# Patient Record
Sex: Female | Born: 1950 | Race: White | Hispanic: No | Marital: Married | Smoking: Never smoker
Health system: Southern US, Community
[De-identification: ages and names within clinical notes are randomized; demographics above are authoritative.]

## PROBLEM LIST (undated history)

## (undated) DIAGNOSIS — K219 Gastro-esophageal reflux disease without esophagitis: Secondary | ICD-10-CM

## (undated) DIAGNOSIS — Z9889 Other specified postprocedural states: Secondary | ICD-10-CM

## (undated) DIAGNOSIS — T7840XA Allergy, unspecified, initial encounter: Secondary | ICD-10-CM

## (undated) DIAGNOSIS — D219 Benign neoplasm of connective and other soft tissue, unspecified: Secondary | ICD-10-CM

## (undated) DIAGNOSIS — M1990 Unspecified osteoarthritis, unspecified site: Secondary | ICD-10-CM

## (undated) DIAGNOSIS — G500 Trigeminal neuralgia: Secondary | ICD-10-CM

## (undated) DIAGNOSIS — R112 Nausea with vomiting, unspecified: Secondary | ICD-10-CM

## (undated) DIAGNOSIS — N3010 Interstitial cystitis (chronic) without hematuria: Secondary | ICD-10-CM

## (undated) DIAGNOSIS — H9319 Tinnitus, unspecified ear: Secondary | ICD-10-CM

## (undated) HISTORY — DX: Interstitial cystitis (chronic) without hematuria: N30.10

## (undated) HISTORY — DX: Trigeminal neuralgia: G50.0

## (undated) HISTORY — DX: Allergy, unspecified, initial encounter: T78.40XA

## (undated) HISTORY — DX: Unspecified osteoarthritis, unspecified site: M19.90

## (undated) HISTORY — DX: Benign neoplasm of connective and other soft tissue, unspecified: D21.9

---

## 1978-11-27 HISTORY — PX: SEPTOPLASTY: SUR1290

## 1988-11-26 HISTORY — PX: OTHER SURGICAL HISTORY: SHX169

## 1997-08-27 ENCOUNTER — Other Ambulatory Visit: Admission: RE | Admit: 1997-08-27 | Discharge: 1997-08-27 | Payer: Self-pay | Admitting: Obstetrics and Gynecology

## 1997-09-19 ENCOUNTER — Ambulatory Visit (HOSPITAL_COMMUNITY): Admission: RE | Admit: 1997-09-19 | Discharge: 1997-09-19 | Payer: Self-pay | Admitting: Obstetrics and Gynecology

## 1998-03-28 HISTORY — PX: ABDOMINAL HYSTERECTOMY: SHX81

## 1998-09-18 ENCOUNTER — Other Ambulatory Visit: Admission: RE | Admit: 1998-09-18 | Discharge: 1998-09-18 | Payer: Self-pay | Admitting: Obstetrics and Gynecology

## 1998-11-03 ENCOUNTER — Encounter (INDEPENDENT_AMBULATORY_CARE_PROVIDER_SITE_OTHER): Payer: Self-pay | Admitting: Specialist

## 1998-11-03 ENCOUNTER — Inpatient Hospital Stay (HOSPITAL_COMMUNITY): Admission: RE | Admit: 1998-11-03 | Discharge: 1998-11-06 | Payer: Self-pay | Admitting: Obstetrics and Gynecology

## 2000-05-01 ENCOUNTER — Ambulatory Visit (HOSPITAL_COMMUNITY): Admission: RE | Admit: 2000-05-01 | Discharge: 2000-05-01 | Payer: Self-pay | Admitting: Gastroenterology

## 2000-12-08 ENCOUNTER — Ambulatory Visit (HOSPITAL_COMMUNITY): Admission: RE | Admit: 2000-12-08 | Discharge: 2000-12-08 | Payer: Self-pay | Admitting: Internal Medicine

## 2000-12-11 ENCOUNTER — Ambulatory Visit (HOSPITAL_COMMUNITY): Admission: RE | Admit: 2000-12-11 | Discharge: 2000-12-11 | Payer: Self-pay | Admitting: Internal Medicine

## 2003-09-05 ENCOUNTER — Emergency Department (HOSPITAL_COMMUNITY): Admission: EM | Admit: 2003-09-05 | Discharge: 2003-09-06 | Payer: Self-pay | Admitting: Emergency Medicine

## 2007-08-15 DIAGNOSIS — Z87898 Personal history of other specified conditions: Secondary | ICD-10-CM | POA: Insufficient documentation

## 2007-08-15 DIAGNOSIS — R05 Cough: Secondary | ICD-10-CM

## 2007-08-15 DIAGNOSIS — K219 Gastro-esophageal reflux disease without esophagitis: Secondary | ICD-10-CM | POA: Insufficient documentation

## 2007-08-15 DIAGNOSIS — R059 Cough, unspecified: Secondary | ICD-10-CM | POA: Insufficient documentation

## 2007-08-16 ENCOUNTER — Ambulatory Visit: Payer: Self-pay | Admitting: Emergency Medicine

## 2007-08-16 DIAGNOSIS — J309 Allergic rhinitis, unspecified: Secondary | ICD-10-CM | POA: Insufficient documentation

## 2007-11-28 ENCOUNTER — Ambulatory Visit: Payer: Self-pay | Admitting: Emergency Medicine

## 2007-12-04 ENCOUNTER — Ambulatory Visit: Payer: Self-pay | Admitting: Emergency Medicine

## 2007-12-14 ENCOUNTER — Encounter: Admission: RE | Admit: 2007-12-14 | Discharge: 2007-12-14 | Payer: Self-pay | Admitting: Unknown Physician Specialty

## 2010-02-22 ENCOUNTER — Encounter: Admission: RE | Admit: 2010-02-22 | Discharge: 2010-02-22 | Payer: Self-pay | Admitting: Allergy

## 2010-03-08 ENCOUNTER — Emergency Department (HOSPITAL_COMMUNITY)
Admission: EM | Admit: 2010-03-08 | Discharge: 2010-03-08 | Payer: Self-pay | Source: Home / Self Care | Admitting: Emergency Medicine

## 2010-03-28 HISTORY — PX: ETHMOIDECTOMY: SHX5197

## 2011-02-28 ENCOUNTER — Other Ambulatory Visit (HOSPITAL_COMMUNITY): Payer: Self-pay | Admitting: *Deleted

## 2011-03-03 ENCOUNTER — Encounter (HOSPITAL_COMMUNITY): Payer: Self-pay

## 2011-03-09 ENCOUNTER — Encounter (HOSPITAL_COMMUNITY): Payer: Self-pay

## 2011-03-11 ENCOUNTER — Ambulatory Visit (HOSPITAL_COMMUNITY)
Admission: RE | Admit: 2011-03-11 | Discharge: 2011-03-11 | Disposition: A | Discharge status: Home / Self Care | Payer: PRIVATE HEALTH INSURANCE | Source: Ambulatory Visit | Attending: Family Medicine | Admitting: Family Medicine

## 2011-03-11 ENCOUNTER — Encounter (HOSPITAL_COMMUNITY): Payer: Self-pay

## 2011-03-11 DIAGNOSIS — M810 Age-related osteoporosis without current pathological fracture: Secondary | ICD-10-CM | POA: Insufficient documentation

## 2011-03-11 MED ORDER — ZOLEDRONIC ACID 5 MG/100ML IV SOLN: 5.0000 mg | Freq: Once | INTRAVENOUS | Status: DC

## 2011-03-11 NOTE — Progress Notes (Signed)
PT decided that after hearing reclast can cause body aches that she does not want to take the medicine.  Called Dr. Kathi Der office and spoke with Dierdre Highman who then spoke with Dr. Modesto Charon who stated to let the pt know to call Tammy in the office and they will see what else they can do for her.  I updated the pt and she said she would call the office when she left.

## 2011-03-25 ENCOUNTER — Other Ambulatory Visit: Payer: Self-pay | Admitting: Allergy and Immunology

## 2011-03-25 ENCOUNTER — Ambulatory Visit
Admission: RE | Admit: 2011-03-25 | Discharge: 2011-03-25 | Disposition: A | Discharge status: Home / Self Care | Payer: PRIVATE HEALTH INSURANCE | Source: Ambulatory Visit | Attending: Allergy and Immunology | Admitting: Allergy and Immunology

## 2011-03-25 DIAGNOSIS — R05 Cough: Secondary | ICD-10-CM

## 2011-03-25 DIAGNOSIS — R059 Cough, unspecified: Secondary | ICD-10-CM

## 2011-05-13 ENCOUNTER — Other Ambulatory Visit: Payer: Self-pay | Admitting: Allergy and Immunology

## 2011-05-13 ENCOUNTER — Ambulatory Visit
Admission: RE | Admit: 2011-05-13 | Discharge: 2011-05-13 | Disposition: A | Discharge status: Home / Self Care | Payer: PRIVATE HEALTH INSURANCE | Source: Ambulatory Visit | Attending: Allergy and Immunology | Admitting: Allergy and Immunology

## 2011-05-13 DIAGNOSIS — J329 Chronic sinusitis, unspecified: Secondary | ICD-10-CM

## 2011-06-06 ENCOUNTER — Encounter: Payer: Self-pay | Admitting: Neurology

## 2011-06-23 ENCOUNTER — Encounter (HOSPITAL_COMMUNITY): Payer: Self-pay | Admitting: Emergency Medicine

## 2011-06-23 ENCOUNTER — Emergency Department (HOSPITAL_COMMUNITY)
Admission: EM | Admit: 2011-06-23 | Discharge: 2011-06-23 | Disposition: A | Discharge status: Home / Self Care | Payer: Medicare Other | Attending: Emergency Medicine | Admitting: Emergency Medicine

## 2011-06-23 DIAGNOSIS — K219 Gastro-esophageal reflux disease without esophagitis: Secondary | ICD-10-CM | POA: Insufficient documentation

## 2011-06-23 DIAGNOSIS — K5289 Other specified noninfective gastroenteritis and colitis: Secondary | ICD-10-CM | POA: Insufficient documentation

## 2011-06-23 DIAGNOSIS — J45909 Unspecified asthma, uncomplicated: Secondary | ICD-10-CM | POA: Insufficient documentation

## 2011-06-23 DIAGNOSIS — M810 Age-related osteoporosis without current pathological fracture: Secondary | ICD-10-CM | POA: Insufficient documentation

## 2011-06-23 DIAGNOSIS — E876 Hypokalemia: Secondary | ICD-10-CM | POA: Insufficient documentation

## 2011-06-23 DIAGNOSIS — K529 Noninfective gastroenteritis and colitis, unspecified: Secondary | ICD-10-CM

## 2011-06-23 DIAGNOSIS — D72829 Elevated white blood cell count, unspecified: Secondary | ICD-10-CM | POA: Insufficient documentation

## 2011-06-23 HISTORY — DX: Gastro-esophageal reflux disease without esophagitis: K21.9

## 2011-06-23 LAB — COMPREHENSIVE METABOLIC PANEL
ALT: 14 U/L (ref 0–35)
Albumin: 4.5 g/dL (ref 3.5–5.2)
Alkaline Phosphatase: 80 U/L (ref 39–117)
BUN: 15 mg/dL (ref 6–23)
Calcium: 10.1 mg/dL (ref 8.4–10.5)
GFR calc Af Amer: >90 mL/min (ref >90)
Potassium: 3.2 mEq/L — ABNORMAL LOW (ref 3.5–5.1)
Sodium: 140 mEq/L (ref 135–145)
Total Protein: 7.6 g/dL (ref 6.0–8.3)

## 2011-06-23 LAB — CBC
MCH: 29.5 pg (ref 26.0–34.0)
MCHC: 32.2 g/dL (ref 30.0–36.0)
MCV: 91.6 fL (ref 78.0–100.0)
Platelets: 195 10*3/uL (ref 150–400)

## 2011-06-23 LAB — DIFFERENTIAL
Basophils Relative: 0 % (ref 0–1)
Eosinophils Absolute: 0.3 10*3/uL (ref 0.0–0.7)
Eosinophils Relative: 2 % (ref 0–5)
Neutrophils Relative %: 83 % — ABNORMAL HIGH (ref 43–77)

## 2011-06-23 LAB — LIPASE, BLOOD: Lipase: 49 U/L (ref 11–59)

## 2011-06-23 MED ORDER — LOPERAMIDE HCL 2 MG PO CAPS: 2.0000 mg | ORAL_CAPSULE | Freq: Four times a day (QID) | ORAL | Status: AC | PRN

## 2011-06-23 MED ORDER — ONDANSETRON HCL 4 MG/2ML IJ SOLN
4.0000 mg | Freq: Once | INTRAMUSCULAR | Status: AC
  Administered 2011-06-23: 4 mg via INTRAVENOUS
  Filled 2011-06-23: qty 2

## 2011-06-23 MED ORDER — SODIUM CHLORIDE 0.9 % IV BOLUS (SEPSIS)
1000.0000 mL | Freq: Once | INTRAVENOUS | Status: AC
  Administered 2011-06-23: 1000 mL via INTRAVENOUS

## 2011-06-23 MED ORDER — ONDANSETRON HCL 4 MG PO TABS: 4.0000 mg | ORAL_TABLET | Freq: Four times a day (QID) | ORAL | Status: AC

## 2011-06-23 MED ORDER — LOPERAMIDE HCL 2 MG PO CAPS
2.0000 mg | ORAL_CAPSULE | Freq: Once | ORAL | Status: AC
  Administered 2011-06-23: 2 mg via ORAL
  Filled 2011-06-23: qty 1

## 2011-06-23 NOTE — ED Provider Notes (Signed)
History     CSN: [id number]  Arrival date & time 06/23/11  0009   First MD Initiated Contact with Patient 06/23/11 0118      Chief Complaint  Patient presents with  . Emesis  . Diarrhea    (Consider location/radiation/quality/duration/timing/severity/associated sxs/prior treatment) HPI Comments: Patient here after developing acute onset of nausea, vomiting, diarrhea and crampy abdominal pain - states she is staying upstairs with her husband who is undergoing chemotherapy - states that she felt well today, ate well.  States this evening she was in the sleeping chair when she felt a wave of nausea and went to the bathroom and vomited NBNB vomitus - she reports felt a little better and went back to the chair when another wave hit her and she vomited again.  She states that she then left the room, talked to the nurse upstairs and went to a different bathroom, states had one normal bowel movement, then proceeded to have diarrhea - she denies fever but reports feeling chilled.  Reports diffuse crampy abdominal pain just before episodes of vomiting or diarrhea - she denies any real sick contacts and no one else in the family is ill.  Patient is a 61 y.o. female presenting with vomiting and diarrhea. The history is provided by the patient. No language interpreter was used.  Emesis  This is a new problem. The current episode started 1 to 2 hours ago. The problem occurs 2 to 4 times per day. The problem has not changed since onset.The emesis has an appearance of stomach contents. There has been no fever. Associated symptoms include abdominal pain, chills and diarrhea. Pertinent negatives include no arthralgias, no cough, no fever, no headaches, no myalgias, no sweats and no URI.  Diarrhea The primary symptoms include abdominal pain, nausea, vomiting and diarrhea. Primary symptoms do not include fever, weight loss, fatigue, melena, hematemesis, jaundice, hematochezia, dysuria, myalgias, arthralgias or  rash. The illness began today. The onset was sudden. The problem has not changed since onset. The illness is also significant for chills.    Past Medical History  Diagnosis Date  . Asthma   . Osteoporosis   . Acid reflux     Past Surgical History  Procedure Date  . Abdominal hysterectomy   . Vocal cord surgery    . Septoidectomy    . Ethmoidectomy   . Septoplasty     Family History  Problem Relation Age of Onset  . Hyperlipidemia Mother   . Cancer Father     History  Substance Use Topics  . Smoking status: Never Smoker   . Smokeless tobacco: Not on file  . Alcohol Use: No    OB History    Grav Para Term Preterm Abortions TAB SAB Ect Mult Living                  Review of Systems  Constitutional: Positive for chills. Negative for fever, weight loss and fatigue.  Respiratory: Negative for cough.   Gastrointestinal: Positive for nausea, vomiting, abdominal pain and diarrhea. Negative for melena, hematochezia, hematemesis and jaundice.  Genitourinary: Negative for dysuria.  Musculoskeletal: Negative for myalgias and arthralgias.  Skin: Negative for rash.  Neurological: Negative for headaches.  All other systems reviewed and are negative.    Allergies  Avelox; Clindamycin/lincomycin; Levaquin; and Sulfonamide derivatives  Home Medications   Current Outpatient Rx  Name Route Sig Dispense Refill  . CITRACAL CALCIUM+D PO Oral Take by mouth.    [name] FLUTICASONE PROPIONATE 50 MCG/ACT  NA SUSP Nasal Place 2 sprays into the nose daily.    . IBUPROFEN 200 MG PO TABS Oral Take 400 mg by mouth every 6 (six) hours as needed.    [name] PANTOPRAZOLE SODIUM 40 MG PO TBEC Oral Take 40 mg by mouth daily.      BP 106/77  Pulse 82  Temp(Src) 97.8 F (36.6 C) (Oral)  Resp 20  SpO2 100%  Physical Exam  Nursing note and vitals reviewed. Constitutional: She is oriented to person, place, and time. She appears well-developed and well-nourished. No distress.  HENT:  Head:  Normocephalic and atraumatic.  Right Ear: External ear normal.  Left Ear: External ear normal.  Nose: Nose normal.  Mouth/Throat: Oropharynx is clear and moist. No oropharyngeal exudate.  Eyes: Conjunctivae are normal. Pupils are equal, round, and reactive to light. No scleral icterus.  Neck: Normal range of motion. Neck supple.  Cardiovascular: Normal rate, regular rhythm and normal heart sounds.  Exam reveals no gallop and no friction rub.   No murmur heard. Pulmonary/Chest: Effort normal and breath sounds normal. No respiratory distress. She exhibits no tenderness.  Abdominal: Soft. Bowel sounds are normal. She exhibits no distension. There is no tenderness.  Musculoskeletal: Normal range of motion. She exhibits no edema and no tenderness.  Lymphadenopathy:    She has no cervical adenopathy.  Neurological: She is alert and oriented to person, place, and time. No cranial nerve deficit.  Skin: Skin is warm and dry. No rash noted. No erythema. No pallor.  Psychiatric: She has a normal mood and affect. Her behavior is normal. Judgment and thought content normal.    ED Course  Procedures (including critical care time)   Labs Reviewed  CBC  DIFFERENTIAL  COMPREHENSIVE METABOLIC PANEL  LIPASE, BLOOD   No results found. Results for orders placed during the hospital encounter of 06/23/11  CBC      Component Value Range   WBC 12.2 (*) 4.0 - 10.5 (K/uL)   RBC 4.04  3.87 - 5.11 (MIL/uL)   Hemoglobin 11.9 (*) 12.0 - 15.0 (g/dL)   HCT 62.9  52.8 - 41.3 (%)   MCV 91.6  78.0 - 100.0 (fL)   MCH 29.5  26.0 - 34.0 (pg)   MCHC 32.2  30.0 - 36.0 (g/dL)   RDW 24.4  01.0 - 27.2 (%)   Platelets 195  150 - 400 (K/uL)  DIFFERENTIAL      Component Value Range   Neutrophils Relative 83 (*) 43 - 77 (%)   Neutro Abs 10.1 (*) 1.7 - 7.7 (K/uL)   Lymphocytes Relative 6 (*) 12 - 46 (%)   Lymphs Abs 0.8  0.7 - 4.0 (K/uL)   Monocytes Relative 8  3 - 12 (%)   Monocytes Absolute 1.0  0.1 - 1.0 (K/uL)     Eosinophils Relative 2  0 - 5 (%)   Eosinophils Absolute 0.3  0.0 - 0.7 (K/uL)   Basophils Relative 0  0 - 1 (%)   Basophils Absolute 0.0  0.0 - 0.1 (K/uL)  COMPREHENSIVE METABOLIC PANEL      Component Value Range   Sodium 140  135 - 145 (mEq/L)   Potassium 3.2 (*) 3.5 - 5.1 (mEq/L)   Chloride 102  96 - 112 (mEq/L)   CO2 25  19 - 32 (mEq/L)   Glucose, Bld 123 (*) 70 - 99 (mg/dL)   BUN 15  6 - 23 (mg/dL)   Creatinine, Ser 5.36  0.50 - 1.10 (mg/dL)  Calcium 10.1  8.4 - 10.5 (mg/dL)   Total Protein 7.6  6.0 - 8.3 (g/dL)   Albumin 4.5  3.5 - 5.2 (g/dL)   AST 28  0 - 37 (U/L)   ALT 14  0 - 35 (U/L)   Alkaline Phosphatase 80  39 - 117 (U/L)   Total Bilirubin 0.3  0.3 - 1.2 (mg/dL)   GFR calc non Af Amer >90  >90 (mL/min)   GFR calc Af Amer >90  >90 (mL/min)  LIPASE, BLOOD      Component Value Range   Lipase 49  11 - 59 (U/L)   No results found.   Gastroenteritis    MDM  Patient with basically normal labs with the exception of mild leukocytosis and hypokalemia.  Patient with only crampy abdominal pain with a soft and benign abdominal examination so I do not feel that imaging is warranted.  Patient reports feeling better after IV fluids and zofran for nausea - repeat abdominal exam without tenderness.        Izola Price [place], [place] 06/23/11 [phone]

## 2011-06-23 NOTE — Discharge Instructions (Signed)
Diet for Diarrhea, Adult Having frequent, runny stools (diarrhea) has many causes. Diarrhea may be caused or worsened by food or drink. Diarrhea may be relieved by changing your diet. IF YOU ARE NOT TOLERATING SOLID FOODS:  Drink enough water and fluids to keep your urine clear or pale yellow.   Avoid sugary drinks and sodas as well as milk-based beverages.   Avoid beverages containing caffeine and alcohol.   You may try rehydrating beverages. You can make your own by following this recipe:    tsp table salt.    tsp baking soda.   ? tsp salt substitute (potassium chloride).   1 tbs + 1 tsp sugar.   1 qt water.  As your stools become more solid, you can start eating solid foods. Add foods one at a time. If a certain food causes your diarrhea to get worse, avoid that food and try other foods. A low fiber, low-fat, and lactose-free diet is recommended. Small, frequent meals may be better tolerated.  Starches  Allowed:  White, French, and pita breads, plain rolls, buns, bagels. Plain muffins, matzo. Soda, saltine, or graham crackers. Pretzels, melba toast, zwieback. Cooked cereals made with water: cornmeal, farina, cream cereals. Dry cereals: refined corn, wheat, rice. Potatoes prepared any way without skins, refined macaroni, spaghetti, noodles, refined rice.   Avoid:  Bread, rolls, or crackers made with whole wheat, multi-grains, rye, bran seeds, nuts, or coconut. Corn tortillas or taco shells. Cereals containing whole grains, multi-grains, bran, coconut, nuts, or raisins. Cooked or dry oatmeal. Coarse wheat cereals, granola. Cereals advertised as "high-fiber." Potato skins. Whole grain pasta, wild or brown rice. Popcorn. Sweet potatoes/yams. Sweet rolls, doughnuts, waffles, pancakes, sweet breads.  Vegetables  Allowed: Strained tomato and vegetable juices. Most well-cooked and canned vegetables without seeds. Fresh: Tender lettuce, cucumber without the skin, cabbage, spinach, bean  sprouts.   Avoid: Fresh, cooked, or canned: Artichokes, baked beans, beet greens, broccoli, Brussels sprouts, corn, kale, legumes, peas, sweet potatoes. Cooked: Green or red cabbage, spinach. Avoid large servings of any vegetables, because vegetables shrink when cooked, and they contain more fiber per serving than fresh vegetables.  Fruit  Allowed: All fruit juices except prune juice. Cooked or canned: Apricots, applesauce, cantaloupe, cherries, fruit cocktail, grapefruit, grapes, kiwi, mandarin oranges, peaches, pears, plums, watermelon. Fresh: Apples without skin, ripe banana, grapes, cantaloupe, cherries, grapefruit, peaches, oranges, plums. Keep servings limited to  cup or 1 piece.   Avoid: Fresh: Apple with skin, apricots, mango, pears, raspberries, strawberries. Prune juice, stewed or dried prunes. Dried fruits, raisins, dates. Large servings of all fresh fruits.  Meat and Meat Substitutes  Allowed: Ground or well-cooked tender beef, ham, veal, lamb, pork, or poultry. Eggs, plain cheese. Fish, oysters, shrimp, lobster, other seafoods. Liver, organ meats.   Avoid: Tough, fibrous meats with gristle. Peanut butter, smooth or chunky. Cheese, nuts, seeds, legumes, dried peas, beans, lentils.  Milk  Allowed: Yogurt, lactose-free milk, kefir, drinkable yogurt, buttermilk, soy milk.   Avoid: Milk, chocolate milk, beverages made with milk, such as milk shakes.  Soups  Allowed: Bouillon, broth, or soups made from allowed foods. Any strained soup.   Avoid: Soups made from vegetables that are not allowed, cream or milk-based soups.  Desserts and Sweets  Allowed: Sugar-free gelatin, sugar-free frozen ice pops made without sugar alcohol.   Avoid: Plain cakes and cookies, pie made with allowed fruit, pudding, custard, cream pie. Gelatin, fruit, ice, sherbet, frozen ice pops. Ice cream, ice milk without nuts. Plain hard candy,   honey, jelly, molasses, syrup, sugar, chocolate syrup, gumdrops,  marshmallows.  Fats and Oils  Allowed: Avoid any fats and oils.   Avoid: Seeds, nuts, olives, avocados. Margarine, butter, cream, mayonnaise, salad oils, plain salad dressings made from allowed foods. Plain gravy, crisp bacon without rind.  Beverages  Allowed: Water, decaffeinated teas, oral rehydration solutions, sugar-free beverages.   Avoid: Fruit juices, caffeinated beverages (coffee, tea, soda or pop), alcohol, sports drinks, or lemon-lime soda or pop.  Condiments  Allowed: Ketchup, mustard, horseradish, vinegar, cream sauce, cheese sauce, cocoa powder. Spices in moderation: allspice, basil, bay leaves, celery powder or leaves, cinnamon, cumin powder, curry powder, ginger, mace, marjoram, onion or garlic powder, oregano, paprika, parsley flakes, ground pepper, rosemary, sage, savory, tarragon, thyme, turmeric.   Avoid: Coconut, honey.  Weight Monitoring: Weigh yourself every day. You should weigh yourself in the morning after you urinate and before you eat breakfast. Wear the same amount of clothing when you weigh yourself. Record your weight daily. Bring your recorded weights to your clinic visits. Tell your caregiver right away if you have gained 3 lb/1.4 kg or more in 1 day, 5 lb/2.3 kg in a week, or whatever amount you were told to report. SEEK IMMEDIATE MEDICAL CARE IF:   You are unable to keep fluids down.   You start to throw up (vomit) or diarrhea keeps coming back (persistent).   Abdominal pain develops, increases, or can be felt in one place (localizes).   You have an oral temperature above 102 F (38.9 C), not controlled by medicine.   Diarrhea contains blood or mucus.   You develop excessive weakness, dizziness, fainting, or extreme thirst.  MAKE SURE YOU:   Understand these instructions.   Will watch your condition.   Will get help right away if you are not doing well or get worse.  Document Released: 06/04/2003 Document Revised: 03/03/2011 Document Reviewed:  09/25/2008 [facility] Patient Information 2012 [place], [place].Clear Liquid Diet The clear liquid dietconsists of foods that are liquid or will become liquid at room temperature.You should be able to see through the liquid and beverages. Examples of foods allowed on a clear liquid diet include fruit juice, broth or bouillon, gelatin, or frozen ice pops. The purpose of this diet is to provide necessary fluid, electrolytes such as sodium and potassium, and energy to keep the body functioning during times when you are not able to consume a regular diet.A clear liquid diet should not be continued for long periods of time as it is not nutritionally adequate.  REASONS FOR USING A CLEAR LIQUID DIET  In sudden onset (acute) conditions for a patient before or after surgery.   As the first step in oral feeding.   For fluid and electrolyte replacement in diarrheal diseases.   As a diet before certain medical tests are performed.  ADEQUACY The clear liquid diet is adequate only in ascorbic acid, according to the Recommended Dietary Allowances of the [employer]. CHOOSING FOODS Breads and Starches  Allowed:  None are allowed.   Avoid: All are avoided.  Vegetables  Allowed:  Strained tomato or vegetable juice.   Avoid: Any others.  Fruit  Allowed:  Strained fruit juices and fruit drinks. Include 1 serving of citrus or vitamin C-enriched fruit juice daily.   Avoid: Any others.  Meat and Meat Substitutes  Allowed:  None are allowed.   Avoid: All are avoided.  Milk  Allowed:  None are allowed.   Avoid: All are avoided.  Soups and  Combination Foods  Allowed:  Clear bouillon, broth, or strained broth-based soups.   Avoid: Any others.  Desserts and Sweets  Allowed:  Sugar, honey. High protein gelatin. Flavored gelatin, ices, or frozen ice pops that do not contain milk.   Avoid: Any others.  Fats and Oils  Allowed:  None are allowed.   Avoid: All are avoided.    Beverages  Allowed: Cereal beverages, coffee (regular or decaffeinated), tea, or soda at the discretion of your caregiver.   Avoid: Any others.  Condiments  Allowed:  Iodized salt.   Avoid: Any others, including pepper.  Supplements  Allowed:  Liquid nutrition beverages.   Avoid: Any others that contain lactose or fiber.  SAMPLE MEAL PLAN Breakfast  4 oz (120 mL) strained orange juice.    to 1 cup (125 to 250 mL) gelatin (plain or fortified).   1 cup (250 mL) beverage (coffee or tea).   Sugar, if desired.  Midmorning Snack   cup (125 mL) gelatin (plain or fortified).  Lunch  1 cup (250 mL) broth or consomm.   4 oz (120 mL) strained grapefruit juice.    cup (125 mL) gelatin (plain or fortified).   1 cup (250 mL) beverage (coffee or tea).   Sugar, if desired.  Midafternoon Snack   cup (125 mL) fruit ice.    cup (125 mL) strained fruit juice.  Dinner  1 cup (250 mL) broth or consomm.    cup (125 mL) cranberry juice.    cup (125 mL) flavored gelatin (plain or fortified).   1 cup (250 mL) beverage (coffee or tea).   Sugar, if desired.  Evening Snack  4 oz (120 mL) strained apple juice (vitamin C-fortified).    cup (125 mL) flavored gelatin (plain or fortified).  Document Released: 03/14/2005 Document Revised: 03/03/2011 Document Reviewed: 06/11/2010 [facility] Patient Information 2012 [place], [place].Viral Gastroenteritis Viral gastroenteritis is also called stomach flu. This illness is caused by a certain type of germ (virus). It can cause sudden watery poop (diarrhea) and throwing up (vomiting). This can cause you to lose body fluids (dehydration). This illness usually lasts for 3 to 8 days. It usually goes away on its own. HOME CARE   Drink enough fluids to keep your pee (urine) clear or pale yellow. Drink small amounts of fluids often.   Ask your doctor how to replace body fluid losses (rehydration).   Avoid:   Foods high in sugar.    Alcohol.   Bubbly (carbonated) drinks.   Tobacco.   Juice.   Caffeine drinks.   Very hot or cold fluids.   Fatty, greasy foods.   Eating too much at one time.   Dairy products until 24 to 48 hours after your watery poop stops.   You may eat foods with active cultures (probiotics). They can be found in some yogurts and supplements.   Wash your hands well to avoid spreading the illness.   Only take medicines as told by your doctor. Do not give aspirin to children. Do not take medicines for watery poop (antidiarrheals).   Ask your doctor if you should keep taking your regular medicines.   Keep all doctor visits as told.  GET HELP RIGHT AWAY IF:   You cannot keep fluids down.   You do not pee at least once every 6 to 8 hours.   You are short of breath.   You see blood in your poop or throw up. This may look like coffee grounds.  You have belly (abdominal) pain that gets worse or is just in one small spot (localized).   You keep throwing up or having watery poop.   You have a fever.   The patient is a child younger than 3 months, and he or she has a fever.   The patient is a child older than 3 months, and he or she has a fever and problems that do not go away.   The patient is a child older than 3 months, and he or she has a fever and problems that suddenly get worse.   The patient is a baby, and he or she has no tears when crying.  MAKE SURE YOU:   Understand these instructions.   Will watch your condition.   Will get help right away if you are not doing well or get worse.  Document Released: 08/31/2007 Document Revised: 03/03/2011 Document Reviewed: 12/29/2010 [facility] Patient Information 2012 [place], [place].

## 2011-06-23 NOTE — ED Notes (Signed)
Pt states tonight around 9pm she started having nausea, vomiting, and diarrhea

## 2011-06-23 NOTE — ED Provider Notes (Signed)
Medical screening examination/treatment/procedure(s) were performed by non-physician practitioner and as supervising physician I was immediately available for consultation/collaboration.  Jasmine Awe, MD 06/23/11 [phone]

## 2011-07-18 ENCOUNTER — Ambulatory Visit: Payer: PRIVATE HEALTH INSURANCE | Admitting: Neurology

## 2012-05-16 ENCOUNTER — Other Ambulatory Visit: Payer: Self-pay | Admitting: Urology

## 2012-05-17 MED ORDER — TRIAMCINOLONE ACETONIDE 40 MG/ML IJ SUSP: Freq: Once | INTRAMUSCULAR | Status: DC

## 2012-05-17 MED ORDER — PHENAZOPYRIDINE HCL 200 MG PO TABS: Freq: Once | ORAL | Status: DC

## 2012-06-05 ENCOUNTER — Encounter (HOSPITAL_BASED_OUTPATIENT_CLINIC_OR_DEPARTMENT_OTHER): Payer: Self-pay | Admitting: *Deleted

## 2012-06-05 NOTE — Progress Notes (Addendum)
To [facility] at 1000-Hg,Ekg(none on file) on arrival.Npo after Mn-instructed to take protonix with small amt water only that am.

## 2012-06-11 ENCOUNTER — Ambulatory Visit (HOSPITAL_BASED_OUTPATIENT_CLINIC_OR_DEPARTMENT_OTHER): Payer: PRIVATE HEALTH INSURANCE | Admitting: Anesthesiology

## 2012-06-11 ENCOUNTER — Encounter (HOSPITAL_BASED_OUTPATIENT_CLINIC_OR_DEPARTMENT_OTHER): Payer: Self-pay

## 2012-06-11 ENCOUNTER — Encounter (HOSPITAL_BASED_OUTPATIENT_CLINIC_OR_DEPARTMENT_OTHER): Payer: Self-pay | Admitting: Anesthesiology

## 2012-06-11 ENCOUNTER — Ambulatory Visit (HOSPITAL_BASED_OUTPATIENT_CLINIC_OR_DEPARTMENT_OTHER)
Admission: RE | Admit: 2012-06-11 | Discharge: 2012-06-11 | Disposition: A | Discharge status: Home / Self Care | Payer: PRIVATE HEALTH INSURANCE | Source: Ambulatory Visit | Attending: Urology | Admitting: Urology

## 2012-06-11 ENCOUNTER — Encounter (HOSPITAL_BASED_OUTPATIENT_CLINIC_OR_DEPARTMENT_OTHER)
Admission: RE | Disposition: A | Discharge status: Home / Self Care | Payer: Self-pay | Source: Ambulatory Visit | Attending: Urology

## 2012-06-11 DIAGNOSIS — J45909 Unspecified asthma, uncomplicated: Secondary | ICD-10-CM | POA: Insufficient documentation

## 2012-06-11 DIAGNOSIS — Z79899 Other long term (current) drug therapy: Secondary | ICD-10-CM | POA: Insufficient documentation

## 2012-06-11 DIAGNOSIS — K219 Gastro-esophageal reflux disease without esophagitis: Secondary | ICD-10-CM | POA: Insufficient documentation

## 2012-06-11 DIAGNOSIS — N3010 Interstitial cystitis (chronic) without hematuria: Secondary | ICD-10-CM | POA: Insufficient documentation

## 2012-06-11 DIAGNOSIS — Z01810 Encounter for preprocedural cardiovascular examination: Secondary | ICD-10-CM | POA: Insufficient documentation

## 2012-06-11 HISTORY — DX: Tinnitus, unspecified ear: H93.19

## 2012-06-11 HISTORY — DX: Other specified postprocedural states: Z98.890

## 2012-06-11 HISTORY — DX: Nausea with vomiting, unspecified: R11.2

## 2012-06-11 HISTORY — PX: CYSTOSCOPY WITH HYDRODISTENSION AND BIOPSY: SHX5127

## 2012-06-11 LAB — POCT HEMOGLOBIN-HEMACUE: Hemoglobin: 12 g/dL (ref 12.0–15.0)

## 2012-06-11 SURGERY — CYSTOSCOPY, WITH BLADDER HYDRODISTENSION AND BIOPSY
Anesthesia: General | Site: Bladder | Wound class: Clean Contaminated

## 2012-06-11 MED ORDER — CEFAZOLIN SODIUM 1-5 GM-% IV SOLN
1.0000 g | INTRAVENOUS | Status: DC
  Filled 2012-06-11: qty 50

## 2012-06-11 MED ORDER — TRIAMCINOLONE ACETONIDE 40 MG/ML IJ SUSP
INTRAMUSCULAR | Status: DC | PRN
  Administered 2012-06-11: 40 mg via INTRAMUSCULAR

## 2012-06-11 MED ORDER — KETOROLAC TROMETHAMINE 30 MG/ML IJ SOLN
INTRAMUSCULAR | Status: DC | PRN
  Administered 2012-06-11: 30 mg via INTRAVENOUS

## 2012-06-11 MED ORDER — STERILE WATER FOR IRRIGATION IR SOLN
Status: DC | PRN
  Administered 2012-06-11: 1000 mL

## 2012-06-11 MED ORDER — POLYETHYLENE GLYCOL 3350 17 G PO PACK: PACK | ORAL | Status: DC

## 2012-06-11 MED ORDER — MIDAZOLAM HCL 5 MG/5ML IJ SOLN
INTRAMUSCULAR | Status: DC | PRN
  Administered 2012-06-11: 2 mg via INTRAVENOUS

## 2012-06-11 MED ORDER — PENTOSAN POLYSULFATE SODIUM 100 MG PO CAPS: 100.0000 mg | ORAL_CAPSULE | Freq: Three times a day (TID) | ORAL | Status: DC

## 2012-06-11 MED ORDER — HYDROXYZINE HCL 10 MG PO TABS: 10.0000 mg | ORAL_TABLET | Freq: Three times a day (TID) | ORAL | Status: DC | PRN

## 2012-06-11 MED ORDER — DEXAMETHASONE SODIUM PHOSPHATE 10 MG/ML IJ SOLN
INTRAMUSCULAR | Status: DC | PRN
  Administered 2012-06-11: 8 mg via INTRAVENOUS

## 2012-06-11 MED ORDER — ACETAMINOPHEN 10 MG/ML IV SOLN
INTRAVENOUS | Status: DC | PRN
  Administered 2012-06-11: 1000 mg via INTRAVENOUS

## 2012-06-11 MED ORDER — TRIMETHOPRIM 100 MG PO TABS: 100.0000 mg | ORAL_TABLET | ORAL | Status: DC

## 2012-06-11 MED ORDER — URELLE 81 MG PO TABS: 1.0000 | ORAL_TABLET | Freq: Three times a day (TID) | ORAL | Status: DC

## 2012-06-11 MED ORDER — CIPROFLOXACIN IN D5W 400 MG/200ML IV SOLN
400.0000 mg | Freq: Two times a day (BID) | INTRAVENOUS | Status: DC
  Administered 2012-06-11: 400 mg via INTRAVENOUS
  Filled 2012-06-11: qty 200

## 2012-06-11 MED ORDER — CALCIUM GLYCEROPHOSPHATE 340 (65-50) MG (CA-P) PO TABS: 1.0000 | ORAL_TABLET | Freq: Three times a day (TID) | ORAL | Status: DC

## 2012-06-11 MED ORDER — LACTATED RINGERS IV SOLN
INTRAVENOUS | Status: DC
  Administered 2012-06-11 (×2): via INTRAVENOUS
  Filled 2012-06-11: qty 1000

## 2012-06-11 MED ORDER — CEFAZOLIN SODIUM-DEXTROSE 2-3 GM-% IV SOLR
2.0000 g | INTRAVENOUS | Status: DC
  Filled 2012-06-11: qty 50

## 2012-06-11 MED ORDER — LACTATED RINGERS IV SOLN
INTRAVENOUS | Status: DC
  Filled 2012-06-11: qty 1000

## 2012-06-11 MED ORDER — HYDROCODONE-IBUPROFEN 5-200 MG PO TABS: 1.0000 | ORAL_TABLET | Freq: Four times a day (QID) | ORAL | Status: DC | PRN

## 2012-06-11 MED ORDER — FENTANYL CITRATE 0.05 MG/ML IJ SOLN
25.0000 ug | INTRAMUSCULAR | Status: DC | PRN
  Filled 2012-06-11: qty 1

## 2012-06-11 MED ORDER — ONDANSETRON HCL 4 MG/2ML IJ SOLN
INTRAMUSCULAR | Status: DC | PRN
  Administered 2012-06-11: 4 mg via INTRAVENOUS

## 2012-06-11 MED ORDER — PROPOFOL 10 MG/ML IV BOLUS
INTRAVENOUS | Status: DC | PRN
  Administered 2012-06-11: 160 mg via INTRAVENOUS

## 2012-06-11 MED ORDER — LIDOCAINE HCL (CARDIAC) 20 MG/ML IV SOLN
INTRAVENOUS | Status: DC | PRN
  Administered 2012-06-11: 60 mg via INTRAVENOUS

## 2012-06-11 MED ORDER — FENTANYL CITRATE 0.05 MG/ML IJ SOLN
INTRAMUSCULAR | Status: DC | PRN
  Administered 2012-06-11: 50 ug via INTRAVENOUS

## 2012-06-11 MED ORDER — PROMETHAZINE HCL 25 MG/ML IJ SOLN
6.2500 mg | INTRAMUSCULAR | Status: DC | PRN
  Filled 2012-06-11: qty 1

## 2012-06-11 MED ORDER — PHENAZOPYRIDINE HCL 200 MG PO TABS
ORAL | Status: DC | PRN
  Administered 2012-06-11: 11:00:00 via INTRAVESICAL

## 2012-06-11 SURGICAL SUPPLY — 28 items
ADAPTER CATH WHT DISP STRL (CATHETERS) IMPLANT
ADPR CATH MP STRL LF DISP BD (CATHETERS)
BAG DRAIN URO-CYSTO SKYTR STRL (DRAIN) ×2 IMPLANT
BAG DRN UROCATH (DRAIN) ×1
BOOTIES KNEE HIGH SLOAN (MISCELLANEOUS) ×2 IMPLANT
CANISTER SUCT LVC 12 LTR MEDI- (MISCELLANEOUS) IMPLANT
CATH ROBINSON RED A/P 16FR (CATHETERS) IMPLANT
CLOTH BEACON ORANGE TIMEOUT ST (SAFETY) ×2 IMPLANT
DRAPE CAMERA CLOSED 9X96 (DRAPES) ×2 IMPLANT
ELECT REM PT RETURN 9FT ADLT (ELECTROSURGICAL) ×2
ELECTRODE REM PT RTRN 9FT ADLT (ELECTROSURGICAL) ×1 IMPLANT
GLOVE BIO SURGEON STRL SZ 6 (GLOVE) ×1 IMPLANT
GLOVE BIO SURGEON STRL SZ 6.5 (GLOVE) ×1 IMPLANT
GLOVE BIO SURGEON STRL SZ7.5 (GLOVE) ×2 IMPLANT
GLOVE ECLIPSE 8.0 STRL XLNG CF (GLOVE) ×1 IMPLANT
GOWN PREVENTION PLUS LG XLONG (DISPOSABLE) ×2 IMPLANT
GOWN STRL REIN XL XLG (GOWN DISPOSABLE) ×2 IMPLANT
NDL SAFETY ECLIPSE 18X1.5 (NEEDLE) ×1 IMPLANT
NDL SPNL 22GX7 QUINCKE BK (NEEDLE) IMPLANT
NEEDLE HYPO 18GX1.5 SHARP (NEEDLE) ×2
NEEDLE HYPO 22GX1.5 SAFETY (NEEDLE) ×1 IMPLANT
NEEDLE SPNL 22GX7 QUINCKE BK (NEEDLE) ×2 IMPLANT
NS IRRIG 500ML POUR BTL (IV SOLUTION) ×1 IMPLANT
PACK CYSTOSCOPY (CUSTOM PROCEDURE TRAY) ×2 IMPLANT
SYR 20CC LL (SYRINGE) ×4 IMPLANT
SYR BULB IRRIGATION 50ML (SYRINGE) IMPLANT
SYRINGE 10CC LL (SYRINGE) ×1 IMPLANT
WATER STERILE IRR 3000ML UROMA (IV SOLUTION) ×2 IMPLANT

## 2012-06-11 NOTE — Anesthesia Postprocedure Evaluation (Signed)
Anesthesia Post Note  Patient: Kristy Garza  Procedure(s) Performed: Procedure(s) (LRB): CYSTOSCOPY/BIOPSY/HYDRODISTENSION with Instillation of Pyridium and Marcaine and Kenalog (N/A)  Anesthesia type: General  Patient location: PACU  Post pain: Pain level controlled  Post assessment: Post-op Vital signs reviewed  Last Vitals:  Filed Vitals:   06/11/12 1100  BP: 109/73  Pulse: 60  Temp:   Resp: 12    Post vital signs: Reviewed  Level of consciousness: sedated  Complications: No apparent anesthesia complications

## 2012-06-11 NOTE — Transfer of Care (Signed)
Immediate Anesthesia Transfer of Care Note  Patient: Kristy Garza  Procedure(s) Performed: Procedure(s) (LRB): CYSTOSCOPY/BIOPSY/HYDRODISTENSION with Instillation of Pyridium and Marcaine and Kenalog (N/A)  Patient Location: PACU  Anesthesia Type: General  Level of Consciousness: awake, oriented, sedated and patient cooperative  Airway & Oxygen Therapy: Patient Spontanous Breathing and Patient connected to face mask oxygen  Post-op Assessment: Report given to PACU RN and Post -op Vital signs reviewed and stable  Post vital signs: Reviewed and stable  Complications: No apparent anesthesia complications

## 2012-06-11 NOTE — H&P (Signed)
hief Complaint     cc:  Dr. Orvan July [facility]   Active Problems Problems  1. Acute Cystitis 595.0 2. Dysuria 788.1 3. Gross Hematuria 599.71  History of Present Illness        62 yo female pt of Dr. Isabel Caprice, with hx of UTI, began to have burning in December, and has been treated with multiple antibiotics. she has seen Dr. Isabel Caprice in the past with dysuria, and poor flow, and poor emptying. She has been on a short course of prednisone this week. No dyspaurnea.    She has asthma, and allergies, GERD, and tinnitis, osteoporosis, and tingling in her feet. Poor sleep  at night, thought 2ndary to cough. Miralax for chronic constipation.   Past Medical History Problems  1. History of  Allergies V15.09 2. History of  Anxiety (Symptom) 300.00 3. History of  Arthritis V13.4 4. History of  Asthma 493.90 5. History of  Heartburn 787.1  Surgical History Problems  1. History of  ENT Surgical Result 2. History of  Hysterectomy V45.77 3. History of  Septoplasty  Current Meds 1. Advil TABS; Therapy: (Recorded:12Feb2014) to 2. Align CAPS; Therapy: (Recorded:14Jan2014) to 3. Amoxicillin 875 MG Oral Tablet; Therapy: 06Feb2014 to 4. Astelin 137 MCG/SPRAY Nasal Solution; Therapy: (Recorded:14Jan2014) to 5. AZO Standard Maximum Strength TABS; Therapy: (Recorded:28Jan2014) to 6. Citracal + D TABS; Therapy: (Recorded:14Jan2014) to 7. Delsym LQCR; Therapy: (Recorded:12Feb2014) to 8. Doxycycline Hyclate TABS; Therapy: (Recorded:14Jan2014) to 9. Flonase SUSP; Therapy: (Recorded:14Jan2014) to 10. Mucinex TBCR; Therapy: (Recorded:12Feb2014) to 11. Pantoprazole Sodium TBEC; Therapy: (Recorded:14Jan2014) to 12. ProAir HFA AERS; Therapy: (Recorded:14Jan2014) to 13. Qvar 40 MCG/ACT Inhalation Aerosol Solution; Therapy: 27Jan2014 to 14. Xanax 0.5 MG Oral Tablet; Therapy: (Recorded:14Jan2014) to  Allergies Medication  1. Avelox TABS 2. Cedax CAPS 3. Levaquin TABS 4. Sulfa  Drugs 5. Uribel CAPS  Family History Problems  1. Paternal history of  Cancer 2. Paternal history of  Death In The Family Father 63yrs 3. Maternal history of  Family Health Status - Mother's Age 60yrs 4. Family history of  Family Health Status Number Of Children 1 son and 2 daughters  Social History Problems  1. Marital History - Currently Married 2. Never A Smoker 3. Occupation: unemployed Denied  4. History of  Caffeine Use  Review of Systems Constitutional, skin, eye and otolaryngeal system(s) were reviewed and pertinent findings if present are noted.  Genitourinary: dysuria, nocturia, incontinence and pelvic pain, but no urinary frequency, no urinary urgency, no urinary hesitancy, urinary stream does not start and stop, no incomplete emptying of bladder, no hematuria and initiating urination does not require straining.  Gastrointestinal: heartburn, but no nausea, no vomiting, no flank pain, no abdominal pain, no diarrhea, no constipation and no melena.    Vitals Vital Signs [Data Includes: Last 1 Day]  12Feb2014 02:51PM  Blood Pressure: 150 / 87 Temperature: 97.6 F Heart Rate: 105  Physical Exam Constitutional: Well nourished and well developed . No acute distress. The patient appears well hydrated. Thin anxious female in NAD.  ENT:. The ears and nose are normal in appearance.  Neck: The appearance of the neck is normal.  Pulmonary: No respiratory distress.  Cardiovascular:. No peripheral edema.  Abdomen: The abdomen is soft and nontender. No CVA tenderness.  Lymphatics: The femoral and inguinal nodes are not enlarged or tender.  Skin: Normal skin turgor, no visible rash and no visible skin lesions.  Neuro/Psych:. Mood and affect are appropriate.    Results/Data Urine [Data Includes: Last  1 Day]   12Feb2014  COLOR YELLOW   APPEARANCE CLEAR   SPECIFIC GRAVITY <1.005   pH 6.0   GLUCOSE NEG mg/dL  BILIRUBIN NEG   KETONE NEG mg/dL  BLOOD NEG   PROTEIN NEG mg/dL   UROBILINOGEN 0.2 mg/dL  NITRITE NEG   LEUKOCYTE ESTERASE NEG    Assessment Assessed  1. Acute Cystitis 595.0 2. 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 Dysuria 788.1            40 minute consultation with this 62 yo female with chronic pelvic pain, presenting with history or recurrent UTI. She is cultured today, and covered with Rocephin, because of her current prednisone use, and the current  severe weather ( causing closure of the office),  and necessity to culture her urine to see if she is is actually infected. I suspect, however,  that she  has IC, because of her chronic pelvic and suprapubic pain, her negative urines in the past, and her multiple symptoms which are associated with IC ( asthma, allergies, chronic constipation). She has a significant PUFF score this AM also.  We have discussed combination pelvic exam and cysto/HOD, and she is in agreement with her evaluation . Pt has had Rocephin to cover her, and will finish her prednisone. She will begin amitryptylene 25mg  hs and schedule her cysto, HOD.   Plan Acute Cystitis (595.0)  1. URINE CULTURE  Done: 12Feb2014 Chronic Interstitial Cystitis (595.1)  2. Amitriptyline HCl 25 MG Oral Tablet; TAKE 1 TABLET BY MOUTH EVERY NIGHT AT BEDTIME;  Therapy: 12Feb2014 to (Evaluate:13Apr2014)  Requested for: 12Feb2014; Last Rx:12Feb2014;  Edited Health Maintenance (V70.0)  3. UA With REFLEX  Done: 12Feb2014 02:44PM   Amitriptylene 25 mg hs schedule cysto, HOD   Signatures Electronically signed by : Jethro Bolus, M.D.; May 10 2012  9:34AM

## 2012-06-11 NOTE — Anesthesia Preprocedure Evaluation (Addendum)
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; hoarsness breath sounds clear to auscultation  Pulmonary exam normal       Cardiovascular negative cardio ROS  Rate:Normal     Neuro/Psych negative neurological ROS  negative psych ROS   GI/Hepatic Neg liver ROS, GERD-  Medicated,  Endo/Other  negative endocrine ROS  Renal/GU negative Renal ROS  negative genitourinary   Musculoskeletal negative musculoskeletal ROS (+)   Abdominal   Peds  Hematology negative hematology ROS (+)   Anesthesia Other Findings   Reproductive/Obstetrics negative OB ROS                          Anesthesia Physical Anesthesia Plan  ASA: II  Anesthesia Plan: General   Post-op Pain Management:    Induction: Intravenous  Airway Management Planned: LMA  Additional Equipment:   Intra-op Plan:   Post-operative Plan: Extubation in OR  Informed Consent: I have reviewed the patients History and Physical, chart, labs and discussed the procedure including the risks, benefits and alternatives for the proposed anesthesia with the patient or authorized representative who has indicated his/her understanding and acceptance.   Dental advisory given  Plan Discussed with: CRNA  Anesthesia Plan Comments:         Anesthesia Quick Evaluation

## 2012-06-11 NOTE — Anesthesia Procedure Notes (Signed)
Procedure Name: LMA Insertion Date/Time: 06/11/2012 10:31 AM Performed by: Renella Cunas D Pre-anesthesia Checklist: Patient identified, Emergency Drugs available, Suction available and Patient being monitored Patient Re-evaluated:Patient Re-evaluated prior to inductionOxygen Delivery Method: Circle System Utilized Preoxygenation: Pre-oxygenation with 100% oxygen Intubation Type: IV induction Ventilation: Mask ventilation without difficulty LMA: LMA inserted LMA Size: 4.0 Number of attempts: 1 Airway Equipment and Method: bite block Placement Confirmation: positive ETCO2 Tube secured with: Tape Dental Injury: Teeth and Oropharynx as per pre-operative assessment

## 2012-06-11 NOTE — Op Note (Signed)
Pre-operative diagnosis :   Interstitial cystitis  Postoperative diagnosis:   Same  Operation:   Cystourethroscopy, hydrodistention the bladder (800 cc), cold cup bladder biopsy, cauterization of biopsy sites, instillation of Pyridium Marcaine solution, injection of Marcaine Kenalog solution in the subtrigonal space.  Surgeon:  Kathie Rhodes. Patsi Sears, MD  First assistant:   None  Anesthesia:  general  Preparation: After appropriate preanesthesia, the patient was brought to the operating room, placed on the operating table in the dorsal supine position where general LMA anesthesia was introduced. She was then replaced in the dorsal lithotomy position where the pubis was prepped with Betadine solution, and draped in usual fashion. Armband was double checked.    Review history:   Acute Cystitis 595.0  2. Dysuria 788.1  3. Gross Hematuria 599.71  History of Present Illness  62 yo female pt of Dr. Isabel Caprice, with hx of UTI, began to have burning in December, and has been treated with multiple antibiotics. She has seen Dr. Isabel Caprice in the past with dysuria, and poor flow, and poor emptying. She has been on a short course of prednisone this week. No dyspaurnea.  She has asthma, and allergies, GERD, and tinnitis, osteoporosis, and tingling in her feet. Poor sleep at night, thought 2ndary to cough. Miralax for chronic constipation.      Statement of  Likelihood of Success: Excellent. TIME-OUT observed.:  Procedure:   Cystourethroscopy was accomplished. The urethra appeared normal, without stenosis. There was no evidence of urethral mucosal abnormality, such as diverticular formation. In addition, the bladder base was normal. The trigone was normal. Clear reflux was seen from both cortices. The bladder held 800 cc. There were no ulcerations identified. However, after hydrodistention, multiple areas of microhematuria identified. Biopsies were obtained, and cauterization of biopsy sites was accomplished using the  Bugbee electrode.  Following this, Marcaine pretty of solution was inserted in the bladder, and Marcaine Kenalog solution was injected in the subtrigonal space.  The patient received 2 g of IV Ancef at the beginning of the procedure, as well as 1 g of IV Tylenol. In addition, at the end of procedure, she received IV Toradol. She was awakened, and taken to recovery room in good condition.

## 2012-06-11 NOTE — Interval H&P Note (Signed)
History and Physical Interval Note:  06/11/2012 10:27 AM  Kristy Garza  has presented today for surgery, with the diagnosis of Interstitial Cystitis  The various methods of treatment have been discussed with the patient and family. After consideration of risks, benefits and other options for treatment, the patient has consented to  Procedure(s) with comments: CYSTOSCOPY/BIOPSY/HYDRODISTENSION (N/A) - 1 hour requested for this case  BLADDER BIOPSY as a surgical intervention .  The patient's history has been reviewed, patient examined, no change in status, stable for surgery.  I have reviewed the patient's chart and labs.  Questions were answered to the patient's satisfaction.     [name]

## 2012-06-12 ENCOUNTER — Encounter (HOSPITAL_BASED_OUTPATIENT_CLINIC_OR_DEPARTMENT_OTHER): Payer: Self-pay | Admitting: Urology

## 2012-06-20 ENCOUNTER — Institutional Professional Consult (permissible substitution): Payer: Medicare Other | Admitting: Pulmonary Disease

## 2012-07-07 ENCOUNTER — Encounter: Payer: Self-pay | Admitting: Family Medicine

## 2012-07-07 ENCOUNTER — Ambulatory Visit (INDEPENDENT_AMBULATORY_CARE_PROVIDER_SITE_OTHER): Payer: No Typology Code available for payment source | Admitting: Family Medicine

## 2012-07-07 VITALS — BP 108/68 | HR 92 | Temp 97.1°F | Ht 69.0 in | Wt 116.8 lb

## 2012-07-07 DIAGNOSIS — J329 Chronic sinusitis, unspecified: Secondary | ICD-10-CM | POA: Insufficient documentation

## 2012-07-07 DIAGNOSIS — J309 Allergic rhinitis, unspecified: Secondary | ICD-10-CM

## 2012-07-07 DIAGNOSIS — J302 Other seasonal allergic rhinitis: Secondary | ICD-10-CM | POA: Insufficient documentation

## 2012-07-07 MED ORDER — AZITHROMYCIN 250 MG PO TABS: ORAL_TABLET | ORAL | Status: DC

## 2012-07-07 MED ORDER — MONTELUKAST SODIUM 10 MG PO TABS: 10.0000 mg | ORAL_TABLET | Freq: Every day | ORAL | Status: DC

## 2012-07-07 NOTE — Progress Notes (Signed)
Patient ID: Kristy Garza, female   DOB: Jul 31, 1950, 62 y.o.   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 Last week had a sore throat. Now has pressure in cheeks and gums, and sinus pressure. Blowing green out of the nose.Considering acupuncture.Wonders if she shouldn't be on Singulair for allergies, and cough. Her allergies are severe today.   PMH/PSH: reviewed/updated in Epic  SH/FH: reviewed/updated in Epic  Allergies: reviewed/updated in Epic  Medications: reviewed/updated in Epic  Immunizations: reviewed/updated in Epic    ROS: As above in the HPI. All other systems are stable or negative.  OBJECTIVE:    Congested On examination she appeared in good health and spirits. Vital signs as documented. BP 108/68  Pulse 92  Temp(Src) 97.1 F (36.2 C) (Oral)  Ht 5\' 9"  (1.753 m)  Wt 116 lb 12.8 oz (52.98 kg)  BMI 17.24 kg/m2  Skin warm and dry and without overt rashes.  Head, Eyes, Ears, throat: Black, remain thin rhinorrhea, postnasal drip. Maxillary sinuses tender, coarse voice Neck without JVD.  Lungs clear.  Heart exam notable for regular rhythm, normal sounds and absence of murmurs, rubs or gallops. Abdomen unremarkable and without evidence of organomegaly, masses, or abdominal aortic enlargement.  GYN Exam: External Genitalia: Vagina: Cervix: Uterus: Adnexa: R/V: Extremities nonedematous. Neurologic: nonfocal  ASSESSMENT:    ICD-9-CM   1. Seasonal allergic rhinitis 477.9 montelukast (SINGULAIR) 10 MG tablet  2. 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  PLAN: No orders of the defined types were placed in this encounter.   No results found for this or any previous visit (from the past 24 hour(s)). Meds ordered this encounter  Medications  . IRON, FERROUS GLUCONATE, PO    Sig: Take 1 tablet by mouth daily.  [name]  azithromycin (ZITHROMAX Z-PAK) 250 MG tablet    Sig: 6days dose pack. 2 tabs day1, and 1 tab day 2 to 5.    Dispense:  6 each    Refill:  0  . montelukast (SINGULAIR) 10 MG tablet    Sig: Take 1 tablet (10 mg total) by mouth at bedtime.    Dispense:  30 tablet    Refill:  3   Darsh Vandevoort P. [place] Charon, M.D.

## 2012-07-09 ENCOUNTER — Telehealth: Payer: Self-pay | Admitting: Family Medicine

## 2012-07-09 NOTE — Telephone Encounter (Signed)
Spoke with pt using the netti pot and has 2 days of zpack . Wants to know if should take  mucinex or something like that also wants to know if you know of an accupunctunist.

## 2012-07-10 NOTE — Telephone Encounter (Signed)
Pt notifed accupuntrist name provided Buck Mam [phone]

## 2012-08-08 ENCOUNTER — Ambulatory Visit (INDEPENDENT_AMBULATORY_CARE_PROVIDER_SITE_OTHER): Payer: No Typology Code available for payment source | Admitting: General Practice

## 2012-08-08 ENCOUNTER — Telehealth: Payer: Self-pay | Admitting: Pharmacist

## 2012-08-08 ENCOUNTER — Encounter: Payer: Self-pay | Admitting: General Practice

## 2012-08-08 ENCOUNTER — Telehealth: Payer: Self-pay | Admitting: Family Medicine

## 2012-08-08 VITALS — BP 101/71 | HR 83 | Temp 96.6°F | Ht 69.0 in | Wt 118.0 lb

## 2012-08-08 DIAGNOSIS — J309 Allergic rhinitis, unspecified: Secondary | ICD-10-CM

## 2012-08-08 DIAGNOSIS — J322 Chronic ethmoidal sinusitis: Secondary | ICD-10-CM

## 2012-08-08 MED ORDER — AZELASTINE HCL 0.1 % NA SOLN: 1.0000 | Freq: Two times a day (BID) | NASAL | Status: DC

## 2012-08-08 MED ORDER — AZITHROMYCIN 250 MG PO TABS: ORAL_TABLET | ORAL | Status: DC

## 2012-08-08 MED ORDER — FLUTICASONE PROPIONATE 50 MCG/ACT NA SUSP: 2.0000 | Freq: Every day | NASAL | Status: DC

## 2012-08-08 NOTE — Patient Instructions (Signed)

## 2012-08-08 NOTE — Telephone Encounter (Signed)
appt given for today with mae

## 2012-08-08 NOTE — Progress Notes (Signed)
  Subjective:    Patient ID: Kristy Garza, female    DOB: 06/23/1950, 62 y.o.   MRN: [id number]  HPI Presents today with nasal congestion, sinus pressure, cough. Onset 1 week and gradually worsened. Report seeing an acupuncturist. Reports taking herbs for allergies and lungs. Reports taking medications as directed, but forgot to take singular last night. Reports being here in march and received depomedrol and prednisone dose pack. Reports zyrtec makes her too drowsy. Denies colored drainage.    Review of Systems  Constitutional: Negative for chills and unexpected weight change.  HENT: Positive for congestion, rhinorrhea and sinus pressure. Negative for neck pain.   Respiratory: Positive for cough. Negative for chest tightness and shortness of breath.        Post nasal drip  Cardiovascular: Negative for chest pain.  Skin: Negative.   Neurological: Negative for dizziness.  Psychiatric/Behavioral: Negative.        Objective:   Physical Exam  Constitutional: She is oriented to person, place, and time. She appears well-developed and well-nourished.  HENT:  Nose: Rhinorrhea present. Right sinus exhibits maxillary sinus tenderness. Left sinus exhibits maxillary sinus tenderness.  Cardiovascular: Normal rate, regular rhythm and normal heart sounds.   Pulmonary/Chest: Effort normal and breath sounds normal.  Neurological: She is alert and oriented to person, place, and time.  Skin: Skin is warm and dry.          Assessment & Plan:  Sinusitis  Continue antibiotics even if feeling better Increase fluid intake Motrin or tylenol OTC OTC decongestant Throat lozenges if help Proper handwashing RTO if symptoms worsen Patient verbalized understanding Coralie Keens, FNP-C

## 2012-08-13 NOTE — Telephone Encounter (Signed)
Patient would like to get Prolia injection - would like to get before the end of May. Will double check with patient's insurance about coverage and then order medication.

## 2012-08-14 ENCOUNTER — Ambulatory Visit (INDEPENDENT_AMBULATORY_CARE_PROVIDER_SITE_OTHER): Payer: No Typology Code available for payment source | Admitting: Family Medicine

## 2012-08-14 ENCOUNTER — Encounter: Payer: Self-pay | Admitting: Family Medicine

## 2012-08-14 ENCOUNTER — Ambulatory Visit (INDEPENDENT_AMBULATORY_CARE_PROVIDER_SITE_OTHER): Payer: No Typology Code available for payment source

## 2012-08-14 ENCOUNTER — Telehealth: Payer: Self-pay | Admitting: Family Medicine

## 2012-08-14 VITALS — BP 115/72 | HR 114 | Temp 96.6°F | Ht 69.0 in | Wt 117.0 lb

## 2012-08-14 DIAGNOSIS — K219 Gastro-esophageal reflux disease without esophagitis: Secondary | ICD-10-CM

## 2012-08-14 DIAGNOSIS — M25579 Pain in unspecified ankle and joints of unspecified foot: Secondary | ICD-10-CM | POA: Insufficient documentation

## 2012-08-14 DIAGNOSIS — M25572 Pain in left ankle and joints of left foot: Secondary | ICD-10-CM

## 2012-08-14 DIAGNOSIS — J309 Allergic rhinitis, unspecified: Secondary | ICD-10-CM

## 2012-08-14 DIAGNOSIS — R059 Cough, unspecified: Secondary | ICD-10-CM

## 2012-08-14 DIAGNOSIS — R05 Cough: Secondary | ICD-10-CM

## 2012-08-14 DIAGNOSIS — S90122A Contusion of left lesser toe(s) without damage to nail, initial encounter: Secondary | ICD-10-CM

## 2012-08-14 DIAGNOSIS — S90129A Contusion of unspecified lesser toe(s) without damage to nail, initial encounter: Secondary | ICD-10-CM | POA: Insufficient documentation

## 2012-08-14 NOTE — Telephone Encounter (Signed)
Patient given appt for today.

## 2012-08-14 NOTE — Progress Notes (Signed)
Patient ID: Kristy Garza, female   DOB: 02-Feb-1951, 62 y.o.   MRN: [id number] SUBJECTIVE: HPI:   PMH/PSH: reviewed/updated in Epic  SH/FH: reviewed/updated in Epic  Allergies: reviewed/updated in Epic  Medications: reviewed/updated in Epic  Immunizations: reviewed/updated in Epic  ROS: As above in the HPI. All other systems are stable or negative.  OBJECTIVE: APPEARANCE:  Patient in no acute distress.The patient appeared well nourished and normally developed. Acyanotic. Waist: VITAL SIGNS:BP 115/72  Pulse 114  Temp(Src) 96.6 F (35.9 C) (Oral)  Ht 5\' 9"  (1.753 m)  Wt 117 lb (53.071 kg)  BMI 17.27 kg/m2 Slim W F   SKIN: warm and  Dry without overt rashes, tattoos and scars  HEAD and Neck: without JVD, Head and scalp: normal Eyes:No scleral icterus. Fundi normal, eye movements normal. Ears: Auricle normal, canal normal, Tympanic membranes normal, insufflation normal. Nose: nasal cogestionThroat: normal Neck & thyroid: normal  CHEST & LUNGS: Chest wall: normal Lungs: Clear  CVS: Reveals the PMI to be normally located. Regular rhythm, First and Second Heart sounds are normal,  absence of murmurs, rubs or gallops. Peripheral vasculature: Radial pulses: normal Dorsal pedis pulses: normal Posterior pulses: normal  ABDOMEN:  Appearance: normal Benign,, no organomegaly, no masses, no Abdominal Aortic enlargement. No Guarding , no rebound. No Bruits. Bowel sounds: normal  RECTAL: N/A GU: N/A  EXTREMETIES: nonedematous. Both Femoral and Pedal pulses are normal.  MUSCULOSKELETAL:  Spine: normal Joints: intact 2nd and 3 rd toes were tender swollen and ecchymotic.   NEUROLOGIC: oriented to time,place and person; nonfocal. Strength is normal Sensory is normal Reflexes are normal Cranial Nerves are normal.  ASSESSMENT: Pain in joint, ankle and foot, left - Plan: DG Foot Complete Left  G E R D  Cough  ALLERGIC RHINITIS  Toe contusion, left,  initial encounter  Allergic rhinitis   PLAN:  [facility] reading (PRIMARY) by  Dr.Cayla Wiegand: Fracture of 2 nd and 3 rd toes.  distal middle phalanx                 Orders Placed This Encounter  Procedures  . DG Foot Complete Left    Standing Status: Future     Number of Occurrences: 1     Standing Expiration Date: 10/14/2013    Order Specific Question:  Reason for Exam (SYMPTOM  OR DIAGNOSIS REQUIRED)    Answer:  toe pain and swelling    Order Specific Question:  Preferred imaging location?    Answer:  Internal   Results for orders placed during the hospital encounter of 06/11/12  POCT HEMOGLOBIN-HEMACUE      Result Value Range   Hemoglobin 12.0  12.0 - 15.0 g/dL   No orders of the defined types were placed in this encounter.    Buddy tape the fractured toes.  Recommend alternative ENT to evaluate the recurrent nature of her allergies and "sinusitis" Ice  Elevate the foot when sitting RTc in 3 months.  Jaysten Essner P. [place] Charon, M.D.

## 2012-08-14 NOTE — Telephone Encounter (Signed)
Called patient's insurance - Prolia needs prior authorization. Paperwork completed for prior auth and faxed to [employer] at [phone]. Should have decision in 72 hours.

## 2012-08-21 NOTE — Telephone Encounter (Signed)
Prior authorization for Prolia obtained.   Patient has met her ins deductible and copay should be zero. I will have Ardine Eng, RN order medication and call patient to set up appt for administration once medication available.

## 2012-08-22 NOTE — Telephone Encounter (Signed)
Prolia ordered. Will let Tammy know when it arrives

## 2012-08-27 NOTE — Telephone Encounter (Signed)
Prolia received and in the lab refrigerator.

## 2012-09-05 ENCOUNTER — Ambulatory Visit (INDEPENDENT_AMBULATORY_CARE_PROVIDER_SITE_OTHER): Payer: No Typology Code available for payment source | Admitting: Pharmacist

## 2012-09-05 ENCOUNTER — Encounter: Payer: Self-pay | Admitting: Pharmacist

## 2012-09-05 DIAGNOSIS — M810 Age-related osteoporosis without current pathological fracture: Secondary | ICD-10-CM

## 2012-09-05 MED ORDER — DENOSUMAB 60 MG/ML ~~LOC~~ SOLN
60.0000 mg | Freq: Once | SUBCUTANEOUS | Status: AC
  Administered 2012-09-05: 60 mg via SUBCUTANEOUS

## 2012-09-05 NOTE — Progress Notes (Signed)
Dx: Ostorosis  Patient here today for first Prolia injection  Last DEXA 06/02/2010 t-score for L spine was -3.7  She is currently supplementing with calcium with calcium citrate 1 tablet daily and also using Boost and calcium rich food.   Last vitamin d was 49 (2012).  A:  Osteoporosis  P: 1.  Prolia 60mg /mL 1 mL SQ given today - next December 2014. 2.  Continue calcium supplementaion 3.  Weight exercise/ walking daily  4. Recheck Dexa in November 2014.   Henrene Pastor, PharmD

## 2012-09-06 ENCOUNTER — Other Ambulatory Visit: Payer: Self-pay | Admitting: Family Medicine

## 2012-09-06 DIAGNOSIS — Z780 Asymptomatic menopausal state: Secondary | ICD-10-CM

## 2012-10-05 ENCOUNTER — Ambulatory Visit (INDEPENDENT_AMBULATORY_CARE_PROVIDER_SITE_OTHER): Payer: Medicare Other | Admitting: Family Medicine

## 2012-10-05 ENCOUNTER — Telehealth: Payer: Self-pay | Admitting: Family Medicine

## 2012-10-05 ENCOUNTER — Encounter: Payer: Self-pay | Admitting: Family Medicine

## 2012-10-05 VITALS — BP 109/65 | HR 64 | Temp 97.6°F | Wt 114.4 lb

## 2012-10-05 DIAGNOSIS — J309 Allergic rhinitis, unspecified: Secondary | ICD-10-CM

## 2012-10-05 DIAGNOSIS — D649 Anemia, unspecified: Secondary | ICD-10-CM

## 2012-10-05 DIAGNOSIS — J302 Other seasonal allergic rhinitis: Secondary | ICD-10-CM

## 2012-10-05 DIAGNOSIS — K219 Gastro-esophageal reflux disease without esophagitis: Secondary | ICD-10-CM

## 2012-10-05 DIAGNOSIS — R059 Cough, unspecified: Secondary | ICD-10-CM

## 2012-10-05 DIAGNOSIS — R05 Cough: Secondary | ICD-10-CM

## 2012-10-05 LAB — POCT CBC
Granulocyte percent: 75.4 %G (ref 37–80)
HCT, POC: 36.6 % — AB (ref 37.7–47.9)
Hemoglobin: 12.6 g/dL (ref 12.2–16.2)
Lymph, poc: 1.1 (ref 0.6–3.4)
MCH, POC: 31.1 pg (ref 27–31.2)
MCHC: 34.5 g/dL (ref 31.8–35.4)
MCV: 89.9 fL (ref 80–97)
MPV: 8.2 fL (ref 0–99.8)
POC Granulocyte: 4.7 (ref 2–6.9)
POC LYMPH PERCENT: 17.3 %L (ref 10–50)
Platelet Count, POC: 241 10*3/uL (ref 142–424)
RBC: 4.1 M/uL (ref 4.04–5.48)
RDW, POC: 13.4 %
WBC: 6.2 10*3/uL (ref 4.6–10.2)

## 2012-10-05 LAB — ANEMIA PANEL 7
%SAT: 28 % (ref 20–55)
ABS Retic: 40.7 10*3/uL (ref 19.0–186.0)
Ferritin: 44 ng/mL (ref 10–291)
Folate: >20 ng/mL
HCT: 35.8 % — ABNORMAL LOW (ref 36.0–46.0)
Hemoglobin: 12.1 g/dL (ref 12.0–15.0)
Iron: 92 ug/dL (ref 42–145)
MCH: 29.7 pg (ref 26.0–34.0)
MCHC: 33.8 g/dL (ref 30.0–36.0)
MCV: 88 fL (ref 78.0–100.0)
Platelets: 279 10*3/uL (ref 150–400)
RBC.: 4.07 MIL/uL (ref 3.87–5.11)
RBC: 4.07 MIL/uL (ref 3.87–5.11)
RDW: 13.6 % (ref 11.5–15.5)
Retic Ct Pct: 1 % (ref 0.4–2.3)
TIBC: 327 ug/dL (ref 250–470)
UIBC: 235 ug/dL (ref 125–400)
Vitamin B-12: 481 pg/mL (ref 211–911)
WBC: 5.9 10*3/uL (ref 4.0–10.5)

## 2012-10-05 MED ORDER — DEXLANSOPRAZOLE 30 MG PO CPDR: 30.0000 mg | DELAYED_RELEASE_CAPSULE | Freq: Every day | ORAL | Status: DC

## 2012-10-05 NOTE — Progress Notes (Signed)
Patient ID: EDIE VALLANDINGHAM, female   DOB: Sep 19, 1950, 62 y.o.   MRN: [id number] SUBJECTIVE: CC: Chief Complaint  Patient presents with  . Cough    multiple complaints dry cough headache fatigue  wants rx for prendnisone ,dexilant,tussionex.     HPI:  Cough flare. Using goody powders, tylenol doesn't help.Called Dr Lucie Leather today but he was not in. ENT: has not been there recently.  Has  Stopped the singulair. Has hycodan at home but wants tussionex. Nasal congestion and coughing bouts.post nasal drip. Coughing bouts.    Past Medical History  Diagnosis Date  . Asthma   . Osteoporosis   . Acid reflux   . PONV (postoperative nausea and vomiting)   . Tinnitus    Past Surgical History  Procedure Laterality Date  . Abdominal hysterectomy  2000  . 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 Ethmoidectomy  2012  . Septoplasty  1980's  . Cystoscopy with hydrodistension and biopsy N/A 06/11/2012    Procedure: CYSTOSCOPY/BIOPSY/HYDRODISTENSION with Instillation of Pyridium and Marcaine and Kenalog;  Surgeon: Kathi Ludwig, MD;  Location: [facility];  Service: Urology;  Laterality: N/A;  1 hour requested for this case  BLADDER BIOPSY   History   Social History  . Marital Status: Married    Spouse Name: N/A    Number of Children: N/A  . Years of Education: N/A   Occupational History  . Not on file.   Social History Main Topics  . Smoking status: Never Smoker   . Smokeless tobacco: Not on file  . Alcohol Use: No  . Drug Use: No  . Sexually Active: Not on file   Other Topics Concern  . Not on file   Social History Narrative  . No narrative on file   Family History  Problem Relation Age of Onset  . Hyperlipidemia Mother   . Cancer Father    Current Outpatient Prescriptions on File Prior to Visit  Medication Sig Dispense Refill  . acetaminophen (TYLENOL) 500 MG tablet Take 500 mg by mouth every 6 (six) hours as needed for pain.      [name]  azelastine (ASTELIN) 137 MCG/SPRAY nasal spray Place 1 spray into the nose 2 (two) times daily. Use in each nostril as directed  30 mL  6  . Calcium Glycerophosphate (PRELIEF) 340 (65-50) MG (CA-P) TABS Take 1 capsule by mouth 3 (three) times daily before meals.  90 each  11  . Calcium-Magnesium-Vitamin D (CITRACAL CALCIUM+D PO) Take by mouth.      . dextromethorphan-guaiFENesin (MUCINEX DM) 30-600 MG per 12 hr tablet Take 1 tablet by mouth every 12 (twelve) hours.      . Dextromethorphan-Guaifenesin (TUSSIN DM PO) Take by mouth as needed.      . fluticasone (FLONASE) 50 MCG/ACT nasal spray Place 2 sprays into the nose daily.  16 g  6  . pantoprazole (PROTONIX) 40 MG tablet Take 40 mg by mouth daily.      . pentosan polysulfate (ELMIRON) 100 MG capsule Take 1 capsule (100 mg total) by mouth 3 (three) times daily before meals.  90 capsule  6  . polyethylene glycol (MIRALAX / GLYCOLAX) packet May use 2 tablespoons in juice or water daily; or 1 packet.  14 each  0  . azithromycin (ZITHROMAX) 250 MG tablet Take as directed  6 tablet  0  . hydrocodone-ibuprofen (VICOPROFEN) 5-200 MG per tablet Take 1 tablet by mouth every 6 (six)  hours as needed for pain. May increase to 2 tabs q 6 h.  30 tablet  0  . hydrOXYzine (ATARAX/VISTARIL) 10 MG tablet Take 1 tablet (10 mg total) by mouth 3 (three) times daily as needed for itching or anxiety (if medication causes sleepiness, may take 30mg  hs.).  90 tablet  6  . ibuprofen (ADVIL,MOTRIN) 200 MG tablet Take 400 mg by mouth every 6 (six) hours as needed.      . IRON, FERROUS GLUCONATE, PO Take 1 tablet by mouth daily.      . montelukast (SINGULAIR) 10 MG tablet Take 1 tablet (10 mg total) by mouth at bedtime.  30 tablet  3  . phenazopyridine (PYRIDIUM) 95 MG tablet Take 95 mg by mouth 3 (three) times daily as needed for pain.      . predniSONE (DELTASONE) 10 MG tablet Take 10 mg by mouth taper from 4 doses each day to 1 dose and stop.      [name] trimethoprim (TRIMPEX)  100 MG tablet Take 1 tablet (100 mg total) by mouth 1 day or 1 dose.  30 tablet  1  . URELLE (URELLE/URISED) 81 MG TABS Take 1 tablet (81 mg total) by mouth 3 (three) times daily.  30 each  2  . Vitamins-Lipotropics (LIPOFLAVONOID PO) Take by mouth.       Current Facility-Administered Medications on File Prior to Visit  Medication Dose Route Frequency Provider Last Rate Last Dose  . bupivacaine (MARCAINE) 0.5 % 10 mL, triamcinolone acetonide (KENALOG-40) 40 mg injection   Subcutaneous Once Kathi Ludwig, MD      . bupivacaine (MARCAINE) 0.5 % 15 mL, phenazopyridine (PYRIDIUM) 400 mg bladder mixture   Bladder Instillation Once Kathi Ludwig, MD       Allergies  Allergen Reactions  . Avelox (Moxifloxacin Hcl In Nacl) Other (See Comments)    Tremors   . Cedax (Ceftibuten) Other (See Comments)    tremors  . Clindamycin/Lincomycin     tachycardia  . Levofloxacin Other (See Comments)    Feels dehydrated  . Sulfonamide Derivatives Other (See Comments)    Gi upset    There is no immunization history on file for this patient. Prior to Admission medications   Medication Sig Start Date End Date Taking? Authorizing Provider  acetaminophen (TYLENOL) 500 MG tablet Take 500 mg by mouth every 6 (six) hours as needed for pain.   Yes Historical Provider, MD  azelastine (ASTELIN) 137 MCG/SPRAY nasal spray Place 1 spray into the nose 2 (two) times daily. Use in each nostril as directed 08/08/12  Yes Mae Shelda Altes, FNP  Calcium Glycerophosphate (PRELIEF) 340 (65-50) MG (CA-P) TABS Take 1 capsule by mouth 3 (three) times daily before meals. 06/11/12  Yes Kathi Ludwig, MD  Calcium-Magnesium-Vitamin D (CITRACAL CALCIUM+D PO) Take by mouth.   Yes Historical Provider, MD  dextromethorphan-guaiFENesin (MUCINEX DM) 30-600 MG per 12 hr tablet Take 1 tablet by mouth every 12 (twelve) hours.   Yes Historical Provider, MD  Dextromethorphan-Guaifenesin (TUSSIN DM PO) Take by mouth as needed.    Yes Historical Provider, MD  fluticasone (FLONASE) 50 MCG/ACT nasal spray Place 2 sprays into the nose daily. 08/08/12  Yes Mae Shelda Altes, FNP  pantoprazole (PROTONIX) 40 MG tablet Take 40 mg by mouth daily.   Yes Historical Provider, MD  pentosan polysulfate (ELMIRON) 100 MG capsule Take 1 capsule (100 mg total) by mouth 3 (three) times daily before meals. 06/11/12  Yes Kathi Ludwig, MD  polyethylene glycol (MIRALAX / GLYCOLAX) packet May use 2 tablespoons in juice or water daily; or 1 packet. 06/11/12  Yes Kathi Ludwig, MD  azithromycin [facility]) 250 MG tablet Take as directed 08/08/12   Coralie Keens, FNP  hydrocodone-ibuprofen (VICOPROFEN) 5-200 MG per tablet Take 1 tablet by mouth every 6 (six) hours as needed for pain. May increase to 2 tabs q 6 h. 06/11/12   Kathi Ludwig, MD  hydrOXYzine (ATARAX/VISTARIL) 10 MG tablet Take 1 tablet (10 mg total) by mouth 3 (three) times daily as needed for itching or anxiety (if medication causes sleepiness, may take 30mg  hs.). 06/11/12   Kathi Ludwig, MD  ibuprofen (ADVIL,MOTRIN) 200 MG tablet Take 400 mg by mouth every 6 (six) hours as needed.    Historical Provider, MD  IRON, FERROUS GLUCONATE, PO Take 1 tablet by mouth daily.    Historical Provider, MD  montelukast (SINGULAIR) 10 MG tablet Take 1 tablet (10 mg total) by mouth at bedtime. 07/07/12   Ileana Ladd, MD  phenazopyridine (PYRIDIUM) 95 MG tablet Take 95 mg by mouth 3 (three) times daily as needed for pain.    Historical Provider, MD  predniSONE (DELTASONE) 10 MG tablet Take 10 mg by mouth taper from 4 doses each day to 1 dose and stop.    Historical Provider, MD  trimethoprim (TRIMPEX) 100 MG tablet Take 1 tablet (100 mg total) by mouth 1 day or 1 dose. 06/11/12   Kathi Ludwig, MD  URELLE (URELLE/URISED) 81 MG TABS Take 1 tablet (81 mg total) by mouth 3 (three) times daily. 06/11/12   Kathi Ludwig, MD  Vitamins-Lipotropics (LIPOFLAVONOID PO) Take by  mouth.    Historical Provider, MD    ROS: As above in the HPI. All other systems are stable or negative.  OBJECTIVE: APPEARANCE:  Patient in no acute distress.The patient appeared well nourished and normally developed. Acyanotic. Waist: VITAL SIGNS:BP 109/65  Pulse 64  Temp(Src) 97.6 F (36.4 C) (Oral)  Wt 114 lb 6.4 oz (51.891 kg)  BMI 16.89 kg/m2 WF slim built  Dysphonia.hacky bouts of cough.  SKIN: warm and  Dry without overt rashes, tattoos and scars  HEAD and Neck: without JVD, Head and scalp: normal Eyes:No scleral icterus. Fundi normal, eye movements normal. Ears: Auricle normal, canal normal, Tympanic membranes normal, insufflation normal. Nose: congestion Throat: postnasal drip Neck & thyroid: normal  CHEST & LUNGS: Chest wall: normal Lungs: Clear  CVS: Reveals the PMI to be normally located. Regular rhythm, First and Second Heart sounds are normal,  absence of murmurs, rubs or gallops. Peripheral vasculature: Radial pulses: normal Dorsal pedis pulses: normal Posterior pulses: normal  ABDOMEN:  Appearance: normal Benign, no organomegaly, no masses, no Abdominal Aortic enlargement. No Guarding , no rebound. No Bruits. Bowel sounds: normal  RECTAL: N/A GU: N/A  EXTREMETIES: nonedematous. Both Femoral and Pedal pulses are normal.  MUSCULOSKELETAL:  Spine: normal Joints: intact  NEUROLOGIC: oriented to time,place and person; nonfocal. Strength is normal Sensory is normal Reflexes are normal Cranial Nerves are normal.  ASSESSMENT: Seasonal allergic rhinitis  Cough  G E R D - Plan: Dexlansoprazole 30 MG capsule  Anemia - Plan: POCT CBC, Anemia panel 7   PLAN: Use hycodan at home Hold the mucinex Continue with the astelin Nasal spray Continue with the mometasone  Nasal washes. Restart the the singulair See her ENT. See her allergist Orders Placed This Encounter  Procedures  . Anemia panel 7  . POCT CBC  Meds ordered this  encounter  Medications  . Dexlansoprazole 30 MG capsule    Sig: Take 1 capsule (30 mg total) by mouth daily.    Dispense:  30 capsule    Refill:  1   Suspect GERD aggravates her cough.  GERD precautions. Discussed risks of ongoing requests for  Steroids when it may not be appropriate.  Follow up with the speciallists.  Return if symptoms worsen or fail to improve.  Lanyia Jewel P. [place]esto Charon, M.D.

## 2012-10-05 NOTE — Telephone Encounter (Signed)
appt given for today 

## 2012-10-05 NOTE — Patient Instructions (Signed)
Diet for Gastroesophageal Reflux Disease, Adult Reflux (acid reflux) is when acid from your stomach flows up into the esophagus. When acid comes in contact with the esophagus, the acid causes irritation and soreness (inflammation) in the esophagus. When reflux happens often or so severely that it causes damage to the esophagus, it is called gastroesophageal reflux disease (GERD). Nutrition therapy can help ease the discomfort of GERD. FOODS OR DRINKS TO AVOID OR LIMIT  Smoking or chewing tobacco. Nicotine is one of the most potent stimulants to acid production in the gastrointestinal tract.  Caffeinated and decaffeinated coffee and black tea.  Regular or low-calorie carbonated beverages or energy drinks (caffeine-free carbonated beverages are allowed).   Strong spices, such as black pepper, white pepper, red pepper, cayenne, curry powder, and chili powder.  Peppermint or spearmint.  Chocolate.  High-fat foods, including meats and fried foods. Extra added fats including oils, butter, salad dressings, and nuts. Limit these to less than 8 tsp per day.  Fruits and vegetables if they are not tolerated, such as citrus fruits or tomatoes.  Alcohol.  Any food that seems to aggravate your condition. If you have questions regarding your diet, call your caregiver or a registered dietitian. OTHER THINGS THAT MAY HELP GERD INCLUDE:   Eating your meals slowly, in a relaxed setting.  Eating 5 to 6 small meals per day instead of 3 large meals.  Eliminating food for a period of time if it causes distress.  Not lying down until 3 hours after eating a meal.  Keeping the head of your bed raised 6 to 9 inches (15 to 23 cm) by using a foam wedge or blocks under the legs of the bed. Lying flat may make symptoms worse.  Being physically active. Weight loss may be helpful in reducing reflux in overweight or obese adults.  Wear loose fitting clothing EXAMPLE MEAL PLAN This meal plan is approximately  2,000 calories based on ChooseMy[employer].gov meal planning guidelines. Breakfast   cup cooked oatmeal.  1 cup strawberries.  1 cup low-fat milk.  1 oz almonds. Snack  1 cup cucumber slices.  6 oz yogurt (made from low-fat or fat-free milk). Lunch  2 slice whole-wheat bread.  2 oz sliced turkey.  2 tsp mayonnaise.  1 cup blueberries.  1 cup snap peas. Snack  6 whole-wheat crackers.  1 oz string cheese. Dinner   cup brown rice.  1 cup mixed veggies.  1 tsp olive oil.  3 oz grilled fish. Document Released: 03/14/2005 Document Revised: 06/06/2011 Document Reviewed: 01/28/2011 ExitCare Patient Information 2014 [employer]. Gastroesophageal Reflux Disease, Adult Gastroesophageal reflux disease (GERD) happens when acid from your stomach flows up into the esophagus. When acid comes in contact with the esophagus, the acid causes soreness (inflammation) in the esophagus. Over time, GERD may create small holes (ulcers) in the lining of the esophagus. CAUSES   Increased body weight. This puts pressure on the stomach, making acid rise from the stomach into the esophagus.  Smoking. This increases acid production in the stomach.  Drinking alcohol. This causes decreased pressure in the lower esophageal sphincter (valve or ring of muscle between the esophagus and stomach), allowing acid from the stomach into the esophagus.  Late evening meals and a full stomach. This increases pressure and acid production in the stomach.  A malformed lower esophageal sphincter. Sometimes, no cause is found. SYMPTOMS   Burning pain in the lower part of the mid-chest behind the breastbone and in the mid-stomach area. This may   occur twice a week or more often.  Trouble swallowing.  Sore throat.  Dry cough.  Asthma-like symptoms including chest tightness, shortness of breath, or wheezing. DIAGNOSIS  Your caregiver may be able to diagnose GERD based on your symptoms. In some cases,  X-rays and other tests may be done to check for complications or to check the condition of your stomach and esophagus. TREATMENT  Your caregiver may recommend over-the-counter or prescription medicines to help decrease acid production. Ask your caregiver before starting or adding any new medicines.  HOME CARE INSTRUCTIONS   Change the factors that you can control. Ask your caregiver for guidance concerning weight loss, quitting smoking, and alcohol consumption.  Avoid foods and drinks that make your symptoms worse, such as:  Caffeine or alcoholic drinks.  Chocolate.  Peppermint or mint flavorings.  Garlic and onions.  Spicy foods.  Citrus fruits, such as oranges, lemons, or limes.  Tomato-based foods such as sauce, chili, salsa, and pizza.  Fried and fatty foods.  Avoid lying down for the 3 hours prior to your bedtime or prior to taking a nap.  Eat small, frequent meals instead of large meals.  Wear loose-fitting clothing. Do not wear anything tight around your waist that causes pressure on your stomach.  Raise the head of your bed 6 to 8 inches with wood blocks to help you sleep. Extra pillows will not help.  Only take over-the-counter or prescription medicines for pain, discomfort, or fever as directed by your caregiver.  Do not take aspirin, ibuprofen, or other nonsteroidal anti-inflammatory drugs (NSAIDs). SEEK IMMEDIATE MEDICAL CARE IF:   You have pain in your arms, neck, jaw, teeth, or back.  Your pain increases or changes in intensity or duration.  You develop nausea, vomiting, or sweating (diaphoresis).  You develop shortness of breath, or you faint.  Your vomit is green, yellow, black, or looks like coffee grounds or blood.  Your stool is red, bloody, or black. These symptoms could be signs of other problems, such as heart disease, gastric bleeding, or esophageal bleeding. MAKE SURE YOU:   Understand these instructions.  Will watch your  condition.  Will get help right away if you are not doing well or get worse. Document Released: 12/22/2004 Document Revised: 06/06/2011 Document Reviewed: 10/01/2010 ExitCare Patient Information 2014 [employer].  

## 2012-10-07 NOTE — Progress Notes (Signed)
Quick Note:  Call patient. Labs normal. No change in plan. ______ 

## 2012-10-31 ENCOUNTER — Telehealth: Payer: Self-pay | Admitting: Family Medicine

## 2012-11-02 ENCOUNTER — Other Ambulatory Visit: Payer: Self-pay | Admitting: Family Medicine

## 2012-11-02 DIAGNOSIS — R059 Cough, unspecified: Secondary | ICD-10-CM

## 2012-11-02 DIAGNOSIS — R05 Cough: Secondary | ICD-10-CM

## 2012-11-02 NOTE — Telephone Encounter (Signed)
done

## 2012-11-02 NOTE — Telephone Encounter (Signed)
Spoke with pt and she stated she saw Dr Sharyn Lull PA and they put her on prednisone in which she completed on Sunday. Concerned about chronic cough and has seen Dr Maple Hudson and when she called their offfice she was told been 5 yrs since seen and would be new pt.  Would llike referral to Dr Melba Coon

## 2012-11-14 ENCOUNTER — Telehealth: Payer: Self-pay | Admitting: Family Medicine

## 2012-11-14 NOTE — Telephone Encounter (Signed)
PT CALLED 8/20 AND SAID SHE SPOKE TO HER ALLERGIST AND WILL BE SEEN AT THEIR OFFICE.  RS

## 2012-11-17 ENCOUNTER — Ambulatory Visit (INDEPENDENT_AMBULATORY_CARE_PROVIDER_SITE_OTHER): Payer: Medicare Other | Admitting: Family Medicine

## 2012-11-17 ENCOUNTER — Encounter: Payer: Self-pay | Admitting: Family Medicine

## 2012-11-17 VITALS — BP 110/74 | HR 89 | Temp 97.5°F | Ht 69.0 in | Wt 113.0 lb

## 2012-11-17 DIAGNOSIS — J329 Chronic sinusitis, unspecified: Secondary | ICD-10-CM

## 2012-11-17 MED ORDER — TRIAMCINOLONE ACETONIDE 40 MG/ML IJ SUSP
40.0000 mg | Freq: Once | INTRAMUSCULAR | Status: AC
  Administered 2012-11-17: 40 mg via INTRAMUSCULAR

## 2012-11-17 NOTE — Patient Instructions (Addendum)

## 2012-11-17 NOTE — Progress Notes (Signed)
  Subjective:    Patient ID: Kristy Garza, female    DOB: Jun 13, 1950, 62 y.o.   MRN: [id number]  HPI This 62 y.o. female presents for evaluation of URI sxs for over a week. She states she has hx of asthma.  She denies wheezing or SOB. She states she was rx'd a Zpak and she finished it.  She has another Zpak From home.   Review of Systems C/o URI sx's No chest pain, SOB, HA, dizziness, vision change, N/V, diarrhea, constipation, dysuria, urinary urgency or frequency, myalgias, arthralgias or rash.      Objective:   Physical Exam  Vital signs noted  Well developed well nourished female.  HEENT - Head atraumatic Normocephalic                Eyes - PERRLA, Conjuctiva - clear Sclera- Clear EOMI                Ears - EAC's Wnl TM's Wnl Gross Hearing WNL                Nose - Nares patent                 Throat - oropharanx wnl Respiratory - Lungs CTA bilateral Cardiac - RRR S1 and S2 without murmur GI - Abdomen soft Nontender and bowel sounds active x 4.      Assessment & Plan:  Sinusitis - Plan: triamcinolone acetonide (KENALOG-40) injection 40 mg Kenalog 60mg  IM

## 2012-11-20 ENCOUNTER — Telehealth: Payer: Self-pay | Admitting: Family Medicine

## 2012-11-20 NOTE — Telephone Encounter (Signed)
Could be and probably needs BMP and have potassium checked.

## 2012-11-24 ENCOUNTER — Other Ambulatory Visit: Payer: Self-pay | Admitting: Family Medicine

## 2012-11-24 DIAGNOSIS — R05 Cough: Secondary | ICD-10-CM

## 2012-11-24 DIAGNOSIS — R059 Cough, unspecified: Secondary | ICD-10-CM

## 2012-11-24 MED ORDER — BENZONATATE 200 MG PO CAPS: 200.0000 mg | ORAL_CAPSULE | Freq: Three times a day (TID) | ORAL | Status: DC | PRN

## 2012-11-24 NOTE — Progress Notes (Signed)
Patient called coighing and the hycodan is too strong. Patient is well know with allergies and GERd and a chronic cough. She has been referred to Pulmonary but the appointment is in October. Assess: chronic cough  Plan Tessalon 200 mg TID for 10 days ordered in EPIC.  Kristy Garza, M.D.

## 2012-11-27 NOTE — Telephone Encounter (Signed)
Patient aware wants something called in for yeast in mouth clotrimazole 10mg  tab that's what she has taken for it before

## 2012-11-28 ENCOUNTER — Encounter: Payer: Self-pay | Admitting: General Practice

## 2012-11-28 ENCOUNTER — Ambulatory Visit (INDEPENDENT_AMBULATORY_CARE_PROVIDER_SITE_OTHER): Payer: Medicare Other | Admitting: Family Medicine

## 2012-11-28 VITALS — BP 104/73 | HR 83 | Temp 97.0°F | Ht 69.0 in | Wt 109.0 lb

## 2012-11-28 DIAGNOSIS — R5381 Other malaise: Secondary | ICD-10-CM

## 2012-11-28 DIAGNOSIS — R634 Abnormal weight loss: Secondary | ICD-10-CM

## 2012-11-28 DIAGNOSIS — R5383 Other fatigue: Secondary | ICD-10-CM

## 2012-11-28 DIAGNOSIS — R531 Weakness: Secondary | ICD-10-CM

## 2012-11-28 DIAGNOSIS — J209 Acute bronchitis, unspecified: Secondary | ICD-10-CM

## 2012-11-28 LAB — POCT CBC
Granulocyte percent: 66.9 %G (ref 37–80)
HCT, POC: 39.1 % (ref 37.7–47.9)
Hemoglobin: 13.2 g/dL (ref 12.2–16.2)
Lymph, poc: 1.4 (ref 0.6–3.4)
MCH, POC: 30.6 pg (ref 27–31.2)
MCHC: 33.8 g/dL (ref 31.8–35.4)
MCV: 90.5 fL (ref 80–97)
MPV: 7.8 fL (ref 0–99.8)
POC Granulocyte: 3.9 (ref 2–6.9)
POC LYMPH PERCENT: 24.3 %L (ref 10–50)
Platelet Count, POC: 262 10*3/uL (ref 142–424)
RBC: 4.3 M/uL (ref 4.04–5.48)
RDW, POC: 12.8 %
WBC: 5.9 10*3/uL (ref 4.6–10.2)

## 2012-11-28 MED ORDER — AMOXICILLIN-POT CLAVULANATE 875-125 MG PO TABS: 1.0000 | ORAL_TABLET | Freq: Two times a day (BID) | ORAL | Status: DC

## 2012-11-28 MED ORDER — METHYLPREDNISOLONE (PAK) 4 MG PO TABS: ORAL_TABLET | ORAL | Status: DC

## 2012-11-28 MED ORDER — CLOTRIMAZOLE 1 % VA CREA: 1.0000 | TOPICAL_CREAM | Freq: Two times a day (BID) | VAGINAL | Status: DC

## 2012-11-28 NOTE — Progress Notes (Signed)
  Subjective:    Patient ID: Kristy Garza, female    DOB: 1950-06-26, 62 y.o.   MRN: [id number]  HPI This 62 y.o. female presents for evaluation of cough, congestion, URI sx's and feeling bad. She was seen a week ago and tx with zpak and tessalon perles and her cough is not any better. She is using a short acting bronchodilator and Qvar.  She has hx of asthma.  She .   Review of Systems No chest pain, SOB, HA, dizziness, vision change, N/V, diarrhea, constipation, dysuria, urinary urgency or frequency, myalgias, arthralgias or rash.     Objective:   Physical Exam Vital signs noted  Well developed well nourished female.  HEENT - Head atraumatic Normocephalic                Eyes - PERRLA, Conjuctiva - clear Sclera- Clear EOMI                Ears - EAC's Wnl TM's Wnl Gross Hearing WNL                Nose - Nares patent                 Throat - oropharanx wnl Respiratory - Lungs with scattered wheezes bilateral. Cardiac - RRR S1 and S2 without murmur GI - Abdomen soft Nontender and bowel sounds active x 4 Extremities - No edema. Neuro - Grossly intact.       Assessment & Plan:  Acute bronchitis - Plan: amoxicillin-clavulanate (AUGMENTIN) 875-125 MG per tablet, clotrimazole (GYNE-LOTRIMIN) 1 % vaginal cream, methylPREDNIsolone (MEDROL DOSPACK) 4 MG tablet She has 10 augmentin 875mg  and she is advised to take augmentin bid and then start on new rx and  Use the vaginal cream prn vaginal yeast infection.  Continue QVAR and short acting bronchodilator. She is advised to follow up with pulmonology in October.  She is advised to follow up prn.  Weakness - Plan: POCT CBC, CMP14+EGFR, TSH  Loss of weight - Plan: POCT CBC, CMP14+EGFR,   Follow up if not better.

## 2012-11-28 NOTE — Patient Instructions (Signed)

## 2012-11-29 LAB — CMP14+EGFR
ALT: 20 IU/L (ref 0–32)
AST: 21 IU/L (ref 0–40)
Albumin/Globulin Ratio: 2.4 (ref 1.1–2.5)
Albumin: 4.8 g/dL (ref 3.6–4.8)
Alkaline Phosphatase: 49 IU/L (ref 39–117)
BUN/Creatinine Ratio: 14 (ref 11–26)
BUN: 10 mg/dL (ref 8–27)
CO2: 25 mmol/L (ref 18–29)
Calcium: 9.9 mg/dL (ref 8.6–10.2)
Chloride: 100 mmol/L (ref 97–108)
Creatinine, Ser: 0.72 mg/dL (ref 0.57–1.00)
GFR calc Af Amer: 104 mL/min/{1.73_m2} (ref >59)
GFR calc non Af Amer: 90 mL/min/{1.73_m2} (ref >59)
Globulin, Total: 2 g/dL (ref 1.5–4.5)
Glucose: 89 mg/dL (ref 65–99)
Potassium: 4.9 mmol/L (ref 3.5–5.2)
Sodium: 139 mmol/L (ref 134–144)
Total Bilirubin: 0.4 mg/dL (ref 0.0–1.2)
Total Protein: 6.8 g/dL (ref 6.0–8.5)

## 2012-11-29 LAB — TSH: TSH: 0.419 u[IU]/mL — ABNORMAL LOW (ref 0.450–4.500)

## 2012-12-04 ENCOUNTER — Telehealth: Payer: Self-pay | Admitting: Family Medicine

## 2012-12-06 ENCOUNTER — Other Ambulatory Visit: Payer: Self-pay | Admitting: Family Medicine

## 2012-12-06 NOTE — Telephone Encounter (Signed)
She can take mucinex otc and follow up if she wants another abx.

## 2012-12-06 NOTE — Telephone Encounter (Signed)
Please advise 

## 2012-12-10 NOTE — Telephone Encounter (Signed)
Taking mucinex and has f/u with ENT tomorrow.

## 2012-12-13 ENCOUNTER — Other Ambulatory Visit: Payer: Self-pay | Admitting: Family Medicine

## 2012-12-17 ENCOUNTER — Other Ambulatory Visit: Payer: Self-pay | Admitting: Family Medicine

## 2012-12-19 ENCOUNTER — Telehealth: Payer: Self-pay | Admitting: Family Medicine

## 2012-12-19 ENCOUNTER — Other Ambulatory Visit (INDEPENDENT_AMBULATORY_CARE_PROVIDER_SITE_OTHER): Payer: Medicare Other

## 2012-12-19 DIAGNOSIS — R7989 Other specified abnormal findings of blood chemistry: Secondary | ICD-10-CM

## 2012-12-19 NOTE — Progress Notes (Signed)
Pt is here for labs only.

## 2012-12-20 LAB — THYROID PANEL WITH TSH
Free Thyroxine Index: 1.7 (ref 1.2–4.9)
T3 Uptake Ratio: 32 % (ref 24–39)
T4, Total: 5.4 ug/dL (ref 4.5–12.0)
TSH: 0.719 u[IU]/mL (ref 0.450–4.500)

## 2012-12-21 NOTE — Telephone Encounter (Signed)
Yes, I think it would be a good idea to be seen

## 2012-12-27 NOTE — Telephone Encounter (Signed)
Pt some better Has  appt with pulmonary tomorrow Will call back if needs to be seen

## 2012-12-28 ENCOUNTER — Other Ambulatory Visit: Payer: Medicare Other

## 2012-12-28 ENCOUNTER — Ambulatory Visit (INDEPENDENT_AMBULATORY_CARE_PROVIDER_SITE_OTHER): Payer: Medicare Other | Admitting: Internal Medicine

## 2012-12-28 ENCOUNTER — Encounter: Payer: Self-pay | Admitting: Internal Medicine

## 2012-12-28 VITALS — BP 110/72 | HR 80 | Ht 69.0 in | Wt 111.8 lb

## 2012-12-28 DIAGNOSIS — Z23 Encounter for immunization: Secondary | ICD-10-CM

## 2012-12-28 DIAGNOSIS — R059 Cough, unspecified: Secondary | ICD-10-CM

## 2012-12-28 DIAGNOSIS — J309 Allergic rhinitis, unspecified: Secondary | ICD-10-CM

## 2012-12-28 DIAGNOSIS — R05 Cough: Secondary | ICD-10-CM

## 2012-12-28 NOTE — Patient Instructions (Addendum)
Order- lab- Allergy profile   Dx allergic rhinitis, cough  Flu vax  Sample Anoro    1 puff, one time daily      You can still use your rescue inhaler if needed. Ok to stay off the Qvar for now, but you could start that back also if needed.

## 2012-12-28 NOTE — Progress Notes (Signed)
12/28/12- 62 yoF never smoker-Dr Wong/ [facility]-last seen by RB 2009; seen at [facility] chest 2002; chronic cough  Has seen Dr. Delton Coombes here and Dr Lucie Leather in the past. Complains of allergies, asthma, chronic cough for over 20 years. Help with inhalers and cough medicines. Using rescue inhaler once or twice a day. Not using Qvar now. Particularly bad in spring and fall. [place] smells musty when it rains. She thinks in retrospect that allergy shots in the past with more than she realized. She had one episode while on allergy shots, when she had throat tightness which scared her so she quit.. Occasional cough at night. Previous PFTs are filed, but as recently as 2009 showed reversible obstructive airways disease and small airways. Singulair blamed for paresthesias. Son smokes outside the home. History of sinus surgery, vocal cord polyp. Father had allergies and asthma  Prior to Admission medications   Medication Sig Start Date End Date Taking? Authorizing Provider  albuterol (PROVENTIL HFA;VENTOLIN HFA) 108 (90 BASE) MCG/ACT inhaler Inhale 2 puffs into the lungs every 6 (six) hours as needed for wheezing.   Yes Historical Provider, MD  Aspirin-Acetaminophen-Caffeine (EXCEDRIN PO) Take by mouth. 2 tablets as needed every 6 hours   Yes Historical Provider, MD  Azelastine-Fluticasone (DYMISTA) 137-50 MCG/ACT SUSP Place 1 spray into the nose 2 (two) times daily.   Yes Historical Provider, MD  beclomethasone (QVAR) 40 MCG/ACT inhaler Inhale 2 puffs into the lungs 2 (two) times daily.   Yes Historical Provider, MD  benzonatate (TESSALON) 200 MG capsule TAKE 1 CAPSULE (200 MG TOTAL) BY MOUTH 3 (THREE) TIMES DAILY AS NEEDED FOR COUGH. 12/13/12  Yes Deatra Canter, FNP  Calcium Glycerophosphate (PRELIEF) 340 (65-50) MG (CA-P) TABS Take 1 capsule by mouth 3 (three) times daily before meals. 06/11/12  Yes Kathi Ludwig, MD  Calcium-Magnesium-Vitamin D (CITRACAL CALCIUM+D PO) Take by mouth.   Yes  Historical Provider, MD  chlorpheniramine (CHLOR-TRIMETON) 2 MG/5ML syrup Take 2 mg by mouth every 4 (four) hours as needed for allergies.   Yes Historical Provider, MD  famotidine (PEPCID) 20 MG tablet Take 1 tablet by mouth at bedtime. 11/22/12  Yes Historical Provider, MD  Hydrocodone-Chlorpheniramine 5-4 MG/5ML SOLN Take 5 mLs by mouth every 12 (twelve) hours as needed.   Yes Historical Provider, MD  pantoprazole (PROTONIX) 40 MG tablet Take 40 mg by mouth daily.   Yes Historical Provider, MD  pentosan polysulfate (ELMIRON) 100 MG capsule Take 1 capsule (100 mg total) by mouth 3 (three) times daily before meals. 06/11/12  Yes Kathi Ludwig, MD  polyethylene glycol [facility] / Ethelene Hal) packet May use 2 tablespoons in juice or water daily; or 1 packet. 06/11/12  Yes Kathi Ludwig, MD  Probiotic Product (ALIGN) 4 MG CAPS Take 1 capsule by mouth daily.   Yes Historical Provider, MD  montelukast (SINGULAIR) 10 MG tablet Take 1 tablet (10 mg total) by mouth at bedtime. 07/07/12   Ileana Ladd, MD   Past Medical History  Diagnosis Date  . Asthma   . Osteoporosis   . Acid reflux   . PONV (postoperative nausea and vomiting)   . Tinnitus   . Allergy    Past Surgical History  Procedure Laterality Date  . Abdominal hysterectomy  2000  . 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 Ethmoidectomy  2012  . Septoplasty  1980's  . Cystoscopy with hydrodistension and biopsy N/A 06/11/2012    Procedure:  CYSTOSCOPY/BIOPSY/HYDRODISTENSION with Instillation of Pyridium and Marcaine and Kenalog;  Surgeon: Kathi Ludwig, MD;  Location: [facility];  Service: Urology;  Laterality: N/A;  1 hour requested for this case  BLADDER BIOPSY   Family History  Problem Relation Age of Onset  . Hyperlipidemia Mother   . Cancer Father   . Allergies Father    History   Social History  . Marital Status: Married    Spouse Name: N/A    Number of Children: N/A  . Years of  Education: N/A   Occupational History  . unemployeed    Social History Main Topics  . Smoking status: Never Smoker   . Smokeless tobacco: Not on file  . Alcohol Use: No  . Drug Use: No  . Sexual Activity: Not on file   Other Topics Concern  . Not on file   Social History Narrative  . No narrative on file   ROS-see HPI Constitutional:   No-   weight loss, night sweats, fevers, chills, fatigue, lassitude. HEENT:   + headaches, no-difficulty swallowing, tooth/dental problems, sore throat,       No-  sneezing, itching, ear ache, +nasal congestion, post nasal drip,  CV:  No-   chest pain, orthopnea, PND, swelling in lower extremities, anasarca, dizziness, palpitations Resp: No-   shortness of breath with exertion or at rest.              No-   productive cough,  + non-productive cough,  No- coughing up of blood.              No-   change in color of mucus.  No- wheezing.   Skin: No-   rash or lesions. GI:  +   heartburn, indigestion, abdominal pain, nausea, vomiting, diarrhea,                 change in bowel habits, loss of appetite GU: No-   dysuria, change in color of urine, no urgency or frequency.  No- flank pain. MS:  No-   joint pain or swelling.  No- decreased range of motion.  No- back pain. Neuro-     nothing unusual Psych:  No- change in mood or affect. No depression or anxiety.  No memory loss.  OBJ- Physical Exam General- Alert, Oriented, Affect-appropriate, Distress- none acute, Trim Skin- rash-none, lesions- none, excoriation- none Lymphadenopathy- none Head- atraumatic            Eyes- Gross vision intact, PERRLA, conjunctivae and secretions clear            Ears- Hearing, canals-normal            Nose- Clear, no-Septal dev, mucus, polyps, erosion, perforation             Throat- Mallampati II , mucosa clear- not red , drainage- none, tonsils- atrophic Neck- flexible , trachea midline, no stridor , thyroid nl, carotid no bruit Chest - symmetrical excursion ,  unlabored           Heart/CV- RRR , no murmur , no gallop  , no rub, nl s1 s2                           - JVD- none , edema- none, stasis changes- none, varices- none           Lung- clear to P&A, wheeze- none, cough- none , dullness-none, rub- none  Chest wall-  Abd- tender-no, distended-no, bowel sounds-present, HSM- no Br/ Gen/ Rectal- Not done, not indicated Extrem- cyanosis- none, clubbing, none, atrophy- none, strength- nl Neuro- grossly intact to observation

## 2012-12-31 ENCOUNTER — Telehealth: Payer: Self-pay | Admitting: Internal Medicine

## 2012-12-31 LAB — ALLERGY FULL PROFILE
Alternaria Alternata: <0.1 kU/L
Aspergillus fumigatus, m3: <0.1 kU/L
Bermuda Grass: <0.1 kU/L
Candida Albicans: <0.1 kU/L
Cat Dander: <0.1 kU/L
Curvularia lunata: <0.1 kU/L
D. farinae: <0.1 kU/L
Dog Dander: <0.1 kU/L
Elm IgE: <0.1 kU/L
Fescue: <0.1 kU/L
Goldenrod: <0.1 kU/L
Helminthosporium halodes: <0.1 kU/L
Lamb's Quarters: <0.1 kU/L
Plantain: <0.1 kU/L
Sycamore Tree: <0.1 kU/L
Timothy Grass: <0.1 kU/L

## 2012-12-31 NOTE — Telephone Encounter (Signed)
Notes Recorded by Hermelinda Medicus on 12/31/2012 at 2:36 PM Attempted to call x1 LMTCB ------  Notes Recorded by Waymon Budge, MD on 12/31/2012 at 1:27 PM The allergy antibody levels measeured by this panel were not increased. Will discuss at next ov. Pt advised of results. Carron Curie, CMA

## 2013-01-01 ENCOUNTER — Ambulatory Visit: Payer: Medicare Other

## 2013-01-07 NOTE — Assessment & Plan Note (Signed)
Plan-allergy profile 

## 2013-01-07 NOTE — Assessment & Plan Note (Signed)
Nonspecific cough but I favor cyclical cough with a reflux component or upper airway cough syndrome from nerve irritability Plan- sample Anoro  Flu vax

## 2013-01-16 DIAGNOSIS — N3010 Interstitial cystitis (chronic) without hematuria: Secondary | ICD-10-CM | POA: Insufficient documentation

## 2013-01-16 DIAGNOSIS — R350 Frequency of micturition: Secondary | ICD-10-CM | POA: Insufficient documentation

## 2013-01-16 DIAGNOSIS — R109 Unspecified abdominal pain: Secondary | ICD-10-CM | POA: Insufficient documentation

## 2013-01-16 DIAGNOSIS — M549 Dorsalgia, unspecified: Secondary | ICD-10-CM | POA: Insufficient documentation

## 2013-01-16 HISTORY — DX: Unspecified abdominal pain: R10.9

## 2013-01-30 ENCOUNTER — Ambulatory Visit: Payer: Medicare Other | Admitting: Pharmacist

## 2013-01-30 ENCOUNTER — Ambulatory Visit (INDEPENDENT_AMBULATORY_CARE_PROVIDER_SITE_OTHER): Payer: Medicare Other

## 2013-01-30 ENCOUNTER — Telehealth: Payer: Self-pay | Admitting: Internal Medicine

## 2013-01-30 ENCOUNTER — Ambulatory Visit (INDEPENDENT_AMBULATORY_CARE_PROVIDER_SITE_OTHER): Payer: Medicare Other | Admitting: General Practice

## 2013-01-30 ENCOUNTER — Encounter: Payer: Self-pay | Admitting: Pharmacist

## 2013-01-30 ENCOUNTER — Encounter: Payer: Self-pay | Admitting: General Practice

## 2013-01-30 VITALS — BP 105/67 | HR 71 | Temp 97.0°F | Ht 69.0 in | Wt 110.0 lb

## 2013-01-30 VITALS — BP 110/70 | Temp 98.3°F | Ht 69.0 in | Wt 110.0 lb

## 2013-01-30 DIAGNOSIS — M810 Age-related osteoporosis without current pathological fracture: Secondary | ICD-10-CM | POA: Insufficient documentation

## 2013-01-30 DIAGNOSIS — R05 Cough: Secondary | ICD-10-CM

## 2013-01-30 DIAGNOSIS — J45909 Unspecified asthma, uncomplicated: Secondary | ICD-10-CM

## 2013-01-30 DIAGNOSIS — Z780 Asymptomatic menopausal state: Secondary | ICD-10-CM

## 2013-01-30 DIAGNOSIS — N951 Menopausal and female climacteric states: Secondary | ICD-10-CM

## 2013-01-30 DIAGNOSIS — R059 Cough, unspecified: Secondary | ICD-10-CM

## 2013-01-30 DIAGNOSIS — J45901 Unspecified asthma with (acute) exacerbation: Secondary | ICD-10-CM

## 2013-01-30 MED ORDER — IPRATROPIUM BROMIDE 0.02 % IN SOLN
0.5000 mg | Freq: Once | RESPIRATORY_TRACT | Status: AC
  Administered 2013-01-30: 0.5 mg via RESPIRATORY_TRACT

## 2013-01-30 MED ORDER — ALBUTEROL SULFATE (2.5 MG/3ML) 0.083% IN NEBU
2.5000 mg | INHALATION_SOLUTION | Freq: Once | RESPIRATORY_TRACT | Status: AC
  Administered 2013-01-30: 2.5 mg via RESPIRATORY_TRACT

## 2013-01-30 MED ORDER — IPRATROPIUM BROMIDE 0.02 % IN SOLN: 0.5000 mg | RESPIRATORY_TRACT | Status: DC

## 2013-01-30 NOTE — Patient Instructions (Signed)
Asthma, Adult Asthma is a recurring condition in which the airways tighten and narrow. Asthma can make it difficult to breathe. It can cause coughing, wheezing, and shortness of breath. Asthma episodes (also called asthma attacks) range from minor to life-threatening. Asthma cannot be cured, but medicines and lifestyle changes can help control it. CAUSES Asthma is believed to be caused by inherited (genetic) and environmental factors, but its exact cause is unknown. Asthma may be triggered by allergens, lung infections, or irritants in the air. Asthma triggers are different for each person. Common triggers include:   Animal dander.  Dust mites.  Cockroaches.  Pollen from trees or grass.  Mold.  Smoke.  Air pollutants such as dust, household cleaners, hair sprays, aerosol sprays, paint fumes, strong chemicals, or strong odors.  Cold air, weather changes, and winds (which increase molds and pollens in the air).  Strong emotional expressions such as crying or laughing hard.  Stress.  Certain medicines (such as aspirin) or types of drugs (such as beta-blockers).  Sulfites in foods and drinks. Foods and drinks that may contain sulfites include dried fruit, potato chips, and sparkling grape juice.  Infections or inflammatory conditions such as the flu, a cold, or an inflammation of the nasal membranes (rhinitis).  Gastroesophageal reflux disease (GERD).  Exercise or strenuous activity. SYMPTOMS Symptoms may occur immediately after asthma is triggered or many hours later. Symptoms include:  Wheezing.  Excessive nighttime or early morning coughing.  Frequent or severe coughing with a common cold.  Chest tightness.  Shortness of breath. DIAGNOSIS  The diagnosis of asthma is made by a review of your medical history and a physical exam. Tests may also be performed. These may include:  Lung function studies. These tests show how much air you breath in and out.  Allergy  tests.  Imaging tests such as X-rays. TREATMENT  Asthma cannot be cured, but it can usually be controlled. Treatment involves identifying and avoiding your asthma triggers. It also involves medicines. There are 2 classes of medicine used for asthma treatment:   Controller medicines. These prevent asthma symptoms from occurring. They are usually taken every day.  Reliever or rescue medicines. These quickly relieve asthma symptoms. They are used as needed and provide short-term relief. Your health care provider will help you create an asthma action plan. An asthma action plan is a written plan for managing and treating your asthma attacks. It includes a list of your asthma triggers and how they may be avoided. It also includes information on when medicines should be taken and when their dosage should be changed. An action plan may also involve the use of a device called a peak flow meter. A peak flow meter measures how well the lungs are working. It helps you monitor your condition. HOME CARE INSTRUCTIONS   Take medicine as directed by your health care provider. Speak with your health care provider if you have questions about how or when to take the medicines.  Use a peak flow meter as directed by your health care provider. Record and keep track of readings.  Understand and use the action plan to help minimize or stop an asthma attack without needing to seek medical care.  Control your home environment in the following ways to help prevent asthma attacks:  Do not smoke. Avoid being exposed to secondhand smoke.  Change your heating and air conditioning filter regularly.  Limit your use of fireplaces and wood stoves.  Get rid of pests (such as roaches and   mice) and their droppings.  Throw away plants if you see mold on them.  Clean your floors and dust regularly. Use unscented cleaning products.  Try to have someone else vacuum for you regularly. Stay out of rooms while they are being  vacuumed and for a short while afterward. If you vacuum, use a dust mask from a hardware store, a double-layered or microfilter vacuum cleaner bag, or a vacuum cleaner with a HEPA filter.  Replace carpet with wood, tile, or vinyl flooring. Carpet can trap dander and dust.  Use allergy-proof pillows, mattress covers, and box spring covers.  Wash bed sheets and blankets every week in hot water and dry them in a dryer.  Use blankets that are made of polyester or cotton.  Clean bathrooms and kitchens with bleach. If possible, have someone repaint the walls in these rooms with mold-resistant paint. Keep out of the rooms that are being cleaned and painted.  Wash hands frequently. SEEK MEDICAL CARE IF:   You have wheezing, shortness of breath, or a cough even if taking medicine to prevent attacks.  The colored mucus you cough up (sputum) is thicker than usual.  Your sputum changes from clear or white to yellow, green, gray, or bloody.  You have any problems that may be related to the medicines you are taking (such as a rash, itching, swelling, or trouble breathing).  You are using a reliever medicine more than 2 3 times per week.  Your peak flow is still at 50 79% of you personal best after following your action plan for 1 hour. SEEK IMMEDIATE MEDICAL CARE IF:   You seem to be getting worse and are unresponsive to treatment during an asthma attack.  You are short of breath even at rest.  You get short of breath when doing very little physical activity.  You have difficulty eating, drinking, or talking due to asthma symptoms.  You develop chest pain.  You develop a fast heartbeat.  You have a bluish color to your lips or fingernails.  You are lightheaded, dizzy, or faint.  Your peak flow is less than 50% of your personal best.  You have a fever or persistent symptoms for more than 2 3 days.  You have a fever and symptoms suddenly get worse. MAKE SURE YOU:   Understand these  instructions.  Will watch your condition.  Will get help right away if you are not doing well or get worse. Document Released: 03/14/2005 Document Revised: 11/14/2012 Document Reviewed: 10/11/2012 Exit[employer] Patient Information 2014 [employer].  

## 2013-01-30 NOTE — Telephone Encounter (Signed)
Per CY-try using Anoro a few more days.

## 2013-01-30 NOTE — Telephone Encounter (Signed)
Pt advised. Apolonio Cutting, CMA  

## 2013-01-30 NOTE — Telephone Encounter (Signed)
Called spoke with patient who c/o mostly dry cough, that occasionally produces white mucus, eyes are itching and red, some increased SOB, chest tightness, fatigue, bitter taste in mouth that pt reports is worse since last ov onset earlier this week.  Pt denies any f/c/s, hemoptysis, nausea, vomiting, diarrhea, head congestion, PND, wheezing.  Pt did see her PCP today w/ a cxr that was clear (ov note and cxr are in epic) and albuterol/ipratropium neb in office.  Pt is requesting an ov w/ CDY if possible -- no openings.  Per the last ov note /w CDY on 10.3.14, pt was to begin Anoro 1 puff once daily.  Per pt, she did not begin this medication until yesterday and reports that her cough is a little worse today and she noticed a powdery residue in her mouth after using (verified that she rinsed well after use).   Dr Maple Hudson please advise, thank you. [facility] Allergies  Allergen Reactions  . Avelox [Moxifloxacin Hcl In Nacl] Other (See Comments)    Tremors   . Cedax [Ceftibuten] Other (See Comments)    tremors  . Clindamycin/Lincomycin     tachycardia  . Levofloxacin Other (See Comments)    Feels dehydrated  . Singulair [Montelukast Sodium] Other (See Comments)    Numbness and tingling in hand.  . Sulfonamide Derivatives Other (See Comments)    Gi upset

## 2013-01-30 NOTE — Patient Instructions (Signed)

## 2013-01-30 NOTE — Progress Notes (Signed)
Patient ID: Kristy Garza, female   DOB: 05-09-1950, 62 y.o.   MRN: [id number]   [facility] Current Height: Height: 5\' 9"  (175.3 cm)      Max Lifetime Height:  5 9.5" Current Weight: Weight: 110 lb (49.896 kg)       Ethnicity:Caucasian  BP: BP: 110/70 mmHg      HPI: Does pt already have a diagnosis of:   Osteoporosis?  Yes  Back Pain?  Yes       Kyphosis?  No Prior fracture?  Yes - pelvis and collar bone/ calvicle Med(s) for Osteoporosis/Osteopenia:  prolia 60mg  SQ q 6 months (has had 1 dose in June 2014) Med(s) previously tried for Osteoporosis/Osteopenia:  evisita , Forteo-felt like she had flu, Fosamax - increased GI problems                                                              PMH: Age at menopause:  Surgical in 2000 Hysterectomy?  Yes Oophorectomy?  Yes HRT? Took premarin for a short period  Steroid Use?  Yes - Current.  Type/duration: inhaled corticosteriods Thyroid med?  No History of cancer?  No History of digestive disorders (ie Crohn's)?  Yes Current or previous eating disorders?  No Last Vitamin D Result:  49 (2012) Last GFR Result:  90 (11/2012)   FH/SH: Family history of osteoporosis?  Yes - mother Parent with history of hip fracture?  No Family history of breast cancer?  No Exercise?  Yes - daily walking Smoking?  No Alcohol?  No    Calcium Assessment Calcium Intake  # of servings/day  Calcium mg  Milk (8 oz) 0  x  300  = 0  Yogurt (4 oz) 0 x  200 = 0  Cheese (1 oz) 0 x  200 = 0  Other Calcium sources   250mg   Ca supplement 600mg  bid = 1200mg    Estimated calcium intake per day 1450mg     DEXA Results Date of Test T-Score for AP Spine L1-L4 T-Score for Total Left Hip T-Score for Total Right Hip  01/30/2013 -3.0 -3.4 -4.4  06/02/2010 -3.5 -3.0 -3.6  03/06/2006 -3.1 -3.1 -3.5          Assessment: osteoporosis  Recommendations: 1. Continue Prolia 60mg  SQ q 6 months 2.  continue calcium 1200mg  daily through supplementation or  diet.  3.  recommend weight bearing exercise - 30 minutes at least 4 days  per week.   4.  Counseled and educated about fall risk and prevention.   ** patient c/o cough.  Triage to Philomena Doheny, NP Recheck DEXA:  1 year (after has had at least 1 year of Prolia treatment)  Time spent counseling patient:  20 minutes   Henrene Pastor, PharmD, CPP

## 2013-01-30 NOTE — Progress Notes (Signed)
  Subjective:    Patient ID: Kristy Garza, female    DOB: Nov 02, 1950, 62 y.o.   MRN: [id number]  Cough This is a chronic problem. The current episode started more than 1 year ago. The problem has been gradually worsening. The cough is non-productive. Associated symptoms include nasal congestion. Pertinent negatives include no chest pain, chills, fever, headaches, postnasal drip, sore throat, shortness of breath or wheezing. The symptoms are aggravated by lying down and cold air. She has tried ipratropium inhaler for the symptoms. Her past medical history is significant for asthma. There is no history of pneumonia.  Reports being seen by both an allergist and pulmonologist. She reports last visiting the pulmonologist in June, but didn't start using the inhaler prescribed until last night, due to dry mouth.     Review of Systems  Constitutional: Negative for fever and chills.  HENT: Negative for postnasal drip and sore throat.   Respiratory: Positive for cough. Negative for chest tightness, shortness of breath and wheezing.   Cardiovascular: Negative for chest pain and palpitations.  Genitourinary: Negative for difficulty urinating.  Neurological: Negative for dizziness, weakness and headaches.       Objective:   Physical Exam  Constitutional: She is oriented to person, place, and time. She appears well-developed and well-nourished.  Cardiovascular: Normal rate, regular rhythm and normal heart sounds.   Pulmonary/Chest: Effort normal. She has decreased breath sounds in the right upper field and the left upper field.  Neurological: She is alert and oriented to person, place, and time.  Skin: Skin is warm and dry.  Psychiatric: She has a normal mood and affect.   [facility] reading (PRIMARY) by Coralie Keens, FNP-C, hyperinflation noted.                                         Assessment & Plan:  1. Cough  - DG Chest 2 View; Future - ipratropium (ATROVENT) nebulizer solution 0.5  mg; Take 2.5 mLs (0.5 mg total) by nebulization every 4 (four) hours. - albuterol (PROVENTIL) (2.5 MG/3ML) 0.083% nebulizer solution 2.5 mg; Take 3 mLs (2.5 mg total) by nebulization once.  2. Asthma, chronic  - ipratropium (ATROVENT) nebulizer solution 0.5 mg; Take 2.5 mLs (0.5 mg total) by nebulization every 4 (four) hours. - albuterol (PROVENTIL) (2.5 MG/3ML) 0.083% nebulizer solution 2.5 mg; Take 3 mLs (2.5 mg total) by nebulization once. -discussed importance of using inhalers as prescribed -maintain follow up appointments with allergist and pulmonologist -RTO if symptoms worsen or seek emergency medical treatment -Patient verbalized understanding -Coralie Keens, FNP-C

## 2013-01-31 ENCOUNTER — Telehealth: Payer: Self-pay | Admitting: Internal Medicine

## 2013-01-31 NOTE — Telephone Encounter (Signed)
I spoke with pt. She wanted to know if she could take tussionex since she had some left. I advised her if it helps her cough then that's fine. She wanted to make sure since she was on anoro. Nothing further needed

## 2013-02-01 ENCOUNTER — Telehealth: Payer: Self-pay | Admitting: Internal Medicine

## 2013-02-01 NOTE — Telephone Encounter (Signed)
I spoke with pt. She is wanting to know if she could take mucinex for congestion. I advised her since she does not have an allergy to this then yes. Nothing further needed

## 2013-02-04 ENCOUNTER — Telehealth: Payer: Self-pay | Admitting: General Practice

## 2013-02-04 NOTE — Telephone Encounter (Signed)
Mae can you review results

## 2013-02-04 NOTE — Telephone Encounter (Signed)
Please inform patient that chest xray results were: Emphysema without acute cardiopulmonary disease. As discussed, she should follow up with pulmonologist as scheduled.

## 2013-02-05 NOTE — Telephone Encounter (Signed)
Patient aware.

## 2013-02-06 ENCOUNTER — Telehealth: Payer: Self-pay | Admitting: Internal Medicine

## 2013-02-06 NOTE — Telephone Encounter (Signed)
Called, spoke with pt.  We have scheduled her to see CDY on tomorrow, Nov 13 at 10 am.  Pt aware and voiced no further questions or concerns at this time.

## 2013-02-06 NOTE — Telephone Encounter (Signed)
I spoke with pt. She reports she has a non productive cough. Very rare she gets phlem up and is clear. She was clearing throat very often on the phone. She has been taking mucinex and tussin. She is requesting further recs. Please advise Dr. Maple Hudson thanks Last OV 12/28/12  Allergies  Allergen Reactions  . Avelox [Moxifloxacin Hcl In Nacl] Other (See Comments)    Tremors   . Cedax [Ceftibuten] Other (See Comments)    tremors  . Clindamycin/Lincomycin     tachycardia  . Levofloxacin Other (See Comments)    Feels dehydrated  . Singulair [Montelukast Sodium] Other (See Comments)    Numbness and tingling in hand.  . Sulfonamide Derivatives Other (See Comments)    Gi upset

## 2013-02-06 NOTE — Telephone Encounter (Signed)
Per Cy-see if patient can come in to be seen tomorrow by him or TP. Thanks.

## 2013-02-07 ENCOUNTER — Other Ambulatory Visit: Payer: Medicare Other

## 2013-02-07 ENCOUNTER — Ambulatory Visit (INDEPENDENT_AMBULATORY_CARE_PROVIDER_SITE_OTHER): Payer: Medicare Other | Admitting: Internal Medicine

## 2013-02-07 ENCOUNTER — Encounter: Payer: Self-pay | Admitting: Internal Medicine

## 2013-02-07 VITALS — BP 112/68 | HR 89 | Ht 69.0 in | Wt 108.4 lb

## 2013-02-07 DIAGNOSIS — J441 Chronic obstructive pulmonary disease with (acute) exacerbation: Secondary | ICD-10-CM

## 2013-02-07 DIAGNOSIS — R059 Cough, unspecified: Secondary | ICD-10-CM

## 2013-02-07 DIAGNOSIS — K219 Gastro-esophageal reflux disease without esophagitis: Secondary | ICD-10-CM

## 2013-02-07 DIAGNOSIS — J309 Allergic rhinitis, unspecified: Secondary | ICD-10-CM

## 2013-02-07 DIAGNOSIS — Z23 Encounter for immunization: Secondary | ICD-10-CM

## 2013-02-07 DIAGNOSIS — R05 Cough: Secondary | ICD-10-CM

## 2013-02-07 DIAGNOSIS — J302 Other seasonal allergic rhinitis: Secondary | ICD-10-CM

## 2013-02-07 NOTE — Progress Notes (Signed)
12/28/12- 62 yoF never smoker-Dr Wong/ [facility]-last seen by RB 2009; seen at [facility] chest 2002; chronic cough  Has seen Dr. Delton Coombes here and Dr Lucie Leather in the past. Complains of allergies, asthma, chronic cough for over 20 years. Help with inhalers and cough medicines. Using rescue inhaler once or twice a day. Not using Qvar now. Particularly bad in spring and fall. [place] smells musty when it rains. She thinks in retrospect that allergy shots in the past with more than she realized. She had one episode while on allergy shots, when she had throat tightness which scared her so she quit.. Occasional cough at night. Previous PFTs are filed, but as recently as 2009 showed reversible obstructive airways disease and small airways. Singulair blamed for paresthesias. Son smokes outside the home. History of sinus surgery, vocal cord polyp. Father had allergies and asthma  02/07/13- 62 yoF never smoker-Dr Wong/ [facility]-last seen by RB 2009; seen at [facility] chest 2002; followed for chronic cough, allergic rhinitis  ACUTE VISIT:  Cough worsening over the past month.  Cough dry-persistant  cough worse in the past week. Losing weight attributed to poor appetite which she blames on acid indigestion despite pantoprazole/ elevated HOB. She's worried the chest x-ray suggests emphysema. Anoro blamed for chest pain. CXR 01/30/13 CLINICAL DATA: Cough.  EXAM:  CHEST 2 VIEW  COMPARISON: Chest radiograph 05/15/2012.  FINDINGS:  The cardiopericardial silhouette and mediastinal contours are within  normal limits. Hyperinflation and severe emphysema is present. No  airspace disease or pleural effusion is identified.  IMPRESSION:  Emphysema without acute cardiopulmonary disease.  Electronically Signed  By: Andreas Newport M.D.  On: 01/30/2013 13:00   ROS-see HPI Constitutional:   No-   weight loss, night sweats, fevers, chills, fatigue, lassitude. HEENT:   + headaches, no-difficulty swallowing, tooth/dental  problems, sore throat,       No-  sneezing, itching, ear ache, +nasal congestion, post nasal drip,  CV:  No-   chest pain, orthopnea, PND, swelling in lower extremities, anasarca, dizziness, palpitations Resp: No-   shortness of breath with exertion or at rest.              No-   productive cough,  + non-productive cough,  No- coughing up of blood.              No-   change in color of mucus.  No- wheezing.   Skin: No-   rash or lesions. GI:  +   heartburn, indigestion, + loss of appetite GU: . MS:  No-   joint pain or swelling.   Neuro-     nothing unusual Psych:  No- change in mood or affect. No depression or anxiety.  No memory loss.  OBJ- Physical Exam General- Alert, Oriented, Affect-appropriate, Distress- none acute, Trim Skin- rash-none, lesions- none, excoriation- none Lymphadenopathy- none Head- atraumatic            Eyes- Gross vision intact, PERRLA, conjunctivae and secretions clear            Ears- Hearing, canals-normal            Nose- Clear, no-Septal dev, mucus, polyps, erosion, perforation             Throat- Mallampati II , mucosa clear- not red , drainage- none, tonsils- atrophic Neck- flexible , trachea midline, no stridor , thyroid nl, carotid no bruit Chest - symmetrical excursion , unlabored           Heart/CV-  RRR , no murmur , no gallop  , no rub, nl s1 s2                           - JVD- none , edema- none, stasis changes- none, varices- none           Lung- clear to P&A, wheeze- none, cough+ with deep breath , dullness-none, rub- none. Long/thin thorax.           Chest wall-  Abd-  Br/ Gen/ Rectal- Not done, not indicated Extrem- cyanosis- none, clubbing, none, atrophy- none, strength- nl Neuro- grossly intact to observation

## 2013-02-07 NOTE — Patient Instructions (Addendum)
Order- schedule PFT   Dx COPD  Pneumonia conjugate vaccine Prevnar 13  Schedule allergy skin testing- you will need to be off all antihistamines for 3 days before testing  Sample Tudorza inhaler    1 puff, twice daily. Add this to your current meds, use it up, and see what you think about your breathing   Order- lab- a1 AT    Dx COPD

## 2013-02-11 NOTE — Progress Notes (Signed)
Quick Note:  Pt aware of results. ______ 

## 2013-02-12 ENCOUNTER — Telehealth: Payer: Self-pay | Admitting: Internal Medicine

## 2013-02-12 NOTE — Telephone Encounter (Signed)
Pt had called back to the office from a message, unsure of who called the pt or the nature of the call.  The pt wanted verification of how to use her inhalers properly, and I went over that with her.  She also had questions about her lab results re: the Alpha 1 testing.  There was a note from 11/13 that Katie had called this pt and went over her results, but I went over these results with her again.  The pt had no further questions, and I told her that we would call her if anything else comes up, and for her to call us if she has any questions or issues.  No further action needed at this time. Meryem Haertel L

## 2013-02-24 NOTE — Assessment & Plan Note (Addendum)
Not bad this fall, but she asks reassessment , wanting allergy skin tests to compare with past results. Plan-schedule allergy skin testing

## 2013-02-24 NOTE — Assessment & Plan Note (Signed)
Inadequate control. Plan-add omeprazole before supper for the next month and followup with previous care manager for this problem

## 2013-02-24 NOTE — Assessment & Plan Note (Addendum)
CXR questions emphysema in this nonsmoker. Plan-schedule PFT, sample Tudorza, a1AT,pneumonia vaccine conjugate 13

## 2013-02-27 ENCOUNTER — Telehealth: Payer: Self-pay | Admitting: Internal Medicine

## 2013-02-27 ENCOUNTER — Encounter: Payer: Self-pay | Admitting: General Practice

## 2013-02-27 ENCOUNTER — Ambulatory Visit (INDEPENDENT_AMBULATORY_CARE_PROVIDER_SITE_OTHER): Payer: Medicare Other | Admitting: General Practice

## 2013-02-27 VITALS — BP 107/66 | HR 81 | Temp 98.1°F | Ht 69.0 in | Wt 110.5 lb

## 2013-02-27 DIAGNOSIS — J329 Chronic sinusitis, unspecified: Secondary | ICD-10-CM

## 2013-02-27 MED ORDER — PREDNISONE (PAK) 10 MG PO TABS: ORAL_TABLET | ORAL | Status: DC

## 2013-02-27 MED ORDER — DOXYCYCLINE HYCLATE 100 MG PO TABS: 100.0000 mg | ORAL_TABLET | Freq: Two times a day (BID) | ORAL | Status: DC

## 2013-02-27 NOTE — Telephone Encounter (Signed)
I called and spoke with pt. She reports she saw PCP today and was dx with sinusitis. Was giving doxy and prednisone. She is scheduled for PFT 03/05/13 and wants to know if she still needs to have this done. Please advise Dr. Maple Hudson thanks

## 2013-02-27 NOTE — Telephone Encounter (Signed)
I called and made pt aware. Nothing further needed 

## 2013-02-27 NOTE — Telephone Encounter (Signed)
If she is feeling tight in chest with wheeze or cough around Dec 6-7 then suggest she reschedule. She can wait that long to see how she does.

## 2013-02-27 NOTE — Progress Notes (Signed)
   Subjective:    Patient ID: Kristy Garza, female    DOB: [birth year], 62 y.o.   MRN: [id number]  Sinusitis This is a recurrent problem. The current episode started yesterday. The problem has been gradually worsening since onset. There has been no fever. Associated symptoms include congestion, coughing and sinus pressure. Pertinent negatives include no chills, headaches or sore throat. Past treatments include nothing.      Review of Systems  Constitutional: Negative for fever and chills.  HENT: Positive for congestion, postnasal drip and sinus pressure. Negative for sore throat.   Respiratory: Positive for cough. Negative for chest tightness and wheezing.   Cardiovascular: Negative for chest pain and palpitations.  Neurological: Negative for dizziness, weakness and headaches.       Objective:   Physical Exam  Constitutional: She appears well-developed and well-nourished.  HENT:  Head: Normocephalic and atraumatic.  Nose: Right sinus exhibits maxillary sinus tenderness and frontal sinus tenderness. Left sinus exhibits maxillary sinus tenderness and frontal sinus tenderness.  Mouth/Throat: Posterior oropharyngeal erythema present.  Cardiovascular: Normal rate, regular rhythm and normal heart sounds.   No murmur heard. Pulmonary/Chest: Effort normal and breath sounds normal. No respiratory distress. She exhibits no tenderness.  Neurological: She is alert.  Skin: Skin is warm and dry. No rash noted.  Psychiatric: She has a normal mood and affect.          Assessment & Plan:  1. Recurrent sinusitis - predniSONE (STERAPRED UNI-PAK) 10 MG tablet; Take as directed  Dispense: 21 tablet; Refill: 0 - doxycycline (VIBRA-TABS) 100 MG tablet; Take 1 tablet (100 mg total) by mouth 2 (two) times daily.  Dispense: 14 tablet; Refill: 0 -increase fluids -new toothbrush 3 days after starting antibiotics -maintain scheduled appointments with allergist and pulmonologist -RTO if symptoms  worsen or unresolved -Patient verbalized understanding Coralie Keens, FNP-C

## 2013-02-27 NOTE — Patient Instructions (Signed)

## 2013-03-01 ENCOUNTER — Ambulatory Visit: Payer: Medicare Other | Admitting: Internal Medicine

## 2013-03-04 ENCOUNTER — Ambulatory Visit (INDEPENDENT_AMBULATORY_CARE_PROVIDER_SITE_OTHER): Payer: Medicare Other | Admitting: Family Medicine

## 2013-03-04 ENCOUNTER — Telehealth: Payer: Self-pay | Admitting: Internal Medicine

## 2013-03-04 ENCOUNTER — Encounter: Payer: Self-pay | Admitting: Family Medicine

## 2013-03-04 VITALS — BP 116/68 | HR 92 | Temp 98.6°F | Ht 69.0 in | Wt 112.0 lb

## 2013-03-04 DIAGNOSIS — R059 Cough, unspecified: Secondary | ICD-10-CM

## 2013-03-04 DIAGNOSIS — R05 Cough: Secondary | ICD-10-CM

## 2013-03-04 DIAGNOSIS — J069 Acute upper respiratory infection, unspecified: Secondary | ICD-10-CM

## 2013-03-04 MED ORDER — BENZONATATE 100 MG PO CAPS: 100.0000 mg | ORAL_CAPSULE | Freq: Three times a day (TID) | ORAL | Status: DC | PRN

## 2013-03-04 MED ORDER — CLARITHROMYCIN 500 MG PO TABS: 500.0000 mg | ORAL_TABLET | Freq: Two times a day (BID) | ORAL | Status: DC

## 2013-03-04 NOTE — Telephone Encounter (Signed)
Pt aware of recs and rx sent. Nothing further needed 

## 2013-03-04 NOTE — Telephone Encounter (Signed)
Offer biaxin 500 mg, # 20, 1 twice daily after meals    Also suggest Mucinex DM

## 2013-03-04 NOTE — Telephone Encounter (Signed)
I called and spoke with pt. She c/o dry cough, chest and nasal congestion, slight chest tx, bliateral ear pain, pressure in cheeks. She saw PCP 02/27/13 and was giving pred taper and doxy. Does not feel like this has helped. Please advise Dr. Maple Hudson thanks  Allergies  Allergen Reactions  . Avelox [Moxifloxacin Hcl In Nacl] Other (See Comments)    Tremors   . Cedax [Ceftibuten] Other (See Comments)    tremors  . Clindamycin/Lincomycin     tachycardia  . Levofloxacin Other (See Comments)    Feels dehydrated  . Singulair [Montelukast Sodium] Other (See Comments)    Numbness and tingling in hand.  . Sulfonamide Derivatives Other (See Comments)    Gi upset

## 2013-03-04 NOTE — Progress Notes (Signed)
   Subjective:    Patient ID: Kristy Garza, female    DOB: Jun 08, 1950, 62 y.o.   MRN: [id number]  HPI URI Symptoms Onset: 1 week  Description: rhinorrhea, nasal congestion, cough  Modifying factors:  Was seen last week at beginning of sxs. Placed on doxy. This has been ineffective in helping sxs. Baseline hx/o COPD and multiple medical problems. Just completed course of prednisone. Also multiple medication allergies.   Symptoms Nasal discharge: yes Fever: no Sore throat: no Cough: yes Wheezing: no Ear pain: no GI symptoms: no Sick contacts: no  Red Flags  Stiff neck: no Dyspnea: no Rash: no Swallowing difficulty: no  Sinusitis Risk Factors Headache/face pain: no Double sickening: no tooth pain: no  Allergy Risk Factors Sneezing: no Itchy scratchy throat: no Seasonal symptoms: no  Flu Risk Factors Headache: no muscle aches: n severe fatigue: no     Review of Systems  All other systems reviewed and are negative.       Objective:   Physical Exam  Constitutional:  Underweight   HENT:  Head: Normocephalic and atraumatic.  Right Ear: External ear normal.  Left Ear: External ear normal.  +nasal erythema, rhinorrhea bilaterally, + post oropharyngeal erythema    Eyes: Conjunctivae are normal. Pupils are equal, round, and reactive to light.  Neck: Normal range of motion. Neck supple.  Cardiovascular: Normal rate, regular rhythm and normal heart sounds.   Pulmonary/Chest: Effort normal and breath sounds normal. She has no wheezes.  Abdominal: Soft.  Musculoskeletal: Normal range of motion.  Neurological: She is alert.  Skin: Skin is warm.          Assessment & Plan:  URI (upper respiratory infection)  Cough - Plan: benzonatate (TESSALON) 100 MG capsule  Suspect likely viral process Tessalon perles for cough  Discussed why doxycycline is ineffective in viral infections at length with pt. May discontinue now.  Baseline COPD stable s/p recent  glucocorticoid course.  Discussed infectious and resp red flags at length.  Follow up as needed.

## 2013-03-08 ENCOUNTER — Telehealth: Payer: Self-pay | Admitting: Family Medicine

## 2013-03-08 NOTE — Telephone Encounter (Signed)
Pt called her ent and no longer needs for [name] to call her back.

## 2013-03-11 DIAGNOSIS — K219 Gastro-esophageal reflux disease without esophagitis: Secondary | ICD-10-CM | POA: Insufficient documentation

## 2013-03-13 ENCOUNTER — Telehealth: Payer: Self-pay | Admitting: Family Medicine

## 2013-03-15 ENCOUNTER — Encounter: Payer: Self-pay | Admitting: Internal Medicine

## 2013-03-15 ENCOUNTER — Telehealth: Payer: Self-pay | Admitting: Internal Medicine

## 2013-03-15 ENCOUNTER — Ambulatory Visit: Payer: Medicare Other | Admitting: Physician Assistant

## 2013-03-15 ENCOUNTER — Ambulatory Visit (INDEPENDENT_AMBULATORY_CARE_PROVIDER_SITE_OTHER): Payer: Medicare Other | Admitting: Internal Medicine

## 2013-03-15 VITALS — BP 92/62 | HR 94 | Ht 69.0 in | Wt 114.0 lb

## 2013-03-15 DIAGNOSIS — R252 Cramp and spasm: Secondary | ICD-10-CM

## 2013-03-15 DIAGNOSIS — R05 Cough: Secondary | ICD-10-CM

## 2013-03-15 DIAGNOSIS — J45909 Unspecified asthma, uncomplicated: Secondary | ICD-10-CM

## 2013-03-15 DIAGNOSIS — J329 Chronic sinusitis, unspecified: Secondary | ICD-10-CM

## 2013-03-15 DIAGNOSIS — R059 Cough, unspecified: Secondary | ICD-10-CM

## 2013-03-15 DIAGNOSIS — R042 Hemoptysis: Secondary | ICD-10-CM

## 2013-03-15 DIAGNOSIS — J440 Chronic obstructive pulmonary disease with acute lower respiratory infection: Secondary | ICD-10-CM

## 2013-03-15 MED ORDER — ALPRAZOLAM 0.5 MG PO TABS: 0.5000 mg | ORAL_TABLET | Freq: Three times a day (TID) | ORAL | Status: DC | PRN

## 2013-03-15 MED ORDER — LEVALBUTEROL HCL 0.63 MG/3ML IN NEBU
0.6300 mg | INHALATION_SOLUTION | Freq: Once | RESPIRATORY_TRACT | Status: AC
  Administered 2013-03-15: 0.63 mg via RESPIRATORY_TRACT

## 2013-03-15 MED ORDER — METHYLPREDNISOLONE ACETATE 80 MG/ML IJ SUSP
80.0000 mg | Freq: Once | INTRAMUSCULAR | Status: AC
  Administered 2013-03-15: 80 mg via INTRAMUSCULAR

## 2013-03-15 NOTE — Patient Instructions (Signed)
Script for xanax    Dr Modesto Charon can be in charge of this one long term.  Finish the keflex antibiotic  Neb nasal neo  Depo 80  Try otc Slow Mag for potassium and magnesium related leg cramps

## 2013-03-15 NOTE — Telephone Encounter (Signed)
Spoke with patient-states she had bladder surgery on Monday and was given abx Cephalexin 500 mg TID (only taking BID). Pt noted that she has been coughing a lot and has noticed about 3 times blood tinged sputum(amouth of the size of thumb tip). Pt states she is having sinus/facial pressure as well. Pt not having any trouble breathing. After converstation with patient and offering an appt with CY at 4 pm today;pt told me she could not get to [place] as her husband has the car. Pt then stated she will call her PCP now and try to get an appt with them. If unable to get appt then patient is to call me back asap to get recs from CY prior to the weekend.   Will sign off on message and forward to CY as FYI.

## 2013-03-15 NOTE — Progress Notes (Signed)
12/28/12- 62 yoF never smoker-Dr Wong/ [facility]-last seen by RB 2009; seen at [facility] chest 2002; chronic cough  Has seen Dr. Delton Coombes here and Dr Lucie Leather in the past. Complains of allergies, asthma, chronic cough for over 20 years. Help with inhalers and cough medicines. Using rescue inhaler once or twice a day. Not using Qvar now. Particularly bad in spring and fall. Lives [place] old farmhouse smells musty when it rains. She thinks in retrospect that allergy shots in the past with more than she realized. She had one episode while on allergy shots, when she had throat tightness which scared her so she quit.. Occasional cough at night. Previous PFTs are filed, but as recently as 2009 showed reversible obstructive airways disease and small airways. Singulair blamed for paresthesias. Son smokes outside the home. History of sinus surgery, vocal cord polyp. Father had allergies and asthma  02/07/13- 62 yoF never smoker-Dr Wong/ [facility]-last seen by RB 2009; seen at [facility] chest 2002; followed for chronic cough, allergic rhinitis  ACUTE VISIT:  Cough worsening over the past month.  Cough dry-persistant  cough worse in the past week. Losing weight attributed to poor appetite which she blames on acid indigestion despite pantoprazole/ elevated HOB. She's worried the chest x-ray suggests emphysema. Anoro blamed for chest pain. CXR 01/30/13 CLINICAL DATA: Cough.  EXAM:  CHEST 2 VIEW  COMPARISON: Chest radiograph 05/15/2012.  FINDINGS:  The cardiopericardial silhouette and mediastinal contours are within  normal limits. Hyperinflation and severe emphysema is present. No  airspace disease or pleural effusion is identified.  IMPRESSION:  Emphysema without acute cardiopulmonary disease.  Electronically Signed  By: Andreas Newport M.D.  On: 01/30/2013 13:00  03/15/13- 62 yoF never smoker-Dr Wong/ [facility]-last seen by RB 2009; seen at [facility] chest 2002; followed for chronic cough, allergic rhinitis  ACUTE VISIT: Cough since 01/2013.  Saw her PCP and was given prednisone with no relief. Reports cough, ear pain, chest tightness and congestion. Had outpatient bladder surgery and has been coughing up blood since then. Sinus surgery summer 2014. Now 5 days of increased head congestion. New hemoptysis, daily, since bladder surgery 5 days ago during which she was intubated. Now on cephalexin for UTI. Notices frontal headache, ears full. Treating leg cramps with bananas and mustard a1AT level 02/07/13- WNL  MM 167. CXR 01/30/13 IMPRESSION:  Emphysema without acute cardiopulmonary disease.  Electronically Signed  By: Andreas Newport M.D.  On: 01/30/2013 13:00  ROS-see HPI Constitutional:   No-   weight loss, night sweats, fevers, chills, fatigue, lassitude. HEENT:   + headaches, no-difficulty swallowing, tooth/dental problems, sore throat,       No-  sneezing, itching, ear ache, +nasal congestion, post nasal drip,  CV:  No-   chest pain, orthopnea, PND, swelling in lower extremities, anasarca, dizziness, palpitations Resp: No-   shortness of breath with exertion or at rest.              No-   productive cough,  + non-productive cough,  + coughing up of blood.              No-   change in color of mucus.  No- wheezing.   Skin: No-   rash or lesions. GI:  +   heartburn, indigestion, + loss of appetite GU: . MS:  No-   joint pain or swelling.   Neuro-     nothing unusual Psych:  No- change in mood or affect. No depression or anxiety.  No memory loss.  OBJ-  Physical Exam General- Alert, Oriented, Affect-appropriate, Distress- none acute, Trim Skin- rash-none, lesions- none, excoriation- none Lymphadenopathy- none Head- atraumatic            Eyes- Gross vision intact, PERRLA, conjunctivae and secretions clear            Ears- Hearing, canals-normal            Nose- Clear, no-Septal dev, mucus, polyps, erosion, perforation             Throat- Mallampati II , mucosa clear- not red , drainage- none, tonsils- atrophic Neck-  flexible , trachea midline, no stridor , thyroid nl, carotid no bruit Chest - symmetrical excursion , unlabored           Heart/CV- RRR , no murmur , no gallop  , no rub, nl s1 s2                           - JVD- none , edema- none, stasis changes- none, varices- none           Lung- clear to P&A, wheeze- none, cough+ with deep breath , dullness-none, rub- none. Long/thin thorax.           Chest wall-  Abd-  Br/ Gen/ Rectal- Not done, not indicated Extrem- cyanosis- none, clubbing, none, atrophy- none, strength- nl Neuro- grossly intact to observation

## 2013-03-15 NOTE — Telephone Encounter (Signed)
noted 

## 2013-03-19 NOTE — Telephone Encounter (Signed)
Saw pulmonologist 03/15/13 and was given a depomedrol injection and tessalon peerles. She is taking phenylephrine and had acupuncture today.  She continues to have chest congestion, cough, and facial pain/pressure. She was diagnosed with a viral infection. Suggested she switch to pseudoephedrine and check with the pharmacist about drug interactions.  She will follow-up with [name] tomorrow morning if symptoms worsen.

## 2013-03-22 ENCOUNTER — Telehealth: Payer: Self-pay | Admitting: Internal Medicine

## 2013-03-22 DIAGNOSIS — R0600 Dyspnea, unspecified: Secondary | ICD-10-CM

## 2013-03-22 DIAGNOSIS — R042 Hemoptysis: Secondary | ICD-10-CM

## 2013-03-22 NOTE — Telephone Encounter (Signed)
With persistent cough and coughing up blood, no obvious infection, no acute change in last couple of days- want to look for different problems like blood clots. Plan- order- BMET for lab,                      CT chest with contrast dx hemoptysis, dyspnea, PE protocol

## 2013-03-22 NOTE — Telephone Encounter (Signed)
Called and spoke with pt and she stated that she was seen at urgent care in [facility] on 12/3 and given pred taper x 6 days.   She was seen by CY on 12/19 and was given depo 80 mg at this office visit.  Pt is still c/o  Dry cough Sinus pressure  Headache Ear pressure  She stated that she has lots of allergies to abx and she is not sure if she can take any more prednisone.  CY please advise. Thanks  Last ov 12/19 Next ov--04/03/2013   Allergies  Allergen Reactions  . Avelox [Moxifloxacin Hcl In Nacl] Other (See Comments)    Tremors   . Biaxin [Clarithromycin]     Unsure of reaction  . Cedax [Ceftibuten] Other (See Comments)    tremors  . Clindamycin/Lincomycin     tachycardia  . Levofloxacin Other (See Comments)    Feels dehydrated  . Singulair [Montelukast Sodium] Other (See Comments)    Numbness and tingling in hand.  . Sulfonamide Derivatives Other (See Comments)    Gi upset     Current Outpatient Prescriptions on File Prior to Visit  Medication Sig Dispense Refill  . Aclidinium Bromide (TUDORZA PRESSAIR IN) Inhale into the lungs.      [name] albuterol (PROVENTIL HFA;VENTOLIN HFA) 108 (90 BASE) MCG/ACT inhaler Inhale 2 puffs into the lungs every 6 (six) hours as needed for wheezing.      [name] ALPRAZolam (XANAX) 0.5 MG tablet Take 1 tablet (0.5 mg total) by mouth 3 (three) times daily as needed for anxiety.  50 tablet  0  . beclomethasone (QVAR) 40 MCG/ACT inhaler Inhale 2 puffs into the lungs 2 (two) times daily.      . benzonatate (TESSALON) 100 MG capsule Take 1-2 capsules (100-200 mg total) by mouth 3 (three) times daily as needed for cough.  40 capsule  0  . Calcium Glycerophosphate (PRELIEF) 340 (65-50) MG (CA-P) TABS Take 1 capsule by mouth 3 (three) times daily before meals.  90 each  11  . Calcium-Magnesium-Vitamin D (CITRACAL CALCIUM+D PO) Take 1 tablet by mouth 2 (two) times daily.       . cephALEXin (KEFLEX) 500 MG capsule Take 500 mg by mouth 3 (three) times daily.      [name]  denosumab (PROLIA) 60 MG/ML SOLN injection Inject 60 mg into the skin every 6 (six) months. Administer in upper arm, thigh, or abdomen      . lidocaine (XYLOCAINE) 2 % jelly Apply topically as needed.      . pantoprazole (PROTONIX) 40 MG tablet Take 40 mg by mouth daily.      . pentosan polysulfate (ELMIRON) 100 MG capsule Take 1 capsule (100 mg total) by mouth 3 (three) times daily before meals.  90 capsule  6  . phenazopyridine (PYRIDIUM) 95 MG tablet Take 95 mg by mouth 3 (three) times daily as needed for pain.      . polyethylene glycol (MIRALAX / GLYCOLAX) packet May use 2 tablespoons in juice or water daily; or 1 packet.  14 each  0  . Probiotic Product (ALIGN) 4 MG CAPS Take 1 capsule by mouth daily.      . Quercetin (QUERCITIN) POWD by Does not apply route.       Current Facility-Administered Medications on File Prior to Visit  Medication Dose Route Frequency Provider Last Rate Last Dose  . bupivacaine (MARCAINE) 0.5 % 10 mL, triamcinolone acetonide (KENALOG-40) 40 mg injection   Subcutaneous Once Kathi Ludwig, MD      .  bupivacaine (MARCAINE) 0.5 % 15 mL, phenazopyridine (PYRIDIUM) 400 mg bladder mixture   Bladder Instillation Once Kathi Ludwig, MD

## 2013-03-23 DIAGNOSIS — R252 Cramp and spasm: Secondary | ICD-10-CM | POA: Insufficient documentation

## 2013-03-23 DIAGNOSIS — J440 Chronic obstructive pulmonary disease with acute lower respiratory infection: Secondary | ICD-10-CM | POA: Insufficient documentation

## 2013-03-23 DIAGNOSIS — R042 Hemoptysis: Secondary | ICD-10-CM | POA: Insufficient documentation

## 2013-03-23 NOTE — Assessment & Plan Note (Signed)
Nonspecific cramps recently. Plan- Slow Mag for K and Mg, refill Xanax

## 2013-03-23 NOTE — Assessment & Plan Note (Signed)
Plan-finished Keflex

## 2013-03-23 NOTE — Assessment & Plan Note (Addendum)
Recent streak hemoptysis associated with sinusitis/bronchitis in a never smoker.Suspect airway trauma when she was intubated for bladder surgery Plan-continue Keflex prescribed for UTI but probably also helpful for respiratory infection. Watch bleeding

## 2013-03-25 ENCOUNTER — Ambulatory Visit (INDEPENDENT_AMBULATORY_CARE_PROVIDER_SITE_OTHER)
Admission: RE | Admit: 2013-03-25 | Discharge: 2013-03-25 | Disposition: A | Discharge status: Home / Self Care | Payer: Medicare Other | Source: Ambulatory Visit | Attending: Internal Medicine | Admitting: Internal Medicine

## 2013-03-25 ENCOUNTER — Telehealth: Payer: Self-pay | Admitting: Internal Medicine

## 2013-03-25 ENCOUNTER — Other Ambulatory Visit (INDEPENDENT_AMBULATORY_CARE_PROVIDER_SITE_OTHER): Payer: Medicare Other

## 2013-03-25 DIAGNOSIS — R0600 Dyspnea, unspecified: Secondary | ICD-10-CM

## 2013-03-25 DIAGNOSIS — R0609 Other forms of dyspnea: Secondary | ICD-10-CM

## 2013-03-25 DIAGNOSIS — R042 Hemoptysis: Secondary | ICD-10-CM

## 2013-03-25 LAB — BASIC METABOLIC PANEL
BUN: 11 mg/dL (ref 6–23)
Calcium: 9.3 mg/dL (ref 8.4–10.5)
Creatinine, Ser: 0.7 mg/dL (ref 0.4–1.2)
GFR: 93.08 mL/min (ref >60.00)
Glucose, Bld: 95 mg/dL (ref 70–99)

## 2013-03-25 MED ORDER — IOHEXOL 350 MG/ML SOLN
100.0000 mL | Freq: Once | INTRAVENOUS | Status: AC | PRN
  Administered 2013-03-25: 80 mL via INTRAVENOUS

## 2013-03-25 NOTE — Telephone Encounter (Signed)
I spoke with patient on 03-22-13 about this matter and she is aware to come by the office to have labs drawn prior to the CT chest with contrast and to await a call from our PCC's to get date and time of CT Chest.   Orders placed and nothing more needed at this time.

## 2013-03-25 NOTE — Progress Notes (Signed)
Quick Note:  LMTCB ______ 

## 2013-03-25 NOTE — Addendum Note (Signed)
Addended by: Ronny Bacon on: 03/25/2013 08:43 AM   Modules accepted: Orders

## 2013-03-25 NOTE — Progress Notes (Signed)
Quick Note:  Results noted for CT scan today. ______

## 2013-03-25 NOTE — Telephone Encounter (Signed)
Notes Recorded by Waymon Budge, MD on 03/25/2013 at 2:10 PM CT shows the know emphysema, but no blood clot or other active concern  I spoke with patient about results and she verbalized understanding and had no questions

## 2013-03-29 ENCOUNTER — Telehealth: Payer: Self-pay | Admitting: Internal Medicine

## 2013-03-29 ENCOUNTER — Ambulatory Visit (INDEPENDENT_AMBULATORY_CARE_PROVIDER_SITE_OTHER): Payer: Medicare Other | Admitting: Internal Medicine

## 2013-03-29 ENCOUNTER — Encounter: Payer: Self-pay | Admitting: Internal Medicine

## 2013-03-29 VITALS — BP 96/68 | HR 81 | Ht 69.0 in | Wt 112.0 lb

## 2013-03-29 DIAGNOSIS — J45909 Unspecified asthma, uncomplicated: Secondary | ICD-10-CM

## 2013-03-29 DIAGNOSIS — R042 Hemoptysis: Secondary | ICD-10-CM

## 2013-03-29 DIAGNOSIS — R059 Cough, unspecified: Secondary | ICD-10-CM

## 2013-03-29 DIAGNOSIS — J329 Chronic sinusitis, unspecified: Secondary | ICD-10-CM

## 2013-03-29 DIAGNOSIS — R05 Cough: Secondary | ICD-10-CM

## 2013-03-29 DIAGNOSIS — J440 Chronic obstructive pulmonary disease with acute lower respiratory infection: Secondary | ICD-10-CM

## 2013-03-29 DIAGNOSIS — B370 Candidal stomatitis: Secondary | ICD-10-CM

## 2013-03-29 MED ORDER — FLUCONAZOLE 150 MG PO TABS: ORAL_TABLET | ORAL | Status: DC

## 2013-03-29 NOTE — Telephone Encounter (Signed)
Pt is going to keep her appt today with CY at 2:30.

## 2013-03-29 NOTE — Patient Instructions (Signed)
Script sent for diflucan to clear yeast (thrush) from your throat  Try to minimize throat clearing and rasping which tend to irritate the lining of your throat. Sips of liquids, throat lozenges, otc coating cough syrups like Delsym may help  Neb neo nasal Ok to use saline nasal rinse as you find helpful  Keep the appointment with your ENT doctor

## 2013-03-29 NOTE — Telephone Encounter (Signed)
Pt called back, says she's going to keep her appt today nothing further needed.Kristy Garza

## 2013-03-29 NOTE — Progress Notes (Signed)
12/28/12- 8 yoF never smoker-Dr Wong/ [facility]-last seen by [place] 2009; seen at [facility] chest 2002; chronic cough  Has seen Dr. Lamonte Sakai here and Dr Neldon Mc in the past. Complains of allergies, asthma, chronic cough for over 20 years. Help with inhalers and cough medicines. Using rescue inhaler once or twice a day. Not using Qvar now. Particularly bad in spring and fall. [place] smells musty when it rains. She thinks in retrospect that allergy shots in the past with more than she realized. She had one episode while on allergy shots, when she had throat tightness which scared her so she quit.. Occasional cough at night. Previous PFTs are filed, but as recently as 2009 showed reversible obstructive airways disease and small airways. Singulair blamed for paresthesias. Son smokes outside the home. History of sinus surgery, vocal cord polyp. Father had allergies and asthma  02/07/13- 62 yoF never smoker-Dr Wong/ [facility]-last seen by [place] 2009; seen at [facility] chest 2002; followed for chronic cough, allergic rhinitis  ACUTE VISIT:  Cough worsening over the past month.  Cough dry-persistant  cough worse in the past week. Losing weight attributed to poor appetite which she blames on acid indigestion despite pantoprazole/ elevated HOB. She's worried the chest x-ray suggests emphysema. Anoro blamed for chest pain. CXR 01/30/13 CLINICAL DATA: Cough.  EXAM:  CHEST 2 VIEW  COMPARISON: Chest radiograph 05/15/2012.  FINDINGS:  The cardiopericardial silhouette and mediastinal contours are within  normal limits. Hyperinflation and severe emphysema is present. No  airspace disease or pleural effusion is identified.  IMPRESSION:  Emphysema without acute cardiopulmonary disease.  Electronically Signed  By: Dereck Ligas M.D.  On: 01/30/2013 13:00  03/15/13- 62 yoF never smoker-Dr Wong/ [facility]-last seen by [place] 2009; seen at [facility] chest 2002; followed for chronic cough, allergic rhinitis  ACUTE VISIT: Cough since 01/2013.  Saw her PCP and was given prednisone with no relief. Reports cough, ear pain, chest tightness and congestion. Had outpatient bladder surgery and has been coughing up blood since then. Sinus surgery summer 2014. Now 5 days of increased head congestion. New hemoptysis, daily, since bladder surgery 5 days ago during which she was intubated. Now on cephalexin for UTI. Notices frontal headache, ears full. Treating leg cramps with bananas and mustard a1AT level 02/07/13- WNL  MM 167. CXR 01/30/13 IMPRESSION:  Emphysema without acute cardiopulmonary disease.  Electronically Signed  By: Dereck Ligas M.D.  On: 01/30/2013 13:00  03/29/13- 62 yoF never smoker (father smoked)- followed for chronic cough, allergic rhinitis , COPD by CT Pt c/o sinus pressure, headaches, ear pain, prod cough with sometimes clear mucous  She had prednisone in early Dec for cough, then reported streak hemoptysis and had CT. Has finished Keflex and prednisone with no more hemoptysis. Admits head and sinus pressure. Taking Sudafed. Has followup appointment with ENT in [place] profile 12/28/2012 negative with total IgE 4.5. a1AT 02/07/13- WNL MM, 167 CT 03/25/13 IMPRESSION:  1. No CT evidence of acute pulmonary embolism.  2. Emphysema. No acute cardiopulmonary process identified.  Electronically Signed  By: Jeannine Boga M.D.  On: 03/25/2013 13:31  ROS-see HPI Constitutional:   No-   weight loss, night sweats, fevers, chills, fatigue, lassitude. HEENT:   + headaches, no-difficulty swallowing, tooth/dental problems, sore throat,       No-  sneezing, itching, ear ache, +nasal congestion, post nasal drip,  CV:  No-   chest pain, orthopnea, PND, swelling in lower extremities, anasarca, dizziness, palpitations Resp: No-   shortness of breath  with exertion or at rest.              No-   productive cough,  + non-productive cough,  + coughing up of blood.              No-   change in color of mucus.  No-  wheezing.   Skin: No-   rash or lesions. GI:  +   heartburn, indigestion, + loss of appetite GU: . MS:  No-   joint pain or swelling.   Neuro-     nothing unusual Psych:  No- change in mood or affect. No depression or anxiety.  No memory loss.  OBJ- Physical Exam General- Alert, Oriented, Affect-appropriate, Distress- none acute, Trim Skin- rash-none, lesions- none, excoriation- none Lymphadenopathy- none Head- atraumatic            Eyes- Gross vision intact, PERRLA, conjunctivae and secretions clear            Ears- Hearing, canals-normal            Nose- Clear, no-Septal dev, mucus, polyps, erosion, perforation             Throat- Mallampati II , mucosa+thrush , drainage- none, tonsils- atrophic.  Throat                     clearing Neck- flexible , trachea midline, no stridor , thyroid nl, carotid no bruit Chest - symmetrical excursion , unlabored           Heart/CV- RRR , no murmur , no gallop  , no rub, nl s1 s2                           - JVD- none , edema- none, stasis changes- none, varices- none           Lung- clear to P&A, wheeze- none, cough+ with deep breath , dullness-none, rub- none. Long/thin thorax.           Chest wall-  Abd-  Br/ Gen/ Rectal- Not done, not indicated Extrem- cyanosis- none, clubbing, none, atrophy- none, strength- nl Neuro- grossly intact to observation

## 2013-04-01 ENCOUNTER — Telehealth: Payer: Self-pay | Admitting: Internal Medicine

## 2013-04-01 NOTE — Telephone Encounter (Signed)
Spoke with pt. FYI for Dr. Annamaria Boots. She cancelled her allergy testing for 04/03/13 and PFT on 04/02/13. 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 Also pt reports she does not even feel up to do any type of test right now and will call back when ready. Will forward to Dr. Annamaria Boots as Juluis Rainier

## 2013-04-01 NOTE — Telephone Encounter (Signed)
Noted  

## 2013-04-02 ENCOUNTER — Telehealth: Payer: Self-pay | Admitting: Internal Medicine

## 2013-04-02 NOTE — Telephone Encounter (Signed)
Spoke with patient- states that she was given Diflucan for 5 days on 03-29-13 and has 1 tablet left-she is not better from yeast; would like to have something else for her thrush. CY Please advise. Thanks.

## 2013-04-03 ENCOUNTER — Encounter: Payer: Self-pay | Admitting: General Practice

## 2013-04-03 ENCOUNTER — Ambulatory Visit (INDEPENDENT_AMBULATORY_CARE_PROVIDER_SITE_OTHER): Payer: Medicare Other | Admitting: General Practice

## 2013-04-03 ENCOUNTER — Ambulatory Visit: Payer: Medicare Other | Admitting: Internal Medicine

## 2013-04-03 VITALS — BP 121/75 | HR 93 | Temp 96.9°F | Ht 69.0 in | Wt 109.0 lb

## 2013-04-03 DIAGNOSIS — R059 Cough, unspecified: Secondary | ICD-10-CM

## 2013-04-03 DIAGNOSIS — J329 Chronic sinusitis, unspecified: Secondary | ICD-10-CM

## 2013-04-03 DIAGNOSIS — R05 Cough: Secondary | ICD-10-CM

## 2013-04-03 MED ORDER — ALBUTEROL SULFATE (2.5 MG/3ML) 0.083% IN NEBU
2.5000 mg | INHALATION_SOLUTION | Freq: Once | RESPIRATORY_TRACT | Status: AC
Start: 2013-04-03 — End: 2013-04-03
  Administered 2013-04-03: 2.5 mg via RESPIRATORY_TRACT

## 2013-04-03 MED ORDER — NYSTATIN 100000 UNIT/ML MT SUSP: 5.0000 mL | Freq: Four times a day (QID) | OROMUCOSAL | Status: DC

## 2013-04-03 NOTE — Progress Notes (Signed)
   Subjective:    Patient ID: Kristy Garza, female    DOB: 1950-07-18, 63 y.o.   MRN: [id number]  Sinusitis This is a chronic problem. The current episode started more than 1 year ago. The problem is unchanged. Associated symptoms include coughing and sinus pressure. Pertinent negatives include no chills or sore throat. Past treatments include oral decongestants and spray decongestants. The treatment provided mild relief.  Reports being by pulmonologist on 03/15/13 and received pred pack/deop medrol/CT scan. She reports having upcoming appointment with ENT on 04/10/13. Reports having 2 sinus surgeries in the past.     Review of Systems  Constitutional: Negative for fever and chills.  HENT: Positive for sinus pressure. Negative for sore throat.   Respiratory: Positive for cough. Negative for chest tightness and wheezing.   Cardiovascular: Negative for chest pain and palpitations.  All other systems reviewed and are negative.       Objective:   Physical Exam  Constitutional: She is oriented to person, place, and time. She appears well-developed and well-nourished.  HENT:  Head: Normocephalic and atraumatic.  Right Ear: External ear normal.  Left Ear: External ear normal.  Nose: Right sinus exhibits maxillary sinus tenderness and frontal sinus tenderness. Left sinus exhibits maxillary sinus tenderness and frontal sinus tenderness.  Mouth/Throat: No posterior oropharyngeal erythema.  Cardiovascular: Normal rate, regular rhythm and normal heart sounds.   Pulmonary/Chest: Effort normal. No respiratory distress. She exhibits no tenderness.  Decreased breath sounds throughout  Neurological: She is alert and oriented to person, place, and time.  Skin: Skin is warm and dry.  Psychiatric: She has a normal mood and affect.          Assessment & Plan:  1. Cough  - albuterol (PROVENTIL) (2.5 MG/3ML) 0.083% nebulizer solution 2.5 mg; Take 3 mLs (2.5 mg total) by nebulization  once.  2. Sinusitis, chronic -Continue medications as prescribed by ENT and pulmonologist -maintain scheduled appointments with specialist -Patient verbalized understanding Erby Pian, FNP-C

## 2013-04-03 NOTE — Patient Instructions (Signed)

## 2013-04-03 NOTE — Telephone Encounter (Signed)
Spoke with pt. She saw PCP this AM and was giving breathing TX. She is wanting to know if she can take OTC Dezentrex. It's an inhaler per pt and can use every 2 hrs and can not use it more than 3 days. Per pt this is suppose to help with her breathing. Please advise Dr. Annamaria Boots thanks  Allergies  Allergen Reactions  . Avelox [Moxifloxacin Hcl In Nacl] Other (See Comments)    Tremors   . Biaxin [Clarithromycin]     Unsure of reaction  . Cedax [Ceftibuten] Other (See Comments)    tremors  . Clindamycin/Lincomycin     tachycardia  . Levofloxacin Other (See Comments)    Feels dehydrated  . Singulair [Montelukast Sodium] Other (See Comments)    Numbness and tingling in hand.  . Sulfonamide Derivatives Other (See Comments)    Gi upset

## 2013-04-03 NOTE — Telephone Encounter (Signed)
This is a nasal decongestant, not through the mouth. Like Afrin and the other inhaled nasal decongestants, it can cause rebound and dependency if used more than a few days at a time. We discussed that w/ the pt.

## 2013-04-03 NOTE — Telephone Encounter (Signed)
Returning call.

## 2013-04-03 NOTE — Telephone Encounter (Signed)
Per pt this is an inhaler that you inhale through the mouth. Per pt this is suppose to help with her breathing. thanks

## 2013-04-03 NOTE — Telephone Encounter (Signed)
Offer nystatin 100,000 units, disp 150 ml,      Swish and swallow 5 ml, 4 times daily

## 2013-04-03 NOTE — Telephone Encounter (Signed)
Per CY - we need to contact the pt and find out where she got this inhaler, he's not familiar with it.  Per the pt it is not an inhaler you inhale through your mouth, it's inhaled through your nose. Advised the pt that she can use this if it's helpful but not to use it more than recommended due to dependence.

## 2013-04-03 NOTE — Telephone Encounter (Signed)
lmomtcb x1 for pt 

## 2013-04-03 NOTE — Telephone Encounter (Signed)
Not familiar with this. Is it a nasal decongestant?

## 2013-04-06 ENCOUNTER — Other Ambulatory Visit: Payer: Self-pay | Admitting: *Deleted

## 2013-04-06 DIAGNOSIS — R062 Wheezing: Secondary | ICD-10-CM

## 2013-04-06 MED ORDER — ALBUTEROL SULFATE (2.5 MG/3ML) 0.083% IN NEBU: 2.5000 mg | INHALATION_SOLUTION | Freq: Four times a day (QID) | RESPIRATORY_TRACT | Status: DC | PRN

## 2013-04-08 ENCOUNTER — Telehealth: Payer: Self-pay | Admitting: General Practice

## 2013-04-08 NOTE — Telephone Encounter (Signed)
Patient AWARE

## 2013-04-08 NOTE — Telephone Encounter (Signed)
Please inform patient to come by office to pick up starter tubing. Will need to get additional tubing from medical supply.

## 2013-04-11 DIAGNOSIS — R479 Unspecified speech disturbances: Secondary | ICD-10-CM | POA: Insufficient documentation

## 2013-04-11 DIAGNOSIS — Q310 Web of larynx: Secondary | ICD-10-CM | POA: Insufficient documentation

## 2013-04-12 ENCOUNTER — Encounter: Payer: Self-pay | Admitting: Family Medicine

## 2013-04-12 ENCOUNTER — Ambulatory Visit (INDEPENDENT_AMBULATORY_CARE_PROVIDER_SITE_OTHER): Payer: Medicare Other | Admitting: Family Medicine

## 2013-04-12 VITALS — BP 111/70 | HR 81 | Temp 97.9°F | Ht 69.0 in | Wt 110.6 lb

## 2013-04-12 DIAGNOSIS — J069 Acute upper respiratory infection, unspecified: Secondary | ICD-10-CM

## 2013-04-12 NOTE — Patient Instructions (Signed)

## 2013-04-12 NOTE — Progress Notes (Signed)
   Subjective:    Patient ID: Kristy Garza, female    DOB: [birth year], 63 y.o.   MRN: [id number]  HPI This 63 y.o. female presents for evaluation of URI sx's and fatigue.  She is on doxycycline 100mg  po bid. She is still having some cough and congestion.   Review of Systems C/o congestion and uri sx's. No chest pain, SOB, HA, dizziness, vision change, N/V, diarrhea, constipation, dysuria, urinary urgency or frequency, myalgias, arthralgias or rash.      Objective:   Physical Exam  Vital signs noted  Well developed well nourished female.  HEENT - Head atraumatic Normocephalic                Eyes - PERRLA, Conjuctiva - clear Sclera- Clear EOMI                Ears - EAC's Wnl TM's Wnl Gross Hearing WNL                Nose - Nares patent                 Throat - oropharanx wnl Respiratory - Lungs CTA bilateral Cardiac - RRR S1 and S2 without murmur.     Assessment & Plan:  URI (upper respiratory infection) Continue doxycycline and advised to push po fluids, rest, and to use mucinex otc. Recommend she use flonase NS  Lysbeth Penner FNP

## 2013-04-15 ENCOUNTER — Telehealth: Payer: Self-pay | Admitting: Internal Medicine

## 2013-04-15 ENCOUNTER — Telehealth: Payer: Self-pay | Admitting: General Practice

## 2013-04-15 NOTE — Telephone Encounter (Signed)
Suggest wither Mucinex- D at pharmacy counter, or Sudafed-PE off the shelf

## 2013-04-15 NOTE — Telephone Encounter (Signed)
Wants to know if she can stop the antibiotic and the prednisone ? Wants to know what can she do to relive the infection? Nothing is helping her she has had this problem since 02/27/13

## 2013-04-15 NOTE — Telephone Encounter (Signed)
She had visit with ENT last week who referred her to another specialist.  They think it may be scar tissue left in her throat and this could cause the cough.  She will continue with the prednisone and antibiotic, although she has been told it could be viral and not help.

## 2013-04-15 NOTE — Telephone Encounter (Signed)
Pt is aware of CY recs. Nothing further was needed. 

## 2013-04-15 NOTE — Telephone Encounter (Signed)
Spoke with pt. Reports severe sinus congestion and coughing with production of thick, clear mucus. Saw ENT about her sinuses and was told that her sinuses look health. ENT did prescribe her an antibiotic and this was finished last week with no relief. PCP advised that she could take prednisone that she already had at home 20mg  x3 days. Wants to know how CY would like her to proceed.  Allergies  Allergen Reactions  . Avelox [Moxifloxacin Hcl In Nacl] Other (See Comments)    Tremors   . Biaxin [Clarithromycin]     Unsure of reaction  . Cedax [Ceftibuten] Other (See Comments)    tremors  . Clindamycin/Lincomycin     tachycardia  . Levofloxacin Other (See Comments)    Feels dehydrated  . Singulair [Montelukast Sodium] Other (See Comments)    Numbness and tingling in hand.  . Sulfonamide Derivatives Other (See Comments)    Gi upset    Current Outpatient Prescriptions on File Prior to Visit  Medication Sig Dispense Refill  . albuterol (PROVENTIL) (2.5 MG/3ML) 0.083% nebulizer solution Take 3 mLs (2.5 mg total) by nebulization every 6 (six) hours as needed for wheezing or shortness of breath.  150 mL  1  . ALPRAZolam (XANAX) 0.5 MG tablet Take 1 tablet (0.5 mg total) by mouth 3 (three) times daily as needed for anxiety.  50 tablet  0  . beclomethasone (QVAR) 40 MCG/ACT inhaler Inhale 2 puffs into the lungs 2 (two) times daily.      . benzonatate (TESSALON) 100 MG capsule Take 1-2 capsules (100-200 mg total) by mouth 3 (three) times daily as needed for cough.  40 capsule  0  . Calcium Glycerophosphate (PRELIEF) 340 (65-50) MG (CA-P) TABS Take 1 capsule by mouth 3 (three) times daily before meals.  90 each  11  . Calcium-Magnesium-Vitamin D (CITRACAL CALCIUM+D PO) Take 1 tablet by mouth 2 (two) times daily.       [name] denosumab (PROLIA) 60 MG/ML SOLN injection Inject 60 mg into the skin every 6 (six) months. Administer in upper arm, thigh, or abdomen      . lidocaine (XYLOCAINE) 2 % jelly  Apply topically as needed.      . nystatin (MYCOSTATIN) 100000 UNIT/ML suspension Take 5 mLs (500,000 Units total) by mouth 4 (four) times daily. Swish and swallow  150 mL  0  . pantoprazole (PROTONIX) 40 MG tablet Take 40 mg by mouth daily.      . pentosan polysulfate (ELMIRON) 100 MG capsule Take 1 capsule (100 mg total) by mouth 3 (three) times daily before meals.  90 capsule  6  . Probiotic Product (ALIGN) 4 MG CAPS Take 1 capsule by mouth daily.      . Quercetin (QUERCITIN) POWD by Does not apply route.       Current Facility-Administered Medications on File Prior to Visit  Medication Dose Route Frequency Provider Last Rate Last Dose  . bupivacaine (MARCAINE) 0.5 % 10 mL, triamcinolone acetonide (KENALOG-40) 40 mg injection   Subcutaneous Once Ailene Rud, MD      . bupivacaine (MARCAINE) 0.5 % 15 mL, phenazopyridine (PYRIDIUM) 400 mg bladder mixture   Bladder Instillation Once Ailene Rud, MD        CY - please advise. Thanks.

## 2013-04-21 DIAGNOSIS — B370 Candidal stomatitis: Secondary | ICD-10-CM | POA: Insufficient documentation

## 2013-04-21 NOTE — Assessment & Plan Note (Signed)
Benign hemorrhagic bronchitis clinically. She is to report any recurrence.

## 2013-04-21 NOTE — Assessment & Plan Note (Signed)
Plan-nasal nebulizer decongestant, saline rinse, sample Dymista nasal spray if available. Keep followup appointment with ENT as planned

## 2013-04-21 NOTE — Assessment & Plan Note (Signed)
Plan-Diflucan

## 2013-04-21 NOTE — Assessment & Plan Note (Signed)
Recent transient bronchitis with benign hemorrhagic bronchitis. Discussed followup imaging.

## 2013-04-23 ENCOUNTER — Telehealth: Payer: Self-pay | Admitting: Pharmacist

## 2013-04-23 NOTE — Telephone Encounter (Signed)
Left message for patient regarding coverage of Prolia by her insurance.  Cost to patient would be 20% or about $170. Instructed patient to let me know if she want to get Prolia and will set up appt.

## 2013-04-24 ENCOUNTER — Telehealth: Payer: Self-pay | Admitting: Pharmacist

## 2013-04-24 NOTE — Telephone Encounter (Signed)
Patient to come in 05/02/13 for prolia injection 20% cost to patient

## 2013-04-25 ENCOUNTER — Telehealth: Payer: Self-pay | Admitting: Internal Medicine

## 2013-04-25 MED ORDER — DOXYCYCLINE HYCLATE 100 MG PO TABS: ORAL_TABLET | ORAL | Status: DC

## 2013-04-25 NOTE — Telephone Encounter (Signed)
I called and spoke with pt made aware of recs. Nothing further needed

## 2013-04-25 NOTE — Telephone Encounter (Signed)
Offer doxycycline 100 mg, # 8, 2 today then one daily 

## 2013-04-25 NOTE — Telephone Encounter (Signed)
Called and spoke with pt. She c/o prod cough w/ thick clear phlem, sinus pressure, HA, teeth/gum pain, clearing throat frequently. She has been taking tessalon pearls, tylenol, flonase, proair, using nystatin for yeast. Pt thinks she needs an abx. Please advise Dr. Annamaria Boots thanks Last OV 03/29/13  Allergies  Allergen Reactions  . Avelox [Moxifloxacin Hcl In Nacl] Other (See Comments)    Tremors   . Biaxin [Clarithromycin]     Unsure of reaction  . Cedax [Ceftibuten] Other (See Comments)    tremors  . Clindamycin/Lincomycin     tachycardia  . Levofloxacin Other (See Comments)    Feels dehydrated  . Singulair [Montelukast Sodium] Other (See Comments)    Numbness and tingling in hand.  . Sulfonamide Derivatives Other (See Comments)    Gi upset    Current Outpatient Prescriptions on File Prior to Visit  Medication Sig Dispense Refill  . albuterol (PROVENTIL) (2.5 MG/3ML) 0.083% nebulizer solution Take 3 mLs (2.5 mg total) by nebulization every 6 (six) hours as needed for wheezing or shortness of breath.  150 mL  1  . ALPRAZolam (XANAX) 0.5 MG tablet Take 1 tablet (0.5 mg total) by mouth 3 (three) times daily as needed for anxiety.  50 tablet  0  . beclomethasone (QVAR) 40 MCG/ACT inhaler Inhale 2 puffs into the lungs 2 (two) times daily.      . benzonatate (TESSALON) 100 MG capsule Take 1-2 capsules (100-200 mg total) by mouth 3 (three) times daily as needed for cough.  40 capsule  0  . Calcium Glycerophosphate (PRELIEF) 340 (65-50) MG (CA-P) TABS Take 1 capsule by mouth 3 (three) times daily before meals.  90 each  11  . Calcium-Magnesium-Vitamin D (CITRACAL CALCIUM+D PO) Take 1 tablet by mouth 2 (two) times daily.       [name] denosumab (PROLIA) 60 MG/ML SOLN injection Inject 60 mg into the skin every 6 (six) months. Administer in upper arm, thigh, or abdomen      . lidocaine (XYLOCAINE) 2 % jelly Apply topically as needed.      . nystatin (MYCOSTATIN) 100000 UNIT/ML suspension Take 5 mLs  (500,000 Units total) by mouth 4 (four) times daily. Swish and swallow  150 mL  0  . pantoprazole (PROTONIX) 40 MG tablet Take 40 mg by mouth daily.      . pentosan polysulfate (ELMIRON) 100 MG capsule Take 1 capsule (100 mg total) by mouth 3 (three) times daily before meals.  90 capsule  6  . Probiotic Product (ALIGN) 4 MG CAPS Take 1 capsule by mouth daily.      . Quercetin (QUERCITIN) POWD by Does not apply route.       Current Facility-Administered Medications on File Prior to Visit  Medication Dose Route Frequency Provider Last Rate Last Dose  . bupivacaine (MARCAINE) 0.5 % 10 mL, triamcinolone acetonide (KENALOG-40) 40 mg injection   Subcutaneous Once Ailene Rud, MD      . bupivacaine (MARCAINE) 0.5 % 15 mL, phenazopyridine (PYRIDIUM) 400 mg bladder mixture   Bladder Instillation Once Ailene Rud, MD

## 2013-04-26 ENCOUNTER — Telehealth: Payer: Self-pay | Admitting: Internal Medicine

## 2013-04-26 NOTE — Telephone Encounter (Signed)
Kristy Garza- please contact her about being scheduled for allergy skin tests.

## 2013-04-26 NOTE — Telephone Encounter (Signed)
Called and spoke with pt. appt scheduled. Aware of recs. Nothing further needed

## 2013-04-26 NOTE — Telephone Encounter (Signed)
Per CY-okay to schedule patient for skin testing; we have openings on Wednesday 05-01-13-if unable to do that afternoon will need to schedule in March. Pt will need to be off antihistamines, OTC cough syrups, OTC sleep aids 3 days prior to testing date. Thanks.

## 2013-04-26 NOTE — Telephone Encounter (Signed)
Last OV 03/29/13 I spoke with the pt and she states she is still having a strong cough and states she has some hycodan cough syrup left from previous rx and wanted to know if this was ok to take. I advsied that this is fine to take. I advised that it can cause drowsiness so to be aware when she takes it, and to not drive. Pt states understanding. Pt also states that CY mentioned her being evaluated for allergies and she wants to schedule this. Dr. Annamaria Boots would this need to be a 30 minute allergy consult slot? Please advise and I will schedule the pt. Thanks. [place], CMA

## 2013-04-29 ENCOUNTER — Telehealth: Payer: Self-pay | Admitting: Internal Medicine

## 2013-04-29 NOTE — Telephone Encounter (Signed)
Per CY-okay to take Sudafed but skip the day of the skin test. Thanks.

## 2013-04-29 NOTE — Telephone Encounter (Signed)
Pt aware.

## 2013-04-29 NOTE — Telephone Encounter (Signed)
Please advise Dr. Young thanks 

## 2013-05-01 ENCOUNTER — Telehealth: Payer: Self-pay | Admitting: Internal Medicine

## 2013-05-01 ENCOUNTER — Encounter: Payer: Self-pay | Admitting: Internal Medicine

## 2013-05-01 ENCOUNTER — Ambulatory Visit (INDEPENDENT_AMBULATORY_CARE_PROVIDER_SITE_OTHER): Payer: Medicare Other | Admitting: Internal Medicine

## 2013-05-01 VITALS — BP 98/70 | HR 83 | Ht 69.0 in | Wt 109.8 lb

## 2013-05-01 DIAGNOSIS — J329 Chronic sinusitis, unspecified: Secondary | ICD-10-CM

## 2013-05-01 DIAGNOSIS — J302 Other seasonal allergic rhinitis: Secondary | ICD-10-CM

## 2013-05-01 DIAGNOSIS — R05 Cough: Secondary | ICD-10-CM

## 2013-05-01 DIAGNOSIS — R059 Cough, unspecified: Secondary | ICD-10-CM

## 2013-05-01 DIAGNOSIS — J309 Allergic rhinitis, unspecified: Secondary | ICD-10-CM

## 2013-05-01 NOTE — Progress Notes (Signed)
12/28/12- 41 yoF never smoker-Dr Wong/ [facility]-last seen by [facility] 2009; seen at [facility] chest 2002; chronic cough  Has seen Dr. Lamonte Sakai here and Dr Neldon Mc in the past. Complains of allergies, asthma, chronic cough for over 20 years. Help with inhalers and cough medicines. Using rescue inhaler once or twice a day. Not using Qvar now. Particularly bad in spring and fall. [place] smells musty when it rains. She thinks in retrospect that allergy shots in the past with more than she realized. She had one episode while on allergy shots, when she had throat tightness which scared her so she quit.. Occasional cough at night. Previous PFTs are filed, but as recently as 2009 showed reversible obstructive airways disease and small airways. Singulair blamed for paresthesias. Son smokes outside the home. History of sinus surgery, vocal cord polyp. Father had allergies and asthma  02/07/13- 62 yoF never smoker-Dr Wong/ [facility]-last seen by [place] 2009; seen at [facility] chest 2002; followed for chronic cough, allergic rhinitis  ACUTE VISIT:  Cough worsening over the past month.  Cough dry-persistant  cough worse in the past week. Losing weight attributed to poor appetite which she blames on acid indigestion despite pantoprazole/ elevated HOB. She's worried the chest x-ray suggests emphysema. Anoro blamed for chest pain. CXR 01/30/13 CLINICAL DATA: Cough.  EXAM:  CHEST 2 VIEW  COMPARISON: Chest radiograph 05/15/2012.  FINDINGS:  The cardiopericardial silhouette and mediastinal contours are within  normal limits. Hyperinflation and severe emphysema is present. No  airspace disease or pleural effusion is identified.  IMPRESSION:  Emphysema without acute cardiopulmonary disease.  Electronically Signed  By: Dereck Ligas M.D.  On: 01/30/2013 13:00  03/15/13- 62 yoF never smoker-Dr Wong/ [facility]-last seen by [place] 2009; seen at [facility] chest 2002; followed for chronic cough, allergic rhinitis  ACUTE VISIT: Cough since 01/2013.  Saw her PCP and was given prednisone with no relief. Reports cough, ear pain, chest tightness and congestion. Had outpatient bladder surgery and has been coughing up blood since then. Sinus surgery summer 2014. Now 5 days of increased head congestion. New hemoptysis, daily, since bladder surgery 5 days ago during which she was intubated. Now on cephalexin for UTI. Notices frontal headache, ears full. Treating leg cramps with bananas and mustard a1AT level 02/07/13- WNL  MM 167. CXR 01/30/13 IMPRESSION:  Emphysema without acute cardiopulmonary disease.  Electronically Signed  By: Dereck Ligas M.D.  On: 01/30/2013 13:00  03/29/13- 62 yoF never smoker (father smoked)- followed for chronic cough, allergic rhinitis , COPD by CT Pt c/o sinus pressure, headaches, ear pain, prod cough with sometimes clear mucous  She had prednisone in early Dec for cough, then reported streak hemoptysis and had CT. Has finished Keflex and prednisone with no more hemoptysis Admits head and sinus pressure. Taking Sudafed.  Has followup appointment with ENT in [place]. Allergy profile 12/28/2012 negative with total IgE 4.5. a1AT 02/07/13- WNL MM, 167 CT chest 03/25/13 IMPRESSION:  1. No CT evidence of acute pulmonary embolism.  2. Emphysema. No acute cardiopulmonary process identified.  Electronically Signed  By: Jeannine Boga M.D.  On: 03/25/2013 13:31  05/02/13- 85 yoF never smoker (father smoked)- followed for chronic cough, allergic rhinitis , COPD by CT No antihistmines, OTC cough syrups, or sleep aids in past 3 days; Pt having continues to have chest congestion, cough,"lump in throat feeling", sore throat as well. Husband is currently sick as well; wonders if she should have test today. ENT in [facility] gave augmentin 875, 3x daily> diarrhea  after 4 doses, but she thought it had started to help. He is scheduling CT sinus.  She bought colloidal silver preparation at [facility], but her  accupuncturist told her not to take it. Friend recommended alternative practitioner who gives Vit C infusions.  Main c/o is globus sensation with cough, maxillary facial pressure. Husband is sick. We are deferring allergy testing.  CT maxfac 05/13/2011 IMPRESSION:  No paranasal sinuses mucosal thickening or air-fluid levels.  Midline nasal septum. Question prior surgery bilateral medial wall  maxillary sinus and right ethmoid air cells.  Original Report Authenticated By: Lahoma Crocker, M.D. QuickVue Flu swab 25/15- NEG  ROS-see HPI Constitutional:   No-   weight loss, night sweats, fevers, chills, fatigue, lassitude. HEENT:   + headaches, no-difficulty swallowing, tooth/dental problems, +sore throat,       No-  sneezing, itching, ear ache, +nasal congestion, post nasal drip,  CV:  No-   chest pain, orthopnea, PND, swelling in lower extremities, anasarca, dizziness, palpitations Resp: No-   shortness of breath with exertion or at rest.              No-   productive cough,  + non-productive cough,  No- coughing up of blood.              No-   change in color of mucus.  No- wheezing.   Skin: No-   rash or lesions. GI:   No-heartburn, indigestion,  loss of appetite GU: . MS:  No-   joint pain or swelling.   Neuro-     nothing unusual Psych:  No- change in mood or affect. No depression or anxiety.  No memory loss.  OBJ- Physical Exam General- Alert, Oriented, Affect-appropriate, Distress- none acute, Trim. Sits hunched but can straighten. Skin- rash-none, lesions- none, excoriation- none Lymphadenopathy- none Head- atraumatic            Eyes- Gross vision intact, PERRLA, conjunctivae and secretions clear            Ears- Hearing, canals-normal            Nose- Clear, no-Septal dev, mucus, polyps, erosion, perforation             Throat- Mallampati II , mucosa clear , drainage- none, tonsils- atrophic.  +Incessant Throat                     clearing despite coaching to sip water.  Neck-  flexible , trachea midline, no stridor , thyroid nl, carotid no bruit Chest - symmetrical excursion , unlabored           Heart/CV- RRR , no murmur , no gallop  , no rub, nl s1 s2                           - JVD- none , edema- none, stasis changes- none, varices- none           Lung- clear to P&A, wheeze- none, cough+ with deep breath , dullness-none, rub- none. Long/thin thorax.           Chest wall-  Abd-  Br/ Gen/ Rectal- Not done, not indicated Extrem- cyanosis- none, clubbing, none, atrophy- none, strength- nl Neuro- grossly intact to observation

## 2013-05-01 NOTE — Telephone Encounter (Signed)
[email or web address].Gustavo Meditz, CMA  

## 2013-05-01 NOTE — Assessment & Plan Note (Signed)
Plan- reschedule skin testing

## 2013-05-01 NOTE — Patient Instructions (Signed)
Try taking your augmentin 1/2 tab, twice daily after meals, x 10 days.  You can also take a probiotic once daily  Sample Dymista nasal spray    1 puff in each nostril once daily at bedtime  Ok to try Allegra-D 12 hour once daily if needed  Order- lab flu assay  We can reschedule your allergy skin testing for a couple of months from now, when you are over this winter time problem.

## 2013-05-01 NOTE — Assessment & Plan Note (Signed)
Educated to soothe throat and avoid throat clearing which sustains irritability.

## 2013-05-01 NOTE — Assessment & Plan Note (Addendum)
Possible. Try using augmentin she has already paid for, at much lower dose. Flu swab(neative today). Ok Allegra-D, sample [facility]

## 2013-05-02 ENCOUNTER — Ambulatory Visit: Payer: Self-pay

## 2013-05-02 NOTE — Telephone Encounter (Signed)
Nothing further needed, pt had visit with Dr. Annamaria Boots.[place] Bing, CMA

## 2013-05-04 ENCOUNTER — Telehealth: Payer: Self-pay | Admitting: *Deleted

## 2013-05-04 NOTE — Telephone Encounter (Signed)
Wanted to know which mucinex was best to use otc? Told plain mucinex max was safest, pt wants to try the "D"- i explained if she did not have HTN- this would probably be ok

## 2013-05-06 ENCOUNTER — Other Ambulatory Visit: Payer: Self-pay | Admitting: Family Medicine

## 2013-05-07 NOTE — Telephone Encounter (Signed)
04/12/13 B [place]  If approved route to nurse to [facility]

## 2013-05-09 ENCOUNTER — Ambulatory Visit: Payer: Self-pay

## 2013-05-10 ENCOUNTER — Telehealth: Payer: Self-pay | Admitting: Family Medicine

## 2013-05-10 NOTE — Telephone Encounter (Signed)
error 

## 2013-05-13 ENCOUNTER — Ambulatory Visit (INDEPENDENT_AMBULATORY_CARE_PROVIDER_SITE_OTHER): Payer: Medicare Other | Admitting: Family Medicine

## 2013-05-13 ENCOUNTER — Other Ambulatory Visit: Payer: Self-pay

## 2013-05-13 ENCOUNTER — Encounter: Payer: Self-pay | Admitting: Family Medicine

## 2013-05-13 ENCOUNTER — Telehealth: Payer: Self-pay | Admitting: Family Medicine

## 2013-05-13 VITALS — BP 106/71 | HR 86 | Temp 97.9°F | Ht 69.0 in | Wt 112.2 lb

## 2013-05-13 DIAGNOSIS — F411 Generalized anxiety disorder: Secondary | ICD-10-CM

## 2013-05-13 DIAGNOSIS — R197 Diarrhea, unspecified: Secondary | ICD-10-CM | POA: Insufficient documentation

## 2013-05-13 DIAGNOSIS — J45909 Unspecified asthma, uncomplicated: Secondary | ICD-10-CM | POA: Insufficient documentation

## 2013-05-13 DIAGNOSIS — J310 Chronic rhinitis: Secondary | ICD-10-CM

## 2013-05-13 MED ORDER — BECLOMETHASONE DIPROPIONATE 40 MCG/ACT IN AERS
2.0000 | INHALATION_SPRAY | Freq: Two times a day (BID) | RESPIRATORY_TRACT | Status: DC
Start: 2013-05-13 — End: 2013-12-25

## 2013-05-13 MED ORDER — ALPRAZOLAM 0.5 MG PO TABS: 0.5000 mg | ORAL_TABLET | Freq: Three times a day (TID) | ORAL | Status: DC | PRN

## 2013-05-13 NOTE — Telephone Encounter (Signed)
appt today at 2 with wong

## 2013-05-13 NOTE — Telephone Encounter (Signed)
Last seen 04/12/13  B [place]  If approved route to nurse to call [facility]

## 2013-05-13 NOTE — Progress Notes (Signed)
Patient ID: Kristy Garza, female   DOB: 1951-02-10, 63 y.o.   MRN: [id number] SUBJECTIVE: CC: Chief Complaint  Patient presents with  . Acute Visit    sinus dairrhea states sinus been going on for 11 weeks and has seen [facility],Dr Sallye Lat, Dr C. young and her ENT Dr Wendie Chess     HPI: As above , ongoing nasal congestion. Has loose stools today. This is a long standing problem with her sinuses. She has an appointment in 3 days with ENT. Last antibiotic is 8 days ago.she has had many visits with Primary care and specialists and her symptoms persists.  She also needs a refill of her xanax for her anxiety.  Past Medical History  Diagnosis Date  . Asthma   . Osteoporosis   . Acid reflux   . PONV (postoperative nausea and vomiting)   . Tinnitus   . Allergy    Past Surgical History  Procedure Laterality Date  . Abdominal hysterectomy  2000  . 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 Ethmoidectomy  2012  . Septoplasty  1980's  . Cystoscopy with hydrodistension and biopsy N/A 06/11/2012    Procedure: CYSTOSCOPY/BIOPSY/HYDRODISTENSION with Instillation of Pyridium and Marcaine and Kenalog;  Surgeon: Ailene Rud, MD;  Location: [facility];  Service: Urology;  Laterality: N/A;  1 hour requested for this case  BLADDER BIOPSY   History   Social History  . Marital Status: Married    Spouse Name: N/A    Number of Children: N/A  . Years of Education: N/A   Occupational History  . unemployeed    Social History Main Topics  . Smoking status: Never Smoker   . Smokeless tobacco: Not on file  . Alcohol Use: No  . Drug Use: No  . Sexual Activity: Not on file   Other Topics Concern  . Not on file   Social History Narrative  . No narrative on file   Family History  Problem Relation Age of Onset  . Hyperlipidemia Mother   . Cancer Father   . Allergies Father    Current Outpatient Prescriptions on File Prior to Visit  Medication Sig  Dispense Refill  . albuterol (PROVENTIL) (2.5 MG/3ML) 0.083% nebulizer solution Take 3 mLs (2.5 mg total) by nebulization every 6 (six) hours as needed for wheezing or shortness of breath.  150 mL  1  . benzonatate (TESSALON) 100 MG capsule TAKE 1-2 CAPSULES (100-200 MG TOTAL) BY MOUTH 3 (THREE) TIMES DAILY AS NEEDED FOR COUGH.  40 capsule  0  . Calcium Glycerophosphate (PRELIEF) 340 (65-50) MG (CA-P) TABS Take 1 capsule by mouth 3 (three) times daily before meals.  90 each  11  . Calcium-Magnesium-Vitamin D (CITRACAL CALCIUM+D PO) Take 1 tablet by mouth 2 (two) times daily.       [name] denosumab (PROLIA) 60 MG/ML SOLN injection Inject 60 mg into the skin every 6 (six) months. Administer in upper arm, thigh, or abdomen      . pantoprazole (PROTONIX) 40 MG tablet Take 40 mg by mouth daily.      . pentosan polysulfate (ELMIRON) 100 MG capsule Take 1 capsule (100 mg total) by mouth 3 (three) times daily before meals.  90 capsule  6  . Probiotic Product (ALIGN) 4 MG CAPS Take 1 capsule by mouth daily.      . Quercetin (QUERCITIN) POWD by Does not apply route.      . lidocaine (XYLOCAINE)  2 % jelly Apply topically as needed.      . nystatin (MYCOSTATIN) 100000 UNIT/ML suspension Take 5 mLs (500,000 Units total) by mouth 4 (four) times daily. Swish and swallow  150 mL  0   Current Facility-Administered Medications on File Prior to Visit  Medication Dose Route Frequency Provider Last Rate Last Dose  . bupivacaine (MARCAINE) 0.5 % 10 mL, triamcinolone acetonide (KENALOG-40) 40 mg injection   Subcutaneous Once Ailene Rud, MD      . bupivacaine (MARCAINE) 0.5 % 15 mL, phenazopyridine (PYRIDIUM) 400 mg bladder mixture   Bladder Instillation Once Ailene Rud, MD       Allergies  Allergen Reactions  . Augmentin [Amoxicillin-Pot Clavulanate] Diarrhea  . Avelox [Moxifloxacin Hcl In Nacl] Other (See Comments)    Tremors   . Biaxin [Clarithromycin]     Unsure of reaction  . Cedax [Ceftibuten]  Other (See Comments)    tremors  . Clindamycin/Lincomycin     tachycardia  . Doxycycline Diarrhea  . Levofloxacin Other (See Comments)    Feels dehydrated  . Singulair [Montelukast Sodium] Other (See Comments)    Numbness and tingling in hand.  . Sulfonamide Derivatives Other (See Comments)    Gi upset   Immunization History  Administered Date(s) Administered  . Influenza,inj,Quad PF,36+ Mos 12/28/2012  . Pneumococcal Conjugate-13 02/07/2013   Prior to Admission medications   Medication Sig Start Date End Date Taking? Authorizing Provider  albuterol (PROVENTIL) (2.5 MG/3ML) 0.083% nebulizer solution Take 3 mLs (2.5 mg total) by nebulization every 6 (six) hours as needed for wheezing or shortness of breath. 04/06/13  Yes Lysbeth Penner, FNP  ALPRAZolam Duanne Moron) 0.5 MG tablet Take 1 tablet (0.5 mg total) by mouth 3 (three) times daily as needed for anxiety. 03/15/13  Yes Deneise Lever, MD  benzonatate (TESSALON) 100 MG capsule TAKE 1-2 CAPSULES (100-200 MG TOTAL) BY MOUTH 3 (THREE) TIMES DAILY AS NEEDED FOR COUGH. 05/06/13  Yes Lysbeth Penner, FNP  Calcium Glycerophosphate (PRELIEF) 340 (65-50) MG (CA-P) TABS Take 1 capsule by mouth 3 (three) times daily before meals. 06/11/12  Yes Sigmund Joya San, MD  Calcium-Magnesium-Vitamin D (CITRACAL CALCIUM+D PO) Take 1 tablet by mouth 2 (two) times daily.    Yes Historical Provider, MD  denosumab (PROLIA) 60 MG/ML SOLN injection Inject 60 mg into the skin every 6 (six) months. Administer in upper arm, thigh, or abdomen   Yes Historical Provider, MD  fluticasone (FLONASE) 50 MCG/ACT nasal spray Place 1 spray into both nostrils daily.   Yes Historical Provider, MD  guaiFENesin (MUCINEX) 600 MG 12 hr tablet Take 600 mg by mouth 2 (two) times daily.   Yes Historical Provider, MD  mometasone (NASONEX) 50 MCG/ACT nasal spray Place 2 sprays into the nose daily.   Yes Historical Provider, MD  pantoprazole (PROTONIX) 40 MG tablet Take 40 mg by mouth  daily.   Yes Historical Provider, MD  pentosan polysulfate (ELMIRON) 100 MG capsule Take 1 capsule (100 mg total) by mouth 3 (three) times daily before meals. 06/11/12  Yes Ailene Rud, MD  Probiotic Product (ALIGN) 4 MG CAPS Take 1 capsule by mouth daily.   Yes Historical Provider, MD  Quercetin (QUERCITIN) POWD by Does not apply route.   Yes Historical Provider, MD  beclomethasone (QVAR) 40 MCG/ACT inhaler Inhale 2 puffs into the lungs 2 (two) times daily.    Historical Provider, MD  lidocaine (XYLOCAINE) 2 % jelly Apply topically as needed. 01/25/13   Historical  Provider, MD  nystatin (MYCOSTATIN) 100000 UNIT/ML suspension Take 5 mLs (500,000 Units total) by mouth 4 (four) times daily. Swish and swallow 04/03/13   Deneise Lever, MD     ROS: As above in the HPI. All other systems are stable or negative.  OBJECTIVE: APPEARANCE:  Patient in no acute distress.The patient appeared well nourished and normally developed. Acyanotic. Waist: VITAL SIGNS:BP 106/71  Pulse 86  Temp(Src) 97.9 F (36.6 C) (Oral)  Ht 5\' 9"  (1.753 m)  Wt 112 lb 3.2 oz (50.894 kg)  BMI 16.56 kg/m2 Thin WF  SKIN: warm and  Dry without overt rashes, tattoos and scars  HEAD and Neck: without JVD, Head and scalp: normal Eyes:No scleral icterus. Fundi normal, eye movements normal. Ears: Auricle normal, canal normal, Tympanic membranes normal, insufflation normal. Nose: mild rhinorrhea. Mild nasal congestion. Chronic and persistent throat clearing by patient. Throat: normal Neck & thyroid: normal  CHEST & LUNGS: Chest wall: normal Lungs: Clear  CVS: Reveals the PMI to be normally located. Regular rhythm, First and Second Heart sounds are normal,  absence of murmurs, rubs or gallops. Peripheral vasculature: Radial pulses: normal Dorsal pedis pulses: normal Posterior pulses: normal  ABDOMEN:  Appearance: normal Benign, no organomegaly, no masses, no Abdominal Aortic enlargement. No Guarding , no  rebound. No Bruits. Bowel sounds: normal  RECTAL: N/A GU: N/A  EXTREMETIES: nonedematous.  NEUROLOGIC: oriented to time,place and person; nonfocal.  ASSESSMENT: Anxiety state, unspecified - Plan: ALPRAZolam (XANAX) 0.5 MG tablet  Diarrhea - Plan: Stool culture, Clostridium difficile EIA, Ova and parasite examination  Asthma, chronic  Chronic rhinitis  PLAN: Discussed with patient her chronic  Disorder. She has been under the care of ENT specialists without resolution of her symptoms. She has had this for years without complications. Will check for C diff colitis. avoid antibiotics.  patient to follow up with her ENT as planned.  Not much else to offer.  Orders Placed This Encounter  Procedures  . Stool culture  . Clostridium difficile EIA  . Ova and parasite examination   Meds ordered this encounter  Medications  . mometasone (NASONEX) 50 MCG/ACT nasal spray    Sig: Place 2 sprays into the nose daily.  . fluticasone (FLONASE) 50 MCG/ACT nasal spray    Sig: Place 1 spray into both nostrils daily.  [name] guaiFENesin (MUCINEX) 600 MG 12 hr tablet    Sig: Take 600 mg by mouth 2 (two) times daily.  [name] ALPRAZolam (XANAX) 0.5 MG tablet    Sig: Take 1 tablet (0.5 mg total) by mouth 3 (three) times daily as needed for anxiety.    Dispense:  50 tablet    Refill:  0  . beclomethasone (QVAR) 40 MCG/ACT inhaler    Sig: Inhale 2 puffs into the lungs 2 (two) times daily.    Dispense:  1 Inhaler    Refill:  1   Medications Discontinued During This Encounter  Medication Reason  . ALPRAZolam (XANAX) 0.5 MG tablet Reorder  . beclomethasone (QVAR) 40 MCG/ACT inhaler Reorder   Return if symptoms worsen or fail to improve.  TRUE Garciamartinez P. Jacelyn Grip, M.D.

## 2013-05-15 ENCOUNTER — Other Ambulatory Visit: Payer: Medicare Other

## 2013-05-15 DIAGNOSIS — R197 Diarrhea, unspecified: Secondary | ICD-10-CM

## 2013-05-15 MED ORDER — ALPRAZOLAM 0.5 MG PO TABS: 0.5000 mg | ORAL_TABLET | Freq: Three times a day (TID) | ORAL | Status: DC | PRN

## 2013-05-17 LAB — OVA AND PARASITE EXAMINATION

## 2013-05-17 LAB — SPECIMEN STATUS REPORT

## 2013-05-17 LAB — CLOSTRIDIUM DIFFICILE BY PCR: CDIFFPCR: NEGATIVE

## 2013-05-20 LAB — STOOL CULTURE: E COLI SHIGA TOXIN ASSAY: NEGATIVE

## 2013-05-20 LAB — SPECIMEN STATUS REPORT

## 2013-05-21 ENCOUNTER — Telehealth: Payer: Self-pay | Admitting: Internal Medicine

## 2013-05-21 DIAGNOSIS — R0981 Nasal congestion: Secondary | ICD-10-CM

## 2013-05-21 NOTE — Telephone Encounter (Signed)
Ok to take Sudafed or Sudafed-PE as needed.

## 2013-05-21 NOTE — Telephone Encounter (Signed)
Ok to cancel our order for CT sinus

## 2013-05-21 NOTE — Telephone Encounter (Signed)
Pt called back in stating does not need CT sinus done had CT done with ENT a few weeks ago and was normal Will just continue sudafed and come have CBC w/diff Just FYI CY, thanks

## 2013-05-21 NOTE — Telephone Encounter (Signed)
Pt is already taking Sudafed.  Per CY - CT Sinus needs to be completed as well as CBC with diff.  Orders will be placed. Pt is aware.

## 2013-05-21 NOTE — Telephone Encounter (Signed)
Spoke with pt. Still having lots of sinus congestion. She saw ENT and they think she has GERD. Has been using Afrin with relief and Sudafed. This has been going on for 12 weeks. Wants to know what to do now, needs relief!  Allergies  Allergen Reactions  . Augmentin [Amoxicillin-Pot Clavulanate] Diarrhea  . Avelox [Moxifloxacin Hcl In Nacl] Other (See Comments)    Tremors   . Biaxin [Clarithromycin]     Unsure of reaction  . Cedax [Ceftibuten] Other (See Comments)    tremors  . Clindamycin/Lincomycin     tachycardia  . Doxycycline Diarrhea  . Levofloxacin Other (See Comments)    Feels dehydrated  . Singulair [Montelukast Sodium] Other (See Comments)    Numbness and tingling in hand.  . Sulfonamide Derivatives Other (See Comments)    Gi upset    Current Outpatient Prescriptions on File Prior to Visit  Medication Sig Dispense Refill  . albuterol (PROVENTIL) (2.5 MG/3ML) 0.083% nebulizer solution Take 3 mLs (2.5 mg total) by nebulization every 6 (six) hours as needed for wheezing or shortness of breath.  150 mL  1  . ALPRAZolam (XANAX) 0.5 MG tablet Take 1 tablet (0.5 mg total) by mouth 3 (three) times daily as needed for anxiety.  50 tablet  0  . ALPRAZolam (XANAX) 0.5 MG tablet Take 1 tablet (0.5 mg total) by mouth 3 (three) times daily as needed for anxiety.  50 tablet  0  . beclomethasone (QVAR) 40 MCG/ACT inhaler Inhale 2 puffs into the lungs 2 (two) times daily.  1 Inhaler  1  . benzonatate (TESSALON) 100 MG capsule TAKE 1-2 CAPSULES (100-200 MG TOTAL) BY MOUTH 3 (THREE) TIMES DAILY AS NEEDED FOR COUGH.  40 capsule  0  . Calcium Glycerophosphate (PRELIEF) 340 (65-50) MG (CA-P) TABS Take 1 capsule by mouth 3 (three) times daily before meals.  90 each  11  . Calcium-Magnesium-Vitamin D (CITRACAL CALCIUM+D PO) Take 1 tablet by mouth 2 (two) times daily.       [name] denosumab (PROLIA) 60 MG/ML SOLN injection Inject 60 mg into the skin every 6 (six) months. Administer in upper arm,  thigh, or abdomen      . fluticasone (FLONASE) 50 MCG/ACT nasal spray Place 1 spray into both nostrils daily.      [name] guaiFENesin (MUCINEX) 600 MG 12 hr tablet Take 600 mg by mouth 2 (two) times daily.      [name] lidocaine (XYLOCAINE) 2 % jelly Apply topically as needed.      . mometasone (NASONEX) 50 MCG/ACT nasal spray Place 2 sprays into the nose daily.      [name] nystatin (MYCOSTATIN) 100000 UNIT/ML suspension Take 5 mLs (500,000 Units total) by mouth 4 (four) times daily. Swish and swallow  150 mL  0  . pantoprazole (PROTONIX) 40 MG tablet Take 40 mg by mouth daily.      . pentosan polysulfate (ELMIRON) 100 MG capsule Take 1 capsule (100 mg total) by mouth 3 (three) times daily before meals.  90 capsule  6  . Probiotic Product (ALIGN) 4 MG CAPS Take 1 capsule by mouth daily.      . Quercetin (QUERCITIN) POWD by Does not apply route.       Current Facility-Administered Medications on File Prior to Visit  Medication Dose Route Frequency Provider Last Rate Last Dose  . bupivacaine (MARCAINE) 0.5 % 10 mL, triamcinolone acetonide (KENALOG-40) 40 mg injection   Subcutaneous Once Ailene Rud, MD      .  bupivacaine (MARCAINE) 0.5 % 15 mL, phenazopyridine (PYRIDIUM) 400 mg bladder mixture   Bladder Instillation Once Ailene Rud, MD        CY - please advise. Thanks.

## 2013-05-22 MED ORDER — CEFDINIR 300 MG PO CAPS: 300.0000 mg | ORAL_CAPSULE | Freq: Two times a day (BID) | ORAL | Status: DC

## 2013-05-22 NOTE — Telephone Encounter (Signed)
Offer cefdinir 300 mg, # 14, 1 twice daily 

## 2013-05-22 NOTE — Telephone Encounter (Signed)
Last OV 05-01-13. I spoke with the pt and advised CT cancelled. Pt states this morning she did her nasal rinses and noticed her mucus was a brown/green color. She states she has also been having ear pressure, facial pain and pressure in her cheeks. Pt feels like she has an infections and needs an antibiotic. Please advise. [place] Bing, CMA Allergies  Allergen Reactions  . Augmentin [Amoxicillin-Pot Clavulanate] Diarrhea  . Avelox [Moxifloxacin Hcl In Nacl] Other (See Comments)    Tremors   . Biaxin [Clarithromycin]     Unsure of reaction  . Cedax [Ceftibuten] Other (See Comments)    tremors  . Clindamycin/Lincomycin     tachycardia  . Doxycycline Diarrhea  . Levofloxacin Other (See Comments)    Feels dehydrated  . Singulair [Montelukast Sodium] Other (See Comments)    Numbness and tingling in hand.  . Sulfonamide Derivatives Other (See Comments)    Gi upset

## 2013-05-22 NOTE — Telephone Encounter (Signed)
Patient states she has a sinus infection and having problems with her ears.  Asking for rx.  [facility] [place] or [phone]

## 2013-05-22 NOTE — Telephone Encounter (Signed)
Spoke with pt and advised of Dr Young's recommendations.  Rx sent to pharmacy. 

## 2013-05-24 ENCOUNTER — Telehealth: Payer: Self-pay | Admitting: Internal Medicine

## 2013-05-24 NOTE — Telephone Encounter (Signed)
Called and spoke with pt and she stated that she wanted to see if CY thinks that the nasocort would work better than the flonase.   She stated that she is going to hold the sudafed since she is feeling to dry.      Spoke with CY and he stated that the pt is ok to try the nasocort to see which nasal spray works better for her.  Both of these are otc now.  Pt  Voiced her understanding and nothing further is needed.

## 2013-05-27 ENCOUNTER — Other Ambulatory Visit: Payer: Medicare Other

## 2013-05-27 ENCOUNTER — Telehealth: Payer: Self-pay | Admitting: *Deleted

## 2013-05-27 NOTE — Telephone Encounter (Signed)
Last OV 03-29-13. Spoke with the pt and she states she is taking cefdinir twice a day as prescribed but feels like this is just to strong for her. She states it does not make her feel good when she takes it. She wants to know can she either take one tablet a day instead of 2 a day, or can she just stop it all together? She is still having sinus congestion, but feel this is better. Please advise. [place] Bing, CMA Allergies  Allergen Reactions  . Augmentin [Amoxicillin-Pot Clavulanate] Diarrhea  . Avelox [Moxifloxacin Hcl In Nacl] Other (See Comments)    Tremors   . Biaxin [Clarithromycin]     Unsure of reaction  . Cedax [Ceftibuten] Other (See Comments)    tremors  . Clindamycin/Lincomycin     tachycardia  . Doxycycline Diarrhea  . Levofloxacin Other (See Comments)    Feels dehydrated  . Singulair [Montelukast Sodium] Other (See Comments)    Numbness and tingling in hand.  . Sulfonamide Derivatives Other (See Comments)    Gi upset

## 2013-05-27 NOTE — Telephone Encounter (Signed)
Pt advised. Supriya Beaston, CMA  

## 2013-05-27 NOTE — Telephone Encounter (Signed)
Suggest she take 1/2 tab cefdinir twice daily after meals for a total of 7 days of treatment.

## 2013-05-28 ENCOUNTER — Telehealth: Payer: Self-pay | Admitting: Internal Medicine

## 2013-05-28 NOTE — Telephone Encounter (Signed)
Spoke with patient-looks as though someone already scheduled her an OV with CY on Thursday at 9:45am.

## 2013-05-28 NOTE — Telephone Encounter (Signed)
Ok to schedule routine ov when I can, or see TP or her ENT

## 2013-05-28 NOTE — Telephone Encounter (Signed)
I spoke with the pt and she states she is not feeling any better. She is still having a lot of sinus congestion and sinus pressure. Pt states when she does her sinus rinses she gets a lot of mucus out but it is clear. Pt states she is taking phenlephrine, mucinex, flonase without relief. Pt is requesting an appt. Please advise. [place] Bing, CMA Allergies  Allergen Reactions  . Augmentin [Amoxicillin-Pot Clavulanate] Diarrhea  . Avelox [Moxifloxacin Hcl In Nacl] Other (See Comments)    Tremors   . Biaxin [Clarithromycin]     Unsure of reaction  . Cedax [Ceftibuten] Other (See Comments)    tremors  . Clindamycin/Lincomycin     tachycardia  . Doxycycline Diarrhea  . Levofloxacin Other (See Comments)    Feels dehydrated  . Singulair [Montelukast Sodium] Other (See Comments)    Numbness and tingling in hand.  . Sulfonamide Derivatives Other (See Comments)    Gi upset

## 2013-05-29 ENCOUNTER — Other Ambulatory Visit: Payer: Self-pay | Admitting: Family Medicine

## 2013-05-29 DIAGNOSIS — Z1231 Encounter for screening mammogram for malignant neoplasm of breast: Secondary | ICD-10-CM

## 2013-05-30 ENCOUNTER — Ambulatory Visit (HOSPITAL_COMMUNITY): Payer: Medicare Other

## 2013-05-30 ENCOUNTER — Ambulatory Visit (INDEPENDENT_AMBULATORY_CARE_PROVIDER_SITE_OTHER): Payer: Medicare Other | Admitting: Internal Medicine

## 2013-05-30 ENCOUNTER — Other Ambulatory Visit: Payer: Medicare Other

## 2013-05-30 ENCOUNTER — Encounter: Payer: Self-pay | Admitting: Internal Medicine

## 2013-05-30 VITALS — BP 100/58 | HR 80 | Ht 69.0 in | Wt 111.6 lb

## 2013-05-30 DIAGNOSIS — R059 Cough, unspecified: Secondary | ICD-10-CM

## 2013-05-30 DIAGNOSIS — J310 Chronic rhinitis: Secondary | ICD-10-CM

## 2013-05-30 DIAGNOSIS — D849 Immunodeficiency, unspecified: Secondary | ICD-10-CM

## 2013-05-30 DIAGNOSIS — R05 Cough: Secondary | ICD-10-CM

## 2013-05-30 DIAGNOSIS — K219 Gastro-esophageal reflux disease without esophagitis: Secondary | ICD-10-CM

## 2013-05-30 DIAGNOSIS — J440 Chronic obstructive pulmonary disease with acute lower respiratory infection: Secondary | ICD-10-CM

## 2013-05-30 DIAGNOSIS — J45909 Unspecified asthma, uncomplicated: Secondary | ICD-10-CM

## 2013-05-30 MED ORDER — BENZONATATE 200 MG PO CAPS: ORAL_CAPSULE | ORAL | Status: DC

## 2013-05-30 MED ORDER — METHYLPREDNISOLONE ACETATE 80 MG/ML IJ SUSP
80.0000 mg | Freq: Once | INTRAMUSCULAR | Status: AC
  Administered 2013-05-30: 80 mg via INTRAMUSCULAR

## 2013-05-30 NOTE — Progress Notes (Signed)
12/28/12- 41 yoF never smoker-Dr Wong/ [facility]-last seen by [facility] 2009; seen at [facility] chest 2002; chronic cough  Has seen Dr. Lamonte Sakai here and Dr Neldon Mc in the past. Complains of allergies, asthma, chronic cough for over 20 years. Help with inhalers and cough medicines. Using rescue inhaler once or twice a day. Not using Qvar now. Particularly bad in spring and fall. [place] smells musty when it rains. She thinks in retrospect that allergy shots in the past with more than she realized. She had one episode while on allergy shots, when she had throat tightness which scared her so she quit.. Occasional cough at night. Previous PFTs are filed, but as recently as 2009 showed reversible obstructive airways disease and small airways. Singulair blamed for paresthesias. Son smokes outside the home. History of sinus surgery, vocal cord polyp. Father had allergies and asthma  02/07/13- 62 yoF never smoker-Dr Wong/ [facility]-last seen by [facility] 2009; seen at [facility] chest 2002; followed for chronic cough, allergic rhinitis  ACUTE VISIT:  Cough worsening over the past month.  Cough dry-persistant  cough worse in the past week. Losing weight attributed to poor appetite which she blames on acid indigestion despite pantoprazole/ elevated HOB. She's worried the chest x-ray suggests emphysema. Anoro blamed for chest pain. CXR 01/30/13 CLINICAL DATA: Cough.  EXAM:  CHEST 2 VIEW  COMPARISON: Chest radiograph 05/15/2012.  FINDINGS:  The cardiopericardial silhouette and mediastinal contours are within  normal limits. Hyperinflation and severe emphysema is present. No  airspace disease or pleural effusion is identified.  IMPRESSION:  Emphysema without acute cardiopulmonary disease.  Electronically Signed  By: Dereck Ligas M.D.  On: 01/30/2013 13:00  03/15/13- 62 yoF never smoker-Dr Wong/ [facility]-last seen by [place] 2009; seen at [facility] chest 2002; followed for chronic cough, allergic rhinitis  ACUTE VISIT: Cough since 01/2013.  Saw her PCP and was given prednisone with no relief. Reports cough, ear pain, chest tightness and congestion. Had outpatient bladder surgery and has been coughing up blood since then. Sinus surgery summer 2014. Now 5 days of increased head congestion. New hemoptysis, daily, since bladder surgery 5 days ago during which she was intubated. Now on cephalexin for UTI. Notices frontal headache, ears full. Treating leg cramps with bananas and mustard a1AT level 02/07/13- WNL  MM 167. CXR 01/30/13 IMPRESSION:  Emphysema without acute cardiopulmonary disease.  Electronically Signed  By: Dereck Ligas M.D.  On: 01/30/2013 13:00  03/29/13- 62 yoF never smoker (father smoked)- followed for chronic cough, allergic rhinitis , COPD by CT Pt c/o sinus pressure, headaches, ear pain, prod cough with sometimes clear mucous  She had prednisone in early Dec for cough, then reported streak hemoptysis and had CT. Has finished Keflex and prednisone with no more hemoptysis Admits head and sinus pressure. Taking Sudafed.  Has followup appointment with ENT in [place]. Allergy profile 12/28/2012 negative with total IgE 4.5. a1AT 02/07/13- WNL MM, 167 CT chest 03/25/13 IMPRESSION:  1. No CT evidence of acute pulmonary embolism.  2. Emphysema. No acute cardiopulmonary process identified.  Electronically Signed  By: Jeannine Boga M.D.  On: 03/25/2013 13:31  05/02/13- 85 yoF never smoker (father smoked)- followed for chronic cough, allergic rhinitis , COPD by CT No antihistmines, OTC cough syrups, or sleep aids in past 3 days; Pt having continues to have chest congestion, cough,"lump in throat feeling", sore throat as well. Husband is currently sick as well; wonders if she should have test today. ENT in [facility] gave augmentin 875, 3x daily> diarrhea  after 4 doses, but she thought it had started to help. He is scheduling CT sinus.  She bought colloidal silver preparation at [facility], but her  accupuncturist told her not to take it. Friend recommended alternative practitioner who gives Vit C infusions.  Main c/o is globus sensation with cough, maxillary facial pressure. Husband is sick. We are deferring allergy testing.  CT maxfac 05/13/2011 IMPRESSION:  No paranasal sinuses mucosal thickening or air-fluid levels.  Midline nasal septum. Question prior surgery bilateral medial wall  maxillary sinus and right ethmoid air cells.  Original Report Authenticated By: Lahoma Crocker, M.D. QuickVue Flu swab 25/15- NEG  05/30/13- 69 yoF never smoker (father smoked)- followed for chronic cough, allergic rhinitis , COPD by CT FOLLOWS FOR:  c/o: sinus pressure and cough with clear thick mucus and ringing in ears still. Raspy throat, but much better than before. Taking cefdinir 300 mg once daily for tolerance- 3 more days. ENT said sinuses ok at [facility]. Asks depo- prefers over prednisone.  ROS-see HPI Constitutional:   No-   weight loss, night sweats, fevers, chills, fatigue, lassitude. HEENT:   + headaches, no-difficulty swallowing, tooth/dental problems, +sore throat,       No-  sneezing, itching, ear ache, +nasal congestion, post nasal drip,  CV:  No-   chest pain, orthopnea, PND, swelling in lower extremities, anasarca, dizziness, palpitations Resp: No-   shortness of breath with exertion or at rest.              No-   productive cough,  + non-productive cough,  No- coughing up of blood.              No-   change in color of mucus.  No- wheezing.   Skin: No-   rash or lesions. GI:   No-heartburn, indigestion,  loss of appetite GU: . MS:  No-   joint pain or swelling.   Neuro-     nothing unusual Psych:  No- change in mood or affect. No depression or anxiety.  No memory loss.  OBJ- Physical Exam General- Alert, Oriented, Affect-appropriate, Distress- none acute, Trim. Sits hunched but can straighten. Skin- rash-none, lesions- none, excoriation- none Lymphadenopathy- none Head- atraumatic             Eyes- Gross vision intact, PERRLA, conjunctivae and secretions clear            Ears- Hearing, canals-normal            Nose- Clear, no-Septal dev, mucus, polyps, erosion, perforation             Throat- Mallampati II , mucosa clear , drainage- none, tonsils- atrophic.  +Incessant Throat clearing again noted.  Neck- flexible , trachea midline, no stridor , thyroid nl, carotid no bruit Chest - symmetrical excursion , unlabored           Heart/CV- RRR , no murmur , no gallop  , no rub, nl s1 s2                           - JVD- none , edema- none, stasis changes- none, varices- none           Lung- clear to P&A, wheeze- none, cough+ with deep breath , dullness-none, rub- none. Long/thin thorax.           Chest wall-  Abd-  Br/ Gen/ Rectal- Not done, not indicated Extrem- cyanosis- none, clubbing, none, atrophy- none, strength-  nl Neuro- grossly intact to observation

## 2013-05-30 NOTE — Patient Instructions (Addendum)
Order- lab- immunoglobulins  IgE, IgG, IgA, IgM   Dx immunodeficiency  Depo 80  Finish the cefdinir  Refill benzonate sent

## 2013-05-31 ENCOUNTER — Telehealth: Payer: Self-pay | Admitting: Internal Medicine

## 2013-05-31 LAB — IGG, IGA, IGM
IGG (IMMUNOGLOBIN G), SERUM: 864 mg/dL (ref 690–1700)
IgA: 121 mg/dL (ref 69–380)
IgM, Serum: 102 mg/dL (ref 52–322)

## 2013-05-31 LAB — IGE: IgE (Immunoglobulin E), Serum: 3.6 IU/mL (ref 0.0–180.0)

## 2013-05-31 NOTE — Telephone Encounter (Signed)
Pt aware of results. Pt asked if CY could call something in for her facial pain-CY declined to call in anything as its not something we are treating her for and she should contact her PCP. Pt is aware.

## 2013-06-05 ENCOUNTER — Telehealth: Payer: Self-pay | Admitting: Internal Medicine

## 2013-06-05 MED ORDER — ACRIVASTINE-PSEUDOEPHEDRINE 8-60 MG PO CAPS: 1.0000 | ORAL_CAPSULE | Freq: Every day | ORAL | Status: DC

## 2013-06-05 NOTE — Telephone Encounter (Signed)
Offer Semprex-D, # 10, 1 daily as decongestant

## 2013-06-05 NOTE — Telephone Encounter (Signed)
Pt is aware. She is wanting [name] to send the prescription to a different pharmacy. This has been sent to [facility] in [place]. Nothing further is needed.

## 2013-06-05 NOTE — Telephone Encounter (Signed)
Spoke with pt - She c/o popping in right ear with some pressure - Some sinuus congestion - Dry cough - Finished Cefdinir 2 days ago - Still taking Tessalon and Hydrocodone cough syrup prn - Please advise  Allergies  Allergen Reactions  . Augmentin [Amoxicillin-Pot Clavulanate] Diarrhea  . Avelox [Moxifloxacin Hcl In Nacl] Other (See Comments)    Tremors   . Biaxin [Clarithromycin]     Unsure of reaction  . Cedax [Ceftibuten] Other (See Comments)    tremors  . Clindamycin/Lincomycin     tachycardia  . Doxycycline Diarrhea  . Levofloxacin Other (See Comments)    Feels dehydrated  . Singulair [Montelukast Sodium] Other (See Comments)    Numbness and tingling in hand.  . Sulfonamide Derivatives Other (See Comments)    Gi upset

## 2013-06-05 NOTE — Telephone Encounter (Signed)
If she isn't comfortable waiting to try the Semprex-D (which might or might not work when she gets it), then I suggest she go ahead and see her ENT again.

## 2013-06-05 NOTE — Telephone Encounter (Signed)
Pt aware of recs. rx sent. Nothing further needed

## 2013-06-05 NOTE — Telephone Encounter (Signed)
[facility] with [facility] in [place] and they do not carry Semprex D and cannot get it until 06/10/13.  Also [facility] and they do not carry this either.Please advise if you recommend something different and also pt questioned whether she should see her ENT or not?

## 2013-06-07 ENCOUNTER — Telehealth: Payer: Self-pay | Admitting: Internal Medicine

## 2013-06-07 NOTE — Telephone Encounter (Signed)
Suggest Kristy Garza ask her primary doctor for referral to someone for evaluation of jaw and TM joint pain- probably an orthodontist. She could also ask her dentist who to see.

## 2013-06-07 NOTE — Telephone Encounter (Signed)
I called and spoke with pt. She reports she saw ENT. Was advised they did not think it was viral. They advised her it was from when she clinches her teeth causing her teeth, gum, ear pain. She wants to know if Dr. Annamaria Boots thinks this is so. If not then she wants [name] to order the simprex d from the pharmacy. Please advise Dr. Annamaria Boots thanks

## 2013-06-07 NOTE — Telephone Encounter (Signed)
Called spoke with pt. Made her aware.  [facility] was advised they can't order semprex-d. They don;t supply this medication.  Pt aware and nothing further needed

## 2013-06-12 ENCOUNTER — Telehealth: Payer: Self-pay | Admitting: Internal Medicine

## 2013-06-12 NOTE — Telephone Encounter (Signed)
ATC - No voice mail service - Will try back

## 2013-06-13 NOTE — Telephone Encounter (Signed)
LMOMTCB x 1 

## 2013-06-14 ENCOUNTER — Telehealth: Payer: Self-pay | Admitting: Internal Medicine

## 2013-06-14 NOTE — Telephone Encounter (Signed)
Called spoke with pt. She wants labs results mailed to her. Confirmed mailing address. Labs palced in mail. Nothing further needed

## 2013-06-14 NOTE — Telephone Encounter (Signed)
Duplicate message. 

## 2013-06-14 NOTE — Telephone Encounter (Signed)
Pt is returning call.  She also wants a copy of her lab week from 2 weeks ago to take with her to doctor's appt she has on Tuesday.

## 2013-06-18 ENCOUNTER — Telehealth: Payer: Self-pay | Admitting: Internal Medicine

## 2013-06-18 NOTE — Telephone Encounter (Signed)
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  Pt was given Augmentin 875 mg and  Tobrami 25 mg nasal rinses to be used bid.  Pt still having sinus and teeth pressure.  Pt wants to know if Dr Annamaria Boots agrees with this treatment and if this if the right abx that she needs since she is still not better. Allergies  Allergen Reactions  . Augmentin [Amoxicillin-Pot Clavulanate] Diarrhea  . Avelox [Moxifloxacin Hcl In Nacl] Other (See Comments)    Tremors   . Biaxin [Clarithromycin]     Unsure of reaction  . Cedax [Ceftibuten] Other (See Comments)    tremors  . Clindamycin/Lincomycin     tachycardia  . Doxycycline Diarrhea  . Levofloxacin Other (See Comments)    Feels dehydrated  . Singulair [Montelukast Sodium] Other (See Comments)    Numbness and tingling in hand.  . Sulfonamide Derivatives Other (See Comments)    Gi upset

## 2013-06-18 NOTE — Telephone Encounter (Signed)
Called, spoke with pt.  Informed her of below per CDY.  She verbalized understanding and voiced no further questions or concerns at this time.  She is to call back if symptoms do not improve or worsen or is she has any further questions or concerns.

## 2013-06-18 NOTE — Assessment & Plan Note (Signed)
Reviewed anti reflux measures

## 2013-06-18 NOTE — Telephone Encounter (Signed)
Tobramycin in particular is usually a good antibiotic for pseudomonas infection. It is hard to get antibiotics into the sinuses, so it takes time. If she gets better on the antibiotics, that is the best indication we have that the pseudomonas was important and causing the face pain.

## 2013-06-18 NOTE — Assessment & Plan Note (Signed)
Doubt infection. This head pressure may be muscle tension, not related to sinues.

## 2013-06-18 NOTE — Assessment & Plan Note (Signed)
Plan- immunoglobulin levels because of her concern she may have weak immune system leading to frequent infections.

## 2013-06-18 NOTE — Assessment & Plan Note (Signed)
Plan- teach to sip, throat lozenges, avoid throat clearing. Depomedrol, refill perles.

## 2013-06-25 ENCOUNTER — Telehealth: Payer: Self-pay | Admitting: Internal Medicine

## 2013-06-25 NOTE — Telephone Encounter (Signed)
ATC, NA and VM not est yet

## 2013-06-26 ENCOUNTER — Ambulatory Visit: Payer: Medicare Other | Admitting: Internal Medicine

## 2013-06-26 NOTE — Telephone Encounter (Signed)
Advised pt that Cy rec's to r/s appointment for skin testing due to her being sick. Left message on Katie's desk to r/s appointment

## 2013-06-26 NOTE — Telephone Encounter (Signed)
Called and spoke with pt. She went to ENT and placed on nasal rinse called tobramine 25 mg BID. She has been getting up thick mucus. She is also taking amox 500 mg QD. Pt is due to have allergy testing today and wants to know if she needs to R/S please advise CDY thanks

## 2013-07-15 ENCOUNTER — Other Ambulatory Visit: Payer: Self-pay | Admitting: Family Medicine

## 2013-07-16 NOTE — Telephone Encounter (Signed)
Last seen 05/13/13  FPW  ICVSf approved route to nurse to call into

## 2013-07-19 NOTE — Telephone Encounter (Signed)
[facility]

## 2013-07-19 NOTE — Telephone Encounter (Signed)
Rx ready for nurse to Phone in. 

## 2013-07-25 ENCOUNTER — Telehealth: Payer: Self-pay | Admitting: Internal Medicine

## 2013-07-25 NOTE — Telephone Encounter (Signed)
The appt she has scheduled is for allergy testing  LMTCB

## 2013-07-25 NOTE — Telephone Encounter (Signed)
Pt returning call.Kristy Garza ° °

## 2013-07-25 NOTE — Telephone Encounter (Signed)
Called spoke w/ pt. Made her aware she will have skin testing done at next appt. Nothing further needed

## 2013-07-30 ENCOUNTER — Other Ambulatory Visit (INDEPENDENT_AMBULATORY_CARE_PROVIDER_SITE_OTHER): Payer: Medicare Other

## 2013-07-30 DIAGNOSIS — D649 Anemia, unspecified: Secondary | ICD-10-CM

## 2013-07-30 DIAGNOSIS — E039 Hypothyroidism, unspecified: Secondary | ICD-10-CM

## 2013-07-30 DIAGNOSIS — N951 Menopausal and female climacteric states: Secondary | ICD-10-CM

## 2013-07-30 DIAGNOSIS — R5381 Other malaise: Secondary | ICD-10-CM

## 2013-07-30 DIAGNOSIS — R5383 Other fatigue: Secondary | ICD-10-CM

## 2013-07-30 NOTE — Progress Notes (Signed)
Labs ordered by Dr Sheppard Coil T Augoustides

## 2013-07-31 LAB — CBC WITH DIFFERENTIAL
BASOS: 0 %
Basophils Absolute: 0 10*3/uL (ref 0.0–0.2)
Eos: 2 %
Eosinophils Absolute: 0.1 10*3/uL (ref 0.0–0.4)
HCT: 35 % (ref 34.0–46.6)
HEMOGLOBIN: 11.5 g/dL (ref 11.1–15.9)
Immature Grans (Abs): 0 10*3/uL (ref 0.0–0.1)
Immature Granulocytes: 0 %
LYMPHS ABS: 0.8 10*3/uL (ref 0.7–3.1)
LYMPHS: 19 %
MCH: 31.1 pg (ref 26.6–33.0)
MCHC: 32.9 g/dL (ref 31.5–35.7)
MCV: 95 fL (ref 79–97)
MONOCYTES: 8 %
Monocytes Absolute: 0.3 10*3/uL (ref 0.1–0.9)
NEUTROS ABS: 2.9 10*3/uL (ref 1.4–7.0)
Neutrophils Relative %: 71 %
Platelets: 231 10*3/uL (ref 150–379)
RBC: 3.7 x10E6/uL — ABNORMAL LOW (ref 3.77–5.28)
RDW: 12.7 % (ref 12.3–15.4)
WBC: 4.1 10*3/uL (ref 3.4–10.8)

## 2013-07-31 LAB — CMP14+EGFR
A/G RATIO: 2.2 (ref 1.1–2.5)
ALBUMIN: 4.6 g/dL (ref 3.6–4.8)
ALT: 26 IU/L (ref 0–32)
AST: 26 IU/L (ref 0–40)
Alkaline Phosphatase: 54 IU/L (ref 39–117)
BUN/Creatinine Ratio: 11 (ref 11–26)
BUN: 9 mg/dL (ref 8–27)
CO2: 25 mmol/L (ref 18–29)
Calcium: 9.9 mg/dL (ref 8.7–10.3)
Chloride: 99 mmol/L (ref 97–108)
Creatinine, Ser: 0.82 mg/dL (ref 0.57–1.00)
GFR calc Af Amer: 89 mL/min/{1.73_m2} (ref >59)
GFR, EST NON AFRICAN AMERICAN: 77 mL/min/{1.73_m2} (ref >59)
GLOBULIN, TOTAL: 2.1 g/dL (ref 1.5–4.5)
Glucose: 85 mg/dL (ref 65–99)
POTASSIUM: 4.2 mmol/L (ref 3.5–5.2)
Sodium: 140 mmol/L (ref 134–144)
TOTAL PROTEIN: 6.7 g/dL (ref 6.0–8.5)
Total Bilirubin: 0.4 mg/dL (ref 0.0–1.2)

## 2013-07-31 LAB — T4, FREE: Free T4: 1.42 ng/dL (ref 0.82–1.77)

## 2013-07-31 LAB — DHEA-SULFATE: DHEA-SO4: 16.6 ug/dL — ABNORMAL LOW (ref 29.4–220.5)

## 2013-07-31 LAB — VITAMIN D 25 HYDROXY (VIT D DEFICIENCY, FRACTURES): VIT D 25 HYDROXY: 36.2 ng/mL (ref 30.0–100.0)

## 2013-07-31 LAB — TSH: TSH: 0.666 u[IU]/mL (ref 0.450–4.500)

## 2013-07-31 LAB — THYROID PEROXIDASE ANTIBODY

## 2013-07-31 LAB — MAGNESIUM, RBC: Magnesium RBC: 4.7 mg/dL (ref 4.2–6.8)

## 2013-07-31 LAB — VITAMIN B12: Vitamin B-12: 760 pg/mL (ref 211–946)

## 2013-07-31 LAB — T3, FREE: T3, Free: 3.5 pg/mL (ref 2.0–4.4)

## 2013-07-31 LAB — FERRITIN: FERRITIN: 47 ng/mL (ref 15–150)

## 2013-07-31 LAB — CORTISOL-AM, BLOOD: CORTISOL - AM: 14.5 ug/dL (ref 6.2–19.4)

## 2013-08-07 ENCOUNTER — Ambulatory Visit: Payer: Medicare Other | Admitting: Internal Medicine

## 2013-08-07 ENCOUNTER — Telehealth: Payer: Self-pay | Admitting: Family Medicine

## 2013-08-07 DIAGNOSIS — M810 Age-related osteoporosis without current pathological fracture: Secondary | ICD-10-CM

## 2013-08-07 NOTE — Telephone Encounter (Signed)
Take glycolax or miralax otc and it not working follow up

## 2013-08-08 ENCOUNTER — Ambulatory Visit (INDEPENDENT_AMBULATORY_CARE_PROVIDER_SITE_OTHER): Payer: Medicare Other | Admitting: Family

## 2013-08-08 ENCOUNTER — Encounter: Payer: Self-pay | Admitting: Family

## 2013-08-08 VITALS — BP 110/78 | HR 70 | Temp 97.5°F | Ht 69.0 in | Wt 107.4 lb

## 2013-08-08 DIAGNOSIS — K5900 Constipation, unspecified: Secondary | ICD-10-CM

## 2013-08-08 DIAGNOSIS — B351 Tinea unguium: Secondary | ICD-10-CM

## 2013-08-08 DIAGNOSIS — B370 Candidal stomatitis: Secondary | ICD-10-CM

## 2013-08-08 MED ORDER — TERBINAFINE HCL 250 MG PO TABS: 250.0000 mg | ORAL_TABLET | Freq: Every day | ORAL | Status: DC

## 2013-08-08 MED ORDER — LINACLOTIDE 290 MCG PO CAPS: 290.0000 ug | ORAL_CAPSULE | Freq: Every day | ORAL | Status: DC

## 2013-08-08 MED ORDER — CLOTRIMAZOLE 10 MG MT TROC: 10.0000 mg | Freq: Every day | OROMUCOSAL | Status: DC

## 2013-08-08 NOTE — Progress Notes (Signed)
   Subjective:    Patient ID: Kristy Garza, female    DOB: May 25, 1950, 63 y.o.   MRN: [id number]  Constipation This is a recurrent problem. The current episode started in the past 7 days. The problem has been resolved since onset. Her stool frequency is 1 time per day. The stool is described as firm and formed. The patient is not on a high fiber diet. She does not exercise regularly. There has been adequate water intake. Associated symptoms include bloating. Pertinent negatives include no flatus, nausea or vomiting. She has tried laxatives and enemas for the symptoms. The treatment provided moderate relief.   *Pt also has thrush in mouth and a toenail fungus she would like a rx for.   Review of Systems  HENT: Positive for sinus pressure.   Respiratory: Positive for cough.   Cardiovascular: Negative.   Gastrointestinal: Positive for constipation and bloating. Negative for nausea, vomiting and flatus.  Genitourinary: Negative.   Hematological: Negative.   All other systems reviewed and are negative.      Objective:   Physical Exam  Vitals reviewed. Constitutional: She is oriented to person, place, and time. She appears well-developed and well-nourished. No distress.  HENT:  Head: Normocephalic.  Right Ear: External ear normal.  Left Ear: External ear normal.  Oropharynx erythemas, tongue has a white coating on top   Neck: Normal range of motion. Neck supple. No thyromegaly present.  Cardiovascular: Normal rate, regular rhythm, normal heart sounds and intact distal pulses.   No murmur heard. Pulmonary/Chest: Effort normal and breath sounds normal. No respiratory distress. She has no wheezes.  Constant dry cough   Abdominal: Soft. Bowel sounds are normal. She exhibits no distension. There is no tenderness.  Musculoskeletal: Normal range of motion. She exhibits no edema and no tenderness.  Neurological: She is alert and oriented to person, place, and time.  Skin: Skin is warm and  dry.  Right great toenail has a fungal infection   Psychiatric: She has a normal mood and affect. Her behavior is normal. Judgment and thought content normal.    BP 110/78  Pulse 70  Temp(Src) 97.5 F (36.4 C) (Oral)  Ht 5\' 9"  (1.753 m)  Wt 107 lb 6.4 oz (48.716 kg)  BMI 15.85 kg/m2       Assessment & Plan:  1. Constipation -High fiber diet - Linaclotide (LINZESS) 290 MCG CAPS capsule; Take 1 capsule (290 mcg total) by mouth daily.  Dispense: 30 capsule; Refill: 3  2. Oral thrush -Rinse mouth in between meals - clotrimazole (MYCELEX) 10 MG troche; Take 1 tablet (10 mg total) by mouth 5 (five) times daily.  Dispense: 70 tablet; Refill: 1  3. Fungal infection of toenail -Keep toenails clean and dry - terbinafine (LAMISIL) 250 MG tablet; Take 1 tablet (250 mg total) by mouth daily.  Dispense: 42 tablet; Refill: 1  RTO prn Evelina Dun, FNP

## 2013-08-08 NOTE — Telephone Encounter (Signed)
Patient said she cant take stuff otc it dosent work for her gave her an appointment with [facility] [employer] [facility]

## 2013-08-08 NOTE — Patient Instructions (Signed)

## 2013-08-13 ENCOUNTER — Telehealth: Payer: Self-pay | Admitting: Internal Medicine

## 2013-08-13 MED ORDER — CEFDINIR 300 MG PO CAPS: 300.0000 mg | ORAL_CAPSULE | Freq: Two times a day (BID) | ORAL | Status: DC

## 2013-08-13 NOTE — Telephone Encounter (Signed)
Called spoke with pt. Aware of recs. Nothing further needed. RX sent in.

## 2013-08-13 NOTE — Telephone Encounter (Signed)
Spoke with the pt  She is c/o sinus pressure, teeth pain, increased cough-non prod, and nasal congestion x 3 days  She denies a fever or other co's She states that she is using astelin and her netti pot, and does not think these are working well  Wants additional recs  Please advise thanks! Last ov 05/30/13  Next ov 08/28/13 for allergy skin test  Allergies  Allergen Reactions  . Augmentin [Amoxicillin-Pot Clavulanate] Diarrhea  . Avelox [Moxifloxacin Hcl In Nacl] Other (See Comments)    Tremors   . Biaxin [Clarithromycin]     Unsure of reaction  . Cedax [Ceftibuten] Other (See Comments)    tremors  . Clindamycin/Lincomycin     tachycardia  . Doxycycline Diarrhea  . Levofloxacin Other (See Comments)    Feels dehydrated  . Singulair [Montelukast Sodium] Other (See Comments)    Numbness and tingling in hand.  . Sulfonamide Derivatives Other (See Comments)    Gi upset   Current Outpatient Prescriptions on File Prior to Visit  Medication Sig Dispense Refill  . Acrivastine-Pseudoephedrine (SEMPREX-D) 8-60 MG CAPS Take 1 capsule by mouth daily.  10 each  0  . albuterol (PROVENTIL HFA;VENTOLIN HFA) 108 (90 BASE) MCG/ACT inhaler Inhale 2 puffs into the lungs every 6 (six) hours as needed for wheezing or shortness of breath.      [name] albuterol (PROVENTIL) (2.5 MG/3ML) 0.083% nebulizer solution Take 3 mLs (2.5 mg total) by nebulization every 6 (six) hours as needed for wheezing or shortness of breath.  150 mL  1  . ALPRAZolam (XANAX) 0.5 MG tablet TAKE 1 TABLET BY MOUTH THREE TIMES DAILY AS NEEDED FOR ANXIETY  50 tablet  0  . azelastine (ASTELIN) 0.1 % nasal spray Place 1 spray into both nostrils 2 (two) times daily. Use in each nostril as directed      . beclomethasone (QVAR) 40 MCG/ACT inhaler Inhale 2 puffs into the lungs 2 (two) times daily.  1 Inhaler  1  . benzonatate (TESSALON) 200 MG capsule TAKE 1-2 CAPSULES (100-200 MG TOTAL) BY MOUTH 3 (THREE) TIMES DAILY AS NEEDED FOR COUGH.  40  capsule  2  . Calcium Glycerophosphate (PRELIEF) 340 (65-50) MG (CA-P) TABS Take 1 capsule by mouth 3 (three) times daily before meals.  90 each  11  . Calcium-Magnesium-Vitamin D (CITRACAL CALCIUM+D PO) Take 1 tablet by mouth 2 (two) times daily.       . clotrimazole (MYCELEX) 10 MG troche Take 1 tablet (10 mg total) by mouth 5 (five) times daily.  70 tablet  1  . denosumab (PROLIA) 60 MG/ML SOLN injection Inject 60 mg into the skin every 6 (six) months. Administer in upper arm, thigh, or abdomen      . famotidine (PEPCID) 20 MG tablet       . fluticasone (FLONASE) 50 MCG/ACT nasal spray Place 1 spray into both nostrils daily.      [name] gabapentin (NEURONTIN) 100 MG capsule Take 100 mg by mouth at bedtime.      [name] guaiFENesin (MUCINEX) 600 MG 12 hr tablet Take 600 mg by mouth 2 (two) times daily.      [name] lidocaine (XYLOCAINE) 2 % jelly Apply topically as needed.      . Linaclotide (LINZESS) 290 MCG CAPS capsule Take 1 capsule (290 mcg total) by mouth daily.  30 capsule  3  . mometasone (NASONEX) 50 MCG/ACT nasal spray Place 2 sprays into the nose daily.      [name] nystatin (MYCOSTATIN)  100000 UNIT/ML suspension Take 5 mLs (500,000 Units total) by mouth 4 (four) times daily. Swish and swallow  150 mL  0  . pantoprazole (PROTONIX) 40 MG tablet Take 40 mg by mouth daily.      . pentosan polysulfate (ELMIRON) 100 MG capsule Take 1 capsule (100 mg total) by mouth 3 (three) times daily before meals.  90 capsule  6  . Probiotic Product (ALIGN) 4 MG CAPS Take 1 capsule by mouth daily.      . Quercetin (QUERCITIN) POWD by Does not apply route.      . terbinafine (LAMISIL) 250 MG tablet Take 1 tablet (250 mg total) by mouth daily.  42 tablet  1  . tobramycin (NEBCIN) 1.2 G injection        Current Facility-Administered Medications on File Prior to Visit  Medication Dose Route Frequency Provider Last Rate Last Dose  . bupivacaine (MARCAINE) 0.5 % 10 mL, triamcinolone acetonide (KENALOG-40) 40 mg injection    Subcutaneous Once Ailene Rud, MD      . bupivacaine (MARCAINE) 0.5 % 15 mL, phenazopyridine (PYRIDIUM) 400 mg bladder mixture   Bladder Instillation Once Ailene Rud, MD

## 2013-08-13 NOTE — Telephone Encounter (Signed)
Offer cefdinir 300 mg, # 14, 1 twice daily 

## 2013-08-14 ENCOUNTER — Ambulatory Visit: Payer: Medicare Other | Admitting: Internal Medicine

## 2013-08-15 ENCOUNTER — Telehealth: Payer: Self-pay | Admitting: Family Medicine

## 2013-08-15 NOTE — Telephone Encounter (Signed)
NTBS foir evaluation and blood work first

## 2013-08-15 NOTE — Telephone Encounter (Signed)
B-12 lab results were in normal range. You can purchase pills OTC if you would like to increase your normal range.  Purchase Boost or other weight gain products to supplement your diet. Eating more and drinking boost should help with your weakness.

## 2013-08-28 ENCOUNTER — Ambulatory Visit (INDEPENDENT_AMBULATORY_CARE_PROVIDER_SITE_OTHER): Payer: Medicare Other | Admitting: Internal Medicine

## 2013-08-28 ENCOUNTER — Encounter: Payer: Self-pay | Admitting: Internal Medicine

## 2013-08-28 VITALS — BP 106/54 | HR 74 | Ht 69.0 in | Wt 109.6 lb

## 2013-08-28 DIAGNOSIS — J45909 Unspecified asthma, uncomplicated: Secondary | ICD-10-CM

## 2013-08-28 DIAGNOSIS — J309 Allergic rhinitis, unspecified: Secondary | ICD-10-CM

## 2013-08-28 DIAGNOSIS — J302 Other seasonal allergic rhinitis: Secondary | ICD-10-CM

## 2013-08-28 DIAGNOSIS — G4763 Sleep related bruxism: Secondary | ICD-10-CM

## 2013-08-28 DIAGNOSIS — F411 Generalized anxiety disorder: Secondary | ICD-10-CM

## 2013-08-28 DIAGNOSIS — J310 Chronic rhinitis: Secondary | ICD-10-CM

## 2013-08-28 DIAGNOSIS — J440 Chronic obstructive pulmonary disease with acute lower respiratory infection: Secondary | ICD-10-CM

## 2013-08-28 NOTE — Patient Instructions (Signed)
Call us if you decide that you do want to start allergy shots. The allergy lab will then call you about getting started.

## 2013-08-28 NOTE — Progress Notes (Signed)
12/28/12- 41 yoF never smoker-Dr Wong/ [facility]-last seen by [facility] 2009; seen at [facility] chest 2002; chronic cough  Has seen Dr. Lamonte Sakai here and Dr Neldon Mc in the past. Complains of allergies, asthma, chronic cough for over 20 years. Help with inhalers and cough medicines. Using rescue inhaler once or twice a day. Not using Qvar now. Particularly bad in spring and fall. [place] smells musty when it rains. She thinks in retrospect that allergy shots in the past with more than she realized. She had one episode while on allergy shots, when she had throat tightness which scared her so she quit.. Occasional cough at night. Previous PFTs are filed, but as recently as 2009 showed reversible obstructive airways disease and small airways. Singulair blamed for paresthesias. Son smokes outside the home. History of sinus surgery, vocal cord polyp. Father had allergies and asthma  02/07/13- 62 yoF never smoker-Dr Wong/ [facility]-last seen by [facility] 2009; seen at [facility] chest 2002; followed for chronic cough, allergic rhinitis  ACUTE VISIT:  Cough worsening over the past month.  Cough dry-persistant  cough worse in the past week. Losing weight attributed to poor appetite which she blames on acid indigestion despite pantoprazole/ elevated HOB. She's worried the chest x-ray suggests emphysema. Anoro blamed for chest pain. CXR 01/30/13 CLINICAL DATA: Cough.  EXAM:  CHEST 2 VIEW  COMPARISON: Chest radiograph 05/15/2012.  FINDINGS:  The cardiopericardial silhouette and mediastinal contours are within  normal limits. Hyperinflation and severe emphysema is present. No  airspace disease or pleural effusion is identified.  IMPRESSION:  Emphysema without acute cardiopulmonary disease.  Electronically Signed  By: Dereck Ligas M.D.  On: 01/30/2013 13:00  03/15/13- 62 yoF never smoker-Dr Wong/ [facility]-last seen by [facility] 2009; seen at [facility] chest 2002; followed for chronic cough, allergic rhinitis  ACUTE VISIT: Cough since 01/2013.  Saw her PCP and was given prednisone with no relief. Reports cough, ear pain, chest tightness and congestion. Had outpatient bladder surgery and has been coughing up blood since then. Sinus surgery summer 2014. Now 5 days of increased head congestion. New hemoptysis, daily, since bladder surgery 5 days ago during which she was intubated. Now on cephalexin for UTI. Notices frontal headache, ears full. Treating leg cramps with bananas and mustard a1AT level 02/07/13- WNL  MM 167. CXR 01/30/13 IMPRESSION:  Emphysema without acute cardiopulmonary disease.  Electronically Signed  By: Dereck Ligas M.D.  On: 01/30/2013 13:00  03/29/13- 62 yoF never smoker (father smoked)- followed for chronic cough, allergic rhinitis , COPD by CT Pt c/o sinus pressure, headaches, ear pain, prod cough with sometimes clear mucous  She had prednisone in early Dec for cough, then reported streak hemoptysis and had CT. Has finished Keflex and prednisone with no more hemoptysis Admits head and sinus pressure. Taking Sudafed.  Has followup appointment with ENT in [place]. Allergy profile 12/28/2012 negative with total IgE 4.5. a1AT 02/07/13- WNL MM, 167 CT chest 03/25/13 IMPRESSION:  1. No CT evidence of acute pulmonary embolism.  2. Emphysema. No acute cardiopulmonary process identified.  Electronically Signed  By: Jeannine Boga M.D.  On: 03/25/2013 13:31  05/02/13- 85 yoF never smoker (father smoked)- followed for chronic cough, allergic rhinitis , COPD by CT No antihistmines, OTC cough syrups, or sleep aids in past 3 days; Pt having continues to have chest congestion, cough,"lump in throat feeling", sore throat as well. Husband is currently sick as well; wonders if she should have test today. ENT in [facility] gave augmentin 875, 3x daily> diarrhea  after 4 doses, but she thought it had started to help. He is scheduling CT sinus.  She bought colloidal silver preparation at [employer] [facility], but her  accupuncturist told her not to take it. Friend recommended alternative practitioner who gives Vit C infusions.  Main c/o is globus sensation with cough, maxillary facial pressure. Husband is sick. We are deferring allergy testing.  CT maxfac 05/13/2011 IMPRESSION:  No paranasal sinuses mucosal thickening or air-fluid levels.  Midline nasal septum. Question prior surgery bilateral medial wall  maxillary sinus and right ethmoid air cells.  Original Report Authenticated By: Lahoma Crocker, M.D. QuickVue Flu swab 25/15- NEG  05/30/13- 86 yoF never smoker (father smoked)- followed for chronic cough, allergic rhinitis , COPD by CT FOLLOWS FOR:  c/o: sinus pressure and cough with clear thick mucus and ringing in ears still. Raspy throat, but much better than before. Taking cefdinir 300 mg once daily for tolerance- 3 more days. ENT said sinuses ok at [facility]. Asks depo- prefers over prednisone.  08/28/13- 77 yoF never smoker (father smoked)- followed for chronic cough, allergic rhinitis , COPD by CT(Nl a1AT) No antihistamines, OTC sleep aids in past 3 days. Pt had 2 teaspoons of Tussin DM at 11:00am this morning. Complains of facial pain, sensitive upper teeth. Told by ENT sinuses good, but Rx'd nasal mometasone and tobra. Dentist says tooth pain not from sinuses. Integrative medicine doctor in [place] did some Immun[facility] tests but she isn't going to follow up there.Continues Qvar. 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 For allergy skin testing 08/28/13-Positive- grass, tree, weed, dust. She will decide about allergy shots. Dr Laurance Flatten could give once she is at maintenance.  ROS-see HPI Constitutional:   No-   weight loss, night sweats, fevers, chills, fatigue, lassitude. HEENT:   + headaches, no-difficulty swallowing, +tooth/dental problems, +sore throat,       No-  sneezing, itching, ear ache, +nasal congestion, post nasal drip,  CV:  No-   chest pain, orthopnea, PND, swelling in lower extremities, anasarca, dizziness,  palpitations Resp: No-   shortness of breath with exertion or at rest.              No-   productive cough,  + non-productive cough,  No- coughing up of blood.              No-   change in color of mucus.  No- wheezing.   Skin: No-   rash or lesions. GI:   No-heartburn, indigestion,  loss of appetite GU: . MS:  No-   joint pain or swelling.   Neuro-     nothing unusual Psych:  No- change in mood or affect. No depression or anxiety.  No memory loss.  OBJ- Physical Exam General- Alert, Oriented, Affect-appropriate, Distress- none acute, Trim. Sits hunched but can straighten. Skin- rash-none, lesions- none, excoriation- none Lymphadenopathy- none Head- atraumatic            Eyes- Gross vision intact, PERRLA, conjunctivae and secretions clear            Ears- Hearing, canals-normal            Nose- Clear, no-Septal dev, mucus, polyps, erosion, perforation             Throat- Mallampati II , mucosa clear , drainage- none, tonsils- atrophic.  +Much dental                          repair Neck- flexible , trachea midline, no  stridor , thyroid nl, carotid no bruit Chest - symmetrical excursion , unlabored           Heart/CV- RRR , no murmur , no gallop  , no rub, nl s1 s2                           - JVD- none , edema- none, stasis changes- none, varices- none           Lung- clear to P&A, wheeze- none, cough-none , dullness-none, rub- none. Long/thin                           thorax.           Chest wall-  Abd-  Br/ Gen/ Rectal- Not done, not indicated Extrem- cyanosis- none, clubbing, none, atrophy- none, strength- nl Neuro- grossly intact to observation

## 2013-08-28 NOTE — Assessment & Plan Note (Signed)
Badly worn teeth, much dental repair, chronic facial pain. 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 Plan- otc bruxism guard and talk to dentist.

## 2013-08-29 ENCOUNTER — Other Ambulatory Visit: Payer: Self-pay

## 2013-08-29 NOTE — Telephone Encounter (Signed)
Patient has decided to get prolia - will get labs and then refer to [facility].

## 2013-08-29 NOTE — Assessment & Plan Note (Signed)
Possibly a connection to her bruxism

## 2013-08-29 NOTE — Assessment & Plan Note (Signed)
She would be a reasonable candidate for allergy vaccine if she is willing to drive here for her shots. Emphasis on environmental precautions, symptomatic therapy. Also explained potential for facial symptoms from her bruxism. Plan- She will call us if she decides she wants to start.

## 2013-08-30 ENCOUNTER — Other Ambulatory Visit: Payer: Medicare Other

## 2013-08-30 DIAGNOSIS — M810 Age-related osteoporosis without current pathological fracture: Secondary | ICD-10-CM

## 2013-08-31 ENCOUNTER — Telehealth: Payer: Self-pay | Admitting: Family Medicine

## 2013-08-31 LAB — BMP8+EGFR
BUN/Creatinine Ratio: 17 (ref 11–26)
BUN: 12 mg/dL (ref 8–27)
CO2: 25 mmol/L (ref 18–29)
Calcium: 10.4 mg/dL — ABNORMAL HIGH (ref 8.7–10.3)
Chloride: 99 mmol/L (ref 97–108)
Creatinine, Ser: 0.7 mg/dL (ref 0.57–1.00)
GFR, EST AFRICAN AMERICAN: 107 mL/min/{1.73_m2} (ref >59)
GFR, EST NON AFRICAN AMERICAN: 93 mL/min/{1.73_m2} (ref >59)
Glucose: 68 mg/dL (ref 65–99)
POTASSIUM: 4 mmol/L (ref 3.5–5.2)
SODIUM: 142 mmol/L (ref 134–144)

## 2013-08-31 LAB — PHOSPHORUS: Phosphorus: 3.4 mg/dL (ref 2.5–4.5)

## 2013-08-31 LAB — MAGNESIUM: Magnesium: 1.9 mg/dL (ref 1.6–2.6)

## 2013-08-31 NOTE — Telephone Encounter (Signed)
Pt called to inform she is having sinus pressure and headache Informed pt that she can try OTC decongestant, Motrin or Tylenol Verbalizes understanding

## 2013-09-02 ENCOUNTER — Telehealth: Payer: Self-pay | Admitting: Pharmacist

## 2013-09-02 NOTE — Telephone Encounter (Signed)
Results from lab 08/30/13 were all normal except slightly elevated calcium - OK to restart Prolia.  Will sent paperwork / referral to [place] Patient called - left message.

## 2013-09-03 ENCOUNTER — Ambulatory Visit: Payer: Medicare Other | Admitting: Internal Medicine

## 2013-09-09 ENCOUNTER — Telehealth: Payer: Self-pay | Admitting: Family Medicine

## 2013-09-10 NOTE — Telephone Encounter (Signed)
Tammy, you respond to this patient for me?

## 2013-09-11 NOTE — Telephone Encounter (Signed)
I would need to see the lab results she is referring to - DHEA or DHEAS? Where did she get this lab done and was it serum or urine?   Called patient to see if she could bring a copy of labs into office.  No answer - left message.

## 2013-09-12 ENCOUNTER — Telehealth: Payer: Self-pay | Admitting: Internal Medicine

## 2013-09-12 ENCOUNTER — Ambulatory Visit (INDEPENDENT_AMBULATORY_CARE_PROVIDER_SITE_OTHER): Payer: Medicare Other | Admitting: Physician Assistant

## 2013-09-12 ENCOUNTER — Encounter: Payer: Self-pay | Admitting: Physician Assistant

## 2013-09-12 VITALS — BP 98/64 | HR 90 | Temp 98.7°F | Ht 69.0 in | Wt 108.2 lb

## 2013-09-12 DIAGNOSIS — J0100 Acute maxillary sinusitis, unspecified: Secondary | ICD-10-CM

## 2013-09-12 MED ORDER — DOXYCYCLINE HYCLATE 100 MG PO TABS: 100.0000 mg | ORAL_TABLET | Freq: Two times a day (BID) | ORAL | Status: DC

## 2013-09-12 NOTE — Progress Notes (Signed)
Subjective:     Patient ID: Kristy Garza, female   DOB: Aug 05, 1950, 63 y.o.   MRN: [id number]  HPI Pt here with sinus pain and pressure for several weeks She has taken her regular allergy meds Pt also has appt with her pulm   Review of Systems  Constitutional: Positive for activity change and fatigue.  HENT: Positive for congestion, ear pain, postnasal drip and sinus pressure. Negative for sneezing and sore throat.   Respiratory: Positive for cough.        Objective:   Physical Exam  Constitutional: She appears well-developed and well-nourished.  HENT:  Right Ear: External ear normal.  Left Ear: External ear normal.  Mouth/Throat: Oropharynx is clear and moist.  + TTP maxillary and frontal sinus  Neck: Neck supple. No thyromegaly present.  Cardiovascular: Normal rate, regular rhythm and normal heart sounds.   Pulmonary/Chest: Effort normal and breath sounds normal.  Lymphadenopathy:    She has no cervical adenopathy.       Assessment:     Sinusitis    Plan:     Fluids Cont other meds Keep f/u appt with allergist State she is able to tolerate Doxycyline so rx done today Sun precaut given

## 2013-09-12 NOTE — Telephone Encounter (Signed)
Starting allergy vaccine here. After she completes build-up here, we may be able to have Dr [facility] office give her shots in [place]. She will need to talk with Vallarie Mare and her insurance about the cost to her. Don't make vaccine until patient has checked on cost and confirms she wants to go ahead.

## 2013-09-12 NOTE — Telephone Encounter (Signed)
I called spoke with pt. C/o ear pressure (at times feel hot and painful), upper teeth pain, HA, dry cough (at times brings clear phlem). She does sinus rinse and mucus is clear. She was clearing her throat frequently on the phone. Her face feels very tight. She is taking mucinex, astelin, sinus rinse. Pt wants an appt with Dr. Annamaria Boots today. Thanks  Allergies  Allergen Reactions  . Augmentin [Amoxicillin-Pot Clavulanate] Diarrhea  . Avelox [Moxifloxacin Hcl In Nacl] Other (See Comments)    Tremors   . Biaxin [Clarithromycin]     Unsure of reaction  . Cedax [Ceftibuten] Other (See Comments)    tremors  . Clindamycin/Lincomycin     tachycardia  . Doxycycline Diarrhea  . Gabapentin     Constipation  . Levofloxacin Other (See Comments)    Feels dehydrated  . Singulair [Montelukast Sodium] Other (See Comments)    Numbness and tingling in hand.  . Sulfonamide Derivatives Other (See Comments)    Gi upset     Current Outpatient Prescriptions on File Prior to Visit  Medication Sig Dispense Refill  . albuterol (PROVENTIL HFA;VENTOLIN HFA) 108 (90 BASE) MCG/ACT inhaler Inhale 2 puffs into the lungs every 6 (six) hours as needed for wheezing or shortness of breath.      [name] albuterol (PROVENTIL) (2.5 MG/3ML) 0.083% nebulizer solution Take 3 mLs (2.5 mg total) by nebulization every 6 (six) hours as needed for wheezing or shortness of breath.  150 mL  1  . ALPRAZolam (XANAX) 0.5 MG tablet TAKE 1 TABLET BY MOUTH THREE TIMES DAILY AS NEEDED FOR ANXIETY  50 tablet  0  . azelastine (ASTELIN) 0.1 % nasal spray Place 1 spray into both nostrils 2 (two) times daily. Use in each nostril as directed      . beclomethasone (QVAR) 40 MCG/ACT inhaler Inhale 2 puffs into the lungs 2 (two) times daily.  1 Inhaler  1  . benzonatate (TESSALON) 200 MG capsule TAKE 1-2 CAPSULES (100-200 MG TOTAL) BY MOUTH 3 (THREE) TIMES DAILY AS NEEDED FOR COUGH.  40 capsule  2  . Calcium Glycerophosphate (PRELIEF) 340 (65-50) MG (CA-P)  TABS Take 1 capsule by mouth 3 (three) times daily before meals.  90 each  11  . Calcium-Magnesium-Vitamin D (CITRACAL CALCIUM+D PO) Take 1 tablet by mouth 2 (two) times daily.       [name] denosumab (PROLIA) 60 MG/ML SOLN injection Inject 60 mg into the skin every 6 (six) months. Administer in upper arm, thigh, or abdomen      . famotidine (PEPCID) 20 MG tablet       . fluticasone (FLONASE) 50 MCG/ACT nasal spray Place 1 spray into both nostrils daily.      [name] guaiFENesin (MUCINEX) 600 MG 12 hr tablet Take 600 mg by mouth 2 (two) times daily.      [name] lidocaine (XYLOCAINE) 2 % jelly Apply topically as needed.      . Linaclotide (LINZESS) 290 MCG CAPS capsule Take 1 capsule (290 mcg total) by mouth daily.  30 capsule  3  . mometasone (ELOCON) 0.1 % lotion       . Omega-3 Fatty Acids (FISH OIL) 1000 MG CAPS Take 1 capsule by mouth daily.      . pantoprazole (PROTONIX) 40 MG tablet Take 40 mg by mouth daily.      . pentosan polysulfate (ELMIRON) 100 MG capsule Take 1 capsule (100 mg total) by mouth 3 (three) times daily before meals.  90 capsule  6  .  Probiotic Product (ALIGN) 4 MG CAPS Take 1 capsule by mouth daily.      . Quercetin (QUERCITIN) POWD by Does not apply route.       Current Facility-Administered Medications on File Prior to Visit  Medication Dose Route Frequency Provider Last Rate Last Dose  . bupivacaine (MARCAINE) 0.5 % 10 mL, triamcinolone acetonide (KENALOG-40) 40 mg injection   Subcutaneous Once Ailene Rud, MD      . bupivacaine (MARCAINE) 0.5 % 15 mL, phenazopyridine (PYRIDIUM) 400 mg bladder mixture   Bladder Instillation Once Ailene Rud, MD

## 2013-09-12 NOTE — Telephone Encounter (Signed)
I called Mrs.Bumgarner lmom giving her Dr.Young's advise and asked her to call me back once she finds out the cost. So I'll know wether or not to make up her vac.[name]

## 2013-09-12 NOTE — Telephone Encounter (Signed)
Spoke with Kristy Garza-she states she is being seen at her PCP right now and no longer needs appt with [name]. However patient would like to start on allergy vaccine and questions the cost. I explained to her that I would let CY know that she wants to start vaccine and go from there.

## 2013-09-12 NOTE — Patient Instructions (Signed)
Sinusitis Sinusitis is redness, soreness, and swelling (inflammation) of the paranasal sinuses. Paranasal sinuses are air pockets within the bones of your face (beneath the eyes, the middle of the forehead, or above the eyes). In healthy paranasal sinuses, mucus is able to drain out, and air is able to circulate through them by way of your nose. However, when your paranasal sinuses are inflamed, mucus and air can become trapped. This can allow bacteria and other germs to grow and cause infection. Sinusitis can develop quickly and last only a short time (acute) or continue over a long period (chronic). Sinusitis that lasts for more than 12 weeks is considered chronic.  CAUSES  Causes of sinusitis include:  Allergies.  Structural abnormalities, such as displacement of the cartilage that separates your nostrils (deviated septum), which can decrease the air flow through your nose and sinuses and affect sinus drainage.  Functional abnormalities, such as when the small hairs (cilia) that line your sinuses and help remove mucus do not work properly or are not present. SYMPTOMS  Symptoms of acute and chronic sinusitis are the same. The primary symptoms are pain and pressure around the affected sinuses. Other symptoms include:  Upper toothache.  Earache.  Headache.  Bad breath.  Decreased sense of smell and taste.  A cough, which worsens when you are lying flat.  Fatigue.  Fever.  Thick drainage from your nose, which often is green and may contain pus (purulent).  Swelling and warmth over the affected sinuses. DIAGNOSIS  Your caregiver will perform a physical exam. During the exam, your caregiver may:  Look in your nose for signs of abnormal growths in your nostrils (nasal polyps).  Tap over the affected sinus to check for signs of infection.  View the inside of your sinuses (endoscopy) with a special imaging device with a light attached (endoscope), which is inserted into your  sinuses. If your caregiver suspects that you have chronic sinusitis, one or more of the following tests may be recommended:  Allergy tests.  Nasal culture--A sample of mucus is taken from your nose and sent to a lab and screened for bacteria.  Nasal cytology--A sample of mucus is taken from your nose and examined by your caregiver to determine if your sinusitis is related to an allergy. TREATMENT  Most cases of acute sinusitis are related to a viral infection and will resolve on their own within 10 days. Sometimes medicines are prescribed to help relieve symptoms (pain medicine, decongestants, nasal steroid sprays, or saline sprays).  However, for sinusitis related to a bacterial infection, your caregiver will prescribe antibiotic medicines. These are medicines that will help kill the bacteria causing the infection.  Rarely, sinusitis is caused by a fungal infection. In theses cases, your caregiver will prescribe antifungal medicine. For some cases of chronic sinusitis, surgery is needed. Generally, these are cases in which sinusitis recurs more than 3 times per year, despite other treatments. HOME CARE INSTRUCTIONS   Drink plenty of water. Water helps thin the mucus so your sinuses can drain more easily.  Use a humidifier.  Inhale steam 3 to 4 times a day (for example, sit in the bathroom with the shower running).  Apply a warm, moist washcloth to your face 3 to 4 times a day, or as directed by your caregiver.  Use saline nasal sprays to help moisten and clean your sinuses.  Take over-the-counter or prescription medicines for pain, discomfort, or fever only as directed by your caregiver. SEEK IMMEDIATE MEDICAL CARE IF:    You have increasing pain or severe headaches.  You have nausea, vomiting, or drowsiness.  You have swelling around your face.  You have vision problems.  You have a stiff neck.  You have difficulty breathing. MAKE SURE YOU:   Understand these  instructions.  Will watch your condition.  Will get help right away if you are not doing well or get worse. Document Released: 03/14/2005 Document Revised: 06/06/2011 Document Reviewed: 03/29/2011 ExitCare Patient Information 2015 [employer]. This information is not intended to replace advice given to you by your health care provider. Make sure you discuss any questions you have with your health care provider.  

## 2013-09-12 NOTE — Telephone Encounter (Signed)
I have looked at labs but I am not sure what instigated testing DHEA.  These labs were ordered by Dr Sheppard Coil T Augrustides.  I called Dr> Augrustides office and spoke with Colletta [place].  She states that Dr Marjory Sneddon has addressed lab results.  He ordered DHEA 1mg  daily - rx sent to [facility] (probably because this is a compound) and he also advised her to take vitamin D3 5000iu mondays, wednesdays and fridays.  Left message on patients VM to follow Dr Marjory Sneddon' instructions.

## 2013-09-12 NOTE — Telephone Encounter (Signed)
Attempted to x1 LMTCB

## 2013-09-14 ENCOUNTER — Telehealth: Payer: Self-pay | Admitting: Physician Assistant

## 2013-09-16 NOTE — Telephone Encounter (Signed)
Spoke to provider she said to take any decongestant OTC patient aware

## 2013-09-17 ENCOUNTER — Telehealth: Payer: Self-pay | Admitting: Internal Medicine

## 2013-09-17 NOTE — Telephone Encounter (Signed)
LMTCB

## 2013-09-17 NOTE — Telephone Encounter (Signed)
Kristy Garza will be responsible for 20% of the cost of the shots and her vac. She is ok with this. Now I am waiting to hear from her to see if a Dr. in [place] which is closer to her is willing to to administer the build up vials. Since Dr.Moore wasn't. It's so far to come here twice a for 3 and 1/2 wks.. I did tell her you have authorized pts. To come in once a wk in the past.

## 2013-09-17 NOTE — Telephone Encounter (Signed)
Per CY-patient can try Sudafed PE or Mucinex D. Thanks.

## 2013-09-17 NOTE — Telephone Encounter (Signed)
Spoke with the pt  She is c/o HA, sinus pressure, ear pressure, and non prod cough (minimal) x 3 wks  She states that she was seen by her PCP on 09/12/13 and was given doxy which she is still taking She states that she is not improving, and is going to see her ENT this Thurs She is asking for advise from [place] in the meantime on what she should take to relieve some pressure  Please advise, thanks! Allergies  Allergen Reactions  . Augmentin [Amoxicillin-Pot Clavulanate] Diarrhea  . Avelox [Moxifloxacin Hcl In Nacl] Other (See Comments)    Tremors   . Biaxin [Clarithromycin]     Unsure of reaction  . Cedax [Ceftibuten] Other (See Comments)    tremors  . Clindamycin/Lincomycin     tachycardia  . Doxycycline Diarrhea  . Gabapentin     Constipation  . Levofloxacin Other (See Comments)    Feels dehydrated  . Singulair [Montelukast Sodium] Other (See Comments)    Numbness and tingling in hand.  . Sulfonamide Derivatives Other (See Comments)    Gi upset

## 2013-09-18 NOTE — Telephone Encounter (Signed)
Pt aware. Jennifer Castillo, CMA  

## 2013-09-30 ENCOUNTER — Encounter: Payer: Self-pay | Admitting: Internal Medicine

## 2013-10-04 ENCOUNTER — Ambulatory Visit (INDEPENDENT_AMBULATORY_CARE_PROVIDER_SITE_OTHER): Payer: Medicare Other | Admitting: Family Medicine

## 2013-10-04 ENCOUNTER — Encounter: Payer: Self-pay | Admitting: Family Medicine

## 2013-10-04 VITALS — BP 110/65 | HR 89 | Temp 97.8°F | Ht 69.0 in | Wt 109.0 lb

## 2013-10-04 DIAGNOSIS — R51 Headache: Secondary | ICD-10-CM

## 2013-10-04 MED ORDER — KETOROLAC TROMETHAMINE 30 MG/ML IJ SOLN: 30.0000 mg | Freq: Once | INTRAMUSCULAR | Status: AC

## 2013-10-04 MED ORDER — KETOROLAC TROMETHAMINE 30 MG/ML IJ SOLN
30.0000 mg | Freq: Once | INTRAMUSCULAR | Status: AC
  Administered 2013-10-04: 30 mg via INTRAMUSCULAR

## 2013-10-04 NOTE — Progress Notes (Signed)
   Subjective:    Patient ID: Kristy Garza, female    DOB: 1950/04/18, 63 y.o.   MRN: [id number]  HPI C/o frontal headache for a day and wants a shot or something to get rid of it.  She states she saw Her ENT and she was told she may have an ethmoid blockage and she was offered abx's but did not want to take them.  She also has seen her neurologist who placed her on trileptal and she cannot tolerate it.  She has problems with her teeth hurting.  She has seen a chiropractor who has told her she has a pinched nerve.   Review of Systems C/o headache No chest pain, SOB, dizziness, vision change, N/V, diarrhea, constipation, dysuria, urinary urgency or frequency, myalgias, arthralgias or rash.     Objective:   Physical Exam Vital signs noted  Well developed well nourished female.  HEENT - Head atraumatic Normocephalic                Eyes - PERRLA, Conjuctiva - clear Sclera- Clear EOMI                Ears - EAC's Wnl TM's Wnl Gross Hearing WNL                 Throat - oropharanx wnl Respiratory - Lungs CTA bilateral Cardiac - RRR S1 and S2 without murmur GI - Abdomen soft Nontender and bowel sounds active x 4 Extremities - No edema. Neuro - Grossly intact.       Assessment & Plan:  Headache(784.0) - Plan: ketorolac (TORADOL) 30 MG/ML injection 30 mg, ketorolac (TORADOL) 30 MG/ML injection 30 mg Follow up with ENT and neurology  Lysbeth Penner FNP

## 2013-10-09 ENCOUNTER — Telehealth: Payer: Self-pay | Admitting: Family Medicine

## 2013-10-09 MED ORDER — RALOXIFENE HCL 60 MG PO TABS: 60.0000 mg | ORAL_TABLET | Freq: Every day | ORAL | Status: DC

## 2013-10-09 NOTE — Telephone Encounter (Signed)
Discussed treatment options.  Patient did not want any bisphosphonates due to possible side effects.  She also did not want to try Forteo.  She agreed to try Evista 60mg  1 table tdaily - rx ssent to pharmacy

## 2013-10-10 ENCOUNTER — Ambulatory Visit: Payer: Medicare Other | Admitting: Family Medicine

## 2013-10-10 NOTE — Telephone Encounter (Signed)
Spoke with patient and advised she needed to be seen

## 2013-10-11 ENCOUNTER — Encounter: Payer: Self-pay | Admitting: Family

## 2013-10-11 ENCOUNTER — Ambulatory Visit (INDEPENDENT_AMBULATORY_CARE_PROVIDER_SITE_OTHER): Payer: Medicare Other | Admitting: Family

## 2013-10-11 VITALS — BP 103/69 | HR 73 | Temp 98.0°F | Ht 69.0 in | Wt 111.4 lb

## 2013-10-11 DIAGNOSIS — M542 Cervicalgia: Secondary | ICD-10-CM

## 2013-10-11 DIAGNOSIS — R51 Headache: Secondary | ICD-10-CM

## 2013-10-11 DIAGNOSIS — R519 Headache, unspecified: Secondary | ICD-10-CM

## 2013-10-11 DIAGNOSIS — R6884 Jaw pain: Secondary | ICD-10-CM

## 2013-10-11 MED ORDER — CYCLOBENZAPRINE HCL 5 MG PO TABS: 5.0000 mg | ORAL_TABLET | Freq: Three times a day (TID) | ORAL | Status: DC | PRN

## 2013-10-11 NOTE — Patient Instructions (Signed)

## 2013-10-11 NOTE — Progress Notes (Signed)
   Subjective:    Patient ID: Kristy Garza, female    DOB: 1950/07/08, 63 y.o.   MRN: [id number]  Dental Pain  This is a new problem. The current episode started more than 1 month ago. The problem occurs constantly. The problem has been unchanged. The pain is at a severity of 7/10. The pain is moderate. Associated symptoms include facial pain. Pertinent negatives include no fever. She has tried acetaminophen and NSAIDs for the symptoms. The treatment provided moderate relief.   *Pt states she went to the densits and was told it was not "teeth related" or "TMJ". Pt states this pain in her jaw, upper teeth, face, and neck has been going on for 3 months. PT's ENT ruled out sinus infection.     Review of Systems  Constitutional: Negative.  Negative for fever.  HENT: Negative.   Eyes: Negative.   Respiratory: Negative.  Negative for shortness of breath.   Cardiovascular: Negative.  Negative for palpitations.  Gastrointestinal: Negative.   Endocrine: Negative.   Genitourinary: Negative.   Musculoskeletal: Negative.   Neurological: Negative.  Negative for headaches.  Hematological: Negative.   Psychiatric/Behavioral: Negative.   All other systems reviewed and are negative.      Objective:   Physical Exam  Vitals reviewed. Constitutional: She is oriented to person, place, and time. She appears well-developed and well-nourished. No distress.  HENT:  Head: Normocephalic and atraumatic.  Right Ear: External ear normal.  Mouth/Throat: Oropharynx is clear and moist.  Eyes: Pupils are equal, round, and reactive to light.  Neck: Normal range of motion. Neck supple. No thyromegaly present.  Cardiovascular: Normal rate, regular rhythm, normal heart sounds and intact distal pulses.   No murmur heard. Pulmonary/Chest: Effort normal and breath sounds normal. No respiratory distress. She has no wheezes.  Abdominal: Soft. Bowel sounds are normal. She exhibits no distension. There is no  tenderness.  Musculoskeletal: Normal range of motion. She exhibits no edema and no tenderness.  Neurological: She is alert and oriented to person, place, and time. She has normal reflexes. No cranial nerve deficit.  Skin: Skin is warm and dry.  Psychiatric: She has a normal mood and affect. Her behavior is normal. Judgment and thought content normal.    BP 103/69  Pulse 73  Temp(Src) 98 F (36.7 C)  Ht 5\' 9"  (1.753 m)  Wt 111 lb 6.4 oz (50.531 kg)  BMI 16.44 kg/m2       Assessment & Plan:  1. Jaw pain - cyclobenzaprine (FLEXERIL) 5 MG tablet; Take 1 tablet (5 mg total) by mouth 3 (three) times daily as needed for muscle spasms.  Dispense: 60 tablet; Refill: 1  2. Face pain - cyclobenzaprine (FLEXERIL) 5 MG tablet; Take 1 tablet (5 mg total) by mouth 3 (three) times daily as needed for muscle spasms.  Dispense: 60 tablet; Refill: 1  3. Neck pain - cyclobenzaprine (FLEXERIL) 5 MG tablet; Take 1 tablet (5 mg total) by mouth 3 (three) times daily as needed for muscle spasms.  Dispense: 60 tablet; Refill: 1  RTO prn If muscle relaxant does not work, may need pain clinic referral Discussed flexeril may cause drowsiness- Pt to take at night to see how she reacts  Evelina Dun, FNP

## 2013-10-16 ENCOUNTER — Telehealth: Payer: Self-pay | Admitting: Family

## 2013-10-16 DIAGNOSIS — M542 Cervicalgia: Secondary | ICD-10-CM

## 2013-10-16 DIAGNOSIS — R6884 Jaw pain: Secondary | ICD-10-CM

## 2013-10-25 ENCOUNTER — Encounter: Payer: Self-pay | Admitting: Internal Medicine

## 2013-10-25 ENCOUNTER — Ambulatory Visit (INDEPENDENT_AMBULATORY_CARE_PROVIDER_SITE_OTHER): Payer: Medicare Other | Admitting: Internal Medicine

## 2013-10-25 VITALS — BP 104/58 | HR 71 | Ht 69.0 in | Wt 108.0 lb

## 2013-10-25 DIAGNOSIS — R059 Cough, unspecified: Secondary | ICD-10-CM

## 2013-10-25 DIAGNOSIS — K219 Gastro-esophageal reflux disease without esophagitis: Secondary | ICD-10-CM

## 2013-10-25 DIAGNOSIS — J302 Other seasonal allergic rhinitis: Secondary | ICD-10-CM

## 2013-10-25 DIAGNOSIS — J309 Allergic rhinitis, unspecified: Secondary | ICD-10-CM

## 2013-10-25 DIAGNOSIS — J440 Chronic obstructive pulmonary disease with acute lower respiratory infection: Secondary | ICD-10-CM

## 2013-10-25 DIAGNOSIS — R05 Cough: Secondary | ICD-10-CM

## 2013-10-25 DIAGNOSIS — G4763 Sleep related bruxism: Secondary | ICD-10-CM

## 2013-10-25 NOTE — Patient Instructions (Signed)
We will start allergy vaccine. I will send a prescription to the allergy lab, based on your tests. The allergy lab will call you about getting started.

## 2013-10-25 NOTE — Progress Notes (Signed)
12/28/12- 41 yoF never smoker-Dr Wong/ [facility]-last seen by [facility] 2009; seen at [facility] chest 2002; chronic cough  Has seen Dr. Lamonte Sakai here and Dr Neldon Mc in the past. Complains of allergies, asthma, chronic cough for over 20 years. Help with inhalers and cough medicines. Using rescue inhaler once or twice a day. Not using Qvar now. Particularly bad in spring and fall. [place] smells musty when it rains. She thinks in retrospect that allergy shots in the past with more than she realized. She had one episode while on allergy shots, when she had throat tightness which scared her so she quit.. Occasional cough at night. Previous PFTs are filed, but as recently as 2009 showed reversible obstructive airways disease and small airways. Singulair blamed for paresthesias. Son smokes outside the home. History of sinus surgery, vocal cord polyp. Father had allergies and asthma  02/07/13- 62 yoF never smoker-Dr Wong/ [facility]-last seen by [facility] 2009; seen at [facility] chest 2002; followed for chronic cough, allergic rhinitis  ACUTE VISIT:  Cough worsening over the past month.  Cough dry-persistant  cough worse in the past week. Losing weight attributed to poor appetite which she blames on acid indigestion despite pantoprazole/ elevated HOB. She's worried the chest x-ray suggests emphysema. Anoro blamed for chest pain. CXR 01/30/13 CLINICAL DATA: Cough.  EXAM:  CHEST 2 VIEW  COMPARISON: Chest radiograph 05/15/2012.  FINDINGS:  The cardiopericardial silhouette and mediastinal contours are within  normal limits. Hyperinflation and severe emphysema is present. No  airspace disease or pleural effusion is identified.  IMPRESSION:  Emphysema without acute cardiopulmonary disease.  Electronically Signed  By: Dereck Ligas M.D.  On: 01/30/2013 13:00  03/15/13- 62 yoF never smoker-Dr Wong/ [facility]-last seen by [facility] 2009; seen at [facility] chest 2002; followed for chronic cough, allergic rhinitis  ACUTE VISIT: Cough since 01/2013.  Saw her PCP and was given prednisone with no relief. Reports cough, ear pain, chest tightness and congestion. Had outpatient bladder surgery and has been coughing up blood since then. Sinus surgery summer 2014. Now 5 days of increased head congestion. New hemoptysis, daily, since bladder surgery 5 days ago during which she was intubated. Now on cephalexin for UTI. Notices frontal headache, ears full. Treating leg cramps with bananas and mustard a1AT level 02/07/13- WNL  MM 167. CXR 01/30/13 IMPRESSION:  Emphysema without acute cardiopulmonary disease.  Electronically Signed  By: Dereck Ligas M.D.  On: 01/30/2013 13:00  03/29/13- 62 yoF never smoker (father smoked)- followed for chronic cough, allergic rhinitis , COPD by CT Pt c/o sinus pressure, headaches, ear pain, prod cough with sometimes clear mucous  She had prednisone in early Dec for cough, then reported streak hemoptysis and had CT. Has finished Keflex and prednisone with no more hemoptysis Admits head and sinus pressure. Taking Sudafed.  Has followup appointment with ENT in [place]. Allergy profile 12/28/2012 negative with total IgE 4.5. a1AT 02/07/13- WNL MM, 167 CT chest 03/25/13 IMPRESSION:  1. No CT evidence of acute pulmonary embolism.  2. Emphysema. No acute cardiopulmonary process identified.  Electronically Signed  By: Jeannine Boga M.D.  On: 03/25/2013 13:31  05/02/13- 85 yoF never smoker (father smoked)- followed for chronic cough, allergic rhinitis , COPD by CT No antihistmines, OTC cough syrups, or sleep aids in past 3 days; Pt having continues to have chest congestion, cough,"lump in throat feeling", sore throat as well. Husband is currently sick as well; wonders if she should have test today. ENT in [facility] gave augmentin 875, 3x daily> diarrhea  after 4 doses, but she thought it had started to help. He is scheduling CT sinus.  She bought colloidal silver preparation at [facility] store, but her  accupuncturist told her not to take it. Friend recommended alternative practitioner who gives Vit C infusions.  Main c/o is globus sensation with cough, maxillary facial pressure. Husband is sick. We are deferring allergy testing.  CT maxfac 05/13/2011 IMPRESSION:  No paranasal sinuses mucosal thickening or air-fluid levels.  Midline nasal septum. Question prior surgery bilateral medial wall  maxillary sinus and right ethmoid air cells.  Original Report Authenticated By: Lahoma Crocker, M.D. QuickVue Flu swab 25/15- NEG  05/30/13- 39 yoF never smoker (father smoked)- followed for chronic cough, allergic rhinitis , COPD by CT FOLLOWS FOR:  c/o: sinus pressure and cough with clear thick mucus and ringing in ears still. Raspy throat, but much better than before. Taking cefdinir 300 mg once daily for tolerance- 3 more days. ENT said sinuses ok at [facility] Asks depo- prefers over prednisone.  08/28/13- 40 yoF never smoker (father smoked)- followed for chronic cough, allergic rhinitis , COPD by CT(Nl a1AT) No antihistamines, OTC sleep aids in past 3 days. Pt had 2 teaspoons of Tussin DM at 11:00am this morning. Complains of facial pain, sensitive upper teeth. Told by ENT sinuses good, but Rx'd nasal mometasone and tobra. Dentist says tooth pain not from sinuses. Integrative medicine doctor in [place] did some Immun[facility] tests but she isn't going to follow up there.Continues Qvar. 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 For allergy skin testing 08/28/13-Positive- grass, tree, weed, dust. She will decide about allergy shots. Dr Laurance Flatten could give once she is at maintenance.  10/25/13-  10 yoF never smoker (father smoked)- followed for chronic cough, allergic rhinitis , COPD by CT(Nl a1AT) FOLLOWS FOR: pt c/o teeth pain, headaches.  Pt is interested in starting allergy injections.   Went to a ENT in [place]-CT sinus showed questionable polyp in right ethmoid sinus versus mucus. Sinus culture "scant bacteria". She is using a  tobramycin nasal rinse and being treated for thrush. Chiropractor treated pinched nerve in neck Neurologist tried gabapentin and one other medication Dentist told her "teeth are not causing it"-feelings of pressure in her nose and sinuses. She wears a mouth guard at night. We had commented on obvious aware of her teeth before, and potential significance of bruxism. She is also seeing an herbalist who was treated her by putting seeds in her ear. She was interested in allergy vaccine before but did not want to have to drive here twice a week during the buildup. We discussed realistic expectations and we discussed availability of SLIT therapy for grass and ragweed.  ROS-see HPI Constitutional:   No-   weight loss, night sweats, fevers, chills, fatigue, lassitude. HEENT:   + headaches, no-difficulty swallowing, +tooth/dental problems, +sore throat,       No-  sneezing, itching, ear ache, +nasal congestion, post nasal drip,  CV:  No-   chest pain, orthopnea, PND, swelling in lower extremities, anasarca, dizziness, palpitations Resp: No-   shortness of breath with exertion or at rest.              No-   productive cough,  + non-productive cough,  No- coughing up of blood.              No-   change in color of mucus.  No- wheezing.   Skin: No-   rash or lesions. GI:   No-heartburn, indigestion,  loss of appetite GU: .  MS:  No-   joint pain or swelling.   Neuro-     nothing unusual Psych:  No- change in mood or affect. No depression or anxiety.  No memory loss.  OBJ- Physical Exam General- Alert, Oriented, Affect-appropriate, Distress- none acute, Trim. Sits hunched but can straighten. Skin- rash-none, lesions- none, excoriation- none Lymphadenopathy- none Head- atraumatic            Eyes- Gross vision intact, PERRLA, conjunctivae and secretions clear            Ears- Hearing, canals-normal            Nose- Clear, no-Septal dev, mucus, polyps, erosion, perforation             Throat- Mallampati  II , mucosa+thrush , drainage- none, tonsils- atrophic.  +Much dental                          Repair, +throat clearing Neck- flexible , trachea midline, no stridor , thyroid nl, carotid no bruit Chest - symmetrical excursion , unlabored           Heart/CV- RRR , no murmur , no gallop  , no rub, nl s1 s2                           - JVD- none , edema- none, stasis changes- none, varices- none           Lung- clear to P&A, wheeze- none, cough-none , dullness-none, rub- none. Long/thin                           thorax.           Chest wall-  Abd-  Br/ Gen/ Rectal- Not done, not indicated Extrem- cyanosis- none, clubbing, none, atrophy- none, strength- nl Neuro- grossly intact to observation

## 2013-10-26 ENCOUNTER — Telehealth: Payer: Self-pay | Admitting: Family Medicine

## 2013-10-26 NOTE — Telephone Encounter (Signed)
Explained that flexeril will cause drowsiness. Suggested she take 1/2 tablet. Cautioned that this may still cause drowsiness and she should avoid driving while she's taking it.

## 2013-10-27 NOTE — Assessment & Plan Note (Signed)
Wearing a bite block at night

## 2013-10-27 NOTE — Assessment & Plan Note (Signed)
We compared allergy vaccine,, SLIT therapy and current measures. She has decided that she does want to start allergy vaccine. She will get her buildup injections here and then continue maintenance in [place]. Plan- start allergy vaccine. The allergy lab will contact her about the logistics.

## 2013-10-27 NOTE — Assessment & Plan Note (Signed)
Fair control.

## 2013-10-27 NOTE — Assessment & Plan Note (Signed)
Reminded her of reflux precautions

## 2013-10-27 NOTE — Assessment & Plan Note (Signed)
Thrush, post nasal drip and reflux may contribute. Cannot exclude a cough equivalent asthma

## 2013-11-01 ENCOUNTER — Ambulatory Visit (INDEPENDENT_AMBULATORY_CARE_PROVIDER_SITE_OTHER): Payer: Medicare Other | Admitting: Family Medicine

## 2013-11-01 ENCOUNTER — Encounter: Payer: Self-pay | Admitting: Family Medicine

## 2013-11-01 VITALS — BP 99/67 | HR 69 | Temp 98.2°F | Ht 69.0 in | Wt 107.4 lb

## 2013-11-01 DIAGNOSIS — R5381 Other malaise: Secondary | ICD-10-CM

## 2013-11-01 DIAGNOSIS — K1370 Unspecified lesions of oral mucosa: Secondary | ICD-10-CM

## 2013-11-01 DIAGNOSIS — B370 Candidal stomatitis: Secondary | ICD-10-CM

## 2013-11-01 DIAGNOSIS — R5383 Other fatigue: Secondary | ICD-10-CM

## 2013-11-01 DIAGNOSIS — K1379 Other lesions of oral mucosa: Secondary | ICD-10-CM

## 2013-11-01 LAB — POCT CBC
Granulocyte percent: 74.1 %G (ref 37–80)
HCT, POC: 34.7 % — AB (ref 37.7–47.9)
Hemoglobin: 11.1 g/dL — AB (ref 12.2–16.2)
Lymph, poc: 1 (ref 0.6–3.4)
MCH, POC: 30 pg (ref 27–31.2)
MCHC: 31.9 g/dL (ref 31.8–35.4)
MCV: 94 fL (ref 80–97)
MPV: 8.4 fL (ref 0–99.8)
POC Granulocyte: 3.7 (ref 2–6.9)
POC LYMPH PERCENT: 20.3 %L (ref 10–50)
Platelet Count, POC: 222 10*3/uL (ref 142–424)
RBC: 3.7 M/uL — AB (ref 4.04–5.48)
RDW, POC: 13 %
WBC: 5 10*3/uL (ref 4.6–10.2)

## 2013-11-01 LAB — POCT UA - MICROSCOPIC ONLY
Bacteria, U Microscopic: NEGATIVE
Casts, Ur, LPF, POC: NEGATIVE
Crystals, Ur, HPF, POC: NEGATIVE
Mucus, UA: NEGATIVE
Yeast, UA: NEGATIVE

## 2013-11-01 LAB — POCT URINALYSIS DIPSTICK

## 2013-11-01 MED ORDER — FLUCONAZOLE 150 MG PO TABS: 150.0000 mg | ORAL_TABLET | Freq: Once | ORAL | Status: DC

## 2013-11-01 MED ORDER — KETOROLAC TROMETHAMINE 30 MG/ML IM SOLN: 30.0000 mg | Freq: Once | INTRAMUSCULAR | Status: DC

## 2013-11-01 NOTE — Addendum Note (Signed)
Addended by: Waverly Ferrari on: 11/01/2013 06:29 PM   Modules accepted: Orders

## 2013-11-01 NOTE — Progress Notes (Signed)
   Subjective:    Patient ID: Kristy Garza, female    DOB: [birth year], 63 y.o.   MRN: [id number]  HPI  C/o thrush and bad taste in her mouth.  She states she is having yeast in her mouth and her Throat and she is sure she has it in her intestines.  She states she may have it in her skin as well. She is wanting blood work.  She wants a cbc to make sure her immune system is intact.  Review of Systems No chest pain, SOB, HA, dizziness, vision change, N/V, diarrhea, constipation, dysuria, urinary urgency or frequency, myalgias, arthralgias or rash.     Objective:   Physical Exam  Vital signs noted  Well developed well nourished female.  HEENT - Head atraumatic Normocephalic                Eyes - PERRLA, Conjuctiva - clear Sclera- Clear EOMI                Ears - EAC's Wnl TM's Wnl Gross Hearing WNL                Nose - Nares patent                 Throat - oropharanx wnl Respiratory - Lungs CTA bilateral Cardiac - RRR S1 and S2 without murmur GI - Abdomen soft Nontender and bowel sounds active x 4 Extremities - No edema. Neuro - Grossly intact.      Assessment & Plan:  Oral candidiasis - Plan: fluconazole (DIFLUCAN) 150 MG tablet, Fungus Culture W/Rfx Rapid ID  Other malaise and fatigue - Plan: POCT CBC, CMP14+EGFR, TSH, fluconazole (DIFLUCAN) 150 MG tablet, Vitamin B12, POCT urinalysis dipstick, POCT UA - Microscopic Only, Vit D  25 hydroxy (rtn osteoporosis monitoring)  Mouth pain - Plan: Upper Respiratory Culture, Routine, Fungus Culture W/Rfx Rapid ID  Lysbeth Penner FNP

## 2013-11-02 LAB — VITAMIN B12: Vitamin B-12: >1999 pg/mL — ABNORMAL HIGH (ref 211–946)

## 2013-11-02 LAB — CMP14+EGFR
ALT: 22 IU/L (ref 0–32)
AST: 30 IU/L (ref 0–40)
Albumin/Globulin Ratio: 2.4 (ref 1.1–2.5)
Albumin: 4.8 g/dL (ref 3.6–4.8)
Alkaline Phosphatase: 61 IU/L (ref 39–117)
BUN/Creatinine Ratio: 24 (ref 11–26)
BUN: 19 mg/dL (ref 8–27)
CO2: 25 mmol/L (ref 18–29)
Calcium: 10 mg/dL (ref 8.7–10.3)
Chloride: 103 mmol/L (ref 97–108)
Creatinine, Ser: 0.79 mg/dL (ref 0.57–1.00)
GFR calc Af Amer: 93 mL/min/{1.73_m2} (ref >59)
GFR calc non Af Amer: 80 mL/min/{1.73_m2} (ref >59)
Globulin, Total: 2 g/dL (ref 1.5–4.5)
Glucose: 83 mg/dL (ref 65–99)
Potassium: 4.3 mmol/L (ref 3.5–5.2)
Sodium: 141 mmol/L (ref 134–144)
Total Bilirubin: 0.3 mg/dL (ref 0.0–1.2)
Total Protein: 6.8 g/dL (ref 6.0–8.5)

## 2013-11-02 LAB — TSH: TSH: 0.548 u[IU]/mL (ref 0.450–4.500)

## 2013-11-02 LAB — VITAMIN D 25 HYDROXY (VIT D DEFICIENCY, FRACTURES): Vit D, 25-Hydroxy: 66.3 ng/mL (ref 30.0–100.0)

## 2013-11-04 LAB — UPPER RESPIRATORY CULTURE, ROUTINE

## 2013-11-05 ENCOUNTER — Telehealth: Payer: Self-pay | Admitting: Internal Medicine

## 2013-11-05 ENCOUNTER — Telehealth: Payer: Self-pay | Admitting: Family Medicine

## 2013-11-05 NOTE — Telephone Encounter (Signed)
Noted  

## 2013-11-05 NOTE — Telephone Encounter (Signed)
I called Kristy Garza about starting her vac. Since I never heard from her about starting in the middle June/2015. She's been sick with an infection for 21 days and can't rid of it. She has taken so many medications she's not able to drive to [place] from [place]. I ask her to call me when she was able to come in and I would make up her vac.. Thanks, Alroy Bailiff

## 2013-11-05 NOTE — Telephone Encounter (Signed)
vm not set up

## 2013-11-05 NOTE — Telephone Encounter (Signed)
Labs were normal.  Do not need to see her.  She needs to give the nystatin time to work.

## 2013-11-06 ENCOUNTER — Ambulatory Visit (INDEPENDENT_AMBULATORY_CARE_PROVIDER_SITE_OTHER): Payer: Medicare Other | Admitting: Family Medicine

## 2013-11-06 ENCOUNTER — Encounter: Payer: Self-pay | Admitting: Family Medicine

## 2013-11-06 VITALS — BP 102/70 | HR 67 | Temp 97.2°F | Ht 69.0 in | Wt 106.4 lb

## 2013-11-06 DIAGNOSIS — R64 Cachexia: Secondary | ICD-10-CM

## 2013-11-06 MED ORDER — MEGESTROL ACETATE 400 MG/10ML PO SUSP: 800.0000 mg | Freq: Every day | ORAL | Status: DC

## 2013-11-06 NOTE — Progress Notes (Signed)
   Subjective:    Patient ID: Kristy Garza, female    DOB: 03-Oct-1950, 63 y.o.   MRN: [id number]  HPI  This 63 y.o. female presents for evaluation of worries of thrush and congestion.  She has been having poor appetitie.  Review of Systems C/o poor appetite and congestion. No chest pain, SOB, HA, dizziness, vision change, N/V, diarrhea, constipation, dysuria, urinary urgency or frequency, myalgias, arthralgias or rash.     Objective:   Physical Exam  Vital signs noted  Cachetic appearing female in NAD.  HEENT - Head atraumatic Normocephalic                Eyes - PERRLA, Conjuctiva - clear Sclera- Clear EOMI                Ears - EAC's Wnl TM's Wnl Gross Hearing WNL                Throat - oropharanx wnl Respiratory - Lungs CTA bilateral Cardiac - RRR S1 and S2 without murmur GI - Abdomen soft Nontender and bowel sounds active x 4 Extremities - No edema. Neuro - Grossly intact.      Assessment & Plan:  Cachexia - Plan: megestrol (MEGACE) 400 MG/10ML suspension  Discussed with patient that her cx did show normal flora and no yeast so she does not have thrush.  Follow up with ENT for congestion.  Lysbeth Penner FNP

## 2013-11-06 NOTE — Telephone Encounter (Signed)
Patient seen by provider on 11-06-2013.

## 2013-11-07 ENCOUNTER — Ambulatory Visit (INDEPENDENT_AMBULATORY_CARE_PROVIDER_SITE_OTHER): Payer: Medicare Other | Admitting: Pharmacist

## 2013-11-07 ENCOUNTER — Encounter: Payer: Self-pay | Admitting: Pharmacist

## 2013-11-07 VITALS — BP 101/68 | HR 80 | Ht 69.0 in | Wt 107.0 lb

## 2013-11-07 DIAGNOSIS — Z79899 Other long term (current) drug therapy: Secondary | ICD-10-CM

## 2013-11-07 DIAGNOSIS — R634 Abnormal weight loss: Secondary | ICD-10-CM

## 2013-11-07 NOTE — Patient Instructions (Signed)
Discontinue simlase Discontinue Quercetin Discontinue B-Complex and B 12 supplementation.

## 2013-11-07 NOTE — Progress Notes (Signed)
CC:  Weight loss and medication management  HPI:  Patient comes today concerned about weight loss and wanting a review of current medications.  She reports about a 10# weight loss over the last year but according to the a review of office visit from 2014 compared to today's weight only about 2-3#.  Patient is under weight.   She has recently seen pulmonologist, allergist, neurologist and integrative physician regarding chronic conditions such as persistent cough, possible GERD, chronic fatigue and pain.  She has many test performed and alternative medications started by Dr Albertina Parr the integrative physician but the cost of continuing to see him and of the medications is too much.   Current medications include: Outpatient Encounter Prescriptions as of 11/07/2013  Medication Sig Note  . albuterol (PROVENTIL) (2.5 MG/3ML) 0.083% nebulizer solution Take 3 mLs (2.5 mg total) by nebulization every 6 (six) hours as needed for wheezing or shortness of breath.   . ALPRAZolam (XANAX) 0.5 MG tablet TAKE 1 TABLET BY MOUTH THREE TIMES DAILY AS NEEDED FOR ANXIETY related to coughing   . azelastine (ASTELIN) 0.1 % nasal spray Place 1 spray into both nostrils 2 (two) times daily. Use in each nostril as directed   . benzonatate (TESSALON) 200 MG capsule TAKE 1-2 CAPSULES (100-200 MG TOTAL) BY MOUTH 3 (THREE) TIMES DAILY AS NEEDED FOR COUGH.   . Calcium-Magnesium-Vitamin D (CITRACAL CALCIUM+D PO) Take 1 tablet by mouth daily.    . Cholecalciferol (VITAMIN D PO) Take 5,000 Units by mouth daily.    . Cranberry-Vitamin C-Probiotic (AZO CRANBERRY) 250-30 MG TABS Take 2 tablets by mouth 2 (two) times daily.   . cyanocobalamin 100 MCG tablet Take 1,000 mcg by mouth daily.    . cyclobenzaprine (FLEXERIL) 5 MG tablet Take 5 mg by mouth 3 (three) times daily as needed for muscle spasms. Take 1/2 to 1 tablet as needed up to 3 times a day as needed   . ELMIRON 100 MG capsule Take 200 mg by mouth 2 (two) times daily.  11/06/2013:  Received from: External Pharmacy  . Ferrous Sulfate Dried 140 (45 FE) MG TBCR Take 1 tablet by mouth every evening.   . fluticasone (FLONASE) 50 MCG/ACT nasal spray  11/06/2013: Received from: External Pharmacy  . guaiFENesin (MUCINEX) 600 MG 12 hr tablet Take 600 mg by mouth 2 (two) times daily.   [name] lidocaine (XYLOCAINE) 2 % jelly Apply topically as needed. 01/30/2013: Received from: External Pharmacy Received Sig:   . Linaclotide (LINZESS) 290 MCG CAPS capsule Take 290 mcg by mouth every other day.   . mometasone (ELOCON) 0.1 % lotion  08/28/2013: Received from: External Pharmacy  . Nutritional Supplements (DHEA PO) Take 5 mg by mouth daily.    . Omega-3 Fatty Acids (FISH OIL) 1000 MG CAPS Take 1 capsule by mouth 2 (two) times daily.    [name] OVER THE COUNTER MEDICATION Cysta-Q 1 tablet bid   . OVER THE COUNTER MEDICATION Iodine complex 12.5mg  capsules - 1 capsule daily   . pantoprazole (PROTONIX) 40 MG tablet Take 40 mg by mouth daily.   . polyethylene glycol (MIRALAX / GLYCOLAX) packet Take 17 g by mouth daily as needed.   . Probiotic Product (ALIGN) 4 MG CAPS Take 1 capsule by mouth daily.   . Quercetin (QUERCITIN) POWD 1 tablet by Does not apply route 2 (two) times daily.    . raloxifene (EVISTA) 60 MG tablet Take 1 tablet (60 mg total) by mouth daily.   . [DISCONTINUED] ALPRAZolam Duanne Moron)  0.5 MG tablet TAKE 1 TABLET BY MOUTH THREE TIMES DAILY AS NEEDED FOR ANXIETY   . beclomethasone (QVAR) 40 MCG/ACT inhaler Inhale 2 puffs into the lungs 2 (two) times daily.   . famotidine (PEPCID) 20 MG tablet Take 20 mg by mouth every evening.  11/06/2013: Received from: External Pharmacy  . megestrol (MEGACE) 400 MG/10ML suspension Take 20 mLs (800 mg total) by mouth daily.   [name] OVER THE COUNTER MEDICATION Similase 1 tablet before each meal   . [DISCONTINUED] fluconazole (DIFLUCAN) 150 MG tablet Take 1 tablet (150 mg total) by mouth once.   . [DISCONTINUED] ketorolac (TORADOL) 30 MG/ML injection Inject 1 mL (30  mg total) into the muscle once.   . [DISCONTINUED] Multiple Vitamin (MULTIVITAMIN) LIQD Take 5 mLs by mouth daily.    I have reviewed all the lab results. There are some abnormalities such as elevated B12 level - which was normal about 6 months ago.  History of low DHEA per labs ordered by Dr Albertina Parr.   Assessment: Weight loss / cachexia Medication management - patient is on lots of alternative medications that I don't think are benefiting her.  Elevated B12 - patient is taking 3 supplements containing B12  Plan: 1.  Discussed ways in increase weight - increase lean proteins - nuts, eggs, peanut butter and fish, vegetable and fruits, whole grains.  Gave recipe for strawberry, banana peanut butter smoothie.  Eat smaller, more frequent meals.   2.  Recommended exercise - silver sneakers once weekly to start to help increase lean body mass and build strength 3.  Discontinue B-Complex, vitamin B12, Quercetin and Similase (digestive enzyme) 4.  Follow up with specialist as recommended - Neurologist and Pulmonologist 5.  RTC in 1 month for AWV and to check labs - DHEA and iodine and selenium is able

## 2013-11-08 ENCOUNTER — Telehealth: Payer: Self-pay | Admitting: Family Medicine

## 2013-11-08 DIAGNOSIS — B370 Candidal stomatitis: Secondary | ICD-10-CM

## 2013-11-08 NOTE — Telephone Encounter (Signed)
Will have to wait and see ENT

## 2013-11-11 ENCOUNTER — Telehealth: Payer: Self-pay | Admitting: Family

## 2013-11-11 NOTE — Telephone Encounter (Signed)
Patient needs a copy of her last labs to take to another doctor.  We need to give her the results of her labs before we actually run her a copy. Can you please look and sign off on them.

## 2013-11-12 ENCOUNTER — Other Ambulatory Visit: Payer: Self-pay | Admitting: Family

## 2013-11-12 ENCOUNTER — Telehealth: Payer: Self-pay | Admitting: Family Medicine

## 2013-11-12 NOTE — Telephone Encounter (Signed)
Pt notified to take Diflucan as prescribed by Dietrich Pates Pt has follow up appt with ENT tomorrow

## 2013-11-12 NOTE — Telephone Encounter (Signed)
Pt got neurologist to do referral

## 2013-11-14 ENCOUNTER — Ambulatory Visit (INDEPENDENT_AMBULATORY_CARE_PROVIDER_SITE_OTHER): Payer: Medicare Other | Admitting: Family Medicine

## 2013-11-14 ENCOUNTER — Encounter: Payer: Self-pay | Admitting: Family Medicine

## 2013-11-14 VITALS — BP 108/69 | HR 65 | Temp 97.7°F | Ht 69.0 in | Wt 109.0 lb

## 2013-11-14 DIAGNOSIS — F411 Generalized anxiety disorder: Secondary | ICD-10-CM

## 2013-11-14 MED ORDER — ALPRAZOLAM 0.5 MG PO TABS: 0.5000 mg | ORAL_TABLET | Freq: Two times a day (BID) | ORAL | Status: DC

## 2013-11-14 NOTE — Patient Instructions (Signed)
24-Hour Urine Collection HOME CARE  When you get up in the morning on the day you do this test, pee (urinate) in the toilet and flush. Make a note of the time. This will be your start time on the day of collection and the end time on the next morning.  From then on, save all your pee (urine) in the plastic jug that was given to you.  You should stop collecting your pee 24 hours after you started. Place the first urine of the next morning in the container.  If the plastic jug that is given to you already has liquid in it, that is okay. Do not throw out the liquid or rinse out the jug. Some tests need the liquid to be added to your pee.  Keep the jug cool Keep your plastic jug cool Document Released: 06/10/2008 Document Revised: 06/06/2011 Document Reviewed: 06/10/2008 [facility] Patient Information 2015 [place], [place]. This information is not intended to replace advice given to you by your health care provider. Make sure you discuss any questions you have with your health care provider.

## 2013-11-14 NOTE — Progress Notes (Signed)
Patient ID: Kristy Garza, female   DOB: 1950/05/30, 63 y.o.   MRN: [id number] BP 108/69  Pulse 65  Temp(Src) 97.7 F (36.5 C) (Oral)  Ht 5\' 9"  (1.753 m)  Wt 109 lb (49.442 kg)  BMI 16.09 kg/m70  S: 63 year old white female, a frequent visitor to this office and many other offices who presents today thinking she needs to be screen for toxins or possibly heavy metals. Paranoia night for has some jean whereby she cannot excrete heavy metals from her liver and so she thinks that might be a possibility. She does complain of some odor to her body as well as a bad taste in her mouth. She is on many many supplements and herbal medicines. She was seen by our pharmacologist last week who suggested that she stop many of them but she has not. We went over her medicines one by one. Most are unknown to me. I got her to agree holding off on all of these Chinese herbal supplements etc. etc. for at least one week to give her body a chance to life without them. I did agree to screen her for heavy metals and will do that with the 24 hour urine collection and rule out accumulations of arsenic lead copper etc. I did not want her to stop any of her prescription medicines which include medicines for IBS, interstitial cystitis, atypical trigeminal neuralgia, among others. I did agree to refill her Xanax 0.5 twice a day. I'm in hopes that this will help relieve some of her symptoms. She does have an appointment with the infectious disease and no blunt next week and I encouraged her to keep this appointment.  O: Exam today was limited to just talking to her trying to get a sense of who she is and where she's coming from. There is a suggestion because she writes down all her symptoms and medicines in a notebook of what the [place] call malady of a little piece of paper a sign of possible neuroses. I think if she is given a regularly scheduled appointments and some time is taken with her listening to her complaints of stories in  treating what is plausible that there is a possibility of helping her  P; see note above recheck in 1-2 weeks  Wardell Honour MD

## 2013-11-20 ENCOUNTER — Ambulatory Visit (INDEPENDENT_AMBULATORY_CARE_PROVIDER_SITE_OTHER): Payer: Medicare Other | Admitting: Family Medicine

## 2013-11-20 ENCOUNTER — Encounter: Payer: Self-pay | Admitting: Family Medicine

## 2013-11-20 VITALS — BP 98/64 | HR 70 | Temp 97.0°F | Ht 69.0 in | Wt 107.0 lb

## 2013-11-20 DIAGNOSIS — G500 Trigeminal neuralgia: Secondary | ICD-10-CM

## 2013-11-20 MED ORDER — KETOROLAC TROMETHAMINE 30 MG/ML IJ SOLN
30.0000 mg | Freq: Once | INTRAMUSCULAR | Status: AC
  Administered 2013-11-20: 30 mg via INTRAMUSCULAR

## 2013-11-20 NOTE — Progress Notes (Signed)
Patient ID: Kristy Garza, female   DOB: [birth year], 63 y.o.   MRN: [id number] S: Patient is seen today after just being seen less than one week ago. She brings back a 24 urine collection for heavy metal screening. It seems that she voided significantly more than the container withhold and brings specimens and other containers which we discarded but were run tests on the container provided. Since stopping most of her supplements and herbs she is noted no decline or deterioration in her symptoms her condition. Today she's complaining of pain in the facial area that sounds like sinus but she also has a history of atypical trigeminal neuralgia. She has not yet started the Tegretol at the neurologist gave her I encouraged her to try that at low dose and build up. Apparently she had a friend who had some untoward side effects and she is reluctant to start it. She is asking for pain relief today he would like a shot of Toradol. I am in agreement with that but stressed the importance of her not relying on the shots for symptoms. She also complains of chronic cough and postnasal drainage. She had been taking antihistamine which I think tends to thicken the drainage. I encouraged her to stop antihistamine just take Mucinex use hot showers and urination for this drainage.  O: Exam today confined to sinuses and face: There is no tenderness to percussion there is no purulent drainage and by history drainage is not dark. Throat is clear. There is no significant adenopathy. Lungs are clear to auscultation so any cough is related to postnasal drainage and upper airway issues.  A: No significant change will run urine for heavy metal screening would expect it to be normal but perhaps will find something we can treat Encouraged her to keep appointments for upcoming infectious disease consultation and MRI. She has been back to the [facility] food store and is followed mother unknown medicine for pain. I encouraged her to return  at. She grasps at sprawls frequently and we talked about that behavior  Wardell Honour MD

## 2013-11-20 NOTE — Addendum Note (Signed)
Addended by: Wyline Mood on: 11/20/2013 09:55 AM   Modules accepted: Orders

## 2013-11-22 LAB — HEAVY METALS SCREEN, URINE
ARSENIC(INORGANIC), U: NOT DETECTED ug/L (ref 0–19)
Arsenic Ur: NOT DETECTED ug/L (ref 0–50)
Creatinine(Crt),U: 0.32 g/L (ref 0.30–3.00)
LEAD RANDOM URINE: NOT DETECTED ug/L (ref 0–49)
Mercury, Ur: NOT DETECTED ug/L (ref 0–19)

## 2013-11-27 ENCOUNTER — Telehealth: Payer: Self-pay | Admitting: Family Medicine

## 2013-11-27 NOTE — Telephone Encounter (Signed)
Message copied by Waverly Ferrari on Wed Nov 27, 2013  9:24 AM ------      Message from: Wardell Honour      Created: Tue Nov 26, 2013 10:03 PM       24 hr urine negative for heavy metals ------

## 2013-11-28 ENCOUNTER — Telehealth: Payer: Self-pay | Admitting: Family Medicine

## 2013-11-28 NOTE — Telephone Encounter (Signed)
Just returning call.  Please use her cell number when calling her [phone]

## 2013-11-28 NOTE — Telephone Encounter (Signed)
Left message that heavy metal screen was negative.

## 2013-11-29 ENCOUNTER — Ambulatory Visit: Payer: Self-pay

## 2013-11-30 LAB — FUNGUS CULTURE W/RFX RAPID ID: Fungal Culture W/Rfx: POSITIVE

## 2013-11-30 LAB — FUNGAL ID BY MOLECULAR METHODS

## 2013-12-03 ENCOUNTER — Other Ambulatory Visit: Payer: Self-pay | Admitting: Family Medicine

## 2013-12-03 MED ORDER — NYSTATIN 100000 UNIT/ML MT SUSP: 5.0000 mL | Freq: Four times a day (QID) | OROMUCOSAL | Status: DC

## 2013-12-04 ENCOUNTER — Telehealth: Payer: Self-pay | Admitting: Internal Medicine

## 2013-12-04 NOTE — Telephone Encounter (Signed)
Message copied by Cline Crock on Wed Dec 04, 2013 11:18 AM ------      Message from: Lysbeth Penner      Created: Tue Dec 03, 2013  1:51 PM       Her funcal cx with reflex was positive so I sent in rx for nystatin swish and swallow and she can use this for a week ------

## 2013-12-04 NOTE — Telephone Encounter (Signed)
Message copied by Waverly Ferrari on Wed Dec 04, 2013 11:01 AM ------      Message from: Lysbeth Penner      Created: Tue Dec 03, 2013  1:51 PM       Her funcal cx with reflex was positive so I sent in rx for nystatin swish and swallow and she can use this for a week ------

## 2013-12-04 NOTE — Telephone Encounter (Signed)
Spoke with Alroy Bailiff in allergy lab-no vaccine has been mixed for patient as she continued to put off starting her vaccine. I will send message to CY so he is aware patient does not want to start vaccine at this time.   Pt is aware of the above and this is just an FYI for CY. Also, patient will keep upcoming OV with CY and discuss other options for allergy treatment.

## 2013-12-04 NOTE — Telephone Encounter (Signed)
noted 

## 2013-12-06 ENCOUNTER — Telehealth: Payer: Self-pay | Admitting: Family Medicine

## 2013-12-06 NOTE — Telephone Encounter (Signed)
appt given for tomorrow

## 2013-12-07 ENCOUNTER — Ambulatory Visit (INDEPENDENT_AMBULATORY_CARE_PROVIDER_SITE_OTHER): Payer: Medicare Other | Admitting: Nurse Practitioner

## 2013-12-07 VITALS — BP 105/66 | HR 71 | Temp 97.4°F | Ht 69.0 in | Wt 109.0 lb

## 2013-12-07 DIAGNOSIS — R05 Cough: Secondary | ICD-10-CM

## 2013-12-07 DIAGNOSIS — R059 Cough, unspecified: Secondary | ICD-10-CM

## 2013-12-07 DIAGNOSIS — N3289 Other specified disorders of bladder: Secondary | ICD-10-CM

## 2013-12-07 MED ORDER — KETOROLAC TROMETHAMINE 60 MG/2ML IM SOLN
60.0000 mg | Freq: Once | INTRAMUSCULAR | Status: AC
Start: 2013-12-07 — End: 2013-12-07
  Administered 2013-12-07: 60 mg via INTRAMUSCULAR

## 2013-12-07 NOTE — Patient Instructions (Signed)

## 2013-12-07 NOTE — Progress Notes (Signed)
   Subjective:    Patient ID: Kristy Garza, female    DOB: 04-16-1950, 63 y.o.   MRN: [id number]  HPI Patient in c/o cough that started several days ago and has gotten worse. She says that her teeth hurt and she has bladder pain from cystitis.    Review of Systems  Constitutional: Negative.  Negative for fever, chills, appetite change and unexpected weight change.  HENT: Positive for congestion. Negative for sinus pressure and sore throat.   Respiratory: Positive for cough (dry nonproductive).   Cardiovascular: Negative.   Genitourinary: Negative.   Musculoskeletal: Negative.   Neurological: Negative.   Psychiatric/Behavioral: Negative.        Objective:   Physical Exam  Constitutional: She is oriented to person, place, and time. She appears well-developed and well-nourished.  HENT:  Right Ear: Hearing, tympanic membrane, external ear and ear canal normal.  Left Ear: Hearing, tympanic membrane, external ear and ear canal normal.  Nose: Mucosal edema and rhinorrhea present. Right sinus exhibits no maxillary sinus tenderness and no frontal sinus tenderness. Left sinus exhibits no maxillary sinus tenderness and no frontal sinus tenderness.  Mouth/Throat: Uvula is midline, oropharynx is clear and moist and mucous membranes are normal.  Eyes: Pupils are equal, round, and reactive to light.  Neck: Normal range of motion. Neck supple.  Cardiovascular: Normal rate and normal heart sounds.   Pulmonary/Chest: Effort normal and breath sounds normal.  Dry cough  Neurological: She is alert and oriented to person, place, and time.  Skin: Skin is warm.  Psychiatric: She has a normal mood and affect. Her behavior is normal. Judgment and thought content normal.    BP 105/66  Pulse 71  Temp(Src) 97.4 F (36.3 C) (Oral)  Ht 5\' 9"  (1.753 m)  Wt 109 lb (49.442 kg)  BMI 16.09 kg/m2       Assessment & Plan:  1. Bladder spasms Force fluids- cranberry juice - ketorolac (TORADOL)  injection 60 mg; Inject 2 mLs (60 mg total) into the muscle once.  2. Cough 1. Take meds as prescribed 2. Use a cool mist humidifier especially during the winter months and when heat has been humid. 3. Use saline nose sprays frequently 4. Saline irrigations of the nose can be very helpful if done frequently.  * 4X daily for 1 week*  * Use of a nettie pot can be helpful with this. Follow directions with this* 5. Drink plenty of fluids 6. Keep thermostat turn down low 7.For any cough or congestion  Use plain Mucinex- regular strength or max strength is fine   * Children- consult with Pharmacist for dosing 8. For fever or aces or pains- take tylenol or ibuprofen appropriate for age and weight.  * for fevers greater than 101 orally you may alternate ibuprofen and tylenol every  3 hours.   Mary-Margaret Hassell Done, FNP

## 2013-12-09 ENCOUNTER — Telehealth: Payer: Self-pay | Admitting: Family Medicine

## 2013-12-11 NOTE — Telephone Encounter (Signed)
The pain management referral was her second opinion.

## 2013-12-11 NOTE — Telephone Encounter (Signed)
lmtcb

## 2013-12-13 ENCOUNTER — Ambulatory Visit: Payer: Self-pay

## 2013-12-16 ENCOUNTER — Other Ambulatory Visit: Payer: Self-pay | Admitting: *Deleted

## 2013-12-16 ENCOUNTER — Other Ambulatory Visit: Payer: Self-pay | Admitting: Nurse Practitioner

## 2013-12-16 MED ORDER — RALOXIFENE HCL 60 MG PO TABS: 60.0000 mg | ORAL_TABLET | Freq: Every day | ORAL | Status: DC

## 2013-12-25 ENCOUNTER — Encounter: Payer: Self-pay | Admitting: Internal Medicine

## 2013-12-25 ENCOUNTER — Ambulatory Visit (INDEPENDENT_AMBULATORY_CARE_PROVIDER_SITE_OTHER): Payer: Medicare Other | Admitting: Internal Medicine

## 2013-12-25 VITALS — BP 108/74 | HR 75 | Ht 69.0 in | Wt 109.4 lb

## 2013-12-25 DIAGNOSIS — Z23 Encounter for immunization: Secondary | ICD-10-CM

## 2013-12-25 DIAGNOSIS — J339 Nasal polyp, unspecified: Secondary | ICD-10-CM

## 2013-12-25 DIAGNOSIS — J310 Chronic rhinitis: Secondary | ICD-10-CM

## 2013-12-25 DIAGNOSIS — G500 Trigeminal neuralgia: Secondary | ICD-10-CM

## 2013-12-25 NOTE — Patient Instructions (Signed)
Order- refer to [facility] ENT  Dx chronic rhinitis, trigeminal neuralgia, hx of nasal polyps  Flu vax

## 2013-12-25 NOTE — Progress Notes (Signed)
12/28/12- 41 yoF never smoker-Dr Wong/ [facility]-last seen by [facility] 2009; seen at [facility] chest 2002; chronic cough  Has seen Dr. Lamonte Sakai here and Dr Neldon Mc in the past. Complains of allergies, asthma, chronic cough for over 20 years. Help with inhalers and cough medicines. Using rescue inhaler once or twice a day. Not using Qvar now. Particularly bad in spring and fall. [place] smells musty when it rains. She thinks in retrospect that allergy shots in the past with more than she realized. She had one episode while on allergy shots, when she had throat tightness which scared her so she quit.. Occasional cough at night. Previous PFTs are filed, but as recently as 2009 showed reversible obstructive airways disease and small airways. Singulair blamed for paresthesias. Son smokes outside the home. History of sinus surgery, vocal cord polyp. Father had allergies and asthma  02/07/13- 62 yoF never smoker-Dr Wong/ [facility]-last seen by [facility] 2009; seen at [facility] chest 2002; followed for chronic cough, allergic rhinitis  ACUTE VISIT:  Cough worsening over the past month.  Cough dry-persistant  cough worse in the past week. Losing weight attributed to poor appetite which she blames on acid indigestion despite pantoprazole/ elevated HOB. She's worried the chest x-ray suggests emphysema. Anoro blamed for chest pain. CXR 01/30/13 CLINICAL DATA: Cough.  EXAM:  CHEST 2 VIEW  COMPARISON: Chest radiograph 05/15/2012.  FINDINGS:  The cardiopericardial silhouette and mediastinal contours are within  normal limits. Hyperinflation and severe emphysema is present. No  airspace disease or pleural effusion is identified.  IMPRESSION:  Emphysema without acute cardiopulmonary disease.  Electronically Signed  By: Dereck Ligas M.D.  On: 01/30/2013 13:00  03/15/13- 62 yoF never smoker-Dr Wong/ [facility]-last seen by [facility] 2009; seen at [facility] chest 2002; followed for chronic cough, allergic rhinitis  ACUTE VISIT: Cough since 01/2013.  Saw her PCP and was given prednisone with no relief. Reports cough, ear pain, chest tightness and congestion. Had outpatient bladder surgery and has been coughing up blood since then. Sinus surgery summer 2014. Now 5 days of increased head congestion. New hemoptysis, daily, since bladder surgery 5 days ago during which she was intubated. Now on cephalexin for UTI. Notices frontal headache, ears full. Treating leg cramps with bananas and mustard a1AT level 02/07/13- WNL  MM 167. CXR 01/30/13 IMPRESSION:  Emphysema without acute cardiopulmonary disease.  Electronically Signed  By: Dereck Ligas M.D.  On: 01/30/2013 13:00  03/29/13- 62 yoF never smoker (father smoked)- followed for chronic cough, allergic rhinitis , COPD by CT Pt c/o sinus pressure, headaches, ear pain, prod cough with sometimes clear mucous  She had prednisone in early Dec for cough, then reported streak hemoptysis and had CT. Has finished Keflex and prednisone with no more hemoptysis Admits head and sinus pressure. Taking Sudafed.  Has followup appointment with ENT in [place]. Allergy profile 12/28/2012 negative with total IgE 4.5. a1AT 02/07/13- WNL MM, 167 CT chest 03/25/13 IMPRESSION:  1. No CT evidence of acute pulmonary embolism.  2. Emphysema. No acute cardiopulmonary process identified.  Electronically Signed  By: Jeannine Boga M.D.  On: 03/25/2013 13:31  05/02/13- 85 yoF never smoker (father smoked)- followed for chronic cough, allergic rhinitis , COPD by CT No antihistmines, OTC cough syrups, or sleep aids in past 3 days; Pt having continues to have chest congestion, cough,"lump in throat feeling", sore throat as well. Husband is currently sick as well; wonders if she should have test today. ENT in [facility] gave augmentin 875, 3x daily> diarrhea  after 4 doses, but she thought it had started to help. He is scheduling CT sinus.  She bought colloidal silver preparation at [facility], but her  accupuncturist told her not to take it. Friend recommended alternative practitioner who gives Vit C infusions.  Main c/o is globus sensation with cough, maxillary facial pressure. Husband is sick. We are deferring allergy testing.  CT maxfac 05/13/2011 IMPRESSION:  No paranasal sinuses mucosal thickening or air-fluid levels.  Midline nasal septum. Question prior surgery bilateral medial wall  maxillary sinus and right ethmoid air cells.  Original Report Authenticated By: Lahoma Crocker, M.D. QuickVue Flu swab 25/15- NEG  05/30/13- 27 yoF never smoker (father smoked)- followed for chronic cough, allergic rhinitis , COPD by CT FOLLOWS FOR:  c/o: sinus pressure and cough with clear thick mucus and ringing in ears still. Raspy throat, but much better than before. Taking cefdinir 300 mg once daily for tolerance- 3 more days. ENT said sinuses ok at [facility]. Asks depo- prefers over prednisone.  08/28/13- 29 yoF never smoker (father smoked)- followed for chronic cough, allergic rhinitis , COPD by CT(Nl a1AT) No antihistamines, OTC sleep aids in past 3 days. Pt had 2 teaspoons of Tussin DM at 11:00am this morning. Complains of facial pain, sensitive upper teeth. Told by ENT sinuses good, but Rx'd nasal mometasone and tobra. Dentist says tooth pain not from sinuses. Integrative medicine doctor in [place] did some Immuno[facility] tests but she isn't going to follow up there.Continues Qvar. 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 For allergy skin testing 08/28/13-Positive- grass, tree, weed, dust. She will decide about allergy shots. Dr Laurance Flatten could give once she is at maintenance.  10/25/13-  70 yoF never smoker (father smoked)- followed for chronic cough, allergic rhinitis , COPD by CT(Nl a1AT) FOLLOWS FOR: pt c/o teeth pain, headaches.  Pt is interested in starting allergy injections.   Went to a ENT in [place]-CT sinus showed questionable polyp in right ethmoid sinus versus mucus. Sinus culture "scant bacteria". She is using a  tobramycin nasal rinse and being treated for thrush. Chiropractor treated pinched nerve in neck Neurologist tried gabapentin and one other medication Dentist told her "teeth are not causing it"-feelings of pressure in her nose and sinuses. She wears a mouth guard at night. We had commented on obvious aware of her teeth before, and potential significance of bruxism. She is also seeing an herbalist who was treated her by putting seeds in her ear. She was interested in allergy vaccine before but did not want to have to drive here twice a week during the buildup. We discussed realistic expectations and we discussed availability of SLIT therapy for grass and ragweed.  12/25/13-  63 yoF never smoker (father smoked)- followed for chronic cough, allergic rhinitis , COPD by CT(Nl a1AT) FOLLOWS FOR: red eyes, slight nasal stuffiness in Right side. Did not start Allergy vaccine.    ROS-see HPI Constitutional:   No-   weight loss, night sweats, fevers, chills, fatigue, lassitude. HEENT:   + headaches, no-difficulty swallowing, +tooth/dental problems, +sore throat,       No-  sneezing, itching, ear ache, +nasal congestion, post nasal drip,  CV:  No-   chest pain, orthopnea, PND, swelling in lower extremities, anasarca, dizziness, palpitations Resp: No-   shortness of breath with exertion or at rest.              No-   productive cough,  + non-productive cough,  No- coughing up of blood.  No-   change in color of mucus.  No- wheezing.   Skin: No-   rash or lesions. GI:   No-heartburn, indigestion,  loss of appetite GU: . MS:  No-   joint pain or swelling.   Neuro-     nothing unusual Psych:  No- change in mood or affect. No depression or anxiety.  No memory loss.  OBJ- Physical Exam General- Alert, Oriented, Affect-appropriate, Distress- none acute, Trim. Sits hunched but can straighten. Skin- rash-none, lesions- none, excoriation- none Lymphadenopathy- none Head- atraumatic             Eyes- Gross vision intact, PERRLA, conjunctivae and secretions clear            Ears- Hearing, canals-normal            Nose- Clear, no-Septal dev, mucus, polyps, erosion, perforation             Throat- Mallampati II , mucosa+thrush , drainage- none, tonsils- atrophic.  +Much dental                          Repair, +throat clearing Neck- flexible , trachea midline, no stridor , thyroid nl, carotid no bruit Chest - symmetrical excursion , unlabored           Heart/CV- RRR , no murmur , no gallop  , no rub, nl s1 s2                           - JVD- none , edema- none, stasis changes- none, varices- none           Lung- clear to P&A, wheeze- none, cough-none , dullness-none, rub- none. Long/thin                           thorax.           Chest wall-  Abd-  Br/ Gen/ Rectal- Not done, not indicated Extrem- cyanosis- none, clubbing, none, atrophy- none, strength- nl Neuro- grossly intact to observation

## 2013-12-27 ENCOUNTER — Ambulatory Visit: Payer: Self-pay

## 2014-01-09 ENCOUNTER — Telehealth: Payer: Self-pay | Admitting: Family Medicine

## 2014-01-10 NOTE — Telephone Encounter (Signed)
Why does she need Toradol injection?

## 2014-01-11 ENCOUNTER — Encounter: Payer: Self-pay | Admitting: Family Medicine

## 2014-01-11 ENCOUNTER — Ambulatory Visit (INDEPENDENT_AMBULATORY_CARE_PROVIDER_SITE_OTHER): Payer: Medicare Other | Admitting: Family Medicine

## 2014-01-11 VITALS — BP 103/65 | HR 78 | Temp 96.8°F | Ht 69.0 in | Wt 109.0 lb

## 2014-01-11 DIAGNOSIS — K0889 Other specified disorders of teeth and supporting structures: Secondary | ICD-10-CM

## 2014-01-11 DIAGNOSIS — K088 Other specified disorders of teeth and supporting structures: Secondary | ICD-10-CM

## 2014-01-11 DIAGNOSIS — R519 Headache, unspecified: Secondary | ICD-10-CM

## 2014-01-11 DIAGNOSIS — R51 Headache: Secondary | ICD-10-CM

## 2014-01-11 MED ORDER — TRAMADOL HCL 50 MG PO TABS: ORAL_TABLET | ORAL | Status: DC

## 2014-01-11 NOTE — Progress Notes (Signed)
Subjective:    Patient ID: Kristy Garza, female    DOB: December 29, 1950, 63 y.o.   MRN: [id number]  HPI Patient here for jaw and upper tooth pain. 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  The patient has an appointment at the end of this month to see a neurologist in [place] whom she says does the gamma knife. She was referred there by another neurologist and has had this might help the constipation that she is having, trigeminal neuralgia. She also has a cough. She also has chronic constipation. She needs a refill on her medication for constipation.         Patient Active Problem List   Diagnosis Date Noted  . 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 Asthma, chronic 05/13/2013  . Diarrhea 05/13/2013  . Anxiety state, unspecified 05/13/2013  . Chronic rhinitis 05/13/2013  . Thrush 04/21/2013  . Hemoptysis, unspecified 03/23/2013  . Bronchitis, chronic obstructive w acute bronchitis 03/23/2013  . Leg cramps 03/23/2013  . Osteoporosis with pathological fracture 01/30/2013  . Pain in joint, ankle and foot 08/14/2012  . Toe contusion 08/14/2012  . Seasonal allergic rhinitis 07/07/2012  . Sinusitis nasal 07/07/2012  . G E R D 08/15/2007  . Cough 08/15/2007  . VOCAL CORD POLYP, HX OF 08/15/2007   Outpatient Encounter Prescriptions as of 01/11/2014  Medication Sig  . albuterol (PROVENTIL) (2.5 MG/3ML) 0.083% nebulizer solution Take 3 mLs (2.5 mg total) by nebulization every 6 (six) hours as needed for wheezing or shortness of breath.  . ALPRAZolam (XANAX) 0.5 MG tablet Take 1 tablet (0.5 mg total) by mouth 2 (two) times daily.  [name] azelastine (ASTELIN) 0.1 % nasal spray Place 1 spray into both nostrils 2 (two) times daily. Use in each nostril as directed  . benzonatate (TESSALON) 200 MG capsule TAKE 1-2 CAPSULES (100-200 MG TOTAL) BY MOUTH 3 (THREE) TIMES DAILY AS NEEDED FOR COUGH.  . calcium citrate-vitamin D (CITRACAL+D) 315-200 MG-UNIT per tablet Take 1  tablet by mouth.  . Cholecalciferol (VITAMIN D PO) Take 5,000 Units by mouth daily.   [name] ELMIRON 100 MG capsule Take 200 mg by mouth 2 (two) times daily.   . fluticasone (FLONASE) 50 MCG/ACT nasal spray   . guaiFENesin (MUCINEX) 600 MG 12 hr tablet Take 600 mg by mouth 2 (two) times daily.  [name] levETIRAcetam (KEPPRA) 500 MG tablet Take 500 mg by mouth.  . lidocaine (XYLOCAINE) 2 % jelly Apply topically as needed.  . Linaclotide (LINZESS) 290 MCG CAPS capsule Take 290 mcg by mouth every other day.  . Nutritional Supplements (DHEA PO) Take 5 mg by mouth daily.   . Omega-3 Fatty Acids (FISH OIL) 1000 MG CAPS Take 1 capsule by mouth 2 (two) times daily.   [name] OVER THE COUNTER MEDICATION Cysta-Q 1 tablet bid  . pantoprazole (PROTONIX) 40 MG tablet Take 40 mg by mouth daily.  . polyethylene glycol (MIRALAX / GLYCOLAX) packet Take 17 g by mouth daily as needed.  . raloxifene (EVISTA) 60 MG tablet Take 1 tablet (60 mg total) by mouth daily.  . [DISCONTINUED] Cranberry-Vitamin C-Probiotic (AZO CRANBERRY) 250-30 MG TABS Take 2 tablets by mouth 2 (two) times daily.  . [DISCONTINUED] OVER THE COUNTER MEDICATION Similase 1 tablet before each meal  . [DISCONTINUED] Probiotic Product (ALIGN) 4 MG CAPS Take 1 capsule by mouth daily.  . [DISCONTINUED] cyclobenzaprine (FLEXERIL) 5 MG tablet Take 5 mg by mouth 3 (three) times daily as needed for muscle spasms. Take 1/2 to  1 tablet as needed up to 3 times a day as needed  . [DISCONTINUED] Ferrous Sulfate (IRON) 142 (45 FE) MG TBCR Take by mouth.  . [DISCONTINUED] OVER THE COUNTER MEDICATION Iodine complex 12.5mg  capsules - 1 capsule daily    Review of Systems  Constitutional: Negative.   HENT: Negative.        Upper teeth pain and jaw pain  Eyes: Negative.   Respiratory: Negative.   Cardiovascular: Negative.   Gastrointestinal: Negative.   Endocrine: Negative.   Genitourinary: Negative.   Musculoskeletal: Negative.   Skin: Negative.   Allergic/Immunologic:  Negative.   Neurological: Negative.   Hematological: Negative.   Psychiatric/Behavioral: Negative.        Objective:   Physical Exam  Nursing note and vitals reviewed. Constitutional: She is oriented to person, place, and time. No distress.  CN and somewhat frail female who looks older than her stated age.  HENT:  Head: Normocephalic and atraumatic.  Right Ear: External ear normal.  Left Ear: External ear normal.  Nose: Nose normal.  Mouth/Throat: Oropharynx is clear and moist. No oropharyngeal exudate.  Oral cavity and the incision appeared to be normal.  Eyes: Conjunctivae and EOM are normal. Pupils are equal, round, and reactive to light. Right eye exhibits no discharge. Left eye exhibits no discharge. No scleral icterus.  Neck: Normal range of motion. Neck supple. No JVD present. No thyromegaly present.   No anterior cervical adenopathy.  Cardiovascular: Normal rate, regular rhythm and normal heart sounds.   No murmur heard. Pulmonary/Chest: Effort normal and breath sounds normal. No respiratory distress. She has no wheezes. She has no rales. She exhibits no tenderness.  No wheezes minimal congestion with coughing  Abdominal: Soft. She exhibits no mass. There is no tenderness. There is no rebound and no guarding.  Musculoskeletal: Normal range of motion. She exhibits no edema.  Lymphadenopathy:    She has no cervical adenopathy.  Neurological: She is alert and oriented to person, place, and time. She has normal reflexes.  Skin: Skin is warm and dry. No rash noted.  Psychiatric: Her behavior is normal. Judgment and thought content normal.  Mood and affect are somewhat flat   BP 103/65  Pulse 78  Temp(Src) 96.8 F (36 C) (Oral)  Ht 5\' 9"  (1.753 m)  Wt 109 lb (49.442 kg)  BMI 16.09 kg/m2        Assessment & Plan:  1. Facial pain - traMADol (ULTRAM) 50 MG tablet; One half to one by mouth every 8-12 hours as needed for severe pain  Dispense: 30 tablet; Refill: 0  2.  Pain, dental - traMADol (ULTRAM) 50 MG tablet; One half to one by mouth every 8-12 hours as needed for severe pain  Dispense: 30 tablet; Refill: 0  Patient Instructions  Drink plenty of fluids Take Mucinex maximum strength, blue and white in color, over-the-counter, 1 twice daily for cough and congestion with a large glass of water Follow through with the appointment with the neurologist the end of the month Take pain medication only as needed for severe pain one half to one by mouth and no more than 2 or 3 times daily.   Arrie Senate MD

## 2014-01-11 NOTE — Patient Instructions (Signed)
Drink plenty of fluids Take Mucinex maximum strength, blue and white in color, over-the-counter, 1 twice daily for cough and congestion with a large glass of water Follow through with the appointment with the neurologist the end of the month Take pain medication only as needed for severe pain one half to one by mouth and no more than 2 or 3 times daily.

## 2014-01-13 NOTE — Telephone Encounter (Signed)
Patient was given a prescription on 01/11/14 for oral pain medication.  She will follow up if needed.

## 2014-01-22 DIAGNOSIS — R519 Headache, unspecified: Secondary | ICD-10-CM | POA: Insufficient documentation

## 2014-01-22 DIAGNOSIS — R51 Headache: Secondary | ICD-10-CM

## 2014-02-07 ENCOUNTER — Ambulatory Visit: Payer: Medicare Other

## 2014-02-08 ENCOUNTER — Telehealth: Payer: Self-pay | Admitting: Family Medicine

## 2014-02-08 NOTE — Telephone Encounter (Signed)
Pt notified to keep seeing specialist Verbalizes understanding

## 2014-02-10 ENCOUNTER — Other Ambulatory Visit: Payer: Self-pay | Admitting: Nurse Practitioner

## 2014-02-14 ENCOUNTER — Ambulatory Visit (INDEPENDENT_AMBULATORY_CARE_PROVIDER_SITE_OTHER): Payer: Medicare Other | Admitting: Pharmacist

## 2014-02-14 ENCOUNTER — Encounter: Payer: Self-pay | Admitting: Pharmacist

## 2014-02-14 VITALS — BP 110/70 | HR 84 | Ht 69.0 in | Wt 109.5 lb

## 2014-02-14 DIAGNOSIS — Z13220 Encounter for screening for lipoid disorders: Secondary | ICD-10-CM

## 2014-02-14 DIAGNOSIS — Z79899 Other long term (current) drug therapy: Secondary | ICD-10-CM

## 2014-02-14 DIAGNOSIS — R5382 Chronic fatigue, unspecified: Secondary | ICD-10-CM

## 2014-02-14 DIAGNOSIS — M810 Age-related osteoporosis without current pathological fracture: Secondary | ICD-10-CM

## 2014-02-14 DIAGNOSIS — D649 Anemia, unspecified: Secondary | ICD-10-CM

## 2014-02-14 NOTE — Progress Notes (Signed)
CC:  Weight loss and medication management  HPI:  Patient comes today to recheck weight loss and wanting a review of current medications.  Patient is under weight but has gained about 0.5# since our last visit in August 2015.  She states that she in enrolled in a study currently and is receiving Ensure BID.  Patient also has concern about evista / raloxifene and if this is the best medication to treat osteoporosis.  Her niece who is a nurse stated that it could cause bone cancer.  In past patient has not been able to tolerate oral bisphosphonates.  She has declined both prolia and reclast due to concerns with side effects. She tried Forteo but has bone pain.  She has a sister on Forteo.  She has recently seen pulmonologist, allergist, neurologist and integrative physician regarding chronic conditions such as persistent cough, possible GERD, chronic fatigue and pain.   Current medications include:  Outpatient Encounter Prescriptions as of 02/14/2014  Medication Sig Note  . albuterol (PROVENTIL) (2.5 MG/3ML) 0.083% nebulizer solution Take 3 mLs (2.5 mg total) by nebulization every 6 (six) hours as needed for wheezing or shortness of breath.   [name] azelastine (ASTELIN) 0.1 % nasal spray Place 1 spray into both nostrils 2 (two) times daily. Use in each nostril as directed   . beclomethasone (QVAR) 40 MCG/ACT inhaler Inhale 1 puff into the lungs 2 (two) times daily.   . benzonatate (TESSALON) 200 MG capsule TAKE 1-2 CAPSULES (100-200 MG TOTAL) BY MOUTH 3 (THREE) TIMES DAILY AS NEEDED FOR COUGH.   . calcium citrate-vitamin D (CITRACAL+D) 315-200 MG-UNIT per tablet Take 1 tablet by mouth. 12/07/2013: Received from: [facility]  . Calcium Glycerophosphate (PRELIEF PO) Take 1 tablet by mouth as needed (prior to acid containing foods).   . Cholecalciferol (VITAMIN D PO) Take 4,000 Units by mouth daily.    [name] DEXILANT 60 MG capsule Take 1 capsule by mouth every morning. 02/14/2014: Received from: External  Pharmacy Received Sig:   . ELMIRON 100 MG capsule Take 200 mg by mouth 2 (two) times daily.  11/06/2013: Received from: External Pharmacy  . fluticasone (FLONASE) 50 MCG/ACT nasal spray Place 2 sprays into both nostrils daily.  11/06/2013: Received from: External Pharmacy  . guaiFENesin (MUCINEX) 600 MG 12 hr tablet Take 600 mg by mouth 2 (two) times daily.   [name] lidocaine (XYLOCAINE) 2 % jelly Apply topically as needed. 01/30/2013: Received from: External Pharmacy Received Sig:   . Linaclotide (LINZESS) 290 MCG CAPS capsule Take 290 mcg by mouth every other day.   . Nutritional Supplements (DHEA PO) Take 5 mg by mouth daily.    . Omega-3 Fatty Acids (FISH OIL) 1000 MG CAPS Take 1 capsule by mouth 2 (two) times daily.    . pantoprazole (PROTONIX) 40 MG tablet Take 40 mg by mouth daily.   . Pediatric Multiple Vitamins (THERA MULTI-VITAMIN PO) Take 1 tablet by mouth daily.   . polyethylene glycol (MIRALAX / GLYCOLAX) packet Take 17 g by mouth daily as needed.   . raloxifene (EVISTA) 60 MG tablet Take one po qd   . traMADol (ULTRAM) 50 MG tablet One half to one by mouth every 8-12 hours as needed for severe pain   . [DISCONTINUED] ALPRAZolam (XANAX) 0.5 MG tablet Take 1 tablet (0.5 mg total) by mouth 2 (two) times daily.   . [DISCONTINUED] levETIRAcetam (KEPPRA) 500 MG tablet Take 500 mg by mouth. 01/11/2014: Received from: [place]  . [DISCONTINUED] OVER THE COUNTER MEDICATION  Cysta-Q 1 tablet bid      Assessment: Weight loss / cachexia Medication management -  H/o elevated B12 - need recheck since she has stopped multiple b12 supplements from last visit Osteoporosis - severe - history of intolerance to oral bisphos. Elevated calcium - on last BMP   Plan: 1.  Reviewed ways in increase weight - increase lean proteins - nuts, eggs, peanut butter and fish, vegetable and fruits, whole grains.  Gave recipe for strawberry, banana peanut butter smoothie.  Eat smaller, more frequent meals.   2.   Recommended exercise - silver sneakers once weekly to start to help increase lean body mass and build strength 3.   Orders Placed This Encounter  Procedures  . Anemia Profile B  . CMP14+EGFR  . Lipid panel  . LDL Cholesterol, Direct  . DHEA-sulfate  . Iodine, Serum/Plasma    4.  Reviewed treatment options for osteoporosis - will wait until calcium level returns.  Consider - Reclast or Forteo.  Cherre Robins, PharmD, CPP   Had planned to do Medicare Wellness Visit but determined that nurse has come to patient's home in Summer of 2015 to do Wellness Visit.

## 2014-02-15 ENCOUNTER — Other Ambulatory Visit: Payer: Self-pay | Admitting: Family

## 2014-02-17 ENCOUNTER — Telehealth: Payer: Self-pay | Admitting: Nurse Practitioner

## 2014-02-17 ENCOUNTER — Encounter: Payer: Self-pay | Admitting: Nurse Practitioner

## 2014-02-17 ENCOUNTER — Ambulatory Visit (INDEPENDENT_AMBULATORY_CARE_PROVIDER_SITE_OTHER): Payer: Medicare Other | Admitting: Nurse Practitioner

## 2014-02-17 VITALS — BP 112/75 | HR 78 | Temp 97.1°F | Ht 69.0 in | Wt 113.0 lb

## 2014-02-17 DIAGNOSIS — G500 Trigeminal neuralgia: Secondary | ICD-10-CM

## 2014-02-17 DIAGNOSIS — R059 Cough, unspecified: Secondary | ICD-10-CM

## 2014-02-17 DIAGNOSIS — R05 Cough: Secondary | ICD-10-CM

## 2014-02-17 MED ORDER — KETOROLAC TROMETHAMINE 60 MG/2ML IM SOLN
60.0000 mg | Freq: Once | INTRAMUSCULAR | Status: AC
  Administered 2014-02-17: 60 mg via INTRAMUSCULAR

## 2014-02-17 NOTE — Progress Notes (Signed)
   Subjective:    Patient ID: Kristy Garza, female    DOB: Dec 23, 1950, 63 y.o.   MRN: [id number]  HPI Kristy Garza in today c/o cough- said that she coughed up a greenish mucus plug yesterday- denies fever- She also called on call person yesterday c/o pain from her trigeminal neuralgia and wants a tordaol injection which usually helps with pain. But when she came in she did not mention it to the nurse.    Review of Systems  Constitutional: Negative.   HENT: Negative.   Respiratory: Positive for cough.   Cardiovascular: Negative.   Gastrointestinal: Negative.   Neurological: Negative.   Psychiatric/Behavioral: Negative.   All other systems reviewed and are negative.      Objective:   Physical Exam  Constitutional: She is oriented to person, place, and time. She appears well-developed and well-nourished.  HENT:  Pain bil jaw area from her trigeminal neuralgia  Cardiovascular: Normal rate, regular rhythm and normal heart sounds.   Pulmonary/Chest: Effort normal and breath sounds normal.  Neurological: She is alert and oriented to person, place, and time.  Skin: Skin is warm and dry.  Psychiatric: She has a normal mood and affect. Her behavior is normal. Judgment and thought content normal.   BP 112/75 mmHg  Pulse 78  Temp(Src) 97.1 F (36.2 C) (Oral)  Ht 5\' 9"  (1.753 m)  Wt 113 lb (51.256 kg)  BMI 16.68 kg/m2        Assessment & Plan:  1. Trigeminal neuralgia Keep appointment with neurosurgeon - ketorolac (TORADOL) injection 60 mg; Inject 2 mLs (60 mg total) into the muscle once.  2. Cough Force fluids Rest Tessalon perles as needed RTO prn  Mary-Margaret Hassell Done, FNP

## 2014-02-17 NOTE — Patient Instructions (Signed)
Cough, Adult  A cough is a reflex that helps clear your throat and airways. It can help heal the body or may be a reaction to an irritated airway. A cough may only last 2 or 3 weeks (acute) or may last more than 8 weeks (chronic).  CAUSES Acute cough:  Viral or bacterial infections. Chronic cough:  Infections.  Allergies.  Asthma.  Post-nasal drip.  Smoking.  Heartburn or acid reflux.  Some medicines.  Chronic lung problems (COPD).  Cancer. SYMPTOMS   Cough.  Fever.  Chest pain.  Increased breathing rate.  High-pitched whistling sound when breathing (wheezing).  Colored mucus that you cough up (sputum). TREATMENT   A bacterial cough may be treated with antibiotic medicine.  A viral cough must run its course and will not respond to antibiotics.  Your caregiver may recommend other treatments if you have a chronic cough. HOME CARE INSTRUCTIONS   Only take over-the-counter or prescription medicines for pain, discomfort, or fever as directed by your caregiver. Use cough suppressants only as directed by your caregiver.  Use a cold steam vaporizer or humidifier in your bedroom or home to help loosen secretions.  Sleep in a semi-upright position if your cough is worse at night.  Rest as needed.  Stop smoking if you smoke. SEEK IMMEDIATE MEDICAL CARE IF:   You have pus in your sputum.  Your cough starts to worsen.  You cannot control your cough with suppressants and are losing sleep.  You begin coughing up blood.  You have difficulty breathing.  You develop pain which is getting worse or is uncontrolled with medicine.  You have a fever. MAKE SURE YOU:   Understand these instructions.  Will watch your condition.  Will get help right away if you are not doing well or get worse. Document Released: 09/10/2010 Document Revised: 06/06/2011 Document Reviewed: 09/10/2010 Exit[employer] Patient Information 2015 [employer]. This information is not intended  to replace advice given to you by your health care provider. Make sure you discuss any questions you have with your health care provider.  

## 2014-02-17 NOTE — Telephone Encounter (Signed)
Approved per MMM.

## 2014-02-17 NOTE — Telephone Encounter (Signed)
Aware, no lab results yet.

## 2014-02-18 LAB — CMP14+EGFR
ALT: 14 IU/L (ref 0–32)
AST: 19 IU/L (ref 0–40)
Albumin/Globulin Ratio: 1.8 (ref 1.1–2.5)
Albumin: 4.2 g/dL (ref 3.6–4.8)
Alkaline Phosphatase: 77 IU/L (ref 39–117)
BUN/Creatinine Ratio: 17 (ref 11–26)
BUN: 13 mg/dL (ref 8–27)
CALCIUM: 9.6 mg/dL (ref 8.7–10.3)
CHLORIDE: 102 mmol/L (ref 97–108)
CO2: 22 mmol/L (ref 18–29)
Creatinine, Ser: 0.75 mg/dL (ref 0.57–1.00)
GFR calc Af Amer: 98 mL/min/{1.73_m2} (ref >59)
GFR calc non Af Amer: 85 mL/min/{1.73_m2} (ref >59)
GLUCOSE: 89 mg/dL (ref 65–99)
Globulin, Total: 2.3 g/dL (ref 1.5–4.5)
POTASSIUM: 4.4 mmol/L (ref 3.5–5.2)
Sodium: 140 mmol/L (ref 134–144)
Total Bilirubin: 0.3 mg/dL (ref 0.0–1.2)
Total Protein: 6.5 g/dL (ref 6.0–8.5)

## 2014-02-18 LAB — ANEMIA PROFILE B
BASOS ABS: 0 10*3/uL (ref 0.0–0.2)
Basos: 0 %
Eos: 2 %
Eosinophils Absolute: 0.1 10*3/uL (ref 0.0–0.4)
FERRITIN: 45 ng/mL (ref 15–150)
HCT: 34.7 % (ref 34.0–46.6)
Hemoglobin: 11.4 g/dL (ref 11.1–15.9)
IMMATURE GRANULOCYTES: 0 %
IRON SATURATION: 10 % — AB (ref 15–55)
IRON: 31 ug/dL — AB (ref 35–155)
Immature Grans (Abs): 0 10*3/uL (ref 0.0–0.1)
LYMPHS: 21 %
Lymphocytes Absolute: 0.8 10*3/uL (ref 0.7–3.1)
MCH: 30.5 pg (ref 26.6–33.0)
MCHC: 32.9 g/dL (ref 31.5–35.7)
MCV: 93 fL (ref 79–97)
MONOCYTES: 9 %
Monocytes Absolute: 0.3 10*3/uL (ref 0.1–0.9)
NEUTROS PCT: 68 %
Neutrophils Absolute: 2.5 10*3/uL (ref 1.4–7.0)
PLATELETS: 195 10*3/uL (ref 150–379)
RBC: 3.74 x10E6/uL — ABNORMAL LOW (ref 3.77–5.28)
RDW: 12.2 % — AB (ref 12.3–15.4)
RETIC CT PCT: 0.7 % (ref 0.6–2.6)
TIBC: 300 ug/dL (ref 250–450)
UIBC: 269 ug/dL (ref 150–375)
VITAMIN B 12: 555 pg/mL (ref 211–946)
WBC: 3.7 10*3/uL (ref 3.4–10.8)

## 2014-02-18 LAB — IODINE, SERUM/PLASMA: Iodine: 59.1 ug/L (ref 40.0–92.0)

## 2014-02-18 LAB — DHEA-SULFATE: DHEA-SO4: 48.5 ug/dL (ref 29.4–220.5)

## 2014-02-18 LAB — LIPID PANEL
Chol/HDL Ratio: 2.9 ratio units (ref 0.0–4.4)
Cholesterol, Total: 149 mg/dL (ref 100–199)
HDL: 51 mg/dL (ref >39)
LDL Calculated: 88 mg/dL (ref 0–99)
Triglycerides: 51 mg/dL (ref 0–149)
VLDL Cholesterol Cal: 10 mg/dL (ref 5–40)

## 2014-02-18 LAB — LDL CHOLESTEROL, DIRECT: LDL Direct: 99 mg/dL (ref 0–99)

## 2014-02-19 ENCOUNTER — Telehealth: Payer: Self-pay | Admitting: Pharmacist

## 2014-02-19 NOTE — Telephone Encounter (Signed)
Stp advised provider hasn't reviewed labs yet, but at a quick glance all results look stable but we will call her back to review results in further detail once reviewed, pt voiced understanding, will close encounter.

## 2014-02-21 ENCOUNTER — Telehealth: Payer: Self-pay | Admitting: Nurse Practitioner

## 2014-02-21 NOTE — Telephone Encounter (Signed)
Patient was told does to need to wait to take topamax- can start back now.

## 2014-02-24 NOTE — Telephone Encounter (Signed)
Patient requesting results of lab work

## 2014-02-26 NOTE — Telephone Encounter (Signed)
Patient's labs were WNL except iron, iron saturation and RDW were low - Hgb WNL and HCT WNL.  Patient is taking MVI but no iron supplements.  Last colonoscopy 2009.  Looks like no Hemocult performed within last year.  Discussed with Dietrich Pates who has seen her previously. He suggested referral to Hematologist.  Will leave Hemocult cards - if positive then refer to GI, if negative refer to Hematologist. Patient notified and Hemocult left at front desk.

## 2014-03-06 ENCOUNTER — Telehealth: Payer: Self-pay | Admitting: Internal Medicine

## 2014-03-06 ENCOUNTER — Telehealth: Payer: Self-pay | Admitting: Family Medicine

## 2014-03-06 MED ORDER — PROMETHAZINE-CODEINE 6.25-10 MG/5ML PO SYRP: 5.0000 mL | ORAL_SOLUTION | Freq: Four times a day (QID) | ORAL | Status: DC | PRN

## 2014-03-06 MED ORDER — ALBUTEROL SULFATE HFA 108 (90 BASE) MCG/ACT IN AERS: 2.0000 | INHALATION_SPRAY | RESPIRATORY_TRACT | Status: DC | PRN

## 2014-03-06 NOTE — Telephone Encounter (Signed)
Ok to send prometh codeine 200 ml, 5 ml every 6 hours if needed for cough Ok to [employer] # 1, 2 puffs every 4 hours as needed- rescue   Ref prn

## 2014-03-06 NOTE — Telephone Encounter (Signed)
Pt informed of Dr Janee Morn recommendations.  Rx sent.

## 2014-03-06 NOTE — Telephone Encounter (Signed)
Spoke with pt. States that [employer] aren't helping her cough. Cough has been present for more than a week. Reports headache. Denies fever, SOB or chest tightness. Would like something else sent in. Also wants refill on ProAir but I don't see that on her medication list.  Allergies  Allergen Reactions  . Augmentin [Amoxicillin-Pot Clavulanate] Diarrhea  . Avelox [Moxifloxacin Hcl In Nacl] Other (See Comments)    Tremors   . Biaxin [Clarithromycin]     Unsure of reaction  . Cedax [Ceftibuten] Other (See Comments)    tremors  . Ciprofloxacin Other (See Comments)    Blurred vision  . Clindamycin/Lincomycin     tachycardia  . Doxycycline Diarrhea  . Gabapentin     Constipation  . Levofloxacin Other (See Comments)    Feels dehydrated  . Lyrica [Pregabalin]     Drowsiness, dry mouth  . Singulair [Montelukast Sodium] Other (See Comments)    Numbness and tingling in hand.  . Sulfonamide Derivatives Other (See Comments)    Gi upset   CY - please advise. Thanks.

## 2014-03-06 NOTE — Telephone Encounter (Signed)
Patient wanted to know how we test for yeast. Patient advised we do wet preps which is a vaginal swab. Patient was also told that she could try monistat otc to see if that will help. She states if symptoms do not improve she will call in for an appointment.

## 2014-03-08 ENCOUNTER — Ambulatory Visit (INDEPENDENT_AMBULATORY_CARE_PROVIDER_SITE_OTHER): Payer: Medicare Other | Admitting: Nurse Practitioner

## 2014-03-08 ENCOUNTER — Encounter: Payer: Self-pay | Admitting: Nurse Practitioner

## 2014-03-08 VITALS — BP 104/62 | HR 86 | Temp 97.7°F | Ht 69.0 in | Wt 110.6 lb

## 2014-03-08 DIAGNOSIS — R3989 Other symptoms and signs involving the genitourinary system: Secondary | ICD-10-CM

## 2014-03-08 DIAGNOSIS — R39198 Other difficulties with micturition: Secondary | ICD-10-CM

## 2014-03-08 DIAGNOSIS — B3749 Other urogenital candidiasis: Secondary | ICD-10-CM

## 2014-03-08 DIAGNOSIS — J440 Chronic obstructive pulmonary disease with acute lower respiratory infection: Secondary | ICD-10-CM

## 2014-03-08 LAB — POCT URINALYSIS DIPSTICK
Bilirubin, UA: NEGATIVE
Blood, UA: NEGATIVE
GLUCOSE UA: NEGATIVE
Ketones, UA: NEGATIVE
Leukocytes, UA: NEGATIVE
Nitrite, UA: NEGATIVE
Protein, UA: NEGATIVE
Urobilinogen, UA: NEGATIVE
pH, UA: 5

## 2014-03-08 LAB — POCT UA - MICROSCOPIC ONLY
BACTERIA, U MICROSCOPIC: NEGATIVE
Casts, Ur, LPF, POC: NEGATIVE
Crystals, Ur, HPF, POC: NEGATIVE
MUCUS UA: NEGATIVE
RBC, URINE, MICROSCOPIC: NEGATIVE
WBC, UR, HPF, POC: NEGATIVE

## 2014-03-08 MED ORDER — BUDESONIDE-FORMOTEROL FUMARATE 80-4.5 MCG/ACT IN AERO: 2.0000 | INHALATION_SPRAY | Freq: Two times a day (BID) | RESPIRATORY_TRACT | Status: DC

## 2014-03-08 MED ORDER — FLUCONAZOLE 150 MG PO TABS: 150.0000 mg | ORAL_TABLET | Freq: Once | ORAL | Status: DC

## 2014-03-08 NOTE — Progress Notes (Signed)
   Subjective:    Patient ID: Kristy Garza, female    DOB: 1950-12-12, 63 y.o.   MRN: [id number]  HPI Patient in today with 2 complaints: - cough no better- has COPD and is on inhalers- currently on qvar which is not helping- uses albuterol inhaler 2x a day. - patient says she has perineal itching and burning- has interstitual cystitis.    Review of Systems  Constitutional: Negative for fever, chills and appetite change.  HENT: Negative for congestion.   Respiratory: Positive for cough.   Genitourinary: Positive for dysuria and urgency.  Neurological: Negative.   Psychiatric/Behavioral: Negative.   All other systems reviewed and are negative.      Objective:   Physical Exam  Constitutional: She is oriented to person, place, and time. She appears well-developed and well-nourished.  HENT:  Right Ear: External ear normal.  Left Ear: External ear normal.  Nose: Nose normal.  Mouth/Throat: Oropharynx is clear and moist.  Eyes: Pupils are equal, round, and reactive to light.  Neck: Normal range of motion.  Cardiovascular: Normal rate, regular rhythm and normal heart sounds.   Pulmonary/Chest: Breath sounds normal. No respiratory distress. She has no wheezes. She has no rales. She exhibits no tenderness.  Dry hacky cough  Lymphadenopathy:    She has no cervical adenopathy.  Neurological: She is alert and oriented to person, place, and time.  Skin: Skin is warm.  Psychiatric: She has a normal mood and affect. Her behavior is normal. Judgment and thought content normal.   BP 104/62 mmHg  Pulse 86  Temp(Src) 97.7 F (36.5 C) (Oral)  Ht 5\' 9"  (1.753 m)  Wt 110 lb 9.6 oz (50.168 kg)  BMI 16.33 kg/m2  Results for orders placed or performed in visit on 03/08/14  POCT urinalysis dipstick  Result Value Ref Range   Color, UA yellow    Clarity, UA clear    Glucose, UA neg    Bilirubin, UA neg    Ketones, UA neg    Spec Grav, UA <=1.005    Blood, UA neg    pH, UA 5.0    Protein, UA neg    Urobilinogen, UA negative    Nitrite, UA neg    Leukocytes, UA Negative   POCT UA - Microscopic Only  Result Value Ref Range   WBC, Ur, HPF, POC neg    RBC, urine, microscopic neg    Bacteria, U Microscopic neg    Mucus, UA neg    Epithelial cells, urine per micros occ    Crystals, Ur, HPF, POC neg    Casts, Ur, LPF, POC neg           Assessment & Plan:  1. Difficulty in voiding - POCT urinalysis dipstick - POCT UA - Microscopic Only  2. Candidiasis of urogenital site - fluconazole (DIFLUCAN) 150 MG tablet; Take 1 tablet (150 mg total) by mouth once.  Dispense: 1 tablet; Refill: 0  3. Bronchitis, chronic obstructive w acute bronchitis Only use albuterol inhaler as needed - budesonide-formoterol (SYMBICORT) 80-4.5 MCG/ACT inhaler; Inhale 2 puffs into the lungs 2 (two) times daily.  Dispense: 1 Inhaler; Refill: [place], FNP

## 2014-03-11 ENCOUNTER — Other Ambulatory Visit: Payer: Medicare Other

## 2014-03-11 DIAGNOSIS — Z1212 Encounter for screening for malignant neoplasm of rectum: Secondary | ICD-10-CM

## 2014-03-11 NOTE — Progress Notes (Signed)
Lab only 

## 2014-03-13 LAB — FECAL OCCULT BLOOD, IMMUNOCHEMICAL: Fecal Occult Bld: NEGATIVE

## 2014-03-14 ENCOUNTER — Telehealth: Payer: Self-pay

## 2014-03-14 NOTE — Telephone Encounter (Signed)
-----   Message from Chevis Pretty, [place] sent at 03/13/2014  5:09 PM EST ----- hemocult negative

## 2014-03-14 NOTE — Telephone Encounter (Signed)
Left results on am as per DPR of 10/2013

## 2014-03-22 ENCOUNTER — Other Ambulatory Visit: Payer: Self-pay | Admitting: Nurse Practitioner

## 2014-03-22 MED ORDER — AMOXICILLIN 875 MG PO TABS: 875.0000 mg | ORAL_TABLET | Freq: Two times a day (BID) | ORAL | Status: DC

## 2014-03-31 DIAGNOSIS — R350 Frequency of micturition: Secondary | ICD-10-CM | POA: Diagnosis not present

## 2014-03-31 DIAGNOSIS — N3010 Interstitial cystitis (chronic) without hematuria: Secondary | ICD-10-CM | POA: Diagnosis not present

## 2014-04-01 ENCOUNTER — Other Ambulatory Visit: Payer: Self-pay

## 2014-04-01 MED ORDER — BECLOMETHASONE DIPROPIONATE 40 MCG/ACT IN AERS: 2.0000 | INHALATION_SPRAY | Freq: Two times a day (BID) | RESPIRATORY_TRACT | Status: DC

## 2014-04-01 NOTE — Telephone Encounter (Signed)
Last seen 03/08/14 MMM  Qvar is not on patients med list

## 2014-04-02 DIAGNOSIS — M5030 Other cervical disc degeneration, unspecified cervical region: Secondary | ICD-10-CM | POA: Diagnosis not present

## 2014-04-02 DIAGNOSIS — M5136 Other intervertebral disc degeneration, lumbar region: Secondary | ICD-10-CM | POA: Diagnosis not present

## 2014-04-02 DIAGNOSIS — M9901 Segmental and somatic dysfunction of cervical region: Secondary | ICD-10-CM | POA: Diagnosis not present

## 2014-04-02 DIAGNOSIS — M5408 Panniculitis affecting regions of neck and back, sacral and sacrococcygeal region: Secondary | ICD-10-CM | POA: Diagnosis not present

## 2014-04-02 DIAGNOSIS — M5416 Radiculopathy, lumbar region: Secondary | ICD-10-CM | POA: Diagnosis not present

## 2014-04-02 DIAGNOSIS — M5382 Other specified dorsopathies, cervical region: Secondary | ICD-10-CM | POA: Diagnosis not present

## 2014-04-02 DIAGNOSIS — M9903 Segmental and somatic dysfunction of lumbar region: Secondary | ICD-10-CM | POA: Diagnosis not present

## 2014-04-02 DIAGNOSIS — M9902 Segmental and somatic dysfunction of thoracic region: Secondary | ICD-10-CM | POA: Diagnosis not present

## 2014-04-12 ENCOUNTER — Telehealth: Payer: Self-pay | Admitting: Nurse Practitioner

## 2014-04-12 NOTE — Telephone Encounter (Signed)
Patients last toradol shot was 11/23.

## 2014-04-14 ENCOUNTER — Telehealth: Payer: Self-pay | Admitting: Nurse Practitioner

## 2014-04-14 ENCOUNTER — Telehealth: Payer: Self-pay | Admitting: Family Medicine

## 2014-04-14 NOTE — Telephone Encounter (Signed)
Patient aware we do not have any available appointments left for today and she said she will try back in the am.

## 2014-04-14 NOTE — Telephone Encounter (Signed)
Appointment scheduled.

## 2014-04-15 ENCOUNTER — Encounter: Payer: Self-pay | Admitting: Family Medicine

## 2014-04-15 ENCOUNTER — Ambulatory Visit (INDEPENDENT_AMBULATORY_CARE_PROVIDER_SITE_OTHER): Payer: Medicare Other | Admitting: Family Medicine

## 2014-04-15 VITALS — BP 104/65 | HR 79 | Temp 96.7°F | Ht 69.0 in | Wt 111.0 lb

## 2014-04-15 DIAGNOSIS — G500 Trigeminal neuralgia: Secondary | ICD-10-CM

## 2014-04-15 MED ORDER — KETOROLAC TROMETHAMINE 30 MG/ML IJ SOLN
30.0000 mg | Freq: Once | INTRAMUSCULAR | Status: AC
  Administered 2014-04-15: 30 mg via INTRAMUSCULAR

## 2014-04-15 NOTE — Progress Notes (Signed)
   Subjective:    Patient ID: Kristy Garza, female    DOB: 01/12/1951, 64 y.o.   MRN: [id number]  HPI C/o gum and teeth pain.  She states this is from her trigeminal neuralgia.  She states she has seen the neurologist and they do not help.  She sees pain management and she states this does not help.  She states nothing is helping with her gum and teeth pain due to trigeminal neuralgia.  Review of Systems  Constitutional: Negative for fever.  HENT: Negative for ear pain.   Eyes: Negative for discharge.  Respiratory: Negative for cough.   Cardiovascular: Negative for chest pain.  Gastrointestinal: Negative for abdominal distention.  Endocrine: Negative for polyuria.  Genitourinary: Negative for difficulty urinating.  Musculoskeletal: Negative for gait problem and neck pain.  Skin: Negative for color change and rash.  Neurological: Negative for speech difficulty and headaches.  Psychiatric/Behavioral: Negative for agitation.       Objective:    BP 104/65 mmHg  Pulse 79  Temp(Src) 96.7 F (35.9 C) (Oral)  Ht 5\' 9"  (1.753 m)  Wt 111 lb (50.349 kg)  BMI 16.38 kg/m2 Physical Exam  Constitutional: She is oriented to person, place, and time. She appears well-developed and well-nourished.  HENT:  Head: Normocephalic and atraumatic.  Mouth/Throat: Oropharynx is clear and moist.  Eyes: Pupils are equal, round, and reactive to light.  Neck: Normal range of motion. Neck supple.  Cardiovascular: Normal rate and regular rhythm.   No murmur heard. Pulmonary/Chest: Effort normal and breath sounds normal.  Abdominal: Soft. Bowel sounds are normal. There is no tenderness.  Neurological: She is alert and oriented to person, place, and time.  Skin: Skin is warm and dry.  Psychiatric: She has a normal mood and affect.          Assessment & Plan:     ICD-9-CM ICD-10-CM   1. Trigeminal neuralgia 350.1 G50.0 Ambulatory referral to Neurology     ketorolac (TORADOL) 30 MG/ML injection  30 mg     Return if symptoms worsen or fail to improve.  Lysbeth Penner FNP

## 2014-04-17 ENCOUNTER — Ambulatory Visit (INDEPENDENT_AMBULATORY_CARE_PROVIDER_SITE_OTHER): Payer: Medicare Other | Admitting: Family

## 2014-04-17 ENCOUNTER — Encounter: Payer: Self-pay | Admitting: Family

## 2014-04-17 ENCOUNTER — Telehealth: Payer: Self-pay | Admitting: Family

## 2014-04-17 VITALS — BP 102/70 | HR 103 | Temp 97.0°F | Ht 69.0 in | Wt 112.0 lb

## 2014-04-17 DIAGNOSIS — G500 Trigeminal neuralgia: Secondary | ICD-10-CM | POA: Diagnosis not present

## 2014-04-17 DIAGNOSIS — R059 Cough, unspecified: Secondary | ICD-10-CM

## 2014-04-17 DIAGNOSIS — J329 Chronic sinusitis, unspecified: Secondary | ICD-10-CM

## 2014-04-17 DIAGNOSIS — R05 Cough: Secondary | ICD-10-CM

## 2014-04-17 MED ORDER — METHYLPREDNISOLONE ACETATE 40 MG/ML IJ SUSP
40.0000 mg | Freq: Once | INTRAMUSCULAR | Status: AC
  Administered 2014-04-17: 40 mg via INTRAMUSCULAR

## 2014-04-17 NOTE — Progress Notes (Signed)
   Subjective:    Patient ID: Kristy Garza, female    DOB: [birth year], 64 y.o.   MRN: [id number]  HPI Pt presents to the office for chronic gum and teeth pain. Pt has been diagnosed with neuralgia in the past. Pt states she had a gammaknife procedure in December, which helped her lower jaw pain. Pt states she is still having the upper jaw pain. Pt states she will have another procedure in 4 months. Pt states she needs something to help her with her pain. Pt states she has a urologist appointment on Monday for a procedure for cystitis. Pt wants to discuss getting a steroid shot today.     Review of Systems  Constitutional: Negative.   HENT: Negative.   Eyes: Negative.   Respiratory: Positive for cough. Negative for shortness of breath.   Cardiovascular: Negative.  Negative for palpitations.  Gastrointestinal: Negative.   Endocrine: Negative.   Genitourinary: Negative.   Musculoskeletal: Negative.   Neurological: Negative.  Negative for headaches.  Hematological: Negative.   Psychiatric/Behavioral: Negative.   All other systems reviewed and are negative.      Objective:   Physical Exam  Constitutional: She is oriented to person, place, and time. She appears well-developed and well-nourished. No distress.  HENT:  Head: Normocephalic and atraumatic.  Right Ear: External ear normal.  Left Ear: External ear normal.  Mouth/Throat: Oropharynx is clear and moist.  Eyes: Pupils are equal, round, and reactive to light.  Neck: Normal range of motion. Neck supple. No thyromegaly present.  Cardiovascular: Normal rate, regular rhythm, normal heart sounds and intact distal pulses.   No murmur heard. Pulmonary/Chest: Effort normal and breath sounds normal. No respiratory distress. She has no wheezes.  Constant Dry nonproductive cough   Abdominal: Soft. Bowel sounds are normal. She exhibits no distension. There is no tenderness.  Musculoskeletal: Normal range of motion. She exhibits no edema  or tenderness.  Neurological: She is alert and oriented to person, place, and time.  Skin: Skin is warm and dry.  Psychiatric: She has a normal mood and affect. Her behavior is normal. Judgment and thought content normal.  Vitals reviewed.   BP 102/70 mmHg  Pulse 103  Temp(Src) 97 F (36.1 C) (Oral)  Ht 5\' 9"  (1.753 m)  Wt 112 lb (50.803 kg)  BMI 16.53 kg/m2       Assessment & Plan:  1. Chronic sinusitis, unspecified location - methylPREDNISolone acetate (DEPO-MEDROL) injection 40 mg; Inject 1 mL (40 mg total) into the muscle once.  2. Cough  3. Trigeminal neuralgia -Discussed risks of steroids and bone loss- PT states she wants just a low dose -Pain management discussed -Pt needs to keep ALL appointment wit specialists - methylPREDNISolone acetate (DEPO-MEDROL) injection 40 mg; Inject 1 mL (40 mg total) into the muscle once.  Evelina Dun, FNP

## 2014-04-17 NOTE — Patient Instructions (Signed)
Trigeminal Neuralgia  Trigeminal neuralgia is a nerve disorder that causes sudden attacks of severe facial pain. It is caused by damage to the trigeminal nerve, a major nerve in the face. It is more common in women and in the elderly, although it can also happen in younger patients. Attacks last from a few seconds to several minutes and can occur from a couple of times per year to several times per day. Trigeminal neuralgia can be a very distressing and disabling condition. Surgery may be needed in very severe cases if medical treatment does not give relief.  HOME CARE INSTRUCTIONS    If your caregiver prescribed medication to help prevent attacks, take as directed.   To help prevent attacks:   Chew on the unaffected side of the mouth.   Avoid touching your face.   Avoid blasts of hot or cold air.   Men may wish to grow a beard to avoid having to shave.  SEEK IMMEDIATE MEDICAL CARE IF:   Pain is unbearable and your medicine does not help.   You develop new, unexplained symptoms (problems).   You have problems that may be related to a medication you are taking.  Document Released: 03/11/2000 Document Revised: 06/06/2011 Document Reviewed: 01/09/2009  ExitCare Patient Information 2015 [employer]. This information is not intended to replace advice given to you by your health care provider. Make sure you discuss any questions you have with your health care provider.

## 2014-04-21 DIAGNOSIS — N3010 Interstitial cystitis (chronic) without hematuria: Secondary | ICD-10-CM | POA: Diagnosis not present

## 2014-04-21 DIAGNOSIS — M1990 Unspecified osteoarthritis, unspecified site: Secondary | ICD-10-CM | POA: Diagnosis not present

## 2014-04-21 DIAGNOSIS — R350 Frequency of micturition: Secondary | ICD-10-CM | POA: Diagnosis not present

## 2014-04-21 DIAGNOSIS — R3915 Urgency of urination: Secondary | ICD-10-CM | POA: Diagnosis not present

## 2014-04-21 DIAGNOSIS — M791 Myalgia: Secondary | ICD-10-CM | POA: Diagnosis not present

## 2014-04-21 DIAGNOSIS — I7300 Raynaud's syndrome without gangrene: Secondary | ICD-10-CM | POA: Diagnosis not present

## 2014-04-21 DIAGNOSIS — J45909 Unspecified asthma, uncomplicated: Secondary | ICD-10-CM | POA: Diagnosis not present

## 2014-04-21 DIAGNOSIS — G500 Trigeminal neuralgia: Secondary | ICD-10-CM | POA: Diagnosis not present

## 2014-04-21 DIAGNOSIS — G629 Polyneuropathy, unspecified: Secondary | ICD-10-CM | POA: Diagnosis not present

## 2014-04-21 DIAGNOSIS — K219 Gastro-esophageal reflux disease without esophagitis: Secondary | ICD-10-CM | POA: Diagnosis not present

## 2014-04-21 DIAGNOSIS — M810 Age-related osteoporosis without current pathological fracture: Secondary | ICD-10-CM | POA: Diagnosis not present

## 2014-04-21 NOTE — Telephone Encounter (Signed)
Patient aware.

## 2014-04-21 NOTE — Telephone Encounter (Signed)
Pt needs to follow up with pain clinic or other specialist. Nothing else can be ordered at this time.

## 2014-04-29 ENCOUNTER — Encounter: Payer: Self-pay | Admitting: Internal Medicine

## 2014-04-29 ENCOUNTER — Ambulatory Visit (INDEPENDENT_AMBULATORY_CARE_PROVIDER_SITE_OTHER): Payer: Medicare Other | Admitting: Internal Medicine

## 2014-04-29 VITALS — BP 100/72 | HR 87 | Ht 69.0 in | Wt 110.0 lb

## 2014-04-29 DIAGNOSIS — G4763 Sleep related bruxism: Secondary | ICD-10-CM | POA: Diagnosis not present

## 2014-04-29 DIAGNOSIS — J45909 Unspecified asthma, uncomplicated: Secondary | ICD-10-CM | POA: Diagnosis not present

## 2014-04-29 DIAGNOSIS — J440 Chronic obstructive pulmonary disease with acute lower respiratory infection: Secondary | ICD-10-CM

## 2014-04-29 DIAGNOSIS — K219 Gastro-esophageal reflux disease without esophagitis: Secondary | ICD-10-CM | POA: Diagnosis not present

## 2014-04-29 DIAGNOSIS — J302 Other seasonal allergic rhinitis: Secondary | ICD-10-CM | POA: Diagnosis not present

## 2014-04-29 MED ORDER — METHYLPREDNISOLONE ACETATE 80 MG/ML IJ SUSP
40.0000 mg | Freq: Once | INTRAMUSCULAR | Status: AC
Start: 2014-04-29 — End: 2014-04-29
  Administered 2014-04-29: 40 mg via INTRAMUSCULAR

## 2014-04-29 NOTE — Progress Notes (Signed)
12/28/12- 70 yoF never smoker-Dr Wong/ [facility]-last seen by [place] 2009; seen at [facility] chest 2002; chronic cough  Has seen Dr. Lamonte Sakai here and Dr Neldon Mc in the past. Complains of allergies, asthma, chronic cough for over 20 years. Help with inhalers and cough medicines. Using rescue inhaler once or twice a day. Not using Qvar now. Particularly bad in spring and fall. [place] smells musty when it rains. She thinks in retrospect that allergy shots in the past with more than she realized. She had one episode while on allergy shots, when she had throat tightness which scared her so she quit.. Occasional cough at night. Previous PFTs are filed, but as recently as 2009 showed reversible obstructive airways disease and small airways. Singulair blamed for paresthesias. Son smokes outside the home. History of sinus surgery, vocal cord polyp. Father had allergies and asthma  02/07/13- 62 yoF never smoker-Dr Wong/ [facility]-last seen by [place] 2009; seen at [facility] chest 2002; followed for chronic cough, allergic rhinitis  ACUTE VISIT:  Cough worsening over the past month.  Cough dry-persistant  cough worse in the past week. Losing weight attributed to poor appetite which she blames on acid indigestion despite pantoprazole/ elevated HOB. She's worried the chest x-ray suggests emphysema. Anoro blamed for chest pain. CXR 01/30/13 CLINICAL DATA: Cough.  EXAM:  CHEST 2 VIEW  COMPARISON: Chest radiograph 05/15/2012.  FINDINGS:  The cardiopericardial silhouette and mediastinal contours are within  normal limits. Hyperinflation and severe emphysema is present. No  airspace disease or pleural effusion is identified.  IMPRESSION:  Emphysema without acute cardiopulmonary disease.  Electronically Signed  By: Dereck Ligas M.D.  On: 01/30/2013 13:00  03/15/13- 62 yoF never smoker-Dr Wong/ [facility]-last seen by [place] 2009; seen at [facility] chest 2002; followed for chronic cough, allergic rhinitis  ACUTE VISIT: Cough since 01/2013.  Saw her PCP and was given prednisone with no relief. Reports cough, ear pain, chest tightness and congestion. Had outpatient bladder surgery and has been coughing up blood since then. Sinus surgery summer 2014. Now 5 days of increased head congestion. New hemoptysis, daily, since bladder surgery 5 days ago during which she was intubated. Now on cephalexin for UTI. Notices frontal headache, ears full. Treating leg cramps with bananas and mustard a1AT level 02/07/13- WNL  MM 167. CXR 01/30/13 IMPRESSION:  Emphysema without acute cardiopulmonary disease.  Electronically Signed  By: Dereck Ligas M.D.  On: 01/30/2013 13:00  03/29/13- 62 yoF never smoker (father smoked)- followed for chronic cough, allergic rhinitis , COPD by CT Pt c/o sinus pressure, headaches, ear pain, prod cough with sometimes clear mucous  She had prednisone in early Dec for cough, then reported streak hemoptysis and had CT. Has finished Keflex and prednisone with no more hemoptysis Admits head and sinus pressure. Taking Sudafed.  Has followup appointment with ENT in [place]. Allergy profile 12/28/2012 negative with total IgE 4.5. a1AT 02/07/13- WNL MM, 167 CT chest 03/25/13 IMPRESSION:  1. No CT evidence of acute pulmonary embolism.  2. Emphysema. No acute cardiopulmonary process identified.  Electronically Signed  By: Jeannine Boga M.D.  On: 03/25/2013 13:31  05/02/13- 30 yoF never smoker (father smoked)- followed for chronic cough, allergic rhinitis , COPD by CT No antihistmines, OTC cough syrups, or sleep aids in past 3 days; Pt having continues to have chest congestion, cough,"lump in throat feeling", sore throat as well. Husband is currently sick as well; wonders if she should have test today. ENT in [facility] gave augmentin 875, 3x daily> diarrhea  after 4 doses, but she thought it had started to help. He is scheduling CT sinus.  She bought colloidal silver preparation at [employer] store, but her  accupuncturist told her not to take it. Friend recommended alternative practitioner who gives Vit C infusions.  Main c/o is globus sensation with cough, maxillary facial pressure. Husband is sick. We are deferring allergy testing.  CT maxfac 05/13/2011 IMPRESSION:  No paranasal sinuses mucosal thickening or air-fluid levels.  Midline nasal septum. Question prior surgery bilateral medial wall  maxillary sinus and right ethmoid air cells.  Original Report Authenticated By: Lahoma Crocker, M.D. QuickVue Flu swab 25/15- NEG  05/30/13- 27 yoF never smoker (father smoked)- followed for chronic cough, allergic rhinitis , COPD by CT FOLLOWS FOR:  c/o: sinus pressure and cough with clear thick mucus and ringing in ears still. Raspy throat, but much better than before. Taking cefdinir 300 mg once daily for tolerance- 3 more days. ENT said sinuses ok at [facility]. Asks depo- prefers over prednisone.  08/28/13- 59 yoF never smoker (father smoked)- followed for chronic cough, allergic rhinitis , COPD by CT(Nl a1AT) No antihistamines, OTC sleep aids in past 3 days. Pt had 2 teaspoons of Tussin DM at 11:00am this morning. Complains of facial pain, sensitive upper teeth. Told by ENT sinuses good, but Rx'd nasal mometasone and tobra. Dentist says tooth pain not from sinuses. Integrative medicine doctor in [place] did some [facility] tests but she isn't going to follow up there.Continues Qvar. 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 For allergy skin testing 08/28/13-Positive- grass, tree, weed, dust. She will decide about allergy shots. Dr Laurance Flatten could give once she is at maintenance.  10/25/13-  21 yoF never smoker (father smoked)- followed for chronic cough, allergic rhinitis , COPD by CT(Nl a1AT) FOLLOWS FOR: pt c/o teeth pain, headaches.  Pt is interested in starting allergy injections.   Went to a ENT in [place]-CT sinus showed questionable polyp in right ethmoid sinus versus mucus. Sinus culture "scant bacteria". She is using a  tobramycin nasal rinse and being treated for thrush. Chiropractor treated pinched nerve in neck Neurologist tried gabapentin and one other medication Dentist told her "teeth are not causing it"-feelings of pressure in her nose and sinuses. She wears a mouth guard at night. We had commented on obvious aware of her teeth before, and potential significance of bruxism. She is also seeing an herbalist who was treated her by putting seeds in her ear. She was interested in allergy vaccine before but did not want to have to drive here twice a week during the buildup. We discussed realistic expectations and we discussed availability of SLIT therapy for grass and ragweed.  12/25/13-  63 yoF never smoker (father smoked)- followed for chronic cough, allergic rhinitis , COPD by CT(Nl a1AT) FOLLOWS FOR: red eyes, slight nasal stuffiness in Right side. Did not start Allergy vaccine.   04/29/14- 51 yoF never smoker (father smoked)- followed for chronic cough, allergic rhinitis , COPD by CT(Nl a1AT) FOLLOWS FOR: since last visit got sick and was put on Amoxicillin-cleared up. Has had several other treatments and meds as well due to Trigeminal neuralgia. Continues to have facial pain. Has trouble with her cough as well-has noticed Tessalon pearls made her drowsy-so she changed to Delsym cough syrup. H on the right and is pending repeat on left side of face. 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 Still some cough in the mornings. PCP started Symbicort 80. Taking ensure for weight loss.  ROS-see HPI Constitutional:   No-   weight loss, night sweats, fevers, chills, fatigue, lassitude. HEENT:   + headaches, no-difficulty swallowing, +tooth/dental problems, +sore throat,       No-  sneezing, itching, ear ache, +nasal congestion, post nasal drip,  CV:   No-   chest pain, orthopnea, PND, swelling in lower extremities, anasarca, dizziness, palpitations Resp: No-   shortness of breath with exertion or at rest.              No-   productive cough,  + non-productive cough,  No- coughing up of blood.              No-   change in color of mucus.  No- wheezing.   Skin: No-   rash or lesions. GI:   No-heartburn, indigestion,  loss of appetite GU: . MS:  No-   joint pain or swelling.   Neuro-     nothing unusual Psych:  No- change in mood or affect. No depression or anxiety.  No memory loss.  OBJ- Physical Exam General- Alert, Oriented, Affect-appropriate, Distress- none acute, +thin. Sits hunched but can straighten. Skin- rash-none, lesions- none, excoriation- none Lymphadenopathy- none Head- atraumatic            Eyes- Gross vision intact, PERRLA, conjunctivae and secretions clear            Ears- Hearing, canals-normal            Nose- Clear, no-Septal dev, mucus, polyps, erosion, perforation             Throat- Mallampati II , mucosa+thrush , drainage- none, tonsils- atrophic.  +Much dental Repair/ teeth are worn, +throat clearing Neck- flexible , trachea midline, no stridor , thyroid nl, carotid no bruit Chest - symmetrical excursion , unlabored           Heart/CV- RRR , no murmur , no gallop  , no rub, nl s1 s2                           - JVD- none , edema- none, stasis changes- none, varices- none           Lung- clear to P&A, wheeze- none, cough-none , dullness-none, rub- none. Long/thin  thorax.           Chest wall-  Abd-  Br/ Gen/ Rectal- Not done, not indicated Extrem- cyanosis- none, clubbing, none, atrophy- none, strength- nl Neuro- grossly intact to observation

## 2014-04-29 NOTE — Patient Instructions (Signed)
Ok to continue Symbicort 80  2 puffs then rinse mouth, twice daily  We want to minimize throat clearing to let yur throat calm down.  Try otc throat lozenges like Ricola and Hall's  Try cranberry juice to cut phlegm in throat  Suggest an otc mouth piece to prevent bruxism/ tooth grinding in your sleep

## 2014-04-30 ENCOUNTER — Telehealth: Payer: Self-pay | Admitting: Family

## 2014-05-06 NOTE — Telephone Encounter (Signed)
Spoke with patient and advised that she had 40mg  both times and that a lot of patients get 80mg  at a time.

## 2014-05-07 ENCOUNTER — Ambulatory Visit: Payer: Self-pay | Admitting: Neurology

## 2014-05-07 ENCOUNTER — Encounter: Payer: Self-pay | Admitting: Neurology

## 2014-05-07 ENCOUNTER — Other Ambulatory Visit: Payer: Self-pay | Admitting: Family Medicine

## 2014-05-07 ENCOUNTER — Ambulatory Visit (INDEPENDENT_AMBULATORY_CARE_PROVIDER_SITE_OTHER): Payer: Medicare Other | Admitting: Neurology

## 2014-05-07 VITALS — BP 118/66 | HR 80 | Temp 97.7°F | Resp 18 | Ht 69.0 in | Wt 109.3 lb

## 2014-05-07 DIAGNOSIS — G501 Atypical facial pain: Secondary | ICD-10-CM | POA: Diagnosis not present

## 2014-05-07 NOTE — Progress Notes (Signed)
NEUROLOGY CONSULTATION NOTE  Kristy Garza MRN: [id number] DOB: 1950/09/03  Referring provider: Stevan Born Primary care provider: Evelina Dun  Reason for consult:  Trigeminal neuralgia  HISTORY OF PRESENT ILLNESS: Kristy Garza is a 64 year old right-handed woman with asthma, osteoporosis and tinnitus who presents for trigeminal neuralgia.  Records from prior neurologist, MRI reports and EMG report reviewed.  For at least a year, she has had feeling of pressure in her head and teeth.  It is a deep pressure like pain that involves both sides of her face.  There is no paroxysmal electric quality to the pain.  There is no associated allodynia or trigger related to eating or brushing her teeth.  There is no associated numbness or tingling.  It was originally thought to be due to frequent sinus infections and she has undergone several rounds of steroids and antibiotics.  However, she continued to have symptoms.  She has been managed by both neurology and pain specialists.  She had an MRI of the brain performed on 06/25/13 which reportedly showed "few small scattered T2 hyperintensities, which are likely within normal limits for age."    She has tried several medications which were discontinued due to side effects, including carbamazepine, gabapentin, topiramate, Cymbalta, Keppra, hydrocodone and ibuprofen 800mg .  The hydrocodone was beneficial and topiramate was briefly beneficial until she had to stop it due to palpitations.  She underwent ganglion blocks and acupuncture.  In December, she had left sided gamma knife.  It provided some mild relief but nothing significant.  She notes numbness on the left side of her head since the procedure.  She was told that if she had any significant improvement, that she can have the right side done too.  She currently takes tramadol and ibuprofen.  She did see a dentist, who found nothing wrong.  She does report that she grinds her teeth.  PAST MEDICAL  HISTORY: Past Medical History  Diagnosis Date  . Asthma   . Osteoporosis   . Acid reflux   . PONV (postoperative nausea and vomiting)   . Tinnitus   . Allergy   . Interstitial cystitis   . Trigeminal neuralgia     Atypical trigeminal neuralgia    PAST SURGICAL HISTORY: Past Surgical History  Procedure Laterality Date  . Abdominal hysterectomy  2000  . 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 Ethmoidectomy  2012  . Septoplasty  1980's  . Cystoscopy with hydrodistension and biopsy N/A 06/11/2012    Procedure: CYSTOSCOPY/BIOPSY/HYDRODISTENSION with Instillation of Pyridium and Marcaine and Kenalog;  Surgeon: Ailene Rud, MD;  Location: [facility];  Service: Urology;  Laterality: N/A;  1 hour requested for this case  BLADDER BIOPSY    MEDICATIONS: Current Outpatient Prescriptions on File Prior to Visit  Medication Sig Dispense Refill  . albuterol (PROVENTIL HFA;VENTOLIN HFA) 108 (90 BASE) MCG/ACT inhaler Inhale 2 puffs into the lungs every 4 (four) hours as needed for wheezing or shortness of breath. 1 Inhaler prn  . albuterol (PROVENTIL) (2.5 MG/3ML) 0.083% nebulizer solution Take 3 mLs (2.5 mg total) by nebulization every 6 (six) hours as needed for wheezing or shortness of breath. 150 mL 1  . azelastine (ASTELIN) 0.1 % nasal spray Place 1 spray into both nostrils 2 (two) times daily. Use in each nostril as directed    . budesonide-formoterol (SYMBICORT) 80-4.5 MCG/ACT inhaler Inhale 2 puffs into the lungs 2 (two) times daily. 1 Inhaler 3  .  calcium citrate-vitamin D (CITRACAL+D) 315-200 MG-UNIT per tablet Take 1 tablet by mouth.    . Calcium Glycerophosphate (PRELIEF PO) Take 1 tablet by mouth as needed (prior to acid containing foods).    . Cholecalciferol (VITAMIN D PO) Take 4,000 Units by mouth daily.     [name] ELMIRON 100 MG capsule Take 200 mg by mouth 2 (two) times daily.     . fluticasone (FLONASE) 50 MCG/ACT nasal spray Place 2 sprays into both  nostrils daily.     [name] guaiFENesin (MUCINEX) 600 MG 12 hr tablet Take 600 mg by mouth 2 (two) times daily.    [name] lidocaine (XYLOCAINE) 2 % jelly Apply topically as needed.    [name] LINZESS 290 MCG CAPS capsule TAKE 1 CAPSULE (290 MCG TOTAL) BY MOUTH DAILY. 30 capsule 2  . Multiple Vitamin (MULTIVITAMIN) tablet Take 1 tablet by mouth daily.    . Nutritional Supplements (DHEA PO) Take 5 mg by mouth daily.     . Omega-3 Fatty Acids (FISH OIL) 1000 MG CAPS Take 1 capsule by mouth 2 (two) times daily.     . pantoprazole (PROTONIX) 40 MG tablet Take 40 mg by mouth daily.    . polyethylene glycol (MIRALAX / GLYCOLAX) packet Take 17 g by mouth daily as needed.    . raloxifene (EVISTA) 60 MG tablet Take one po qd 30 tablet 2  . traMADol (ULTRAM) 50 MG tablet One half to one by mouth every 8-12 hours as needed for severe pain 30 tablet 0   Current Facility-Administered Medications on File Prior to Visit  Medication Dose Route Frequency Provider Last Rate Last Dose  . bupivacaine (MARCAINE) 0.5 % 10 mL, triamcinolone acetonide (KENALOG-40) 40 mg injection   Subcutaneous Once Ailene Rud, MD      . bupivacaine (MARCAINE) 0.5 % 15 mL, phenazopyridine (PYRIDIUM) 400 mg bladder mixture   Bladder Instillation Once Ailene Rud, MD        ALLERGIES: Allergies  Allergen Reactions  . Augmentin [Amoxicillin-Pot Clavulanate] Diarrhea  . Avelox [Moxifloxacin Hcl In Nacl] Other (See Comments)    Tremors   . Biaxin [Clarithromycin]     Unsure of reaction  . Cedax [Ceftibuten] Other (See Comments)    tremors  . Ciprofloxacin Other (See Comments)    Blurred vision  . Clindamycin/Lincomycin     tachycardia  . Doxycycline Diarrhea  . Gabapentin     Constipation  . Levofloxacin Other (See Comments)    Feels dehydrated  . Lyrica [Pregabalin]     Drowsiness, dry mouth  . Singulair [Montelukast Sodium] Other (See Comments)    Numbness and tingling in hand.  . Sulfonamide Derivatives Other (See  Comments)    Gi upset    FAMILY HISTORY: Family History  Problem Relation Age of Onset  . Hyperlipidemia Mother   . Cancer Father   . Allergies Father     SOCIAL HISTORY: History   Social History  . Marital Status: Married    Spouse Name: N/A  . Number of Children: N/A  . Years of Education: N/A   Occupational History  . unemployeed    Social History Main Topics  . Smoking status: Never Smoker   . Smokeless tobacco: Never Used  . Alcohol Use: No  . Drug Use: No  . Sexual Activity: No   Other Topics Concern  . Not on file   Social History Narrative    REVIEW OF SYSTEMS: Constitutional: No fevers, chills, or sweats, no generalized fatigue,  change in appetite Eyes: No visual changes, double vision, eye pain Ear, nose and throat: No hearing loss, ear pain, nasal congestion, sore throat Cardiovascular: No chest pain, palpitations Respiratory:  No shortness of breath at rest or with exertion, wheezes GastrointestinaI: No nausea, vomiting, diarrhea, abdominal pain, fecal incontinence Genitourinary:  No dysuria, urinary retention or frequency Musculoskeletal:  Neck pain Integumentary: No rash, pruritus, skin lesions Neurological: as above Psychiatric: No depression, insomnia, anxiety Endocrine: No palpitations, fatigue, diaphoresis, mood swings, change in appetite, change in weight, increased thirst Hematologic/Lymphatic:  No anemia, purpura, petechiae. Allergic/Immunologic: no itchy/runny eyes, nasal congestion, recent allergic reactions, rashes  PHYSICAL EXAM: Filed Vitals:   05/07/14 1408  BP: 118/66  Pulse: 80  Temp: 97.7 F (36.5 C)  Resp: 18   General: No acute distress Head:  Normocephalic/atraumatic.  Tenderness to palpation and crepitus palpated on the TMJs bilaterally. Eyes:  fundi unremarkable, without vessel changes, exudates, hemorrhages or papilledema. Neck: supple, bilateral paraspinal tenderness, full range of motion Back: No paraspinal  tenderness Heart: regular rate and rhythm Lungs: Clear to auscultation bilaterally. Vascular: No carotid bruits. Neurological Exam: Mental status: alert and oriented to person, place, and time, recent and remote memory intact, fund of knowledge intact, attention and concentration intact, speech fluent and not dysarthric, language intact. Cranial nerves: CN I: not tested CN II: pupils equal, round and reactive to light, visual fields intact, fundi unremarkable, without vessel changes, exudates, hemorrhages or papilledema. CN III, IV, VI:  full range of motion, no nystagmus, no ptosis CN V: facial sensation intact CN VII: upper and lower face symmetric CN VIII: hearing intact CN IX, X: gag intact, uvula midline CN XI: sternocleidomastoid and trapezius muscles intact CN XII: tongue midline Bulk & Tone: normal, no fasciculations. Motor:  5/5 throughout Sensation:  Reduced vibration in the toes.  Pinprick sensation intact Deep Tendon Reflexes:  2+ throughout, toes downgoing Finger to nose testing:  No dysmetria Heel to shin:  No dysmetria Gait:  Normal station and stride.  Able to turn and walk in tandem. Romberg negative.  IMPRESSION: Bilateral atypical facial pain.  It really doesn't sound like trigeminal neuralgia to me.  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  PLAN: 1.  I recommend evaluation with a periodontist. 2.  I think she may benefit from baclofen, as it is an antispasmodic agent and also used to treat trigeminal neuralgia.  She said she has some but it caused drowsiness with 10mg .  I recommend starting with 5mg  daily and titrating up every week by 5mg  to initial goal of 5mg  three times daily.  Ultimately, other options include Trileptal, Lamictal or nortriptyline. 3.  Follow up in 3 months.  Thank you for allowing me to take part in the care of this patient.  Metta Clines, DO  CC: Stevan Born, Burt, FNP

## 2014-05-07 NOTE — Patient Instructions (Signed)
I'm not really convinced you have trigeminal neuralgia.  It may be related to the temporomandibular joint on both sides of your jaw.  I would like you to be evaluated by the periodontist. In the meantime, I would like you to still try the baclofen, because it is helpful for both trigeminal neuralgia and muscle spasms.  Take 1/2 tablet daily for 7 days, then 1/2 tablet twice daily for 7 days, then 1/2 tablet three times daily.  Call with questions or concerns.  Follow up in 3 months.

## 2014-05-13 NOTE — Assessment & Plan Note (Addendum)
She has chosen not to start allergy vaccine because of cost and logistics. We again discussed antihistamines and nasal sprays. Plan Depo-Medrol at her request

## 2014-05-13 NOTE — Assessment & Plan Note (Signed)
We have discussed potential for postnasal drainage and reflux to cause her persistent throat clearing. I passed on the suggestion from another patient that she try sipping cranberry juice in the morning to see if that helps.

## 2014-05-13 NOTE — Assessment & Plan Note (Signed)
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 I suggested OTC bruxism guard, which shouldn't interfere with what her other caregivers are doing.

## 2014-05-13 NOTE — Assessment & Plan Note (Signed)
Plan-Depo-Medrol, continue Symbicort

## 2014-05-16 ENCOUNTER — Telehealth: Payer: Self-pay | Admitting: Internal Medicine

## 2014-05-16 MED ORDER — AMOXICILLIN 500 MG PO CAPS: 500.0000 mg | ORAL_CAPSULE | Freq: Three times a day (TID) | ORAL | Status: DC

## 2014-05-16 NOTE — Telephone Encounter (Signed)
Called and spoke with pt and she is aware of rx that has been sent to her pharmacy for the amox 500.  Pt is aware and nothing further is needed.

## 2014-05-16 NOTE — Telephone Encounter (Signed)
Ok to Rx amox 500 mg, # 21,  One 3 times daily x 7 days

## 2014-05-16 NOTE — Telephone Encounter (Signed)
lmtcb for pt.  

## 2014-05-16 NOTE — Telephone Encounter (Signed)
Called and spoke to pt. Pt c/o prod cough with green mucus, chest congestion, sinus pressure and congestion and is blowing out yellow nasal mucus. Pt denies SOB, CP/tightness or f/c/s. Pt stated if CY recommends an abx she prefers amoxicillin. Pt is taking flonase, mucinex DM and tylenol prn.   Dr. Annamaria Boots please advise.

## 2014-05-16 NOTE — Telephone Encounter (Signed)
Pt returned call [phone]

## 2014-05-23 ENCOUNTER — Telehealth: Payer: Self-pay | Admitting: Family Medicine

## 2014-05-23 NOTE — Telephone Encounter (Signed)
Pt has cough, congestion x 1 wk appt scheduled

## 2014-05-24 ENCOUNTER — Ambulatory Visit: Payer: Medicare Other

## 2014-05-25 ENCOUNTER — Telehealth: Payer: Self-pay | Admitting: Pulmonary Disease

## 2014-05-25 NOTE — Telephone Encounter (Signed)
Pt called and still is not getting well.  Still has chest congestion, head congestion, cough,  But is the mucus is CLEAR.  Just finished course of amoxacillin.  No fever. Does not feel more sob.  She has a h/o asthma, and I offered to send in a short prednisone taper for treatment of airway inflammation.  Do not think she needs another abx since mucus is now clear and just finished course.  She did not want to do the prednisone, and therefore I thought best she call office in am to get worked in for acute sick visit.  She agreed.  She knows to go to ER if she gets worse overnight.

## 2014-05-26 ENCOUNTER — Encounter: Payer: Self-pay | Admitting: Internal Medicine

## 2014-05-26 ENCOUNTER — Ambulatory Visit (INDEPENDENT_AMBULATORY_CARE_PROVIDER_SITE_OTHER): Payer: Medicare Other | Admitting: Internal Medicine

## 2014-05-26 VITALS — BP 100/70 | HR 74 | Ht 69.0 in | Wt 113.6 lb

## 2014-05-26 DIAGNOSIS — G4763 Sleep related bruxism: Secondary | ICD-10-CM

## 2014-05-26 DIAGNOSIS — J440 Chronic obstructive pulmonary disease with acute lower respiratory infection: Secondary | ICD-10-CM

## 2014-05-26 MED ORDER — TRAMADOL HCL 50 MG PO TABS: 50.0000 mg | ORAL_TABLET | Freq: Four times a day (QID) | ORAL | Status: DC | PRN

## 2014-05-26 MED ORDER — HYDROCODONE-HOMATROPINE 5-1.5 MG/5ML PO SYRP: 5.0000 mL | ORAL_SOLUTION | Freq: Four times a day (QID) | ORAL | Status: DC | PRN

## 2014-05-26 NOTE — Progress Notes (Signed)
12/28/12- 41 yoF never smoker-Dr Wong/ [facility]-last seen by [facility] 2009; seen at [facility] chest 2002; chronic cough  Has seen Dr. Lamonte Sakai here and Dr Neldon Mc in the past. Complains of allergies, asthma, chronic cough for over 20 years. Help with inhalers and cough medicines. Using rescue inhaler once or twice a day. Not using Qvar now. Particularly bad in spring and fall. [place]house smells musty when it rains. She thinks in retrospect that allergy shots in the past with more than she realized. She had one episode while on allergy shots, when she had throat tightness which scared her so she quit.. Occasional cough at night. Previous PFTs are filed, but as recently as 2009 showed reversible obstructive airways disease and small airways. Singulair blamed for paresthesias. Son smokes outside the home. History of sinus surgery, vocal cord polyp. Father had allergies and asthma  02/07/13- 62 yoF never smoker-Dr Wong/ [facility]-last seen by [facility] 2009; seen at [facility] chest 2002; followed for chronic cough, allergic rhinitis  ACUTE VISIT:  Cough worsening over the past month.  Cough dry-persistant  cough worse in the past week. Losing weight attributed to poor appetite which she blames on acid indigestion despite pantoprazole/ elevated HOB. She's worried the chest x-ray suggests emphysema. Anoro blamed for chest pain. CXR 01/30/13 CLINICAL DATA: Cough.  EXAM:  CHEST 2 VIEW  COMPARISON: Chest radiograph 05/15/2012.  FINDINGS:  The cardiopericardial silhouette and mediastinal contours are within  normal limits. Hyperinflation and severe emphysema is present. No  airspace disease or pleural effusion is identified.  IMPRESSION:  Emphysema without acute cardiopulmonary disease.  Electronically Signed  By: Dereck Ligas M.D.  On: 01/30/2013 13:00  03/15/13- 62 yoF never smoker-Dr Wong/ [facility]-last seen by [facility] 2009; seen at [facility] chest 2002; followed for chronic cough, allergic rhinitis  ACUTE VISIT: Cough since 01/2013.  Saw her PCP and was given prednisone with no relief. Reports cough, ear pain, chest tightness and congestion. Had outpatient bladder surgery and has been coughing up blood since then. Sinus surgery summer 2014. Now 5 days of increased head congestion. New hemoptysis, daily, since bladder surgery 5 days ago during which she was intubated. Now on cephalexin for UTI. Notices frontal headache, ears full. Treating leg cramps with bananas and mustard a1AT level 02/07/13- WNL  MM 167. CXR 01/30/13 IMPRESSION:  Emphysema without acute cardiopulmonary disease.  Electronically Signed  By: Dereck Ligas M.D.  On: 01/30/2013 13:00  03/29/13- 62 yoF never smoker (father smoked)- followed for chronic cough, allergic rhinitis , COPD by CT Pt c/o sinus pressure, headaches, ear pain, prod cough with sometimes clear mucous  She had prednisone in early Dec for cough, then reported streak hemoptysis and had CT. Has finished Keflex and prednisone with no more hemoptysis Admits head and sinus pressure. Taking Sudafed.  Has followup appointment with ENT in [place]. Allergy profile 12/28/2012 negative with total IgE 4.5. a1AT 02/07/13- WNL MM, 167 CT chest 03/25/13 IMPRESSION:  1. No CT evidence of acute pulmonary embolism.  2. Emphysema. No acute cardiopulmonary process identified.  Electronically Signed  By: Jeannine Boga M.D.  On: 03/25/2013 13:31  05/02/13- 85 yoF never smoker (father smoked)- followed for chronic cough, allergic rhinitis , COPD by CT No antihistmines, OTC cough syrups, or sleep aids in past 3 days; Pt having continues to have chest congestion, cough,"lump in throat feeling", sore throat as well. Husband is currently sick as well; wonders if she should have test today. ENT in [facility] gave augmentin 875, 3x daily> diarrhea  after 4 doses, but she thought it had started to help. He is scheduling CT sinus.  She bought colloidal silver preparation at [employer] store, but her  accupuncturist told her not to take it. Friend recommended alternative practitioner who gives Vit C infusions.  Main c/o is globus sensation with cough, maxillary facial pressure. Husband is sick. We are deferring allergy testing.  CT maxfac 05/13/2011 IMPRESSION:  No paranasal sinuses mucosal thickening or air-fluid levels.  Midline nasal septum. Question prior surgery bilateral medial wall  maxillary sinus and right ethmoid air cells.  Original Report Authenticated By: Lahoma Crocker, M.D. QuickVue Flu swab 25/15- NEG  05/30/13- 60 yoF never smoker (father smoked)- followed for chronic cough, allergic rhinitis , COPD by CT FOLLOWS FOR:  c/o: sinus pressure and cough with clear thick mucus and ringing in ears still. Raspy throat, but much better than before. Taking cefdinir 300 mg once daily for tolerance- 3 more days. ENT said sinuses ok at [facility]. Asks depo- prefers over prednisone.  08/28/13- 89 yoF never smoker (father smoked)- followed for chronic cough, allergic rhinitis , COPD by CT(Nl a1AT) No antihistamines, OTC sleep aids in past 3 days. Pt had 2 teaspoons of Tussin DM at 11:00am this morning. Complains of facial pain, sensitive upper teeth. Told by ENT sinuses good, but Rx'd nasal mometasone and tobra. Dentist says tooth pain not from sinuses. Integrative medicine doctor in [place] did some [facility] tests but she isn't going to follow up there.Continues Qvar. 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 For allergy skin testing 08/28/13-Positive- grass, tree, weed, dust. She will decide about allergy shots. Dr Laurance Flatten could give once she is at maintenance.  10/25/13-  23 yoF never smoker (father smoked)- followed for chronic cough, allergic rhinitis , COPD by CT(Nl a1AT) FOLLOWS FOR: pt c/o teeth pain, headaches.  Pt is interested in starting allergy injections.   Went to a ENT in [place]-CT sinus showed questionable polyp in right ethmoid sinus versus mucus. Sinus culture "scant bacteria". She is using a  tobramycin nasal rinse and being treated for thrush. Chiropractor treated pinched nerve in neck Neurologist tried gabapentin and one other medication Dentist told her "teeth are not causing it"-feelings of pressure in her nose and sinuses. She wears a mouth guard at night. We had commented on obvious aware of her teeth before, and potential significance of bruxism. She is also seeing an herbalist who was treated her by putting seeds in her ear. She was interested in allergy vaccine before but did not want to have to drive here twice a week during the buildup. We discussed realistic expectations and we discussed availability of SLIT therapy for grass and ragweed.  12/25/13-  63 yoF never smoker (father smoked)- followed for chronic cough, allergic rhinitis , COPD by CT(Nl a1AT) FOLLOWS FOR: red eyes, slight nasal stuffiness in Right side. Did not start Allergy vaccine.   04/29/14- 18 yoF never smoker (father smoked)- followed for chronic cough, allergic rhinitis , COPD by CT(Nl a1AT) FOLLOWS FOR: since last visit got sick and was put on Amoxicillin-cleared up. Has had several other treatments and meds as well due to Trigeminal neuralgia. Continues to have facial pain. Has trouble with her cough as well-has noticed Tessalon pearls made her drowsy-so she changed to Delsym cough syrup. H on the right and is pending repeat on left side of face. 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 Still some cough in the mornings. PCP started Symbicort 80. Taking ensure for weight loss.  05/26/14- 63 yoF never smoker (father smoked)- followed for chronic cough, allergic rhinitis , COPD by CT(Nl a1AT) ACUTE VISIT: cough started yesterday-called MD on call([facility]) last night-suggested OV today. Pt states the mucus is clear when coughing up. Pt took plain  Mucinex yesterday and 1 Mucinex DM today. Feels congested in chest and head-having headaches as well. Drowsy/sleepy during the day. Yesterday she took to Mucinex DM tablets and one half of the Xanax for cough and anxiety than called Dr.Clance. Because of nonproductive cough and persistent chest tightness. Tessalon makes her drowsy.  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 Dentist gave her a mouth guard to help with bruxism. This all relates to her feeling of facial pain and discomfort.  ROS-see HPI Constitutional:   No-   weight loss, night sweats, fevers, chills, fatigue, lassitude. HEENT:   + headaches, no-difficulty swallowing, +tooth/dental problems, +sore throat,       No-  sneezing, itching, ear ache, +nasal congestion, post nasal drip,  CV:  No-   chest pain, orthopnea, PND, swelling in lower extremities, anasarca, dizziness, palpitations Resp: No-   shortness of breath with exertion or at rest.              No-   productive cough,  + non-productive cough,  No- coughing up of blood.              No-   change in color of mucus.  No- wheezing.   Skin: No-   rash or lesions. GI:   No-heartburn, indigestion,  loss of appetite GU: . MS:  No-   joint pain or swelling.   Neuro-     nothing unusual Psych:  No- change in mood or affect. + depression or anxiety.  No memory loss.  OBJ- Physical Exam General- Alert, Oriented, Affect-appropriate, Distress- none acute, +thin. Sits hunched but can straighten. Skin- rash-none, lesions- none, excoriation- none Lymphadenopathy- none Head- atraumatic            Eyes- Gross vision intact, PERRLA, conjunctivae and secretions clear            Ears- Hearing, canals-normal            Nose- Clear, no-Septal dev, mucus, polyps, erosion, perforation             Throat- Mallampati II , mucosa+thrush , drainage- none, tonsils- atrophic.                  +Much dental Repair/ teeth are worn, +throat clearing Neck- flexible ,  trachea midline, no stridor , thyroid nl, carotid no bruit Chest - symmetrical excursion , unlabored           Heart/CV- RRR , no murmur , no gallop  , no rub, nl s1 s2                           - JVD- none , edema- none, stasis changes- none, varices- none           Lung- clear to P&A, wheeze- none, cough-none , dullness-none, rub- none. Long/thin  thorax.           Chest wall-  Abd-  Br/ Gen/ Rectal- Not done, not indicated Extrem- cyanosis- none, clubbing, none, atrophy- none, strength- nl Neuro- grossly intact to observation

## 2014-05-26 NOTE — Assessment & Plan Note (Signed)
Fitted by her dentist with a protective mouthpiece. We will watch to see impact on facial discomfort complaints.

## 2014-05-26 NOTE — Assessment & Plan Note (Signed)
We have managed as a mild persistent asthma/bronchitis think there is a significant anxiety component. Plan-try tramadol as discussed. Compare with hydrocodone cough syrup at her request for occasional use when she can tolerate drowsiness. Call for help if needed including antibiotics or bronchodilators. Consider methacholine challenge.

## 2014-05-26 NOTE — Patient Instructions (Signed)
Script for tramadol and for hydrocodone cough syrup. Use these as needed  I hope that mouth guard helps.  Ok to use a half-xanax for sleep if needed

## 2014-05-30 DIAGNOSIS — N3010 Interstitial cystitis (chronic) without hematuria: Secondary | ICD-10-CM | POA: Diagnosis not present

## 2014-06-04 DIAGNOSIS — M5136 Other intervertebral disc degeneration, lumbar region: Secondary | ICD-10-CM | POA: Diagnosis not present

## 2014-06-04 DIAGNOSIS — M9902 Segmental and somatic dysfunction of thoracic region: Secondary | ICD-10-CM | POA: Diagnosis not present

## 2014-06-04 DIAGNOSIS — M5416 Radiculopathy, lumbar region: Secondary | ICD-10-CM | POA: Diagnosis not present

## 2014-06-04 DIAGNOSIS — M5408 Panniculitis affecting regions of neck and back, sacral and sacrococcygeal region: Secondary | ICD-10-CM | POA: Diagnosis not present

## 2014-06-04 DIAGNOSIS — M9903 Segmental and somatic dysfunction of lumbar region: Secondary | ICD-10-CM | POA: Diagnosis not present

## 2014-06-04 DIAGNOSIS — M5030 Other cervical disc degeneration, unspecified cervical region: Secondary | ICD-10-CM | POA: Diagnosis not present

## 2014-06-04 DIAGNOSIS — M9901 Segmental and somatic dysfunction of cervical region: Secondary | ICD-10-CM | POA: Diagnosis not present

## 2014-06-11 ENCOUNTER — Telehealth: Payer: Self-pay | Admitting: Family

## 2014-06-11 NOTE — Telephone Encounter (Signed)
Pt called to inform that her vagina is discolored due to taking Azo for extended period of time She states that she has taken approximately 18 tablets in 2 weeks Offered pt appt, she declined stating she has appt with urologist on 06/12/2014

## 2014-06-12 DIAGNOSIS — N3010 Interstitial cystitis (chronic) without hematuria: Secondary | ICD-10-CM | POA: Diagnosis not present

## 2014-06-16 ENCOUNTER — Encounter: Payer: Self-pay | Admitting: Family Medicine

## 2014-06-16 ENCOUNTER — Ambulatory Visit (INDEPENDENT_AMBULATORY_CARE_PROVIDER_SITE_OTHER): Payer: Medicare Other | Admitting: Family Medicine

## 2014-06-16 VITALS — BP 98/65 | HR 74 | Temp 97.8°F | Ht 69.0 in | Wt 111.6 lb

## 2014-06-16 DIAGNOSIS — E538 Deficiency of other specified B group vitamins: Secondary | ICD-10-CM | POA: Diagnosis not present

## 2014-06-16 DIAGNOSIS — M79603 Pain in arm, unspecified: Secondary | ICD-10-CM | POA: Diagnosis not present

## 2014-06-16 DIAGNOSIS — J309 Allergic rhinitis, unspecified: Secondary | ICD-10-CM | POA: Diagnosis not present

## 2014-06-16 DIAGNOSIS — M545 Low back pain, unspecified: Secondary | ICD-10-CM

## 2014-06-16 DIAGNOSIS — M79601 Pain in right arm: Secondary | ICD-10-CM

## 2014-06-16 DIAGNOSIS — R209 Unspecified disturbances of skin sensation: Secondary | ICD-10-CM | POA: Diagnosis not present

## 2014-06-16 DIAGNOSIS — M79609 Pain in unspecified limb: Secondary | ICD-10-CM | POA: Diagnosis not present

## 2014-06-16 DIAGNOSIS — M79602 Pain in left arm: Secondary | ICD-10-CM

## 2014-06-16 DIAGNOSIS — R202 Paresthesia of skin: Secondary | ICD-10-CM | POA: Diagnosis not present

## 2014-06-16 LAB — POCT URINALYSIS DIPSTICK
BILIRUBIN UA: NEGATIVE
Glucose, UA: NEGATIVE
Ketones, UA: NEGATIVE
LEUKOCYTES UA: NEGATIVE
NITRITE UA: NEGATIVE
PH UA: 8.5
PROTEIN UA: NEGATIVE
RBC UA: NEGATIVE
Spec Grav, UA: <=1.005
UROBILINOGEN UA: NEGATIVE

## 2014-06-16 LAB — POCT UA - MICROSCOPIC ONLY
Bacteria, U Microscopic: NEGATIVE
CRYSTALS, UR, HPF, POC: NEGATIVE
Casts, Ur, LPF, POC: NEGATIVE
Mucus, UA: NEGATIVE
RBC, URINE, MICROSCOPIC: NEGATIVE
Yeast, UA: NEGATIVE

## 2014-06-16 LAB — POCT CBC
Granulocyte percent: 67.5 %G (ref 37–80)
HCT, POC: 35.7 % — AB (ref 37.7–47.9)
Hemoglobin: 11.6 g/dL — AB (ref 12.2–16.2)
LYMPH, POC: 1.2 (ref 0.6–3.4)
MCH: 30.3 pg (ref 27–31.2)
MCHC: 32.4 g/dL (ref 31.8–35.4)
MCV: 93.6 fL (ref 80–97)
MPV: 8.5 fL (ref 0–99.8)
PLATELET COUNT, POC: 180 10*3/uL (ref 142–424)
POC Granulocyte: 3.3 (ref 2–6.9)
POC LYMPH PERCENT: 23.7 %L (ref 10–50)
RBC: 3.82 M/uL — AB (ref 4.04–5.48)
RDW, POC: 12.8 %
WBC: 4.9 10*3/uL (ref 4.6–10.2)

## 2014-06-16 MED ORDER — CYANOCOBALAMIN 1000 MCG/ML IJ SOLN
1000.0000 ug | INTRAMUSCULAR | Status: DC
  Administered 2014-06-16: 1000 ug via INTRAMUSCULAR

## 2014-06-16 NOTE — Patient Instructions (Signed)
To help with your bones be sure and take calcium 600 mg 3 times a day. Also vitamin D 2000 mg/units daily.  Take vitamin B12 1000 units daily   postnasal drainage try Flonase over-the-counter if that's inadequate you can add Allegra 60 mg one to 3 times daily as needed.  Zantac can be taken 150 twice a day for reflux and increased to 300 twice a day if needed to control the reflux. Monitor for eradication of the tingling. If the tingling goes away then the Protonix may be the cause but not necessarily.  Remember lack of vitamin B12 can cause the mouth sores. It can also cause the tingling you been experiencing.

## 2014-06-16 NOTE — Progress Notes (Signed)
Subjective:  Patient ID: [name], female    DOB: 11-01-1950  Age: 64 y.o. MRN: [id number]  CC: Back Pain and Dysuria   HPI Kristy Garza presents for numbness tingling and burning sensation of all of the extremities in a stocking glove distribution. She is concerned that pantoprazole for her reflux may be causing her to have osteoporosis.  Patient in for follow-up of GERD. Currently asymptomatic taking  PPI daily. There is no chest pain or heartburn. No hematemesis and no melena. No dysphagia or choking. Onset is remote. Progression is stable. Concern for possible vitamin B12 deficiency..  Patient having sniffling sneezing itchy watery eyes and nose. Onset about a week ago. She gets this usually every year. She denies asthma exacerbation at this point. She does use her inhaler frequently.  Her back pain is constant and moderate in severity. It is affected by bending and lifting and twisting. It does not radiate. It does respond to pain relief treatment with opiates History Waleska has a past medical history of Asthma; Osteoporosis; Acid reflux; PONV (postoperative nausea and vomiting); Tinnitus; Allergy; Interstitial cystitis; and Trigeminal neuralgia.   She has past surgical history that includes Abdominal hysterectomy (2000); vocal cord surgery  (9741'U); Ethmoidectomy (2012); Septoplasty (1980's); and Cystoscopy with hydrodistension and biopsy (N/A, 06/11/2012).   Her family history includes Allergies in her father; Cancer in her father; Hyperlipidemia in her mother.She reports that she has never smoked. She has never used smokeless tobacco. She reports that she does not drink alcohol or use illicit drugs.  Current Outpatient Prescriptions on File Prior to Visit  Medication Sig Dispense Refill  . albuterol (PROVENTIL HFA;VENTOLIN HFA) 108 (90 BASE) MCG/ACT inhaler Inhale 2 puffs into the lungs every 4 (four) hours as needed for wheezing or shortness of breath. 1 Inhaler prn  .  albuterol (PROVENTIL) (2.5 MG/3ML) 0.083% nebulizer solution Take 3 mLs (2.5 mg total) by nebulization every 6 (six) hours as needed for wheezing or shortness of breath. 150 mL 1  . budesonide-formoterol (SYMBICORT) 80-4.5 MCG/ACT inhaler Inhale 2 puffs into the lungs 2 (two) times daily. 1 Inhaler 3  . calcium citrate-vitamin D (CITRACAL+D) 315-200 MG-UNIT per tablet Take 1 tablet by mouth.    . Calcium Glycerophosphate (PRELIEF PO) Take 1 tablet by mouth as needed (prior to acid containing foods).    . Cholecalciferol (VITAMIN D PO) Take 4,000 Units by mouth daily.     [name] ELMIRON 100 MG capsule Take 100 mg by mouth 2 (two) times daily.     . fluticasone (FLONASE) 50 MCG/ACT nasal spray Place 2 sprays into both nostrils daily.     [name] guaiFENesin (MUCINEX) 600 MG 12 hr tablet Take 600 mg by mouth 2 (two) times daily.    [name] HYDROcodone-homatropine (HYCODAN) 5-1.5 MG/5ML syrup Take 5 mLs by mouth every 6 (six) hours as needed for cough. 140 mL 0  . lidocaine (XYLOCAINE) 2 % jelly Apply topically as needed.    [name] LINZESS 290 MCG CAPS capsule TAKE 1 CAPSULE (290 MCG TOTAL) BY MOUTH DAILY. 30 capsule 2  . Multiple Vitamin (MULTIVITAMIN) tablet Take 1 tablet by mouth daily.    . Nutritional Supplements (DHEA PO) Take 5 mg by mouth daily.     . Omega-3 Fatty Acids (FISH OIL) 1000 MG CAPS Take 1 capsule by mouth 2 (two) times daily.     . pantoprazole (PROTONIX) 40 MG tablet Take 40 mg by mouth daily.    . polyethylene glycol (MIRALAX /  GLYCOLAX) packet Take 17 g by mouth daily as needed.    . raloxifene (EVISTA) 60 MG tablet Take one po qd 30 tablet 2  . traMADol (ULTRAM) 50 MG tablet Take 1 tablet (50 mg total) by mouth every 6 (six) hours as needed. 15 tablet 1   Current Facility-Administered Medications on File Prior to Visit  Medication Dose Route Frequency Provider Last Rate Last Dose  . bupivacaine (MARCAINE) 0.5 % 10 mL, triamcinolone acetonide (KENALOG-40) 40 mg injection   Subcutaneous Once  Carolan Clines, MD      . bupivacaine (MARCAINE) 0.5 % 15 mL, phenazopyridine (PYRIDIUM) 400 mg bladder mixture   Bladder Instillation Once Carolan Clines, MD        ROS Review of Systems  Constitutional: Negative for fever, chills, diaphoresis, appetite change, fatigue and unexpected weight change.  HENT: Negative for congestion, ear pain, hearing loss, postnasal drip, rhinorrhea, sneezing, sore throat and trouble swallowing.   Eyes: Negative for pain.  Respiratory: Negative for cough, chest tightness and shortness of breath.   Cardiovascular: Negative for chest pain and palpitations.  Gastrointestinal: Negative for nausea, vomiting, abdominal pain, diarrhea and constipation.  Genitourinary: Negative for dysuria, frequency and menstrual problem.  Musculoskeletal: Negative for joint swelling and arthralgias.  Skin: Negative for rash.  Neurological: Negative for dizziness, weakness, numbness and headaches.  Psychiatric/Behavioral: Negative for dysphoric mood and agitation.    Objective:  BP 98/65 mmHg  Pulse 74  Temp(Src) 97.8 F (36.6 C) (Oral)  Ht $R'5\' 9"'QS$  (1.753 m)  Wt 111 lb 9.6 oz (50.621 kg)  BMI 16.47 kg/m2  BP Readings from Last 3 Encounters:  06/16/14 98/65  05/26/14 100/70  05/07/14 118/66    Wt Readings from Last 3 Encounters:  06/16/14 111 lb 9.6 oz (50.621 kg)  05/26/14 113 lb 9.6 oz (51.529 kg)  05/07/14 109 lb 4.8 oz (49.578 kg)     Physical Exam  Constitutional: She is oriented to person, place, and time. She appears well-developed and well-nourished. No distress.  HENT:  Head: Normocephalic and atraumatic.  Right Ear: External ear normal.  Left Ear: External ear normal.  Nose: Nose normal.  Mouth/Throat: Oropharynx is clear and moist.  Eyes: Conjunctivae and EOM are normal. Pupils are equal, round, and reactive to light.  Neck: Normal range of motion. Neck supple. No thyromegaly present.  Cardiovascular: Normal rate, regular rhythm and normal  heart sounds.   No murmur heard. Pulmonary/Chest: Effort normal and breath sounds normal. No respiratory distress. She has no wheezes. She has no rales.  Abdominal: Soft. Bowel sounds are normal. She exhibits no distension. There is no tenderness.  Lymphadenopathy:    She has no cervical adenopathy.  Neurological: She is alert and oriented to person, place, and time. She has normal reflexes.  Skin: Skin is warm and dry.  Psychiatric: She has a normal mood and affect. Her behavior is normal. Judgment and thought content normal.    No results found for: HGBA1C  Lab Results  Component Value Date   WBC 4.9 06/16/2014   HGB 11.6* 06/16/2014   HCT 35.7* 06/16/2014   PLT 195 02/14/2014   GLUCOSE 89 02/14/2014   CHOL 149 02/14/2014   TRIG 51 02/14/2014   HDL 51 02/14/2014   LDLDIRECT 99 02/14/2014   LDLCALC 88 02/14/2014   ALT 14 02/14/2014   AST 19 02/14/2014   NA 140 02/14/2014   K 4.4 02/14/2014   CL 102 02/14/2014   CREATININE 0.75 02/14/2014   BUN 13  02/14/2014   CO2 22 02/14/2014   TSH 0.548 11/01/2013    Ct Angio Chest W/cm &/or Wo Cm  03/25/2013   CLINICAL DATA:  Persistent cough and shortness of breath status post sinus surgery 2 months ago. Evaluate for pulmonary embolism.  EXAM: CT ANGIOGRAPHY CHEST WITH CONTRAST  TECHNIQUE: Multidetector CT imaging of the chest was performed using the standard protocol during bolus administration of intravenous contrast. Multiplanar CT image reconstructions including MIPs were obtained to evaluate the vascular anatomy.  CONTRAST:  66mL OMNIPAQUE IOHEXOL 350 MG/ML SOLN  COMPARISON:  Prior radiograph from 01/30/2013  FINDINGS: Subcentimeter calcific density noted within the left lobe of thyroid. The thyroid gland is otherwise unremarkable.  No pathologically enlarged mediastinal, hilar, or axillary lymph nodes are identified.  The aorta and great vessels demonstrate a normal contrast enhanced appearance. No evidence of aortic aneurysm,  dissection, or other acute abnormality.  Heart size is within normal limits.  No pericardial effusion.  Pulmonary arterial tree is well opacified. No filling defects are seen to suggest acute pulmonary embolism. Re-formatted imaging confirms these findings.  The lungs are hyperinflated with attenuation of the pulmonary markings, compatible with emphysema. Oblique linear opacity within the left lung base is most compatible with atelectasis and/or scarring. No focal infiltrate identified. No pulmonary edema or pleural effusion.  The visualized portions of the upper abdomen are grossly unremarkable.  No acute osseous abnormality identified. Mild degenerative changes noted within the visualized spine. No focal lytic or blastic osseous lesions.  IMPRESSION: 1. No CT evidence of acute pulmonary embolism. 2. Emphysema.  No acute cardiopulmonary process identified.   Electronically Signed   By: Jeannine Boga M.D.   On: 03/25/2013 13:31    Assessment & Plan:   Chaia was seen today for back pain and dysuria.  Diagnoses and all orders for this visit:  Low back pain, non-specific Orders: -     POCT urinalysis dipstick -     POCT UA - Microscopic Only  Allergic rhinitis, unspecified allergic rhinitis type  Paresthesia and pain of both upper extremities Orders: -     CMP14+EGFR -     POCT CBC -     Thyroid Panel With TSH -     cyanocobalamin ((VITAMIN B-12)) injection 1,000 mcg; Inject 1 mL (1,000 mcg total) into the muscle every 30 (thirty) days. -     Vitamin B12 -     Vit D  25 hydroxy (rtn osteoporosis monitoring)   I am having Ms. Capece maintain her pantoprazole, lidocaine, albuterol, guaiFENesin, Fish Oil, Cholecalciferol (VITAMIN D PO), Nutritional Supplements (DHEA PO), polyethylene glycol, ELMIRON, fluticasone, calcium citrate-vitamin D, raloxifene, Calcium Glycerophosphate (PRELIEF PO), LINZESS, multivitamin, albuterol, budesonide-formoterol, traMADol, and HYDROcodone-homatropine. We  administered cyanocobalamin. We will continue to administer cyanocobalamin.  Meds ordered this encounter  Medications  . cyanocobalamin ((VITAMIN B-12)) injection 1,000 mcg    Sig:      Follow-up: No Follow-up on file.  Kristy Garza, M.D.

## 2014-06-17 ENCOUNTER — Telehealth: Payer: Self-pay | Admitting: Family Medicine

## 2014-06-17 DIAGNOSIS — N3010 Interstitial cystitis (chronic) without hematuria: Secondary | ICD-10-CM | POA: Diagnosis not present

## 2014-06-17 LAB — CMP14+EGFR
ALK PHOS: 75 IU/L (ref 39–117)
ALT: 18 IU/L (ref 0–32)
AST: 25 IU/L (ref 0–40)
Albumin/Globulin Ratio: 2.2 (ref 1.1–2.5)
Albumin: 4.6 g/dL (ref 3.6–4.8)
BILIRUBIN TOTAL: 0.3 mg/dL (ref 0.0–1.2)
BUN/Creatinine Ratio: 20 (ref 11–26)
BUN: 13 mg/dL (ref 8–27)
CO2: 27 mmol/L (ref 18–29)
Calcium: 9.8 mg/dL (ref 8.7–10.3)
Chloride: 98 mmol/L (ref 97–108)
Creatinine, Ser: 0.64 mg/dL (ref 0.57–1.00)
GFR, EST AFRICAN AMERICAN: 110 mL/min/{1.73_m2} (ref >59)
GFR, EST NON AFRICAN AMERICAN: 95 mL/min/{1.73_m2} (ref >59)
Globulin, Total: 2.1 g/dL (ref 1.5–4.5)
Glucose: 91 mg/dL (ref 65–99)
POTASSIUM: 4.5 mmol/L (ref 3.5–5.2)
Sodium: 140 mmol/L (ref 134–144)
Total Protein: 6.7 g/dL (ref 6.0–8.5)

## 2014-06-17 LAB — THYROID PANEL WITH TSH
Free Thyroxine Index: 2.4 (ref 1.2–4.9)
T3 Uptake Ratio: 31 % (ref 24–39)
T4, Total: 7.6 ug/dL (ref 4.5–12.0)
TSH: 0.355 u[IU]/mL — ABNORMAL LOW (ref 0.450–4.500)

## 2014-06-17 LAB — VITAMIN D 25 HYDROXY (VIT D DEFICIENCY, FRACTURES): VIT D 25 HYDROXY: 39 ng/mL (ref 30.0–100.0)

## 2014-06-17 LAB — VITAMIN B12

## 2014-06-17 NOTE — Telephone Encounter (Signed)
Please review and advise.

## 2014-06-17 NOTE — Telephone Encounter (Signed)
I believe she's already taking it 600 mg twice a day. For the tingling she can increase it to 600 mg 3 times a day if desired

## 2014-06-18 ENCOUNTER — Encounter: Payer: Self-pay | Admitting: Family Medicine

## 2014-06-18 ENCOUNTER — Telehealth: Payer: Self-pay | Admitting: Family Medicine

## 2014-06-19 NOTE — Telephone Encounter (Signed)
Spoke with patient.

## 2014-06-19 NOTE — Telephone Encounter (Signed)
Patient aware of labs.  

## 2014-06-24 ENCOUNTER — Ambulatory Visit: Payer: Medicare Other | Admitting: Family

## 2014-07-01 ENCOUNTER — Ambulatory Visit: Payer: Medicare Other | Admitting: Family Medicine

## 2014-07-02 DIAGNOSIS — M5408 Panniculitis affecting regions of neck and back, sacral and sacrococcygeal region: Secondary | ICD-10-CM | POA: Diagnosis not present

## 2014-07-02 DIAGNOSIS — M5136 Other intervertebral disc degeneration, lumbar region: Secondary | ICD-10-CM | POA: Diagnosis not present

## 2014-07-02 DIAGNOSIS — M5416 Radiculopathy, lumbar region: Secondary | ICD-10-CM | POA: Diagnosis not present

## 2014-07-02 DIAGNOSIS — M5030 Other cervical disc degeneration, unspecified cervical region: Secondary | ICD-10-CM | POA: Diagnosis not present

## 2014-07-02 DIAGNOSIS — M9902 Segmental and somatic dysfunction of thoracic region: Secondary | ICD-10-CM | POA: Diagnosis not present

## 2014-07-02 DIAGNOSIS — M9901 Segmental and somatic dysfunction of cervical region: Secondary | ICD-10-CM | POA: Diagnosis not present

## 2014-07-02 DIAGNOSIS — M9903 Segmental and somatic dysfunction of lumbar region: Secondary | ICD-10-CM | POA: Diagnosis not present

## 2014-07-11 ENCOUNTER — Ambulatory Visit (INDEPENDENT_AMBULATORY_CARE_PROVIDER_SITE_OTHER): Payer: Medicare Other | Admitting: Pharmacist

## 2014-07-11 VITALS — BP 96/65 | HR 80 | Ht 67.5 in | Wt 115.0 lb

## 2014-07-11 DIAGNOSIS — M810 Age-related osteoporosis without current pathological fracture: Secondary | ICD-10-CM

## 2014-07-11 DIAGNOSIS — M9903 Segmental and somatic dysfunction of lumbar region: Secondary | ICD-10-CM | POA: Diagnosis not present

## 2014-07-11 DIAGNOSIS — M5416 Radiculopathy, lumbar region: Secondary | ICD-10-CM | POA: Diagnosis not present

## 2014-07-11 DIAGNOSIS — M5408 Panniculitis affecting regions of neck and back, sacral and sacrococcygeal region: Secondary | ICD-10-CM | POA: Diagnosis not present

## 2014-07-11 DIAGNOSIS — Z0000 Encounter for general adult medical examination without abnormal findings: Secondary | ICD-10-CM | POA: Diagnosis not present

## 2014-07-11 DIAGNOSIS — M5136 Other intervertebral disc degeneration, lumbar region: Secondary | ICD-10-CM | POA: Diagnosis not present

## 2014-07-11 DIAGNOSIS — Z79899 Other long term (current) drug therapy: Secondary | ICD-10-CM | POA: Diagnosis not present

## 2014-07-11 DIAGNOSIS — M9902 Segmental and somatic dysfunction of thoracic region: Secondary | ICD-10-CM | POA: Diagnosis not present

## 2014-07-11 DIAGNOSIS — M9901 Segmental and somatic dysfunction of cervical region: Secondary | ICD-10-CM | POA: Diagnosis not present

## 2014-07-11 DIAGNOSIS — M5030 Other cervical disc degeneration, unspecified cervical region: Secondary | ICD-10-CM | POA: Diagnosis not present

## 2014-07-11 NOTE — Addendum Note (Signed)
Addended by: [name] on: 07/11/2014 07:37 PM   Modules accepted: Miquel Dunn

## 2014-07-11 NOTE — Progress Notes (Addendum)
Patient ID: ANIAYAH ALANIZ, female   DOB: Feb 06, 1951, 64 y.o.   MRN: [id number]    Subjective:   Kristy Garza is a 64 y.o. female who presents for an Initial Medicare Annual Wellness Visit.  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  She has started Celebrex and cylobenzaprine and also has mouth guard.   She has several questions and concerns about medications including side effects of pantoprazole, Kava Kava use and concerns about taking Excedrin daily along with Celebrex.  Current Medications (verified) Outpatient Encounter Prescriptions as of 07/11/2014  Medication Sig  . albuterol (PROVENTIL HFA;VENTOLIN HFA) 108 (90 BASE) MCG/ACT inhaler Inhale 2 puffs into the lungs every 4 (four) hours as needed for wheezing or shortness of breath.  [name] albuterol (PROVENTIL) (2.5 MG/3ML) 0.083% nebulizer solution Take 3 mLs (2.5 mg total) by nebulization every 6 (six) hours as needed for wheezing or shortness of breath. (Patient not taking: Reported on 07/11/2014)  . aspirin-acetaminophen-caffeine (EXCEDRIN MIGRAINE) 250-250-65 MG per tablet Take 1 tablet by mouth every 6 (six) hours as needed for headache.  . benzonatate (TESSALON) 200 MG capsule Take 200 mg by mouth 3 (three) times daily.  . budesonide-formoterol (SYMBICORT) 80-4.5 MCG/ACT inhaler Inhale 2 puffs into the lungs 2 (two) times daily.  . calcium citrate-vitamin D (CITRACAL+D) 315-200 MG-UNIT per tablet Take 1 tablet by mouth.  . Calcium Glycerophosphate (PRELIEF PO) Take 1 tablet by mouth as needed (prior to acid containing foods).  . celecoxib (CELEBREX) 100 MG capsule Take 100 mg by mouth 2 (two) times daily.  . Cholecalciferol (VITAMIN D PO) Take 4,000 Units by mouth daily.   . cyclobenzaprine (FLEXERIL) 5 MG tablet Take 5 mg by mouth 3 (three) times daily as needed for muscle spasms.  [name] ELMIRON 100 MG capsule Take 100 mg by mouth 2 (two) times daily.   . fexofenadine (ALLEGRA) 60 MG tablet Take 60  mg by mouth 2 (two) times daily.  . fluticasone (FLONASE) 50 MCG/ACT nasal spray Place 2 sprays into both nostrils daily.   [name] guaiFENesin (MUCINEX) 600 MG 12 hr tablet Take 600 mg by mouth 2 (two) times daily.  [name] HYDROcodone-homatropine (HYCODAN) 5-1.5 MG/5ML syrup Take 5 mLs by mouth every 6 (six) hours as needed for cough. (Patient not taking: Reported on 07/11/2014)  . lidocaine (XYLOCAINE) 2 % jelly Apply topically as needed.  [name] LINZESS 290 MCG CAPS capsule TAKE 1 CAPSULE (290 MCG TOTAL) BY MOUTH DAILY.  . Multiple Vitamin (MULTIVITAMIN) tablet Take 1 tablet by mouth daily.  . Nutritional Supplements (DHEA PO) Take 5 mg by mouth daily.   . Omega-3 Fatty Acids (FISH OIL) 1000 MG CAPS Take 1 capsule by mouth 2 (two) times daily.   . pantoprazole (PROTONIX) 40 MG tablet Take 40 mg by mouth daily.  . polyethylene glycol (MIRALAX / GLYCOLAX) packet Take 17 g by mouth daily as needed.  . raloxifene (EVISTA) 60 MG tablet Take one po qd  . traMADol (ULTRAM) 50 MG tablet Take 1 tablet (50 mg total) by mouth every 6 (six) hours as needed.    Allergies (verified) Augmentin; Avelox; Biaxin; Cedax; Ciprofloxacin; Clindamycin/lincomycin; Gabapentin; Levofloxacin; Lyrica; Singulair; and Sulfonamide derivatives   History: Past Medical History  Diagnosis Date  . Asthma   . Osteoporosis   . Acid reflux   . PONV (postoperative nausea and vomiting)   . Tinnitus   . Allergy   . Interstitial cystitis   . Trigeminal neuralgia  Atypical trigeminal neuralgia  . Arthritis     ARTHRITIS IN NECK BY DR. Michelle Nasuti   Past Surgical History  Procedure Laterality Date  . Abdominal hysterectomy  2000  . 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 Ethmoidectomy  2012  . Septoplasty  1980's  . Cystoscopy with hydrodistension and biopsy N/A 06/11/2012    Procedure: CYSTOSCOPY/BIOPSY/HYDRODISTENSION with Instillation of Pyridium and Marcaine and Kenalog;  Surgeon: Ailene Rud, MD;  Location:  [facility];  Service: Urology;  Laterality: N/A;  1 hour requested for this case  BLADDER BIOPSY   Family History  Problem Relation Age of Onset  . Hyperlipidemia Mother   . Arthritis Mother   . Cancer Father   . Allergies Father   . Allergies Sister   . Allergies Sister    Social History   Occupational History  . unemployeed    Social History Main Topics  . Smoking status: Never Smoker   . Smokeless tobacco: Never Used  . Alcohol Use: No  . Drug Use: No  . Sexual Activity: No    Dietary issues and exercise activities: Current Exercise Habits:: Home exercise routine, Type of exercise: walking, Time (Minutes): > 60, Frequency (Times/Week): 2, Weekly Exercise (Minutes/Week): 0, Intensity: Moderate  Current Dietary habits:  Patient has bought Plexus nutrition supplement and started. She has gained 3# recently.  Objective:    Today's Vitals   07/11/14 1115  BP: 96/65  Pulse: 80  Height: 5' 7.5" (1.715 m)  Weight: 115 lb (52.164 kg)   Body mass index is 17.74 kg/(m^2).  Activities of Daily Living In your present state of health, do you have any difficulty performing the following activities: 07/11/2014  Hearing? N  Vision? N  Difficulty concentrating or making decisions? N  Walking or climbing stairs? N  Dressing or bathing? N  Doing errands, shopping? N  Preparing Food and eating ? N  Using the Toilet? N  In the past six months, have you accidently leaked urine? N  Do you have problems with loss of bowel control? N  Managing your Medications? N  Managing your Finances? N  Housekeeping or managing your Housekeeping? N      Cardiac Risk Factors include: none  Depression Screen PHQ 2/9 Scores 07/11/2014 04/15/2014 02/17/2014 04/12/2013  PHQ - 2 Score 0 4 1 0  PHQ- 9 Score - 13 - -    Fall Risk Fall Risk  07/11/2014 04/15/2014 02/17/2014 04/12/2013  Falls in the past year? No No No No    Cognitive Function: MMSE - Mini Mental State Exam  07/11/2014  Orientation to time 5  Orientation to Place 5  Registration 3  Attention/ Calculation 5  Recall 2  Language- name 2 objects 2  Language- repeat 1  Language- follow 3 step command 3  Language- read & follow direction 1  Write a sentence 1  Copy design 1  Total score 29    Immunizations and Health Maintenance Immunization History  Administered Date(s) Administered  . Influenza,inj,Quad PF,36+ Mos 12/28/2012, 12/25/2013  . Pneumococcal Conjugate-13 02/07/2013  . Tdap 05/21/2010  . Zoster 03/27/2012   There are no preventive care reminders to display for this patient.  Patient Care Team: [name], MD as PCP - General (Family Medicine)  Indicate any recent Medical Services you may have received from other than [facility] providers in the past year (date may be approximate).    Assessment:    Annual Wellness  Visit  Medication Management   Screening Tests Health Maintenance  Topic Date Due  . PAP SMEAR  08/16/2014 (Originally 06/06/2013)  . HIV Screening  09/16/2014 (Originally 11/18/1965)  . INFLUENZA VACCINE  10/27/2014  . MAMMOGRAM  08/31/2015  . COLONOSCOPY  03/28/2017  . TETANUS/TDAP  05/21/2020  . ZOSTAVAX  Completed        Plan:   During the course of the visit Deondra was educated and counseled about the following appropriate screening and preventive services:   Vaccines to include Pneumoccal, Influenza, Hepatitis B, Td, Zostavax - currently UTD on all required vaccines  Colorectal cancer screening - colonoscopy and FOBT UTD  Cardiovascular disease screening - lipids UTD, no history of CV disease  Diabetes screening - UTD  Bone Denisty / Osteoporosis Screening - due 01/2015  Mammogram - appt made for 09/2014  Glaucoma screening / Diabetic Eye Exam - has been several years since last checked - patient encouraged to make appt for recheck.  Nutrition counseling  -continue with supplement.  Patient is advised to stop if she notices any weight  loss as the Plexxus product is for weight loss but can also help with building muscle.   Advanced Directives - information given in office today  Advised try to decrease use of Excedrin (try cutting to 1/2 tablet daily to start and then D/C) as the caffeine in Excedrin might be causing rebound HAs.  Try to decrease pantoprazaole to every other day.  Can use Zantac or pepcid acid reducer on opposite day if needed.    Look into Silver Sneaker program at [facility]. - they have a great balance class to help decrease falls and strengthen muscles  Patient Instructions (the written plan) were given to the patient.   Cherre Robins, [facility]   07/11/2014         I have reviewed and agree with the above AWV documentation.  [name], M.D.

## 2014-07-11 NOTE — Patient Instructions (Signed)
Sent referral to recheck DEXA in 01/2015. Mammogram is scheduled for Wednesday July 27th, 2016 at 2:00 PM at [facility] at [place].  Try to decrease pantoprazole 40mg  to every other day.  Can use zantac or pepcid acid controlled on opposite day.  If no increase in reflux or heartburn after 1 month then try to discontinue.  Also try to decrease Excedrin Extra Strength use - caffeine might be causing rebound headaches.  Try to take 1/2 tablet daily as needed.   Look into [facility] Sne[employer] program at [facility]. - they have a great balance class to help decrease falls and strengthen muscles.   Fall Prevention and Home Safety Falls cause injuries and can affect all age groups. It is possible to use preventive measures to significantly decrease the likelihood of falls. There are many simple measures which can make your home safer and prevent falls. OUTDOORS  Repair cracks and edges of walkways and driveways.  Remove high doorway thresholds.  Trim shrubbery on the main path into your home.  Have good outside lighting.  Clear walkways of tools, rocks, debris, and clutter.  Check that handrails are not broken and are securely fastened. Both sides of steps should have handrails.  Have leaves, snow, and ice cleared regularly.  Use sand or salt on walkways during winter months.  In the garage, clean up grease or oil spills. BATHROOM  Install night lights.  Install grab bars by the toilet and in the tub and shower.  Use non-skid mats or decals in the tub or shower.  Place a plastic non-slip stool in the shower to sit on, if needed.  Keep floors dry and clean up all water on the floor immediately.  Remove soap buildup in the tub or shower on a regular basis.  Secure bath mats with non-slip, double-sided rug tape.  Remove throw rugs and tripping hazards from the floors. BEDROOMS  Install night lights.  Make sure a bedside light is easy to reach.  Do not use oversized  bedding.  Keep a telephone by your bedside.  Have a firm chair with side arms to use for getting dressed.  Remove throw rugs and tripping hazards from the floor. KITCHEN  Keep handles on pots and pans turned toward the center of the stove. Use back burners when possible.  Clean up spills quickly and allow time for drying.  Avoid walking on wet floors.  Avoid hot utensils and knives.  Position shelves so they are not too high or low.  Place commonly used objects within easy reach.  If necessary, use a sturdy step stool with a grab bar when reaching.  Keep electrical cables out of the way.  Do not use floor polish or wax that makes floors slippery. If you must use wax, use non-skid floor wax.  Remove throw rugs and tripping hazards from the floor. STAIRWAYS  Never leave objects on stairs.  Place handrails on both sides of stairways and use them. Fix any loose handrails. Make sure handrails on both sides of the stairways are as long as the stairs.  Check carpeting to make sure it is firmly attached along stairs. Make repairs to worn or loose carpet promptly.  Avoid placing throw rugs at the top or bottom of stairways, or properly secure the rug with carpet tape to prevent slippage. Get rid of throw rugs, if possible.  Have an electrician put in a light switch at the top and bottom of the stairs. OTHER FALL PREVENTION TIPS  Wear low-heel or rubber-soled shoes that are supportive and fit well. Wear closed toe shoes.  When using a stepladder, make sure it is fully opened and both spreaders are firmly locked. Do not climb a closed stepladder.  Add color or contrast paint or tape to grab bars and handrails in your home. Place contrasting color strips on first and last steps.  Learn and use mobility aids as needed. Install an electrical emergency response system.  Turn on lights to avoid dark areas. Replace light bulbs that burn out immediately. Get light switches that  glow.  Arrange furniture to create clear pathways. Keep furniture in the same place.  Firmly attach carpet with non-skid or double-sided tape.  Eliminate uneven floor surfaces.  Select a carpet pattern that does not visually hide the edge of steps.  Be aware of all pets. OTHER HOME SAFETY TIPS  Set the water temperature for 120 F (48.8 C).  Keep emergency numbers on or near the telephone.  Keep smoke detectors on every level of the home and near sleeping areas. Document Released: 03/04/2002 Document Revised: 09/13/2011 Document Reviewed: 06/03/2011 [facility] Patient Information 2015 [place], [place]. This information is not intended to replace advice given to you by your health care provider. Make sure you discuss any questions you have with your health care provider.

## 2014-07-24 DIAGNOSIS — M5030 Other cervical disc degeneration, unspecified cervical region: Secondary | ICD-10-CM | POA: Diagnosis not present

## 2014-07-24 DIAGNOSIS — M9902 Segmental and somatic dysfunction of thoracic region: Secondary | ICD-10-CM | POA: Diagnosis not present

## 2014-07-24 DIAGNOSIS — M9903 Segmental and somatic dysfunction of lumbar region: Secondary | ICD-10-CM | POA: Diagnosis not present

## 2014-07-24 DIAGNOSIS — M9901 Segmental and somatic dysfunction of cervical region: Secondary | ICD-10-CM | POA: Diagnosis not present

## 2014-07-24 DIAGNOSIS — M5416 Radiculopathy, lumbar region: Secondary | ICD-10-CM | POA: Diagnosis not present

## 2014-07-24 DIAGNOSIS — M5408 Panniculitis affecting regions of neck and back, sacral and sacrococcygeal region: Secondary | ICD-10-CM | POA: Diagnosis not present

## 2014-07-24 DIAGNOSIS — M5136 Other intervertebral disc degeneration, lumbar region: Secondary | ICD-10-CM | POA: Diagnosis not present

## 2014-07-30 DIAGNOSIS — M5408 Panniculitis affecting regions of neck and back, sacral and sacrococcygeal region: Secondary | ICD-10-CM | POA: Diagnosis not present

## 2014-07-30 DIAGNOSIS — M5030 Other cervical disc degeneration, unspecified cervical region: Secondary | ICD-10-CM | POA: Diagnosis not present

## 2014-07-30 DIAGNOSIS — M5136 Other intervertebral disc degeneration, lumbar region: Secondary | ICD-10-CM | POA: Diagnosis not present

## 2014-07-30 DIAGNOSIS — M9901 Segmental and somatic dysfunction of cervical region: Secondary | ICD-10-CM | POA: Diagnosis not present

## 2014-07-30 DIAGNOSIS — M9903 Segmental and somatic dysfunction of lumbar region: Secondary | ICD-10-CM | POA: Diagnosis not present

## 2014-07-30 DIAGNOSIS — M5416 Radiculopathy, lumbar region: Secondary | ICD-10-CM | POA: Diagnosis not present

## 2014-07-30 DIAGNOSIS — M9902 Segmental and somatic dysfunction of thoracic region: Secondary | ICD-10-CM | POA: Diagnosis not present

## 2014-08-07 DIAGNOSIS — N3010 Interstitial cystitis (chronic) without hematuria: Secondary | ICD-10-CM | POA: Diagnosis not present

## 2014-08-08 ENCOUNTER — Ambulatory Visit: Payer: Medicare Other | Admitting: Neurology

## 2014-08-08 DIAGNOSIS — Z029 Encounter for administrative examinations, unspecified: Secondary | ICD-10-CM

## 2014-08-13 ENCOUNTER — Other Ambulatory Visit (INDEPENDENT_AMBULATORY_CARE_PROVIDER_SITE_OTHER): Payer: Medicare Other

## 2014-08-13 DIAGNOSIS — R7989 Other specified abnormal findings of blood chemistry: Secondary | ICD-10-CM | POA: Diagnosis not present

## 2014-08-13 DIAGNOSIS — R748 Abnormal levels of other serum enzymes: Secondary | ICD-10-CM

## 2014-08-13 NOTE — Progress Notes (Signed)
Lab only 

## 2014-08-14 ENCOUNTER — Other Ambulatory Visit: Payer: Self-pay

## 2014-08-14 LAB — THYROID PANEL WITH TSH
Free Thyroxine Index: 2.3 (ref 1.2–4.9)
T3 Uptake Ratio: 33 % (ref 24–39)
T4 TOTAL: 7 ug/dL (ref 4.5–12.0)
TSH: 0.402 u[IU]/mL — ABNORMAL LOW (ref 0.450–4.500)

## 2014-08-14 LAB — VITAMIN B12: Vitamin B-12: 558 pg/mL (ref 211–946)

## 2014-08-15 ENCOUNTER — Other Ambulatory Visit: Payer: Self-pay

## 2014-08-15 DIAGNOSIS — M9903 Segmental and somatic dysfunction of lumbar region: Secondary | ICD-10-CM | POA: Diagnosis not present

## 2014-08-15 DIAGNOSIS — M9902 Segmental and somatic dysfunction of thoracic region: Secondary | ICD-10-CM | POA: Diagnosis not present

## 2014-08-15 DIAGNOSIS — M5136 Other intervertebral disc degeneration, lumbar region: Secondary | ICD-10-CM | POA: Diagnosis not present

## 2014-08-15 DIAGNOSIS — M9901 Segmental and somatic dysfunction of cervical region: Secondary | ICD-10-CM | POA: Diagnosis not present

## 2014-08-15 DIAGNOSIS — M5030 Other cervical disc degeneration, unspecified cervical region: Secondary | ICD-10-CM | POA: Diagnosis not present

## 2014-08-15 DIAGNOSIS — M5416 Radiculopathy, lumbar region: Secondary | ICD-10-CM | POA: Diagnosis not present

## 2014-08-15 DIAGNOSIS — M5408 Panniculitis affecting regions of neck and back, sacral and sacrococcygeal region: Secondary | ICD-10-CM | POA: Diagnosis not present

## 2014-08-18 ENCOUNTER — Ambulatory Visit (INDEPENDENT_AMBULATORY_CARE_PROVIDER_SITE_OTHER): Payer: Medicare Other | Admitting: Family Medicine

## 2014-08-18 ENCOUNTER — Encounter: Payer: Self-pay | Admitting: Family Medicine

## 2014-08-18 VITALS — BP 106/77 | HR 84 | Temp 97.2°F | Ht 67.5 in | Wt 115.4 lb

## 2014-08-18 DIAGNOSIS — M2669 Other specified disorders of temporomandibular joint: Secondary | ICD-10-CM

## 2014-08-18 DIAGNOSIS — R946 Abnormal results of thyroid function studies: Secondary | ICD-10-CM

## 2014-08-18 DIAGNOSIS — J3489 Other specified disorders of nose and nasal sinuses: Secondary | ICD-10-CM | POA: Diagnosis not present

## 2014-08-18 DIAGNOSIS — N3010 Interstitial cystitis (chronic) without hematuria: Secondary | ICD-10-CM | POA: Diagnosis not present

## 2014-08-18 DIAGNOSIS — M26629 Arthralgia of temporomandibular joint, unspecified side: Secondary | ICD-10-CM

## 2014-08-18 DIAGNOSIS — J3089 Other allergic rhinitis: Secondary | ICD-10-CM

## 2014-08-18 DIAGNOSIS — R05 Cough: Secondary | ICD-10-CM

## 2014-08-18 DIAGNOSIS — K5901 Slow transit constipation: Secondary | ICD-10-CM | POA: Diagnosis not present

## 2014-08-18 DIAGNOSIS — K219 Gastro-esophageal reflux disease without esophagitis: Secondary | ICD-10-CM

## 2014-08-18 DIAGNOSIS — R053 Chronic cough: Secondary | ICD-10-CM

## 2014-08-18 MED ORDER — LINACLOTIDE 290 MCG PO CAPS: ORAL_CAPSULE | ORAL | Status: DC

## 2014-08-18 NOTE — Progress Notes (Signed)
Subjective:  Patient ID: [name], female    DOB: [birth year]  Age: 64 y.o. MRN: [id number]  CC: GAD; Cough; Temporomandibular Joint Pain; and Hyperthyroidism   HPI CLAUDIA GREENLEY presents for TMJ painful still. Going to integrated treatment facility.Tingling in fingers. Using celebrex and flexeril irregularly.  Constipated. Using Linzess qod.  She denies abdominal pain but she does have intermittent constipation for which she is using the [place] when necessary. She also notices that she's had a cough ongoing for several years. She relates this to sinus drainage and or reflux. However her reflux medicine and her allergy medicine have not abated the cough. It is a dry cough. It is tachycardia it occurs through the day and through the night. It does not keep her awake excessively.  She had a borderline elevation of her TSH 3 months ago and had it redrawn a few days ago. Patient presents for follow-up on  thyroid.  Pt. denies any  loss of hair, heat or cold intolerance. Energy level has been adequate to good. No myxedema History Cadee has a past medical history of Asthma; Osteoporosis; Acid reflux; PONV (postoperative nausea and vomiting); Tinnitus; Allergy; Interstitial cystitis; Trigeminal neuralgia; and Arthritis.   She has past surgical history that includes Abdominal hysterectomy (2000); vocal cord surgery  (6295'M); Ethmoidectomy (2012); Septoplasty (1980's); and Cystoscopy with hydrodistension and biopsy (N/A, 06/11/2012).   Her family history includes Allergies in her father, sister, and sister; Arthritis in her mother; Cancer in her father; Hyperlipidemia in her mother.She reports that she has never smoked. She has never used smokeless tobacco. She reports that she does not drink alcohol or use illicit drugs.  Outpatient Prescriptions Prior to Visit  Medication Sig Dispense Refill  . albuterol (PROVENTIL HFA;VENTOLIN HFA) 108 (90 BASE) MCG/ACT inhaler Inhale 2 puffs into the  lungs every 4 (four) hours as needed for wheezing or shortness of breath. 1 Inhaler prn  . albuterol (PROVENTIL) (2.5 MG/3ML) 0.083% nebulizer solution Take 3 mLs (2.5 mg total) by nebulization every 6 (six) hours as needed for wheezing or shortness of breath. 150 mL 1  . aspirin-acetaminophen-caffeine (EXCEDRIN MIGRAINE) 841-324-40 MG per tablet Take 1 tablet by mouth every 6 (six) hours as needed for headache.    . benzonatate (TESSALON) 200 MG capsule Take 200 mg by mouth 3 (three) times daily.    . budesonide-formoterol (SYMBICORT) 80-4.5 MCG/ACT inhaler Inhale 2 puffs into the lungs 2 (two) times daily. 1 Inhaler 3  . calcium citrate-vitamin D (CITRACAL+D) 315-200 MG-UNIT per tablet Take 1 tablet by mouth.    . Calcium Glycerophosphate (PRELIEF PO) Take 1 tablet by mouth as needed (prior to acid containing foods).    . celecoxib (CELEBREX) 100 MG capsule Take 100 mg by mouth 2 (two) times daily.    . Cholecalciferol (VITAMIN D PO) Take 4,000 Units by mouth daily.     . cyclobenzaprine (FLEXERIL) 5 MG tablet Take 5 mg by mouth 3 (three) times daily as needed for muscle spasms.    [name] ELMIRON 100 MG capsule Take 100 mg by mouth 2 (two) times daily.     . fexofenadine (ALLEGRA) 60 MG tablet Take 60 mg by mouth 2 (two) times daily.    . fluticasone (FLONASE) 50 MCG/ACT nasal spray Place 2 sprays into both nostrils daily.     [name] guaiFENesin (MUCINEX) 600 MG 12 hr tablet Take 600 mg by mouth 2 (two) times daily.    [name] lidocaine (XYLOCAINE) 2 % jelly Apply topically  as needed.    . Multiple Vitamin (MULTIVITAMIN) tablet Take 1 tablet by mouth daily.    . Nutritional Supplements (DHEA PO) Take 5 mg by mouth daily.     . Omega-3 Fatty Acids (FISH OIL) 1000 MG CAPS Take 1 capsule by mouth 2 (two) times daily.     . pantoprazole (PROTONIX) 40 MG tablet Take 40 mg by mouth daily.    . polyethylene glycol (MIRALAX / GLYCOLAX) packet Take 17 g by mouth daily as needed.    . raloxifene (EVISTA) 60 MG tablet  Take one po qd 30 tablet 2  . traMADol (ULTRAM) 50 MG tablet Take 1 tablet (50 mg total) by mouth every 6 (six) hours as needed. 15 tablet 1  . LINZESS 290 MCG CAPS capsule TAKE 1 CAPSULE (290 MCG TOTAL) BY MOUTH DAILY. 30 capsule 2  . HYDROcodone-homatropine (HYCODAN) 5-1.5 MG/5ML syrup Take 5 mLs by mouth every 6 (six) hours as needed for cough. (Patient not taking: Reported on 08/18/2014) 140 mL 0   Facility-Administered Medications Prior to Visit  Medication Dose Route Frequency Provider Last Rate Last Dose  . bupivacaine (MARCAINE) 0.5 % 10 mL, triamcinolone acetonide (KENALOG-40) 40 mg injection   Subcutaneous Once Carolan Clines, MD      . bupivacaine (MARCAINE) 0.5 % 15 mL, phenazopyridine (PYRIDIUM) 400 mg bladder mixture   Bladder Instillation Once Carolan Clines, MD      . cyanocobalamin ((VITAMIN B-12)) injection 1,000 mcg  1,000 mcg Intramuscular Q30 days [name], MD   1,000 mcg at 06/16/14 1324    ROS Review of Systems  Constitutional: Negative for fever, chills, diaphoresis, appetite change, fatigue and unexpected weight change.  HENT: Negative for congestion, ear pain, hearing loss, postnasal drip, rhinorrhea, sneezing, sore throat and trouble swallowing.   Eyes: Negative for pain.  Respiratory: Negative for cough, chest tightness and shortness of breath.   Cardiovascular: Negative for chest pain and palpitations.  Gastrointestinal: Negative for nausea, vomiting, abdominal pain, diarrhea and constipation.  Genitourinary: Negative for dysuria, frequency and menstrual problem.  Musculoskeletal: Negative for joint swelling and arthralgias.  Skin: Negative for rash.  Neurological: Negative for dizziness, weakness, numbness and headaches.  Psychiatric/Behavioral: Negative for dysphoric mood and agitation.    Objective:  BP 106/77 mmHg  Pulse 84  Temp(Src) 97.2 F (36.2 C) (Oral)  Ht 5' 7.5" (1.715 m)  Wt 115 lb 6.4 oz (52.345 kg)  BMI 17.80 kg/m2  BP  Readings from Last 3 Encounters:  08/18/14 106/77  07/11/14 96/65  06/16/14 98/65    Wt Readings from Last 3 Encounters:  08/18/14 115 lb 6.4 oz (52.345 kg)  07/11/14 115 lb (52.164 kg)  06/16/14 111 lb 9.6 oz (50.621 kg)     Physical Exam  Constitutional: She is oriented to person, place, and time. She appears well-developed and well-nourished. No distress.  HENT:  Head: Normocephalic and atraumatic.  Right Ear: External ear normal.  Left Ear: External ear normal.  Nose: Nose normal.  Mouth/Throat: Oropharynx is clear and moist.  Eyes: Conjunctivae and EOM are normal. Pupils are equal, round, and reactive to light.  Neck: Normal range of motion. Neck supple. No thyromegaly present.  Cardiovascular: Normal rate, regular rhythm and normal heart sounds.   No murmur heard. Pulmonary/Chest: Effort normal and breath sounds normal. No respiratory distress. She has no wheezes. She has no rales.  Abdominal: Soft. Bowel sounds are normal. She exhibits no distension. There is no tenderness.  Lymphadenopathy:    She has  no cervical adenopathy.  Neurological: She is alert and oriented to person, place, and time. She has normal reflexes.  Skin: Skin is warm and dry.  Psychiatric: She has a normal mood and affect. Her behavior is normal. Judgment and thought content normal.    No results found for: HGBA1C  Lab Results  Component Value Date   WBC 4.9 06/16/2014   HGB 11.6* 06/16/2014   HCT 35.7* 06/16/2014   PLT 195 02/14/2014   GLUCOSE 91 06/16/2014   CHOL 149 02/14/2014   TRIG 51 02/14/2014   HDL 51 02/14/2014   LDLDIRECT 99 02/14/2014   LDLCALC 88 02/14/2014   ALT 18 06/16/2014   AST 25 06/16/2014   NA 140 06/16/2014   K 4.5 06/16/2014   CL 98 06/16/2014   CREATININE 0.64 06/16/2014   BUN 13 06/16/2014   CO2 27 06/16/2014   TSH 0.402* 08/13/2014    Ct Angio Chest W/cm &/or Wo Cm  03/25/2013   CLINICAL DATA:  Persistent cough and shortness of breath status post sinus  surgery 2 months ago. Evaluate for pulmonary embolism.  EXAM: CT ANGIOGRAPHY CHEST WITH CONTRAST  TECHNIQUE: Multidetector CT imaging of the chest was performed using the standard protocol during bolus administration of intravenous contrast. Multiplanar CT image reconstructions including MIPs were obtained to evaluate the vascular anatomy.  CONTRAST:  45mL OMNIPAQUE IOHEXOL 350 MG/ML SOLN  COMPARISON:  Prior radiograph from 01/30/2013  FINDINGS: Subcentimeter calcific density noted within the left lobe of thyroid. The thyroid gland is otherwise unremarkable.  No pathologically enlarged mediastinal, hilar, or axillary lymph nodes are identified.  The aorta and great vessels demonstrate a normal contrast enhanced appearance. No evidence of aortic aneurysm, dissection, or other acute abnormality.  Heart size is within normal limits.  No pericardial effusion.  Pulmonary arterial tree is well opacified. No filling defects are seen to suggest acute pulmonary embolism. Re-formatted imaging confirms these findings.  The lungs are hyperinflated with attenuation of the pulmonary markings, compatible with emphysema. Oblique linear opacity within the left lung base is most compatible with atelectasis and/or scarring. No focal infiltrate identified. No pulmonary edema or pleural effusion.  The visualized portions of the upper abdomen are grossly unremarkable.  No acute osseous abnormality identified. Mild degenerative changes noted within the visualized spine. No focal lytic or blastic osseous lesions.  IMPRESSION: 1. No CT evidence of acute pulmonary embolism. 2. Emphysema.  No acute cardiopulmonary process identified.   Electronically Signed   By: Jeannine Boga M.D.   On: 03/25/2013 13:31    Assessment & Plan:   Reganne was seen today for gad, cough, temporomandibular joint pain and hyperthyroidism.  Diagnoses and all orders for this visit:  Abnormal thyroid function test  Slow transit constipation  TMJ  syndrome  Chronic cough  Sinus drainage  Other allergic rhinitis  Gastroesophageal reflux disease, esophagitis presence not specified  Other orders -     Linaclotide (LINZESS) 290 MCG CAPS capsule; TAKE 1 CAPSULE (290 MCG TOTAL) BY MOUTH DAILY.  I have changed Ms. Harbeck's LINZESS to Linaclotide. I am also having her maintain her pantoprazole, lidocaine, albuterol, guaiFENesin, Fish Oil, Cholecalciferol (VITAMIN D PO), Nutritional Supplements (DHEA PO), polyethylene glycol, [place]ON, fluticasone, calcium citrate-vitamin D, raloxifene, Calcium Glycerophosphate (PRELIEF PO), multivitamin, albuterol, budesonide-formoterol, traMADol, HYDROcodone-homatropine, cyclobenzaprine, celecoxib, benzonatate, aspirin-acetaminophen-caffeine, and fexofenadine. We will continue to administer cyanocobalamin.  Meds ordered this encounter  Medications  . Linaclotide (LINZESS) 290 MCG CAPS capsule    Sig: TAKE 1 CAPSULE (290 MCG  TOTAL) BY MOUTH DAILY.    Dispense:  30 capsule    Refill:  5     Follow-up: Return in about 3 months (around 11/18/2014).  [name], M.D.

## 2014-08-19 ENCOUNTER — Encounter: Payer: Self-pay | Admitting: Neurology

## 2014-08-28 ENCOUNTER — Ambulatory Visit: Payer: Medicare Other | Admitting: Internal Medicine

## 2014-08-29 DIAGNOSIS — M9903 Segmental and somatic dysfunction of lumbar region: Secondary | ICD-10-CM | POA: Diagnosis not present

## 2014-08-29 DIAGNOSIS — M9901 Segmental and somatic dysfunction of cervical region: Secondary | ICD-10-CM | POA: Diagnosis not present

## 2014-08-29 DIAGNOSIS — M5030 Other cervical disc degeneration, unspecified cervical region: Secondary | ICD-10-CM | POA: Diagnosis not present

## 2014-08-29 DIAGNOSIS — M5408 Panniculitis affecting regions of neck and back, sacral and sacrococcygeal region: Secondary | ICD-10-CM | POA: Diagnosis not present

## 2014-08-29 DIAGNOSIS — M5416 Radiculopathy, lumbar region: Secondary | ICD-10-CM | POA: Diagnosis not present

## 2014-08-29 DIAGNOSIS — M9902 Segmental and somatic dysfunction of thoracic region: Secondary | ICD-10-CM | POA: Diagnosis not present

## 2014-08-29 DIAGNOSIS — M5136 Other intervertebral disc degeneration, lumbar region: Secondary | ICD-10-CM | POA: Diagnosis not present

## 2014-09-05 DIAGNOSIS — N3010 Interstitial cystitis (chronic) without hematuria: Secondary | ICD-10-CM | POA: Diagnosis not present

## 2014-09-08 ENCOUNTER — Ambulatory Visit (INDEPENDENT_AMBULATORY_CARE_PROVIDER_SITE_OTHER): Payer: Medicare Other | Admitting: Family Medicine

## 2014-09-08 ENCOUNTER — Encounter: Payer: Self-pay | Admitting: Family Medicine

## 2014-09-08 VITALS — BP 103/65 | HR 84 | Temp 98.0°F | Ht 67.5 in | Wt 114.8 lb

## 2014-09-08 DIAGNOSIS — M2669 Other specified disorders of temporomandibular joint: Secondary | ICD-10-CM | POA: Diagnosis not present

## 2014-09-08 DIAGNOSIS — N3010 Interstitial cystitis (chronic) without hematuria: Secondary | ICD-10-CM | POA: Diagnosis not present

## 2014-09-08 DIAGNOSIS — M26629 Arthralgia of temporomandibular joint, unspecified side: Secondary | ICD-10-CM

## 2014-09-08 DIAGNOSIS — M1990 Unspecified osteoarthritis, unspecified site: Secondary | ICD-10-CM | POA: Diagnosis not present

## 2014-09-08 DIAGNOSIS — M25579 Pain in unspecified ankle and joints of unspecified foot: Secondary | ICD-10-CM | POA: Diagnosis not present

## 2014-09-08 DIAGNOSIS — R252 Cramp and spasm: Secondary | ICD-10-CM | POA: Diagnosis not present

## 2014-09-08 MED ORDER — CELECOXIB 200 MG PO CAPS: 200.0000 mg | ORAL_CAPSULE | Freq: Two times a day (BID) | ORAL | Status: DC

## 2014-09-08 NOTE — Progress Notes (Signed)
Subjective:  Patient ID: [name], female    DOB: 04/03/1950  Age: 64 y.o. MRN: [id number]  CC: Foot Pain   HPI Kristy Garza presents for onset this morning of bilateral foot pain. It was there when she woke up. Activities had been pretty much normal yesterday. Patient use some Aspercreme on him and they got better. Concerned about ongoing joint pains including neck, TMJ and feet. She has been diagnosed with arthritis in the past. Has questions about the usefulness of acupuncture. She was told she was dry and side by the [place] acupuncturist and should drink warm water. Her period on test is treating her TMJ. He had given her Celebrex to take 100 mg twice daily. She is out of that and wonders if it would be useful at this time. She is currently taking magnesium 250 mg daily for leg cramps. She is not sure if it's helping. Additionally she takes zinc but her mouth remains dry in spite of 50 mg daily.  History Kristy Garza has a past medical history of Asthma; Osteoporosis; Acid reflux; PONV (postoperative nausea and vomiting); Tinnitus; Allergy; Interstitial cystitis; Trigeminal neuralgia; and Arthritis.   She has past surgical history that includes Abdominal hysterectomy (2000); vocal cord surgery  (3295'J); Ethmoidectomy (2012); Septoplasty (1980's); and Cystoscopy with hydrodistension and biopsy (N/A, 06/11/2012).   Her family history includes Allergies in her father, sister, and sister; Arthritis in her mother; Cancer in her father; Hyperlipidemia in her mother.She reports that she has never smoked. She has never used smokeless tobacco. She reports that she does not drink alcohol or use illicit drugs.  Outpatient Prescriptions Prior to Visit  Medication Sig Dispense Refill  . albuterol (PROVENTIL HFA;VENTOLIN HFA) 108 (90 BASE) MCG/ACT inhaler Inhale 2 puffs into the lungs every 4 (four) hours as needed for wheezing or shortness of breath. 1 Inhaler prn  . albuterol (PROVENTIL) (2.5  MG/3ML) 0.083% nebulizer solution Take 3 mLs (2.5 mg total) by nebulization every 6 (six) hours as needed for wheezing or shortness of breath. 150 mL 1  . aspirin-acetaminophen-caffeine (EXCEDRIN MIGRAINE) 884-166-06 MG per tablet Take 1 tablet by mouth every 6 (six) hours as needed for headache.    . benzonatate (TESSALON) 200 MG capsule Take 200 mg by mouth 3 (three) times daily.    . budesonide-formoterol (SYMBICORT) 80-4.5 MCG/ACT inhaler Inhale 2 puffs into the lungs 2 (two) times daily. 1 Inhaler 3  . calcium citrate-vitamin D (CITRACAL+D) 315-200 MG-UNIT per tablet Take 1 tablet by mouth.    . Calcium Glycerophosphate (PRELIEF PO) Take 1 tablet by mouth as needed (prior to acid containing foods).    . Cholecalciferol (VITAMIN D PO) Take 4,000 Units by mouth daily.     . cyclobenzaprine (FLEXERIL) 5 MG tablet Take 5 mg by mouth 3 (three) times daily as needed for muscle spasms.    [name] ELMIRON 100 MG capsule Take 100 mg by mouth 2 (two) times daily.     . fexofenadine (ALLEGRA) 60 MG tablet Take 60 mg by mouth 2 (two) times daily.    . fluticasone (FLONASE) 50 MCG/ACT nasal spray Place 2 sprays into both nostrils daily.     [name] guaiFENesin (MUCINEX) 600 MG 12 hr tablet Take 600 mg by mouth 2 (two) times daily.    [name] HYDROcodone-homatropine (HYCODAN) 5-1.5 MG/5ML syrup Take 5 mLs by mouth every 6 (six) hours as needed for cough. 140 mL 0  . lidocaine (XYLOCAINE) 2 % jelly Apply topically as needed.    [name]  Linaclotide (LINZESS) 290 MCG CAPS capsule TAKE 1 CAPSULE (290 MCG TOTAL) BY MOUTH DAILY. 30 capsule 5  . Multiple Vitamin (MULTIVITAMIN) tablet Take 1 tablet by mouth daily.    . Nutritional Supplements (DHEA PO) Take 5 mg by mouth daily.     . Omega-3 Fatty Acids (FISH OIL) 1000 MG CAPS Take 1 capsule by mouth 2 (two) times daily.     . pantoprazole (PROTONIX) 40 MG tablet Take 40 mg by mouth daily.    . polyethylene glycol (MIRALAX / GLYCOLAX) packet Take 17 g by mouth daily as needed.    .  raloxifene (EVISTA) 60 MG tablet Take one po qd 30 tablet 2  . traMADol (ULTRAM) 50 MG tablet Take 1 tablet (50 mg total) by mouth every 6 (six) hours as needed. 15 tablet 1  . celecoxib (CELEBREX) 100 MG capsule Take 100 mg by mouth 2 (two) times daily.     Facility-Administered Medications Prior to Visit  Medication Dose Route Frequency Provider Last Rate Last Dose  . bupivacaine (MARCAINE) 0.5 % 10 mL, triamcinolone acetonide (KENALOG-40) 40 mg injection   Subcutaneous Once Carolan Clines, MD      . bupivacaine (MARCAINE) 0.5 % 15 mL, phenazopyridine (PYRIDIUM) 400 mg bladder mixture   Bladder Instillation Once Carolan Clines, MD      . cyanocobalamin ((VITAMIN B-12)) injection 1,000 mcg  1,000 mcg Intramuscular Q30 days [name], MD   1,000 mcg at 06/16/14 1324    ROS Review of Systems  Constitutional: Negative for fever, chills, diaphoresis, appetite change and unexpected weight change.  HENT: Negative for congestion, sore throat and trouble swallowing.   Eyes: Negative for pain.  Respiratory: Negative for cough, chest tightness and shortness of breath.   Cardiovascular: Negative for chest pain and palpitations.  Gastrointestinal: Negative for nausea, vomiting, diarrhea and constipation.  Genitourinary: Negative for dysuria, frequency and menstrual problem.  Musculoskeletal: Positive for myalgias, joint swelling and arthralgias.  Skin: Negative for rash.  Neurological: Negative for dizziness, weakness, numbness and headaches.  Psychiatric/Behavioral: Negative for dysphoric mood and agitation.    Objective:  BP 103/65 mmHg  Pulse 84  Temp(Src) 98 F (36.7 C) (Oral)  Ht 5' 7.5" (1.715 m)  Wt 114 lb 12.8 oz (52.073 kg)  BMI 17.70 kg/m2  BP Readings from Last 3 Encounters:  09/08/14 103/65  08/18/14 106/77  07/11/14 96/65    Wt Readings from Last 3 Encounters:  09/08/14 114 lb 12.8 oz (52.073 kg)  08/18/14 115 lb 6.4 oz (52.345 kg)  07/11/14 115 lb (52.164  kg)     Physical Exam  Constitutional: She is oriented to person, place, and time. She appears well-developed and well-nourished. No distress.  HENT:  Head: Normocephalic and atraumatic.  Right Ear: External ear normal.  Left Ear: External ear normal.  Nose: Nose normal.  Mouth/Throat: Oropharynx is clear and moist.  Eyes: Conjunctivae and EOM are normal. Pupils are equal, round, and reactive to light.  Neck: Normal range of motion. Neck supple. No thyromegaly present.  Cardiovascular: Normal rate, regular rhythm and normal heart sounds.   No murmur heard. Pulmonary/Chest: Effort normal and breath sounds normal. No respiratory distress. She has no wheezes. She has no rales.  Abdominal: Soft. Bowel sounds are normal. She exhibits no distension. There is no tenderness.  Musculoskeletal: She exhibits no edema or tenderness (feet free of lesion nontender with full range of motion at the MCP joints at the balls of the feet bilaterally).  Lymphadenopathy:  She has no cervical adenopathy.  Neurological: She is alert and oriented to person, place, and time. She has normal reflexes.  Skin: Skin is warm and dry.  Psychiatric: She has a normal mood and affect. Her behavior is normal. Judgment and thought content normal.    No results found for: HGBA1C  Lab Results  Component Value Date   WBC 4.9 06/16/2014   HGB 11.6* 06/16/2014   HCT 35.7* 06/16/2014   PLT 195 02/14/2014   GLUCOSE 91 06/16/2014   CHOL 149 02/14/2014   TRIG 51 02/14/2014   HDL 51 02/14/2014   LDLDIRECT 99 02/14/2014   LDLCALC 88 02/14/2014   ALT 18 06/16/2014   AST 25 06/16/2014   NA 140 06/16/2014   K 4.5 06/16/2014   CL 98 06/16/2014   CREATININE 0.64 06/16/2014   BUN 13 06/16/2014   CO2 27 06/16/2014   TSH 0.402* 08/13/2014    Ct Angio Chest W/cm &/or Wo Cm  03/25/2013   CLINICAL DATA:  Persistent cough and shortness of breath status post sinus surgery 2 months ago. Evaluate for pulmonary embolism.   EXAM: CT ANGIOGRAPHY CHEST WITH CONTRAST  TECHNIQUE: Multidetector CT imaging of the chest was performed using the standard protocol during bolus administration of intravenous contrast. Multiplanar CT image reconstructions including MIPs were obtained to evaluate the vascular anatomy.  CONTRAST:  58mL OMNIPAQUE IOHEXOL 350 MG/ML SOLN  COMPARISON:  Prior radiograph from 01/30/2013  FINDINGS: Subcentimeter calcific density noted within the left lobe of thyroid. The thyroid gland is otherwise unremarkable.  No pathologically enlarged mediastinal, hilar, or axillary lymph nodes are identified.  The aorta and great vessels demonstrate a normal contrast enhanced appearance. No evidence of aortic aneurysm, dissection, or other acute abnormality.  Heart size is within normal limits.  No pericardial effusion.  Pulmonary arterial tree is well opacified. No filling defects are seen to suggest acute pulmonary embolism. Re-formatted imaging confirms these findings.  The lungs are hyperinflated with attenuation of the pulmonary markings, compatible with emphysema. Oblique linear opacity within the left lung base is most compatible with atelectasis and/or scarring. No focal infiltrate identified. No pulmonary edema or pleural effusion.  The visualized portions of the upper abdomen are grossly unremarkable.  No acute osseous abnormality identified. Mild degenerative changes noted within the visualized spine. No focal lytic or blastic osseous lesions.  IMPRESSION: 1. No CT evidence of acute pulmonary embolism. 2. Emphysema.  No acute cardiopulmonary process identified.   Electronically Signed   By: Jeannine Boga M.D.   On: 03/25/2013 13:31    Assessment & Plan:   Gauri was seen today for foot pain.  Diagnoses and all orders for this visit:  Pain in joint, ankle and foot, unspecified laterality  Cramp of both lower extremities  TMJ syndrome  Arthritis  Other orders -     celecoxib (CELEBREX) 200 MG capsule;  Take 1 capsule (200 mg total) by mouth 2 (two) times daily.  I have changed Kristy Garza's celecoxib. I am also having her maintain her pantoprazole, lidocaine, albuterol, guaiFENesin, Fish Oil, Cholecalciferol (VITAMIN D PO), Nutritional Supplements (DHEA PO), polyethylene glycol, ELMIRON, fluticasone, calcium citrate-vitamin D, raloxifene, Calcium Glycerophosphate (PRELIEF PO), multivitamin, albuterol, budesonide-formoterol, traMADol, HYDROcodone-homatropine, cyclobenzaprine, benzonatate, aspirin-acetaminophen-caffeine, fexofenadine, and Linaclotide. We will continue to administer cyanocobalamin.  Meds ordered this encounter  Medications  . celecoxib (CELEBREX) 200 MG capsule    Sig: Take 1 capsule (200 mg total) by mouth 2 (two) times daily.    Dispense:  30 capsule  Refill:  5     Follow-up: Return in about 3 months (around 12/09/2014), or if symptoms worsen or fail to improve.  [name], M.D.

## 2014-09-10 DIAGNOSIS — M9903 Segmental and somatic dysfunction of lumbar region: Secondary | ICD-10-CM | POA: Diagnosis not present

## 2014-09-10 DIAGNOSIS — M9901 Segmental and somatic dysfunction of cervical region: Secondary | ICD-10-CM | POA: Diagnosis not present

## 2014-09-10 DIAGNOSIS — M9902 Segmental and somatic dysfunction of thoracic region: Secondary | ICD-10-CM | POA: Diagnosis not present

## 2014-09-10 DIAGNOSIS — M5408 Panniculitis affecting regions of neck and back, sacral and sacrococcygeal region: Secondary | ICD-10-CM | POA: Diagnosis not present

## 2014-09-10 DIAGNOSIS — M5136 Other intervertebral disc degeneration, lumbar region: Secondary | ICD-10-CM | POA: Diagnosis not present

## 2014-09-10 DIAGNOSIS — M5416 Radiculopathy, lumbar region: Secondary | ICD-10-CM | POA: Diagnosis not present

## 2014-09-10 DIAGNOSIS — M5030 Other cervical disc degeneration, unspecified cervical region: Secondary | ICD-10-CM | POA: Diagnosis not present

## 2014-09-11 ENCOUNTER — Other Ambulatory Visit: Payer: Self-pay

## 2014-09-11 ENCOUNTER — Telehealth: Payer: Self-pay

## 2014-09-11 ENCOUNTER — Telehealth: Payer: Self-pay | Admitting: Family Medicine

## 2014-09-11 DIAGNOSIS — M1990 Unspecified osteoarthritis, unspecified site: Secondary | ICD-10-CM

## 2014-09-11 MED ORDER — CELECOXIB 100 MG PO CAPS: 100.0000 mg | ORAL_CAPSULE | Freq: Every day | ORAL | Status: DC

## 2014-09-11 NOTE — Telephone Encounter (Signed)
This would be a decision for Dr Livia Snellen  Also, i think these are capsules

## 2014-09-11 NOTE — Telephone Encounter (Signed)
Pt aware by detailed VM - and med sent to [facility].

## 2014-09-11 NOTE — Telephone Encounter (Signed)
Patient would like you to set her up with a Rheumatologist

## 2014-09-11 NOTE — Telephone Encounter (Signed)
If she prefers, please call in the 100 mg capsule. Sig would be one daily with food #30, 2 refills

## 2014-09-11 NOTE — Telephone Encounter (Signed)
Please arrange referral. Thanks, WS.

## 2014-09-12 ENCOUNTER — Ambulatory Visit: Payer: Medicare Other | Admitting: Family

## 2014-09-12 ENCOUNTER — Telehealth: Payer: Self-pay | Admitting: Family Medicine

## 2014-09-12 NOTE — Telephone Encounter (Signed)
Appt given per patient request 

## 2014-09-13 ENCOUNTER — Ambulatory Visit (INDEPENDENT_AMBULATORY_CARE_PROVIDER_SITE_OTHER): Payer: Medicare Other | Admitting: Family

## 2014-09-13 VITALS — BP 106/69 | HR 67 | Temp 97.3°F | Ht 67.5 in | Wt 113.0 lb

## 2014-09-13 DIAGNOSIS — M1990 Unspecified osteoarthritis, unspecified site: Secondary | ICD-10-CM

## 2014-09-13 MED ORDER — TRAMADOL HCL 50 MG PO TABS: 50.0000 mg | ORAL_TABLET | Freq: Four times a day (QID) | ORAL | Status: DC | PRN

## 2014-09-13 MED ORDER — CELECOXIB 200 MG PO CAPS: 200.0000 mg | ORAL_CAPSULE | Freq: Two times a day (BID) | ORAL | Status: DC

## 2014-09-13 MED ORDER — METHYLPREDNISOLONE ACETATE 40 MG/ML IJ SUSP: 40.0000 mg | Freq: Once | INTRAMUSCULAR | Status: DC

## 2014-09-13 MED ORDER — METHYLPREDNISOLONE ACETATE 80 MG/ML IJ SUSP
40.0000 mg | Freq: Once | INTRAMUSCULAR | Status: AC
  Administered 2014-09-13: 40 mg via INTRAMUSCULAR

## 2014-09-13 NOTE — Progress Notes (Signed)
   Subjective:    Patient ID: Kristy Garza, female    DOB: 07-Mar-1951, 64 y.o.   MRN: [id number]  Pt presents to the office today with complaints of arthritis. Pt saw Dr. Livia Snellen on 09/08/14 and was given RX of celebrex 200 mg BID. Pt also would like to discuss a steroid injection.  Arthritis Presents for follow-up visit. She complains of pain, stiffness and joint warmth. Affected locations include the neck, left wrist, right foot and left foot. Her pain is at a severity of 7/10. Associated symptoms include dry mouth. Pertinent negatives include no dry eyes or pain at night. Her past medical history is significant for osteoarthritis. Past treatments include rest and NSAIDs. The treatment provided mild relief.      Review of Systems  Constitutional: Negative.   Eyes: Negative.   Respiratory: Negative.  Negative for shortness of breath.   Cardiovascular: Negative.  Negative for palpitations.  Gastrointestinal: Negative.   Endocrine: Negative.   Genitourinary: Negative.   Musculoskeletal: Positive for arthritis and stiffness.  Neurological: Negative.  Negative for headaches.  Hematological: Negative.   Psychiatric/Behavioral: Negative.   All other systems reviewed and are negative.      Objective:   Physical Exam  Constitutional: She is oriented to person, place, and time. She appears well-developed and well-nourished. No distress.  HENT:  Head: Normocephalic and atraumatic.  Right Ear: External ear normal.  Left Ear: External ear normal.  Nose: Nose normal.  Mouth/Throat: Oropharynx is clear and moist.  Eyes: Pupils are equal, round, and reactive to light.  Neck: Normal range of motion. Neck supple. No thyromegaly present.  Cardiovascular: Normal rate, regular rhythm, normal heart sounds and intact distal pulses.   No murmur heard. Pulmonary/Chest: Effort normal and breath sounds normal. No respiratory distress. She has no wheezes.  Abdominal: Soft. Bowel sounds are normal.  She exhibits no distension. There is no tenderness.  Musculoskeletal: Normal range of motion. She exhibits no edema or tenderness.  Neurological: She is alert and oriented to person, place, and time. She has normal reflexes. No cranial nerve deficit.  Skin: Skin is warm and dry.  Psychiatric: She has a normal mood and affect. Her behavior is normal. Judgment and thought content normal.  Vitals reviewed.     BP 106/69 mmHg  Pulse 67  Temp(Src) 97.3 F (36.3 C) (Oral)  Ht 5' 7.5" (1.715 m)  Wt 113 lb (51.256 kg)  BMI 17.43 kg/m2     Assessment & Plan:  1. Arthritis -Celebrex increased to 200 mg from 100mg -Pt to take with food -Encourage low impact excerise -RTO prn - Arthritis Panel - methylPREDNISolone acetate (DEPO-MEDROL) injection 40 mg; Inject 1 mL (40 mg total) into the muscle once. - traMADol (ULTRAM) 50 MG tablet; Take 1 tablet (50 mg total) by mouth every 6 (six) hours as needed.  Dispense: 30 tablet; Refill: 1 - celecoxib (CELEBREX) 200 MG capsule; Take 1 capsule (200 mg total) by mouth 2 (two) times daily.  Dispense: 60 capsule; Refill: Ages, FNP

## 2014-09-13 NOTE — Patient Instructions (Signed)
Arthritis, Nonspecific °Arthritis is inflammation of a joint. This usually means pain, redness, warmth or swelling are present. One or more joints may be involved. There are a number of types of arthritis. Your caregiver may not be able to tell what type of arthritis you have right away. °CAUSES  °The most common cause of arthritis is the wear and tear on the joint (osteoarthritis). This causes damage to the cartilage, which can break down over time. The knees, hips, back and neck are most often affected by this type of arthritis. °Other types of arthritis and common causes of joint pain include: °· Sprains and other injuries near the joint. Sometimes minor sprains and injuries cause pain and swelling that develop hours later. °· Rheumatoid arthritis. This affects hands, feet and knees. It usually affects both sides of your body at the same time. It is often associated with chronic ailments, fever, weight loss and general weakness. °· Crystal arthritis. Gout and pseudo gout can cause occasional acute severe pain, redness and swelling in the foot, ankle, or knee. °· Infectious arthritis. Bacteria can get into a joint through a break in overlying skin. This can cause infection of the joint. Bacteria and viruses can also spread through the blood and affect your joints. °· Drug, infectious and allergy reactions. Sometimes joints can become mildly painful and slightly swollen with these types of illnesses. °SYMPTOMS  °· Pain is the main symptom. °· Your joint or joints can also be red, swollen and warm or hot to the touch. °· You may have a fever with certain types of arthritis, or even feel overall ill. °· The joint with arthritis will hurt with movement. Stiffness is present with some types of arthritis. °DIAGNOSIS  °Your caregiver will suspect arthritis based on your description of your symptoms and on your exam. Testing may be needed to find the type of arthritis: °· Blood and sometimes urine tests. °· X-ray tests  and sometimes CT or MRI scans. °· Removal of fluid from the joint (arthrocentesis) is done to check for bacteria, crystals or other causes. Your caregiver (or a specialist) will numb the area over the joint with a local anesthetic, and use a needle to remove joint fluid for examination. This procedure is only minimally uncomfortable. °· Even with these tests, your caregiver may not be able to tell what kind of arthritis you have. Consultation with a specialist (rheumatologist) may be helpful. °TREATMENT  °Your caregiver will discuss with you treatment specific to your type of arthritis. If the specific type cannot be determined, then the following general recommendations may apply. °Treatment of severe joint pain includes: °· Rest. °· Elevation. °· Anti-inflammatory medication (for example, ibuprofen) may be prescribed. Avoiding activities that cause increased pain. °· Only take over-the-counter or prescription medicines for pain and discomfort as recommended by your caregiver. °· Cold packs over an inflamed joint may be used for 10 to 15 minutes every hour. Hot packs sometimes feel better, but do not use overnight. Do not use hot packs if you are diabetic without your caregiver's permission. °· A cortisone shot into arthritic joints may help reduce pain and swelling. °· Any acute arthritis that gets worse over the next 1 to 2 days needs to be looked at to be sure there is no joint infection. °Long-term arthritis treatment involves modifying activities and lifestyle to reduce joint stress jarring. This can include weight loss. Also, exercise is needed to nourish the joint cartilage and remove waste. This helps keep the muscles   around the joint strong. °HOME CARE INSTRUCTIONS  °· Do not take aspirin to relieve pain if gout is suspected. This elevates uric acid levels. °· Only take over-the-counter or prescription medicines for pain, discomfort or fever as directed by your caregiver. °· Rest the joint as much as  possible. °· If your joint is swollen, keep it elevated. °· Use crutches if the painful joint is in your leg. °· Drinking plenty of fluids may help for certain types of arthritis. °· Follow your caregiver's dietary instructions. °· Try low-impact exercise such as: °¨ Swimming. °¨ Water aerobics. °¨ Biking. °¨ Walking. °· Morning stiffness is often relieved by a warm shower. °· Put your joints through regular range-of-motion. °SEEK MEDICAL CARE IF:  °· You do not feel better in 24 hours or are getting worse. °· You have side effects to medications, or are not getting better with treatment. °SEEK IMMEDIATE MEDICAL CARE IF:  °· You have a fever. °· You develop severe joint pain, swelling or redness. °· Many joints are involved and become painful and swollen. °· There is severe back pain and/or leg weakness. °· You have loss of bowel or bladder control. °Document Released: 04/21/2004 Document Revised: 06/06/2011 Document Reviewed: 05/07/2008 °ExitCare® Patient Information ©2015 [employer]. This information is not intended to replace advice given to you by your health care provider. Make sure you discuss any questions you have with your health care provider. ° °

## 2014-09-13 NOTE — Addendum Note (Signed)
Addended by: Ilean China on: 09/13/2014 10:38 AM   Modules accepted: Orders

## 2014-09-14 LAB — ARTHRITIS PANEL
BASOS: 1 %
Basophils Absolute: 0 10*3/uL (ref 0.0–0.2)
EOS (ABSOLUTE): 0.1 10*3/uL (ref 0.0–0.4)
EOS: 2 %
HEMATOCRIT: 34.8 % (ref 34.0–46.6)
HEMOGLOBIN: 11.3 g/dL (ref 11.1–15.9)
Immature Grans (Abs): 0 10*3/uL (ref 0.0–0.1)
Immature Granulocytes: 0 %
LYMPHS ABS: 0.8 10*3/uL (ref 0.7–3.1)
LYMPHS: 19 %
MCH: 30.1 pg (ref 26.6–33.0)
MCHC: 32.5 g/dL (ref 31.5–35.7)
MCV: 93 fL (ref 79–97)
Monocytes Absolute: 0.4 10*3/uL (ref 0.1–0.9)
Monocytes: 11 %
Neutrophils Absolute: 2.8 10*3/uL (ref 1.4–7.0)
Neutrophils: 67 %
Platelets: 240 10*3/uL (ref 150–379)
RBC: 3.76 x10E6/uL — ABNORMAL LOW (ref 3.77–5.28)
RDW: 12.6 % (ref 12.3–15.4)
Rhuematoid fact SerPl-aCnc: <7 IU/mL (ref 0.0–13.9)
SED RATE: 2 mm/h (ref 0–40)
Uric Acid: 3.7 mg/dL (ref 2.5–7.1)
WBC: 4.1 10*3/uL (ref 3.4–10.8)

## 2014-09-15 ENCOUNTER — Telehealth: Payer: Self-pay | Admitting: Family Medicine

## 2014-09-15 NOTE — Telephone Encounter (Signed)
Spoke with pt- states since getting steroid injection last Saturday she has been feeling nervous and jittery.  Advised patient that this is a common side effect of steroid injections, and may take a few more days to wear off.  She is concerned it could be the increase in the celebrex as well.  She wants to know if she can go back to the 100 mg celebrex.  Advised she can try decreasing back to 100 mg celebrex twice daily to see if this helps, and then increase back to celebrex 200 mg twice daily when jittery feeling has resolved.  Asked patient to call back if the jittery feeling persists.

## 2014-09-17 ENCOUNTER — Telehealth: Payer: Self-pay | Admitting: Family Medicine

## 2014-09-17 MED ORDER — TERIPARATIDE (RECOMBINANT) 600 MCG/2.4ML ~~LOC~~ SOLN: 20.0000 ug | Freq: Every day | SUBCUTANEOUS | Status: DC

## 2014-09-17 NOTE — Telephone Encounter (Signed)
Patient would like to take Forteo in place of raloxifene.  She has tried Comptroller for 1 month in the past but she is willing to retry now.  Patient also had lots of questions about vitamins - B12, D and magnesium.  Questions answered.   Rx for Forteo sent in.

## 2014-09-20 ENCOUNTER — Telehealth: Payer: Self-pay | Admitting: Family Medicine

## 2014-09-20 NOTE — Telephone Encounter (Signed)
Hycodan and Flexeril could possibly play a role with dry mouth

## 2014-09-20 NOTE — Telephone Encounter (Signed)
Pt aware to try biotene. push fluids. She wants to know of any of her meds could be causing dry mouth.  She preferred i ask [facility]  //// stacks pt.

## 2014-09-22 ENCOUNTER — Ambulatory Visit: Payer: Medicare Other | Admitting: Physician Assistant

## 2014-09-25 ENCOUNTER — Telehealth: Payer: Self-pay

## 2014-09-25 DIAGNOSIS — M5136 Other intervertebral disc degeneration, lumbar region: Secondary | ICD-10-CM | POA: Diagnosis not present

## 2014-09-25 DIAGNOSIS — M9902 Segmental and somatic dysfunction of thoracic region: Secondary | ICD-10-CM | POA: Diagnosis not present

## 2014-09-25 DIAGNOSIS — M9901 Segmental and somatic dysfunction of cervical region: Secondary | ICD-10-CM | POA: Diagnosis not present

## 2014-09-25 DIAGNOSIS — M9903 Segmental and somatic dysfunction of lumbar region: Secondary | ICD-10-CM | POA: Diagnosis not present

## 2014-09-25 DIAGNOSIS — M5416 Radiculopathy, lumbar region: Secondary | ICD-10-CM | POA: Diagnosis not present

## 2014-09-25 DIAGNOSIS — M5030 Other cervical disc degeneration, unspecified cervical region: Secondary | ICD-10-CM | POA: Diagnosis not present

## 2014-09-25 DIAGNOSIS — M5408 Panniculitis affecting regions of neck and back, sacral and sacrococcygeal region: Secondary | ICD-10-CM | POA: Diagnosis not present

## 2014-09-25 NOTE — Telephone Encounter (Signed)
Insurance approved Forteo

## 2014-09-26 ENCOUNTER — Encounter: Payer: Self-pay | Admitting: Physician Assistant

## 2014-09-26 ENCOUNTER — Ambulatory Visit (INDEPENDENT_AMBULATORY_CARE_PROVIDER_SITE_OTHER): Payer: Medicare Other | Admitting: Physician Assistant

## 2014-09-26 VITALS — BP 104/71 | HR 81 | Temp 97.7°F | Ht 67.5 in | Wt 109.0 lb

## 2014-09-26 DIAGNOSIS — R5383 Other fatigue: Secondary | ICD-10-CM | POA: Diagnosis not present

## 2014-09-26 DIAGNOSIS — R300 Dysuria: Secondary | ICD-10-CM | POA: Diagnosis not present

## 2014-09-26 LAB — POCT URINALYSIS DIPSTICK
Bilirubin, UA: NEGATIVE
Glucose, UA: NEGATIVE
KETONES UA: NEGATIVE
Leukocytes, UA: NEGATIVE
Nitrite, UA: NEGATIVE
PROTEIN UA: NEGATIVE
RBC UA: NEGATIVE
Spec Grav, UA: 1.01
UROBILINOGEN UA: NEGATIVE
pH, UA: 6.5

## 2014-09-26 LAB — POCT UA - MICROSCOPIC ONLY
Bacteria, U Microscopic: NEGATIVE
Casts, Ur, LPF, POC: NEGATIVE
Crystals, Ur, HPF, POC: NEGATIVE
Mucus, UA: NEGATIVE
RBC, urine, microscopic: NEGATIVE
WBC, UR, HPF, POC: NEGATIVE
Yeast, UA: NEGATIVE

## 2014-09-26 NOTE — Progress Notes (Signed)
Subjective:    Patient ID: Kristy Garza, female    DOB: 1951-01-13, 64 y.o.   MRN: [id number]  HPI 64 y/o female presents with c/o increased fatigue x 3 weeks. She was seen in office and given a steroid injection for her arthritis.   She also has c/o white tongue and dry mouth.   C/o cough x several years.   She also got a tick off of her and would like to be tested for lymes.     Review of Systems  Constitutional: Negative.   HENT: Negative for congestion, postnasal drip, rhinorrhea, sinus pressure, sneezing and sore throat.        Dry mouth   Eyes: Positive for redness. Negative for itching.  Respiratory: Positive for cough (dry, nonproductive ). Negative for shortness of breath and wheezing.   Cardiovascular: Negative.   Gastrointestinal: Positive for constipation. Negative for nausea, vomiting, abdominal pain, diarrhea, blood in stool, abdominal distention, anal bleeding and rectal pain.  Endocrine: Negative for polyuria.  Genitourinary: Positive for dysuria. Negative for urgency, frequency, hematuria, flank pain and difficulty urinating.  Psychiatric/Behavioral: Positive for sleep disturbance.       Objective:   Physical Exam  Constitutional: She is oriented to person, place, and time. She appears well-developed and well-nourished. No distress.  HENT:  Head: Normocephalic.  Moist mucous membranes but overall dry tongue and mouth   Cardiovascular: Normal rate, regular rhythm and normal heart sounds.  Exam reveals no gallop and no friction rub.   No murmur heard. Pulmonary/Chest: Effort normal. No respiratory distress. She has no wheezes. She has no rales. She exhibits no tenderness.  Distant BS at bilateral bases   Musculoskeletal: She exhibits no edema.  Neurological: She is alert and oriented to person, place, and time.  Skin: She is not diaphoretic.  Psychiatric: She has a normal mood and affect. Her behavior is normal. Judgment and thought content normal.    Nursing note and vitals reviewed.  Results for orders placed or performed in visit on 09/26/14  Lyme Ab/Western Blot Reflex  Result Value Ref Range   Lyme IgG/IgM Ab <0.91 0.00 - 0.90 ISR   LYME DISEASE AB, QUANT, IGM <0.80 0.00 - 0.79 index  POCT UA - Microscopic Only  Result Value Ref Range   WBC, Ur, HPF, POC neg    RBC, urine, microscopic neg    Bacteria, U Microscopic neg    Mucus, UA neg    Epithelial cells, urine per micros occ    Crystals, Ur, HPF, POC neg    Casts, Ur, LPF, POC neg    Yeast, UA neg   POCT urinalysis dipstick  Result Value Ref Range   Color, UA yellow    Clarity, UA clear    Glucose, UA neg    Bilirubin, UA neg    Ketones, UA neg    Spec Grav, UA 1.010    Blood, UA neg    pH, UA 6.5    Protein, UA neg    Urobilinogen, UA negative    Nitrite, UA neg    Leukocytes, UA Negative Negative          Assessment & Plan:  1. Dysuria  - Lyme Ab/Western Blot Reflex - POCT UA - Microscopic Only - POCT urinalysis dipstick  2. Other fatigue  - Lyme Ab/Western Blot Reflex - Thyroid levels checked in May 2016 were slightly below normal. If symptoms continue, will repeat  Drink plenty of fluids. Most likely viral etiology  3. Dry Mouth - Biotene rinse. Most likely due to medications. Does not appear to be dehydrated.    Thia Olesen A. Benjamin Stain PA-C

## 2014-09-30 LAB — LYME AB/WESTERN BLOT REFLEX: LYME DISEASE AB, QUANT, IGM: <0.8 index (ref 0.00–0.79)

## 2014-10-02 ENCOUNTER — Telehealth: Payer: Self-pay | Admitting: Family Medicine

## 2014-10-02 NOTE — Telephone Encounter (Signed)
Noted - i have discussed multiple options to treat osteoporosis with patient.  She is considering options and will let me know when she make decision about treatment.

## 2014-10-04 ENCOUNTER — Telehealth: Payer: Self-pay | Admitting: Family Medicine

## 2014-10-04 NOTE — Telephone Encounter (Signed)
Plantars warts? If this is what she is implying, she should go to a podiatrist. Anicia Leuthold A. Benjamin Stain PA-C

## 2014-10-06 DIAGNOSIS — M9902 Segmental and somatic dysfunction of thoracic region: Secondary | ICD-10-CM | POA: Diagnosis not present

## 2014-10-06 DIAGNOSIS — M5136 Other intervertebral disc degeneration, lumbar region: Secondary | ICD-10-CM | POA: Diagnosis not present

## 2014-10-06 DIAGNOSIS — M9903 Segmental and somatic dysfunction of lumbar region: Secondary | ICD-10-CM | POA: Diagnosis not present

## 2014-10-06 DIAGNOSIS — M5408 Panniculitis affecting regions of neck and back, sacral and sacrococcygeal region: Secondary | ICD-10-CM | POA: Diagnosis not present

## 2014-10-06 DIAGNOSIS — M5416 Radiculopathy, lumbar region: Secondary | ICD-10-CM | POA: Diagnosis not present

## 2014-10-06 DIAGNOSIS — M5030 Other cervical disc degeneration, unspecified cervical region: Secondary | ICD-10-CM | POA: Diagnosis not present

## 2014-10-06 DIAGNOSIS — M9901 Segmental and somatic dysfunction of cervical region: Secondary | ICD-10-CM | POA: Diagnosis not present

## 2014-10-06 NOTE — Telephone Encounter (Signed)
TC back to pt she has an appt w/ Dr. Irving Shows but not till 7/21 what else can she do till then? Suggested using an exercise band in the mornings and stretching her feet before getting up and some good shoe inserts.

## 2014-10-09 DIAGNOSIS — N3010 Interstitial cystitis (chronic) without hematuria: Secondary | ICD-10-CM | POA: Diagnosis not present

## 2014-10-13 DIAGNOSIS — N3010 Interstitial cystitis (chronic) without hematuria: Secondary | ICD-10-CM | POA: Diagnosis not present

## 2014-10-16 DIAGNOSIS — M79672 Pain in left foot: Secondary | ICD-10-CM | POA: Diagnosis not present

## 2014-10-16 DIAGNOSIS — M7742 Metatarsalgia, left foot: Secondary | ICD-10-CM | POA: Diagnosis not present

## 2014-11-03 DIAGNOSIS — N3010 Interstitial cystitis (chronic) without hematuria: Secondary | ICD-10-CM | POA: Diagnosis not present

## 2014-11-10 ENCOUNTER — Encounter: Payer: Self-pay | Admitting: Family Medicine

## 2014-11-10 ENCOUNTER — Ambulatory Visit (INDEPENDENT_AMBULATORY_CARE_PROVIDER_SITE_OTHER): Payer: Medicare Other | Admitting: Family Medicine

## 2014-11-10 VITALS — BP 95/68 | HR 85 | Temp 97.9°F | Ht 67.5 in | Wt 116.8 lb

## 2014-11-10 DIAGNOSIS — N3010 Interstitial cystitis (chronic) without hematuria: Secondary | ICD-10-CM | POA: Diagnosis not present

## 2014-11-10 DIAGNOSIS — M1990 Unspecified osteoarthritis, unspecified site: Secondary | ICD-10-CM

## 2014-11-10 DIAGNOSIS — M26629 Arthralgia of temporomandibular joint, unspecified side: Secondary | ICD-10-CM

## 2014-11-10 DIAGNOSIS — M2662 Arthralgia of temporomandibular joint: Secondary | ICD-10-CM | POA: Diagnosis not present

## 2014-11-10 DIAGNOSIS — R8290 Unspecified abnormal findings in urine: Secondary | ICD-10-CM | POA: Diagnosis not present

## 2014-11-10 MED ORDER — TRAMADOL HCL 50 MG PO TABS: 50.0000 mg | ORAL_TABLET | Freq: Four times a day (QID) | ORAL | Status: DC | PRN

## 2014-11-10 MED ORDER — KETOROLAC TROMETHAMINE 60 MG/2ML IM SOLN
30.0000 mg | Freq: Once | INTRAMUSCULAR | Status: AC
  Administered 2014-11-10: 30 mg via INTRAMUSCULAR

## 2014-11-10 MED ORDER — KETOROLAC TROMETHAMINE 60 MG/2ML IM SOLN: 60.0000 mg | Freq: Once | INTRAMUSCULAR | Status: DC

## 2014-11-10 NOTE — Patient Instructions (Addendum)
Great to meet you!  Continue tramadol, be careful with the excedrin to not exceed 3 grams of tylenol daily  I have written your referral, you should hear back in a week or so  You may also Baclofen, 5 mg 3 times daily.   Please come back to see Dr. Livia Snellen or myself as needed.

## 2014-11-10 NOTE — Assessment & Plan Note (Addendum)
Unclear etiology right face/jaw pain. She has seen a periodontist who recommended seeing oromaxillofacial surgery at [facility] Referral was written today Difficulty with treatment given that she has some symptoms of neuropathic pain, muscle spasm, and TMJ concerns. She is allergic or had adverse effects to Lyrica and gabapentin which is limiting treating the neuropathic pain She has too much risk for side effect to give her amitriptyline, I discussed Cymbalta with her that she does not really want to try that. Given tramadol today, continue Tylenol and Excedrin with careful limits on Tylenol dose during the day. Also given Toradol injection today.

## 2014-11-10 NOTE — Progress Notes (Signed)
Patient ID: Kristy Garza, female   DOB: 08/28/1950, 64 y.o.   MRN: [id number]   HPI  Patient presents today for acute visit to address facial pain  Patient explains that she has right facial pain, teeth pain, tongue pain, muscle spasm of the right side of the face, and intermittent right ear pain. She describes her tongue pain as burning type pain. She has intermittent sharp pains in her right ear. She has seen several physicians for this problem and states that it has been going on for about one year. She has been to 2 pain clinics, periodontitis, and an ENT. She was told that it was TMJ, her periodontist recommended that she see oromaxillofacial surgery at [facility].  She states that she had adverse effects to Lyrica and gabapentin. Gabapentin did not seem to help. She's had good benefit from Toradol in the past but is very nervous about taking the medication. She has been taking a 1/4 to 1/57f tramadol as needed Flexeril made her drowsy She has baclofen which she wants to try after discussion that was given for interstitial cystitis  No fever, chills, sweats No dyspnea No chest pain  PMH: Smoking status noted ROS: Per HPI, otherwise negative  Objective: BP 95/68 mmHg  Pulse 85  Temp(Src) 97.9 F (36.6 C) (Oral)  Ht 5' 7.5" (1.715 m)  Wt 116 lb 12.8 oz (52.98 kg)  BMI 18.01 kg/m2 Gen: NAD, alert, cooperative with exam HEENT: NCAT, while tenderness to palpation of the right TMJ, TMs WNL BL CV: RRR, good S1/S2, no murmur Resp: CTABL, no wheezes, non-labored Ext: No edema, warm Neuro: Alert and oriented, No gross deficits  Assessment and plan:  TMJ arthralgia Unclear etiology right face/jaw pain. She has seen a periodontist who recommended seeing oromaxillofacial surgery at [facility] Referral was written today Difficulty with treatment given that she has some symptoms of neuropathic pain, muscle spasm, and TMJ concerns. She is allergic or had adverse effects to  Lyrica and gabapentin which is limiting treating the neuropathic pain She has too much risk for side effect to give her amitriptyline, I discussed Cymbalta with her that she does not really want to try that. Given tramadol today, continue Tylenol and Excedrin with careful limits on Tylenol dose during the day. Also given Toradol injection today.   Orders Placed This Encounter  Procedures  . Ambulatory referral to Oral Maxillofacial Surgery    Referral Priority:  Routine    Referral Type:  Surgical    Referral Reason:  Specialty Services Required    Requested Specialty:  Oral Surgery    Number of Visits Requested:  1    Meds ordered this encounter  Medications  . traMADol (ULTRAM) 50 MG tablet    Sig: Take 1 tablet (50 mg total) by mouth every 6 (six) hours as needed.    Dispense:  30 tablet    Refill:  0  . DISCONTD: ketorolac (TORADOL) injection 60 mg    Sig:   . ketorolac (TORADOL) injection 30 mg    Sig:    Laroy Apple, MD [facility] 11/10/2014, 5:44 PM

## 2014-11-12 ENCOUNTER — Telehealth: Payer: Self-pay | Admitting: Family Medicine

## 2014-11-12 NOTE — Telephone Encounter (Signed)
Patient called and stated that jaw pain is not any better.  Patient states she is taking the tramadol 1/2 tablet in the am and the pm. Informed patient she can take a whole tablet every 6 hours as needed and if not better to give [name] a call back.  Patient verbalized understanding.

## 2014-11-13 DIAGNOSIS — N3010 Interstitial cystitis (chronic) without hematuria: Secondary | ICD-10-CM | POA: Diagnosis not present

## 2014-11-14 ENCOUNTER — Ambulatory Visit: Payer: Medicare Other | Admitting: Family Medicine

## 2014-11-14 ENCOUNTER — Ambulatory Visit: Payer: Medicare Other

## 2014-11-15 ENCOUNTER — Ambulatory Visit (INDEPENDENT_AMBULATORY_CARE_PROVIDER_SITE_OTHER): Payer: Medicare Other | Admitting: Physician Assistant

## 2014-11-15 ENCOUNTER — Ambulatory Visit: Payer: Medicare Other

## 2014-11-15 ENCOUNTER — Encounter: Payer: Self-pay | Admitting: Physician Assistant

## 2014-11-15 VITALS — BP 90/57 | HR 87 | Temp 97.0°F | Wt 111.8 lb

## 2014-11-15 DIAGNOSIS — R6884 Jaw pain: Secondary | ICD-10-CM | POA: Diagnosis not present

## 2014-11-15 DIAGNOSIS — G8929 Other chronic pain: Secondary | ICD-10-CM

## 2014-11-15 MED ORDER — KETOROLAC TROMETHAMINE 30 MG/ML IJ SOLN
30.0000 mg | Freq: Once | INTRAMUSCULAR | Status: AC
  Administered 2014-11-15: 30 mg via INTRAMUSCULAR

## 2014-11-15 MED ORDER — PREDNISONE 10 MG (21) PO TBPK: ORAL_TABLET | ORAL | Status: DC

## 2014-11-15 NOTE — Progress Notes (Signed)
   Subjective:    Patient ID: Kristy Garza, female    DOB: 09-25-1950, 64 y.o.   MRN: [id number]  HPI 64 y/o female presents for c/o pressure in ears, teeth, gums. She was diagnosed with TMJ problems by her peridontist. She saw Dr. Wendi Snipes 5 days ago in office and referral to oral surgeon was ordered. She states that she was given a Toradol injection which relieved the pain. She is requesting one today until she can be seen by her oral surgeon.   She states that she was also having urinary symptoms and was prescribed an antibiotic by her Urologist this morning.     Review of Systems  Constitutional: Negative.   HENT: Positive for dental problem, ear pain (bilateral ) and sinus pressure.   Neurological: Positive for headaches.       Objective:   Physical Exam  Constitutional: She is oriented to person, place, and time. She appears well-developed and well-nourished. No distress.  HENT:  Head: Normocephalic and atraumatic.  Right Ear: External ear normal.  Left Ear: External ear normal.  Nose: Nose normal.  Mouth/Throat: Oropharynx is clear and moist. No oropharyngeal exudate.  TTP over bilateral jawline   Neurological: She is alert and oriented to person, place, and time.  Skin: She is not diaphoretic.  Psychiatric: She has a normal mood and affect. Her behavior is normal. Judgment and thought content normal.  Nursing note and vitals reviewed.         Assessment & Plan:  1. Chronic jaw pain  - predniSONE (STERAPRED UNI-PAK 21 TAB) 10 MG (21) TBPK tablet; 6 pills PO on day 1, 5 on day 2, 4 on day 3, 3 on day 4, 2 on day 5, 1 on day 6  Dispense: 21 tablet; Refill: 0 - ketorolac (TORADOL) 30 MG/ML injection 30 mg; Inject 1 mL (30 mg total) into the muscle once.  - Keep appointment with oral surgeon   Marq Rebello A. Benjamin Stain PA-C

## 2014-11-16 ENCOUNTER — Other Ambulatory Visit: Payer: Self-pay | Admitting: Physician Assistant

## 2014-11-16 MED ORDER — FLUTICASONE PROPIONATE 50 MCG/ACT NA SUSP: 2.0000 | Freq: Every day | NASAL | Status: DC

## 2014-11-16 MED ORDER — AZELASTINE HCL 0.1 % NA SOLN: 2.0000 | Freq: Two times a day (BID) | NASAL | Status: DC

## 2014-11-19 ENCOUNTER — Telehealth: Payer: Self-pay

## 2014-11-19 DIAGNOSIS — J329 Chronic sinusitis, unspecified: Secondary | ICD-10-CM | POA: Diagnosis not present

## 2014-11-19 DIAGNOSIS — R51 Headache: Secondary | ICD-10-CM | POA: Diagnosis not present

## 2014-11-19 NOTE — Telephone Encounter (Signed)
[facility] to check on referral status; Pt waiting to be contacted by [facility] for an appointment

## 2014-11-20 ENCOUNTER — Telehealth: Payer: Self-pay | Admitting: Physician Assistant

## 2014-11-20 NOTE — Telephone Encounter (Signed)
[facility] as per DPR with information

## 2014-11-21 NOTE — Telephone Encounter (Signed)
[facility] to x-ray 

## 2014-11-22 ENCOUNTER — Telehealth: Payer: Self-pay | Admitting: Physician Assistant

## 2014-11-22 NOTE — Telephone Encounter (Signed)
Spoke with patient. She was wanting know if she can take her Neurontin 100mg  for her face pain. Advised that this would be ok. If the pain is from TMJ it may not help but it won't hurt either since Neurontin is for nerve pain. She is going to continue taking 1/2 tablet of Tramadol 50mg  BID and may add ibuprofen 200mg  a few times a day. She has an appointment with a TMJ specialist on Monday 11/24/14 and will follow up here as needed. She is going out of town next week and wanted to know if a Toradol injection prior to going would be helpful. Explained that Toradol isn't a long acting medication and that it would wear off within approx 6-8 hours.  She stated understanding and agreement to plan.

## 2014-11-25 ENCOUNTER — Telehealth: Payer: Self-pay | Admitting: Physician Assistant

## 2014-11-26 ENCOUNTER — Ambulatory Visit (INDEPENDENT_AMBULATORY_CARE_PROVIDER_SITE_OTHER): Payer: Medicare Other | Admitting: Pediatrics

## 2014-11-26 ENCOUNTER — Encounter: Payer: Self-pay | Admitting: Pediatrics

## 2014-11-26 VITALS — BP 119/70 | HR 73 | Temp 97.8°F | Ht 67.5 in | Wt 107.2 lb

## 2014-11-26 DIAGNOSIS — J302 Other seasonal allergic rhinitis: Secondary | ICD-10-CM

## 2014-11-26 DIAGNOSIS — R634 Abnormal weight loss: Secondary | ICD-10-CM

## 2014-11-26 DIAGNOSIS — R399 Unspecified symptoms and signs involving the genitourinary system: Secondary | ICD-10-CM | POA: Diagnosis not present

## 2014-11-26 LAB — POCT URINALYSIS DIPSTICK
BILIRUBIN UA: NEGATIVE
GLUCOSE UA: NEGATIVE
KETONES UA: NEGATIVE
Leukocytes, UA: NEGATIVE
NITRITE UA: NEGATIVE
Protein, UA: NEGATIVE
RBC UA: NEGATIVE
Spec Grav, UA: <=1.005
Urobilinogen, UA: NEGATIVE
pH, UA: 8

## 2014-11-26 LAB — POCT UA - MICROSCOPIC ONLY
BACTERIA, U MICROSCOPIC: NEGATIVE
CASTS, UR, LPF, POC: NEGATIVE
CRYSTALS, UR, HPF, POC: NEGATIVE
EPITHELIAL CELLS, URINE PER MICROSCOPY: NEGATIVE
MUCUS UA: NEGATIVE
RBC, urine, microscopic: NEGATIVE
WBC, Ur, HPF, POC: NEGATIVE
YEAST UA: NEGATIVE

## 2014-11-26 NOTE — Progress Notes (Signed)
Subjective:    Patient ID: Kristy Garza, female    DOB: [birth year], 64 y.o.   MRN: [id number]  HPI: Kristy Garza is a 64 y.o. female presenting on 11/26/2014 for Uti symptoms She was treated for a UTI about a week ago, still has some burning. Recently saw her acupuncturist who said she has an inflamed liver. Several other complaints including facial pain which per chart review has been ongoing and she has an appointment in [facility] with Dr. Ashley [name] Sept 12 for evaluation of possible TMJ. She has seen 3 other providers in our clinic over the past 2 weeks for similar complaints.  She says she has not been eating normally over the last few days because eating causes so much pain in her teeth. She has lost 10 lbs in 2 weeks.   Facial pain and teeth pain for over a year. Had a URI that lingered and has had pain since then. Has been told that she had trigeminal neuralgia, wasn't able to take carbamazepine. Saw a different neurologist, now pain thought to be TMJ.  Has had two sinus surgeries., in 2012 and 2014.   Had a colonoscopy 5 yrs ago that was normal. Mammogram scheduled for November.  Recently finished prednisone for inflammation in her teeth.   Fam hx: dad had melanoma  No smoking history.    Relevant past medical, surgical, family and social history reviewed and updated as indicated. Interim medical history since our last visit reviewed. Allergies and medications reviewed and updated.  Review of Systems  Constitutional: Positive for weight loss and malaise/fatigue. Negative for fever and chills.  HENT: Negative for congestion.   Eyes: Negative for blurred vision and double vision.  Cardiovascular: Negative for chest pain.  Gastrointestinal: Negative for abdominal pain and constipation.  Genitourinary: Positive for dysuria.  Musculoskeletal: Negative for falls.  Skin: Negative for rash.  Neurological: Negative for dizziness, tingling, sensory change, focal weakness  and headaches.  Psychiatric/Behavioral: Negative for depression and substance abuse. The patient is not nervous/anxious.     ROS: Per HPI unless specifically indicated above      Objective:    BP 119/70 mmHg  Pulse 73  Temp(Src) 97.8 F (36.6 C) (Oral)  Ht 5' 7.5" (1.715 m)  Wt 107 lb 3.2 oz (48.626 kg)  BMI 16.53 kg/m2  Wt Readings from Last 3 Encounters:  11/26/14 107 lb 3.2 oz (48.626 kg)  11/15/14 111 lb 12.8 oz (50.712 kg)  11/10/14 116 lb 12.8 oz (52.98 kg)     Gen: thin, frail elderly woman in NAD, alert, cooperative with exam, NCAT EYES: EOMI, no scleral injection or icterus ENT:  TMs pearly gray b/l, OP without erythema LYMPH: no cervical LAD CV: NRRR, normal S1/S2, no murmur, DP pulses 2+ b/l Resp: CTABL, no wheezes, normal WOB Abd: +BS, soft, scaphoid, NTND. no guarding or organomegaly Ext: No edema, warm Neuro: Alert and oriented, strength equal b/l UE and LE, coordination grossly normal MSK: normal muscle bulk     Assessment & Plan:   No problem-specific assessment & plan notes found for this encounter.   Kristy Garza was seen today for uti symptoms, also with 10 lbs of weight loss over the last 2 weeks that she seemed surprised by. Wt loss most likely from pain in teeth decreasing PO intake. Rec starting Boost 2-3 times a day with meals. Will return within next 1-2 weeks for weight recheck. Given sudden severe weight loss also sent labs. Has been evaluated by  neurology, peridontologist, dentist for tooth and face pain. Has an upcoming appt with new referral in 2 weeks.  Diagnoses and all orders for this visit:  UTI symptoms Negative UA. Symptoms likely to resolve over next few days.   Loss of weight See above. -     CBC -     CMP14+EGFR -     Thyroid Panel With TSH  Seasonal allergic rhinitis Continue allegra, flonase. Consistently clearing throat throughout visit. Is followed for chronic bronchitis vs asthma by pulm and also has an allergist.   Follow  up plan: Return in about 1-2 weeks (around 12/10/2014).  Assunta Found, MD [facility] 11/26/2014, 8:16 PM

## 2014-11-26 NOTE — Patient Instructions (Addendum)
Drink two to three boosts a day.  Keep a diet of the food you take in.  Come back in 2 weeks for visit weight recheck.  Stay under 3 grams a day of tylenol/acetaminophen.

## 2014-11-27 LAB — CMP14+EGFR
A/G RATIO: 2 (ref 1.1–2.5)
ALK PHOS: 67 IU/L (ref 39–117)
ALT: 22 IU/L (ref 0–32)
AST: 23 IU/L (ref 0–40)
Albumin: 4.2 g/dL (ref 3.6–4.8)
BILIRUBIN TOTAL: 0.3 mg/dL (ref 0.0–1.2)
BUN / CREAT RATIO: 16 (ref 11–26)
BUN: 10 mg/dL (ref 8–27)
CHLORIDE: 99 mmol/L (ref 97–108)
CO2: 27 mmol/L (ref 18–29)
Calcium: 9.2 mg/dL (ref 8.7–10.3)
Creatinine, Ser: 0.64 mg/dL (ref 0.57–1.00)
GFR calc Af Amer: 109 mL/min/{1.73_m2} (ref >59)
GFR calc non Af Amer: 95 mL/min/{1.73_m2} (ref >59)
GLUCOSE: 91 mg/dL (ref 65–99)
Globulin, Total: 2.1 g/dL (ref 1.5–4.5)
POTASSIUM: 4.2 mmol/L (ref 3.5–5.2)
Sodium: 142 mmol/L (ref 134–144)
Total Protein: 6.3 g/dL (ref 6.0–8.5)

## 2014-11-27 LAB — CBC
Hematocrit: 33.5 % — ABNORMAL LOW (ref 34.0–46.6)
Hemoglobin: 11 g/dL — ABNORMAL LOW (ref 11.1–15.9)
MCH: 30.3 pg (ref 26.6–33.0)
MCHC: 32.8 g/dL (ref 31.5–35.7)
MCV: 92 fL (ref 79–97)
PLATELETS: 346 10*3/uL (ref 150–379)
RBC: 3.63 x10E6/uL — ABNORMAL LOW (ref 3.77–5.28)
RDW: 13.2 % (ref 12.3–15.4)
WBC: 4.7 10*3/uL (ref 3.4–10.8)

## 2014-11-27 LAB — THYROID PANEL WITH TSH
FREE THYROXINE INDEX: 2.6 (ref 1.2–4.9)
T3 UPTAKE RATIO: 38 % (ref 24–39)
T4, Total: 6.9 ug/dL (ref 4.5–12.0)
TSH: 0.329 u[IU]/mL — AB (ref 0.450–4.500)

## 2014-11-27 NOTE — Telephone Encounter (Signed)
Spoke to pt; She will call us after her appointment this week

## 2014-11-28 NOTE — Progress Notes (Signed)
Patient aware.

## 2014-12-08 DIAGNOSIS — M2662 Arthralgia of temporomandibular joint: Secondary | ICD-10-CM | POA: Diagnosis not present

## 2014-12-08 DIAGNOSIS — M791 Myalgia: Secondary | ICD-10-CM | POA: Diagnosis not present

## 2014-12-08 DIAGNOSIS — M26212 Malocclusion, Angle's class II: Secondary | ICD-10-CM | POA: Diagnosis not present

## 2014-12-08 DIAGNOSIS — G4763 Sleep related bruxism: Secondary | ICD-10-CM | POA: Diagnosis not present

## 2014-12-10 ENCOUNTER — Ambulatory Visit: Payer: Medicare Other | Admitting: Pediatrics

## 2014-12-12 ENCOUNTER — Encounter: Payer: Self-pay | Admitting: Physician Assistant

## 2014-12-18 ENCOUNTER — Ambulatory Visit: Payer: Medicare Other | Admitting: Pediatrics

## 2014-12-18 ENCOUNTER — Ambulatory Visit (INDEPENDENT_AMBULATORY_CARE_PROVIDER_SITE_OTHER): Payer: Medicare Other | Admitting: Pediatrics

## 2014-12-18 ENCOUNTER — Encounter: Payer: Self-pay | Admitting: Pediatrics

## 2014-12-18 VITALS — BP 101/66 | HR 74 | Temp 97.3°F | Ht 67.5 in | Wt 110.4 lb

## 2014-12-18 DIAGNOSIS — M2662 Arthralgia of temporomandibular joint: Secondary | ICD-10-CM

## 2014-12-18 DIAGNOSIS — M26629 Arthralgia of temporomandibular joint, unspecified side: Secondary | ICD-10-CM

## 2014-12-18 DIAGNOSIS — R634 Abnormal weight loss: Secondary | ICD-10-CM

## 2014-12-18 NOTE — Assessment & Plan Note (Addendum)
Stable, weight up 4 lbs from low of 107 lbs.

## 2014-12-18 NOTE — Patient Instructions (Addendum)
--   Frozen fruits, yogurt with orange juice or milk for smoothies  -- Keep taking ensure several times a day as needed  -- Avoid bagels, sandwiches, gummy vitamins, chewy gummy candies

## 2014-12-18 NOTE — Progress Notes (Signed)
Subjective:    Patient ID: Kristy Garza, female    DOB: December 27, 1950, 64 y.o.   MRN: [id number]  HPI: Kristy Garza is a 64 y.o. female presenting on 12/18/2014 for 2 week follow up  Has seen an oral surgeon told she didn't have TMJ. Then seen by pain clinic [facility] [facility] recommended trigger point injections, PT.  Physical therapy yesterday helped some. Tramadol is helping some with pain. Also going to chiropractor and accupuncturist.  Relevant past medical, surgical, family and social history reviewed and updated as indicated. Interim medical history since our last visit reviewed. Allergies and medications reviewed and updated.   ROS: Per HPI unless specifically indicated above  Past Medical History Patient Active Problem List   Diagnosis Date Noted  . Weight loss 12/18/2014  . 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 Trigeminal neuralgia 04/17/2014  . 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 Asthma, chronic 05/13/2013  . Diarrhea 05/13/2013  . Anxiety state, unspecified 05/13/2013  . Chronic rhinitis 05/13/2013  . Thrush 04/21/2013  . Hemoptysis, unspecified 03/23/2013  . Bronchitis, chronic obstructive w acute bronchitis 03/23/2013  . Leg cramps 03/23/2013  . Osteoporosis with pathological fracture 01/30/2013  . Pain in joint, ankle and foot 08/14/2012  . Toe contusion 08/14/2012  . Seasonal allergic rhinitis 07/07/2012  . Sinusitis nasal 07/07/2012  . G E R D 08/15/2007  . Cough 08/15/2007  . VOCAL CORD POLYP, HX OF 08/15/2007    Current Outpatient Prescriptions  Medication Sig Dispense Refill  . acetaminophen (TYLENOL) 500 MG tablet Take 500 mg by mouth every 6 (six) hours as needed.    [name] albuterol (PROVENTIL HFA;VENTOLIN HFA) 108 (90 BASE) MCG/ACT inhaler Inhale 2 puffs into the lungs every 4 (four) hours as needed for wheezing or shortness of breath. 1 Inhaler prn  . albuterol (PROVENTIL) (2.5 MG/3ML) 0.083% nebulizer solution Take 3 mLs  (2.5 mg total) by nebulization every 6 (six) hours as needed for wheezing or shortness of breath. 150 mL 1  . aspirin-acetaminophen-caffeine (EXCEDRIN MIGRAINE) 086-578-46 MG per tablet Take 1 tablet by mouth every 6 (six) hours as needed for headache.    [name] azelastine (ASTELIN) 0.1 % nasal spray Place 2 sprays into both nostrils 2 (two) times daily. Use in each nostril as directed 30 mL 12  . baclofen (LIORESAL) 10 MG tablet Take 5 mg by mouth 2 (two) times daily.    . benzonatate (TESSALON) 200 MG capsule Take 200 mg by mouth 3 (three) times daily.    . calcium citrate-vitamin D (CITRACAL+D) 315-200 MG-UNIT per tablet Take 1 tablet by mouth.    . Calcium Glycerophosphate (PRELIEF PO) Take 1 tablet by mouth as needed (prior to acid containing foods).    . Cholecalciferol (VITAMIN D PO) Take 4,000 Units by mouth daily.     . diclofenac sodium (VOLTAREN) 1 % GEL Apply 1 g topically.     [name] ELMIRON 100 MG capsule Take 100 mg by mouth 2 (two) times daily.     . fexofenadine (ALLEGRA) 60 MG tablet Take 60 mg by mouth 2 (two) times daily.    . fluticasone (FLONASE) 50 MCG/ACT nasal spray Place 2 sprays into both nostrils daily. 16 g 11  . guaiFENesin (MUCINEX) 600 MG 12 hr tablet Take 600 mg by mouth 2 (two) times daily.    [name] lidocaine (XYLOCAINE) 2 % jelly Apply topically as needed.    . Linaclotide (LINZESS) 290 MCG CAPS capsule TAKE 1 CAPSULE (  290 MCG TOTAL) BY MOUTH DAILY. 30 capsule 5  . Magnesium Oxide 400 (240 MG) MG TABS Take 1 tablet by mouth daily.    . Multiple Vitamin (MULTIVITAMIN) tablet Take 1 tablet by mouth daily.    . Nutritional Supplements (DHEA PO) Take 5 mg by mouth daily.     . Omega-3 Fatty Acids (FISH OIL) 1000 MG CAPS Take 1 capsule by mouth 2 (two) times daily.     . pantoprazole (PROTONIX) 40 MG tablet Take 40 mg by mouth daily.    . traMADol (ULTRAM) 50 MG tablet Take 1 tablet (50 mg total) by mouth every 6 (six) hours as needed. 30 tablet 0   Current Facility-Administered  Medications  Medication Dose Route Frequency Provider Last Rate Last Dose  . cyanocobalamin ((VITAMIN B-12)) injection 1,000 mcg  1,000 mcg Intramuscular Q30 days Kristy Fraise, MD   1,000 mcg at 06/16/14 1324   Facility-Administered Medications Ordered in Other Visits  Medication Dose Route Frequency Provider Last Rate Last Dose  . bupivacaine (MARCAINE) 0.5 % 10 mL, triamcinolone acetonide (KENALOG-40) 40 mg injection   Subcutaneous Once Kristy Clines, MD      . bupivacaine (MARCAINE) 0.5 % 15 mL, phenazopyridine (PYRIDIUM) 400 mg bladder mixture   Bladder Instillation Once Kristy Clines, MD           Objective:    BP 101/66 mmHg  Pulse 74  Temp(Src) 97.3 F (36.3 C) (Oral)  Ht 5' 7.5" (1.715 m)  Wt 110 lb 6.4 oz (50.077 kg)  BMI 17.03 kg/m2  Wt Readings from Last 3 Encounters:  12/18/14 110 lb 6.4 oz (50.077 kg)  11/26/14 107 lb 3.2 oz (48.626 kg)  11/15/14 111 lb 12.8 oz (50.712 kg)    Gen: thin, NAD, alert, cooperative with exam EYES: EOMI, no scleral injection or icterus ENT:  TMs pearly gray b/l, OP without erythema LYMPH: no cervical LAD CV: NRRR, normal S1/S2, no murmur, DP pulses 2+ b/l Resp: CTABL, no wheezes, normal WOB Ext: No edema, warm Neuro: Alert and oriented     Assessment & Plan:   Kristy Garza was seen today for 2 week follow up of weight loss, trouble eating, facial and jaw pain.  Weight loss Stable, weight up 4 lbs from low of 107 lbs before. Offered referral to nutritionist, pt is going to think about it.  TMJ arthralgia Seeing multiple specialists, she has started physical therapy for her jaw, thinks it might be helping. She was offered pain management through the dental clinic, has also thought about going to facial pain specialist in [place]. She is concerned about the cost of additional therapies if she were to continue at this point as most payments need to be out of pocket. As PT has been helping, she will continue current therapies,  baclofen 1/2 to whole tab 2-3 times a day as needed.   Follow up plan: Return in about 4 weeks (around 01/15/2015).  Kristy Found, MD [facility] 12/18/2014, 10:13 AM

## 2014-12-22 DIAGNOSIS — K5901 Slow transit constipation: Secondary | ICD-10-CM | POA: Diagnosis not present

## 2014-12-22 DIAGNOSIS — K5900 Constipation, unspecified: Secondary | ICD-10-CM | POA: Diagnosis not present

## 2014-12-22 DIAGNOSIS — K219 Gastro-esophageal reflux disease without esophagitis: Secondary | ICD-10-CM | POA: Diagnosis not present

## 2014-12-22 DIAGNOSIS — K589 Irritable bowel syndrome without diarrhea: Secondary | ICD-10-CM | POA: Diagnosis not present

## 2014-12-24 ENCOUNTER — Telehealth: Payer: Self-pay | Admitting: Physician Assistant

## 2014-12-25 ENCOUNTER — Telehealth: Payer: Self-pay | Admitting: Pediatrics

## 2014-12-25 NOTE — Telephone Encounter (Signed)
Appointment scheduled for 12-26-14 with Dr. Wendi Snipes.

## 2014-12-26 ENCOUNTER — Ambulatory Visit (INDEPENDENT_AMBULATORY_CARE_PROVIDER_SITE_OTHER): Payer: Medicare Other | Admitting: Family Medicine

## 2014-12-26 ENCOUNTER — Encounter: Payer: Self-pay | Admitting: Family Medicine

## 2014-12-26 VITALS — BP 101/66 | HR 73 | Temp 97.5°F | Ht 67.5 in | Wt 109.4 lb

## 2014-12-26 DIAGNOSIS — M2662 Arthralgia of temporomandibular joint: Secondary | ICD-10-CM

## 2014-12-26 DIAGNOSIS — G500 Trigeminal neuralgia: Secondary | ICD-10-CM

## 2014-12-26 DIAGNOSIS — M26629 Arthralgia of temporomandibular joint, unspecified side: Secondary | ICD-10-CM

## 2014-12-26 DIAGNOSIS — M542 Cervicalgia: Secondary | ICD-10-CM | POA: Insufficient documentation

## 2014-12-26 DIAGNOSIS — Z23 Encounter for immunization: Secondary | ICD-10-CM | POA: Diagnosis not present

## 2014-12-26 NOTE — Progress Notes (Signed)
   HPI  Patient presents today for follow-up trigeminal neuralgia, TMJ, and back pain  Patient was recently seen at [facility] and told that she did not have TMJ. She was then seen at the pain clinic 3 days later, at [facility] also, and told that she has trigeminal neuralgia as well as TMJ. They offered a CT scan for further evaluation as well as oral injections. However her insurance does not cover the cost of the visit at that clinic as well as their therapies so she cannot afford these.  She comes in today asking for an occupational therapy referral to [facility]. She states that they can treat GERD, neck pain, and TMJ.  She states that she's had chronic back pain which is diffuse posterior lower cervical neck pain, described a dull nonradiating pain. There are no aggravating or alleviating factors.  Shoulder pain She's had 2 days of left-sided sharp shoulder pain and her posterior shoulder after reaching to wash her underarms.She has had continued pain since that time but it is not limiting her activities.  She   PMH: Smoking status noted ROS: Per HPI  Objective: BP 101/66 mmHg  Pulse 73  Temp(Src) 97.5 F (36.4 C) (Oral)  Ht 5' 7.5" (1.715 m)  Wt 109 lb 6.4 oz (49.624 kg)  BMI 16.87 kg/m2 Gen: NAD, alert, cooperative with exam HEENT: NCAT Ext: No edema, warm Neuro: Alert and oriented, Strength 5/5 in BL UE MSK: no cervical bony tenderness or paraspinal tenderness, Full cervical ROM Shoulder- Full ROM including apley's scratch test, negative hawkins and empty can test  Assessment and plan:  # Chronic pain syndrome Has tried and failed gabapentin and ;yrica, would like to pursue OT for now.   # neck pain No red flags, MSK and neuro exam reassuring, no radiculopathy OT per her request Consider TCA- nortriptyline   # Trigeminal neuralgia, TMJ Follow up with [facility] and [facility] pain clinic as possible, however she cannot afford this Avoid narcotics, appears to be  chronic pain.  OT as above, [facility] per her request.   Orders Placed This Encounter  Procedures  . Ambulatory referral to Occupational Therapy    Referral Priority:  Routine    Referral Type:  Occupational Therapy    Referral Reason:  Specialty Services Required    Requested Specialty:  Occupational Therapy    Number of Visits Requested:  [place], MD [facility] 12/26/2014, 3:27 PM

## 2014-12-26 NOTE — Patient Instructions (Signed)
Great to meet you!  We will work on the referral, I would also advise calling them in a week or so to see if everything is on track.   For your shoulder pain, I would recommend ice or heat- whichever feels better. Also be sure to keep it moving.

## 2015-01-13 ENCOUNTER — Ambulatory Visit: Payer: Self-pay | Admitting: Neurology

## 2015-01-15 ENCOUNTER — Ambulatory Visit: Payer: Medicare Other | Admitting: Pediatrics

## 2015-01-16 ENCOUNTER — Telehealth: Payer: Self-pay | Admitting: Family Medicine

## 2015-01-16 DIAGNOSIS — M542 Cervicalgia: Secondary | ICD-10-CM

## 2015-01-19 ENCOUNTER — Telehealth: Payer: Self-pay | Admitting: Physician Assistant

## 2015-01-19 ENCOUNTER — Ambulatory Visit: Payer: Medicare Other | Admitting: Pediatrics

## 2015-01-19 NOTE — Telephone Encounter (Signed)
I believe that is fine, it says in Dr. Alen Bleacher note that he would do it per her request. If Dr. Alen Bleacher in today I would go ahead and send a request under his name because he was the one that evaluated her, if not then I can sign it. Caryl Pina, MD [facility] 01/19/2015, 7:49 AM

## 2015-01-19 NOTE — Telephone Encounter (Signed)
Patient aware that written referral for PT is here at the office for her to pick up

## 2015-02-02 ENCOUNTER — Ambulatory Visit: Payer: Self-pay | Admitting: Neurology

## 2015-02-02 ENCOUNTER — Encounter: Payer: Self-pay | Admitting: Family Medicine

## 2015-02-02 ENCOUNTER — Ambulatory Visit (INDEPENDENT_AMBULATORY_CARE_PROVIDER_SITE_OTHER): Payer: Medicare Other | Admitting: Family Medicine

## 2015-02-02 VITALS — BP 97/69 | HR 72 | Temp 97.1°F | Ht 67.5 in | Wt 109.4 lb

## 2015-02-02 DIAGNOSIS — K589 Irritable bowel syndrome without diarrhea: Secondary | ICD-10-CM

## 2015-02-02 DIAGNOSIS — R300 Dysuria: Secondary | ICD-10-CM

## 2015-02-02 DIAGNOSIS — L209 Atopic dermatitis, unspecified: Secondary | ICD-10-CM

## 2015-02-02 LAB — POCT UA - MICROSCOPIC ONLY
Casts, Ur, LPF, POC: NEGATIVE
Crystals, Ur, HPF, POC: NEGATIVE
MUCUS UA: NEGATIVE
RBC, URINE, MICROSCOPIC: NEGATIVE
WBC, Ur, HPF, POC: NEGATIVE
Yeast, UA: NEGATIVE

## 2015-02-02 LAB — POCT URINALYSIS DIPSTICK
BILIRUBIN UA: NEGATIVE
Blood, UA: NEGATIVE
GLUCOSE UA: NEGATIVE
KETONES UA: NEGATIVE
LEUKOCYTES UA: NEGATIVE
Nitrite, UA: NEGATIVE
Protein, UA: NEGATIVE
Spec Grav, UA: <=1.005
Urobilinogen, UA: NEGATIVE
pH, UA: 5

## 2015-02-02 NOTE — Progress Notes (Signed)
BP 97/69 mmHg  Pulse 72  Temp(Src) 97.1 F (36.2 C) (Oral)  Ht 5' 7.5" (1.715 m)  Wt 109 lb 6.4 oz (49.624 kg)  BMI 16.87 kg/m2   Subjective:    Patient ID: Kristy Garza, female    DOB: Mar 23, 1951, 64 y.o.   MRN: [id number]  HPI: Kristy Garza is a 64 y.o. female presenting on 02/02/2015 for Bloated and Rash   HPI Dysuria  She has been having dysuria without hematuria or increased frequency or abdominal pain. She does describe abdominal bloating. She denies fevers or chills.  Bloated Patient has been describing abdominal distention or bloating. She denies any nausea or vomiting but does complain of intermittent constipation causing discomfort. This is consistent with her recurrent IBS. She tried Gas-X and Mylanta and did not seem to get improvement. She denies any blood in her stool and she denies any diarrhea.  Eczema Patient has a small patch of dry. Neck scaly skin in the gluteal cleft on her buttock. Significant for a couple weeks and she has tried lotions but does not have any steroid creams.  Relevant past medical, surgical, family and social history reviewed and updated as indicated. Interim medical history since our last visit reviewed. Allergies and medications reviewed and updated.  Review of Systems  Constitutional: Negative for fever and chills.  HENT: Negative for congestion, ear discharge and ear pain.   Eyes: Negative for redness and visual disturbance.  Respiratory: Negative for chest tightness and shortness of breath.   Cardiovascular: Negative for chest pain and leg swelling.  Gastrointestinal: Positive for abdominal distention. Negative for nausea, vomiting, abdominal pain, diarrhea, constipation and anal bleeding.  Genitourinary: Positive for dysuria. Negative for urgency, frequency, hematuria and difficulty urinating.  Musculoskeletal: Negative for back pain and gait problem.  Skin: Positive for rash.  Neurological: Negative for light-headedness and  headaches.  Psychiatric/Behavioral: Negative for behavioral problems and agitation.  All other systems reviewed and are negative.   Per HPI unless specifically indicated above     Medication List       This list is accurate as of: 02/02/15  4:14 PM.  Always use your most recent med list.               acetaminophen 500 MG tablet  Commonly known as:  TYLENOL  Take 500 mg by mouth every 6 (six) hours as needed.     albuterol (2.5 MG/3ML) 0.083% nebulizer solution  Commonly known as:  PROVENTIL  Take 3 mLs (2.5 mg total) by nebulization every 6 (six) hours as needed for wheezing or shortness of breath.     albuterol 108 (90 BASE) MCG/ACT inhaler  Commonly known as:  PROVENTIL HFA;VENTOLIN HFA  Inhale 2 puffs into the lungs every 4 (four) hours as needed for wheezing or shortness of breath.     aspirin-acetaminophen-caffeine 250-250-65 MG tablet  Commonly known as:  EXCEDRIN MIGRAINE  Take 1 tablet by mouth every 6 (six) hours as needed for headache.     azelastine 0.1 % nasal spray  Commonly known as:  ASTELIN  Place 2 sprays into both nostrils 2 (two) times daily. Use in each nostril as directed     baclofen 10 MG tablet  Commonly known as:  LIORESAL  Take 5 mg by mouth 2 (two) times daily.     calcium citrate-vitamin D 315-200 MG-UNIT tablet  Commonly known as:  CITRACAL+D  Take 1 tablet by mouth.     DHEA PO  Take  5 mg by mouth daily.     diclofenac sodium 1 % Gel  Commonly known as:  VOLTAREN  Apply 1 g topically.     ELMIRON 100 MG capsule  Generic drug:  pentosan polysulfate  Take 100 mg by mouth 2 (two) times daily.     fexofenadine 60 MG tablet  Commonly known as:  ALLEGRA  Take 60 mg by mouth 2 (two) times daily.     Fish Oil 1000 MG Caps  Take 1 capsule by mouth 2 (two) times daily.     fluticasone 50 MCG/ACT nasal spray  Commonly known as:  FLONASE  Place 2 sprays into both nostrils daily.     guaiFENesin 600 MG 12 hr tablet  Commonly known  as:  MUCINEX  Take 600 mg by mouth 2 (two) times daily.     lidocaine 2 % jelly  Commonly known as:  XYLOCAINE  Apply topically as needed.     Linaclotide 290 MCG Caps capsule  Commonly known as:  LINZESS  TAKE 1 CAPSULE (290 MCG TOTAL) BY MOUTH DAILY.     Magnesium Oxide 400 (240 MG) MG Tabs  Take 1 tablet by mouth daily.     multivitamin tablet  Take 1 tablet by mouth daily.     pantoprazole 40 MG tablet  Commonly known as:  PROTONIX  Take 40 mg by mouth daily.     PRELIEF PO  Take 1 tablet by mouth as needed (prior to acid containing foods).     traMADol 50 MG tablet  Commonly known as:  ULTRAM  Take 1 tablet (50 mg total) by mouth every 6 (six) hours as needed.     VITAMIN D PO  Take 4,000 Units by mouth daily.           Objective:    BP 97/69 mmHg  Pulse 72  Temp(Src) 97.1 F (36.2 C) (Oral)  Ht 5' 7.5" (1.715 m)  Wt 109 lb 6.4 oz (49.624 kg)  BMI 16.87 kg/m2  Wt Readings from Last 3 Encounters:  02/02/15 109 lb 6.4 oz (49.624 kg)  12/26/14 109 lb 6.4 oz (49.624 kg)  12/18/14 110 lb 6.4 oz (50.077 kg)    Physical Exam  Constitutional: She is oriented to person, place, and time. She appears well-developed and well-nourished. No distress.  Eyes: Conjunctivae and EOM are normal. Pupils are equal, round, and reactive to light.  Neck: Neck supple. No thyromegaly present.  Cardiovascular: Normal rate, regular rhythm, normal heart sounds and intact distal pulses.   No murmur heard. Pulmonary/Chest: Effort normal and breath sounds normal. No respiratory distress. She has no wheezes.  Abdominal: Soft. She exhibits no distension and no mass. There is tenderness (diffuse mild tenderness). There is no guarding.  Musculoskeletal: Normal range of motion. She exhibits no edema or tenderness.  Lymphadenopathy:    She has no cervical adenopathy.  Neurological: She is alert and oriented to person, place, and time. Coordination normal.  Skin: Skin is warm and dry. Rash  (small dry scaly patch in gluteal cleft. Consistent with atopic dermatitis.) noted. She is not diaphoretic.  Psychiatric: She has a normal mood and affect. Her behavior is normal.  Nursing note and vitals reviewed.   Results for orders placed or performed in visit on 11/26/14  CBC  Result Value Ref Range   WBC 4.7 3.4 - 10.8 x10E3/uL   RBC 3.63 (L) 3.77 - 5.28 x10E6/uL   Hemoglobin 11.0 (L) 11.1 - 15.9 g/dL   Hematocrit  33.5 (L) 34.0 - 46.6 %   MCV 92 79 - 97 fL   MCH 30.3 26.6 - 33.0 pg   MCHC 32.8 31.5 - 35.7 g/dL   RDW 13.2 12.3 - 15.4 %   Platelets 346 150 - 379 x10E3/uL  CMP14+EGFR  Result Value Ref Range   Glucose 91 65 - 99 mg/dL   BUN 10 8 - 27 mg/dL   Creatinine, Ser 0.64 0.57 - 1.00 mg/dL   GFR calc non Af Amer 95 >59 mL/min/1.73   GFR calc Af Amer 109 >59 mL/min/1.73   BUN/Creatinine Ratio 16 11 - 26   Sodium 142 134 - 144 mmol/L   Potassium 4.2 3.5 - 5.2 mmol/L   Chloride 99 97 - 108 mmol/L   CO2 27 18 - 29 mmol/L   Calcium 9.2 8.7 - 10.3 mg/dL   Total Protein 6.3 6.0 - 8.5 g/dL   Albumin 4.2 3.6 - 4.8 g/dL   Globulin, Total 2.1 1.5 - 4.5 g/dL   Albumin/Globulin Ratio 2.0 1.1 - 2.5   Bilirubin Total 0.3 0.0 - 1.2 mg/dL   Alkaline Phosphatase 67 39 - 117 IU/L   AST 23 0 - 40 IU/L   ALT 22 0 - 32 IU/L  Thyroid Panel With TSH  Result Value Ref Range   TSH 0.329 (L) 0.450 - 4.500 uIU/mL   T4, Total 6.9 4.5 - 12.0 ug/dL   T3 Uptake Ratio 38 24 - 39 %   Free Thyroxine Index 2.6 1.2 - 4.9  POCT UA - Microscopic Only  Result Value Ref Range   WBC, Ur, HPF, POC negative    RBC, urine, microscopic negative    Bacteria, U Microscopic negative    Mucus, UA negative    Epithelial cells, urine per micros negative    Crystals, Ur, HPF, POC negative    Casts, Ur, LPF, POC negative    Yeast, UA negative   POCT urinalysis dipstick  Result Value Ref Range   Color, UA gold    Clarity, UA clear    Glucose, UA negative    Bilirubin, UA negative    Ketones, UA  negative    Spec Grav, UA <=1.005    Blood, UA negative    pH, UA 8.0    Protein, UA negative    Urobilinogen, UA negative    Nitrite, UA negative    Leukocytes, UA Negative Negative      Assessment & Plan:   Problem List Items Addressed This Visit    None    Visit Diagnoses    Dysuria    -  Primary    check urine with culture, UA completely negative    Relevant Orders    POCT UA - Microscopic Only (Completed)    POCT urinalysis dipstick (Completed)    Urine culture (Completed)    Atopic dermatitis        Small spot and gluteal cleft, use hydrocortisone over-the-counter.    IBS (irritable bowel syndrome)        predominantly constipation, on Linzess,recommended to add MiraLAX and enema over-the-counter        Follow up plan: Return if symptoms worsen or fail to improve.  Caryl Pina, MD [facility] 02/02/2015, 4:14 PM

## 2015-02-04 LAB — URINE CULTURE

## 2015-02-05 ENCOUNTER — Ambulatory Visit: Payer: Medicare Other | Admitting: Physical Therapy

## 2015-02-24 ENCOUNTER — Ambulatory Visit: Payer: Medicare Other | Attending: Family Medicine

## 2015-02-24 DIAGNOSIS — G44221 Chronic tension-type headache, intractable: Secondary | ICD-10-CM

## 2015-02-24 DIAGNOSIS — M542 Cervicalgia: Secondary | ICD-10-CM

## 2015-02-24 DIAGNOSIS — M26623 Arthralgia of bilateral temporomandibular joint: Secondary | ICD-10-CM | POA: Diagnosis not present

## 2015-02-24 NOTE — Therapy (Signed)
[facility] [address], [place], [place], [address] Phone: [phone]   Fax:  [phone]  Physical Therapy Evaluation  Patient Details  Name: Kristy Garza MRN: [id number] Date of Birth: Aug 04, 1950 Referring Provider: Kenn File, MD  Encounter Date: 02/24/2015      PT End of Session - 02/24/15 1146    Visit Number 1   Number of Visits 10   Date for PT Re-Evaluation 04/21/15   PT Start Time 1100   PT Stop Time 1143   PT Time Calculation (min) 43 min   Activity Tolerance Patient tolerated treatment well   Behavior During Therapy [facility] for tasks assessed/performed      Past Medical History  Diagnosis Date  . Asthma   . Osteoporosis   . Acid reflux   . PONV (postoperative nausea and vomiting)   . Tinnitus   . Allergy   . Interstitial cystitis   . Trigeminal neuralgia     Atypical trigeminal neuralgia  . Arthritis     ARTHRITIS IN NECK BY DR. Michelle Nasuti    Past Surgical History  Procedure Laterality Date  . Abdominal hysterectomy  2000  . 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 Ethmoidectomy  2012  . Septoplasty  1980's  . Cystoscopy with hydrodistension and biopsy N/A 06/11/2012    Procedure: CYSTOSCOPY/BIOPSY/HYDRODISTENSION with Instillation of Pyridium and Marcaine and Kenalog;  Surgeon: Ailene Rud, MD;  Location[facility];  Service: Urology;  Laterality: N/A;  1 hour requested for this case  BLADDER BIOPSY    There were no vitals filed for this visit.  Visit Diagnosis:  Neck pain - Plan: PT plan of care cert/re-cert  Bilateral temporomandibular joint pain - Plan: PT plan of care cert/re-cert  Chronic tension-type headache, intractable - Plan: PT plan of care cert/re-cert      Subjective Assessment - 02/24/15 1104    Subjective Pt presents to PT with chronic history of neck pain/OA, trigeminal neuralgia on the Rt and TMJ bilaterally.  Pt  reports significant tension in bilateral temporalis and has frequent headaches. Pt is seeing a chiropractor 1x/wk for neck pain and TMJ and has received injections (glucose) by [facility] recently.   Pertinent History trigeminal neuralgia on the Rt-tried Gamma Knife to treat, injections (glucose) in to face at [facility] recently, pt wears a mouth guard at night   Diagnostic tests x-ray 2015: cervical DDD   Patient Stated Goals reduce headaches, reduce neck and jaw pain   Currently in Pain? 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  No   Is the patient reluctant to leave their home because of a fear of falling?  No   Home Ecologist residence   Prior Function   Level of Independence Independent   Vocation On disability  3 years    Cognition   Overall Cognitive Status Within Functional Limits for tasks assessed   Observation/Other Assessments   Focus on Therapeutic Outcomes (FOTO)  33% limitation   Posture/Postural Control   Posture/Postural Control Postural limitations    Postural Limitations Rounded Shoulders;Forward head   Posture Comments scapular elevation   ROM / Strength   AROM / PROM / Strength AROM;Strength   AROM   Overall AROM  Within functional limits for tasks performed   Overall AROM Comments cervical AROM is full with tension noted in bil. neck at end range of each motion.  TMJ: 5 cm opening with pain at end range.  No deviation of teeth with opening.     Strength   Overall Strength Within functional limits for tasks performed   Overall Strength Comments 4+/5 bilateral UE strength throughout   Palpation   Spinal mobility reduced spinal mobility in the cervical spine with pain at C3-4   Palpation comment Pt with clicking at TMJ with opening and closing Lt>Rt.  Pain with opening and closing.  Tension noted in bil. massester, bilateral suboccipitals and bilateral UT tension/pain                           PT Education - 02/24/15 1133    Education provided Yes   Education Details HEP: neck retraction, posture education, cervical AROM   Person(s) Educated Patient   Methods Explanation;Demonstration;Handout   Comprehension Verbalized understanding;Returned demonstration          PT Short Term Goals - 02/24/15 1134    PT SHORT TERM GOAL #1   Title be independent in initial HEP   Time 4   Period Weeks   Status New   PT SHORT TERM GOAL #2   Title report a 30% reduction in frequency and intenisty of headaches   Time 4   Period Weeks   Status New   PT SHORT TERM GOAL #3   Title report 30% less pain in TMJ with chewing   Time 4   Period Weeks   Status New           PT Long Term Goals - 02/24/15 1135    PT LONG TERM GOAL #1   Title be independent in advanced HEP   Time 8   Period Weeks   Status New   PT LONG TERM GOAL #2   Title report a 60% reduction in frequency and intensity of daily headaches   Time 8   Period Weeks   Status New   PT LONG TERM GOAL #3   Title report a 60% reduction in TMJ pain and jaw  tension with chewing and brushing teeth   Time 8   Period Weeks   Status New               Plan - 02/24/15 1147    Clinical Impression Statement Pt presents to PT with chronic history of bilateral jaw pain, tension headaches and neck pain.  Pt demonstrates forward head and scapular elevation in  sitting, significant tension in bilateral masseters, suboccipitals and upper trap.  Pt with clicking in the TMJ with opening and demonstrates normal AROM.  Pt will benefit from skilled PT for manual therapy to bil head and neck, postural education, flexibility and modalities PRN.     Pt will benefit from skilled therapeutic intervention in order to improve on the following deficits Postural dysfunction;Impaired flexibility;Pain;Decreased activity tolerance   Rehab Potential Good   PT Frequency 2x / week   PT Duration 8 weeks   PT Treatment/Interventions ADLs/Self Care Home Management;Cryotherapy;Electrical Stimulation;Dry needling;Moist Heat;Therapeutic exercise;Therapeutic activities;Ultrasound;Neuromuscular re-education;Patient/family education;Manual techniques;Passive range of motion   PT Next Visit Plan soft tissue mobilization to bilateral masseters, temporalis and suboccipitals.  decompression and postural strength.     Consulted and Agree with Plan of Care Patient          G-Codes - 03/15/15 1056    Functional Assessment Tool Used FOTO: 34% limitation   Functional Limitation Other PT primary   Other PT Primary Current Status [id number]) At least 20 percent but less than 40 percent impaired, limited or restricted   Other PT Primary Goal Status [id number]) At least 20 percent but less than 40 percent impaired, limited or restricted       Problem List Patient Active Problem List   Diagnosis Date Noted  . Neck pain 12/26/2014  . Weight loss 12/18/2014  . 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 Trigeminal neuralgia 04/17/2014  . 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 Asthma, chronic 05/13/2013  .  Diarrhea 05/13/2013  . Anxiety state, unspecified 05/13/2013  . Chronic rhinitis 05/13/2013  . Thrush 04/21/2013  . Hemoptysis, unspecified 03/23/2013  . Bronchitis, chronic obstructive w acute bronchitis ([place]) 03/23/2013  . Leg cramps 03/23/2013  . Osteoporosis with pathological fracture 01/30/2013  . Pain in joint, ankle and foot 08/14/2012  . Toe contusion 08/14/2012  . Seasonal allergic rhinitis 07/07/2012  . Sinusitis nasal 07/07/2012  . G E R D 08/15/2007  . Cough 08/15/2007  . VOCAL CORD POLYP, HX OF 08/15/2007    Limmie Schoenberg, PT Mar 15, 2015, 11:56 AM  [facility] [address], [place], [place], [address] Phone: [phone]   Fax:  [phone]  Name: Kristy Garza MRN: [id number] Date of Birth: [birth year]

## 2015-02-24 NOTE — Patient Instructions (Signed)
PERFORM ALL EXERCISES GENTLY AND WITH GOOD POSTURE.    20 SECOND HOLD, 3 REPS TO EACH SIDE. 4-5 TIMES EACH DAY.   AROM: Neck Rotation   Turn head slowly to look over one shoulder, then the other.   AROM: Neck Flexion   Bend head forward.   AROM: Lateral Neck Flexion   Slowly tilt head toward one shoulder, then the other.   Posture - Standing   Good posture is important. Avoid slouching and forward head thrust. Maintain curve in low back and align ears over shoulders, hips over ankles.  Pull your belly button in toward your back bone. Posture Tips DO: - stand tall and erect - keep chin tucked in - keep head and shoulders in alignment - check posture regularly in mirror or large window - pull head back against headrest in car seat;  Change your position often.  Sit with lumbar support. DON'T: - slouch or slump while watching TV or reading - sit, stand or lie in one position  for too long;  Sitting is especially hard on the spine so if you sit at a desk/use the computer, then stand up often! Copyright  VHI. All rights reserved.  Posture - Sitting  Sit upright, head facing forward. Try using a roll to support lower back. Keep shoulders relaxed, and avoid rounded back. Keep hips level with knees. Avoid crossing legs for long periods. Copyright  VHI. All rights reserved.  Chronic neck strain can develop because of poor posture and faulty work habits  Postural strain related to slumped sitting and forward head posture is a leading cause of headaches, neck and upper back pain  General strengthening and flexibility exercises are helpful in the treatment of neck pain.  Most importantly, you should learn to correct the posture that may be contributing to chronic pain.   Change positions frequently  Change your work or home environment to improve posture and mechanics.   Axial Extension (Chin Tuck)    Pull chin in and lengthen back of neck. Hold _5___ seconds while counting out  loud. Repeat _10___ times. Do _3___ sessions per day.  http://gt2.exer.us/450   Copyright  VHI. All rights reserved.  [facility] [address] [place], [place] [address] Phone # [phone] Fax [phone]

## 2015-03-05 ENCOUNTER — Telehealth: Payer: Self-pay | Admitting: Internal Medicine

## 2015-03-05 ENCOUNTER — Encounter: Payer: Self-pay | Admitting: Physical Therapy

## 2015-03-05 MED ORDER — BENZONATATE 200 MG PO CAPS: 200.0000 mg | ORAL_CAPSULE | Freq: Three times a day (TID) | ORAL | Status: DC | PRN

## 2015-03-05 NOTE — Telephone Encounter (Signed)
Ok benzonatate 200 mg, # 15,    1 every 8 hours as needed for cough, refill prn

## 2015-03-05 NOTE — Telephone Encounter (Signed)
Spoke with pt. She is requesting a refill on Tessalon Perles. This was last refilled on 05/30/13. She wants only #15 capsules sent in along with refills since she is in the donut hole.  CY - please advise. Thanks.

## 2015-03-05 NOTE — Telephone Encounter (Signed)
Spoke with the pt and notified [facility] okayed rx for tessalon  Rx was sent to [facility]  Nothing further needed per pt

## 2015-03-11 ENCOUNTER — Ambulatory Visit: Payer: Medicare Other | Attending: Family Medicine

## 2015-03-11 DIAGNOSIS — M542 Cervicalgia: Secondary | ICD-10-CM

## 2015-03-11 DIAGNOSIS — M26623 Arthralgia of bilateral temporomandibular joint: Secondary | ICD-10-CM | POA: Diagnosis not present

## 2015-03-11 DIAGNOSIS — G44221 Chronic tension-type headache, intractable: Secondary | ICD-10-CM | POA: Diagnosis not present

## 2015-03-11 NOTE — Therapy (Signed)
[facility] [address] [place], [place], [address] Phone: [phone]   Fax:  [phone]  Physical Therapy Treatment  Patient Details  Name: Kristy Garza MRN: [id number] Date of Birth: 1951-02-18 Referring Provider: Kenn File, MD  Encounter Date: 03/11/2015      PT End of Session - 03/11/15 1244    Visit Number 2   Number of Visits 10   Date for PT Re-Evaluation 04/21/15   PT Start Time 1229   PT Stop Time 1304   PT Time Calculation (min) 35 min   Activity Tolerance Patient tolerated treatment well  chronic pain and limited ability to exercise   Behavior During Therapy [facility] for tasks assessed/performed      Past Medical History  Diagnosis Date  . Asthma   . Osteoporosis   . Acid reflux   . PONV (postoperative nausea and vomiting)   . Tinnitus   . Allergy   . Interstitial cystitis   . Trigeminal neuralgia     Atypical trigeminal neuralgia  . Arthritis     ARTHRITIS IN NECK BY DR. Michelle Nasuti    Past Surgical History  Procedure Laterality Date  . Abdominal hysterectomy  2000  . 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 Ethmoidectomy  2012  . Septoplasty  1980's  . Cystoscopy with hydrodistension and biopsy N/A 06/11/2012    Procedure: CYSTOSCOPY/BIOPSY/HYDRODISTENSION with Instillation of Pyridium and Marcaine and Kenalog;  Surgeon: Ailene Rud, MD;  Location: [facility];  Service: Urology;  Laterality: N/A;  1 hour requested for this case  BLADDER BIOPSY    There were no vitals filed for this visit.  Visit Diagnosis:  Neck pain  Bilateral temporomandibular joint pain  Chronic tension-type headache, intractable      Subjective Assessment - 03/11/15 1230    Subjective Pt just had injections at [facility] into masseter, teporalis and suboccipitals.  Pt reports that glucose was injected in her face and dextrose into suboccipitals.      Currently in Pain? Yes   Pain Score 4    Pain Location Jaw  neck   Pain Orientation Right;Left   Pain Descriptors / Indicators Dull   Pain Type Chronic pain   Pain Onset More than a month ago   Pain Frequency Constant   Aggravating Factors  chewing, cold weather, brushing teeth   Pain Relieving Factors heating pad, ice packs                         [facility] Adult PT Treatment/Exercise - 03/11/15 0001    Exercises   Exercises Neck;Shoulder   Neck Exercises: Supine   Neck Retraction 20 reps  on foam roll with towel under neck   Other Supine Exercise supine on foam roll for decompression x 5 minutes   Shoulder Exercises: Supine   Horizontal ABduction Both;Strengthening;20 reps;Theraband  on foam roll   Theraband Level (Shoulder Horizontal ABduction) Level 1 (Yellow)   Manual Therapy   Manual Therapy Soft tissue mobilization;Myofascial release   Soft tissue mobilization to bilateral masseter, temporalis and suboccipitals   Myofascial Release to cervical paraspinals and subocciptals.                  PT Short Term Goals - 03/11/15 1239    PT SHORT TERM GOAL #1   Title be independent in initial HEP   Time 4   Period  Weeks   Status On-going   PT SHORT TERM GOAL #2   Title report a 30% reduction in frequency and intenisty of headaches   Time 4   Period Weeks   Status On-going   PT SHORT TERM GOAL #3   Title report 30% less pain in TMJ with chewing   Time 4   Period Weeks   Status On-going           PT Long Term Goals - 02/24/15 1135    PT LONG TERM GOAL #1   Title be independent in advanced HEP   Time 8   Period Weeks   Status New   PT LONG TERM GOAL #2   Title report a 60% reduction in frequency and intensity of daily headaches   Time 8   Period Weeks   Status New   PT LONG TERM GOAL #3   Title report a 60% reduction in TMJ pain and jaw tension with chewing and brushing teeth   Time 8   Period Weeks   Status New                Plan - 03/11/15 1236    Clinical Impression Statement Pt with only 1 session after evaluation.  Pt had injections into face and neck at [facility] today.  Pt without change in symptoms since last session due to limited attendance.  Pt is performing HEP issued at evaluation.  Pt will benefit from skilled PT due to tension in bilateral masseter, suboccipitals and temporalis.  Pt  will consider dry needling.     Pt will benefit from skilled therapeutic intervention in order to improve on the following deficits Postural dysfunction;Impaired flexibility;Pain;Decreased activity tolerance   Rehab Potential Good   PT Frequency 2x / week   PT Duration 8 weeks   PT Treatment/Interventions ADLs/Self Care Home Management;Cryotherapy;Electrical Stimulation;Dry needling;Moist Heat;Therapeutic exercise;Therapeutic activities;Ultrasound;Neuromuscular re-education;Patient/family education;Manual techniques;Passive range of motion   PT Next Visit Plan soft tissue mobilization to bilateral masseters, temporalis and suboccipitals.  decompression and postural strength.  Dry needling at [facility].   Consulted and Agree with Plan of Care Patient        Problem List Patient Active Problem List   Diagnosis Date Noted  . Neck pain 12/26/2014  . Weight loss 12/18/2014  . 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 Trigeminal neuralgia 04/17/2014  . 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 Asthma, chronic 05/13/2013  . Diarrhea 05/13/2013  . Anxiety state, unspecified 05/13/2013  . Chronic rhinitis 05/13/2013  . Thrush 04/21/2013  . Hemoptysis, unspecified 03/23/2013  . Bronchitis, chronic obstructive w acute bronchitis ([place]) 03/23/2013  . Leg cramps 03/23/2013  . Osteoporosis with pathological fracture 01/30/2013  . Pain in joint, ankle and foot 08/14/2012  . Toe contusion 08/14/2012  . Seasonal allergic rhinitis 07/07/2012  . Sinusitis nasal 07/07/2012  . G E R D 08/15/2007  . Cough 08/15/2007  . VOCAL  CORD POLYP, HX OF 08/15/2007    TAKACS,KELLY, PT 03/11/2015, 1:07 PM   [facility] [address], [place], [place], [address] Phone: [phone]   Fax:  [phone]  Name: Kristy Garza MRN: [id number] Date of Birth: 1950-10-04

## 2015-03-12 ENCOUNTER — Other Ambulatory Visit: Payer: Medicare Other

## 2015-03-18 ENCOUNTER — Ambulatory Visit: Payer: Medicare Other

## 2015-03-18 DIAGNOSIS — M542 Cervicalgia: Secondary | ICD-10-CM | POA: Diagnosis not present

## 2015-03-18 DIAGNOSIS — M26623 Arthralgia of bilateral temporomandibular joint: Secondary | ICD-10-CM | POA: Diagnosis not present

## 2015-03-18 DIAGNOSIS — G44221 Chronic tension-type headache, intractable: Secondary | ICD-10-CM | POA: Diagnosis not present

## 2015-03-18 NOTE — Therapy (Addendum)
[facility] [address] [place], [place], [address] Phone: [phone]   Fax:  [phone]  Physical Therapy Treatment  Patient Details  Name: Kristy Garza MRN: [id number] Date of Birth: Aug 16, 1950 Referring Provider: Kenn File, MD  Encounter Date: 03/18/2015      PT End of Session - 03/18/15 1302    Visit Number 3   Number of Visits 10   Date for PT Re-Evaluation 04/21/15   PT Start Time 1228   PT Stop Time 1302   PT Time Calculation (min) 34 min   Activity Tolerance Patient tolerated treatment well  manual therapy and limited exercise today   Behavior During Therapy [facility] for tasks assessed/performed      Past Medical History  Diagnosis Date  . Asthma   . Osteoporosis   . Acid reflux   . PONV (postoperative nausea and vomiting)   . Tinnitus   . Allergy   . Interstitial cystitis   . Trigeminal neuralgia     Atypical trigeminal neuralgia  . Arthritis     ARTHRITIS IN NECK BY DR. Michelle Nasuti    Past Surgical History  Procedure Laterality Date  . Abdominal hysterectomy  2000  . 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 Ethmoidectomy  2012  . Septoplasty  1980's  . Cystoscopy with hydrodistension and biopsy N/A 06/11/2012    Procedure: CYSTOSCOPY/BIOPSY/HYDRODISTENSION with Instillation of Pyridium and Marcaine and Kenalog;  Surgeon: Ailene Rud, MD;  Location: [facility];  Service: Urology;  Laterality: N/A;  1 hour requested for this case  BLADDER BIOPSY    There were no vitals filed for this visit.  Visit Diagnosis:  Neck pain  Bilateral temporomandibular joint pain  Chronic tension-type headache, intractable      Subjective Assessment - 03/18/15 1234    Subjective Had accupuncture and saw chiropractor this week.  Another injection at [facility] scheduled for next week.  Things are feeling a little better.   Pertinent History  trigeminal neuralgia on the Rt-tried Gamma Knife to treat, injections (glucose) in to face at [facility] recently, pt wears a mouth guard at night   Currently in Pain? Yes   Pain Score 5    Pain Location Jaw  neck   Pain Orientation Right;Left   Pain Descriptors / Indicators Dull   Pain Type Chronic pain   Pain Frequency Constant   Aggravating Factors  chewing, cold weather, brushing teeth   Pain Relieving Factors heating pad                         [facility] Adult PT Treatment/Exercise - 03/18/15 0001    Neck Exercises: Supine   Neck Retraction 20 reps  on foam roll with towel under neck   Other Supine Exercise supine on foam roll for decompression x 5 minutes   Shoulder Exercises: Supine   Horizontal ABduction Both;Strengthening;20 reps;Theraband  on foam roll   Theraband Level (Shoulder Horizontal ABduction) Level 1 (Yellow)   Manual Therapy   Manual Therapy Soft tissue mobilization;Myofascial release   Soft tissue mobilization to bilateral masseter, temporalis and suboccipitals   Myofascial Release to cervical paraspinals and subocciptals.                  PT Short Term Goals - 03/18/15 1236    PT SHORT TERM GOAL #1   Title be independent in initial HEP  Status Achieved   PT SHORT TERM GOAL #2   Title report a 30% reduction in frequency and intenisty of headaches   Time 4   Period Weeks   PT SHORT TERM GOAL #3   Title report 30% less pain in TMJ with chewing   Time 4   Period Weeks   Status On-going           PT Long Term Goals - 02/24/15 1135    PT LONG TERM GOAL #1   Title be independent in advanced HEP   Time 8   Period Weeks   Status New   PT LONG TERM GOAL #2   Title report a 60% reduction in frequency and intensity of daily headaches   Time 8   Period Weeks   Status New   PT LONG TERM GOAL #3   Title report a 60% reduction in TMJ pain and jaw tension with chewing and brushing teeth   Time 8   Period Weeks    Status New               Plan - 03/18/15 1236    Clinical Impression Statement Pt has been seeing chirpractor and accupuncturist in addition to PT and injections into face and neck.  Pt is already independent with exercises for TMJ and PT reviewed these today.  Pt reports 5% reduction in symptoms since the start of care.  Pt with chronic jaw and neck pain and demonstrates tension in bilateral masseters, suboccipials and temporalis.  Pt will have dry needling next week.  Pt will benefit from skilled PT to relieve tension and improve postural strength.     Pt will benefit from skilled therapeutic intervention in order to improve on the following deficits Postural dysfunction;Impaired flexibility;Pain;Decreased activity tolerance   Rehab Potential Good   PT Frequency 2x / week   PT Duration 8 weeks   PT Treatment/Interventions ADLs/Self Care Home Management;Cryotherapy;Electrical Stimulation;Dry needling;Moist Heat;Therapeutic exercise;Therapeutic activities;Ultrasound;Neuromuscular re-education;Patient/family education;Manual techniques;Passive range of motion   PT Next Visit Plan soft tissue mobilization to bilateral masseters, temporalis and suboccipitals.  decompression and postural strength.  Dry needling at [facility].   Consulted and Agree with Plan of Care Patient        Problem List Patient Active Problem List   Diagnosis Date Noted  . Neck pain 12/26/2014  . Weight loss 12/18/2014  . 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 Trigeminal neuralgia 04/17/2014  . 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 Asthma, chronic 05/13/2013  . Diarrhea 05/13/2013  . Anxiety state, unspecified 05/13/2013  . Chronic rhinitis 05/13/2013  . Thrush 04/21/2013  . Hemoptysis, unspecified 03/23/2013  . Bronchitis, chronic obstructive w acute bronchitis ([place]) 03/23/2013  . Leg cramps 03/23/2013  . Osteoporosis with pathological fracture 01/30/2013  . Pain in joint, ankle and foot 08/14/2012  . Toe  contusion 08/14/2012  . Seasonal allergic rhinitis 07/07/2012  . Sinusitis nasal 07/07/2012  . G E R D 08/15/2007  . Cough 08/15/2007  . VOCAL CORD POLYP, HX OF 08/15/2007    Kristy Garza, PT 03/18/2015, 1:03 PM PHYSICAL THERAPY DISCHARGE SUMMARY G-codes: Other PT primary Goal status: CJ D/C status: CJ Visits from Start of Care: 3   Current functional level related to goals / functional outcomes: Pt didn't return to PT after session 03/18/15.  See above for most current status.     Remaining deficits: Pt with chronic neck and TMJ pain.  No significant change since the start of care.     Education /  Equipment: Posture Plan: Patient agrees to discharge.  Patient goals were not met. Patient is being discharged due to not returning since the last visit.  ?????    Sigurd Sos, PT 04/22/2015 9:04 AM [facility] [address] [place], [place], [address] Phone: [phone]   Fax:  [phone]  Name: Kristy Garza MRN: [id number] Date of Birth: July 19, 1950

## 2015-03-19 ENCOUNTER — Ambulatory Visit: Payer: Medicare Other | Admitting: Allergy and Immunology

## 2015-03-25 ENCOUNTER — Ambulatory Visit: Payer: Medicare Other | Admitting: Physical Therapy

## 2015-03-31 ENCOUNTER — Telehealth: Payer: Self-pay | Admitting: Internal Medicine

## 2015-03-31 NOTE — Telephone Encounter (Signed)
Patient notified of CY's recommendations.  Patient refused cough syrup.  Said she will just continue taking Delsym and Mucinex DM.   Patient advised to let us know if she starts getting fever or purulent sputum. Nothing further needed.

## 2015-03-31 NOTE — Telephone Encounter (Signed)
These viral infections are all over town. Antibiotics won't help without fever and purulent sputum. Offer prometh codeine 120 nml,   5 ml every 6 hours as needed

## 2015-03-31 NOTE — Telephone Encounter (Signed)
Spoke with pt. Reports cough and chest congestion. Cough is producing clear mucus. Denies chest tightness, SOB, wheezing or fever. Has been using Delsym, Mucniex, Sudafed, Allegra and nasal sprays. Would like CY's recommendations. CY - please advise. Thanks.  Allergies  Allergen Reactions  . Augmentin [Amoxicillin-Pot Clavulanate] Diarrhea  . Avelox [Moxifloxacin Hcl In Nacl] Other (See Comments)    Tremors   . Biaxin [Clarithromycin]     Unsure of reaction  . Cedax [Ceftibuten] Other (See Comments)    tremors  . Ciprofloxacin Other (See Comments)    Blurred vision  . Clindamycin/Lincomycin     tachycardia  . Gabapentin     Constipation  . Levofloxacin Other (See Comments)    Feels dehydrated  . Lyrica [Pregabalin]     Drowsiness, dry mouth  . Singulair [Montelukast Sodium] Other (See Comments)    Numbness and tingling in hand.  . Sulfonamide Derivatives Other (See Comments)    Gi upset   Current Outpatient Prescriptions on File Prior to Visit  Medication Sig Dispense Refill  . acetaminophen (TYLENOL) 500 MG tablet Take 500 mg by mouth every 6 (six) hours as needed.    [name] albuterol (PROVENTIL HFA;VENTOLIN HFA) 108 (90 BASE) MCG/ACT inhaler Inhale 2 puffs into the lungs every 4 (four) hours as needed for wheezing or shortness of breath. 1 Inhaler prn  . albuterol (PROVENTIL) (2.5 MG/3ML) 0.083% nebulizer solution Take 3 mLs (2.5 mg total) by nebulization every 6 (six) hours as needed for wheezing or shortness of breath. (Patient not taking: Reported on 02/02/2015) 150 mL 1  . aspirin-acetaminophen-caffeine (EXCEDRIN MIGRAINE) [id number] MG per tablet Take 1 tablet by mouth every 6 (six) hours as needed for headache.    [name] azelastine (ASTELIN) 0.1 % nasal spray Place 2 sprays into both nostrils 2 (two) times daily. Use in each nostril as directed 30 mL 12  . baclofen (LIORESAL) 10 MG tablet Take 5 mg by mouth 2 (two) times daily.    . benzonatate (TESSALON) 200 MG capsule Take 1  capsule (200 mg total) by mouth every 8 (eight) hours as needed for cough. 15 capsule 5  . calcium citrate-vitamin D (CITRACAL+D) 315-200 MG-UNIT per tablet Take 1 tablet by mouth.    . Calcium Glycerophosphate (PRELIEF PO) Take 1 tablet by mouth as needed (prior to acid containing foods).    . Cholecalciferol (VITAMIN D PO) Take 4,000 Units by mouth daily.     . diclofenac sodium (VOLTAREN) 1 % GEL Apply 1 g topically.     [name] ELMIRON 100 MG capsule Take 100 mg by mouth 2 (two) times daily.     . fexofenadine (ALLEGRA) 60 MG tablet Take 60 mg by mouth 2 (two) times daily.    . fluticasone (FLONASE) 50 MCG/ACT nasal spray Place 2 sprays into both nostrils daily. 16 g 11  . guaiFENesin (MUCINEX) 600 MG 12 hr tablet Take 600 mg by mouth 2 (two) times daily.    [name] lidocaine (XYLOCAINE) 2 % jelly Apply topically as needed.    . Linaclotide (LINZESS) 290 MCG CAPS capsule TAKE 1 CAPSULE (290 MCG TOTAL) BY MOUTH DAILY. (Patient not taking: Reported on 02/02/2015) 30 capsule 5  . Magnesium Oxide 400 (240 MG) MG TABS Take 1 tablet by mouth daily.    . Multiple Vitamin (MULTIVITAMIN) tablet Take 1 tablet by mouth daily.    . Nutritional Supplements (DHEA PO) Take 5 mg by mouth daily.     . Omega-3 Fatty Acids (FISH OIL) 1000 MG  CAPS Take 1 capsule by mouth 2 (two) times daily.     . pantoprazole (PROTONIX) 40 MG tablet Take 40 mg by mouth daily.    . traMADol (ULTRAM) 50 MG tablet Take 1 tablet (50 mg total) by mouth every 6 (six) hours as needed. 30 tablet 0   Current Facility-Administered Medications on File Prior to Visit  Medication Dose Route Frequency Provider Last Rate Last Dose  . bupivacaine (MARCAINE) 0.5 % 10 mL, triamcinolone acetonide (KENALOG-40) 40 mg injection   Subcutaneous Once Carolan Clines, MD      . bupivacaine (MARCAINE) 0.5 % 15 mL, phenazopyridine (PYRIDIUM) 400 mg bladder mixture   Bladder Instillation Once Carolan Clines, MD      . cyanocobalamin ((VITAMIN B-12)) injection  1,000 mcg  1,000 mcg Intramuscular Q30 days [name], MD   1,000 mcg at 06/16/14 1324

## 2015-04-01 ENCOUNTER — Telehealth: Payer: Self-pay | Admitting: Family Medicine

## 2015-04-01 ENCOUNTER — Ambulatory Visit: Payer: Medicare Other

## 2015-04-01 NOTE — Telephone Encounter (Signed)
Patient advised to take OTC vitamins and continue with allegra.  Increase fluids and wait for improvement over another week.

## 2015-04-02 ENCOUNTER — Ambulatory Visit (INDEPENDENT_AMBULATORY_CARE_PROVIDER_SITE_OTHER): Payer: Medicare Other | Admitting: Family Medicine

## 2015-04-02 ENCOUNTER — Encounter: Payer: Self-pay | Admitting: Family Medicine

## 2015-04-02 ENCOUNTER — Ambulatory Visit: Payer: Medicare Other | Admitting: Pediatrics

## 2015-04-02 VITALS — BP 105/74 | HR 74 | Temp 97.1°F | Ht 67.5 in | Wt 111.4 lb

## 2015-04-02 DIAGNOSIS — J0100 Acute maxillary sinusitis, unspecified: Secondary | ICD-10-CM

## 2015-04-02 MED ORDER — AMOXICILLIN 875 MG PO TABS: 875.0000 mg | ORAL_TABLET | Freq: Two times a day (BID) | ORAL | Status: DC

## 2015-04-02 MED ORDER — LINACLOTIDE 290 MCG PO CAPS: ORAL_CAPSULE | ORAL | Status: DC

## 2015-04-02 NOTE — Progress Notes (Signed)
Subjective:    Patient ID: Kristy Garza, female    DOB: 07-Aug-1950, 65 y.o.   MRN: [id number]  HPI  65 year old female with a 10 day history of upper respiratory congestion and  pain in her teeth. She has been taking phenylephrine and Delsym but she also takes Allegra and Benadryl and Astelin nasal spray all of which may be working against her congestion.  Patient Active Problem List   Diagnosis Date Noted  . Neck pain 12/26/2014  . Weight loss 12/18/2014  . 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 Trigeminal neuralgia 04/17/2014  . 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 Asthma, chronic 05/13/2013  . Diarrhea 05/13/2013  . Anxiety state, unspecified 05/13/2013  . Chronic rhinitis 05/13/2013  . Thrush 04/21/2013  . Hemoptysis, unspecified 03/23/2013  . 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 Leg cramps 03/23/2013  . Osteoporosis with pathological fracture 01/30/2013  . Pain in joint, ankle and foot 08/14/2012  . Toe contusion 08/14/2012  . Seasonal allergic rhinitis 07/07/2012  . Sinusitis nasal 07/07/2012  . G E R D 08/15/2007  . Cough 08/15/2007  . VOCAL CORD POLYP, HX OF 08/15/2007   Outpatient Encounter Prescriptions as of 04/02/2015  Medication Sig  . acetaminophen (TYLENOL) 500 MG tablet Take 500 mg by mouth every 6 (six) hours as needed.  [name] albuterol (PROVENTIL HFA;VENTOLIN HFA) 108 (90 BASE) MCG/ACT inhaler Inhale 2 puffs into the lungs every 4 (four) hours as needed for wheezing or shortness of breath.  [name] albuterol (PROVENTIL) (2.5 MG/3ML) 0.083% nebulizer solution Take 3 mLs (2.5 mg total) by nebulization every 6 (six) hours as needed for wheezing or shortness of breath.  [name] aspirin-acetaminophen-caffeine (EXCEDRIN MIGRAINE) 250-250-65 MG per tablet Take 1 tablet by mouth every 6 (six) hours as needed for headache.  [name] azelastine (ASTELIN) 0.1 % nasal spray Place 2 sprays into both nostrils 2 (two) times daily. Use in each nostril as directed  .  baclofen (LIORESAL) 10 MG tablet Take 5 mg by mouth 2 (two) times daily.  . benzonatate (TESSALON) 200 MG capsule Take 1 capsule (200 mg total) by mouth every 8 (eight) hours as needed for cough.  . calcium citrate-vitamin D (CITRACAL+D) 315-200 MG-UNIT per tablet Take 1 tablet by mouth.  . Calcium Glycerophosphate (PRELIEF PO) Take 1 tablet by mouth as needed (prior to acid containing foods).  . Cholecalciferol (VITAMIN D PO) Take 4,000 Units by mouth daily.   . diclofenac sodium (VOLTAREN) 1 % GEL Apply 1 g topically.   [name] ELMIRON 100 MG capsule Take 100 mg by mouth 2 (two) times daily.   . fexofenadine (ALLEGRA) 60 MG tablet Take 60 mg by mouth 2 (two) times daily.  . fluticasone (FLONASE) 50 MCG/ACT nasal spray Place 2 sprays into both nostrils daily.  [name] guaiFENesin (MUCINEX) 600 MG 12 hr tablet Take 600 mg by mouth 2 (two) times daily.  [name] lidocaine (XYLOCAINE) 2 % jelly Apply topically as needed.  . Linaclotide (LINZESS) 290 MCG CAPS capsule TAKE 1 CAPSULE (290 MCG TOTAL) BY MOUTH DAILY.  . Magnesium Oxide 400 (240 MG) MG TABS Take 1 tablet by mouth daily.  . Multiple Vitamin (MULTIVITAMIN) tablet Take 1 tablet by mouth daily.  . Nutritional Supplements (DHEA PO) Take 5 mg by mouth daily.   . Omega-3 Fatty Acids (FISH OIL) 1000 MG CAPS Take 1 capsule by mouth 2 (two) times daily.   . pantoprazole (PROTONIX) 40 MG tablet Take 40 mg by mouth daily.  . traMADol (  ULTRAM) 50 MG tablet Take 1 tablet (50 mg total) by mouth every 6 (six) hours as needed.   Facility-Administered Encounter Medications as of 04/02/2015  Medication  . bupivacaine (MARCAINE) 0.5 % 10 mL, triamcinolone acetonide (KENALOG-40) 40 mg injection  . bupivacaine (MARCAINE) 0.5 % 15 mL, phenazopyridine (PYRIDIUM) 400 mg bladder mixture  . cyanocobalamin ((VITAMIN B-12)) injection 1,000 mcg      Review of Systems  Constitutional: Negative.   HENT: Positive for congestion and dental problem.   Respiratory: Negative.     Cardiovascular: Negative.   Neurological: Negative.   Psychiatric/Behavioral: Negative.        Objective:   Physical Exam  Constitutional: She is oriented to person, place, and time. She appears well-developed and well-nourished.  HENT:  Maxillary sinuses are tender to percussion  Cardiovascular: Normal rate and regular rhythm.   Pulmonary/Chest: Effort normal and breath sounds normal.  Neurological: She is alert and oriented to person, place, and time.          Assessment & Plan:  1. Acute maxillary sinusitis, recurrence not specified Given longevity of symptoms and dental pain, suspect we are dealing with sinus infection. Hold all antihistamines as we discussed but continue with Flonase and Sudafed and I will add amoxicillin 875 twice a day. Should be noted that she says she is allergic to Augmentin but not really an allergy since she gets diarrhea and has side effect for some folks.  Wardell Honour MD

## 2015-04-03 ENCOUNTER — Telehealth: Payer: Self-pay | Admitting: Family Medicine

## 2015-04-03 MED ORDER — AMOXICILLIN 500 MG PO CAPS: 500.0000 mg | ORAL_CAPSULE | Freq: Three times a day (TID) | ORAL | Status: DC

## 2015-04-03 NOTE — Telephone Encounter (Signed)
Patient advised to check with pharmacy.

## 2015-04-03 NOTE — Telephone Encounter (Signed)
Detailed message left for patient.

## 2015-04-03 NOTE — Telephone Encounter (Signed)
Patient has requested lower dose of amoxicillin

## 2015-04-08 ENCOUNTER — Ambulatory Visit (INDEPENDENT_AMBULATORY_CARE_PROVIDER_SITE_OTHER): Payer: Medicare Other | Admitting: Family Medicine

## 2015-04-08 ENCOUNTER — Ambulatory Visit: Payer: Medicare Other | Admitting: Family Medicine

## 2015-04-08 ENCOUNTER — Encounter: Payer: Self-pay | Admitting: Family Medicine

## 2015-04-08 VITALS — BP 117/79 | HR 72 | Temp 97.5°F | Ht 67.5 in | Wt 109.2 lb

## 2015-04-08 DIAGNOSIS — M792 Neuralgia and neuritis, unspecified: Secondary | ICD-10-CM | POA: Diagnosis not present

## 2015-04-08 DIAGNOSIS — G629 Polyneuropathy, unspecified: Secondary | ICD-10-CM | POA: Diagnosis not present

## 2015-04-08 DIAGNOSIS — G479 Sleep disorder, unspecified: Secondary | ICD-10-CM | POA: Diagnosis not present

## 2015-04-08 MED ORDER — GABAPENTIN 100 MG PO CAPS: 100.0000 mg | ORAL_CAPSULE | Freq: Every day | ORAL | Status: DC

## 2015-04-08 NOTE — Progress Notes (Signed)
Subjective:    Patient ID: Kristy Garza, female    DOB: 08-Feb-1951, 65 y.o.   MRN: [id number]  HPI 65 year old female who presents today with numbness in her feet. She feels like this is related to the amoxicillin that was started on 15. I am not aware that neuropathy-like symptoms are related to use of amoxicillin. In questioning her, she there is a history of back problem and there may be an association there is well B12 deficiency would be another consideration.  She also spent lots of time today asking about her constipation. She has been on Linzess with some success but now feels like she needs additional supplement. She asked about Dulcolax, milk of magnesia, senna, and Epsom salts. I checked to see that her dose of lens as is at maximal level which it is. She realizes she needs to eat more fiber and bulk and drink more liquids and get more exercise but finds it easier to take a pill.  She also tells me about her TMJ. She sees a integrative doctor in [place] who injects glucose for that problem.  Patient Active Problem List   Diagnosis Date Noted  . Neck pain 12/26/2014  . Weight loss 12/18/2014  . 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 Trigeminal neuralgia 04/17/2014  . 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 Asthma, chronic 05/13/2013  . Diarrhea 05/13/2013  . Anxiety state, unspecified 05/13/2013  . Chronic rhinitis 05/13/2013  . Thrush 04/21/2013  . Hemoptysis, unspecified 03/23/2013  . 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 Leg cramps 03/23/2013  . Osteoporosis with pathological fracture 01/30/2013  . Pain in joint, ankle and foot 08/14/2012  . Toe contusion 08/14/2012  . Seasonal allergic rhinitis 07/07/2012  . Sinusitis nasal 07/07/2012  . G E R D 08/15/2007  . Cough 08/15/2007  . VOCAL CORD POLYP, HX OF 08/15/2007   Outpatient Encounter Prescriptions as of 04/08/2015  Medication Sig  . acetaminophen (TYLENOL) 500 MG tablet Take 500 mg by  mouth every 6 (six) hours as needed.  [name] albuterol (PROVENTIL HFA;VENTOLIN HFA) 108 (90 BASE) MCG/ACT inhaler Inhale 2 puffs into the lungs every 4 (four) hours as needed for wheezing or shortness of breath.  [name] albuterol (PROVENTIL) (2.5 MG/3ML) 0.083% nebulizer solution Take 3 mLs (2.5 mg total) by nebulization every 6 (six) hours as needed for wheezing or shortness of breath.  [name] aspirin-acetaminophen-caffeine (EXCEDRIN MIGRAINE) 250-250-65 MG per tablet Take 1 tablet by mouth every 6 (six) hours as needed for headache.  [name] azelastine (ASTELIN) 0.1 % nasal spray Place 2 sprays into both nostrils 2 (two) times daily. Use in each nostril as directed  . baclofen (LIORESAL) 10 MG tablet Take 5 mg by mouth 2 (two) times daily.  . calcium citrate-vitamin D (CITRACAL+D) 315-200 MG-UNIT per tablet Take 1 tablet by mouth.  . Calcium Glycerophosphate (PRELIEF PO) Take 1 tablet by mouth as needed (prior to acid containing foods).  . Cholecalciferol (VITAMIN D PO) Take 4,000 Units by mouth daily.   . diclofenac sodium (VOLTAREN) 1 % GEL Apply 1 g topically.   [name] ELMIRON 100 MG capsule Take 100 mg by mouth 2 (two) times daily.   . fexofenadine (ALLEGRA) 60 MG tablet Take 60 mg by mouth 2 (two) times daily.  . fluticasone (FLONASE) 50 MCG/ACT nasal spray Place 2 sprays into both nostrils daily.  [name] guaiFENesin (MUCINEX) 600 MG 12 hr tablet Take 600 mg by mouth 2 (two) times daily.  [name] lidocaine (XYLOCAINE) 2 %  jelly Apply topically as needed.  . Linaclotide (LINZESS) 290 MCG CAPS capsule TAKE 1 CAPSULE (290 MCG TOTAL) BY MOUTH DAILY.  . Magnesium Oxide 400 (240 MG) MG TABS Take 1 tablet by mouth daily.  . Multiple Vitamin (MULTIVITAMIN) tablet Take 1 tablet by mouth daily.  . Nutritional Supplements (DHEA PO) Take 5 mg by mouth daily.   . Omega-3 Fatty Acids (FISH OIL) 1000 MG CAPS Take 1 capsule by mouth 2 (two) times daily.   . pantoprazole (PROTONIX) 40 MG tablet Take 40 mg by mouth daily.  . traMADol (ULTRAM)  50 MG tablet Take 1 tablet (50 mg total) by mouth every 6 (six) hours as needed.  . benzonatate (TESSALON) 200 MG capsule Take 1 capsule (200 mg total) by mouth every 8 (eight) hours as needed for cough. (Patient not taking: Reported on 04/08/2015)  . [DISCONTINUED] amoxicillin (AMOXIL) 500 MG capsule Take 1 capsule (500 mg total) by mouth 3 (three) times daily.   Facility-Administered Encounter Medications as of 04/08/2015  Medication  . bupivacaine (MARCAINE) 0.5 % 10 mL, triamcinolone acetonide (KENALOG-40) 40 mg injection  . bupivacaine (MARCAINE) 0.5 % 15 mL, phenazopyridine (PYRIDIUM) 400 mg bladder mixture  . cyanocobalamin ((VITAMIN B-12)) injection 1,000 mcg      Review of Systems  Constitutional: Negative.   HENT: Positive for congestion.   Respiratory: Negative.   Cardiovascular: Negative.   Gastrointestinal: Positive for constipation.  Neurological: Positive for numbness.  Psychiatric/Behavioral: Negative.        Objective:   Physical Exam  Constitutional: She is oriented to person, place, and time. She appears well-developed.  HENT:  Head: Normocephalic.  Neurological: She is alert and oriented to person, place, and time.  Exam of her feet shows decreased sensation with monofilament testing. Pulses are intact.  Psychiatric: She has a normal mood and affect. Her behavior is normal.  Thought content seems a little bit out of mainstream with some different ideations          Assessment & Plan:  1. Neuralgia I doubt that the amoxicillin is causing the neuropathic symptoms but will switch her off amoxicillin to Ceftin ear for the remainder of her treatment course. 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 Begin Neurontin 100 mg at night. In the past she has felt that this constipates her as well so this may be difficult to titrate upward for maximal effect. - TSH - Vitamin B12  Wardell Honour MD

## 2015-04-09 LAB — VITAMIN B12: VITAMIN B 12: 602 pg/mL (ref 211–946)

## 2015-04-09 LAB — TSH: TSH: 0.377 u[IU]/mL — AB (ref 0.450–4.500)

## 2015-04-10 ENCOUNTER — Telehealth: Payer: Self-pay | Admitting: Family Medicine

## 2015-04-10 NOTE — Telephone Encounter (Signed)
Pt aware MD has not reviewed labs yet

## 2015-04-11 ENCOUNTER — Telehealth: Payer: Self-pay | Admitting: Family Medicine

## 2015-04-11 NOTE — Telephone Encounter (Signed)
Patient informed and voices understanding 

## 2015-04-11 NOTE — Telephone Encounter (Signed)
As long as she is still passing gas then yes she can take 1 every day until she starts having looser bowel movements. If at any point she stopped passing gas or she passes blood then she needs to come in and be evaluated further. Caryl Pina, MD [facility] 04/11/2015, 8:32 AM

## 2015-04-13 ENCOUNTER — Ambulatory Visit (INDEPENDENT_AMBULATORY_CARE_PROVIDER_SITE_OTHER): Payer: Medicare Other

## 2015-04-13 ENCOUNTER — Telehealth: Payer: Self-pay | Admitting: Family Medicine

## 2015-04-13 ENCOUNTER — Other Ambulatory Visit: Payer: Medicare Other

## 2015-04-13 ENCOUNTER — Other Ambulatory Visit: Payer: Self-pay | Admitting: Gastroenterology

## 2015-04-13 DIAGNOSIS — R1084 Generalized abdominal pain: Secondary | ICD-10-CM | POA: Diagnosis not present

## 2015-04-13 NOTE — Telephone Encounter (Signed)
Spoke with pt and will contact Dr. Paulla Fore s office  to verify order

## 2015-04-17 ENCOUNTER — Ambulatory Visit (INDEPENDENT_AMBULATORY_CARE_PROVIDER_SITE_OTHER): Payer: Medicare Other | Admitting: Family Medicine

## 2015-04-17 DIAGNOSIS — J0101 Acute recurrent maxillary sinusitis: Secondary | ICD-10-CM | POA: Diagnosis not present

## 2015-04-17 MED ORDER — TRIAMCINOLONE ACETONIDE 40 MG/ML IJ SUSP
60.0000 mg | Freq: Once | INTRAMUSCULAR | Status: AC
  Administered 2015-04-17: 60 mg via INTRAMUSCULAR

## 2015-04-17 MED ORDER — CEFUROXIME AXETIL 250 MG PO TABS: 250.0000 mg | ORAL_TABLET | Freq: Two times a day (BID) | ORAL | Status: DC

## 2015-04-17 NOTE — Patient Instructions (Signed)
Great to see you!  Take Ceftin twice daily every day until finished Try to take mucinex 12 hour twice daily for 4 days Continue the neti pot  Sinusitis, Adult Sinusitis is redness, soreness, and inflammation of the paranasal sinuses. Paranasal sinuses are air pockets within the bones of your face. They are located beneath your eyes, in the middle of your forehead, and above your eyes. In healthy paranasal sinuses, mucus is able to drain out, and air is able to circulate through them by way of your nose. However, when your paranasal sinuses are inflamed, mucus and air can become trapped. This can allow bacteria and other germs to grow and cause infection. Sinusitis can develop quickly and last only a short time (acute) or continue over a long period (chronic). Sinusitis that lasts for more than 12 weeks is considered chronic. CAUSES Causes of sinusitis include:  Allergies.  Structural abnormalities, such as displacement of the cartilage that separates your nostrils (deviated septum), which can decrease the air flow through your nose and sinuses and affect sinus drainage.  Functional abnormalities, such as when the small hairs (cilia) that line your sinuses and help remove mucus do not work properly or are not present. SIGNS AND SYMPTOMS Symptoms of acute and chronic sinusitis are the same. The primary symptoms are pain and pressure around the affected sinuses. Other symptoms include:  Upper toothache.  Earache.  Headache.  Bad breath.  Decreased sense of smell and taste.  A cough, which worsens when you are lying flat.  Fatigue.  Fever.  Thick drainage from your nose, which often is green and may contain pus (purulent).  Swelling and warmth over the affected sinuses. DIAGNOSIS Your health care provider will perform a physical exam. During your exam, your health care provider may perform any of the following to help determine if you have acute sinusitis or chronic  sinusitis:  Look in your nose for signs of abnormal growths in your nostrils (nasal polyps).  Tap over the affected sinus to check for signs of infection.  View the inside of your sinuses using an imaging device that has a light attached (endoscope). If your health care provider suspects that you have chronic sinusitis, one or more of the following tests may be recommended:  Allergy tests.  Nasal culture. A sample of mucus is taken from your nose, sent to a lab, and screened for bacteria.  Nasal cytology. A sample of mucus is taken from your nose and examined by your health care provider to determine if your sinusitis is related to an allergy. TREATMENT Most cases of acute sinusitis are related to a viral infection and will resolve on their own within 10 days. Sometimes, medicines are prescribed to help relieve symptoms of both acute and chronic sinusitis. These may include pain medicines, decongestants, nasal steroid sprays, or saline sprays. However, for sinusitis related to a bacterial infection, your health care provider will prescribe antibiotic medicines. These are medicines that will help kill the bacteria causing the infection. Rarely, sinusitis is caused by a fungal infection. In these cases, your health care provider will prescribe antifungal medicine. For some cases of chronic sinusitis, surgery is needed. Generally, these are cases in which sinusitis recurs more than 3 times per year, despite other treatments. HOME CARE INSTRUCTIONS  Drink plenty of water. Water helps thin the mucus so your sinuses can drain more easily.  Use a humidifier.  Inhale steam 3-4 times a day (for example, sit in the bathroom with the shower  running).  Apply a warm, moist washcloth to your face 3-4 times a day, or as directed by your health care provider.  Use saline nasal sprays to help moisten and clean your sinuses.  Take medicines only as directed by your health care provider.  If you were  prescribed either an antibiotic or antifungal medicine, finish it all even if you start to feel better. SEEK IMMEDIATE MEDICAL CARE IF:  You have increasing pain or severe headaches.  You have nausea, vomiting, or drowsiness.  You have swelling around your face.  You have vision problems.  You have a stiff neck.  You have difficulty breathing.   This information is not intended to replace advice given to you by your health care provider. Make sure you discuss any questions you have with your health care provider.   Document Released: 03/14/2005 Document Revised: 04/04/2014 Document Reviewed: 03/29/2011 Elsevier Interactive Patient Education [employer].

## 2015-04-17 NOTE — Progress Notes (Signed)
   HPI  Patient presents today here with continued sinus pressure, cough, and congestion.  Patient explains that she's only been taking amoxicillin intermittently. She has continued sinus pressure and cough She's tried Mucinex, Delsym, and Tessalon without improvement in her cough. She states that she has very thick mucus that's difficult to cough up  She does have continued teeth pain   PMH: Smoking status noted ROS: Per HPI  Objective: BP 114/73 mmHg  Pulse 64  Temp(Src) 97.1 F (36.2 C) (Oral)  Ht 5\' 7"  (1.702 m)  Wt 109 lb (49.442 kg)  BMI 17.07 kg/m2 Gen: NAD, alert, cooperative with exam HEENT: NCAT, TMs normal bilaterally, oropharynx clear CV: RRR, good S1/S2, no murmur Resp: CTABL, no wheezes, non-labored Ext: No edema, warm Neuro: Alert and oriented, No gross deficits  Assessment and plan:  # Acute maxillary sinusitis Continued pain, poor compliance with amoxicillin We discussed her options and settled on taking Ceftin twice daily for 7 days IM Kenalog given in the clinic today Scheduled 12 hour mucinex 3-4 days RTC if worsening or does not improve as expected.     Meds ordered this encounter  Medications  . cefUROXime (CEFTIN) 250 MG tablet    Sig: Take 1 tablet (250 mg total) by mouth 2 (two) times daily with a meal.    Dispense:  20 tablet    Refill:  0  . triamcinolone acetonide (KENALOG-40) injection 60 mg    Sig:     Laroy Apple, MD [facility] 04/17/2015, 7:00 PM

## 2015-04-22 ENCOUNTER — Telehealth: Payer: Self-pay | Admitting: Family Medicine

## 2015-04-22 ENCOUNTER — Other Ambulatory Visit: Payer: Self-pay | Admitting: Family Medicine

## 2015-04-22 NOTE — Telephone Encounter (Signed)
Stp and she states since she started the antibiotic she has noticed her mouth is try and she is thirsty. Pt is also taking mucinex, advised that will cause her to have a dry mouth and she is supposed to increase her water intake with mucinex and that could also be why she is having headaches. Pt advised to increase fluid intake and if she continues to have problems to CB. Pt voiced understanding.

## 2015-04-23 ENCOUNTER — Other Ambulatory Visit: Payer: Self-pay | Admitting: Nurse Practitioner

## 2015-04-25 ENCOUNTER — Telehealth: Payer: Self-pay | Admitting: Family Medicine

## 2015-04-29 ENCOUNTER — Ambulatory Visit: Payer: Medicare Other | Admitting: Family Medicine

## 2015-04-29 ENCOUNTER — Ambulatory Visit: Payer: Medicare Other | Admitting: Allergy and Immunology

## 2015-04-29 ENCOUNTER — Telehealth: Payer: Self-pay | Admitting: Family Medicine

## 2015-04-29 NOTE — Telephone Encounter (Signed)
Spoke with patient and advised her to follow the instructions on the mucinex for how many she can take a day.

## 2015-04-30 NOTE — Telephone Encounter (Signed)
Flovent 110 g 2 puffs twice a day

## 2015-05-07 ENCOUNTER — Encounter: Payer: Self-pay | Admitting: Family Medicine

## 2015-05-07 ENCOUNTER — Ambulatory Visit (INDEPENDENT_AMBULATORY_CARE_PROVIDER_SITE_OTHER): Payer: Medicare Other | Admitting: Family Medicine

## 2015-05-07 VITALS — BP 100/70 | HR 90 | Temp 97.2°F | Ht 67.0 in | Wt 104.2 lb

## 2015-05-07 DIAGNOSIS — J45909 Unspecified asthma, uncomplicated: Secondary | ICD-10-CM | POA: Diagnosis not present

## 2015-05-07 DIAGNOSIS — R0789 Other chest pain: Secondary | ICD-10-CM | POA: Diagnosis not present

## 2015-05-07 DIAGNOSIS — J42 Unspecified chronic bronchitis: Secondary | ICD-10-CM

## 2015-05-07 MED ORDER — METHYLPREDNISOLONE ACETATE 80 MG/ML IJ SUSP
80.0000 mg | Freq: Once | INTRAMUSCULAR | Status: AC
  Administered 2015-05-07: 40 mg via INTRAMUSCULAR

## 2015-05-07 NOTE — Patient Instructions (Signed)
Great to see you!  I think what you are experiencing is chronic bronchitis and/or asthma symptoms  Continue symbicort but take it twice daily.   Use albuterol every 4 hours as needed   Call for a follow up appointment with Dr. Annamaria Boots.

## 2015-05-07 NOTE — Progress Notes (Signed)
   HPI  Patient presents today o discuss cough.  Patient signs over the last 6 weeks she's had persistent cough. She's been treated with amoxicillin and cefuroxime during this time. She also had a shot of steroids at our last visit and had good improvement. Tessalon Perles are also helping. Albuterol is helping She has started Symbicort once dailyover the last 2  Weeks, no major improvement yet.  She's been seen by pulmonology previously felt that she had chronic bronchitis, she also has a diagnosis of asthma. Her albuterol does seem to help which does lend itself towards reactive airway disease.  She did have an episode of left-sided achy chest pain last night. It lasted approximately 5 minutes, it was nonradiating, is not associated with increased dyspnea sweating, or faintness. It was not exertional Came and went without any aggravating or alleviating factors. She does Not have a history of heart disease   PMH: Smoking status noted ROS: Per HPI  Objective: BP 100/70 mmHg  Pulse 90  Temp(Src) 97.2 F (36.2 C) (Oral)  Ht 5\' 7"  (1.702 m)  Wt 104 lb 3.2 oz (47.265 kg)  BMI 16.32 kg/m2 Gen: NAD, alert, cooperative with exam HEENT: NCAT CV: RRR, good S1/S2, no murmur, no reproduction of pain with palp of costo-sternal border Resp: CTABL, no wheezes, non-labored Abd: SNTND, BS present, no guarding or organomegaly Ext: No edema, warm Neuro: Alert and oriented, No gross deficits  Assessment and plan:  # Chest pain Atypical, only one epsiode- none now EKG without acute findings Labs ordered Refer to cardiology to consider stress test  # Cough, Chronic bronchitis vs asthma Unclear background diagnosis but she does have a steroid and albuterol responsive cough Given 40 mg IM dep medrol today - improved with 1 3 weeks ago (lower dose as patient is frail with osteoporosis) Encouraged BID symbicort Recommended f/u with pulm for additional reccs If she is still symptomatic in 1  month and copmpliant would add spiriva or similar inhaler vs maximizing LABA/ICS with breo   Laroy Apple, MD [facility] 05/07/2015, 9:34 AM

## 2015-05-13 ENCOUNTER — Ambulatory Visit: Payer: Self-pay | Admitting: Acute Care

## 2015-05-13 ENCOUNTER — Telehealth: Payer: Self-pay | Admitting: Internal Medicine

## 2015-05-13 NOTE — Telephone Encounter (Signed)
Spoke with the pt  She is requesting appt  She believes she has bronchitis She cough cough and congestion for the past several wks and has already had 2 rounds of abx  OV with Sarah at 3:15 today

## 2015-05-18 ENCOUNTER — Ambulatory Visit (INDEPENDENT_AMBULATORY_CARE_PROVIDER_SITE_OTHER): Payer: Medicare Other | Admitting: Internal Medicine

## 2015-05-18 ENCOUNTER — Ambulatory Visit (INDEPENDENT_AMBULATORY_CARE_PROVIDER_SITE_OTHER)
Admission: RE | Admit: 2015-05-18 | Discharge: 2015-05-18 | Disposition: A | Discharge status: Home / Self Care | Payer: Medicare Other | Source: Ambulatory Visit | Attending: Internal Medicine | Admitting: Internal Medicine

## 2015-05-18 ENCOUNTER — Encounter: Payer: Self-pay | Admitting: Internal Medicine

## 2015-05-18 VITALS — BP 110/70 | HR 84 | Ht 69.0 in | Wt 104.4 lb

## 2015-05-18 DIAGNOSIS — J440 Chronic obstructive pulmonary disease with acute lower respiratory infection: Secondary | ICD-10-CM | POA: Diagnosis not present

## 2015-05-18 DIAGNOSIS — J302 Other seasonal allergic rhinitis: Secondary | ICD-10-CM | POA: Diagnosis not present

## 2015-05-18 DIAGNOSIS — J42 Unspecified chronic bronchitis: Secondary | ICD-10-CM

## 2015-05-18 DIAGNOSIS — R05 Cough: Secondary | ICD-10-CM | POA: Diagnosis not present

## 2015-05-18 MED ORDER — BENZONATATE 200 MG PO CAPS: 200.0000 mg | ORAL_CAPSULE | Freq: Three times a day (TID) | ORAL | Status: DC | PRN

## 2015-05-18 NOTE — Progress Notes (Signed)
12/28/12- 65 yoF never smoker-Dr Wong/ [facility]-last seen by [facility] 2009; seen at [facility] chest 2002; chronic cough  Has seen Dr. Lamonte Sakai here and Dr Neldon Mc in the past. Complains of allergies, asthma, chronic cough for over 20 years. Help with inhalers and cough medicines. Using rescue inhaler once or twice a day. Not using Qvar now. Particularly bad in spring and fall. [place] smells musty when it rains. She thinks in retrospect that allergy shots in the past with more than she realized. She had one episode while on allergy shots, when she had throat tightness which scared her so she quit.. Occasional cough at night. Previous PFTs are filed, but as recently as 2009 showed reversible obstructive airways disease and small airways. Singulair blamed for paresthesias. Son smokes outside the home. History of sinus surgery, vocal cord polyp. Father had allergies and asthma  02/07/13- 65 yoF never smoker-Dr Wong/ [facility]-last seen by [facility] 2009; seen at [facility] chest 2002; followed for chronic cough, allergic rhinitis  ACUTE VISIT:  Cough worsening over the past month.  Cough dry-persistant  cough worse in the past week. Losing weight attributed to poor appetite which she blames on acid indigestion despite pantoprazole/ elevated HOB. She's worried the chest x-ray suggests emphysema. Anoro blamed for chest pain. CXR 01/30/13 CLINICAL DATA: Cough.  EXAM:  CHEST 2 VIEW  COMPARISON: Chest radiograph 05/15/2012.  FINDINGS:  The cardiopericardial silhouette and mediastinal contours are within  normal limits. Hyperinflation and severe emphysema is present. No  airspace disease or pleural effusion is identified.  IMPRESSION:  Emphysema without acute cardiopulmonary disease.  Electronically Signed  By: Dereck Ligas M.D.  On: 01/30/2013 13:00  03/15/13- 65 yoF never smoker-Dr Wong/ [facility]-last seen by [facility] 2009; seen at [facility] chest 2002; followed for chronic cough, allergic rhinitis  ACUTE VISIT: Cough since 01/2013.  Saw her PCP and was given prednisone with no relief. Reports cough, ear pain, chest tightness and congestion. Had outpatient bladder surgery and has been coughing up blood since then. Sinus surgery summer 2014. Now 5 days of increased head congestion. New hemoptysis, daily, since bladder surgery 5 days ago during which she was intubated. Now on cephalexin for UTI. Notices frontal headache, ears full. Treating leg cramps with bananas and mustard a1AT level 02/07/13- WNL  MM 167. CXR 01/30/13 IMPRESSION:  Emphysema without acute cardiopulmonary disease.  Electronically Signed  By: Dereck Ligas M.D.  On: 01/30/2013 13:00  03/29/13- 65 yoF never smoker (father smoked)- followed for chronic cough, allergic rhinitis , COPD by CT Pt c/o sinus pressure, headaches, ear pain, prod cough with sometimes clear mucous  She had prednisone in early Dec for cough, then reported streak hemoptysis and had CT. Has finished Keflex and prednisone with no more hemoptysis Admits head and sinus pressure. Taking Sudafed.  Has followup appointment with ENT in [place]. Allergy profile 12/28/2012 negative with total IgE 4.5. a1AT 02/07/13- WNL MM, 167 CT chest 03/25/13 IMPRESSION:  1. No CT evidence of acute pulmonary embolism.  2. Emphysema. No acute cardiopulmonary process identified.  Electronically Signed  By: Jeannine Boga M.D.  On: 03/25/2013 13:31  05/02/13- 65 yoF never smoker (father smoked)- followed for chronic cough, allergic rhinitis , COPD by CT No antihistmines, OTC cough syrups, or sleep aids in past 3 days; Pt having continues to have chest congestion, cough,"lump in throat feeling", sore throat as well. Husband is currently sick as well; wonders if she should have test today. ENT in [facility] gave augmentin 875, 3x daily> diarrhea  after 4 doses, but she thought it had started to help. He is scheduling CT sinus.  She bought colloidal silver preparation at [employer] store, but her  accupuncturist told her not to take it. Friend recommended alternative practitioner who gives Vit C infusions.  Main c/o is globus sensation with cough, maxillary facial pressure. Husband is sick. We are deferring allergy testing.  CT maxfac 05/13/2011 IMPRESSION:  No paranasal sinuses mucosal thickening or air-fluid levels.  Midline nasal septum. Question prior surgery bilateral medial wall  maxillary sinus and right ethmoid air cells.  Original Report Authenticated By: Lahoma Crocker, M.D. QuickVue Flu swab 25/15- NEG  05/30/13- 65 yoF never smoker (father smoked)- followed for chronic cough, allergic rhinitis , COPD by CT FOLLOWS FOR:  c/o: sinus pressure and cough with clear thick mucus and ringing in ears still. Raspy throat, but much better than before. Taking cefdinir 300 mg once daily for tolerance- 3 more days. ENT said sinuses ok at [facility]. Asks depo- prefers over prednisone.  08/28/13- 65 yoF never smoker (father smoked)- followed for chronic cough, allergic rhinitis , COPD by CT(Nl a1AT) No antihistamines, OTC sleep aids in past 3 days. Pt had 2 teaspoons of Tussin DM at 11:00am this morning. Complains of facial pain, sensitive upper teeth. Told by ENT sinuses good, but Rx'd nasal mometasone and tobra. Dentist says tooth pain not from sinuses. Integrative medicine doctor in [place] did some Immuno[facility] tests but she isn't going to follow up there.Continues Qvar. 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 For allergy skin testing 08/28/13-Positive- grass, tree, weed, dust. She will decide about allergy shots. Dr Laurance Flatten could give once she is at maintenance.  10/25/13-  65 yoF never smoker (father smoked)- followed for chronic cough, allergic rhinitis , COPD by CT(Nl a1AT) FOLLOWS FOR: pt c/o teeth pain, headaches.  Pt is interested in starting allergy injections.   Went to a ENT in [place]-CT sinus showed questionable polyp in right ethmoid sinus versus mucus. Sinus culture "scant bacteria". She is using a  tobramycin nasal rinse and being treated for thrush. Chiropractor treated pinched nerve in neck Neurologist tried gabapentin and one other medication Dentist told her "teeth are not causing it"-feelings of pressure in her nose and sinuses. She wears a mouth guard at night. We had commented on obvious aware of her teeth before, and potential significance of bruxism. She is also seeing an herbalist who was treated her by putting seeds in her ear. She was interested in allergy vaccine before but did not want to have to drive here twice a week during the buildup. We discussed realistic expectations and we discussed availability of SLIT therapy for grass and ragweed.  12/25/13-  63 yoF never smoker (father smoked)- followed for chronic cough, allergic rhinitis , COPD by CT(Nl a1AT) FOLLOWS FOR: red eyes, slight nasal stuffiness in Right side. Did not start Allergy vaccine.   04/29/14- 51 yoF never smoker (father smoked)- followed for chronic cough, allergic rhinitis , COPD by CT(Nl a1AT) FOLLOWS FOR: since last visit got sick and was put on Amoxicillin-cleared up. Has had several other treatments and meds as well due to Trigeminal neuralgia. Continues to have facial pain. Has trouble with her cough as well-has noticed Tessalon pearls made her drowsy-so she changed to Delsym cough syrup. H on the right and is pending repeat on left side of face. 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 Still some cough in the mornings. PCP started Symbicort 80. Taking ensure for weight loss.  05/26/14- 63 yoF never smoker (father smoked)- followed for chronic cough, allergic rhinitis , COPD by CT(Nl a1AT) ACUTE VISIT: cough started yesterday-called MD on call([facility]) last night-suggested OV today. Pt states the mucus is clear when coughing up. Pt took plain  Mucinex yesterday and 1 Mucinex DM today. Feels congested in chest and head-having headaches as well. Drowsy/sleepy during the day. Yesterday she took to Mucinex DM tablets and one half of the Xanax for cough and anxiety than called Dr.Clance. Because of nonproductive cough and persistent chest tightness. Tessalon makes her drowsy.  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 Dentist gave her a mouth guard to help with bruxism. This all relates to her feeling of facial pain and discomfort.  05/18/2015-65 year old female never smoker (father smoked) followed for chronic cough, allergic rhinitis, COPD by CT(Nl a1AT) pt. c/o prod. cough clear in color. chest/nasal congestion X20mo. denies SOB, wheezing or chest pain/tightness. Lingering bronchitis since acute viral episode around Christmas. Nagging irritation. Mucinex not helpful. Some frontal headache helped by acupuncture "stress". Previously found that headache and jaw pain was related to dental problems.  ROS-see HPI Constitutional:   No-   weight loss, night sweats, fevers, chills, fatigue, lassitude. HEENT:   + headaches, no-difficulty swallowing, +tooth/dental problems, +sore throat,       No-  sneezing, itching, ear ache, +nasal congestion, post nasal drip,  CV:  No-   chest pain, orthopnea, PND, swelling in lower extremities, anasarca, dizziness, palpitations Resp: No-   shortness of breath with exertion or at rest.              No-   productive cough,  + non-productive cough,  No- coughing up of blood.              No-   change in color of mucus.  No- wheezing.   Skin: No-   rash or lesions. GI:   No-heartburn, indigestion,  loss of appetite GU: . MS:  No-   joint pain or swelling.   Neuro-     nothing unusual Psych:  No- change in mood or affect. + depression or anxiety.  No memory loss.  OBJ- Physical Exam General- Alert, Oriented, Affect-appropriate, Distress- none acute, +thin. Sits hunched but can  straighten. Skin- rash-none, lesions- none, excoriation- none Lymphadenopathy- none Head- atraumatic            Eyes- Gross vision intact, PERRLA, conjunctivae and secretions clear            Ears- Hearing, canals-normal            Nose- Clear, no-Septal dev, mucus, polyps, erosion, perforation             Throat- Mallampati II , mucosa+thrush , drainage- none, tonsils- atrophic.               +Much dental Repair/ teeth are worn,  Neck- flexible , trachea midline, no stridor , thyroid nl, carotid no bruit Chest - symmetrical excursion , unlabored           Heart/CV- RRR , no murmur , no gallop  , no rub, nl s1 s2                           - JVD- none , edema- none, stasis changes- none, varices- none  Lung- clear to P&A, wheeze- none, cough-none , dullness-none, rub- none. Long/thin  thorax.           Chest wall-  Abd-  Br/ Gen/ Rectal- Not done, not indicated Extrem- cyanosis- none, clubbing, none, atrophy- none, strength- nl Neuro- grossly intact to observation

## 2015-05-18 NOTE — Patient Instructions (Signed)
Script printed benzonatate perles for cough  You could try otc Delsym cough syrup  If Mucinex doesn't seem to be helping, then ok to stop.  Try using your Symbicort 2 puffs, twice daily,     Order- CXR      Dx exacerbation chronic  bronchitis

## 2015-05-20 ENCOUNTER — Encounter: Payer: Self-pay | Admitting: *Deleted

## 2015-05-21 ENCOUNTER — Telehealth: Payer: Self-pay | Admitting: Internal Medicine

## 2015-05-21 NOTE — Assessment & Plan Note (Signed)
Not much spring pollen related symptom flare yet. Discussed use of Flonase and antihistamines if needed.

## 2015-05-21 NOTE — Assessment & Plan Note (Signed)
Lingering post viral bronchitis exacerbation Plan-Delsym or benzonatate, sips and lozenges try Symbicort, chest x-ray

## 2015-05-21 NOTE — Telephone Encounter (Signed)
I spoke with patient about results and she verbalized understanding and had no questions 

## 2015-05-21 NOTE — Telephone Encounter (Signed)
Notes Recorded by Deneise Lever, MD on 05/18/2015 at 4:33 PM CXR- lungs are a little over-inflated, like a chronic asthma or bronchitis, and there is a littlebit of old scarring. No pneumnia or active proces seen. ---

## 2015-05-25 ENCOUNTER — Telehealth: Payer: Self-pay | Admitting: Family Medicine

## 2015-05-25 DIAGNOSIS — R140 Abdominal distension (gaseous): Secondary | ICD-10-CM | POA: Diagnosis not present

## 2015-05-25 DIAGNOSIS — K5909 Other constipation: Secondary | ICD-10-CM | POA: Diagnosis not present

## 2015-05-25 NOTE — Telephone Encounter (Signed)
Patient don't need referral. She made her own appt.

## 2015-05-27 ENCOUNTER — Encounter: Payer: Medicare Other | Admitting: Pharmacist

## 2015-05-28 ENCOUNTER — Encounter: Payer: Self-pay | Admitting: Family Medicine

## 2015-06-01 DIAGNOSIS — R109 Unspecified abdominal pain: Secondary | ICD-10-CM | POA: Diagnosis not present

## 2015-06-01 DIAGNOSIS — R140 Abdominal distension (gaseous): Secondary | ICD-10-CM | POA: Diagnosis not present

## 2015-06-01 DIAGNOSIS — K5900 Constipation, unspecified: Secondary | ICD-10-CM | POA: Diagnosis not present

## 2015-06-18 ENCOUNTER — Telehealth: Payer: Self-pay

## 2015-06-18 NOTE — Telephone Encounter (Signed)
-----   Message from Theron Arista, RN sent at 06/09/2015  9:09 AM EDT -----   ----- Message -----    From: Timmothy Euler, MD    Sent: 05/15/2015   4:30 PM      To: [facility] Clinical Pool A  Will you call and ask her to come in for fasting labs as we discussed. I believe she has forgotten.   Thanks!  ----- Message -----    From: SYSTEM    Sent: 05/12/2015  12:06 AM      To: Timmothy Euler, MD

## 2015-06-18 NOTE — Telephone Encounter (Signed)
Pt aware to come in for blood work.

## 2015-06-22 DIAGNOSIS — K5909 Other constipation: Secondary | ICD-10-CM | POA: Insufficient documentation

## 2015-06-22 DIAGNOSIS — K5904 Chronic idiopathic constipation: Secondary | ICD-10-CM | POA: Insufficient documentation

## 2015-06-22 DIAGNOSIS — R140 Abdominal distension (gaseous): Secondary | ICD-10-CM | POA: Diagnosis not present

## 2015-06-24 ENCOUNTER — Other Ambulatory Visit: Payer: Medicare Other

## 2015-06-24 DIAGNOSIS — R0789 Other chest pain: Secondary | ICD-10-CM | POA: Diagnosis not present

## 2015-06-24 DIAGNOSIS — J42 Unspecified chronic bronchitis: Secondary | ICD-10-CM | POA: Diagnosis not present

## 2015-06-24 LAB — CBC WITH DIFFERENTIAL/PLATELET
Basophils Absolute: 0 10*3/uL (ref 0.0–0.2)
Basos: 1 %
EOS (ABSOLUTE): 0.1 10*3/uL (ref 0.0–0.4)
EOS: 3 %
HEMATOCRIT: 37.5 % (ref 34.0–46.6)
HEMOGLOBIN: 12.5 g/dL (ref 11.1–15.9)
Immature Grans (Abs): 0 10*3/uL (ref 0.0–0.1)
Immature Granulocytes: 0 %
LYMPHS ABS: 0.9 10*3/uL (ref 0.7–3.1)
Lymphs: 30 %
MCH: 31.2 pg (ref 26.6–33.0)
MCHC: 33.3 g/dL (ref 31.5–35.7)
MCV: 94 fL (ref 79–97)
MONOCYTES: 10 %
Monocytes Absolute: 0.3 10*3/uL (ref 0.1–0.9)
NEUTROS ABS: 1.8 10*3/uL (ref 1.4–7.0)
Neutrophils: 56 %
Platelets: 231 10*3/uL (ref 150–379)
RBC: 4.01 x10E6/uL (ref 3.77–5.28)
RDW: 12.9 % (ref 12.3–15.4)
WBC: 3.2 10*3/uL — ABNORMAL LOW (ref 3.4–10.8)

## 2015-06-24 LAB — CMP14+EGFR
ALBUMIN: 4.7 g/dL (ref 3.6–4.8)
ALT: 16 IU/L (ref 0–32)
AST: 26 IU/L (ref 0–40)
Albumin/Globulin Ratio: 2.1 (ref 1.2–2.2)
Alkaline Phosphatase: 68 IU/L (ref 39–117)
BUN/Creatinine Ratio: 18 (ref 11–26)
BUN: 14 mg/dL (ref 8–27)
Bilirubin Total: 0.4 mg/dL (ref 0.0–1.2)
CALCIUM: 10.2 mg/dL (ref 8.7–10.3)
CO2: 23 mmol/L (ref 18–29)
CREATININE: 0.8 mg/dL (ref 0.57–1.00)
Chloride: 101 mmol/L (ref 96–106)
GFR, EST AFRICAN AMERICAN: 90 mL/min/{1.73_m2} (ref >59)
GFR, EST NON AFRICAN AMERICAN: 78 mL/min/{1.73_m2} (ref >59)
GLUCOSE: 78 mg/dL (ref 65–99)
Globulin, Total: 2.2 g/dL (ref 1.5–4.5)
Potassium: 4.5 mmol/L (ref 3.5–5.2)
Sodium: 139 mmol/L (ref 134–144)
TOTAL PROTEIN: 6.9 g/dL (ref 6.0–8.5)

## 2015-06-24 LAB — LIPID PANEL
CHOLESTEROL TOTAL: 201 mg/dL — AB (ref 100–199)
Chol/HDL Ratio: 2.6 ratio units (ref 0.0–4.4)
HDL: 77 mg/dL (ref >39)
LDL Calculated: 115 mg/dL — ABNORMAL HIGH (ref 0–99)
TRIGLYCERIDES: 45 mg/dL (ref 0–149)
VLDL Cholesterol Cal: 9 mg/dL (ref 5–40)

## 2015-06-26 ENCOUNTER — Encounter: Payer: Self-pay | Admitting: Cardiology

## 2015-06-26 ENCOUNTER — Ambulatory Visit (INDEPENDENT_AMBULATORY_CARE_PROVIDER_SITE_OTHER): Payer: Medicare Other | Admitting: Cardiology

## 2015-06-26 VITALS — BP 82/66 | HR 80 | Ht 69.0 in | Wt 105.0 lb

## 2015-06-26 DIAGNOSIS — R079 Chest pain, unspecified: Secondary | ICD-10-CM | POA: Diagnosis not present

## 2015-06-26 NOTE — Patient Instructions (Signed)
Your physician recommends that you schedule a follow-up appointment in: As Needed  Your physician has requested that you have an exercise tolerance test. For further information please visit [email or web address]. Please also follow instruction sheet, as given.    

## 2015-06-26 NOTE — Progress Notes (Signed)
Cardiology Office Note   Date:  06/26/2015   ID:  Kristy Garza, DOB 12-Feb-1951, MRN [id number]  PCP:  Kenn File, MD  Cardiologist:   Minus Breeding, MD   Chief Complaint  Patient presents with  . Chest Pain      History of Present Illness: Kristy Garza is a 65 y.o. female who presents for evaluation of chest pain. He has had no past cardiac history. He did have chest discomfort recently and she describes it as probably isolated episode. Mid sternal discomfort. Have any associated symptoms. She did describe any nausea or vomiting.  The patient denies any new symptoms such as chest discomfort, neck or arm discomfort. There has been no new shortness of breath, PND or orthopnea. There have been no reported palpitations, presyncope or syncope.   She has been losing weight.  She does some activities.      Past Medical History  Diagnosis Date  . Asthma   . Osteoporosis   . Acid reflux   . PONV (postoperative nausea and vomiting)   . Tinnitus   . Allergy   . Interstitial cystitis   . Trigeminal neuralgia     Atypical trigeminal neuralgia  . Arthritis     ARTHRITIS IN NECK BY DR. Michelle Nasuti    Past Surgical History  Procedure Laterality Date  . Abdominal hysterectomy  2000  . 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 Ethmoidectomy  2012  . Septoplasty  1980's  . Cystoscopy with hydrodistension and biopsy N/A 06/11/2012    Procedure: CYSTOSCOPY/BIOPSY/HYDRODISTENSION with Instillation of Pyridium and Marcaine and Kenalog;  Surgeon: Ailene Rud, MD;  Location: [facility];  Service: Urology;  Laterality: N/A;  1 hour requested for this case  BLADDER BIOPSY     Current Outpatient Prescriptions  Medication Sig Dispense Refill  . acetaminophen (TYLENOL) 500 MG tablet Take 500 mg by mouth every 6 (six) hours as needed.    [name] albuterol (PROVENTIL HFA;VENTOLIN HFA) 108 (90 BASE) MCG/ACT inhaler Inhale 2 puffs into the lungs every 4  (four) hours as needed for wheezing or shortness of breath. 1 Inhaler prn  . albuterol (PROVENTIL) (2.5 MG/3ML) 0.083% nebulizer solution Take 3 mLs (2.5 mg total) by nebulization every 6 (six) hours as needed for wheezing or shortness of breath. 150 mL 1  . aspirin-acetaminophen-caffeine (EXCEDRIN MIGRAINE) [id number] MG per tablet Take 1 tablet by mouth every 6 (six) hours as needed for headache.    [name] azelastine (ASTELIN) 0.1 % nasal spray Place 2 sprays into both nostrils 2 (two) times daily. Use in each nostril as directed 30 mL 12  . baclofen (LIORESAL) 10 MG tablet Take 5 mg by mouth 2 (two) times daily.    . benzonatate (TESSALON) 200 MG capsule Take 1 capsule (200 mg total) by mouth 3 (three) times daily as needed for cough. 30 capsule 1  . calcium citrate-vitamin D (CITRACAL+D) 315-200 MG-UNIT per tablet Take 1 tablet by mouth.    . Calcium Glycerophosphate (PRELIEF PO) Take 1 tablet by mouth as needed (prior to acid containing foods).    . cefUROXime (CEFTIN) 250 MG tablet TAKE 1 TABLET (250 MG TOTAL) BY MOUTH 2 (TWO) TIMES DAILY WITH A MEAL.  0  . Cholecalciferol (VITAMIN D PO) Take 4,000 Units by mouth daily.     . diclofenac sodium (VOLTAREN) 1 % GEL Apply 1 g topically.     [name] ELMIRON 100 MG capsule  Take 100 mg by mouth 2 (two) times daily.     . fexofenadine (ALLEGRA) 60 MG tablet Take 60 mg by mouth 2 (two) times daily.    . fluticasone (FLONASE) 50 MCG/ACT nasal spray Place 2 sprays into both nostrils daily. 16 g 11  . gabapentin (NEURONTIN) 100 MG capsule Take 1 capsule (100 mg total) by mouth at bedtime. 90 capsule 0  . guaiFENesin (MUCINEX) 600 MG 12 hr tablet Take 600 mg by mouth 2 (two) times daily.    [name] lidocaine (XYLOCAINE) 2 % jelly Apply topically as needed.    . Linaclotide (LINZESS) 290 MCG CAPS capsule TAKE 1 CAPSULE (290 MCG TOTAL) BY MOUTH DAILY. 30 capsule 5  . Magnesium Oxide 400 (240 MG) MG TABS Take 1 tablet by mouth daily.    . Multiple Vitamin (MULTIVITAMIN)  tablet Take 1 tablet by mouth daily.    . Nutritional Supplements (DHEA PO) Take 5 mg by mouth daily.     . Omega-3 Fatty Acids (FISH OIL) 1000 MG CAPS Take 1 capsule by mouth 2 (two) times daily.     . pantoprazole (PROTONIX) 40 MG tablet Take 40 mg by mouth daily.    . SYMBICORT 80-4.5 MCG/ACT inhaler INHALE 2 PUFFS INTO THE LUNGS 2 (TWO) TIMES DAILY. 10.2 Inhaler 2  . traMADol (ULTRAM) 50 MG tablet Take 1 tablet (50 mg total) by mouth every 6 (six) hours as needed. 30 tablet 0   Current Facility-Administered Medications  Medication Dose Route Frequency Provider Last Rate Last Dose  . cyanocobalamin ((VITAMIN B-12)) injection 1,000 mcg  1,000 mcg Intramuscular Q30 days [name], MD   1,000 mcg at 06/16/14 1324   Facility-Administered Medications Ordered in Other Visits  Medication Dose Route Frequency Provider Last Rate Last Dose  . bupivacaine (MARCAINE) 0.5 % 10 mL, triamcinolone acetonide (KENALOG-40) 40 mg injection   Subcutaneous Once Carolan Clines, MD      . bupivacaine (MARCAINE) 0.5 % 15 mL, phenazopyridine (PYRIDIUM) 400 mg bladder mixture   Bladder Instillation Once Carolan Clines, MD        Allergies:   Augmentin; Avelox; Biaxin; Cedax; Ciprofloxacin; Clindamycin/lincomycin; Gabapentin; Levofloxacin; Lyrica; Singulair; and Sulfonamide derivatives    Social History:  The patient  reports that she has never smoked. She has never used smokeless tobacco. She reports that she does not drink alcohol or use illicit drugs.   Family History:  The patient's family history includes Allergies in her father, sister, and sister; Arthritis in her mother; Cancer in her father; Hyperlipidemia in her mother.    ROS:  Please see the history of present illness.   Otherwise, review of systems are positive for none.   All other systems are reviewed and negative.    PHYSICAL EXAM: VS:  BP 82/66 mmHg  Pulse 80  Ht 5\' 9"  (1.753 m)  Wt 105 lb (47.628 kg)  BMI 15.50 kg/m2 , BMI Body  mass index is 15.5 kg/(m^2). GENERAL:  Well appearing but very thin HEENT:  Pupils equal round and reactive, fundi not visualized, oral mucosa unremarkable NECK:  No jugular venous distention, waveform within normal limits, carotid upstroke brisk and symmetric, no bruits, no thyromegaly LYMPHATICS:  No cervical, inguinal adenopathy LUNGS:  Clear to auscultation bilaterally BACK:  No CVA tenderness CHEST:  Unremarkable HEART:  PMI not displaced or sustained,S1 and S2 within normal limits, no S3, no S4, no clicks, no rubs, no murmurs ABD:  Flat, positive bowel sounds normal in frequency in pitch, no bruits,  no rebound, no guarding, no midline pulsatile mass, no hepatomegaly, no splenomegaly EXT:  2 plus pulses throughout, no edema, no cyanosis no clubbing SKIN:  No rashes no nodules NEURO:  Cranial nerves II through XII grossly intact, motor grossly intact throughout PSYCH:  Cognitively intact, oriented to person place and time    EKG:  EKG is not ordered today. The ekg ordered today demonstrates    Recent Labs: 04/08/2015: TSH 0.377* 06/24/2015: ALT 16; BUN 14; Creatinine, Ser 0.80; Platelets 231; Potassium 4.5; Sodium 139    Lipid Panel    Component Value Date/Time   CHOL 201* 06/24/2015 0927   TRIG 45 06/24/2015 0927   HDL 77 06/24/2015 0927   CHOLHDL 2.6 06/24/2015 0927   LDLCALC 115* 06/24/2015 0927   LDLDIRECT 99 02/14/2014 1047      Wt Readings from Last 3 Encounters:  06/26/15 105 lb (47.628 kg)  05/18/15 104 lb 6.4 oz (47.356 kg)  05/07/15 104 lb 3.2 oz (47.265 kg)      Other studies Reviewed: Additional studies/ records that were reviewed today include: Office records. Review of the above records demonstrates:  Please see elsewhere in the note.     ASSESSMENT AND PLAN:  Chest pain:  Her pain is atypical. I think the pretest probability of obstructive coronary disease is somewhat low. I will bring the patient back for a POET (Plain Old Exercise Test). This  will allow me to screen for obstructive coronary disease, risk stratify and very importantly provide a prescription for exercise.  Underweight:  We discussed healthy ways to increase her calories.   Of note the thyroid was mildly reduced but this is followed by her primary provider do not think it is any of hyperthyroidism  Hypotension:  We talked about salt and fluid restriction.     Current medicines are reviewed at length with the patient today.  The patient does not have concerns regarding medicines.  The following changes have been made:  no change  Labs/ tests ordered today include:   Orders Placed This Encounter  Procedures  . Exercise Tolerance Test     Disposition:   FU with me as needed.    Signed, Minus Breeding, MD  06/26/2015 5:28 PM    [facility]

## 2015-07-03 ENCOUNTER — Telehealth: Payer: Self-pay | Admitting: Family Medicine

## 2015-07-03 NOTE — Telephone Encounter (Signed)
Call given to medical records

## 2015-07-06 NOTE — Telephone Encounter (Signed)
Labs and EKG mailed

## 2015-07-11 ENCOUNTER — Encounter: Payer: Self-pay | Admitting: Family Medicine

## 2015-07-11 ENCOUNTER — Ambulatory Visit (INDEPENDENT_AMBULATORY_CARE_PROVIDER_SITE_OTHER): Payer: Medicare Other | Admitting: Family Medicine

## 2015-07-11 VITALS — BP 107/67 | HR 78 | Temp 97.5°F | Ht 69.0 in | Wt 102.6 lb

## 2015-07-11 DIAGNOSIS — K581 Irritable bowel syndrome with constipation: Secondary | ICD-10-CM | POA: Insufficient documentation

## 2015-07-11 DIAGNOSIS — K589 Irritable bowel syndrome without diarrhea: Secondary | ICD-10-CM | POA: Diagnosis not present

## 2015-07-11 DIAGNOSIS — F411 Generalized anxiety disorder: Secondary | ICD-10-CM

## 2015-07-11 MED ORDER — ESCITALOPRAM OXALATE 10 MG PO TABS: 10.0000 mg | ORAL_TABLET | Freq: Every day | ORAL | Status: DC

## 2015-07-11 NOTE — Progress Notes (Signed)
BP 107/67 mmHg  Pulse 78  Temp(Src) 97.5 F (36.4 C) (Oral)  Ht '5\' 9"'$  (1.753 m)  Wt 102 lb 9.6 oz (46.539 kg)  BMI 15.14 kg/m2   Subjective:    Patient ID: Kristy Garza Kitchen, female    DOB: 04-10-1950, 65 y.o.   MRN: [id number]  HPI: Kristy Garza is a 65 y.o. female presenting on 07/11/2015 for Thinks she may have a bacterial infection in her gut   HPI Irritable bowels and abdominal complaints Patient has had chronic irritable bowel issues and GERD and comes in today complaining of persistent abdominal complaints and intermittent constipation and nausea. She is also been losing weight and does not eat as much as she should because of these abdominal complaints. She also does admit that she gets a lot of anxiety and because of her anxiety issues renal book on anxiety and abdominal issues and found something on [place] and now thinks she has candida in her gut and wants to be tested for it. Her anxiety does control a lot of her and her symptoms and her lifestyle. She feels like she is always anxious. She denies any suicidal ideations or thoughts of hurting herself. She denies any major depression. Sometimes her anxiety even keeps her up at night.  Relevant past medical, surgical, family and social history reviewed and updated as indicated. Interim medical history since our last visit reviewed. Allergies and medications reviewed and updated.  Review of Systems  Constitutional: Negative for fever and chills.  HENT: Negative for congestion, ear discharge and ear pain.   Eyes: Negative for redness and visual disturbance.  Respiratory: Negative for chest tightness and shortness of breath.   Cardiovascular: Negative for chest pain and leg swelling.  Gastrointestinal: Positive for nausea, abdominal pain and constipation. Negative for vomiting, diarrhea, blood in stool, abdominal distention, anal bleeding and rectal pain.  Genitourinary: Negative for dysuria and difficulty urinating.    Musculoskeletal: Negative for back pain and gait problem.  Skin: Negative for rash.  Neurological: Negative for light-headedness and headaches.  Psychiatric/Behavioral: Positive for sleep disturbance. Negative for suicidal ideas, behavioral problems, self-injury, dysphoric mood and agitation. The patient is nervous/anxious.   All other systems reviewed and are negative.   Per HPI unless specifically indicated above     Medication List       This list is accurate as of: 07/11/15 11:05 AM.  Always use your most recent med list.               acetaminophen 500 MG tablet  Commonly known as:  TYLENOL  Take 500 mg by mouth every 6 (six) hours as needed.     albuterol (2.5 MG/3ML) 0.083% nebulizer solution  Commonly known as:  PROVENTIL  Take 3 mLs (2.5 mg total) by nebulization every 6 (six) hours as needed for wheezing or shortness of breath.     albuterol 108 (90 Base) MCG/ACT inhaler  Commonly known as:  PROVENTIL HFA;VENTOLIN HFA  Inhale 2 puffs into the lungs every 4 (four) hours as needed for wheezing or shortness of breath.     aspirin-acetaminophen-caffeine 250-250-65 MG tablet  Commonly known as:  EXCEDRIN MIGRAINE  Take 1 tablet by mouth every 6 (six) hours as needed for headache.     azelastine 0.1 % nasal spray  Commonly known as:  ASTELIN  Place 2 sprays into both nostrils 2 (two) times daily. Use in each nostril as directed     benzonatate 200 MG capsule  Commonly  known as:  TESSALON  Take 1 capsule (200 mg total) by mouth 3 (three) times daily as needed for cough.     calcium citrate-vitamin D 315-200 MG-UNIT tablet  Commonly known as:  CITRACAL+D  Take 1 tablet by mouth.     DHEA PO  Take 5 mg by mouth daily.     diclofenac sodium 1 % Gel  Commonly known as:  VOLTAREN  Apply 1 g topically.     ELMIRON 100 MG capsule  Generic drug:  pentosan polysulfate  Take 100 mg by mouth 2 (two) times daily.     escitalopram 10 MG tablet  Commonly known as:   LEXAPRO  Take 1 tablet (10 mg total) by mouth daily.     fexofenadine 60 MG tablet  Commonly known as:  ALLEGRA  Take 60 mg by mouth 2 (two) times daily.     fluticasone 50 MCG/ACT nasal spray  Commonly known as:  FLONASE  Place 2 sprays into both nostrils daily.     guaiFENesin 600 MG 12 hr tablet  Commonly known as:  MUCINEX  Take 600 mg by mouth 2 (two) times daily.     lidocaine 2 % jelly  Commonly known as:  XYLOCAINE  Apply topically as needed.     linaclotide 290 MCG Caps capsule  Commonly known as:  LINZESS  TAKE 1 CAPSULE (290 MCG TOTAL) BY MOUTH DAILY.     Magnesium Oxide 400 (240 Mg) MG Tabs  Take 1 tablet by mouth daily.     multivitamin tablet  Take 1 tablet by mouth daily.     pantoprazole 40 MG tablet  Commonly known as:  PROTONIX  Take 40 mg by mouth daily.     PRELIEF PO  Take 1 tablet by mouth as needed (prior to acid containing foods).     SYMBICORT 80-4.5 MCG/ACT inhaler  Generic drug:  budesonide-formoterol  INHALE 2 PUFFS INTO THE LUNGS 2 (TWO) TIMES DAILY.     traMADol 50 MG tablet  Commonly known as:  ULTRAM  Take 1 tablet (50 mg total) by mouth every 6 (six) hours as needed.     VITAMIN D PO  Take 4,000 Units by mouth daily.           Objective:    BP 107/67 mmHg  Pulse 78  Temp(Src) 97.5 F (36.4 C) (Oral)  Ht 5\' 9"  (1.753 m)  Wt 102 lb 9.6 oz (46.539 kg)  BMI 15.14 kg/m2  Wt Readings from Last 3 Encounters:  07/11/15 102 lb 9.6 oz (46.539 kg)  06/26/15 105 lb (47.628 kg)  05/18/15 104 lb 6.4 oz (47.356 kg)    Physical Exam  Constitutional: She is oriented to person, place, and time. She appears well-developed and well-nourished. No distress.  Eyes: Conjunctivae and EOM are normal. Pupils are equal, round, and reactive to light.  Neck: Neck supple. No thyromegaly present.  Cardiovascular: Normal rate, regular rhythm, normal heart sounds and intact distal pulses.   No murmur heard. Pulmonary/Chest: Effort normal and  breath sounds normal. No respiratory distress. She has no wheezes.  Abdominal: Soft. Bowel sounds are normal. She exhibits no distension and no mass. There is no hepatosplenomegaly. There is tenderness (Lower abdominal bilateral tenderness) in the right lower quadrant and left lower quadrant. There is no rigidity, no rebound, no guarding and no CVA tenderness. No hernia.  Musculoskeletal: Normal range of motion. She exhibits no edema or tenderness.  Lymphadenopathy:    She has no cervical  adenopathy.  Neurological: She is alert and oriented to person, place, and time. Coordination normal.  Skin: Skin is warm and dry. No rash noted. She is not diaphoretic.  Psychiatric: Her behavior is normal. Judgment and thought content normal. Her mood appears anxious. Her affect is labile. She expresses no suicidal ideation. She expresses no suicidal plans.  Nursing note and vitals reviewed.   Results for orders placed or performed in visit on 05/07/15  Lipid panel  Result Value Ref Range   Cholesterol, Total 201 (H) 100 - 199 mg/dL   Triglycerides 45 0 - 149 mg/dL   HDL 77 >39 mg/dL   VLDL Cholesterol Cal 9 5 - 40 mg/dL   LDL Calculated 115 (H) 0 - 99 mg/dL   Chol/HDL Ratio 2.6 0.0 - 4.4 ratio units  CBC with Differential  Result Value Ref Range   WBC 3.2 (L) 3.4 - 10.8 x10E3/uL   RBC 4.01 3.77 - 5.28 x10E6/uL   Hemoglobin 12.5 11.1 - 15.9 g/dL   Hematocrit 37.5 34.0 - 46.6 %   MCV 94 79 - 97 fL   MCH 31.2 26.6 - 33.0 pg   MCHC 33.3 31.5 - 35.7 g/dL   RDW 12.9 12.3 - 15.4 %   Platelets 231 150 - 379 x10E3/uL   Neutrophils 56 %   Lymphs 30 %   Monocytes 10 %   Eos 3 %   Basos 1 %   Neutrophils Absolute 1.8 1.4 - 7.0 x10E3/uL   Lymphocytes Absolute 0.9 0.7 - 3.1 x10E3/uL   Monocytes Absolute 0.3 0.1 - 0.9 x10E3/uL   EOS (ABSOLUTE) 0.1 0.0 - 0.4 x10E3/uL   Basophils Absolute 0.0 0.0 - 0.2 x10E3/uL   Immature Granulocytes 0 %   Immature Grans (Abs) 0.0 0.0 - 0.1 x10E3/uL  CMP14+EGFR    Result Value Ref Range   Glucose 78 65 - 99 mg/dL   BUN 14 8 - 27 mg/dL   Creatinine, Ser 0.80 0.57 - 1.00 mg/dL   GFR calc non Af Amer 78 >59 mL/min/1.73   GFR calc Af Amer 90 >59 mL/min/1.73   BUN/Creatinine Ratio 18 11 - 26   Sodium 139 134 - 144 mmol/L   Potassium 4.5 3.5 - 5.2 mmol/L   Chloride 101 96 - 106 mmol/L   CO2 23 18 - 29 mmol/L   Calcium 10.2 8.7 - 10.3 mg/dL   Total Protein 6.9 6.0 - 8.5 g/dL   Albumin 4.7 3.6 - 4.8 g/dL   Globulin, Total 2.2 1.5 - 4.5 g/dL   Albumin/Globulin Ratio 2.1 1.2 - 2.2   Bilirubin Total 0.4 0.0 - 1.2 mg/dL   Alkaline Phosphatase 68 39 - 117 IU/L   AST 26 0 - 40 IU/L   ALT 16 0 - 32 IU/L      Assessment & Plan:   Problem List Items Addressed This Visit      Digestive   IBS (irritable bowel syndrome)   Relevant Orders   Candida Albicans A(IGG,IGA,IGM)     Other   Generalized anxiety disorder - Primary   Relevant Medications   escitalopram (LEXAPRO) 10 MG tablet       Follow up plan: Return in about 4 weeks (around 08/08/2015), or if symptoms worsen or fail to improve, for Recheck anxiety and Lexapro.  Counseling provided for all of the vaccine components Orders Placed This Encounter  Procedures  . Candida Albicans A(IGG,IGA,IGM)    Caryl Pina, MD [facility] 07/11/2015, 11:05 AM

## 2015-07-14 LAB — CANDIDA ANTIBODIES IGA

## 2015-07-14 LAB — CANDIDA ANTIBODIES IGG: Candida Antibodies IgG: <30 U/mL (ref 0–29)

## 2015-07-14 LAB — CANDIDA ANTIBODIES IGM: Candida Antibodies IgM: 12 U/mL — ABNORMAL HIGH (ref 0–9)

## 2015-07-14 LAB — SPECIMEN STATUS REPORT

## 2015-07-15 ENCOUNTER — Telehealth: Payer: Self-pay | Admitting: Family Medicine

## 2015-07-15 DIAGNOSIS — R1032 Left lower quadrant pain: Secondary | ICD-10-CM | POA: Diagnosis not present

## 2015-07-15 DIAGNOSIS — R140 Abdominal distension (gaseous): Secondary | ICD-10-CM | POA: Diagnosis not present

## 2015-07-15 DIAGNOSIS — K5904 Chronic idiopathic constipation: Secondary | ICD-10-CM | POA: Diagnosis not present

## 2015-07-15 DIAGNOSIS — R1031 Right lower quadrant pain: Secondary | ICD-10-CM | POA: Diagnosis not present

## 2015-07-15 NOTE — Telephone Encounter (Signed)
Labs not back. Patient aware we will call when results are back.

## 2015-07-16 ENCOUNTER — Telehealth (HOSPITAL_COMMUNITY): Payer: Self-pay

## 2015-07-16 NOTE — Telephone Encounter (Signed)
Encounter complete. 

## 2015-07-17 ENCOUNTER — Other Ambulatory Visit: Payer: Self-pay | Admitting: Family Medicine

## 2015-07-21 ENCOUNTER — Ambulatory Visit: Payer: Medicare Other | Admitting: Allergy and Immunology

## 2015-07-21 ENCOUNTER — Ambulatory Visit (HOSPITAL_COMMUNITY)
Admission: RE | Admit: 2015-07-21 | Discharge: 2015-07-21 | Disposition: A | Discharge status: Home / Self Care | Payer: Medicare Other | Source: Ambulatory Visit | Attending: Cardiovascular Disease | Admitting: Cardiovascular Disease

## 2015-07-21 DIAGNOSIS — R079 Chest pain, unspecified: Secondary | ICD-10-CM | POA: Diagnosis not present

## 2015-07-21 DIAGNOSIS — R9439 Abnormal result of other cardiovascular function study: Secondary | ICD-10-CM | POA: Diagnosis not present

## 2015-07-21 LAB — EXERCISE TOLERANCE TEST
CHL CUP MPHR: 156 {beats}/min
CHL CUP RESTING HR STRESS: 69 {beats}/min
CHL RATE OF PERCEIVED EXERTION: 17
CSEPEW: 10.1 METS
CSEPHR: 106 %
Exercise duration (min): 8 min
Exercise duration (sec): 6 s
Peak HR: 166 {beats}/min

## 2015-07-22 ENCOUNTER — Telehealth: Payer: Self-pay | Admitting: *Deleted

## 2015-07-22 DIAGNOSIS — R072 Precordial pain: Secondary | ICD-10-CM

## 2015-07-22 NOTE — Telephone Encounter (Signed)
spoke with pt about her stress test, Lexiscan myoview was order and send to scheduler

## 2015-07-24 ENCOUNTER — Telehealth (HOSPITAL_COMMUNITY): Payer: Self-pay | Admitting: *Deleted

## 2015-07-24 NOTE — Telephone Encounter (Signed)
Left message for patient to call so we can schedule lexiscan myoview orderd by Dr. Percival Spanish

## 2015-07-25 ENCOUNTER — Encounter: Payer: Self-pay | Admitting: Family Medicine

## 2015-07-25 ENCOUNTER — Ambulatory Visit (INDEPENDENT_AMBULATORY_CARE_PROVIDER_SITE_OTHER): Payer: Medicare Other | Admitting: Family Medicine

## 2015-07-25 VITALS — BP 107/74 | HR 76 | Temp 97.1°F | Ht 69.0 in | Wt 105.4 lb

## 2015-07-25 DIAGNOSIS — B377 Candidal sepsis: Secondary | ICD-10-CM

## 2015-07-25 DIAGNOSIS — J302 Other seasonal allergic rhinitis: Secondary | ICD-10-CM

## 2015-07-25 DIAGNOSIS — F411 Generalized anxiety disorder: Secondary | ICD-10-CM | POA: Diagnosis not present

## 2015-07-25 DIAGNOSIS — K589 Irritable bowel syndrome without diarrhea: Secondary | ICD-10-CM | POA: Diagnosis not present

## 2015-07-25 MED ORDER — FLUCONAZOLE 100 MG PO TABS: 100.0000 mg | ORAL_TABLET | Freq: Every day | ORAL | Status: DC

## 2015-07-25 NOTE — Progress Notes (Signed)
Subjective:  Patient ID: Kristy Garza, female    DOB: [birth year]  Age: 65 y.o. MRN: [id number]  CC: Abdominal Pain   HPI CHERESSE SCHNEPP presents for fatigue, depression, abd pain. Craving for sweets, rectal itch. Had steroids in Feb as well as antibiotic. Dampness in bedroom. IBS with constipation. Read up on these sx and feels she has candidiasis.   Didn't try the anxiety med recommended by Dr. Warrick Parisian. Concerned for safety of the med. . Left arm pain overnight last night. Has had heartburn, indigestion. Concerned that these pains although she recognizes them as GI, she is concerned for her heart. No chest pain with the left arm pain. No nausea, diaphoresis. The arm pain is not exertional. It is an ache. It is constantwith waxing and waning intensity over last several hours. Mostly mild.    History Sayra has a past medical history of Asthma; Osteoporosis; Acid reflux; PONV (postoperative nausea and vomiting); Tinnitus; Allergy; Interstitial cystitis; Trigeminal neuralgia; and Arthritis.   She has past surgical history that includes Abdominal hysterectomy (2000); vocal cord surgery  [id number]); Ethmoidectomy (2012); Septoplasty (1980's); and Cystoscopy with hydrodistension and biopsy (N/A, 06/11/2012).   Her family history includes Allergies in her father, sister, and sister; Arthritis in her mother; Cancer in her father; Hyperlipidemia in her mother.She reports that she has never smoked. She has never used smokeless tobacco. She reports that she does not drink alcohol or use illicit drugs.    ROS Review of Systems  Constitutional: Negative for fever, activity change and appetite change.  HENT: Positive for congestion, rhinorrhea and sneezing. Negative for sore throat.   Eyes: Negative for visual disturbance.  Respiratory: Positive for cough. Negative for shortness of breath.   Cardiovascular: Negative for palpitations.  Gastrointestinal: Positive for abdominal pain, constipation,  abdominal distention and rectal pain (actually described as an itch). Negative for nausea and diarrhea.  Genitourinary: Negative for dysuria.  Musculoskeletal: Negative for myalgias and arthralgias.  Psychiatric/Behavioral: Positive for dysphoric mood and agitation. Negative for confusion. The patient is nervous/anxious.     Objective:  BP 107/74 mmHg  Pulse 76  Temp(Src) 97.1 F (36.2 C) (Oral)  Ht 5\' 9"  (1.753 m)  Wt 105 lb 6.4 oz (47.809 kg)  BMI 15.56 kg/m2  SpO2 98%  BP Readings from Last 3 Encounters:  07/25/15 107/74  07/11/15 107/67  06/26/15 82/66    Wt Readings from Last 3 Encounters:  07/25/15 105 lb 6.4 oz (47.809 kg)  07/11/15 102 lb 9.6 oz (46.539 kg)  06/26/15 105 lb (47.628 kg)     Physical Exam  Constitutional: She is oriented to person, place, and time. She appears well-developed and well-nourished.  HENT:  Head: Normocephalic and atraumatic.  Cardiovascular: Normal rate and regular rhythm.   No murmur heard. Pulmonary/Chest: Effort normal and breath sounds normal.  Abdominal: Soft. Bowel sounds are normal. She exhibits no mass. There is tenderness. There is no rebound and no guarding.  Neurological: She is alert and oriented to person, place, and time.  Skin: Skin is warm and dry.  Psychiatric: She has a normal mood and affect. Her behavior is normal.     Lab Results  Component Value Date   WBC 3.2* 06/24/2015   HGB 11.6* 06/16/2014   HCT 37.5 06/24/2015   PLT 231 06/24/2015   GLUCOSE 78 06/24/2015   CHOL 201* 06/24/2015   TRIG 45 06/24/2015   HDL 77 06/24/2015   LDLDIRECT 99 02/14/2014   LDLCALC 115* 06/24/2015  ALT 16 06/24/2015   AST 26 06/24/2015   NA 139 06/24/2015   K 4.5 06/24/2015   CL 101 06/24/2015   CREATININE 0.80 06/24/2015   BUN 14 06/24/2015   CO2 23 06/24/2015   TSH 0.377* 04/08/2015    No results found.  Assessment & Plan:   Mandisa was seen today for abdominal pain.  Diagnoses and all orders for this  visit:  Disseminated candidiasis (HCC)  IBS (irritable bowel syndrome)  Seasonal allergic rhinitis  Generalized anxiety disorder  Other orders -     fluconazole (DIFLUCAN) 100 MG tablet; Take 1 tablet (100 mg total) by mouth daily.    Encouraged pt to go ahead with anxiety med.  Use your tessalon (benzonatate ) supply as needed for cough.  Allegra, zyrtec and xyzal are best for allergy symptoms.  Take all of the fluconazole for candidiasis  Follow through with stress test recommended by yuor heart doctor due to your left arm pain   I have discontinued Ms. Santoro's linaclotide. I am also having her start on fluconazole. Additionally, I am having her maintain her pantoprazole, lidocaine, albuterol, guaiFENesin, Cholecalciferol (VITAMIN D PO), Nutritional Supplements (DHEA PO), ELMIRON, calcium citrate-vitamin D, Calcium Glycerophosphate (PRELIEF PO), multivitamin, albuterol, aspirin-acetaminophen-caffeine, fexofenadine, Magnesium Oxide, traMADol, diclofenac sodium, fluticasone, azelastine, acetaminophen, SYMBICORT, benzonatate, escitalopram, and LINZESS. We will continue to administer cyanocobalamin.  Meds ordered this encounter  Medications  . fluconazole (DIFLUCAN) 100 MG tablet    Sig: Take 1 tablet (100 mg total) by mouth daily.    Dispense:  15 tablet    Refill:  0     Follow-up: Return in about 1 month (around 08/24/2015) for Dr. Warrick Parisian.  [name], M.D.

## 2015-07-25 NOTE — Patient Instructions (Addendum)
Use your tessalon (benzonatate ) supply as needed for cough.  Allegra, zyrtec and xyzal are best for allergy symptoms.  Take all of the fluconazole for candidiasis  Follow through with stress test recommended by yuor heart doctor due to your left arm pain

## 2015-07-27 ENCOUNTER — Encounter: Payer: Self-pay | Admitting: Pharmacist

## 2015-07-27 ENCOUNTER — Ambulatory Visit (INDEPENDENT_AMBULATORY_CARE_PROVIDER_SITE_OTHER): Payer: Medicare Other

## 2015-07-27 ENCOUNTER — Ambulatory Visit (INDEPENDENT_AMBULATORY_CARE_PROVIDER_SITE_OTHER): Payer: Medicare Other | Admitting: Pharmacist

## 2015-07-27 VITALS — BP 110/68 | HR 75 | Ht 69.0 in | Wt 104.0 lb

## 2015-07-27 DIAGNOSIS — Z0000 Encounter for general adult medical examination without abnormal findings: Secondary | ICD-10-CM

## 2015-07-27 DIAGNOSIS — M8000XD Age-related osteoporosis with current pathological fracture, unspecified site, subsequent encounter for fracture with routine healing: Secondary | ICD-10-CM | POA: Diagnosis not present

## 2015-07-27 DIAGNOSIS — M810 Age-related osteoporosis without current pathological fracture: Secondary | ICD-10-CM | POA: Diagnosis not present

## 2015-07-27 NOTE — Patient Instructions (Addendum)
Kristy Garza , Thank you for taking time to come for your Medicare Wellness Visit. I appreciate your ongoing commitment to your health goals. Please review the following plan we discussed and let me know if I can assist you in the future.   These are the goals we discussed: Continue to go to [employer] - great exercise for bones.  Add weights / weight bearing exercise to strengthen bones.  Restart calcium citrate - take 2 tablets daily Keep appointment with allergist for food allergy testing.  Limit dairy.  Try to switch from regular Boost to fruit flavored Boost or even better make smoothies from recipes I gave you substituting with either almond or soy milk.  Try to decrease use of Excedrin and eventually discontinue - I think you might be getting rebound headaches from using Excedrin. Ok to drink Camomile tea for anxiety / stress.  I would recommend not taking Stress Vitamin as it has many duplicates of vitamins you are already taking.   This is a list of the screening recommended for you and due dates:  Health Maintenance  Topic Date Due  .  Hepatitis C: One time screening is recommended by Center for Disease Control  (CDC) for  adults born from [birth year] through 1965.   Checking today  . HIV Screening  postpone  . Pap Smear  02/02/2016 - appointment made today (see above)  . Mammogram  08/31/2015  . Flu Shot  10/27/2015  . Colon Cancer Screening  03/28/2017  . DEXA scan (bone density measurement)  07/26/2017  . Tetanus Vaccine  05/21/2020  . Shingles Vaccine  Completed  *Topic was postponed. The date shown is not the original due date.    Health Maintenance, Female Adopting a healthy lifestyle and getting preventive care can go a long way to promote health and wellness. Talk with your health care provider about what schedule of regular examinations is right for you. This is a good chance for you to check in with your provider about disease prevention and staying healthy. In  between checkups, there are plenty of things you can do on your own. Experts have done a lot of research about which lifestyle changes and preventive measures are most likely to keep you healthy. Ask your health care provider for more information. WEIGHT AND DIET  Eat a healthy diet  Be sure to include plenty of vegetables, fruits, low-fat dairy products, and lean protein.  Do not eat a lot of foods high in solid fats, added sugars, or salt.  Get regular exercise. This is one of the most important things you can do for your health.  Most adults should exercise for at least 150 minutes each week. The exercise should increase your heart rate and make you sweat (moderate-intensity exercise).  Most adults should also do strengthening exercises at least twice a week. This is in addition to the moderate-intensity exercise.  Maintain a healthy weight  Body mass index (BMI) is a measurement that can be used to identify possible weight problems. It estimates body fat based on height and weight. Your health care provider can help determine your BMI and help you achieve or maintain a healthy weight.  For females 47 years of age and older:   A BMI below 18.5 is considered underweight.  A BMI of 18.5 to 24.9 is normal.  A BMI of 25 to 29.9 is considered overweight.  A BMI of 30 and above is considered obese.  Watch levels of cholesterol  and blood lipids  You should start having your blood tested for lipids and cholesterol at 65 years of age, then have this test every 5 years.  You may need to have your cholesterol levels checked more often if:  Your lipid or cholesterol levels are high.  You are older than 65 years of age.  You are at high risk for heart disease.  CANCER SCREENING   Lung Cancer  Lung cancer screening is recommended for adults 14-60 years old who are at high risk for lung cancer because of a history of smoking.  A yearly low-dose CT scan of the lungs is recommended  for people who:  Currently smoke.  Have quit within the past 15 years.  Have at least a 30-pack-year history of smoking. A pack year is smoking an average of one pack of cigarettes a day for 1 year.  Yearly screening should continue until it has been 15 years since you quit.  Yearly screening should stop if you develop a health problem that would prevent you from having lung cancer treatment.  Breast Cancer  Practice breast self-awareness. This means understanding how your breasts normally appear and feel.  It also means doing regular breast self-exams. Let your health care provider know about any changes, no matter how small.  If you are in your 20s or 30s, you should have a clinical breast exam (CBE) by a health care provider every 1-3 years as part of a regular health exam.  If you are 72 or older, have a CBE every year. Also consider having a breast X-ray (mammogram) every year.  If you have a family history of breast cancer, talk to your health care provider about genetic screening.  If you are at high risk for breast cancer, talk to your health care provider about having an MRI and a mammogram every year.  Breast cancer gene (BRCA) assessment is recommended for women who have family members with BRCA-related cancers. BRCA-related cancers include:  Breast.  Ovarian.  Tubal.  Peritoneal cancers.  Results of the assessment will determine the need for genetic counseling and BRCA1 and BRCA2 testing. Cervical Cancer Your health care provider may recommend that you be screened regularly for cancer of the pelvic organs (ovaries, uterus, and vagina). This screening involves a pelvic examination, including checking for microscopic changes to the surface of your cervix (Pap test). You may be encouraged to have this screening done every 3 years, beginning at age 69.  For women ages 55-65, health care providers may recommend pelvic exams and Pap testing every 3 years, or they may  recommend the Pap and pelvic exam, combined with testing for human papilloma virus (HPV), every 5 years. Some types of HPV increase your risk of cervical cancer. Testing for HPV may also be done on women of any age with unclear Pap test results.  Other health care providers may not recommend any screening for nonpregnant women who are considered low risk for pelvic cancer and who do not have symptoms. Ask your health care provider if a screening pelvic exam is right for you.  If you have had past treatment for cervical cancer or a condition that could lead to cancer, you need Pap tests and screening for cancer for at least 20 years after your treatment. If Pap tests have been discontinued, your risk factors (such as having a new sexual partner) need to be reassessed to determine if screening should resume. Some women have medical problems that increase the chance of  getting cervical cancer. In these cases, your health care provider may recommend more frequent screening and Pap tests. Colorectal Cancer  This type of cancer can be detected and often prevented.  Routine colorectal cancer screening usually begins at 65 years of age and continues through 65 years of age.  Your health care provider may recommend screening at an earlier age if you have risk factors for colon cancer.  Your health care provider may also recommend using home test kits to check for hidden blood in the stool.  A small camera at the end of a tube can be used to examine your colon directly (sigmoidoscopy or colonoscopy). This is done to check for the earliest forms of colorectal cancer.  Routine screening usually begins at age 42.  Direct examination of the colon should be repeated every 5-10 years through 65 years of age. However, you may need to be screened more often if early forms of precancerous polyps or small growths are found. Skin Cancer  Check your skin from head to toe regularly.  Tell your health care provider  about any new moles or changes in moles, especially if there is a change in a mole's shape or color.  Also tell your health care provider if you have a mole that is larger than the size of a pencil eraser.  Always use sunscreen. Apply sunscreen liberally and repeatedly throughout the day.  Protect yourself by wearing long sleeves, pants, a wide-brimmed hat, and sunglasses whenever you are outside. HEART DISEASE, DIABETES, AND HIGH BLOOD PRESSURE   High blood pressure causes heart disease and increases the risk of stroke. High blood pressure is more likely to develop in:  People who have blood pressure in the high end of the normal range (130-139/85-89 mm Hg).  People who are overweight or obese.  People who are African American.  If you are 11-44 years of age, have your blood pressure checked every 3-5 years. If you are 73 years of age or older, have your blood pressure checked every year. You should have your blood pressure measured twice--once when you are at a hospital or clinic, and once when you are not at a hospital or clinic. Record the average of the two measurements. To check your blood pressure when you are not at a hospital or clinic, you can use:  An automated blood pressure machine at a pharmacy.  A home blood pressure monitor.  If you are between 88 years and 92 years old, ask your health care provider if you should take aspirin to prevent strokes.  Have regular diabetes screenings. This involves taking a blood sample to check your fasting blood sugar level.  If you are at a normal weight and have a low risk for diabetes, have this test once every three years after 65 years of age.  If you are overweight and have a high risk for diabetes, consider being tested at a younger age or more often. PREVENTING INFECTION  Hepatitis B  If you have a higher risk for hepatitis B, you should be screened for this virus. You are considered at high risk for hepatitis B if:  You were  born in a country where hepatitis B is common. Ask your health care provider which countries are considered high risk.  Your parents were born in a high-risk country, and you have not been immunized against hepatitis B (hepatitis B vaccine).  You have HIV or AIDS.  You use needles to inject street drugs.  You live  with someone who has hepatitis B.  You have had sex with someone who has hepatitis B.  You get hemodialysis treatment.  You take certain medicines for conditions, including cancer, organ transplantation, and autoimmune conditions. Hepatitis C  Blood testing is recommended for:  Everyone born from [birth year] through 1965.  Anyone with known risk factors for hepatitis C. Sexually transmitted infections (STIs)  You should be screened for sexually transmitted infections (STIs) including gonorrhea and chlamydia if:  You are sexually active and are younger than 65 years of age.  You are older than 65 years of age and your health care provider tells you that you are at risk for this type of infection.  Your sexual activity has changed since you were last screened and you are at an increased risk for chlamydia or gonorrhea. Ask your health care provider if you are at risk.  If you do not have HIV, but are at risk, it may be recommended that you take a prescription medicine daily to prevent HIV infection. This is called pre-exposure prophylaxis (PrEP). You are considered at risk if:  You are sexually active and do not regularly use condoms or know the HIV status of your partner(s).  You take drugs by injection.  You are sexually active with a partner who has HIV. Talk with your health care provider about whether you are at high risk of being infected with HIV. If you choose to begin PrEP, you should first be tested for HIV. You should then be tested every 3 months for as long as you are taking PrEP.  PREGNANCY   If you are premenopausal and you may become pregnant, ask your  health care provider about preconception counseling.  If you may become pregnant, take 400 to 800 micrograms (mcg) of folic acid every day.  If you want to prevent pregnancy, talk to your health care provider about birth control (contraception). OSTEOPOROSIS AND MENOPAUSE   Osteoporosis is a disease in which the bones lose minerals and strength with aging. This can result in serious bone fractures. Your risk for osteoporosis can be identified using a bone density scan.  If you are 54 years of age or older, or if you are at risk for osteoporosis and fractures, ask your health care provider if you should be screened.  Ask your health care provider whether you should take a calcium or vitamin D supplement to lower your risk for osteoporosis.  Menopause may have certain physical symptoms and risks.  Hormone replacement therapy may reduce some of these symptoms and risks. Talk to your health care provider about whether hormone replacement therapy is right for you.  HOME CARE INSTRUCTIONS   Schedule regular health, dental, and eye exams.  Stay current with your immunizations.   Do not use any tobacco products including cigarettes, chewing tobacco, or electronic cigarettes.  If you are pregnant, do not drink alcohol.  If you are breastfeeding, limit how much and how often you drink alcohol.  Limit alcohol intake to no more than 1 drink per day for nonpregnant women. One drink equals 12 ounces of beer, 5 ounces of wine, or 1 ounces of hard liquor.  Do not use street drugs.  Do not share needles.  Ask your health care provider for help if you need support or information about quitting drugs.  Tell your health care provider if you often feel depressed.  Tell your health care provider if you have ever been abused or do not feel safe at home.  This information is not intended to replace advice given to you by your health care provider. Make sure you discuss any questions you have with  your health care provider.   Document Released: 09/27/2010 Document Revised: 04/04/2014 Document Reviewed: 02/13/2013 Elsevier Interactive Patient Education 2016 [place] Prevention in the Home  Falls can cause injuries and can affect people from all age groups. There are many simple things that you can do to make your home safe and to help prevent falls. WHAT CAN I DO ON THE OUTSIDE OF MY HOME?  Regularly repair the edges of walkways and driveways and fix any cracks.  Remove high doorway thresholds.  Trim any shrubbery on the main path into your home.  Use bright outdoor lighting.  Clear walkways of debris and clutter, including tools and rocks.  Regularly check that handrails are securely fastened and in good repair. Both sides of any steps should have handrails.  Install guardrails along the edges of any raised decks or porches.  Have leaves, snow, and ice cleared regularly.  Use sand or salt on walkways during winter months.  In the garage, clean up any spills right away, including grease or oil spills. WHAT CAN I DO IN THE BATHROOM?  Use night lights.  Install grab bars by the toilet and in the tub and shower. Do not use towel bars as grab bars.  Use non-skid mats or decals on the floor of the tub or shower.  If you need to sit down while you are in the shower, use a plastic, non-slip stool.[name]  Keep the floor dry. Immediately clean up any water that spills on the floor.  Remove soap buildup in the tub or shower on a regular basis.  Attach bath mats securely with double-sided non-slip rug tape.  Remove throw rugs and other tripping hazards from the floor. WHAT CAN I DO IN THE BEDROOM?  Use night lights.  Make sure that a bedside light is easy to reach.  Do not use oversized bedding that drapes onto the floor.  Have a firm chair that has side arms to use for getting dressed.  Remove throw rugs and other tripping hazards from the floor. WHAT CAN I DO  IN THE KITCHEN?   Clean up any spills right away.  Avoid walking on wet floors.  Place frequently used items in easy-to-reach places.  If you need to reach for something above you, use a sturdy step stool that has a grab bar.  Keep electrical cables out of the way.  Do not use floor polish or wax that makes floors slippery. If you have to use wax, make sure that it is non-skid floor wax.  Remove throw rugs and other tripping hazards from the floor. WHAT CAN I DO IN THE STAIRWAYS?  Do not leave any items on the stairs.  Make sure that there are handrails on both sides of the stairs. Fix handrails that are broken or loose. Make sure that handrails are as long as the stairways.  Check any carpeting to make sure that it is firmly attached to the stairs. Fix any carpet that is loose or worn.  Avoid having throw rugs at the top or bottom of stairways, or secure the rugs with carpet tape to prevent them from moving.  Make sure that you have a light switch at the top of the stairs and the bottom of the stairs. If you do not have them, have them installed. WHAT ARE SOME OTHER FALL PREVENTION  TIPS?  Wear closed-toe shoes that fit well and support your feet. Wear shoes that have rubber soles or low heels.  When you use a stepladder, make sure that it is completely opened and that the sides are firmly locked. Have someone hold the ladder while you are using it. Do not climb a closed stepladder.  Add color or contrast paint or tape to grab bars and handrails in your home. Place contrasting color strips on the first and last steps.  Use mobility aids as needed, such as canes, walkers, scooters, and crutches.  Turn on lights if it is dark. Replace any light bulbs that burn out.  Set up furniture so that there are clear paths. Keep the furniture in the same spot.  Fix any uneven floor surfaces.  Choose a carpet design that does not hide the edge of steps of a stairway.  Be aware of any  and all pets.  Review your medicines with your healthcare provider. Some medicines can cause dizziness or changes in blood pressure, which increase your risk of falling. Talk with your health care provider about other ways that you can decrease your risk of falls. This may include working with a physical therapist or trainer to improve your strength, balance, and endurance.   This information is not intended to replace advice given to you by your health care provider. Make sure you discuss any questions you have with your health care provider.   Document Released: 03/04/2002 Document Revised: 07/29/2014 Document Reviewed: 04/18/2014 Elsevier Interactive Patient Education [employer].

## 2015-07-27 NOTE — Progress Notes (Signed)
Patient ID: Kristy Garza, female   DOB: Dec 12, 1950, 65 y.o.   MRN: [id number]    Subjective:   Kristy Garza is a 65 y.o. female who presents for a subsequent Medicare Annual Wellness Visit and osteoporosis.  She has several concerns and questions: 1.  Wants to know results of labs from 07/11/15 about possible yeast infection 2.  She want to know what she can do to gain weight.  Mentions that GI has recommended FODMAP diet but she also has reduced gluten in diet (testing was negative for celiac) 3.  She has several herbal and vitamin products she is interesting in taking and wonders if they might interfere with her other medications. Products include [employer]; DV3 multivitamin; Stress and energy MVI; Chamomile and lavendar tea; 2 bottles of chinese herbal combination given to her by an acupuncturist.   She is also due to have DEXA rechecked. She has osteoporosis and has taken alendronate in past but refuses all bisphosphonatea and evista because she is concerned about side effects. 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 She took Forteo for a few months in the past but she stopped because she feel like she had the flu and she also mentions that [place] is too expensive. We discussed Prolia as an option but she declined due to side effects and states her chiropractor recommended she not take Prolia.  Review of Systems  Constitutional: Positive for weight loss and malaise/fatigue.  Eyes: Negative.   Respiratory: Positive for cough.   Cardiovascular: Negative.   Gastrointestinal: Positive for constipation (and bloating).  Musculoskeletal: Positive for joint pain.  Skin: Negative.   Neurological: Positive for weakness and headaches (take excedrin for this daily).  Endo/Heme/Allergies: Negative.   Psychiatric/Behavioral: The patient is nervous/anxious (was prescribed escitalopram but has not started yet.).      Current Medications  (verified) Outpatient Encounter Prescriptions as of 07/27/2015  Medication Sig  . acetaminophen (TYLENOL) 500 MG tablet Take 500 mg by mouth every 6 (six) hours as needed. Reported on 07/27/2015  . albuterol (PROVENTIL HFA;VENTOLIN HFA) 108 (90 BASE) MCG/ACT inhaler Inhale 2 puffs into the lungs every 4 (four) hours as needed for wheezing or shortness of breath.  [name] albuterol (PROVENTIL) (2.5 MG/3ML) 0.083% nebulizer solution Take 3 mLs (2.5 mg total) by nebulization every 6 (six) hours as needed for wheezing or shortness of breath.  [name] aspirin-acetaminophen-caffeine (EXCEDRIN MIGRAINE) 250-250-65 MG per tablet Take 1 tablet by mouth every 6 (six) hours as needed for headache.  [name] azelastine (ASTELIN) 0.1 % nasal spray Place 2 sprays into both nostrils 2 (two) times daily. Use in each nostril as directed  . b complex vitamins capsule Take 1 capsule by mouth daily.  . benzonatate (TESSALON) 200 MG capsule Take 1 capsule (200 mg total) by mouth 3 (three) times daily as needed for cough.  . Cholecalciferol (VITAMIN D PO) Take 4,000 Units by mouth daily.   . fexofenadine (ALLEGRA) 60 MG tablet Take 60 mg by mouth 2 (two) times daily.  . fluconazole (DIFLUCAN) 100 MG tablet Take 1 tablet (100 mg total) by mouth daily.  . fluticasone (FLONASE) 50 MCG/ACT nasal spray Place 2 sprays into both nostrils daily.  [name] guaiFENesin (MUCINEX) 600 MG 12 hr tablet Take 600 mg by mouth 2 (two) times daily.  [name] lidocaine (XYLOCAINE) 2 % jelly Apply topically as needed.  [name] LINZESS 290 MCG CAPS capsule TAKE 1 CAPSULE (290 MCG TOTAL) BY MOUTH DAILY.  [name]  Multiple Vitamin (MULTIVITAMIN) tablet Take 1 tablet by mouth daily.  . pantoprazole (PROTONIX) 40 MG tablet Take 40 mg by mouth daily.  . peppermint oil liquid by Does not apply route as needed.  . ranitidine (ZANTAC) 150 MG tablet Take 150 mg by mouth 2 (two) times daily.  . traMADol (ULTRAM) 50 MG tablet Take 1 tablet (50 mg total) by mouth every 6 (six) hours as needed.  .  calcium citrate-vitamin D (CITRACAL+D) 315-200 MG-UNIT per tablet Take 1 tablet by mouth. Reported on 07/27/2015  . diclofenac sodium (VOLTAREN) 1 % GEL Apply 1 g topically.   [name] escitalopram (LEXAPRO) 10 MG tablet Take 1 tablet (10 mg total) by mouth daily. (Patient not taking: Reported on 07/27/2015)  . Magnesium Oxide 400 (240 MG) MG TABS Take 1 tablet by mouth daily. Reported on 07/27/2015  . Nutritional Supplements (DHEA PO) Take 5 mg by mouth daily. Reported on 07/27/2015  . SYMBICORT 80-4.5 MCG/ACT inhaler INHALE 2 PUFFS INTO THE LUNGS 2 (TWO) TIMES DAILY. (Patient not taking: Reported on 07/27/2015)  . [DISCONTINUED] Calcium Glycerophosphate (PRELIEF PO) Take 1 tablet by mouth as needed (prior to acid containing foods). Reported on 07/27/2015  . [DISCONTINUED] ELMIRON 100 MG capsule Take 100 mg by mouth 2 (two) times daily. Reported on 07/27/2015   Facility-Administered Encounter Medications as of 07/27/2015  Medication  . bupivacaine (MARCAINE) 0.5 % 10 mL, triamcinolone acetonide (KENALOG-40) 40 mg injection  . bupivacaine (MARCAINE) 0.5 % 15 mL, phenazopyridine (PYRIDIUM) 400 mg bladder mixture  . cyanocobalamin ((VITAMIN B-12)) injection 1,000 mcg    Allergies (verified) Augmentin; Avelox; Biaxin; Cedax; Ciprofloxacin; Clindamycin/lincomycin; Gabapentin; Levofloxacin; Lyrica; Singulair; and Sulfonamide derivatives   History: Past Medical History  Diagnosis Date  . Asthma   . Osteoporosis   . Acid reflux   . PONV (postoperative nausea and vomiting)   . Tinnitus   . Allergy   . Interstitial cystitis   . Trigeminal neuralgia     Atypical trigeminal neuralgia  . Arthritis     ARTHRITIS IN NECK BY DR. Michelle Garza   Past Surgical History  Procedure Laterality Date  . Abdominal hysterectomy  2000  . 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 Ethmoidectomy  2012  . Septoplasty  1980's  . Cystoscopy with hydrodistension and biopsy N/A 06/11/2012    Procedure:  CYSTOSCOPY/BIOPSY/HYDRODISTENSION with Instillation of Pyridium and Marcaine and Kenalog;  Surgeon: Ailene Rud, MD;  Location[facility];  Service: Urology;  Laterality: N/A;  1 hour requested for this case  BLADDER BIOPSY   Family History  Problem Relation Age of Onset  . Hyperlipidemia Mother   . Arthritis Mother   . Cancer Mother   . Lymphoma Mother   . Cancer Father   . Allergies Father   . Allergies Sister   . Allergies Sister    Social History   Occupational History  . Not on file.   Social History Main Topics  . Smoking status: Never Smoker   . Smokeless tobacco: Never Used  . Alcohol Use: No  . Drug Use: No  . Sexual Activity: No    Dietary issues and exercise activities: Current Exercise Habits: Structured exercise class, Type of exercise: yoga, Time (Minutes): 40, Frequency (Times/Week): 2, Weekly Exercise (Minutes/Week): 80, Exercise limited by: None identified  Current Dietary habits:  Patient is trying to limit dairy, gluten and sometimes following FODMAP diet.   She has appt with allergist in the next  2-3 weeks, who will test for food allergies.  Objective:    Today's Vitals   07/27/15 1604  BP: 110/68  Pulse: 75  Height: 5\' 9"  (1.753 m)  Weight: 104 lb (47.174 kg)  PainSc: 0-No pain   Body mass index is 15.35 kg/(m^2).   DEXA performed today and results reviewed - BMD worsen and T-Score decrease.   Activities of Daily Living In your present state of health, do you have any difficulty performing the following activities: 07/27/2015  Hearing? N  Vision? N  Difficulty concentrating or making decisions? N  Walking or climbing stairs? N  Dressing or bathing? N  Doing errands, shopping? N  Preparing Food and eating ? N  Using the Toilet? N  In the past six months, have you accidently leaked urine? N  Do you have problems with loss of bowel control? N  Managing your Medications? N  Managing your Finances? N  Housekeeping  or managing your Housekeeping? N      Cardiac Risk Factors include: none  Depression Screen PHQ 2/9 Scores 07/27/2015 07/11/2015 04/02/2015 02/02/2015  PHQ - 2 Score 1 0 0 0  PHQ- 9 Score - - - -    Fall Risk Fall Risk  07/27/2015 04/02/2015 02/02/2015 12/26/2014 12/18/2014  Falls in the past year? No No No No No    Cognitive Function: MMSE - Mini Mental State Exam 07/27/2015 07/11/2014  Orientation to time 5 5  Orientation to Place 5 5  Registration 3 3  Attention/ Calculation 4 5  Recall 3 2  Language- name 2 objects 2 2  Language- repeat 1 1  Language- follow 3 step command 3 3  Language- read & follow direction 1 1  Write a sentence 1 1  Copy design 1 1  Total score 29 29    Immunizations and Health Maintenance Immunization History  Administered Date(s) Administered  . Influenza,inj,Quad PF,36+ Mos 12/28/2012, 12/25/2013, 12/26/2014  . Pneumococcal Conjugate-13 02/07/2013  . Tdap 05/21/2010  . Zoster 03/27/2012   Health Maintenance Due  Topic Date Due  . Hepatitis C Screening  June 13, 1950  . HIV Screening  11/18/1965    Patient Care Team: Timmothy Euler, MD as PCP - General (Family Medicine) Deneise Lever, MD as Consulting Physician (Pulmonary Disease) Minus Breeding, MD as Consulting Physician (Cardiology)  Indicate any recent Medical Services you may have received from other than [facility] providers in the past year (date may be approximate).    Assessment:    Annual Wellness Visit  Medication Management Low Weight - possible food allergies Osteoporosis   Screening Tests Health Maintenance  Topic Date Due  . Hepatitis C Screening  12/07/1950  . HIV Screening  11/18/1965  . PAP SMEAR  02/02/2016 (Originally 06/06/2013)  . MAMMOGRAM  08/31/2015  . INFLUENZA VACCINE  10/27/2015  . COLONOSCOPY  03/28/2017  . DEXA SCAN  07/26/2017  . TETANUS/TDAP  05/21/2020  . ZOSTAVAX  Completed        Plan:   During the course of the visit Kristy Garza was educated and  counseled about the following appropriate screening and preventive services:   Vaccines to include Pneumoccal, Influenza, Hepatitis B, Td, Zostavax - currently UTD on all required vaccines  Colorectal cancer screening - colonoscopy and FOBT UTD  Cardiovascular disease screening - lipids UTD, no history of CV disease  Diabetes screening - UTD  Bone Denisty / Osteoporosis Screening - done today.  Results and treatment option discussed at length with patient. She current declines  treatment for osteoporosis.  Risk of future fracture is discussed.   Restart calcium citrate 2 tablets daily and continue to eat calcium rich food such as green leafy vegetables.  Mammogram - appt made for 09/2015  Nutrition counseling. Encouraged her to continue with plan for allergy testing.  Given recipes for smoothies without dairy (this might be a good alternative to daily based Boost) or she can switch to fruit flavored, non-dairy Boost.  Advanced Directives - information given in office today  Advised try to decrease use of Excedrin (try cutting to 1/2 tablet daily to start and then D/C) as the caffeine in Excedrin might be causing rebound HAs.  Continue Yoga at [employer] program at [facility]  I recommended she not take Pau D' Archo due limited studies showing effectiveness. I also recommended she not take any additional MVI formulas as they usually have overlapping ingredients.  One MVI per day is enough.   Orders Placed This Encounter  Procedures  . DG Bone Density    Order Specific Question:  Reason for Exam (SYMPTOM  OR DIAGNOSIS REQUIRED)    Answer:  osteoporosis    Order Specific Question:  Preferred imaging location?    Answer:  Internal  . Hepatitis C antibody  . DHEA-Sulfate, Serum  . Iodine, Serum/Plasma  . VITAMIN D 25 Hydroxy (Vit-D Deficiency, Fractures)     Patient Instructions (the written plan) were given to the patient.   Cherre Robins, [facility]   07/28/2015         I  have reviewed and agree with the above AWV documentation.  [name], M.D.

## 2015-07-28 ENCOUNTER — Encounter: Payer: Self-pay | Admitting: Pharmacist

## 2015-07-29 ENCOUNTER — Telehealth (HOSPITAL_COMMUNITY): Payer: Self-pay | Admitting: Cardiology

## 2015-07-29 ENCOUNTER — Telehealth: Payer: Self-pay | Admitting: Family Medicine

## 2015-07-29 NOTE — Telephone Encounter (Signed)
I called to schedule the myoview that Dr. Percival Spanish has ordered for this patient.  She wants to speak with the nurse first---she thinks she may not need to have this done.

## 2015-07-29 NOTE — Telephone Encounter (Signed)
Spoke with pt regarding sxs If sxs persist, pt will come in for appt

## 2015-07-29 NOTE — Telephone Encounter (Signed)
Spoke with pt, explained the concern with blockage due to bp falling with exercise instead of elevating. Explained the reason for the nuclear stress test and what the results will give [name]. Patient voiced understanding and the test was scheduled.

## 2015-07-30 LAB — HEPATITIS C ANTIBODY

## 2015-07-30 LAB — VITAMIN D 25 HYDROXY (VIT D DEFICIENCY, FRACTURES): Vit D, 25-Hydroxy: 58 ng/mL (ref 30.0–100.0)

## 2015-07-30 LAB — IODINE, SERUM/PLASMA: Iodine: 32 ug/L — ABNORMAL LOW (ref 40.0–92.0)

## 2015-07-30 LAB — DHEA-SULFATE, SERUM: DHEA-Sulfate, LCMS: 20 ug/dL

## 2015-08-03 ENCOUNTER — Other Ambulatory Visit: Payer: Self-pay | Admitting: Internal Medicine

## 2015-08-03 ENCOUNTER — Telehealth: Payer: Self-pay | Admitting: Internal Medicine

## 2015-08-03 ENCOUNTER — Other Ambulatory Visit: Payer: Self-pay | Admitting: Family

## 2015-08-03 NOTE — Telephone Encounter (Signed)
Suggest Mucinex-D from pharmacist for cough and congestion

## 2015-08-03 NOTE — Telephone Encounter (Signed)
Attempted to contact patient, no answer, will call back. 

## 2015-08-03 NOTE — Telephone Encounter (Signed)
Spoke with pt.  C/o cough, oocas prod (clear), headaches, sinus congestion, PND, sneezing, ear fullness, cough keeping pt up at night.  Recently changed from [place] to [place] and this helped some.  Please advise.  Allergies  Allergen Reactions  . Augmentin [Amoxicillin-Pot Clavulanate] Diarrhea  . Avelox [Moxifloxacin Hcl In Nacl] Other (See Comments)    Tremors   . Biaxin [Clarithromycin]     Unsure of reaction  . Cedax [Ceftibuten] Other (See Comments)    tremors  . Ciprofloxacin Other (See Comments)    Blurred vision  . Clindamycin/Lincomycin     tachycardia  . Gabapentin     Constipation  . Levofloxacin Other (See Comments)    Feels dehydrated  . Lyrica [Pregabalin]     Drowsiness, dry mouth  . Singulair [Montelukast Sodium] Other (See Comments)    Numbness and tingling in hand.  . Sulfonamide Derivatives Other (See Comments)    Gi upset    Current Outpatient Prescriptions on File Prior to Visit  Medication Sig Dispense Refill  . acetaminophen (TYLENOL) 500 MG tablet Take 500 mg by mouth every 6 (six) hours as needed. Reported on 07/27/2015    . albuterol (PROVENTIL HFA;VENTOLIN HFA) 108 (90 BASE) MCG/ACT inhaler Inhale 2 puffs into the lungs every 4 (four) hours as needed for wheezing or shortness of breath. 1 Inhaler prn  . albuterol (PROVENTIL) (2.5 MG/3ML) 0.083% nebulizer solution Take 3 mLs (2.5 mg total) by nebulization every 6 (six) hours as needed for wheezing or shortness of breath. 150 mL 1  . aspirin-acetaminophen-caffeine (EXCEDRIN MIGRAINE) [id number] MG per tablet Take 1 tablet by mouth every 6 (six) hours as needed for headache.    [name] azelastine (ASTELIN) 0.1 % nasal spray Place 2 sprays into both nostrils 2 (two) times daily. Use in each nostril as directed 30 mL 12  . b complex vitamins capsule Take 1 capsule by mouth daily.    . benzonatate (TESSALON) 200 MG capsule Take 1 capsule (200 mg total) by mouth 3 (three) times daily as needed for cough. 30 capsule  1  . calcium citrate-vitamin D (CITRACAL+D) 315-200 MG-UNIT per tablet Take 1 tablet by mouth. Reported on 07/27/2015    . Cholecalciferol (VITAMIN D PO) Take 4,000 Units by mouth daily.     . diclofenac sodium (VOLTAREN) 1 % GEL Apply 1 g topically.     [name] escitalopram (LEXAPRO) 10 MG tablet Take 1 tablet (10 mg total) by mouth daily. (Patient not taking: Reported on 07/27/2015) 30 tablet 1  . fexofenadine (ALLEGRA) 60 MG tablet Take 60 mg by mouth 2 (two) times daily.    . fluconazole (DIFLUCAN) 100 MG tablet Take 1 tablet (100 mg total) by mouth daily. 15 tablet 0  . fluticasone (FLONASE) 50 MCG/ACT nasal spray Place 2 sprays into both nostrils daily. 16 g 11  . guaiFENesin (MUCINEX) 600 MG 12 hr tablet Take 600 mg by mouth 2 (two) times daily.    [name] lidocaine (XYLOCAINE) 2 % jelly Apply topically as needed.    [name] LINZESS 290 MCG CAPS capsule TAKE 1 CAPSULE (290 MCG TOTAL) BY MOUTH DAILY. 30 capsule 4  . Magnesium Oxide 400 (240 MG) MG TABS Take 1 tablet by mouth daily. Reported on 07/27/2015    . Multiple Vitamin (MULTIVITAMIN) tablet Take 1 tablet by mouth daily.    . Nutritional Supplements (DHEA PO) Take 5 mg by mouth daily. Reported on 07/27/2015    . pantoprazole (PROTONIX) 40 MG tablet Take 40 mg by  mouth daily.    . peppermint oil liquid by Does not apply route as needed.    . ranitidine (ZANTAC) 150 MG tablet Take 150 mg by mouth 2 (two) times daily.    . SYMBICORT 80-4.5 MCG/ACT inhaler INHALE 2 PUFFS INTO THE LUNGS 2 (TWO) TIMES DAILY. (Patient not taking: Reported on 07/27/2015) 10.2 Inhaler 2  . traMADol (ULTRAM) 50 MG tablet Take 1 tablet (50 mg total) by mouth every 6 (six) hours as needed. 30 tablet 0   Current Facility-Administered Medications on File Prior to Visit  Medication Dose Route Frequency Provider Last Rate Last Dose  . bupivacaine (MARCAINE) 0.5 % 10 mL, triamcinolone acetonide (KENALOG-40) 40 mg injection   Subcutaneous Once Carolan Clines, MD      . bupivacaine  (MARCAINE) 0.5 % 15 mL, phenazopyridine (PYRIDIUM) 400 mg bladder mixture   Bladder Instillation Once Carolan Clines, MD      . cyanocobalamin ((VITAMIN B-12)) injection 1,000 mcg  1,000 mcg Intramuscular Q30 days [name], MD   1,000 mcg at 06/16/14 1324

## 2015-08-04 ENCOUNTER — Ambulatory Visit: Payer: Medicare Other | Admitting: Allergy and Immunology

## 2015-08-04 NOTE — Telephone Encounter (Signed)
Pt is aware of CY's recommendation. Nothing further was needed. 

## 2015-08-04 NOTE — Telephone Encounter (Signed)
Last seen 07/25/15 Dr Livia Snellen  If approved route to nurse to call into [facility]

## 2015-08-04 NOTE — Telephone Encounter (Signed)
Please review and advise.

## 2015-08-05 ENCOUNTER — Inpatient Hospital Stay (HOSPITAL_COMMUNITY): Admission: RE | Admit: 2015-08-05 | Payer: Self-pay | Source: Ambulatory Visit

## 2015-08-10 ENCOUNTER — Encounter: Payer: Self-pay | Admitting: Family Medicine

## 2015-08-10 ENCOUNTER — Ambulatory Visit (INDEPENDENT_AMBULATORY_CARE_PROVIDER_SITE_OTHER): Payer: Medicare Other | Admitting: Family Medicine

## 2015-08-10 VITALS — BP 96/66 | HR 86 | Temp 97.9°F | Ht 69.0 in | Wt 105.0 lb

## 2015-08-10 DIAGNOSIS — F411 Generalized anxiety disorder: Secondary | ICD-10-CM | POA: Diagnosis not present

## 2015-08-10 DIAGNOSIS — B379 Candidiasis, unspecified: Secondary | ICD-10-CM | POA: Diagnosis not present

## 2015-08-10 DIAGNOSIS — R300 Dysuria: Secondary | ICD-10-CM | POA: Diagnosis not present

## 2015-08-10 DIAGNOSIS — K589 Irritable bowel syndrome without diarrhea: Secondary | ICD-10-CM | POA: Diagnosis not present

## 2015-08-10 LAB — MICROSCOPIC EXAMINATION
BACTERIA UA: NONE SEEN
EPITHELIAL CELLS (NON RENAL): NONE SEEN /HPF (ref 0–10)
RBC, UA: NONE SEEN /hpf (ref 0–?)

## 2015-08-10 LAB — URINALYSIS, COMPLETE
BILIRUBIN UA: NEGATIVE
Glucose, UA: NEGATIVE
KETONES UA: NEGATIVE
Nitrite, UA: NEGATIVE
PH UA: 6.5 (ref 5.0–7.5)
PROTEIN UA: NEGATIVE
RBC, UA: NEGATIVE
Urobilinogen, Ur: 0.2 mg/dL (ref 0.2–1.0)

## 2015-08-10 LAB — WET PREP FOR TRICH, YEAST, CLUE
Clue Cell Exam: NEGATIVE
Trichomonas Exam: NEGATIVE
Yeast Exam: NEGATIVE

## 2015-08-10 MED ORDER — ALPRAZOLAM 0.5 MG PO TABS
0.5000 mg | ORAL_TABLET | Freq: Two times a day (BID) | ORAL | Status: DC
Start: 2015-08-10 — End: 2015-08-10

## 2015-08-10 MED ORDER — ALPRAZOLAM 0.5 MG PO TABS: 0.5000 mg | ORAL_TABLET | Freq: Two times a day (BID) | ORAL | Status: DC

## 2015-08-10 NOTE — Progress Notes (Signed)
BP 96/66 mmHg  Pulse 86  Temp(Src) 97.9 F (36.6 C) (Oral)  Ht 5\' 9"  (1.753 m)  Wt 105 lb (47.628 kg)  BMI 15.50 kg/m2   Subjective:    Patient ID: Kristy Garza, female    DOB: 27-Sep-1950, 65 y.o.   MRN: [id number]  HPI: Kristy Garza is a 65 y.o. female presenting on 08/10/2015 for Anxiety   HPI Anxiety and irritable bowels Patient comes in today for an anxiety check. She has not started taking her Lexapro because she was concerned about what it might or might not help with. She denies any suicidal ideations but is very anxious most of the time. She has a lot of anxiety associated with her medical conditions which is weight loss and bowel issues to where she can't keep things down. She has had extensive workups for bowel issues and has never found anything. She continues to see a chiropractor who suggested multiple upon multiple supplements. Most of the supplements when looking at them are vitamins and minerals we just cautioned her about overdoing the number of vitamins and minerals that she takes when she is adding different ones together.  Dysuria Patient has been having dysuria and wants to have her urine tested. She denies any hematuria or fevers or chills. Part of the dysuria may be coming from vaginal irritation as it's mostly at the urethral opening. She denies any flank pain. She is also been having some vaginal discharge since she finished a course of 2 weeks of Diflucan.  Irritable bowel Patient has chronic irritable bowel syndrome and wants to be tested for stool studies. She has had multiple scopes and we tested her for Candida antibodies which came back and consequential. She is convinced that she has some sort of infection going on in her gut that is causing these symptoms.  Relevant past medical, surgical, family and social history reviewed and updated as indicated. Interim medical history since our last visit reviewed. Allergies and medications reviewed and  updated.  Review of Systems  Constitutional: Negative for fever and chills.  HENT: Negative for congestion, ear discharge and ear pain.   Eyes: Negative for redness and visual disturbance.  Respiratory: Negative for chest tightness and shortness of breath.   Cardiovascular: Negative for chest pain and leg swelling.  Genitourinary: Negative for dysuria and difficulty urinating.  Musculoskeletal: Negative for back pain and gait problem.  Skin: Negative for color change and rash.  Neurological: Negative for dizziness, light-headedness and headaches.  Psychiatric/Behavioral: Positive for sleep disturbance and dysphoric mood. Negative for suicidal ideas, behavioral problems, self-injury and agitation. The patient is nervous/anxious.   All other systems reviewed and are negative.   Per HPI unless specifically indicated above     Medication List       This list is accurate as of: 08/10/15  4:30 PM.  Always use your most recent med list.               acetaminophen 500 MG tablet  Commonly known as:  TYLENOL  Take 500 mg by mouth every 6 (six) hours as needed. Reported on 07/27/2015     albuterol (2.5 MG/3ML) 0.083% nebulizer solution  Commonly known as:  PROVENTIL  Take 3 mLs (2.5 mg total) by nebulization every 6 (six) hours as needed for wheezing or shortness of breath.     albuterol 108 (90 Base) MCG/ACT inhaler  Commonly known as:  PROVENTIL HFA;VENTOLIN HFA  Inhale 2 puffs into the lungs every 4 (  four) hours as needed for wheezing or shortness of breath.     ALPRAZolam 0.5 MG tablet  Commonly known as:  XANAX  Take 1 tablet (0.5 mg total) by mouth 2 (two) times daily.     aspirin-acetaminophen-caffeine 250-250-65 MG tablet  Commonly known as:  EXCEDRIN MIGRAINE  Take 1 tablet by mouth every 6 (six) hours as needed for headache.     azelastine 0.1 % nasal spray  Commonly known as:  ASTELIN  Place 2 sprays into both nostrils 2 (two) times daily. Use in each nostril as  directed     b complex vitamins capsule  Take 1 capsule by mouth daily.     benzonatate 200 MG capsule  Commonly known as:  TESSALON  TAKE 1 CAPSULE THREE TIMES A DAY AS NEEDED FOR COUGH     calcium citrate-vitamin D 315-200 MG-UNIT tablet  Commonly known as:  CITRACAL+D  Take 1 tablet by mouth. Reported on 07/27/2015     DHEA PO  Take 5 mg by mouth daily. Reported on 07/27/2015     diclofenac sodium 1 % Gel  Commonly known as:  VOLTAREN  Apply 1 g topically.     escitalopram 10 MG tablet  Commonly known as:  LEXAPRO  Take 1 tablet (10 mg total) by mouth daily.     fexofenadine 60 MG tablet  Commonly known as:  ALLEGRA  Take 60 mg by mouth 2 (two) times daily.     fluticasone 50 MCG/ACT nasal spray  Commonly known as:  FLONASE  Place 2 sprays into both nostrils daily.     lidocaine 2 % jelly  Commonly known as:  XYLOCAINE  Apply topically as needed.     LINZESS 290 MCG Caps capsule  Generic drug:  linaclotide  TAKE 1 CAPSULE (290 MCG TOTAL) BY MOUTH DAILY.     Magnesium Oxide 400 (240 Mg) MG Tabs  Take 1 tablet by mouth daily. Reported on 07/27/2015     multivitamin tablet  Take 1 tablet by mouth daily.     pantoprazole 40 MG tablet  Commonly known as:  PROTONIX  Take 40 mg by mouth daily.     peppermint oil liquid  by Does not apply route as needed.     ranitidine 150 MG tablet  Commonly known as:  ZANTAC  Take 150 mg by mouth 2 (two) times daily.     SYMBICORT 80-4.5 MCG/ACT inhaler  Generic drug:  budesonide-formoterol  INHALE 2 PUFFS INTO THE LUNGS 2 (TWO) TIMES DAILY.     VITAMIN D PO  Take 4,000 Units by mouth daily.          Objective:    BP 96/66 mmHg  Pulse 86  Temp(Src) 97.9 F (36.6 C) (Oral)  Ht 5\' 9"  (1.753 m)  Wt 105 lb (47.628 kg)  BMI 15.50 kg/m2  Wt Readings from Last 3 Encounters:  08/10/15 105 lb (47.628 kg)  07/27/15 104 lb (47.174 kg)  07/25/15 105 lb 6.4 oz (47.809 kg)    Physical Exam  Constitutional: She is oriented  to person, place, and time. She appears well-developed and well-nourished. No distress.  Eyes: Conjunctivae and EOM are normal. Pupils are equal, round, and reactive to light.  Neck: Neck supple. No thyromegaly present.  Cardiovascular: Normal rate, regular rhythm, normal heart sounds and intact distal pulses.   No murmur heard. Pulmonary/Chest: Effort normal and breath sounds normal. No respiratory distress. She has no wheezes.  Musculoskeletal: Normal range of motion. She  exhibits no edema or tenderness.  Lymphadenopathy:    She has no cervical adenopathy.  Neurological: She is alert and oriented to person, place, and time. Coordination normal.  Skin: Skin is warm and dry. No rash noted. She is not diaphoretic.  Psychiatric: She has a normal mood and affect. Her behavior is normal.  Nursing note and vitals reviewed.  Results for orders placed or performed in visit on 07/27/15  Hepatitis C antibody  Result Value Ref Range   Hep C Virus Ab <0.1 0.0 - 0.9 s/co ratio  DHEA-Sulfate, Serum  Result Value Ref Range   DHEA-Sulfate, LCMS 20 ug/dL  Iodine, Serum/Plasma  Result Value Ref Range   Iodine 32.0 (L) 40.0 - 92.0 ug/L  VITAMIN D 25 Hydroxy (Vit-D Deficiency, Fractures)  Result Value Ref Range   Vit D, 25-Hydroxy 58.0 30.0 - 100.0 ng/mL      Assessment & Plan:   Problem List Items Addressed This Visit      Digestive   IBS (irritable bowel syndrome)   Relevant Orders   Cdiff NAA+O+P+Stool Culture     Other   Generalized anxiety disorder - Primary   Relevant Medications   ALPRAZolam (XANAX) 0.5 MG tablet    Other Visit Diagnoses    Candida infection        Just finished a two-week course of Diflucan week and a half ago. This would've covered any candidal infection that she would have    Relevant Orders    WET PREP FOR [place], YEAST, CLUE    Dysuria        Relevant Orders    Urinalysis, Complete    WET PREP FOR [place], YEAST, CLUE        Follow up plan: Return in  about 4 weeks (around 09/07/2015), or if symptoms worsen or fail to improve, for anxiety.  Counseling provided for all of the vaccine components Orders Placed This Encounter  Procedures  . Cdiff NAA+O+P+Stool Culture  . Urinalysis, Complete    Caryl Pina, MD [facility] 08/10/2015, 4:30 PM

## 2015-08-11 ENCOUNTER — Other Ambulatory Visit: Payer: Medicare Other

## 2015-08-11 DIAGNOSIS — K589 Irritable bowel syndrome without diarrhea: Secondary | ICD-10-CM | POA: Diagnosis not present

## 2015-08-11 NOTE — Telephone Encounter (Signed)
RX for Tramadol called into [facility] Okayed per Dr Livia Snellen

## 2015-08-12 ENCOUNTER — Encounter (HOSPITAL_COMMUNITY): Payer: Self-pay

## 2015-08-13 ENCOUNTER — Inpatient Hospital Stay (HOSPITAL_COMMUNITY): Admission: RE | Admit: 2015-08-13 | Payer: Self-pay | Source: Ambulatory Visit

## 2015-08-15 LAB — CDIFF NAA+O+P+STOOL CULTURE
E COLI SHIGA TOXIN ASSAY: NEGATIVE
Toxigenic C. Difficile by PCR: NEGATIVE

## 2015-08-17 ENCOUNTER — Telehealth: Payer: Self-pay | Admitting: Pharmacist

## 2015-08-17 NOTE — Telephone Encounter (Signed)
Patient saw chiropractor and he gave her supplement for IBS.  She has taken for last 6 to 7 days.  Per patient these supplements are to help with leaky gut.  She thinks supplements have cause problems with neuropathy and jaw pain.  I recommended she stop supplements is she thinks this is causing pain.

## 2015-08-20 ENCOUNTER — Ambulatory Visit (INDEPENDENT_AMBULATORY_CARE_PROVIDER_SITE_OTHER): Payer: Medicare Other | Admitting: Family Medicine

## 2015-08-20 ENCOUNTER — Encounter: Payer: Self-pay | Admitting: Family Medicine

## 2015-08-20 VITALS — BP 119/79 | HR 80 | Temp 99.4°F | Ht 69.0 in | Wt 105.0 lb

## 2015-08-20 DIAGNOSIS — J209 Acute bronchitis, unspecified: Secondary | ICD-10-CM | POA: Diagnosis not present

## 2015-08-20 MED ORDER — METHYLPREDNISOLONE ACETATE 80 MG/ML IJ SUSP
40.0000 mg | Freq: Once | INTRAMUSCULAR | Status: AC
  Administered 2015-08-20: 40 mg via INTRAMUSCULAR

## 2015-08-20 MED ORDER — DOXYCYCLINE HYCLATE 100 MG PO TABS: 100.0000 mg | ORAL_TABLET | Freq: Two times a day (BID) | ORAL | Status: DC

## 2015-08-20 NOTE — Progress Notes (Signed)
BP 119/79 mmHg  Pulse 80  Temp(Src) 99.4 F (37.4 C) (Oral)  Ht 5\' 9"  (1.753 m)  Wt 105 lb (47.628 kg)  BMI 15.50 kg/m2   Subjective:    Patient ID: Kristy Garza Kitchen, female    DOB: Apr 30, 1950, 65 y.o.   MRN: [id number]  HPI: Kristy Garza is a 65 y.o. female presenting on 08/20/2015 for Headache; Cough; and Sinusitis   HPI Cough and sinus congestion and headache Patient has been having cough and sinus congestion and headache has been going on for the past 2 weeks. She has been using Flonase and Mucinex and Allegra and nasal saline sprays and her inhalers orders and it is not getting any better. She denies any fevers or chills or shortness of breath or wheezing. Her cough has been productive mostly of clear sputum.  Relevant past medical, surgical, family and social history reviewed and updated as indicated. Interim medical history since our last visit reviewed. Allergies and medications reviewed and updated.  Review of Systems  Constitutional: Negative for fever and chills.  HENT: Positive for congestion, postnasal drip, rhinorrhea, sinus pressure, sneezing and sore throat. Negative for ear discharge and ear pain.   Eyes: Negative for pain, redness and visual disturbance.  Respiratory: Positive for cough. Negative for chest tightness and shortness of breath.   Cardiovascular: Negative for chest pain and leg swelling.  Genitourinary: Negative for dysuria and difficulty urinating.  Musculoskeletal: Negative for back pain and gait problem.  Skin: Negative for rash.  Neurological: Negative for light-headedness and headaches.  Psychiatric/Behavioral: Negative for behavioral problems and agitation.  All other systems reviewed and are negative.   Per HPI unless specifically indicated above     Medication List       This list is accurate as of: 08/20/15  4:07 PM.  Always use your most recent med list.               acetaminophen 500 MG tablet  Commonly known as:  TYLENOL    Take 500 mg by mouth every 6 (six) hours as needed. Reported on 07/27/2015     albuterol (2.5 MG/3ML) 0.083% nebulizer solution  Commonly known as:  PROVENTIL  Take 3 mLs (2.5 mg total) by nebulization every 6 (six) hours as needed for wheezing or shortness of breath.     albuterol 108 (90 Base) MCG/ACT inhaler  Commonly known as:  PROVENTIL HFA;VENTOLIN HFA  Inhale 2 puffs into the lungs every 4 (four) hours as needed for wheezing or shortness of breath.     ALPRAZolam 0.5 MG tablet  Commonly known as:  XANAX  Take 1 tablet (0.5 mg total) by mouth 2 (two) times daily.     aspirin-acetaminophen-caffeine 250-250-65 MG tablet  Commonly known as:  EXCEDRIN MIGRAINE  Take 1 tablet by mouth every 6 (six) hours as needed for headache.     azelastine 0.1 % nasal spray  Commonly known as:  ASTELIN  Place 2 sprays into both nostrils 2 (two) times daily. Use in each nostril as directed     b complex vitamins capsule  Take 1 capsule by mouth daily.     calcium citrate-vitamin D 315-200 MG-UNIT tablet  Commonly known as:  CITRACAL+D  Take 1 tablet by mouth. Reported on 07/27/2015     DHEA PO  Take 5 mg by mouth daily. Reported on 08/20/2015     diclofenac sodium 1 % Gel  Commonly known as:  VOLTAREN  Apply 1 g topically.  doxycycline 100 MG tablet  Commonly known as:  VIBRA-TABS  Take 1 tablet (100 mg total) by mouth 2 (two) times daily. 1 po bid     escitalopram 10 MG tablet  Commonly known as:  LEXAPRO  Take 1 tablet (10 mg total) by mouth daily.     fexofenadine 60 MG tablet  Commonly known as:  ALLEGRA  Take 60 mg by mouth 2 (two) times daily.     fluticasone 50 MCG/ACT nasal spray  Commonly known as:  FLONASE  Place 2 sprays into both nostrils daily.     lidocaine 2 % jelly  Commonly known as:  XYLOCAINE  Apply topically as needed.     LINZESS 290 MCG Caps capsule  Generic drug:  linaclotide  TAKE 1 CAPSULE (290 MCG TOTAL) BY MOUTH DAILY.     Magnesium Oxide 400  (240 Mg) MG Tabs  Take 1 tablet by mouth daily. Reported on 07/27/2015     multivitamin tablet  Take 1 tablet by mouth daily.     pantoprazole 40 MG tablet  Commonly known as:  PROTONIX  Take 40 mg by mouth daily.     peppermint oil liquid  by Does not apply route as needed.     ranitidine 150 MG tablet  Commonly known as:  ZANTAC  Take 150 mg by mouth 2 (two) times daily.     SYMBICORT 80-4.5 MCG/ACT inhaler  Generic drug:  budesonide-formoterol  INHALE 2 PUFFS INTO THE LUNGS 2 (TWO) TIMES DAILY.     VITAMIN D PO  Take 4,000 Units by mouth daily.           Objective:    BP 119/79 mmHg  Pulse 80  Temp(Src) 99.4 F (37.4 C) (Oral)  Ht 5\' 9"  (1.753 m)  Wt 105 lb (47.628 kg)  BMI 15.50 kg/m2  Wt Readings from Last 3 Encounters:  08/20/15 105 lb (47.628 kg)  08/10/15 105 lb (47.628 kg)  07/27/15 104 lb (47.174 kg)    Physical Exam  Constitutional: She is oriented to person, place, and time. She appears well-developed and well-nourished. No distress.  HENT:  Right Ear: Tympanic membrane, external ear and ear canal normal.  Left Ear: Tympanic membrane, external ear and ear canal normal.  Nose: Mucosal edema and rhinorrhea present. No epistaxis. Right sinus exhibits no maxillary sinus tenderness and no frontal sinus tenderness. Left sinus exhibits no maxillary sinus tenderness and no frontal sinus tenderness.  Mouth/Throat: Uvula is midline and mucous membranes are normal. Posterior oropharyngeal edema and posterior oropharyngeal erythema present. No oropharyngeal exudate or tonsillar abscesses.  Eyes: Conjunctivae and EOM are normal.  Cardiovascular: Normal rate, regular rhythm, normal heart sounds and intact distal pulses.   No murmur heard. Pulmonary/Chest: Effort normal and breath sounds normal. No respiratory distress. She has no wheezes.  Musculoskeletal: Normal range of motion. She exhibits no edema or tenderness.  Neurological: She is alert and oriented to person,  place, and time. Coordination normal.  Skin: Skin is warm and dry. No rash noted. She is not diaphoretic.  Psychiatric: She has a normal mood and affect. Her behavior is normal.  Nursing note and vitals reviewed.      Assessment & Plan:   Problem List Items Addressed This Visit    None    Visit Diagnoses    Acute bronchitis, unspecified organism    -  Primary    Relevant Medications    methylPREDNISolone acetate (DEPO-MEDROL) injection 40 mg (Start on 08/20/2015  4:15  PM)    doxycycline (VIBRA-TABS) 100 MG tablet        Follow up plan: Return if symptoms worsen or fail to improve.  Counseling provided for all of the vaccine components No orders of the defined types were placed in this encounter.    Caryl Pina, MD [facility] 08/20/2015, 4:07 PM

## 2015-08-26 ENCOUNTER — Telehealth: Payer: Self-pay | Admitting: Family Medicine

## 2015-08-27 NOTE — Telephone Encounter (Signed)
Patient has bought iodine supplement and would like to know recommended dose. The supplement she has is 179mcg per drop.  Advised that she use 2 drops in water and drink along with a meal once daily for 3 months.  Will recheck iodine level in 3 months.

## 2015-08-28 ENCOUNTER — Telehealth (HOSPITAL_COMMUNITY): Payer: Self-pay

## 2015-08-28 NOTE — Telephone Encounter (Signed)
Encounter complete. 

## 2015-09-02 ENCOUNTER — Ambulatory Visit (HOSPITAL_COMMUNITY)
Admission: RE | Admit: 2015-09-02 | Discharge: 2015-09-02 | Disposition: A | Discharge status: Home / Self Care | Payer: Medicare Other | Source: Ambulatory Visit | Attending: Cardiovascular Disease | Admitting: Cardiovascular Disease

## 2015-09-02 DIAGNOSIS — R079 Chest pain, unspecified: Secondary | ICD-10-CM | POA: Insufficient documentation

## 2015-09-02 DIAGNOSIS — K219 Gastro-esophageal reflux disease without esophagitis: Secondary | ICD-10-CM | POA: Insufficient documentation

## 2015-09-02 DIAGNOSIS — R072 Precordial pain: Secondary | ICD-10-CM | POA: Diagnosis not present

## 2015-09-02 DIAGNOSIS — R5383 Other fatigue: Secondary | ICD-10-CM | POA: Insufficient documentation

## 2015-09-02 LAB — MYOCARDIAL PERFUSION IMAGING
CHL CUP NUCLEAR SSS: 3
CHL CUP RESTING HR STRESS: 86 {beats}/min
CSEPPHR: 133 {beats}/min
LVDIAVOL: 86 mL (ref 46–106)
LVSYSVOL: 28 mL
NUC STRESS TID: 1.02
SDS: 2
SRS: 1

## 2015-09-02 MED ORDER — TECHNETIUM TC 99M TETROFOSMIN IV KIT
10.9000 | PACK | Freq: Once | INTRAVENOUS | Status: AC | PRN
  Administered 2015-09-02: 10.9 via INTRAVENOUS
  Filled 2015-09-02: qty 11

## 2015-09-02 MED ORDER — AMINOPHYLLINE 25 MG/ML IV SOLN
75.0000 mg | Freq: Once | INTRAVENOUS | Status: AC
  Administered 2015-09-02: 75 mg via INTRAVENOUS

## 2015-09-02 MED ORDER — TECHNETIUM TC 99M TETROFOSMIN IV KIT
32.9000 | PACK | Freq: Once | INTRAVENOUS | Status: AC | PRN
  Administered 2015-09-02: 32.9 via INTRAVENOUS
  Filled 2015-09-02: qty 33

## 2015-09-02 MED ORDER — REGADENOSON 0.4 MG/5ML IV SOLN
0.4000 mg | Freq: Once | INTRAVENOUS | Status: AC
  Administered 2015-09-02: 0.4 mg via INTRAVENOUS

## 2015-09-07 ENCOUNTER — Ambulatory Visit: Payer: Medicare Other | Admitting: Family Medicine

## 2015-09-12 ENCOUNTER — Ambulatory Visit (INDEPENDENT_AMBULATORY_CARE_PROVIDER_SITE_OTHER): Payer: Medicare Other | Admitting: Family Medicine

## 2015-09-12 ENCOUNTER — Encounter: Payer: Self-pay | Admitting: Family Medicine

## 2015-09-12 VITALS — BP 111/77 | HR 70 | Temp 97.8°F | Ht 69.0 in | Wt 103.0 lb

## 2015-09-12 DIAGNOSIS — J302 Other seasonal allergic rhinitis: Secondary | ICD-10-CM | POA: Diagnosis not present

## 2015-09-12 MED ORDER — PREDNISONE 20 MG PO TABS: ORAL_TABLET | ORAL | Status: DC

## 2015-09-12 NOTE — Progress Notes (Signed)
BP 111/77 mmHg  Pulse 70  Temp(Src) 97.8 F (36.6 C) (Oral)  Ht 5\' 9"  (1.753 m)  Wt 103 lb (46.72 kg)  BMI 15.20 kg/m2   Subjective:    Patient ID: Kristy Garza, female    DOB: 05-Dec-1950, 65 y.o.   MRN: [id number]  HPI: ISATU FRYMIRE is a 65 y.o. female presenting on 09/12/2015 for Sinusitis   HPI Sinus congestion and cough and ear pressure  Patient has been having chronic sinus congestion and cough and ear pressure. The last time she had prednisone it did help with this some. She is currently taking allergy pill and using Flonase and Mucinex and they have helped some but she still has this chronic issue. She has had allergy testing before and she does have an ENT doctor. She does feel like she needs go act to the ENT doc and I agree with this. I also agree with allergy testing and shots if warranted. She says she has tried Singulair before and did not like the way that made her feel. She denies any fevers or chills or shortness of breath or wheezing. The symptoms are mostly nasal and sinus and inner ears and then nonproductive cough  Relevant past medical, surgical, family and social history reviewed and updated as indicated. Interim medical history since our last visit reviewed. Allergies and medications reviewed and updated.  Review of Systems  Constitutional: Negative for fever and chills.  HENT: Positive for congestion, ear pain, postnasal drip, rhinorrhea, sinus pressure, sneezing and sore throat. Negative for ear discharge.   Eyes: Negative for pain, redness and visual disturbance.  Respiratory: Positive for cough. Negative for chest tightness and shortness of breath.   Cardiovascular: Negative for chest pain and leg swelling.  Genitourinary: Negative for dysuria and difficulty urinating.  Musculoskeletal: Negative for back pain and gait problem.  Skin: Negative for rash.  Neurological: Negative for light-headedness and headaches.  Psychiatric/Behavioral: Negative for  behavioral problems and agitation.  All other systems reviewed and are negative.   Per HPI unless specifically indicated above     Medication List       This list is accurate as of: 09/12/15 10:35 AM.  Always use your most recent med list.               albuterol (2.5 MG/3ML) 0.083% nebulizer solution  Commonly known as:  PROVENTIL  Take 3 mLs (2.5 mg total) by nebulization every 6 (six) hours as needed for wheezing or shortness of breath.     ALPRAZolam 0.5 MG tablet  Commonly known as:  XANAX  Take 1 tablet (0.5 mg total) by mouth 2 (two) times daily.     aspirin-acetaminophen-caffeine 250-250-65 MG tablet  Commonly known as:  EXCEDRIN MIGRAINE  Take 1 tablet by mouth every 6 (six) hours as needed for headache.     azelastine 0.1 % nasal spray  Commonly known as:  ASTELIN  Place 2 sprays into both nostrils 2 (two) times daily. Use in each nostril as directed     b complex vitamins capsule  Take 1 capsule by mouth daily.     calcium citrate-vitamin D 315-200 MG-UNIT tablet  Commonly known as:  CITRACAL+D  Take 1 tablet by mouth. Reported on 07/27/2015     DHEA PO  Take 5 mg by mouth daily. Reported on 08/20/2015     diclofenac sodium 1 % Gel  Commonly known as:  VOLTAREN  Apply 1 g topically.     escitalopram  10 MG tablet  Commonly known as:  LEXAPRO  Take 1 tablet (10 mg total) by mouth daily.     fexofenadine 60 MG tablet  Commonly known as:  ALLEGRA  Take 60 mg by mouth 2 (two) times daily.     fluticasone 50 MCG/ACT nasal spray  Commonly known as:  FLONASE  Place 2 sprays into both nostrils daily.     IODINE (KELP) PO  Take 300 mcg by mouth daily.     lidocaine 2 % jelly  Commonly known as:  XYLOCAINE  Apply topically as needed.     LINZESS 290 MCG Caps capsule  Generic drug:  linaclotide  TAKE 1 CAPSULE (290 MCG TOTAL) BY MOUTH DAILY.     Magnesium Oxide 400 (240 Mg) MG Tabs  Take 1 tablet by mouth daily. Reported on 09/12/2015      multivitamin tablet  Take 1 tablet by mouth daily.     pantoprazole 40 MG tablet  Commonly known as:  PROTONIX  Take 40 mg by mouth daily.     peppermint oil liquid  by Does not apply route as needed.     predniSONE 20 MG tablet  Commonly known as:  DELTASONE  2 po at same time daily for 5 days     ranitidine 150 MG tablet  Commonly known as:  ZANTAC  Take 150 mg by mouth 2 (two) times daily.     SYMBICORT 80-4.5 MCG/ACT inhaler  Generic drug:  budesonide-formoterol  INHALE 2 PUFFS INTO THE LUNGS 2 (TWO) TIMES DAILY.     VITAMIN D PO  Take 4,000 Units by mouth daily.           Objective:    BP 111/77 mmHg  Pulse 70  Temp(Src) 97.8 F (36.6 C) (Oral)  Ht 5\' 9"  (1.753 m)  Wt 103 lb (46.72 kg)  BMI 15.20 kg/m2  Wt Readings from Last 3 Encounters:  09/12/15 103 lb (46.72 kg)  09/02/15 104 lb (47.174 kg)  08/20/15 105 lb (47.628 kg)    Physical Exam  Constitutional: She is oriented to person, place, and time. She appears well-developed and well-nourished. No distress.  HENT:  Right Ear: Tympanic membrane, external ear and ear canal normal.  Left Ear: Tympanic membrane, external ear and ear canal normal.  Nose: Mucosal edema and rhinorrhea present. No epistaxis. Right sinus exhibits no maxillary sinus tenderness and no frontal sinus tenderness. Left sinus exhibits no maxillary sinus tenderness and no frontal sinus tenderness.  Mouth/Throat: Uvula is midline and mucous membranes are normal. Posterior oropharyngeal edema and posterior oropharyngeal erythema present. No oropharyngeal exudate or tonsillar abscesses.  Eyes: Conjunctivae and EOM are normal.  Neck: Neck supple. No thyromegaly present.  Cardiovascular: Normal rate, regular rhythm, normal heart sounds and intact distal pulses.   No murmur heard. Pulmonary/Chest: Effort normal and breath sounds normal. No respiratory distress. She has no wheezes.  Musculoskeletal: Normal range of motion. She exhibits no edema  or tenderness.  Lymphadenopathy:    She has no cervical adenopathy.  Neurological: She is alert and oriented to person, place, and time. Coordination normal.  Skin: Skin is warm and dry. No rash noted. She is not diaphoretic.  Psychiatric: She has a normal mood and affect. Her behavior is normal.  Vitals reviewed.     Assessment & Plan:   Problem List Items Addressed This Visit      Respiratory   Seasonal allergic rhinitis - Primary   Relevant Medications   predniSONE (DELTASONE)  20 MG tablet       Follow up plan: Return if symptoms worsen or fail to improve.  Counseling provided for all of the vaccine components No orders of the defined types were placed in this encounter.    Caryl Pina, MD [facility] 09/12/2015, 10:35 AM

## 2015-09-14 ENCOUNTER — Telehealth: Payer: Self-pay | Admitting: Family Medicine

## 2015-09-14 ENCOUNTER — Telehealth: Payer: Self-pay | Admitting: Allergy and Immunology

## 2015-09-14 NOTE — Telephone Encounter (Signed)
Per Dettinger ok to 20mg  the remainder of the time.

## 2015-09-14 NOTE — Telephone Encounter (Signed)
Pt called and said that she wanted to talk with a nurse about the congesting and pressure in sinus also eyes are waterly . Think she may have something going on  Also she wanted to know want Dr.is on call. [phone].

## 2015-09-15 NOTE — Telephone Encounter (Signed)
Lm for pt to call us back  

## 2015-09-16 ENCOUNTER — Ambulatory Visit (INDEPENDENT_AMBULATORY_CARE_PROVIDER_SITE_OTHER): Payer: Medicare Other | Admitting: Family Medicine

## 2015-09-16 ENCOUNTER — Encounter: Payer: Self-pay | Admitting: Family Medicine

## 2015-09-16 ENCOUNTER — Telehealth: Payer: Self-pay | Admitting: Internal Medicine

## 2015-09-16 VITALS — BP 107/68 | HR 72 | Temp 97.5°F | Ht 69.0 in | Wt 105.8 lb

## 2015-09-16 DIAGNOSIS — H10023 Other mucopurulent conjunctivitis, bilateral: Secondary | ICD-10-CM

## 2015-09-16 MED ORDER — POLYMYXIN B-TRIMETHOPRIM 10000-0.1 UNIT/ML-% OP SOLN: 2.0000 [drp] | Freq: Four times a day (QID) | OPHTHALMIC | Status: DC

## 2015-09-16 NOTE — Patient Instructions (Signed)
Great to see you!  Start the eye drops and use then for 5 days  Bacterial Conjunctivitis Bacterial conjunctivitis, commonly called pink eye, is an inflammation of the clear membrane that covers the white part of the eye (conjunctiva). The inflammation can also happen on the underside of the eyelids. The blood vessels in the conjunctiva become inflamed, causing the eye to become red or pink. Bacterial conjunctivitis may spread easily from one eye to another and from person to person (contagious).  CAUSES  Bacterial conjunctivitis is caused by bacteria. The bacteria may come from your own skin, your upper respiratory tract, or from someone else with bacterial conjunctivitis. SYMPTOMS  The normally white color of the eye or the underside of the eyelid is usually pink or red. The pink eye is usually associated with irritation, tearing, and some sensitivity to light. Bacterial conjunctivitis is often associated with a thick, yellowish discharge from the eye. The discharge may turn into a crust on the eyelids overnight, which causes your eyelids to stick together. If a discharge is present, there may also be some blurred vision in the affected eye. DIAGNOSIS  Bacterial conjunctivitis is diagnosed by your caregiver through an eye exam and the symptoms that you report. Your caregiver looks for changes in the surface tissues of your eyes, which may point to the specific type of conjunctivitis. A sample of any discharge may be collected on a cotton-tip swab if you have a severe case of conjunctivitis, if your cornea is affected, or if you keep getting repeat infections that do not respond to treatment. The sample will be sent to a lab to see if the inflammation is caused by a bacterial infection and to see if the infection will respond to antibiotic medicines. TREATMENT   Bacterial conjunctivitis is treated with antibiotics. Antibiotic eyedrops are most often used. However, antibiotic ointments are also  available. Antibiotics pills are sometimes used. Artificial tears or eye washes may ease discomfort. HOME CARE INSTRUCTIONS   To ease discomfort, apply a cool, clean washcloth to your eye for 10-20 minutes, 3-4 times a day.  Gently wipe away any drainage from your eye with a warm, wet washcloth or a cotton ball.  Wash your hands often with soap and water. Use paper towels to dry your hands.  Do not share towels or washcloths. This may spread the infection.  Change or wash your pillowcase every day.  You should not use eye makeup until the infection is gone.  Do not operate machinery or drive if your vision is blurred.  Stop using contact lenses. Ask your caregiver how to sterilize or replace your contacts before using them again. This depends on the type of contact lenses that you use.  When applying medicine to the infected eye, do not touch the edge of your eyelid with the eyedrop bottle or ointment tube. SEEK IMMEDIATE MEDICAL CARE IF:   Your infection has not improved within 3 days after beginning treatment.  You had yellow discharge from your eye and it returns.  You have increased eye pain.  Your eye redness is spreading.  Your vision becomes blurred.  You have a fever or persistent symptoms for more than 2-3 days.  You have a fever and your symptoms suddenly get worse.  You have facial pain, redness, or swelling. MAKE SURE YOU:   Understand these instructions.  Will watch your condition.  Will get help right away if you are not doing well or get worse.   This information is  not intended to replace advice given to you by your health care provider. Make sure you discuss any questions you have with your health care provider.   Document Released: 03/14/2005 Document Revised: 04/04/2014 Document Reviewed: 08/15/2011 Elsevier Interactive Patient Education [employer].

## 2015-09-16 NOTE — Telephone Encounter (Signed)
Patient called and stated that she is having lots of nasal congestion, sinus pressure, ear pressure and her eyes are really red, itchy, watery and is taking Visine-A with no relief. She is also taking Prednisone and that doesn't seem to be helping her symptoms. Can we call her something else out. Please advise.

## 2015-09-16 NOTE — Telephone Encounter (Signed)
Patient already scheduled appointment with her PCP

## 2015-09-16 NOTE — Telephone Encounter (Signed)
Please informed patient she needs to be seen in clinic. Please make a appointment for her today

## 2015-09-16 NOTE — Telephone Encounter (Signed)
Spoke with pt. She states that she is having watery eyes and ear pain. Her PCP prescribed prednisone for her. Feels like she is having a reaction to this. Advised her that she will need to contact her PCP about this matter. She agreed and verbalized understanding. Nothing further was needed.

## 2015-09-16 NOTE — Progress Notes (Signed)
   HPI  Patient presents today here with red itchy eyes.  She explains over the last 3 days she's had red itchy eyes. She states that initially she had blurred vision which is improving. She has some mild eye discomfort but no severe pain.  She does not have an ophthalmologist.  She states that she has copious eye draining and matting, she has good improvement with warm compresses, it's worse first thing in the morning. She's also got cough and cold symptoms  She denies any shortness of breath, fever, chills, or chest pain.  PMH: Smoking status noted ROS: Per HPI  Objective: BP 107/68 mmHg  Pulse 72  Temp(Src) 97.5 F (36.4 C) (Oral)  Ht 5\' 9"  (1.753 m)  Wt 105 lb 12.8 oz (47.991 kg)  BMI 15.62 kg/m2 Gen: NAD, alert, cooperative with exam HEENT: NCAT, lateral conjunctival injection, PERRLA, EOMI, normal vision grossly with a magazine at 16 inches non-corrected CV: RRR, good S1/S2, no murmur Resp: CTABL, no wheezes, non-labored Ext: No edema, warm Neuro: Alert and oriented, No gross deficits  Assessment and plan:  # [place] Treating with Polytrim drops Return to clinic with any worsening, new concerns, or failure to improve. Outlined in detail reasons to return including eye pain, decreased vision, or worsening symptoms. Unlikely to be clot, and bilateral eye symptoms, matting, very mild discomfort with outpatient of the eyeball, and normal vision grossly which is improving   Laroy Apple, MD [facility] 09/16/2015, 5:32 PM

## 2015-09-18 ENCOUNTER — Telehealth: Payer: Self-pay

## 2015-09-18 ENCOUNTER — Telehealth: Payer: Self-pay | Admitting: Family Medicine

## 2015-09-18 NOTE — Telephone Encounter (Signed)
She needs to be seen.   Laroy Apple, MD [facility] 09/18/2015, 4:04 PM

## 2015-09-18 NOTE — Telephone Encounter (Signed)
Spoke with pt, she is making an appoinment for the next avaliable

## 2015-09-18 NOTE — Telephone Encounter (Signed)
Pt aware and has an appt with her ENT Monday.

## 2015-09-18 NOTE — Telephone Encounter (Signed)
Please inform patient that we have seen her since 2016 and she need to see someone in [place]. Today?

## 2015-09-18 NOTE — Telephone Encounter (Signed)
PLEASE ADVISE.

## 2015-09-18 NOTE — Telephone Encounter (Signed)
Patient of Dr. Neldon Mc .Patient is having a lot of pressure in her ears. She has been taking sudafed 30 Mg Tabs and there hasn't been any relief. She is wondering is there is any ear drops or anything over the counter she can take.  Please Advise

## 2015-09-21 ENCOUNTER — Encounter: Payer: Self-pay | Admitting: Physician Assistant

## 2015-09-21 ENCOUNTER — Ambulatory Visit: Payer: Medicare Other | Admitting: Allergy and Immunology

## 2015-09-21 ENCOUNTER — Ambulatory Visit: Payer: Medicare Other | Admitting: Family Medicine

## 2015-09-21 ENCOUNTER — Ambulatory Visit (INDEPENDENT_AMBULATORY_CARE_PROVIDER_SITE_OTHER): Payer: Medicare Other | Admitting: Physician Assistant

## 2015-09-21 VITALS — BP 94/66 | HR 84 | Temp 97.1°F | Ht 69.0 in | Wt 105.0 lb

## 2015-09-21 DIAGNOSIS — H109 Unspecified conjunctivitis: Secondary | ICD-10-CM

## 2015-09-21 NOTE — Progress Notes (Signed)
Subjective:     Patient ID: Kristy Garza, female   DOB: 1950-11-28, 65 y.o.   MRN: [id number]  HPI Pt with bilat blurriness following administration of drops for recent infection Pt with multiple drug allergies  Review of Systems No pain or drainage from the eyes Still with some sl redness    Objective:   Physical Exam PERRLA EOMI Very sl injection bilat R>L No lid edema No pre-aur node Vision test done aw/o glasses today       Assessment:     1. Bilateral conjunctivitis        Plan:     Given long list of med allergies question possible sensitivity Hold further use If sx worsen f/u

## 2015-09-21 NOTE — Patient Instructions (Signed)

## 2015-09-22 ENCOUNTER — Ambulatory Visit: Payer: Medicare Other | Admitting: Allergy and Immunology

## 2015-10-01 ENCOUNTER — Other Ambulatory Visit: Payer: Self-pay | Admitting: Internal Medicine

## 2015-10-01 ENCOUNTER — Telehealth: Payer: Self-pay | Admitting: Family Medicine

## 2015-10-01 NOTE — Telephone Encounter (Signed)
CY Please advise. Rx not on patients med list. Thanks.

## 2015-10-01 NOTE — Telephone Encounter (Signed)
Spoke with patient, she is still having some blurry vision, she has an appointment scheduled tomorrow to see an eye doctor.  She wanted to know if we felt she should see [name] today or just wait until tomorrow and see the eye doctor.  I advised the patient to see her eye doctor tomorrow and if her symptoms did not improve with their treatment to let us know.

## 2015-10-01 NOTE — Telephone Encounter (Signed)
Ok to refill 

## 2015-10-02 ENCOUNTER — Telehealth: Payer: Self-pay | Admitting: Family Medicine

## 2015-10-02 DIAGNOSIS — H52223 Regular astigmatism, bilateral: Secondary | ICD-10-CM | POA: Diagnosis not present

## 2015-10-02 DIAGNOSIS — H5213 Myopia, bilateral: Secondary | ICD-10-CM | POA: Diagnosis not present

## 2015-10-02 DIAGNOSIS — H43813 Vitreous degeneration, bilateral: Secondary | ICD-10-CM | POA: Diagnosis not present

## 2015-10-02 DIAGNOSIS — H524 Presbyopia: Secondary | ICD-10-CM | POA: Diagnosis not present

## 2015-10-02 NOTE — Telephone Encounter (Signed)
[facility] to x-ray 

## 2015-10-02 NOTE — Telephone Encounter (Signed)
Spoke with pt and let her know that as long as the office she wanted to go to did not require a referral, she could make her own appointment; Pt unsure of who she wants to see

## 2015-10-12 ENCOUNTER — Encounter: Payer: Self-pay | Admitting: Family Medicine

## 2015-10-12 ENCOUNTER — Ambulatory Visit (INDEPENDENT_AMBULATORY_CARE_PROVIDER_SITE_OTHER): Payer: Medicare Other | Admitting: Family Medicine

## 2015-10-12 VITALS — BP 119/74 | HR 75 | Temp 98.3°F | Ht 69.0 in | Wt 104.4 lb

## 2015-10-12 DIAGNOSIS — J321 Chronic frontal sinusitis: Secondary | ICD-10-CM | POA: Diagnosis not present

## 2015-10-12 NOTE — Progress Notes (Signed)
BP 119/74 mmHg  Pulse 75  Temp(Src) 98.3 F (36.8 C) (Oral)  Ht 5\' 9"  (1.753 m)  Wt 104 lb 6.4 oz (47.356 kg)  BMI 15.41 kg/m2   Subjective:    Patient ID: Kristy Garza Kitchen, female    DOB: 1950/07/09, 65 y.o.   MRN: [id number]  HPI: Kristy Garza is a 65 y.o. female presenting on 10/12/2015 for Cough and Headache   HPI Chronic cough and seasonal allergies and congestion Patient comes in today because she has been having chronic cough and congestion and seasonal allergies is been going on for the past few years. She'll get some relief with steroids and then it comes right back. She has seen an allergist had allergy testing before and they recommended allergy shots but she never did them. She denies any fevers or chills or shortness of breath or wheezing. She does have asthma inhalers and has been using them but they have not fully been helping. She mainly complains of the cough that she can't get rid of and that is nonproductive.  Relevant past medical, surgical, family and social history reviewed and updated as indicated. Interim medical history since our last visit reviewed. Allergies and medications reviewed and updated.  Review of Systems  Constitutional: Negative for fever and chills.  HENT: Positive for postnasal drip, sinus pressure, sneezing and sore throat. Negative for congestion, ear discharge, ear pain and rhinorrhea.   Eyes: Negative for pain, redness and visual disturbance.  Respiratory: Positive for cough. Negative for chest tightness and shortness of breath.   Cardiovascular: Negative for chest pain and leg swelling.  Genitourinary: Negative for dysuria and difficulty urinating.  Musculoskeletal: Negative for back pain and gait problem.  Skin: Negative for rash.  Neurological: Negative for light-headedness and headaches.  Psychiatric/Behavioral: Negative for behavioral problems and agitation.  All other systems reviewed and are negative.   Per HPI unless  specifically indicated above     Medication List       This list is accurate as of: 10/12/15  4:16 PM.  Always use your most recent med list.               albuterol (2.5 MG/3ML) 0.083% nebulizer solution  Commonly known as:  PROVENTIL  Take 3 mLs (2.5 mg total) by nebulization every 6 (six) hours as needed for wheezing or shortness of breath.     ALPRAZolam 0.5 MG tablet  Commonly known as:  XANAX  Take 1 tablet (0.5 mg total) by mouth 2 (two) times daily.     aspirin-acetaminophen-caffeine 250-250-65 MG tablet  Commonly known as:  EXCEDRIN MIGRAINE  Take 1 tablet by mouth every 6 (six) hours as needed for headache.     azelastine 0.1 % nasal spray  Commonly known as:  ASTELIN  Place 2 sprays into both nostrils 2 (two) times daily. Use in each nostril as directed     b complex vitamins capsule  Take 1 capsule by mouth daily.     benzonatate 200 MG capsule  Commonly known as:  TESSALON  TAKE 1 CAPSULE THREE TIMES A DAY AS NEEDED FOR COUGH     calcium citrate-vitamin D 315-200 MG-UNIT tablet  Commonly known as:  CITRACAL+D  Take 1 tablet by mouth. Reported on 07/27/2015     DHEA PO  Take 5 mg by mouth daily. Reported on 08/20/2015     diclofenac sodium 1 % Gel  Commonly known as:  VOLTAREN  Apply 1 g topically.  escitalopram 10 MG tablet  Commonly known as:  LEXAPRO  Take 1 tablet (10 mg total) by mouth daily.     fexofenadine 60 MG tablet  Commonly known as:  ALLEGRA  Take 60 mg by mouth 2 (two) times daily.     fluticasone 50 MCG/ACT nasal spray  Commonly known as:  FLONASE  Place 2 sprays into both nostrils daily.     IODINE (KELP) PO  Take 300 mcg by mouth daily.     lidocaine 2 % jelly  Commonly known as:  XYLOCAINE  Apply topically as needed.     LINZESS 290 MCG Caps capsule  Generic drug:  linaclotide  TAKE 1 CAPSULE (290 MCG TOTAL) BY MOUTH DAILY.     Magnesium Oxide 400 (240 Mg) MG Tabs  Take 1 tablet by mouth daily. Reported on 09/12/2015       multivitamin tablet  Take 1 tablet by mouth daily.     pantoprazole 40 MG tablet  Commonly known as:  PROTONIX  Take 40 mg by mouth daily.     peppermint oil liquid  by Does not apply route as needed.     ranitidine 150 MG tablet  Commonly known as:  ZANTAC  Take 150 mg by mouth 2 (two) times daily.     SYMBICORT 80-4.5 MCG/ACT inhaler  Generic drug:  budesonide-formoterol  INHALE 2 PUFFS INTO THE LUNGS 2 (TWO) TIMES DAILY.     VITAMIN D PO  Take 4,000 Units by mouth daily.           Objective:    BP 119/74 mmHg  Pulse 75  Temp(Src) 98.3 F (36.8 C) (Oral)  Ht 5\' 9"  (1.753 m)  Wt 104 lb 6.4 oz (47.356 kg)  BMI 15.41 kg/m2  Wt Readings from Last 3 Encounters:  10/12/15 104 lb 6.4 oz (47.356 kg)  09/21/15 105 lb (47.628 kg)  09/16/15 105 lb 12.8 oz (47.991 kg)    Physical Exam  Constitutional: She is oriented to person, place, and time. She appears well-developed and well-nourished. No distress.  HENT:  Right Ear: Tympanic membrane, external ear and ear canal normal.  Left Ear: Tympanic membrane, external ear and ear canal normal.  Nose: Mucosal edema and rhinorrhea present. No epistaxis. Right sinus exhibits no maxillary sinus tenderness and no frontal sinus tenderness. Left sinus exhibits no maxillary sinus tenderness and no frontal sinus tenderness.  Mouth/Throat: Uvula is midline and mucous membranes are normal. Posterior oropharyngeal edema and posterior oropharyngeal erythema present. No oropharyngeal exudate or tonsillar abscesses.  Eyes: Conjunctivae and EOM are normal. Pupils are equal, round, and reactive to light.  Cardiovascular: Normal rate, regular rhythm, normal heart sounds and intact distal pulses.   No murmur heard. Pulmonary/Chest: Effort normal and breath sounds normal. No respiratory distress. She has no wheezes.  Musculoskeletal: Normal range of motion. She exhibits no edema or tenderness.  Neurological: She is alert and oriented to person,  place, and time. Coordination normal.  Skin: Skin is warm and dry. No rash noted. She is not diaphoretic.  Psychiatric: She has a normal mood and affect. Her behavior is normal.  Nursing note and vitals reviewed.     Assessment & Plan:       Problem List Items Addressed This Visit    None    Visit Diagnoses    Chronic frontal sinusitis    -  Primary    Recommended for patient to go see an allergist, she already has my has not seen  in a while, she will call them up and get an appointment        Follow up plan: Return if symptoms worsen or fail to improve.  Counseling provided for all of the vaccine components No orders of the defined types were placed in this encounter.    Caryl Pina, MD [facility] 10/12/2015, 4:16 PM

## 2015-10-14 ENCOUNTER — Telehealth: Payer: Self-pay | Admitting: Internal Medicine

## 2015-10-14 NOTE — Telephone Encounter (Signed)
Called spoke with pt. She c/o occas prod cough (clear), HA, scratchy throat, some chest tightness, slight PND/nasal cong. She took tess pearls but did not help so she changed to mucinex DM. Also using flonase/astelin, proair, symbicort. She also read that symbicort can be bad for patient's with glaucoma and cataracts. She reports her eye doc advised her that her cataracts is getting worse. Pt requesting recs please advise Dr. Annamaria Boots thanks  Allergies  Allergen Reactions  . Augmentin [Amoxicillin-Pot Clavulanate] Diarrhea  . Avelox [Moxifloxacin Hcl In Nacl] Other (See Comments)    Tremors   . Biaxin [Clarithromycin]     Unsure of reaction  . Cedax [Ceftibuten] Other (See Comments)    tremors  . Ciprofloxacin Other (See Comments)    Blurred vision  . Clindamycin/Lincomycin     tachycardia  . Gabapentin     Constipation  . Levofloxacin Other (See Comments)    Feels dehydrated  . Lyrica [Pregabalin]     Drowsiness, dry mouth  . Singulair [Montelukast Sodium] Other (See Comments)    Numbness and tingling in hand.  . Sulfonamide Derivatives Other (See Comments)    Gi upset     Current Outpatient Prescriptions on File Prior to Visit  Medication Sig Dispense Refill  . albuterol (PROVENTIL) (2.5 MG/3ML) 0.083% nebulizer solution Take 3 mLs (2.5 mg total) by nebulization every 6 (six) hours as needed for wheezing or shortness of breath. 150 mL 1  . ALPRAZolam (XANAX) 0.5 MG tablet Take 1 tablet (0.5 mg total) by mouth 2 (two) times daily. 60 tablet 1  . aspirin-acetaminophen-caffeine (EXCEDRIN MIGRAINE) [id number] MG per tablet Take 1 tablet by mouth every 6 (six) hours as needed for headache.    [name] azelastine (ASTELIN) 0.1 % nasal spray Place 2 sprays into both nostrils 2 (two) times daily. Use in each nostril as directed 30 mL 12  . b complex vitamins capsule Take 1 capsule by mouth daily.    . benzonatate (TESSALON) 200 MG capsule TAKE 1 CAPSULE THREE TIMES A DAY AS NEEDED FOR COUGH 30  capsule 1  . calcium citrate-vitamin D (CITRACAL+D) 315-200 MG-UNIT per tablet Take 1 tablet by mouth. Reported on 07/27/2015    . Cholecalciferol (VITAMIN D PO) Take 4,000 Units by mouth daily.     . diclofenac sodium (VOLTAREN) 1 % GEL Apply 1 g topically.     [name] escitalopram (LEXAPRO) 10 MG tablet Take 1 tablet (10 mg total) by mouth daily. (Patient not taking: Reported on 10/12/2015) 30 tablet 1  . fexofenadine (ALLEGRA) 60 MG tablet Take 60 mg by mouth 2 (two) times daily.    . fluticasone (FLONASE) 50 MCG/ACT nasal spray Place 2 sprays into both nostrils daily. 16 g 11  . IODINE, KELP, PO Take 300 mcg by mouth daily.    [name] lidocaine (XYLOCAINE) 2 % jelly Apply topically as needed.    [name] LINZESS 290 MCG CAPS capsule TAKE 1 CAPSULE (290 MCG TOTAL) BY MOUTH DAILY. 30 capsule 4  . Magnesium Oxide 400 (240 MG) MG TABS Take 1 tablet by mouth daily. Reported on 09/12/2015    . Multiple Vitamin (MULTIVITAMIN) tablet Take 1 tablet by mouth daily.    . Nutritional Supplements (DHEA PO) Take 5 mg by mouth daily. Reported on 08/20/2015    . pantoprazole (PROTONIX) 40 MG tablet Take 40 mg by mouth daily.    . peppermint oil liquid by Does not apply route as needed.    . ranitidine (ZANTAC) 150 MG tablet Take  150 mg by mouth 2 (two) times daily.    . SYMBICORT 80-4.5 MCG/ACT inhaler INHALE 2 PUFFS INTO THE LUNGS 2 (TWO) TIMES DAILY. 10.2 Inhaler 2   Current Facility-Administered Medications on File Prior to Visit  Medication Dose Route Frequency Provider Last Rate Last Dose  . bupivacaine (MARCAINE) 0.5 % 10 mL, triamcinolone acetonide (KENALOG-40) 40 mg injection   Subcutaneous Once Carolan Clines, MD      . bupivacaine (MARCAINE) 0.5 % 15 mL, phenazopyridine (PYRIDIUM) 400 mg bladder mixture   Bladder Instillation Once Carolan Clines, MD      . cyanocobalamin ((VITAMIN B-12)) injection 1,000 mcg  1,000 mcg Intramuscular Q30 days [name], MD   1,000 mcg at 06/16/14 1324

## 2015-10-14 NOTE — Telephone Encounter (Signed)
Called spoke with pt and made aware of recs below. She reports she will hold on the bevespi for now and just stick with what she has. She will call if she worsens. nothing further needed

## 2015-10-14 NOTE — Telephone Encounter (Signed)
We can try treating this as a chronic bronchitis - offer sample Bevespi inhaler  Inhale 2 puffs twice daily                   Try this instead of Symbicort to see if she likes it any better.  Inhaled steroids are basic treatment for most patients with asthma. There may be a small increased risk of cataracts, but most people get cataracts any way, as part of normal aging.

## 2015-10-16 ENCOUNTER — Encounter: Payer: Self-pay | Admitting: Allergy and Immunology

## 2015-10-16 ENCOUNTER — Ambulatory Visit (INDEPENDENT_AMBULATORY_CARE_PROVIDER_SITE_OTHER): Payer: Medicare Other | Admitting: Allergy and Immunology

## 2015-10-16 VITALS — BP 112/64 | HR 68 | Temp 98.3°F | Resp 16 | Ht 69.0 in | Wt 104.8 lb

## 2015-10-16 DIAGNOSIS — R059 Cough, unspecified: Secondary | ICD-10-CM

## 2015-10-16 DIAGNOSIS — R05 Cough: Secondary | ICD-10-CM | POA: Diagnosis not present

## 2015-10-16 MED ORDER — ALBUTEROL SULFATE HFA 108 (90 BASE) MCG/ACT IN AERS: 2.0000 | INHALATION_SPRAY | RESPIRATORY_TRACT | Status: DC | PRN

## 2015-10-16 NOTE — Patient Instructions (Signed)
Take Home Sheet  1. Keep appointments with GI and ENT as scheduled, review GE Reflux and endoscopy testing.   2. Antihistamine: Allegra 180mg  by mouth once daily for runny nose or itching.   3. Nasal Spray: Rhinocort AQ 1 spray(s) each nostril once daily for stuffy nose or drainage each morning.       Azelastine 1-2 sprays twice daily as needed.   4. Inhalers:  Rescue: ProAir 2 puffs every 4 hours as needed for cough or wheeze.       -May use 2 puffs 10-20 minutes prior to exercise.   Preventative: QVAR 53mcg one puffs once daily (Rinse, gargle, and spit out after use).   5. Other: Saline nasal wash each evening and as needed.   6. Follow up Visit: 2 months or sooner if needed.   Websites that have reliable Patient information: 1. American Academy of Asthma, Allergy, & Immunology: www.aaaai.org 2. Food Allergy [employer]: www.foodallergy.org 3. Mothers of Asthmatics: www.aanma.org 4. [place]: DiningCalendar.de 5. American College of Allergy, Asthma, & Immunology: https://robertson.info/ or www.acaai.org

## 2015-10-16 NOTE — Progress Notes (Signed)
FOLLOW UP NOTE  RE: ELAYSHA LUCIANO MRN: [id number] DOB: Jan 24, 1951 [facility] [place] [address] [place] [address] Date of Office Visit: 10/16/2015  Subjective:  Kristy Garza is a 65 y.o. female who presents today for Cough and Wheezing  Assessment:   1. Cough, appears multifactorial etiology-- previous concern for mild Asthma.   2.      Mild allergic rhinitis, suspected intermittent postnasal drip. 3.      Significant habitual throat clearing. 4.      History of GE reflux and associated laryngopharyngreflux. 5.      Complex past medical history--including IBS, decreased appetite and weight loss. Plan:   Meds ordered this encounter  Medications  . albuterol (PROAIR HFA) 108 (90 Base) MCG/ACT inhaler    Sig: Inhale 2 puffs into the lungs every 4 (four) hours as needed for wheezing or shortness of breath.    Dispense:  1 Inhaler    Refill:  1  1. Keep appointments with GI and ENT as scheduled, review GE Reflux and endoscopy. 2. Antihistamine: Allegra 180mg  by mouth once daily for runny nose or itching. 3. Nasal Spray: Rhinocort AQ 1 spray(s) each nostril once daily for stuffy nose or drainage each morning---STOP Flonase and continue Azelastine 1-2 sprays twice daily as needed.  4. Inhalers:  With spacer  Rescue: ProAir 2 puffs every 4 hours as needed for cough or wheeze.       -May use 2 puffs 10-20 minutes prior to exercise.  Preventative: QVAR 61mcg one puffs once daily (Rinse, gargle, and spit out after use)--sample given. 5. Other: Saline nasal wash each evening and as needed. 6. Follow up Visit: 2 months or sooner if needed.  HPI: Terasa returns to the office reporting cough and intermittent congestion, though she has not been seen in office since May 2015, which she reports has been more prominent in the last year.  It is unclear why lack of follow-up, though it appears she has had several phone calls to the office and cancelled  appointments and recent follow-up/evaluation with Dr. Keturah Barre.   Describes recent nasal congestion greater throat clearing with clear mucus production in the last week. She has used Mucinex DM but has been off inhalers given her specific concern for cataracts.  Denies rhinorrhea, sneezing or watery eyes. She does note more dry eyes without discolored drainage, fever, sore throat, or sinus pressure headache.  She reports difficulty with TMJ and correlates this to recurring intermittent jaw/headache.  She is following with a chiropractor but has not seen Neurology.  Denies ED or urgent care visits, prednisone or antibiotic courses. Reports sleep and activity are normal.  She has had recent weight loss due to decreased appetite as related to constipation/bloating difficulties.  Onia has a current medication list which includes the following prescription(s): albuterol, alprazolam, aspirin-acetaminophen-caffeine, azelastine, b complex vitamins, calcium citrate-vitamin d, cholecalciferol, diclofenac sodium, escitalopram, fexofenadine, fluticasone, lidocaine, linzess, multivitamin, pantoprazole, ranitidine, trimethoprim-polymyxin b.  Drug Allergies: Allergies  Allergen Reactions  . Augmentin [Amoxicillin-Pot Clavulanate] Diarrhea  . Avelox [Moxifloxacin Hcl In Nacl] Other (See Comments)    Tremors   . Biaxin [Clarithromycin]     Unsure of reaction  . Cedax [Ceftibuten] Other (See Comments)    tremors  . Ciprofloxacin Other (See Comments)    Blurred vision  . Clindamycin/Lincomycin     tachycardia  . Gabapentin     Constipation  . Levofloxacin Other (See Comments)    Feels  dehydrated  . Lyrica [Pregabalin]     Drowsiness, dry mouth  . Singulair [Montelukast Sodium] Other (See Comments)    Numbness and tingling in hand.  . Sulfonamide Derivatives Other (See Comments)    Gi upset   Objective:   Filed Vitals:   10/16/15 1529  BP: 112/64  Pulse: 68  Temp: 98.3 F (36.8 C)  Resp: 16    SpO2 Readings from Last 1 Encounters:  10/16/15 98%   Physical Exam  Constitutional: She is well-developed, well-nourished, and in no distress.  HENT:  Head: Atraumatic.  Right Ear: Tympanic membrane and ear canal normal.  Left Ear: Tympanic membrane and ear canal normal.  Nose: Mucosal edema present. No rhinorrhea. No epistaxis.  Mouth/Throat: Oropharynx is clear and moist and mucous membranes are normal. No oropharyngeal exudate, posterior oropharyngeal edema or posterior oropharyngeal erythema.  Neck: Neck supple.  Cardiovascular: Normal rate, S1 normal and S2 normal.   No murmur heard. Pulmonary/Chest: Effort normal. She has no wheezes. She has no rhonchi. She has no rales.  Lymphadenopathy:    She has no cervical adenopathy.   Diagnostics: Spirometry:  FVC 3.70--107%, FEV1 2.62--97%.    Lloyde Ludlam M. Ishmael Holter, MD  cc: Kenn File, MD

## 2015-10-19 DIAGNOSIS — K219 Gastro-esophageal reflux disease without esophagitis: Secondary | ICD-10-CM | POA: Diagnosis not present

## 2015-10-19 DIAGNOSIS — K5904 Chronic idiopathic constipation: Secondary | ICD-10-CM | POA: Diagnosis not present

## 2015-10-22 ENCOUNTER — Ambulatory Visit: Payer: Medicare Other | Admitting: Allergy & Immunology

## 2015-10-26 ENCOUNTER — Encounter: Payer: Self-pay | Admitting: *Deleted

## 2015-10-26 DIAGNOSIS — M26629 Arthralgia of temporomandibular joint, unspecified side: Secondary | ICD-10-CM | POA: Diagnosis not present

## 2015-11-02 ENCOUNTER — Telehealth: Payer: Self-pay | Admitting: Family Medicine

## 2015-11-02 NOTE — Telephone Encounter (Signed)
Patient has been to integrative health and has blood test which tested all her allergies.  Patient saw nutritionist who recommended she avoid chicken, [place], pepper, lemons, vinegar and cow's milk.  She was also advised to stop all supplements but patient was not sure if they should include her MVI and iodine + kelp.  Recommended that she follow up with nutritionist she met with but until she is able to speak with her I could recommend she stop all supplements.

## 2015-11-06 ENCOUNTER — Telehealth: Payer: Self-pay | Admitting: *Deleted

## 2015-11-06 ENCOUNTER — Ambulatory Visit (INDEPENDENT_AMBULATORY_CARE_PROVIDER_SITE_OTHER): Payer: Medicare Other | Admitting: Family

## 2015-11-06 ENCOUNTER — Encounter: Payer: Self-pay | Admitting: Family

## 2015-11-06 VITALS — BP 95/62 | HR 78 | Temp 97.6°F | Ht 69.0 in | Wt 101.0 lb

## 2015-11-06 DIAGNOSIS — M26623 Arthralgia of bilateral temporomandibular joint: Secondary | ICD-10-CM | POA: Diagnosis not present

## 2015-11-06 MED ORDER — DICLOFENAC POTASSIUM 50 MG PO TABS: 50.0000 mg | ORAL_TABLET | Freq: Two times a day (BID) | ORAL | 1 refills | Status: DC

## 2015-11-06 MED ORDER — DICLOFENAC POTASSIUM(MIGRAINE) 50 MG PO PACK: 50.0000 mg | PACK | Freq: Two times a day (BID) | ORAL | 0 refills | Status: DC

## 2015-11-06 NOTE — Progress Notes (Signed)
   Subjective:    Patient ID: Kristy Garza, female    DOB: 1950-04-11, 65 y.o.   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 PT states she is having constant pain 4 out 10. PT had acupuncture yesterday with mild relief. PT states she nutritionists and was tested for sensitivity and allergies to food. PT was told to stop her motrin related to the sensitivity. Pt also wears a mouth guard every night to avoid jaw tightness. PT has seen a chiropractor, dentist with mild relief.    Review of Systems  HENT: Positive for sore throat. Negative for trouble swallowing.   Respiratory: Positive for cough. Negative for shortness of breath.   Gastrointestinal: Positive for constipation, diarrhea and nausea.       Objective:   Physical Exam  Constitutional: She is oriented to person, place, and time. She appears well-developed. She appears cachectic. No distress.  HENT:  Head: Normocephalic and atraumatic.  Pain in bilateral jaw with opening and closing  Eyes: Pupils are equal, round, and reactive to light.  Neck: Normal range of motion. Neck supple. No thyromegaly present.  Cardiovascular: Normal rate, regular rhythm, normal heart sounds and intact distal pulses.   No murmur heard. Pulmonary/Chest: Effort normal and breath sounds normal. No respiratory distress. She has no wheezes.  Intermittent nonproductive cough     Abdominal: Soft. Bowel sounds are normal. She exhibits no distension. There is no tenderness.  Musculoskeletal: Normal range of motion. She exhibits no edema or tenderness.  Neurological: She is alert and oriented to person, place, and time.  Skin: Skin is warm and dry.  Psychiatric: She has a normal mood and affect. Her behavior is normal. Judgment and thought content normal.  Vitals reviewed.     BP 95/62   Pulse 78   Temp 97.6 F (36.4 C) (Oral)   Ht 5\' 9"  (1.753 m)   Wt 101 lb (45.8 kg)   BMI 14.92 kg/m      Assessment & Plan:  1.  Bilateral temporomandibular joint pain -Rest -Continue to avoid foods that require a lot of chewing -Continue with mouth guard -Take diclofenac BID with food for next 10-14 days RTO prn - Ambulatory referral to Oral Maxillofacial Surgery - Diclofenac Potassium 50 MG PACK; Take 50 mg by mouth 2 (two) times daily.  Dispense: 30 each; Refill: 0  Evelina Dun, FNP

## 2015-11-06 NOTE — Telephone Encounter (Signed)
Reorder diclofenac 50 mg that was not in pack? Per EPIC it is covered by insurance

## 2015-11-06 NOTE — Telephone Encounter (Signed)
Ins will not cover the diclofenac packet please advise

## 2015-11-06 NOTE — Patient Instructions (Signed)

## 2015-11-09 NOTE — Telephone Encounter (Signed)
Patient aware.

## 2015-11-10 ENCOUNTER — Telehealth: Payer: Self-pay | Admitting: Family

## 2015-11-10 NOTE — Telephone Encounter (Signed)
Patient was started on Diclofenac and would like to know if she can take 1/2 a pill instead of whole pill and do you think this will help her joint pain/TMJ?

## 2015-11-10 NOTE — Telephone Encounter (Signed)
Patient aware.

## 2015-11-10 NOTE — Telephone Encounter (Signed)
Yes this is fine. It should help

## 2015-11-23 ENCOUNTER — Encounter: Payer: Self-pay | Admitting: Family Medicine

## 2015-11-23 DIAGNOSIS — K317 Polyp of stomach and duodenum: Secondary | ICD-10-CM | POA: Diagnosis not present

## 2015-11-23 DIAGNOSIS — R1013 Epigastric pain: Secondary | ICD-10-CM | POA: Diagnosis not present

## 2015-11-23 DIAGNOSIS — R1084 Generalized abdominal pain: Secondary | ICD-10-CM | POA: Diagnosis not present

## 2015-11-23 DIAGNOSIS — K3189 Other diseases of stomach and duodenum: Secondary | ICD-10-CM | POA: Diagnosis not present

## 2015-11-25 ENCOUNTER — Other Ambulatory Visit: Payer: Self-pay | Admitting: Family Medicine

## 2015-11-26 ENCOUNTER — Telehealth: Payer: Self-pay

## 2015-11-26 DIAGNOSIS — Z0100 Encounter for examination of eyes and vision without abnormal findings: Secondary | ICD-10-CM

## 2015-11-26 NOTE — Telephone Encounter (Signed)
Referral placed.

## 2015-12-01 ENCOUNTER — Telehealth: Payer: Self-pay | Admitting: Family

## 2015-12-01 ENCOUNTER — Telehealth: Payer: Self-pay

## 2015-12-01 NOTE — Telephone Encounter (Signed)
Sent to [facility] Covering [employer]

## 2015-12-02 DIAGNOSIS — H04123 Dry eye syndrome of bilateral lacrimal glands: Secondary | ICD-10-CM | POA: Diagnosis not present

## 2015-12-07 ENCOUNTER — Other Ambulatory Visit: Payer: Self-pay

## 2015-12-08 ENCOUNTER — Ambulatory Visit (INDEPENDENT_AMBULATORY_CARE_PROVIDER_SITE_OTHER): Payer: Medicare Other | Admitting: Pharmacist

## 2015-12-08 ENCOUNTER — Ambulatory Visit: Payer: Medicare Other | Admitting: Family Medicine

## 2015-12-08 DIAGNOSIS — R5382 Chronic fatigue, unspecified: Secondary | ICD-10-CM | POA: Diagnosis not present

## 2015-12-08 DIAGNOSIS — F411 Generalized anxiety disorder: Secondary | ICD-10-CM | POA: Diagnosis not present

## 2015-12-08 DIAGNOSIS — Z79899 Other long term (current) drug therapy: Secondary | ICD-10-CM | POA: Diagnosis not present

## 2015-12-08 NOTE — Telephone Encounter (Signed)
x

## 2015-12-08 NOTE — Patient Instructions (Signed)
Checking iodine, B12, C-Reactive protein and hemoglobin.   Remain off all antihistamines and nasal spray (due to dry eyes)  Start Lexapro  Restart pantoprazole to see if helps with cough.   Continue with diclofenac 1/2 tablet once or twice a day.  Recommend 2 servings of fish per week - no need for fish oil supplement

## 2015-12-08 NOTE — Progress Notes (Signed)
Patient ID: Kristy Garza, female   DOB: 02-Dec-1950, 65 y.o.   MRN: [id number] CC:  Iodine level and medication management  HPI:  Patient comes today requesting recheck iodine level.  She was seeing an integrative physician about 1-2 years ago and she was noted to have low iodine from labs that office has requested.  The last time I saw Kristy Garza she has wanted this level rechecked.  Iodine was found to still be a little low.  She started OTC iodine supplementation for about 1-2 months but she has since stopped.   She also has a long list of other complaints:  Continued TMJ pain - has been prescribed diclofenac and referred to oral surgeon.  Has not started diclofenac yet  Dry eyes - has seen optometrist and opthalmologist.  Has been prescribed Reed Pandy which she has used for about 3 days.  She was told could take 2 weeks to see if its going to work.   Anxiety and insomnia - she has been prescribed lexapro but has not started.  She was unsure of side effect to expect. She does occasionally take alprazolam to help with sleep and also benadryl but the opthalmologist recommended she stop benadryl because it could be worsening dry eye symptoms.  GERD - She stopped pantoprazole because she was concerned about its effects on absorption of B12, magnesium and other nutrients.  She is taking ranitidine but over the last week she has developed a cough.  Her acupuncturist suggested cough might be from GERD.   Current medications include: Outpatient Encounter Prescriptions as of 12/08/2015  Medication Sig Note  . albuterol (PROAIR HFA) 108 (90 Base) MCG/ACT inhaler Inhale 2 puffs into the lungs every 4 (four) hours as needed for wheezing or shortness of breath.   [name] albuterol (PROVENTIL) (2.5 MG/3ML) 0.083% nebulizer solution Take 3 mLs (2.5 mg total) by nebulization every 6 (six) hours as needed for wheezing or shortness of breath.   . ALPRAZolam (XANAX) 0.5 MG tablet Take 1 tablet (0.5 mg total) by mouth 2 (two)  times daily.   [name] b complex vitamins capsule Take 1 capsule by mouth daily.   . calcium citrate-vitamin D (CITRACAL+D) 315-200 MG-UNIT per tablet Take 1 tablet by mouth. Reported on 07/27/2015 12/07/2013: Received from: [facility]  . Cholecalciferol (VITAMIN D PO) Take 4,000 Units by mouth daily.    [name] dextromethorphan (DELSYM) 30 MG/5ML liquid Take by mouth. 12/09/2015: Received from: [facility]: Take by mouth.  . diclofenac (CATAFLAM) 50 MG tablet Take 1 tablet (50 mg total) by mouth 2 (two) times daily.   [name] escitalopram (LEXAPRO) 10 MG tablet Take 1 tablet (10 mg total) by mouth daily.   . fexofenadine (ALLEGRA) 60 MG tablet Take 60 mg by mouth 2 (two) times daily.   . fluticasone (FLONASE) 50 MCG/ACT nasal spray Place 2 sprays into both nostrils daily.   . IODINE, KELP, PO Take 300 mcg by mouth daily. Reported on 10/16/2015   . lidocaine (XYLOCAINE) 2 % jelly Apply topically as needed. 01/30/2013: Received from: External Pharmacy Received Sig:   . LINZESS 290 MCG CAPS capsule TAKE 1 CAPSULE (290 MCG TOTAL) BY MOUTH DAILY.   [name] Nutritional Supplements (DHEA PO) Take 5 mg by mouth daily. Reported on 10/16/2015   . pantoprazole (PROTONIX) 40 MG tablet Take by mouth. 10/16/2015: Received from: [facility]  . ranitidine (ZANTAC) 150 MG tablet Take 150 mg by mouth 2 (two) times daily.   . traMADol (ULTRAM) 50  MG tablet TAKE 1 TABLET BY MOUTH EVERY 6 HOURS AS NEEDED   . XIIDRA 5 % SOLN INSTILL 1 DROP BY OPHTHALMIC ROUTE 2 TIMES EVERY DAY INTO BOTH EYES APPROXIMATELY 12 HOURS APART 12/09/2015: Received from: External Pharmacy  . azelastine (ASTELIN) 0.1 % nasal spray Place 2 sprays into both nostrils 2 (two) times daily. Use in each nostril as directed (Patient not taking: Reported on 12/08/2015)      Assessment: TMJ / inflammation Medication management - patient is on lots of alternative medications that I don't think are benefiting her.  Low Iodine Anxiety /  insomnia Cough   Plan: 1.  Orders Placed This Encounter  Procedures  . Iodine, Serum/Plasma  . Vitamin B12  . CBC with Differential/Platelet  . C-reactive protein    2.  Continue with dicofenac - may take a few days to see full results.  Keep appt with oral surgeon 3.  Discontinue Astelin, and all antihistamines.  Follow opthalmologist suggestions for dry eye treatment for the next 2 weeks 4.  Discontinue fish oil capsules (may worsen GERD).  Get 2 servings of fish weekly instead.  Restart pantoprzaole for GERD to see if cough improves.  If not better in 1-2 weeks or if cough worsens then call office for appt. With PCP 5.  Start Lexapro.  I think this could help a lot with her anxiety and will hopefully need less alprazolam.  Also recommmend she try melatonin 3-5mg  qhs for sleep.

## 2015-12-09 LAB — IODINE, SERUM/PLASMA: Iodine: 52.7 ug/L (ref 40.0–92.0)

## 2015-12-09 LAB — CBC WITH DIFFERENTIAL/PLATELET
BASOS: 1 %
Basophils Absolute: 0 10*3/uL (ref 0.0–0.2)
EOS (ABSOLUTE): 0.1 10*3/uL (ref 0.0–0.4)
EOS: 2 %
HEMATOCRIT: 31.6 % — AB (ref 34.0–46.6)
HEMOGLOBIN: 10.6 g/dL — AB (ref 11.1–15.9)
IMMATURE GRANS (ABS): 0 10*3/uL (ref 0.0–0.1)
IMMATURE GRANULOCYTES: 0 %
LYMPHS: 24 %
Lymphocytes Absolute: 0.9 10*3/uL (ref 0.7–3.1)
MCH: 30.2 pg (ref 26.6–33.0)
MCHC: 33.5 g/dL (ref 31.5–35.7)
MCV: 90 fL (ref 79–97)
MONOCYTES: 9 %
Monocytes Absolute: 0.4 10*3/uL (ref 0.1–0.9)
NEUTROS PCT: 64 %
Neutrophils Absolute: 2.4 10*3/uL (ref 1.4–7.0)
PLATELETS: 234 10*3/uL (ref 150–379)
RBC: 3.51 x10E6/uL — ABNORMAL LOW (ref 3.77–5.28)
RDW: 12.9 % (ref 12.3–15.4)
WBC: 3.7 10*3/uL (ref 3.4–10.8)

## 2015-12-09 LAB — VITAMIN B12: Vitamin B-12: 724 pg/mL (ref 211–946)

## 2015-12-09 LAB — C-REACTIVE PROTEIN: CRP: <0.3 mg/L (ref 0.0–4.9)

## 2015-12-14 ENCOUNTER — Ambulatory Visit (INDEPENDENT_AMBULATORY_CARE_PROVIDER_SITE_OTHER): Payer: Medicare Other | Admitting: Family Medicine

## 2015-12-14 ENCOUNTER — Encounter: Payer: Self-pay | Admitting: Family Medicine

## 2015-12-14 VITALS — BP 99/69 | HR 88 | Temp 98.3°F | Ht 69.0 in | Wt 102.4 lb

## 2015-12-14 DIAGNOSIS — M2669 Other specified disorders of temporomandibular joint: Secondary | ICD-10-CM | POA: Diagnosis not present

## 2015-12-14 DIAGNOSIS — F411 Generalized anxiety disorder: Secondary | ICD-10-CM

## 2015-12-14 DIAGNOSIS — M26649 Arthritis of unspecified temporomandibular joint: Secondary | ICD-10-CM

## 2015-12-14 MED ORDER — CELECOXIB 100 MG PO CAPS: 100.0000 mg | ORAL_CAPSULE | Freq: Two times a day (BID) | ORAL | 5 refills | Status: DC

## 2015-12-14 MED ORDER — LIDOCAINE HCL 2 % EX GEL: CUTANEOUS | 10 refills | Status: DC | PRN

## 2015-12-14 NOTE — Progress Notes (Signed)
BP 99/69   Pulse 88   Temp 98.3 F (36.8 C) (Oral)   Ht 5\' 9"  (1.753 m)   Wt 102 lb 6 oz (46.4 kg)   BMI 15.12 kg/m    Subjective:    Patient ID: [name], female    DOB: [birth year], 65 y.o.   MRN: [id number]  HPI: Kristy Garza is a 65 y.o. female presenting on 12/14/2015 for Discuss Diclofenac (patient is concerned of long term effects) and Anxiety (patient has not been taking Lexapro, would like to discuss referral to a counselor)   HPI Anxiety and depression Patient is coming in today for anxiety and depression. She was given Lexapro but never started it and has been using the occasional Xanax but she is fearful of most medications. She says she gets very anxious point where she can't sleep at night often and she would like to go see a psychiatrist for further evaluation. She denies any suicidal ideations or thoughts of hurting herself. She sees in integrative medicine doctor and he often tells her medications that she is taking about for her.  TMJ Patient has been taking diclofenac for her TMJ which does help but she is afraid of the side effects after talking to her integrated medicine doctor. She would like to try switching the Celebrex and see if that'll be better for her. She gets her TMJ almost all the time and it's bilaterally.  Relevant past medical, surgical, family and social history reviewed and updated as indicated. Interim medical history since our last visit reviewed. Allergies and medications reviewed and updated.  Review of Systems  Constitutional: Negative for chills and fever.  HENT: Negative for congestion, ear discharge and ear pain.   Eyes: Negative for redness and visual disturbance.  Respiratory: Negative for chest tightness and shortness of breath.   Cardiovascular: Negative for chest pain and leg swelling.  Genitourinary: Negative for difficulty urinating and dysuria.  Musculoskeletal: Positive for arthralgias (In jaw). Negative for back pain  and gait problem.  Skin: Negative for rash.  Neurological: Negative for light-headedness and headaches.  Psychiatric/Behavioral: Positive for decreased concentration, dysphoric mood and sleep disturbance. Negative for agitation, behavioral problems, confusion, self-injury and suicidal ideas. The patient is nervous/anxious.   All other systems reviewed and are negative.   Per HPI unless specifically indicated above     Medication List       Accurate as of 12/14/15 11:32 AM. Always use your most recent med list.          albuterol (2.5 MG/3ML) 0.083% nebulizer solution Commonly known as:  PROVENTIL Take 3 mLs (2.5 mg total) by nebulization every 6 (six) hours as needed for wheezing or shortness of breath.   albuterol 108 (90 Base) MCG/ACT inhaler Commonly known as:  PROAIR HFA Inhale 2 puffs into the lungs every 4 (four) hours as needed for wheezing or shortness of breath.   ALPRAZolam 0.5 MG tablet Commonly known as:  XANAX Take 1 tablet (0.5 mg total) by mouth 2 (two) times daily.   b complex vitamins capsule Take 1 capsule by mouth daily.   calcium citrate-vitamin D 315-200 MG-UNIT tablet Commonly known as:  CITRACAL+D Take 1 tablet by mouth. Reported on 07/27/2015   celecoxib 100 MG capsule Commonly known as:  CELEBREX Take 1 capsule (100 mg total) by mouth 2 (two) times daily.   DHEA PO Take 5 mg by mouth daily. Reported on 10/16/2015   escitalopram 10 MG tablet Commonly known as:  LEXAPRO Take 1 tablet (10 mg total) by mouth daily.   lidocaine 2 % jelly Commonly known as:  XYLOCAINE Apply topically as needed.   LINZESS 290 MCG Caps capsule Generic drug:  linaclotide TAKE 1 CAPSULE (290 MCG TOTAL) BY MOUTH DAILY.   pantoprazole 40 MG tablet Commonly known as:  PROTONIX Take by mouth.   traMADol 50 MG tablet Commonly known as:  ULTRAM TAKE 1 TABLET BY MOUTH EVERY 6 HOURS AS NEEDED   VITAMIN D PO Take 4,000 Units by mouth daily.   XIIDRA 5 %  Soln Generic drug:  [employer] INSTILL 1 DROP BY OPHTHALMIC ROUTE 2 TIMES EVERY DAY INTO BOTH EYES APPROXIMATELY 12 HOURS APART          Objective:    BP 99/69   Pulse 88   Temp 98.3 F (36.8 C) (Oral)   Ht 5\' 9"  (1.753 m)   Wt 102 lb 6 oz (46.4 kg)   BMI 15.12 kg/m   Wt Readings from Last 3 Encounters:  12/14/15 102 lb 6 oz (46.4 kg)  11/06/15 101 lb (45.8 kg)  10/16/15 104 lb 12.8 oz (47.5 kg)    Physical Exam  Constitutional: She is oriented to person, place, and time. She appears cachectic. No distress.  HENT:  Head:    Eyes: Conjunctivae are normal.  Neck: Neck supple. No thyromegaly present.  Cardiovascular: Normal rate, regular rhythm, normal heart sounds and intact distal pulses.   No murmur heard. Pulmonary/Chest: Effort normal and breath sounds normal. No respiratory distress. She has no wheezes.  Musculoskeletal: Normal range of motion. She exhibits no edema or tenderness.  Lymphadenopathy:    She has no cervical adenopathy.  Neurological: She is alert and oriented to person, place, and time. Coordination normal.  Skin: Skin is warm and dry. No rash noted. She is not diaphoretic.  Psychiatric: Her behavior is normal. Judgment normal. Her mood appears anxious. She exhibits a depressed mood. She expresses no suicidal ideation. She expresses no suicidal plans.  Nursing note and vitals reviewed.     Assessment & Plan:   Problem List Items Addressed This Visit      Other   Generalized anxiety disorder - Primary   Relevant Orders   Ambulatory referral to Psychiatry    Other Visit Diagnoses    TMJ arthritis       Relevant Medications   celecoxib (CELEBREX) 100 MG capsule       Follow up plan: Return in about 6 months (around 06/12/2016), or if symptoms worsen or fail to improve, for Recheck arthritis.  Counseling provided for all of the vaccine components Orders Placed This Encounter  Procedures  . Ambulatory referral to Psychiatry    Caryl Pina, MD [facility] 12/14/2015, 11:32 AM

## 2015-12-16 ENCOUNTER — Telehealth (HOSPITAL_COMMUNITY): Payer: Self-pay | Admitting: *Deleted

## 2015-12-16 ENCOUNTER — Telehealth: Payer: Self-pay | Admitting: Family Medicine

## 2015-12-16 DIAGNOSIS — M26649 Arthritis of unspecified temporomandibular joint: Secondary | ICD-10-CM

## 2015-12-16 DIAGNOSIS — M2669 Other specified disorders of temporomandibular joint: Principal | ICD-10-CM

## 2015-12-16 MED ORDER — CELECOXIB 100 MG PO CAPS: 100.0000 mg | ORAL_CAPSULE | Freq: Every day | ORAL | 5 refills | Status: DC

## 2015-12-16 NOTE — Telephone Encounter (Signed)
LEFT VOICE MESSAGE REGARDING AN APPOINTMENT. 

## 2015-12-16 NOTE — Telephone Encounter (Signed)
Patient aware.

## 2015-12-21 ENCOUNTER — Telehealth: Payer: Self-pay | Admitting: Family Medicine

## 2015-12-21 NOTE — Telephone Encounter (Signed)
Call from pt wanting to know if Celebrex could be taken with Tumeric No known interaction

## 2015-12-22 ENCOUNTER — Telehealth: Payer: Self-pay | Admitting: Family Medicine

## 2015-12-22 NOTE — Telephone Encounter (Signed)
Yes she can go back on her previous medication.

## 2015-12-28 ENCOUNTER — Telehealth: Payer: Self-pay | Admitting: Internal Medicine

## 2015-12-28 NOTE — Telephone Encounter (Signed)
Attempted to call pt. Line was busy x2. Will try back. 

## 2015-12-29 ENCOUNTER — Telehealth: Payer: Self-pay

## 2015-12-29 DIAGNOSIS — M26623 Arthralgia of bilateral temporomandibular joint: Secondary | ICD-10-CM

## 2015-12-29 NOTE — Telephone Encounter (Signed)
Left detailed message per dpr  

## 2015-12-29 NOTE — Telephone Encounter (Signed)
Attempted to call pt. Line was still busy. Will try back. 

## 2015-12-29 NOTE — Telephone Encounter (Signed)
Referral sent.   Laroy Apple, MD [facility] 12/29/2015, 9:42 AM

## 2015-12-30 NOTE — Telephone Encounter (Signed)
Attempted to call pt. Line is still busy. Per triage protocol, message will be closed.

## 2015-12-31 ENCOUNTER — Other Ambulatory Visit: Payer: Self-pay | Admitting: Pharmacist

## 2015-12-31 ENCOUNTER — Telehealth: Payer: Self-pay | Admitting: Family Medicine

## 2015-12-31 DIAGNOSIS — D649 Anemia, unspecified: Secondary | ICD-10-CM

## 2015-12-31 NOTE — Telephone Encounter (Signed)
Patient aware.

## 2015-12-31 NOTE — Telephone Encounter (Signed)
Patient wanted to know if it was ok to take tramadol and NSAIDs and how long can she take NSAID.  She can take tramadol and NSAID but make sure to take with food and try for 1 month.

## 2016-01-06 ENCOUNTER — Telehealth: Payer: Self-pay | Admitting: Allergy and Immunology

## 2016-01-06 NOTE — Telephone Encounter (Signed)
From reviewing the Epic record there does not appear to be a visit in the past year. Please make an appointment for her to return to this clinic for a OV. For her most recent problems she can see someone in the clinic as an acute visit as soon as possible.

## 2016-01-06 NOTE — Telephone Encounter (Signed)
Pt is concerned about the Qvar bc she states that if you have osteoporosis you should not be using it..  Pt also states she does have some post nasal drip, throat is red, coughing, headache,  No fever.All this started a week ago.   Please advise

## 2016-01-06 NOTE — Telephone Encounter (Signed)
Spoke with pt and informed her of what dr Raliegh Ip stated and she is making an appointment to be seen asap

## 2016-01-06 NOTE — Telephone Encounter (Signed)
Pt called and needs to talk with a nurse about the qvar med and some other question. [phone].

## 2016-01-07 ENCOUNTER — Encounter: Payer: Self-pay | Admitting: Family Medicine

## 2016-01-07 ENCOUNTER — Ambulatory Visit (HOSPITAL_COMMUNITY): Payer: Self-pay | Admitting: Psychiatry

## 2016-01-07 ENCOUNTER — Ambulatory Visit (INDEPENDENT_AMBULATORY_CARE_PROVIDER_SITE_OTHER): Payer: Medicare Other | Admitting: Family Medicine

## 2016-01-07 VITALS — BP 108/65 | HR 74 | Temp 97.7°F | Ht 69.0 in | Wt 101.8 lb

## 2016-01-07 DIAGNOSIS — F411 Generalized anxiety disorder: Secondary | ICD-10-CM | POA: Diagnosis not present

## 2016-01-07 DIAGNOSIS — E785 Hyperlipidemia, unspecified: Secondary | ICD-10-CM | POA: Diagnosis not present

## 2016-01-07 DIAGNOSIS — K589 Irritable bowel syndrome without diarrhea: Secondary | ICD-10-CM

## 2016-01-07 DIAGNOSIS — R7989 Other specified abnormal findings of blood chemistry: Secondary | ICD-10-CM

## 2016-01-07 DIAGNOSIS — R5383 Other fatigue: Secondary | ICD-10-CM

## 2016-01-07 DIAGNOSIS — R05 Cough: Secondary | ICD-10-CM

## 2016-01-07 DIAGNOSIS — R946 Abnormal results of thyroid function studies: Secondary | ICD-10-CM | POA: Diagnosis not present

## 2016-01-07 DIAGNOSIS — M26623 Arthralgia of bilateral temporomandibular joint: Secondary | ICD-10-CM | POA: Diagnosis not present

## 2016-01-07 DIAGNOSIS — R059 Cough, unspecified: Secondary | ICD-10-CM

## 2016-01-07 MED ORDER — ESCITALOPRAM OXALATE 10 MG PO TABS
10.0000 mg | ORAL_TABLET | Freq: Every day | ORAL | 5 refills | Status: DC
Start: 2016-01-07 — End: 2016-05-14

## 2016-01-07 NOTE — Progress Notes (Signed)
   HPI  Patient presents today follow-up of chronic medical conditions.  Patient states that she had bloating one day ago associated with IBS which has resolved with Gas-X.  She continues to have bilateral temporomandibular joint pain. This is helped by intermittent Voltaren. She's taken 4 doses in the last 1 week. He is also seeing a TMJ specialist who is doing injections, she cannot tell if this is helping.  She has persistent c that it feels like her nose is draining down the back of her throat.  She would like her thyroid checked today She does have chronic fatigue, and easily breaking nails.  PMH: Smoking status noted ROS: Per HPI  Objective: BP 108/65   Pulse 74   Temp 97.7 F (36.5 C) (Oral)   Ht '5\' 9"'$  (1.753 m)   Wt 101 lb 12.8 oz (46.2 kg)   BMI 15.03 kg/m  Gen: NAD, alert, cooperative with exam HEENT: NCAT CV: RRR, good S1/S2, no murmur Resp: CTABL, no wheezes, non-labored Abd: soft, positive bowel sounds, mild tenderness to palpation throughout without guarding Ext: No edema, warm Neuro: Alert and oriented, No gross deficits  Assessment and plan:  # irritable bowel syndrome Symptoms are stable  Bloating dealt with with Gas-X Continue Linzess  # temporomandibular joint pain Stable, helped by intermittent pain medication  # cough Recommended children's dose Claritin as an option, she is concerned that  Full dose Allegra may cause worsening dry eyes Likely postnasal drip related Has been on Protonix for years so unlikely GERD related Also with Hx of COPD, did not tolerate singulair Started Qvar  # fatigue She does have normocytic anemia which has been stable on recent checks, she's taking iron over-the-counter Checking thyroid, however previous testing showed low thyroid consistent with mild hyperthyroidism,  Hyperlipidemia Previous LDL was 1:15, recommended diet and exercise changes at that time. Rechecking  GAD Improving some on lexapro, only  taking for about 1 month Refill 10 mg, she is hesitant to go up to 20 mg  Orders Placed This Encounter  Procedures  . TSH  . T4, Free  . T3, Free  . Lipid panel  . BMP8+EGFR  . CBC with Differential/Platelet    Meds ordered this encounter  Medications  . beclomethasone (QVAR) 40 MCG/ACT inhaler    Sig: Inhale into the lungs.  [name] acetaminophen (TYLENOL) 500 MG tablet    Sig: Take by mouth.    Laroy Apple, MD [facility] 01/07/2016, 11:29 AM

## 2016-01-07 NOTE — Patient Instructions (Addendum)
Great to see you!  Come back in 2 months to follow up for anxiety, continue lexapro as prescribed.

## 2016-01-08 ENCOUNTER — Other Ambulatory Visit (INDEPENDENT_AMBULATORY_CARE_PROVIDER_SITE_OTHER): Payer: Medicare Other

## 2016-01-08 DIAGNOSIS — R946 Abnormal results of thyroid function studies: Secondary | ICD-10-CM | POA: Diagnosis not present

## 2016-01-08 DIAGNOSIS — E785 Hyperlipidemia, unspecified: Secondary | ICD-10-CM | POA: Diagnosis not present

## 2016-01-08 DIAGNOSIS — R5383 Other fatigue: Secondary | ICD-10-CM | POA: Diagnosis not present

## 2016-01-09 LAB — CBC WITH DIFFERENTIAL/PLATELET
BASOS ABS: 0 10*3/uL (ref 0.0–0.2)
Basos: 1 %
EOS (ABSOLUTE): 0.1 10*3/uL (ref 0.0–0.4)
Eos: 2 %
Hematocrit: 36 % (ref 34.0–46.6)
Hemoglobin: 12.1 g/dL (ref 11.1–15.9)
IMMATURE GRANULOCYTES: 0 %
Immature Grans (Abs): 0 10*3/uL (ref 0.0–0.1)
LYMPHS ABS: 0.6 10*3/uL — AB (ref 0.7–3.1)
Lymphs: 21 %
MCH: 31.4 pg (ref 26.6–33.0)
MCHC: 33.6 g/dL (ref 31.5–35.7)
MCV: 94 fL (ref 79–97)
MONOS ABS: 0.3 10*3/uL (ref 0.1–0.9)
Monocytes: 11 %
NEUTROS PCT: 65 %
Neutrophils Absolute: 2 10*3/uL (ref 1.4–7.0)
PLATELETS: 231 10*3/uL (ref 150–379)
RBC: 3.85 x10E6/uL (ref 3.77–5.28)
RDW: 13.1 % (ref 12.3–15.4)
WBC: 3 10*3/uL — AB (ref 3.4–10.8)

## 2016-01-09 LAB — LIPID PANEL
CHOL/HDL RATIO: 2.7 ratio (ref 0.0–4.4)
Cholesterol, Total: 199 mg/dL (ref 100–199)
HDL: 75 mg/dL (ref >39)
LDL Calculated: 113 mg/dL — ABNORMAL HIGH (ref 0–99)
Triglycerides: 54 mg/dL (ref 0–149)
VLDL CHOLESTEROL CAL: 11 mg/dL (ref 5–40)

## 2016-01-09 LAB — BMP8+EGFR
BUN / CREAT RATIO: 8 — AB (ref 12–28)
BUN: 7 mg/dL — ABNORMAL LOW (ref 8–27)
CHLORIDE: 100 mmol/L (ref 96–106)
CO2: 25 mmol/L (ref 18–29)
Calcium: 10 mg/dL (ref 8.7–10.3)
Creatinine, Ser: 0.87 mg/dL (ref 0.57–1.00)
GFR calc Af Amer: 81 mL/min/{1.73_m2} (ref >59)
GFR calc non Af Amer: 70 mL/min/{1.73_m2} (ref >59)
GLUCOSE: 89 mg/dL (ref 65–99)
Potassium: 4.7 mmol/L (ref 3.5–5.2)
SODIUM: 139 mmol/L (ref 134–144)

## 2016-01-09 LAB — T4, FREE: FREE T4: 1.42 ng/dL (ref 0.82–1.77)

## 2016-01-09 LAB — T3, FREE: T3, Free: 2.8 pg/mL (ref 2.0–4.4)

## 2016-01-09 LAB — TSH: TSH: 0.497 u[IU]/mL (ref 0.450–4.500)

## 2016-01-12 ENCOUNTER — Ambulatory Visit: Payer: Medicare Other | Admitting: Allergy and Immunology

## 2016-01-18 NOTE — Progress Notes (Signed)
Psychiatric Initial Adult Assessment   Patient Identification: Kristy Garza MRN:  [id number] Date of Evaluation:  01/19/2016 Referral Source: Dr. Kenn File Chief Complaint:   Chief Complaint    Anxiety; New Evaluation    "I have a stress" Visit Diagnosis:    ICD-9-CM ICD-10-CM   1. Adjustment disorder with anxious mood 309.24 F43.22     History of Present Illness:   Kristy Garza is a 65 year old female with anxiety, irritable bowel syndrome, arthritis, who presented for anxiety.   Patient reports she is having stress due to pain. She states she had sinus surgery in 2012, followed by polyp removal in 2004. She has tooth pain, neck pain with limited benefit from acupuncture. She has lost some weight, feels tired and exhausted since that time. She denies feeling depressed and reports she enjoys doing house chores which including cooking and feeding chickens. She reports occasional insomnia but sleeps good overall. She feels anxious at times, but denies panic attack. She feels Lexapro has been helping her (she takes it for 17 days). She used to go to yoga.   She denies decreased need for sleep. She denies SI, HI. She denies AH, VH. She denies trauma history. She denies alcohol use or drug use.  Associated Signs/Symptoms: Depression Symptoms:  fatigue, anxiety, (Hypo) Manic Symptoms:  denies Anxiety Symptoms:  mild anxiety Psychotic Symptoms:  denies PTSD Symptoms: Negative  Past Psychiatric History:  Outpatient: denies Psychiatry admission: denies  Previous suicide attempt:  denies Past trials of medication: gabapentin (tachycardia)  Previous Psychotropic Medications: Yes   Substance Abuse History in the last 12 months:  No.  Consequences of Substance Abuse: Negative  Past Medical History:  Past Medical History:  Diagnosis Date  . Acid reflux   . Allergy   . Arthritis    ARTHRITIS IN NECK BY DR. Alroy Dust [facility]  . Asthma   . Interstitial cystitis   .  Osteoporosis   . PONV (postoperative nausea and vomiting)   . Tinnitus   . Trigeminal neuralgia    Atypical trigeminal neuralgia    Past Surgical History:  Procedure Laterality Date  . ABDOMINAL HYSTERECTOMY  2000  . CYSTOSCOPY WITH HYDRODISTENSION AND BIOPSY N/A 06/11/2012   Procedure: CYSTOSCOPY/BIOPSY/HYDRODISTENSION with Instillation of Pyridium and Marcaine and Kenalog;  Surgeon: Ailene Rud, MD;  Location: [facility];  Service: Urology;  Laterality: N/A;  1 hour requested for this case  BLADDER BIOPSY  . ETHMOIDECTOMY  2012  . SEPTOPLASTY  1980's  . vocal cord surgery   1990's   polyp removal    Family Psychiatric History: denies  Family History:  Family History  Problem Relation Age of Onset  . Hyperlipidemia Mother   . Arthritis Mother   . Cancer Mother   . Lymphoma Mother   . Cancer Father   . Allergies Father   . Allergies Sister   . Allergies Sister     Social History:   Social History   Social History  . Marital status: Married    Spouse name: N/A  . Number of children: N/A  . Years of education: N/A   Social History Main Topics  . Smoking status: Never Smoker  . Smokeless tobacco: Never Used  . Alcohol use No     Comment: 01-19-2016 per pt no  . Drug use: No     Comment: 01-19-2016 per pt no   . Sexual activity: No   Other Topics Concern  . None  Social History Narrative   Lives with husband.     Additional Social History:  Education: 12th grade Work: unemployed, used to work as Quarry manager until 2012 Lives with her husband, she has 77, twins age 49,   Allergies:   Allergies  Allergen Reactions  . Augmentin [Amoxicillin-Pot Clavulanate] Diarrhea  . Avelox [Moxifloxacin Hcl In Nacl] Other (See Comments)    Tremors   . Biaxin [Clarithromycin]     Unsure of reaction  . Cedax [Ceftibuten] Other (See Comments)    tremors  . Ciprofloxacin Other (See Comments)    Blurred vision  . Clindamycin/Lincomycin      tachycardia  . Gabapentin     Constipation  . Levofloxacin Other (See Comments)    Feels dehydrated  . Lyrica [Pregabalin]     Drowsiness, dry mouth  . Singulair [Montelukast Sodium] Other (See Comments)    Numbness and tingling in hand.  . Sulfonamide Derivatives Other (See Comments)    Gi upset    Metabolic Disorder Labs: No results found for: HGBA1C, MPG No results found for: PROLACTIN Lab Results  Component Value Date   CHOL 199 01/08/2016   TRIG 54 01/08/2016   HDL 75 01/08/2016   CHOLHDL 2.7 01/08/2016   LDLCALC 113 (H) 01/08/2016   LDLCALC 115 (H) 06/24/2015     Current Medications: Current Outpatient Prescriptions  Medication Sig Dispense Refill  . acetaminophen (TYLENOL) 500 MG tablet Take by mouth.    [name] albuterol (PROAIR HFA) 108 (90 Base) MCG/ACT inhaler Inhale 2 puffs into the lungs every 4 (four) hours as needed for wheezing or shortness of breath. 1 Inhaler 1  . b complex vitamins capsule Take 1 capsule by mouth daily.    . beclomethasone (QVAR) 80 MCG/ACT inhaler Inhale 1 puff into the lungs 2 (two) times daily as needed.    . Budesonide (RHINOCORT ALLERGY NA) Place into the nose daily.    . calcium citrate-vitamin D (CITRACAL+D) 315-200 MG-UNIT per tablet Take 1 tablet by mouth. Reported on 07/27/2015    . Cholecalciferol (VITAMIN D PO) Take 4,000 Units by mouth daily.     [name] Dextromethorphan Polistirex (DELSYM PO) Take by mouth.    . escitalopram (LEXAPRO) 10 MG tablet Take 1 tablet (10 mg total) by mouth daily. 30 tablet 5  . Ferrous Sulfate Dried 200 (65 Fe) MG TABS Take 65 mg by mouth daily.    [name] guaiFENesin (MUCINEX) 600 MG 12 hr tablet Take 600 mg by mouth 2 (two) times daily as needed.    . lidocaine (XYLOCAINE) 2 % jelly Apply topically as needed. 30 mL 10  . LINZESS 290 MCG CAPS capsule TAKE 1 CAPSULE (290 MCG TOTAL) BY MOUTH DAILY. 30 capsule 4  . Multiple Vitamins-Minerals (PHYTOMULTI) TABS Take by mouth daily.    . pantoprazole (PROTONIX) 40 MG  tablet Take by mouth.    . phenylephrine (SUDAFED PE) 10 MG TABS tablet Take 10 mg by mouth every 4 (four) hours as needed.    . Probiotic Product (PROBIOTIC PO) Take by mouth daily.    . Simethicone (GAS-X PO) Take by mouth.    . traMADol (ULTRAM) 50 MG tablet TAKE 1 TABLET BY MOUTH EVERY 6 HOURS AS NEEDED (Patient taking differently: TAKE 0.5 TABLET BY MOUTH EVERY 6 HOURS AS NEEDED) 30 tablet 0  . XIIDRA 5 % SOLN INSTILL 1 DROP BY OPHTHALMIC ROUTE 2 TIMES EVERY DAY INTO BOTH EYES APPROXIMATELY 12 HOURS APART  11   Current Facility-Administered Medications  Medication  Dose Route Frequency Provider Last Rate Last Dose  . cyanocobalamin ((VITAMIN B-12)) injection 1,000 mcg  1,000 mcg Intramuscular Q30 days [name], MD   1,000 mcg at 06/16/14 1324    Neurologic: Headache: Yes Seizure: No Paresthesias:No  Musculoskeletal: Strength & Muscle Tone: within normal limits Gait & Station: normal Patient leans: N/A  Psychiatric Specialty Exam: Review of Systems  Constitutional: Positive for malaise/fatigue.  HENT:       Tooth pain  Musculoskeletal: Positive for back pain and neck pain.  Psychiatric/Behavioral: Positive for depression. Negative for hallucinations, substance abuse and suicidal ideas. The patient is nervous/anxious. The patient does not have insomnia.   All other systems reviewed and are negative.   Blood pressure 108/79, pulse 73, height 5\' 9"  (1.753 m), weight 102 lb 9.6 oz (46.5 kg).Body mass index is 15.15 kg/m.  General Appearance: Well Groomed  Eye Contact:  Good looking down her notes at times  Speech:  Clear and Coherent  Volume:  Normal  Mood:  "good"  Affect:  euthymic, smile at times  Thought Process:  Coherent and Goal Directed  Orientation:  Full (Time, Place, and Person)  Thought Content:  Logical  Suicidal Thoughts:  No  Homicidal Thoughts:  No  Memory:  Immediate;   Good Recent;   Good Remote;   Good  Judgement:  Good  Insight:  Good   Psychomotor Activity:  Normal  Concentration:  Concentration: Good and Attention Span: Good  Recall:  Good  Fund of Knowledge:Good  Language: Good  Akathisia:  NA  Handed:  Right  AIMS (if indicated):  N/A  Assets:  Communication Skills Desire for Improvement  ADL's:  Intact  Cognition: WNL  Sleep:  good   Assessment Kristy Garza is a 65 year old female with anxiety, irritable bowel syndrome, bilateral temporomandibular joint pain, arthritis, who presented for anxiety.   # Adjustment disorder with anxiety  # R/o GAD Patient reports improvement in her anxiety since she was started on Lexapro. Given she denies any functional issues due to anxiety, will continue current dose. Discussed behavioral activation and mindfulness, which includes breathing exercise. Discussed that Lexapro can be increased further if she has worsening anxiety; also discussed the option of duloxetine, nortriptyline in the future to target both anxiety and pain. Patient will be returned to clinic as needed.   Plan - Continue Lexapro 10 mg daily - RTC as needed  The patient demonstrates the following risk factors for suicide: Chronic risk factors for suicide include: psychiatric disorder of anxiety and chronic pain. Acute risk factors for suicide include: unemployment. Protective factors for this patient include: coping skills and hope for the future. Considering these factors, the overall suicide risk at this point appears to be low. Patient is appropriate for outpatient follow up.  Treatment Plan Summary: Medication management   Norman Clay, MD 10/24/20173:12 PM

## 2016-01-19 ENCOUNTER — Encounter (HOSPITAL_COMMUNITY): Payer: Self-pay | Admitting: Psychiatry

## 2016-01-19 ENCOUNTER — Ambulatory Visit (INDEPENDENT_AMBULATORY_CARE_PROVIDER_SITE_OTHER): Payer: Medicare Other | Admitting: Psychiatry

## 2016-01-19 ENCOUNTER — Other Ambulatory Visit: Payer: Self-pay | Admitting: Family Medicine

## 2016-01-19 VITALS — BP 108/79 | HR 73 | Ht 69.0 in | Wt 102.6 lb

## 2016-01-19 DIAGNOSIS — F4322 Adjustment disorder with anxiety: Secondary | ICD-10-CM

## 2016-01-19 DIAGNOSIS — Z8489 Family history of other specified conditions: Secondary | ICD-10-CM | POA: Diagnosis not present

## 2016-01-19 DIAGNOSIS — Z8261 Family history of arthritis: Secondary | ICD-10-CM | POA: Diagnosis not present

## 2016-01-19 DIAGNOSIS — Z888 Allergy status to other drugs, medicaments and biological substances status: Secondary | ICD-10-CM | POA: Diagnosis not present

## 2016-01-19 DIAGNOSIS — F411 Generalized anxiety disorder: Secondary | ICD-10-CM | POA: Insufficient documentation

## 2016-01-19 DIAGNOSIS — Z79899 Other long term (current) drug therapy: Secondary | ICD-10-CM

## 2016-01-19 NOTE — Patient Instructions (Signed)
1. Continue current medication 2. Return to clinic as needed

## 2016-01-21 ENCOUNTER — Other Ambulatory Visit: Payer: Self-pay | Admitting: *Deleted

## 2016-01-21 MED ORDER — FLUTICASONE PROPIONATE 50 MCG/ACT NA SUSP: 2.0000 | Freq: Every day | NASAL | 6 refills | Status: DC

## 2016-01-26 ENCOUNTER — Ambulatory Visit (INDEPENDENT_AMBULATORY_CARE_PROVIDER_SITE_OTHER): Payer: Medicare Other | Admitting: Allergy and Immunology

## 2016-01-26 ENCOUNTER — Encounter: Payer: Self-pay | Admitting: Allergy and Immunology

## 2016-01-26 VITALS — BP 88/60 | HR 68 | Resp 18

## 2016-01-26 DIAGNOSIS — G500 Trigeminal neuralgia: Secondary | ICD-10-CM

## 2016-01-26 DIAGNOSIS — M26623 Arthralgia of bilateral temporomandibular joint: Secondary | ICD-10-CM | POA: Diagnosis not present

## 2016-01-26 DIAGNOSIS — J3089 Other allergic rhinitis: Secondary | ICD-10-CM

## 2016-01-26 DIAGNOSIS — R51 Headache: Secondary | ICD-10-CM | POA: Diagnosis not present

## 2016-01-26 DIAGNOSIS — J4531 Mild persistent asthma with (acute) exacerbation: Secondary | ICD-10-CM

## 2016-01-26 DIAGNOSIS — R519 Headache, unspecified: Secondary | ICD-10-CM

## 2016-01-26 DIAGNOSIS — K219 Gastro-esophageal reflux disease without esophagitis: Secondary | ICD-10-CM | POA: Diagnosis not present

## 2016-01-26 MED ORDER — RANITIDINE HCL 300 MG PO TABS: 300.0000 mg | ORAL_TABLET | Freq: Every day | ORAL | 5 refills | Status: DC

## 2016-01-26 MED ORDER — FLUTICASONE FUROATE 200 MCG/ACT IN AEPB: 1.0000 | INHALATION_SPRAY | Freq: Every day | RESPIRATORY_TRACT | 5 refills | Status: DC

## 2016-01-26 NOTE — Progress Notes (Signed)
Follow-up Note  Referring Provider: Timmothy Euler, MD Primary Provider: Kenn File, MD Date of Office Visit: 01/26/2016  Subjective:   Kristy Garza (DOB: September 26, 1950) is a 65 y.o. female who returns to the [facility] on 01/26/2016 in re-evaluation of the following:  HPI: [name] returns to this clinic in reevaluation of her respiratory tract problems. I've not seen her in this clinic in years.  She complains of cough. She complains of cough associated with postnasal drip and throat clearing for which she must use a cough drop almost all day. In fact, she goes to sleep with a cough drop in her mouth. She does have some stuffy nose as well. She does not have any associated chest tightness or shortness of breath or anosmia or ugly nasal discharge. This is a prolonged issue that is been going on for many many years and waxes and wanes throughout the year.  Miriana has seen multiple physicians for this issue in the past and has had multiple diagnosis assigned to her regarding this issue and has been treated with multiple medications in the past. She is not sure that anything really helps her to any large degree.  She has seen an ear nose throat physician in [place] and an ear nose and throat physician in [place] and has seen Dr. Annamaria Boots in pulmonary and has seen integrative medicine physicians and has been told that she has food allergies giving rise to her stomach problems and her cough. She shows me a list of 27 food items that she is attempting to avoid based upon this blood test result. She recently had an upper endoscopy performed in [place] and apparently this turned out okay. She uses Protonix on a regular basis and sometimes will addend Zantac at nighttime. She is intolerant of using Protonix twice a day secondary to GI upset. She does not consume any liquid caffeine or eat any chocolate but she does take Excedrin with caffeine twice a day for  headaches. She's been given Qvar and pro-air in the past but she is scared to use Qvar because of her osteoporosis.  She has problems with trigeminal neuralgia, TMJ,  and chronic daily headaches that have been addressed by multiple physicians and treated with multiple forms of medical therapy in the past including Lyrica, bite guard, acupuncture, and most recently receiving "glucose shots in her face" from integrative medicine practice.    Medication List      acetaminophen 500 MG tablet Commonly known as:  TYLENOL Take by mouth.   albuterol 108 (90 Base) MCG/ACT inhaler Commonly known as:  PROAIR HFA Inhale 2 puffs into the lungs every 4 (four) hours as needed for wheezing or shortness of breath.   b complex vitamins capsule Take 1 capsule by mouth daily.   beclomethasone 80 MCG/ACT inhaler Commonly known as:  QVAR Inhale 1 puff into the lungs 2 (two) times daily as needed.   calcium citrate-vitamin D 315-200 MG-UNIT tablet Commonly known as:  CITRACAL+D Take 1 tablet by mouth. Reported on 07/27/2015   DELSYM PO Take by mouth.   escitalopram 10 MG tablet Commonly known as:  LEXAPRO Take 1 tablet (10 mg total) by mouth daily.   Ferrous Sulfate Dried 200 (65 Fe) MG Tabs Take 65 mg by mouth daily.   fluticasone 50 MCG/ACT nasal spray Commonly known as:  FLONASE Place 2 sprays into both nostrils daily.   GAS-X PO Take by mouth.   guaiFENesin 600 MG 12 hr  tablet Commonly known as:  MUCINEX Take 600 mg by mouth 2 (two) times daily as needed.   lidocaine 2 % jelly Commonly known as:  XYLOCAINE Apply topically as needed.   LINZESS 290 MCG Caps capsule Generic drug:  linaclotide TAKE 1 CAPSULE (290 MCG TOTAL) BY MOUTH DAILY.   pantoprazole 40 MG tablet Commonly known as:  PROTONIX Take by mouth.   phenylephrine 10 MG Tabs tablet Commonly known as:  SUDAFED PE Take 10 mg by mouth every 4 (four) hours as needed.   PHYTOMULTI Tabs Take by mouth daily.   PROBIOTIC  PO Take by mouth daily.   traMADol 50 MG tablet Commonly known as:  ULTRAM TAKE 1 TABLET BY MOUTH EVERY 6 HOURS AS NEEDED   VITAMIN D PO Take 4,000 Units by mouth daily.   XIIDRA 5 % Soln Generic drug:  Lifitegrast INSTILL 1 DROP BY OPHTHALMIC ROUTE 2 TIMES EVERY DAY INTO BOTH EYES APPROXIMATELY 12 HOURS APART       Past Medical History:  Diagnosis Date  . Acid reflux   . Allergy   . Arthritis    ARTHRITIS IN NECK BY DR. Alroy Dust [facility]  . Asthma   . Interstitial cystitis   . Osteoporosis   . PONV (postoperative nausea and vomiting)   . Tinnitus   . Trigeminal neuralgia    Atypical trigeminal neuralgia    Past Surgical History:  Procedure Laterality Date  . ABDOMINAL HYSTERECTOMY  2000  . CYSTOSCOPY WITH HYDRODISTENSION AND BIOPSY N/A 06/11/2012   Procedure: CYSTOSCOPY/BIOPSY/HYDRODISTENSION with Instillation of Pyridium and Marcaine and Kenalog;  Surgeon: Ailene Rud, MD;  Location: [facility];  Service: Urology;  Laterality: N/A;  1 hour requested for this case  BLADDER BIOPSY  . ETHMOIDECTOMY  2012  . SEPTOPLASTY  1980's  . vocal cord surgery   1990's   polyp removal    Allergies  Allergen Reactions  . Cefuroxime Axetil Other (See Comments)    Extreme gas  . Augmentin [Amoxicillin-Pot Clavulanate] Diarrhea  . Avelox [Moxifloxacin Hcl In Nacl] Other (See Comments)    Tremors   . Biaxin [Clarithromycin]     Unsure of reaction  . Cedax [Ceftibuten] Other (See Comments)    tremors  . Ciprofloxacin Other (See Comments)    Blurred vision  . Clindamycin/Lincomycin     tachycardia  . Gabapentin     Constipation  . Levofloxacin Other (See Comments)    Feels dehydrated  . Lyrica [Pregabalin]     Drowsiness, dry mouth  . Singulair [Montelukast Sodium] Other (See Comments)    Numbness and tingling in hand.  . Sulfonamide Derivatives Other (See Comments)    Gi upset    Review of systems negative except as noted in HPI / PMHx or  noted below:  Review of Systems  Constitutional: Negative.   HENT: Negative.   Eyes: Negative.   Respiratory: Negative.   Cardiovascular: Negative.   Gastrointestinal: Negative.   Genitourinary: Negative.   Musculoskeletal: Negative.   Skin: Negative.   Neurological: Negative.   Endo/Heme/Allergies: Negative.   Psychiatric/Behavioral: Negative.      Objective:   Vitals:   01/26/16 1142  BP: (!) 88/60  Pulse: 68  Resp: 18          Physical Exam  Constitutional: She is well-developed, well-nourished, and in no distress.  Raspy voice, throat clearing  HENT:  Head: Normocephalic.  Right Ear: Tympanic membrane, external ear and ear canal normal.  Left Ear: Tympanic membrane,  external ear and ear canal normal.  Nose: Nose normal. No mucosal edema or rhinorrhea.  Mouth/Throat: Uvula is midline, oropharynx is clear and moist and mucous membranes are normal. No oropharyngeal exudate.  Eyes: Conjunctivae are normal.  Neck: Trachea normal. No tracheal tenderness present. No tracheal deviation present. No thyromegaly present.  Cardiovascular: Normal rate, regular rhythm, S1 normal, S2 normal and normal heart sounds.   No murmur heard. Pulmonary/Chest: Breath sounds normal. No stridor. No respiratory distress. She has no wheezes. She has no rales.  Musculoskeletal: She exhibits no edema.  Lymphadenopathy:       Head (right side): No tonsillar adenopathy present.       Head (left side): No tonsillar adenopathy present.    She has no cervical adenopathy.  Neurological: She is alert. Gait normal.  Skin: No rash noted. She is not diaphoretic. No erythema. Nails show no clubbing.  Psychiatric: Mood and affect normal.    Diagnostics:    Spirometry was performed and demonstrated an FEV1 of 2.63 at 97 % of predicted.  Assessment and Plan:   1. Asthma, not well controlled, mild persistent, with acute exacerbation   2. Other allergic rhinitis   3. LPRD (laryngopharyngeal reflux  disease)   4. Chronic daily headache   5. Trigeminal neuralgia   6. Bilateral temporomandibular joint pain     1. Treat reflux:   A. Taper off all forms of caffeine including Excedrin  B. Protonix 40 mg once a day  C. consistently use ranitidine 300 mg in the evening  2. Treat inflammation:   A. continue Flonase one-2 sprays each nostril one time per day  B. start Arnuity 200 one inhalation 1 time per day  3. If needed:   A. nasal saline  B. ProAir HFA 2 puffs every 4-6 hours  C. OTC antihistamine, OTC decongestant tablet, OTC Mucinex  4. Return to clinic in 3 weeks or earlier if problem  I will have Lakeila utilize the therapy mentioned above which is a little bit more aggressive approach directed against her reflux and having her start anti-inflammatory medications for her respiratory tract with a combination of a nasal steroid and inhaled steroid. We'll see what type of response we get with this approach in approximately 3 weeks. She'll contact me during the interval should she develop a significant problem. I've encouraged her to go back to see her neurologist regarding further management of all of her head pain and nerve issues.  Allena Katz, MD [place]

## 2016-01-26 NOTE — Patient Instructions (Addendum)
  1. Treat reflux:   A. Taper off all forms of caffeine including Excedrin  B. Protonix 40 mg once a day  C. consistently use ranitidine 300 mg in the evening  2. Treat inflammation:   A. continue Flonase one-2 sprays each nostril one time per day  B. start Arnuity 200 one inhalation 1 time per day  3. If needed:   A. nasal saline  B. ProAir HFA 2 puffs every 4-6 hours  C. OTC antihistamine, OTC decongestant tablet, OTC Mucinex  4. Return to clinic in 3 weeks or earlier if problem

## 2016-01-28 ENCOUNTER — Telehealth: Payer: Self-pay | Admitting: Family Medicine

## 2016-01-28 NOTE — Telephone Encounter (Signed)
Patient has questions about new medication that her pulmonologist gave her Arnuity.  Discussed patient's concerns and reassured her that side effects are minimal and to continue with plan to start [place].

## 2016-02-01 ENCOUNTER — Telehealth: Payer: Self-pay | Admitting: Allergy and Immunology

## 2016-02-01 ENCOUNTER — Telehealth: Payer: Self-pay | Admitting: Family Medicine

## 2016-02-01 NOTE — Telephone Encounter (Signed)
Please advise 

## 2016-02-01 NOTE — Telephone Encounter (Signed)
Pt called and would like to have something called  in for her cough that she can not get rid of. [facility] [phone].

## 2016-02-02 ENCOUNTER — Telehealth: Payer: Self-pay | Admitting: Allergy and Immunology

## 2016-02-02 MED ORDER — PREDNISONE 10 MG PO TABS: ORAL_TABLET | ORAL | 0 refills | Status: DC

## 2016-02-02 NOTE — Telephone Encounter (Signed)
Patient called and wants to

## 2016-02-02 NOTE — Telephone Encounter (Signed)
Patient saw Dr. Neldon Mc on 01/26/16. She would like to know if he diagnosed her with bronchitis. Says she still has a cough.

## 2016-02-02 NOTE — Telephone Encounter (Signed)
Patient advised of instructions and rx sent 

## 2016-02-02 NOTE — Telephone Encounter (Signed)
I don't know if this was already routed to you or not.  Please advise

## 2016-02-02 NOTE — Telephone Encounter (Signed)
Please inform patient that she can use a low dose of prednisone at 10 mg once a day for the next 10 days. We need to make sure that she stops all forms of caffeine including her Excedrin to help with the reflux-induced cough that appears to be a component of her chronic cough.

## 2016-02-05 ENCOUNTER — Telehealth: Payer: Self-pay | Admitting: Family Medicine

## 2016-02-05 MED ORDER — BENZONATATE 200 MG PO CAPS: 200.0000 mg | ORAL_CAPSULE | Freq: Two times a day (BID) | ORAL | 1 refills | Status: DC | PRN

## 2016-02-05 NOTE — Telephone Encounter (Signed)
sent 

## 2016-02-05 NOTE — Telephone Encounter (Signed)
Please advise if refill will be given on tessalon pearls.  Last office visit 01-26-16. Patient has upcoming appointments with allergist and pulmonology.

## 2016-02-08 ENCOUNTER — Telehealth: Payer: Self-pay | Admitting: Family Medicine

## 2016-02-09 ENCOUNTER — Other Ambulatory Visit: Payer: Self-pay | Admitting: Family Medicine

## 2016-02-09 NOTE — Telephone Encounter (Signed)
Patient aware that anything for pain could possibly help with TMJ.  Informed patient to try taking ibuprofen, heat and ice to help.  Patient verbalized understanding.

## 2016-02-10 NOTE — Telephone Encounter (Signed)
Left message patient would need to be seen for refill request.

## 2016-02-10 NOTE — Telephone Encounter (Signed)
Will need to be seen for refill of tramadol.   Laroy Apple, MD [facility] 02/10/2016, 3:34 PM

## 2016-02-11 ENCOUNTER — Ambulatory Visit (INDEPENDENT_AMBULATORY_CARE_PROVIDER_SITE_OTHER): Payer: Medicare Other | Admitting: Family Medicine

## 2016-02-11 ENCOUNTER — Encounter: Payer: Self-pay | Admitting: Family Medicine

## 2016-02-11 VITALS — BP 92/62 | HR 78 | Temp 97.4°F | Ht 69.0 in | Wt 103.1 lb

## 2016-02-11 DIAGNOSIS — G500 Trigeminal neuralgia: Secondary | ICD-10-CM

## 2016-02-11 DIAGNOSIS — R300 Dysuria: Secondary | ICD-10-CM | POA: Diagnosis not present

## 2016-02-11 LAB — URINALYSIS, COMPLETE
BILIRUBIN UA: NEGATIVE
Glucose, UA: NEGATIVE
KETONES UA: NEGATIVE
Leukocytes, UA: NEGATIVE
NITRITE UA: NEGATIVE
Protein, UA: NEGATIVE
SPEC GRAV UA: 1.02 (ref 1.005–1.030)
UUROB: 0.2 mg/dL (ref 0.2–1.0)
pH, UA: 5 (ref 5.0–7.5)

## 2016-02-11 LAB — MICROSCOPIC EXAMINATION
Bacteria, UA: NONE SEEN
Renal Epithel, UA: NONE SEEN /hpf

## 2016-02-11 MED ORDER — TRAMADOL HCL 50 MG PO TABS: 50.0000 mg | ORAL_TABLET | Freq: Four times a day (QID) | ORAL | 1 refills | Status: DC | PRN

## 2016-02-11 NOTE — Telephone Encounter (Signed)
Pt has appt tonight

## 2016-02-16 ENCOUNTER — Ambulatory Visit: Payer: Medicare Other | Admitting: Allergy and Immunology

## 2016-02-17 NOTE — Progress Notes (Signed)
BP 92/62   Pulse 78   Temp 97.4 F (36.3 C) (Oral)   Ht 5\' 9"  (1.753 m)   Wt 103 lb 2 oz (46.8 kg)   BMI 15.23 kg/m    Subjective:    Patient ID: [name], female    DOB: 29-May-1950, 65 y.o.   MRN: [id number]  HPI: Kristy Garza is a 65 y.o. female presenting on 02/11/2016 for Medication Refill (Tramadol) and Urinary Tract Infection (burning with urination)   HPI Dysuria Patient is having dysuria and burning with urination and increased frequency but she has known interstitial cystitis and just wants to make sure it is not an infection and that it is just the interstitial cystitis. She denies any fevers or chills or flank tenderness. She denies any vaginal discharge or bleeding or issues.  Trigeminal neuralgia, refill on tramadol Patient uses the tramadol sparingly and just needs a refill on it for when her trigeminal neuralgia or facial pain flares up. She is not having any currently today but just needs it for when it does flare up.  Relevant past medical, surgical, family and social history reviewed and updated as indicated. Interim medical history since our last visit reviewed. Allergies and medications reviewed and updated.  Review of Systems  Constitutional: Negative for chills and fever.  HENT: Negative for congestion, ear discharge and ear pain.   Eyes: Negative for redness and visual disturbance.  Respiratory: Negative for chest tightness and shortness of breath.   Cardiovascular: Negative for chest pain and leg swelling.  Gastrointestinal: Negative for abdominal pain.  Genitourinary: Positive for dysuria and frequency. Negative for decreased urine volume, difficulty urinating, flank pain, hematuria, pelvic pain, urgency, vaginal bleeding, vaginal discharge and vaginal pain.  Musculoskeletal: Negative for back pain and gait problem.  Skin: Negative for rash.  Neurological: Negative for light-headedness and headaches.  Psychiatric/Behavioral: Negative for  agitation and behavioral problems.  All other systems reviewed and are negative.   Per HPI unless specifically indicated above      Objective:    BP 92/62   Pulse 78   Temp 97.4 F (36.3 C) (Oral)   Ht 5\' 9"  (1.753 m)   Wt 103 lb 2 oz (46.8 kg)   BMI 15.23 kg/m   Wt Readings from Last 3 Encounters:  02/11/16 103 lb 2 oz (46.8 kg)  01/07/16 101 lb 12.8 oz (46.2 kg)  12/14/15 102 lb 6 oz (46.4 kg)    Physical Exam  Constitutional: She is oriented to person, place, and time. She appears well-developed and well-nourished. No distress.  HENT:  Head: Normocephalic and atraumatic.  Eyes: Conjunctivae are normal.  Cardiovascular: Normal rate, regular rhythm, normal heart sounds and intact distal pulses.   No murmur heard. Pulmonary/Chest: Effort normal and breath sounds normal. No respiratory distress. She has no wheezes. She has no rales.  Abdominal: Soft. Bowel sounds are normal. She exhibits no distension. There is no tenderness. There is no rebound.  Musculoskeletal: Normal range of motion. She exhibits no edema or tenderness.  Neurological: She is alert and oriented to person, place, and time. Coordination normal.  Skin: Skin is warm and dry. No rash noted. She is not diaphoretic.  Psychiatric: She has a normal mood and affect. Her behavior is normal.  Nursing note and vitals reviewed.   U/a: minimal wbcs and Trace WBCs    Assessment & Plan:   Problem List Items Addressed This Visit      Nervous and Auditory  Trigeminal neuralgia    Other Visit Diagnoses    Dysuria    -  Primary   Relevant Orders   Urinalysis, Complete (Completed)       Follow up plan: Return if symptoms worsen or fail to improve.  Counseling provided for all of the vaccine components Orders Placed This Encounter  Procedures  . Microscopic Examination  . Urinalysis, Complete    Caryl Pina, MD [facility] 02/17/2016, 2:20 PM

## 2016-02-24 ENCOUNTER — Ambulatory Visit: Payer: Medicare Other | Admitting: Allergy and Immunology

## 2016-03-01 ENCOUNTER — Encounter: Payer: Self-pay | Admitting: Family Medicine

## 2016-03-01 ENCOUNTER — Ambulatory Visit (INDEPENDENT_AMBULATORY_CARE_PROVIDER_SITE_OTHER): Payer: Medicare Other | Admitting: Family Medicine

## 2016-03-01 VITALS — BP 103/70 | HR 71 | Temp 97.5°F | Resp 20 | Ht 69.0 in | Wt 109.0 lb

## 2016-03-01 DIAGNOSIS — J40 Bronchitis, not specified as acute or chronic: Secondary | ICD-10-CM | POA: Diagnosis not present

## 2016-03-01 DIAGNOSIS — J209 Acute bronchitis, unspecified: Secondary | ICD-10-CM | POA: Diagnosis not present

## 2016-03-01 DIAGNOSIS — J329 Chronic sinusitis, unspecified: Secondary | ICD-10-CM

## 2016-03-01 MED ORDER — AZITHROMYCIN 250 MG PO TABS: ORAL_TABLET | ORAL | 0 refills | Status: DC

## 2016-03-01 NOTE — Progress Notes (Signed)
Subjective:  Patient ID: Kristy Garza Kitchen, female    DOB: Nov 08, 1950  Age: 65 y.o. MRN: [id number]  CC: Nasal Congestion; Cough; and Sinus pressure   HPI Kristy Garza presents for Patient presents with upper respiratory congestion. Rhinorrhea that is frequently purulent. There is moderate sore throat. Patient reports coughing frequently as well.-colored/purulent sputum noted. There is no fever no chills no sweats. The patient denies being short of breath. Onset was 3-5 days ago. Gradually worsening in spite of home remedies.    History Kristy Garza has a past medical history of Acid reflux; Allergy; Arthritis; Asthma; Interstitial cystitis; Osteoporosis; PONV (postoperative nausea and vomiting); Tinnitus; and Trigeminal neuralgia.   She has a past surgical history that includes Abdominal hysterectomy (2000); vocal cord surgery  [id number]); Ethmoidectomy (2012); Septoplasty (1980's); and Cystoscopy with hydrodistension and biopsy (N/A, 06/11/2012).   Her family history includes Allergies in her father, sister, and sister; Arthritis in her mother; Cancer in her father and mother; Hyperlipidemia in her mother; Lymphoma in her mother.She reports that she has never smoked. She has never used smokeless tobacco. She reports that she does not drink alcohol or use drugs.    ROS Review of Systems  Constitutional: Negative for activity change, appetite change, chills and fever.  HENT: Positive for congestion, postnasal drip, rhinorrhea and sinus pressure. Negative for ear discharge, ear pain, hearing loss, nosebleeds, sneezing and trouble swallowing.   Respiratory: Negative for chest tightness and shortness of breath.   Cardiovascular: Negative for chest pain and palpitations.  Skin: Negative for rash.    Objective:  BP 103/70   Pulse 71   Temp 97.5 F (36.4 C) (Oral)   Resp 20   Ht 5\' 9"  (1.753 m)   Wt 109 lb (49.4 kg)   SpO2 100%   BMI 16.10 kg/m   BP Readings from Last 3 Encounters:    03/01/16 103/70  02/11/16 92/62  01/26/16 (!) 88/60    Wt Readings from Last 3 Encounters:  03/01/16 109 lb (49.4 kg)  02/11/16 103 lb 2 oz (46.8 kg)  01/07/16 101 lb 12.8 oz (46.2 kg)     Physical Exam  Constitutional: She appears well-developed and well-nourished.  HENT:  Head: Normocephalic and atraumatic.  Right Ear: Tympanic membrane and external ear normal. No decreased hearing is noted.  Left Ear: Tympanic membrane and external ear normal. No decreased hearing is noted.  Nose: Mucosal edema present. Right sinus exhibits no frontal sinus tenderness. Left sinus exhibits no frontal sinus tenderness.  Mouth/Throat: No oropharyngeal exudate or posterior oropharyngeal erythema.  Neck: No Brudzinski's sign noted.  Pulmonary/Chest: Breath sounds normal. No respiratory distress.  Lymphadenopathy:       Head (right side): No preauricular adenopathy present.       Head (left side): No preauricular adenopathy present.       Right cervical: No superficial cervical adenopathy present.      Left cervical: No superficial cervical adenopathy present.     Lab Results  Component Value Date   WBC 3.0 (L) 01/08/2016   HGB 11.6 (A) 06/16/2014   HCT 36.0 01/08/2016   PLT 231 01/08/2016   GLUCOSE 89 01/08/2016   CHOL 199 01/08/2016   TRIG 54 01/08/2016   HDL 75 01/08/2016   LDLDIRECT 99 02/14/2014   LDLCALC 113 (H) 01/08/2016   ALT 16 06/24/2015   AST 26 06/24/2015   NA 139 01/08/2016   K 4.7 01/08/2016   CL 100 01/08/2016   CREATININE 0.87 01/08/2016  BUN 7 (L) 01/08/2016   CO2 25 01/08/2016   TSH 0.497 01/08/2016    No results found.  Assessment & Plan:   Kristy Garza was seen today for nasal congestion, cough and sinus pressure.  Diagnoses and all orders for this visit:  Sinobronchitis  Acute bronchitis, unspecified organism  Other orders -     azithromycin (ZITHROMAX Z-PAK) 250 MG tablet; Take two right away Then one a day for the next 4 days.    Pt. Asked for a  referral for an unrelated problem. I requested she see her primary for this.  I am having Kristy Garza start on azithromycin. I am also having her maintain her b complex vitamins, pantoprazole, albuterol, XIIDRA, lidocaine, acetaminophen, escitalopram, LINZESS, beclomethasone, Ferrous Sulfate Dried, guaiFENesin, Dextromethorphan Polistirex (DELSYM PO), phenylephrine, Simethicone (GAS-X PO), PHYTOMULTI, Probiotic Product (PROBIOTIC PO), fluticasone, ranitidine, Fluticasone Furoate, predniSONE, benzonatate, Fluticasone Furoate, and traMADol. We will continue to administer cyanocobalamin.  Meds ordered this encounter  Medications  . azithromycin (ZITHROMAX Z-PAK) 250 MG tablet    Sig: Take two right away Then one a day for the next 4 days.    Dispense:  6 each    Refill:  0     Follow-up: No Follow-up on file.  [name], M.D.

## 2016-03-18 ENCOUNTER — Telehealth: Payer: Self-pay | Admitting: Allergy and Immunology

## 2016-03-18 MED ORDER — PREDNISONE 10 MG PO TABS: 10.0000 mg | ORAL_TABLET | Freq: Every day | ORAL | 0 refills | Status: AC

## 2016-03-18 NOTE — Telephone Encounter (Signed)
Patient informed of Prednisone.

## 2016-03-18 NOTE — Telephone Encounter (Signed)
Would you like to send an rx or have [name] schedule an appt to be evaluated?

## 2016-03-18 NOTE — Telephone Encounter (Signed)
She went to her PCP on 12/5 and was told she had a viral infection and bronchitis. They put her on Azithromycin. She says that it has helped some. She hasn't been coughing up as much green. However, she still has nasal congestion, headache, sinus drainage and cough. She wants to know if she cam be given something else. Please give her a call at [phone].  She uses [facility] in [place].

## 2016-03-18 NOTE — Telephone Encounter (Signed)
Please inform patient that she can add prednisone 10mg  one tablet one time per day for 5 days.

## 2016-03-23 ENCOUNTER — Encounter: Payer: Self-pay | Admitting: Physician Assistant

## 2016-03-23 ENCOUNTER — Ambulatory Visit (INDEPENDENT_AMBULATORY_CARE_PROVIDER_SITE_OTHER): Payer: Medicare Other | Admitting: Physician Assistant

## 2016-03-23 VITALS — BP 98/59 | HR 78 | Temp 96.9°F | Ht 69.0 in | Wt 107.5 lb

## 2016-03-23 DIAGNOSIS — J0100 Acute maxillary sinusitis, unspecified: Secondary | ICD-10-CM | POA: Diagnosis not present

## 2016-03-23 DIAGNOSIS — H1013 Acute atopic conjunctivitis, bilateral: Secondary | ICD-10-CM | POA: Diagnosis not present

## 2016-03-23 MED ORDER — PREDNISONE 10 MG (21) PO TBPK: ORAL_TABLET | ORAL | 0 refills | Status: DC

## 2016-03-23 MED ORDER — DOXYCYCLINE HYCLATE 100 MG PO TABS: 100.0000 mg | ORAL_TABLET | Freq: Two times a day (BID) | ORAL | 0 refills | Status: DC

## 2016-03-23 NOTE — Patient Instructions (Signed)

## 2016-03-25 ENCOUNTER — Other Ambulatory Visit: Payer: Self-pay | Admitting: Physician Assistant

## 2016-03-27 NOTE — Progress Notes (Signed)
BP (!) 98/59   Pulse 78   Temp (!) 96.9 F (36.1 C) (Oral)   Ht 5\' 9"  (1.753 m)   Wt 107 lb 8 oz (48.8 kg)   BMI 15.87 kg/m    Subjective:    Patient ID: Kristy Garza Kitchen, female    DOB: [birth year], 65 y.o.   MRN: [id number]  HPI: Kristy Garza is a 65 y.o. female presenting on 03/23/2016 for Sinusitis (pt here today c/o sinus pressure, itchy/watery eyes, coughing, runny nose and sneezing.)  Has cough, and congestion for a several days. Has no fever or chills.  Eyes are congestion and red. Also with eyes closed each morning.  OTC meds are no help.  Relevant past medical, surgical, family and social history reviewed and updated as indicated. Allergies and medications reviewed and updated.  Past Medical History:  Diagnosis Date  . Acid reflux   . Allergy   . Arthritis    ARTHRITIS IN NECK BY DR. Alroy Dust [facility]  . Asthma   . Interstitial cystitis   . Osteoporosis   . PONV (postoperative nausea and vomiting)   . Tinnitus   . Trigeminal neuralgia    Atypical trigeminal neuralgia    Past Surgical History:  Procedure Laterality Date  . ABDOMINAL HYSTERECTOMY  2000  . CYSTOSCOPY WITH HYDRODISTENSION AND BIOPSY N/A 06/11/2012   Procedure: CYSTOSCOPY/BIOPSY/HYDRODISTENSION with Instillation of Pyridium and Marcaine and Kenalog;  Surgeon: Ailene Rud, MD;  Location: [facility];  Service: Urology;  Laterality: N/A;  1 hour requested for this case  BLADDER BIOPSY  . ETHMOIDECTOMY  2012  . SEPTOPLASTY  1980's  . vocal cord surgery   1990's   polyp removal    Review of Systems  Constitutional: Positive for chills and fatigue. Negative for activity change and appetite change.  HENT: Positive for congestion, postnasal drip and sore throat.   Eyes: Negative.   Respiratory: Positive for cough and wheezing.   Cardiovascular: Negative.  Negative for chest pain, palpitations and leg swelling.  Gastrointestinal: Negative.   Genitourinary: Negative.     Musculoskeletal: Negative.   Skin: Negative.   Neurological: Positive for headaches.    Allergies as of 03/23/2016      Reactions   Cefuroxime Axetil Other (See Comments)   Extreme gas   Augmentin [amoxicillin-pot Clavulanate] Diarrhea   Avelox [moxifloxacin Hcl In Nacl] Other (See Comments)   Tremors   Biaxin [clarithromycin]    Unsure of reaction   Cedax [ceftibuten] Other (See Comments)   tremors   Ciprofloxacin Other (See Comments)   Blurred vision   Clindamycin/lincomycin    tachycardia   Gabapentin    Constipation   Levofloxacin Other (See Comments)   Feels dehydrated   Lyrica [pregabalin]    Drowsiness, dry mouth   Singulair [montelukast Sodium] Other (See Comments)   Numbness and tingling in hand.   Sulfonamide Derivatives Other (See Comments)   Gi upset      Medication List       Accurate as of 03/23/16 11:59 PM. Always use your most recent med list.          acetaminophen 500 MG tablet Commonly known as:  TYLENOL Take by mouth.   albuterol 108 (90 Base) MCG/ACT inhaler Commonly known as:  PROAIR HFA Inhale 2 puffs into the lungs every 4 (four) hours as needed for wheezing or shortness of breath.   b complex vitamins capsule Take 1 capsule by mouth daily.   beclomethasone 80 MCG/ACT  inhaler Commonly known as:  QVAR Inhale 1 puff into the lungs 2 (two) times daily as needed.   DELSYM PO Take by mouth.   doxycycline 100 MG tablet Commonly known as:  VIBRA-TABS Take 1 tablet (100 mg total) by mouth 2 (two) times daily.   escitalopram 10 MG tablet Commonly known as:  LEXAPRO Take 1 tablet (10 mg total) by mouth daily.   Ferrous Sulfate Dried 200 (65 Fe) MG Tabs Take 65 mg by mouth daily.   fluticasone 50 MCG/ACT nasal spray Commonly known as:  FLONASE Place 2 sprays into both nostrils daily.   Fluticasone Furoate 200 MCG/ACT Aepb Commonly known as:  ARNUITY ELLIPTA Inhale 1 Dose into the lungs daily. Rinse, gargle, and spit after  use.   GAS-X PO Take by mouth.   guaiFENesin 600 MG 12 hr tablet Commonly known as:  MUCINEX Take 600 mg by mouth 2 (two) times daily as needed.   lidocaine 2 % jelly Commonly known as:  XYLOCAINE Apply topically as needed.   LINZESS 290 MCG Caps capsule Generic drug:  linaclotide TAKE 1 CAPSULE (290 MCG TOTAL) BY MOUTH DAILY.   pantoprazole 40 MG tablet Commonly known as:  PROTONIX Take by mouth.   phenylephrine 10 MG Tabs tablet Commonly known as:  SUDAFED PE Take 10 mg by mouth every 4 (four) hours as needed.   PHYTOMULTI Tabs Take by mouth daily.   predniSONE 10 MG tablet Commonly known as:  DELTASONE Take 1 tablet (10 mg total) by mouth daily with breakfast.   predniSONE 10 MG (21) Tbpk tablet Commonly known as:  STERAPRED UNI-PAK 21 TAB As directed x 6 days   PROBIOTIC PO Take by mouth daily.   ranitidine 300 MG tablet Commonly known as:  ZANTAC Take 1 tablet (300 mg total) by mouth at bedtime.   traMADol 50 MG tablet Commonly known as:  ULTRAM Take 1 tablet (50 mg total) by mouth every 6 (six) hours as needed.   XIIDRA 5 % Soln Generic drug:  Lifitegrast INSTILL 1 DROP BY OPHTHALMIC ROUTE 2 TIMES EVERY DAY INTO BOTH EYES APPROXIMATELY 12 HOURS APART          Objective:    BP (!) 98/59   Pulse 78   Temp (!) 96.9 F (36.1 C) (Oral)   Ht 5\' 9"  (1.753 m)   Wt 107 lb 8 oz (48.8 kg)   BMI 15.87 kg/m   Allergies  Allergen Reactions  . Cefuroxime Axetil Other (See Comments)    Extreme gas  . Augmentin [Amoxicillin-Pot Clavulanate] Diarrhea  . Avelox [Moxifloxacin Hcl In Nacl] Other (See Comments)    Tremors   . Biaxin [Clarithromycin]     Unsure of reaction  . Cedax [Ceftibuten] Other (See Comments)    tremors  . Ciprofloxacin Other (See Comments)    Blurred vision  . Clindamycin/Lincomycin     tachycardia  . Gabapentin     Constipation  . Levofloxacin Other (See Comments)    Feels dehydrated  . Lyrica [Pregabalin]     Drowsiness,  dry mouth  . Singulair [Montelukast Sodium] Other (See Comments)    Numbness and tingling in hand.  . Sulfonamide Derivatives Other (See Comments)    Gi upset    Physical Exam  Constitutional: She is oriented to person, place, and time. She appears well-developed and well-nourished.  HENT:  Head: Normocephalic and atraumatic.  Right Ear: A middle ear effusion is present.  Left Ear: A middle ear effusion is present.  Nose: Mucosal edema present. Right sinus exhibits no frontal sinus tenderness. Left sinus exhibits no frontal sinus tenderness.  Mouth/Throat: Posterior oropharyngeal erythema present. No oropharyngeal exudate or tonsillar abscesses.  Eyes: Conjunctivae and EOM are normal. Pupils are equal, round, and reactive to light.  Neck: Normal range of motion.  Cardiovascular: Normal rate, regular rhythm, normal heart sounds and intact distal pulses.   Pulmonary/Chest: Effort normal and breath sounds normal.  Abdominal: Soft. Bowel sounds are normal.  Neurological: She is alert and oriented to person, place, and time. She has normal reflexes.  Skin: Skin is warm and dry. No rash noted.  Psychiatric: She has a normal mood and affect. Her behavior is normal. Judgment and thought content normal.  Nursing note and vitals reviewed.       Assessment & Plan:   1. Acute non-recurrent maxillary sinusitis - doxycycline (VIBRA-TABS) 100 MG tablet; Take 1 tablet (100 mg total) by mouth 2 (two) times daily.  Dispense: 20 tablet; Refill: 0 - predniSONE (STERAPRED UNI-PAK 21 TAB) 10 MG (21) TBPK tablet; As directed x 6 days  Dispense: 21 tablet; Refill: 0  2. Allergic conjunctivitis of both eyes - predniSONE (STERAPRED UNI-PAK 21 TAB) 10 MG (21) TBPK tablet; As directed x 6 days  Dispense: 21 tablet; Refill: 0   Continue all other maintenance medications as listed above.  Follow up plan: Return if symptoms worsen or fail to improve.  Educational handout given for conjunctivitis  Terald Sleeper PA-C [facility] [address]  [place], [place] [address] [phone]   03/27/2016, 5:31 PM

## 2016-04-01 ENCOUNTER — Ambulatory Visit (INDEPENDENT_AMBULATORY_CARE_PROVIDER_SITE_OTHER): Payer: Medicare HMO | Admitting: Family Medicine

## 2016-04-01 ENCOUNTER — Telehealth: Payer: Self-pay | Admitting: Allergy and Immunology

## 2016-04-01 ENCOUNTER — Encounter: Payer: Self-pay | Admitting: Family Medicine

## 2016-04-01 ENCOUNTER — Ambulatory Visit: Payer: Medicare Other

## 2016-04-01 VITALS — BP 95/61 | HR 86 | Temp 98.3°F | Ht 69.0 in | Wt 105.0 lb

## 2016-04-01 DIAGNOSIS — J019 Acute sinusitis, unspecified: Secondary | ICD-10-CM

## 2016-04-01 MED ORDER — MOMETASONE FUROATE 50 MCG/ACT NA SUSP: 2.0000 | Freq: Every day | NASAL | 12 refills | Status: DC

## 2016-04-01 MED ORDER — AZITHROMYCIN 250 MG PO TABS
ORAL_TABLET | ORAL | 0 refills | Status: DC
Start: 2016-04-01 — End: 2016-04-07

## 2016-04-01 NOTE — Telephone Encounter (Signed)
Patient called and said she has been sick since December 5th. She is on her second round of antibiotics and has been to her primary care twice and cannot get rid of her congestion and coughing. Last saw Dr. Neldon Mc on 01-26-16.

## 2016-04-01 NOTE — Progress Notes (Signed)
BP 95/61   Pulse 86   Temp 98.3 F (36.8 C) (Oral)   Ht 5\' 9"  (1.753 m)   Wt 105 lb (47.6 kg)   BMI 15.51 kg/m    Subjective:    Patient ID: [name], female    DOB: Jan 04, 1951, 66 y.o.   MRN: [id number]  HPI: Kristy Garza is a 66 y.o. female presenting on 04/01/2016 for pressure in ears; Cough; Headache; and Facial Pain   HPI Sinus congestion and cough and headache and pressure in ears Patient has been having sinus congestion and cough and headache and pressure in her ears is been going on off and on for the past 2 days but she has been fighting this intermittently for months. She has been on multiple rounds of antibiotics including azithromycin and doxycycline and has multiple other allergies to antibiotics so that she cannot take them. She denies any shortness of breath or wheezing. She feels like the doxycycline did not help as much as the initial azithromycin.  Relevant past medical, surgical, family and social history reviewed and updated as indicated. Interim medical history since our last visit reviewed. Allergies and medications reviewed and updated.  Review of Systems  Constitutional: Negative for chills and fever.  HENT: Positive for congestion, postnasal drip, rhinorrhea, sinus pressure, sneezing and sore throat. Negative for ear discharge and ear pain.   Eyes: Negative for pain, redness and visual disturbance.  Respiratory: Positive for cough. Negative for chest tightness and shortness of breath.   Cardiovascular: Negative for chest pain and leg swelling.  Genitourinary: Negative for difficulty urinating and dysuria.  Musculoskeletal: Negative for back pain and gait problem.  Skin: Negative for rash.  Neurological: Negative for light-headedness and headaches.  Psychiatric/Behavioral: Negative for agitation and behavioral problems.  All other systems reviewed and are negative.   Per HPI unless specifically indicated above     Objective:    BP 95/61    Pulse 86   Temp 98.3 F (36.8 C) (Oral)   Ht 5\' 9"  (QA348G m)   Wt 105 lb (47.6 kg)   BMI 15.51 kg/m   Wt Readings from Last 3 Encounters:  04/01/16 105 lb (47.6 kg)  03/23/16 107 lb 8 oz (48.8 kg)  03/01/16 109 lb (49.4 kg)    Physical Exam  Constitutional: She is oriented to person, place, and time. She appears well-developed and well-nourished. No distress.  HENT:  Right Ear: Tympanic membrane, external ear and ear canal normal.  Left Ear: Tympanic membrane, external ear and ear canal normal.  Nose: Mucosal edema and rhinorrhea present. No epistaxis. Right sinus exhibits no maxillary sinus tenderness and no frontal sinus tenderness. Left sinus exhibits no maxillary sinus tenderness and no frontal sinus tenderness.  Mouth/Throat: Uvula is midline and mucous membranes are normal. Posterior oropharyngeal edema and posterior oropharyngeal erythema present. No oropharyngeal exudate or tonsillar abscesses.  Eyes: Conjunctivae are normal.  Cardiovascular: Normal rate, regular rhythm, normal heart sounds and intact distal pulses.   No murmur heard. Pulmonary/Chest: Effort normal and breath sounds normal. No respiratory distress. She has no wheezes. She has no rales.  Musculoskeletal: Normal range of motion. She exhibits no edema or tenderness.  Neurological: She is alert and oriented to person, place, and time. Coordination normal.  Skin: Skin is warm and dry. No rash noted. She is not diaphoretic.  Psychiatric: She has a normal mood and affect. Her behavior is normal.  Vitals reviewed.     Assessment & Plan:  Problem List Items Addressed This Visit    None    Visit Diagnoses    Acute rhinosinusitis    -  Primary   Relevant Medications   azithromycin (ZITHROMAX) 250 MG tablet   mometasone (NASONEX) 50 MCG/ACT nasal spray       Follow up plan: Return if symptoms worsen or fail to improve.  Counseling provided for all of the vaccine components No orders of the defined types  were placed in this encounter.   Caryl Pina, MD [facility] 04/01/2016, 5:20 PM

## 2016-04-01 NOTE — Telephone Encounter (Signed)
Patient will need to schedule and appointment with Dr. Neldon Mc.

## 2016-04-07 ENCOUNTER — Encounter: Payer: Self-pay | Admitting: Allergy & Immunology

## 2016-04-07 ENCOUNTER — Ambulatory Visit (INDEPENDENT_AMBULATORY_CARE_PROVIDER_SITE_OTHER): Payer: Medicare HMO | Admitting: Allergy & Immunology

## 2016-04-07 VITALS — BP 90/60 | HR 81 | Temp 98.1°F | Resp 17 | Ht 67.0 in | Wt 105.6 lb

## 2016-04-07 DIAGNOSIS — J329 Chronic sinusitis, unspecified: Secondary | ICD-10-CM

## 2016-04-07 DIAGNOSIS — J4530 Mild persistent asthma, uncomplicated: Secondary | ICD-10-CM | POA: Diagnosis not present

## 2016-04-07 DIAGNOSIS — K219 Gastro-esophageal reflux disease without esophagitis: Secondary | ICD-10-CM

## 2016-04-07 MED ORDER — AMOXICILLIN-POT CLAVULANATE 875-125 MG PO TABS: 1.0000 | ORAL_TABLET | Freq: Two times a day (BID) | ORAL | 0 refills | Status: AC

## 2016-04-07 MED ORDER — AZELASTINE HCL 0.1 % NA SOLN: 2.0000 | Freq: Two times a day (BID) | NASAL | 5 refills | Status: DC

## 2016-04-07 NOTE — Patient Instructions (Addendum)
1. Chronic sinusitis - Start Astelin nasal spray one spray per nostril 1-2 times daily. - Continue with Nasonex 1-2 sprays per nostril daily. - We will give you a prescription for Augmentin 875mg  twice daily to fill if your symptoms do not improve. - Continue with Allegra once daily.   2. Mild persistent asthma, uncomplicated - Lung function looked normal today. - Daily controller medication(s): Qvar 60mcg two puffs twice daily via a spacer - Rescue medications: ProAir 4 puffs every 4-6 hours as needed - Changes during respiratory infections or worsening symptoms: increase Qvar 40mcg  to 4 puffs twice daily for TWO WEEKS. - Asthma control goals:  * Full participation in all desired activities (may need albuterol before activity) * Albuterol use two time or less a week on average (not counting use with activity) * Cough interfering with sleep two time or less a month * Oral steroids no more than once a year * No hospitalizations  3. GERD - Continue with Protonix as well as ranitidine.   4. Return in about 3 months (around 07/06/2016).  Please inform us of any Emergency Department visits, hospitalizations, or changes in symptoms. Call us before going to the ED for breathing or allergy symptoms since we might be able to fit you in for a sick visit. Feel free to contact us anytime with any questions, problems, or concerns.  It was a pleasure to meet you today! Best wishes in the [place] Year!   Websites that have reliable patient information: 1. American Academy of Asthma, Allergy, and Immunology: www.aaaai.org 2. Food Allergy Research and Education (FARE): foodallergy.org 3. Mothers of Asthmatics: http://www.asthmacommunitynetwork.org 4. American College of Allergy, Asthma, and Immunology: www.acaai.org

## 2016-04-07 NOTE — Progress Notes (Signed)
FOLLOW UP  Date of Service/Encounter:  04/07/16   Assessment:   Mild persistent asthma  Chronic sinusitis  Gastroesophageal reflux disease   Asthma Reportables:  Severity: mild persistent  Risk: low Control: not well controlled  Seasonal Influenza Vaccine: yes   PPSV-23 Vaccine (CDC recommended for patients with persistent asthma 19-66yo): No    Plan/Recommendations:   1. Chronic sinusitis (followed by Dr. Izora Gala for ENT) - Start Astelin nasal spray one spray per nostril 1-2 times daily. - Continue with Nasonex 1-2 sprays per nostril daily. - We will give you a prescription for Augmentin 875mg  twice daily to fill if your symptoms do not improve. - Continue with Allegra once daily.  - I did offer a sinus CT but the patient declined.  - CXR in Feb 2017 showed mild bibasilar subsegmental atelectasis and mild cardiomegaly.  - The last sinus CT showed chronic frontal and ethmoid sinus disease without air fluid levels as well as a rightward nasal deviation (June 2015)  2. Mild persistent asthma, uncomplicated - Lung function looked normal today. - Daily controller medication(s): Qvar 13mcg two puffs twice daily via a spacer - Rescue medications: ProAir 4 puffs every 4-6 hours as needed - Changes during respiratory infections or worsening symptoms: increase Qvar 12mcg  to 4 puffs twice daily for TWO WEEKS. - Asthma control goals:  * Full participation in all desired activities (may need albuterol before activity) * Albuterol use two time or less a week on average (not counting use with activity) * Cough interfering with sleep two time or less a month * Oral steroids no more than once a year * No hospitalizations  3. GERD - Continue with Protonix as well as ranitidine.   4. Return in about 3 months (around 07/06/2016).   Subjective:   Kristy Garza is a 66 y.o. female presenting today for follow up of  Chief Complaint  Patient presents with  . Cough   for 2 weeks- chest tightness- mostly dry- sometimes productive yellowish  . Nasal Congestion    2-3 weeks- clear now- with headache    NJERI ONORATO has a history of the following: Patient Active Problem List   Diagnosis Date Noted  . Anxiety state 01/19/2016  . Adjustment disorder with anxious mood 01/19/2016  . HLD (hyperlipidemia) 01/07/2016  . IBS (irritable bowel syndrome) 07/11/2015  . Functional constipation 06/22/2015  . Chronic bronchitis ([place]) 05/07/2015  . 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 Trigeminal neuralgia 04/17/2014  . Face pain 01/22/2014  . 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 Asthma, chronic 05/13/2013  . Generalized anxiety disorder 05/13/2013  . Difficulty speaking 04/11/2013  . 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 Leg cramps 03/23/2013  . Acid reflux 03/11/2013  . Osteoporosis with pathological fracture 01/30/2013  . Abdominal pain 01/16/2013  . Back ache 01/16/2013  . Chronic interstitial cystitis 01/16/2013  . FOM (frequency of micturition) 01/16/2013  . Seasonal allergic rhinitis 07/07/2012  . G E R D 08/15/2007  . VOCAL CORD POLYP, HX OF 08/15/2007    History obtained from: chart review and patient.  [name] was referred by Kenn File, MD.     Cornisha is a 66 y.o. female presenting for a sick visit. She was last seen in October 2017 by Dr. Neldon Mc. At that time, she had not been seen in several years and was complaining of recalcitrant coughing. She had been evaluated by multiple providers in the past with many treatments tried without improvement with  any of the medication regimens. She was also concerned with food allergies and has a list of 20+ foods that she avoids secondary to testing performed at a chiropractor's integrative medicine practice at some point. She has an extensive list of drug sensitivities which limits her treatment options. At the last visit, she was encouraged to use medications for GERD including Protonix as  well as ranitidine. She was started on Flonase 1-2 sprays per nostril daily and Arnuity 282mcg once inhalation once daily.   She was switched to Nasonex with some relief. She started getting sick on December 5th. She was diagnosed with bronchitis and treated with five days of azithromycin. She was not given steroids at that time. She went back two weeks later and was on doxycycline. Then she came back and was placed on azithromycin once again as well as a prednisone burst. She was also given a course of prednisone from Dr. Neldon Mc. Therefore she has received nearly one month of prednisone without improvement. Of note, she does use a wood stove for heating purposes.   She is also complaining TMJ and jaw and teeth pain. She also has overbite as well as arthritis. She is supposed to wear a mouthguard. 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   Otherwise, there have been no changes to her past medical history, surgical history, family history, or social history.    Review of Systems: a 14-point review of systems is pertinent for what is mentioned in HPI.  Otherwise, all other systems were negative. Constitutional: negative other than that listed in the HPI Eyes: negative other than that listed in the HPI Ears, nose, mouth, throat, and face: negative other than that listed in the HPI Respiratory: negative other than that listed in the HPI Cardiovascular: negative other than that listed in the HPI Gastrointestinal: negative other than that listed in the HPI Genitourinary: negative other than that listed in the HPI Integument: negative other than that listed in the HPI Hematologic: negative other than that listed in the HPI Musculoskeletal: negative other than that listed in the HPI Neurological: negative other than that listed in the HPI Allergy/Immunologic: negative other than that listed in the HPI    Objective:   Blood pressure 90/60, pulse 81, temperature 98.1 F (36.7  C), temperature source Oral, resp. rate 17, height 5\' 7"  (1.702 m), weight 105 lb 9.6 oz (47.9 kg), SpO2 93 %. Body mass index is 16.54 kg/m.   Physical Exam:  General: Alert, interactive, in no acute distress. Somewhat frail appearing.  Eyes: No conjunctival injection present on the right, No conjunctival injection present on the left, PERRL bilaterally, No discharge on the right and No discharge on the left Ears: Right TM pearly gray with normal light reflex, Left TM pearly gray with normal light reflex, Right TM intact without perforation and Left TM intact without perforation.  Nose/Throat: Rightward septal deviation and External nose within normal limits, turbinates non-edematous with clear discharge, post-pharynx erythematous without cobblestoning in the posterior oropharynx. Tonsils 2+ without exudates Neck: Supple without thyromegaly. Lungs: Clear to auscultation without wheezing, rhonchi or rales. No increased work of breathing. CV: Normal S1/S2, no murmurs. Capillary refill <2 seconds.  Skin: Warm and dry, without lesions or rashes. Neuro:   Grossly intact. No focal deficits appreciated. Responsive to questions.   Diagnostic studies:  Spirometry: results normal (FEV1: 2.75/108%, FVC: 3.77/117%, FEV1/FVC: 73%).    Spirometry consistent with normal pattern.   Allergy Studies: None     Fara Olden  Ernst Bowler, MD [facility] [place]

## 2016-04-26 ENCOUNTER — Other Ambulatory Visit: Payer: Self-pay | Admitting: Physician Assistant

## 2016-04-26 ENCOUNTER — Telehealth: Payer: Self-pay | Admitting: Allergy & Immunology

## 2016-04-26 NOTE — Telephone Encounter (Signed)
Please advise 

## 2016-04-26 NOTE — Telephone Encounter (Signed)
I would recommend that she be evaluated again since it has been so long. There might be something more serious going on such as pneumonia that needs more than a refill on an antibiotic.   Salvatore Marvel, MD [place] [facility] [place]

## 2016-04-26 NOTE — Telephone Encounter (Signed)
Patient saw Dr. Ernst Bowler 04-07-16 and said she is still having chest congestion and coughing. She wants to know if she could get another Z Pac called in and some prednisone. [facility] [place].

## 2016-04-26 NOTE — Telephone Encounter (Signed)
Informed pt of what dr g said pt is going to make an appointment

## 2016-04-27 ENCOUNTER — Encounter: Payer: Self-pay | Admitting: Allergy and Immunology

## 2016-04-27 ENCOUNTER — Ambulatory Visit
Admission: RE | Admit: 2016-04-27 | Discharge: 2016-04-27 | Disposition: A | Discharge status: Home / Self Care | Payer: Medicare HMO | Source: Ambulatory Visit | Attending: Allergy and Immunology | Admitting: Allergy and Immunology

## 2016-04-27 ENCOUNTER — Ambulatory Visit (INDEPENDENT_AMBULATORY_CARE_PROVIDER_SITE_OTHER): Payer: Medicare HMO | Admitting: Allergy and Immunology

## 2016-04-27 VITALS — BP 90/60 | HR 71 | Resp 20

## 2016-04-27 DIAGNOSIS — R51 Headache: Secondary | ICD-10-CM

## 2016-04-27 DIAGNOSIS — K219 Gastro-esophageal reflux disease without esophagitis: Secondary | ICD-10-CM

## 2016-04-27 DIAGNOSIS — R519 Headache, unspecified: Secondary | ICD-10-CM

## 2016-04-27 DIAGNOSIS — J3089 Other allergic rhinitis: Secondary | ICD-10-CM | POA: Diagnosis not present

## 2016-04-27 DIAGNOSIS — J4541 Moderate persistent asthma with (acute) exacerbation: Secondary | ICD-10-CM | POA: Diagnosis not present

## 2016-04-27 DIAGNOSIS — R05 Cough: Secondary | ICD-10-CM | POA: Diagnosis not present

## 2016-04-27 DIAGNOSIS — G500 Trigeminal neuralgia: Secondary | ICD-10-CM

## 2016-04-27 MED ORDER — TIOTROPIUM BROMIDE MONOHYDRATE 1.25 MCG/ACT IN AERS: 2.0000 | INHALATION_SPRAY | Freq: Every day | RESPIRATORY_TRACT | 5 refills | Status: DC

## 2016-04-27 NOTE — Patient Instructions (Addendum)
  1. Treat reflux:   A. Continue off all caffeine including Excedrin  B. Protonix 40 mg once a day  C. ranitidine 300 mg in the evening  2. Treat inflammation:   A. Flonase one-2 sprays each nostril one time per day  B. Arnuity 200 one inhalation 1 time per day  C. START Spiriva Respimat 1.25 two inhalations one time per day  3. If needed:   A. nasal saline  B. ProAir HFA 2 puffs every 4-6 hours  C. OTC antihistamine, Azelastine  D. OTC decongestant tablet, OTC Mucinex  4. Obtain Chest X-ray  5. Return to clinic in 3 weeks or earlier if problem

## 2016-04-27 NOTE — Progress Notes (Signed)
Follow-up Note  Referring Provider: Timmothy Euler, MD Primary Provider: Kenn File, MD Date of Office Visit: 04/27/2016  Subjective:   Kristy Garza (DOB: 28-Sep-1950) is a 66 y.o. female who returns to the [facility] on 04/27/2016 in re-evaluation of the following:  HPI: Dnylah returns to this clinic in reevaluation of persistent respiratory tract symptoms that have been present for approximately 2 months. She has been treated with azithromycin, doxycycline, and azithromycin once again. Her last dose of azithromycin was in January. She has seen multiple physicians for this issue and it sounds as though she's also received systemic steroids during this timeframe. Nothing has really helped her very much and she still continues to have cough and postnasal drip and intermittent sore throat and some intermittent nasal congestion without any ugly nasal discharge or anosmia.  She still continues to have very significant problems with trigeminal neuralgia and was evaluated at [facility] pain clinic and was offered some type of injections in her face which she cannot afford.  She does not think that she has much problems with reflux at this point in time and consistently use his medical therapy for this condition. She is using a combination of a proton pump inhibitor and H2 receptor blocker because she cannot tolerate high doses of proton pump inhibitor. She has tried to remain away from Excedrin as much as possible as well as other forms of caffeine. She still has lots of throat clearing and irritation in her throat even while using this plan.  Allergies as of 04/27/2016      Reactions   Cefuroxime Axetil Other (See Comments)   Extreme gas   Augmentin [amoxicillin-pot Clavulanate] Diarrhea   Avelox [moxifloxacin Hcl In Nacl] Other (See Comments)   Tremors   Biaxin [clarithromycin]    Unsure of reaction   Cedax [ceftibuten] Other (See Comments)   tremors   Ciprofloxacin  Other (See Comments)   Blurred vision   Clindamycin/lincomycin    tachycardia   Gabapentin    Constipation   Levofloxacin Other (See Comments)   Feels dehydrated   Lyrica [pregabalin]    Drowsiness, dry mouth   Singulair [montelukast Sodium] Other (See Comments)   Numbness and tingling in hand.   Sulfonamide Derivatives Other (See Comments)   Gi upset      Medication List      acetaminophen 500 MG tablet Commonly known as:  TYLENOL Take by mouth.   albuterol 108 (90 Base) MCG/ACT inhaler Commonly known as:  PROAIR HFA Inhale 2 puffs into the lungs every 4 (four) hours as needed for wheezing or shortness of breath.   azelastine 0.1 % nasal spray Commonly known as:  ASTELIN Place 2 sprays into both nostrils 2 (two) times daily. Use in each nostril as directed   b complex vitamins capsule Take 1 capsule by mouth daily.   benzonatate 200 MG capsule Commonly known as:  TESSALON TAKE 1 CAPSULE (200 MG TOTAL) BY MOUTH 2 (TWO) TIMES DAILY AS NEEDED FOR COUGH.   DELSYM PO Take by mouth.   doxycycline 100 MG tablet Commonly known as:  VIBRA-TABS Take 1 tablet (100 mg total) by mouth 2 (two) times daily.   escitalopram 10 MG tablet Commonly known as:  LEXAPRO Take 1 tablet (10 mg total) by mouth daily.   Ferrous Sulfate Dried 200 (65 Fe) MG Tabs Take 65 mg by mouth daily.   fluticasone 50 MCG/ACT nasal spray Commonly known as:  FLONASE Place 2  sprays into both nostrils daily.   Fluticasone Furoate 200 MCG/ACT Aepb Commonly known as:  ARNUITY ELLIPTA Inhale 1 Dose into the lungs daily. Rinse, gargle, and spit after use.   GAS-X PO Take by mouth.   guaiFENesin 600 MG 12 hr tablet Commonly known as:  MUCINEX Take 600 mg by mouth 2 (two) times daily as needed.   lidocaine 2 % jelly Commonly known as:  XYLOCAINE Apply topically as needed.   LINZESS 290 MCG Caps capsule Generic drug:  linaclotide TAKE 1 CAPSULE (290 MCG TOTAL) BY MOUTH DAILY.   mometasone 50  MCG/ACT nasal spray Commonly known as:  NASONEX Place 2 sprays into the nose daily.   OVER THE COUNTER MEDICATION Take 1 capsule by mouth 3 (three) times daily.   pantoprazole 40 MG tablet Commonly known as:  PROTONIX Take by mouth.   phenylephrine 10 MG Tabs tablet Commonly known as:  SUDAFED PE Take 10 mg by mouth every 4 (four) hours as needed.   PHYTOMULTI Tabs Take by mouth daily.   PROBIOTIC PO Take by mouth daily.   ranitidine 300 MG tablet Commonly known as:  ZANTAC Take 1 tablet (300 mg total) by mouth at bedtime.   traMADol 50 MG tablet Commonly known as:  ULTRAM Take 1 tablet (50 mg total) by mouth every 6 (six) hours as needed.   UNABLE TO FIND Take 1 capsule by mouth daily. Dv3- vitamin D 3 plus immune support   XIIDRA 5 % Soln Generic drug:  Lifitegrast INSTILL 1 DROP BY OPHTHALMIC ROUTE 2 TIMES EVERY DAY INTO BOTH EYES APPROXIMATELY 12 HOURS APART       Past Medical History:  Diagnosis Date  . Acid reflux   . Allergy   . Arthritis    ARTHRITIS IN NECK BY DR. Alroy Dust [facility]  . Asthma   . Interstitial cystitis   . Osteoporosis   . PONV (postoperative nausea and vomiting)   . Tinnitus   . Trigeminal neuralgia    Atypical trigeminal neuralgia    Past Surgical History:  Procedure Laterality Date  . ABDOMINAL HYSTERECTOMY  2000  . CYSTOSCOPY WITH HYDRODISTENSION AND BIOPSY N/A 06/11/2012   Procedure: CYSTOSCOPY/BIOPSY/HYDRODISTENSION with Instillation of Pyridium and Marcaine and Kenalog;  Surgeon: Ailene Rud, MD;  Location: [facility];  Service: Urology;  Laterality: N/A;  1 hour requested for this case  BLADDER BIOPSY  . ETHMOIDECTOMY  2012  . SEPTOPLASTY  1980's  . vocal cord surgery   1990's   polyp removal    Review of systems negative except as noted in HPI / PMHx or noted below:  Review of Systems  Constitutional: Negative.   HENT: Negative.   Eyes: Negative.   Respiratory: Negative.   Cardiovascular:  Negative.   Gastrointestinal: Negative.   Genitourinary: Negative.   Musculoskeletal: Negative.   Skin: Negative.   Neurological: Negative.   Endo/Heme/Allergies: Negative.   Psychiatric/Behavioral: Negative.      Objective:   Vitals:   04/27/16 1113  BP: 90/60  Pulse: 71  Resp: 20          Physical Exam  Constitutional: She is well-developed, well-nourished, and in no distress.  Slightly raspy voice, throat clearing  HENT:  Head: Normocephalic.  Right Ear: Tympanic membrane, external ear and ear canal normal.  Left Ear: Tympanic membrane, external ear and ear canal normal.  Nose: Nose normal. No mucosal edema or rhinorrhea.  Mouth/Throat: Uvula is midline, oropharynx is clear and moist and mucous membranes  are normal. No oropharyngeal exudate.  Eyes: Conjunctivae are normal.  Neck: Trachea normal. No tracheal tenderness present. No tracheal deviation present. No thyromegaly present.  Cardiovascular: Normal rate, regular rhythm, S1 normal, S2 normal and normal heart sounds.   No murmur heard. Pulmonary/Chest: Breath sounds normal. No stridor. No respiratory distress. She has no wheezes. She has no rales.  Musculoskeletal: She exhibits no edema.  Lymphadenopathy:       Head (right side): No tonsillar adenopathy present.       Head (left side): No tonsillar adenopathy present.    She has no cervical adenopathy.  Neurological: She is alert. Gait normal.  Skin: No rash noted. She is not diaphoretic. No erythema. Nails show no clubbing.  Psychiatric: Mood and affect normal.    Diagnostics:    Spirometry was performed and demonstrated an FEV1 of 2.62 at 103 % of predicted.    Assessment and Plan:   1. Asthma, not well controlled, moderate persistent, with acute exacerbation   2. Other allergic rhinitis   3. LPRD (laryngopharyngeal reflux disease)   4. Chronic daily headache   5. Trigeminal neuralgia     1. Treat reflux:   A. Continue off all caffeine including  Excedrin  B. Protonix 40 mg once a day  C. ranitidine 300 mg in the evening  2. Treat inflammation:   A. Flonase one-2 sprays each nostril one time per day  B. Arnuity 200 one inhalation 1 time per day  C. START Spiriva Respimat 1.25 two inhalations one time per day  3. If needed:   A. nasal saline  B. ProAir HFA 2 puffs every 4-6 hours  C. OTC antihistamine, Azelastine  D. OTC decongestant tablet, OTC Mucinex  4. Obtain Chest X-ray  5. Return to clinic in 3 weeks or earlier if problem  I do not think that we can assume that Bobbijo is having persistent cough from her LPR without first obtaining a chest x-ray to rule out other possibilities. She has already been treated with multiple antibiotics and systemic steroids to address bacterial infection and inflammation of her respiratory tract and I don't think she needs to have any of these administered at this point until we can get a chest x-ray. I will start her on a inhaled anticholinergic agent in addition to her inhaled steroid as noted above. She will continue on aggressive therapy directed against reflux as noted above. She will follow up with [facility] pain clinic regarding further management of her trigeminal neuralgia.  Allena Katz, MD [place]

## 2016-05-03 ENCOUNTER — Telehealth: Payer: Self-pay | Admitting: *Deleted

## 2016-05-03 MED ORDER — PREDNISONE 10 MG PO TABS: 5.0000 mg | ORAL_TABLET | Freq: Every day | ORAL | 0 refills | Status: DC

## 2016-05-03 NOTE — Addendum Note (Signed)
Addended by: Angelica Ran on: 05/03/2016 02:50 PM   Modules accepted: Orders

## 2016-05-03 NOTE — Telephone Encounter (Signed)
Informed patient of Prednisone.

## 2016-05-03 NOTE — Telephone Encounter (Signed)
Please provide patient with prednisone 10mg  1/2 tablet one time per day for 10 days

## 2016-05-03 NOTE — Telephone Encounter (Signed)
Informed patient of chest x-ray results. She states that she still isn't feeling well and thinks she needs some low dose Prednisone. Please advise.

## 2016-05-05 ENCOUNTER — Telehealth: Payer: Self-pay

## 2016-05-05 ENCOUNTER — Telehealth: Payer: Self-pay | Admitting: Allergy and Immunology

## 2016-05-05 DIAGNOSIS — M1990 Unspecified osteoarthritis, unspecified site: Secondary | ICD-10-CM

## 2016-05-05 NOTE — Telephone Encounter (Signed)
Yes okay to go ahead and place this referral for her. Diagnosis arthritis in the neck

## 2016-05-05 NOTE — Telephone Encounter (Signed)
Wants a referral to PT in [place] for arthritis in neck. Please place referral.   [phone] phone  Kristy Garza

## 2016-05-05 NOTE — Telephone Encounter (Signed)
Patient saw Dr. Neldon Mc 04-27-16. He said he was going to prescribe her prednisone, 10 mg for 10 days. She said when she picked up her prescription it is only 5 mg for 5 days. She doesn't think that is enough.

## 2016-05-05 NOTE — Telephone Encounter (Signed)
Referral placed and pt is aware. 

## 2016-05-06 ENCOUNTER — Other Ambulatory Visit: Payer: Self-pay | Admitting: Allergy and Immunology

## 2016-05-11 DIAGNOSIS — M26629 Arthralgia of temporomandibular joint, unspecified side: Secondary | ICD-10-CM | POA: Diagnosis not present

## 2016-05-11 DIAGNOSIS — J309 Allergic rhinitis, unspecified: Secondary | ICD-10-CM | POA: Diagnosis not present

## 2016-05-11 DIAGNOSIS — J324 Chronic pansinusitis: Secondary | ICD-10-CM | POA: Diagnosis not present

## 2016-05-11 DIAGNOSIS — M26622 Arthralgia of left temporomandibular joint: Secondary | ICD-10-CM | POA: Diagnosis not present

## 2016-05-14 ENCOUNTER — Ambulatory Visit (INDEPENDENT_AMBULATORY_CARE_PROVIDER_SITE_OTHER): Payer: Medicare HMO | Admitting: Family Medicine

## 2016-05-14 VITALS — BP 95/65 | HR 60 | Temp 96.6°F | Ht 69.0 in | Wt 105.0 lb

## 2016-05-14 DIAGNOSIS — M26609 Unspecified temporomandibular joint disorder, unspecified side: Secondary | ICD-10-CM | POA: Diagnosis not present

## 2016-05-14 DIAGNOSIS — R69 Illness, unspecified: Secondary | ICD-10-CM | POA: Diagnosis not present

## 2016-05-14 DIAGNOSIS — R519 Headache, unspecified: Secondary | ICD-10-CM

## 2016-05-14 DIAGNOSIS — F411 Generalized anxiety disorder: Secondary | ICD-10-CM | POA: Diagnosis not present

## 2016-05-14 DIAGNOSIS — R51 Headache: Secondary | ICD-10-CM | POA: Diagnosis not present

## 2016-05-14 MED ORDER — TIZANIDINE HCL 2 MG PO TABS: 2.0000 mg | ORAL_TABLET | Freq: Three times a day (TID) | ORAL | 0 refills | Status: DC | PRN

## 2016-05-14 MED ORDER — DOXYCYCLINE HYCLATE 100 MG PO TABS: 100.0000 mg | ORAL_TABLET | Freq: Two times a day (BID) | ORAL | 0 refills | Status: DC

## 2016-05-14 MED ORDER — ESCITALOPRAM OXALATE 20 MG PO TABS: 20.0000 mg | ORAL_TABLET | Freq: Every day | ORAL | 3 refills | Status: DC

## 2016-05-14 NOTE — Patient Instructions (Signed)
Great to see you!  Try tizandine for muscle relaxer for TMJ pain.   Doxycyline is for your sinuses  I have also increased lexapro to 20 mg once daily

## 2016-05-14 NOTE — Progress Notes (Signed)
   HPI  Patient presents today today with several insertions, after hours visit, Saturday morning.  Patient explains that she was seen by ear nose and throat this week, she was told she has TMJ and told to request a muscle relaxer. Flexeril causes extreme sedation for her.  Patient also complains of frontal headache that's been going on for weeks. She states it's a mild, she also has mild cough which is productive of thick green sputum. She says she was given azithromycin in December with good improvement.  She states she can actually can tolerate Augmentin, amoxicillin. Doxycycline she reports does not do anything for her.  Anxiety Has improved with Lexapro, would like to increase to 20 mg once daily. Denies any depression.   PMH: Smoking status noted ROS: Per HPI  Objective: BP 95/65   Pulse 60   Temp (!) 96.6 F (35.9 C) (Oral)   Ht 5\' 9"  (1.753 m)   Wt 105 lb (47.6 kg)   BMI 15.51 kg/m  Gen: NAD, alert, cooperative with exam HEENT: NCAT, nares clear, mild tenderness to palpation of the frontal sinuses, oropharynx moist and clear CV: RRR, good S1/S2, no murmur Resp: CTABL, no wheezes, non-labored Abd: SNTND, BS present, no guarding or organomegaly Ext: No edema, warm Neuro: Alert and oriented, No gross deficits  Assessment and plan:  # Headache Initially I felt this was a sinus infection, however she was seen by ENT this week and she's had symptoms for several weeks. Symptoms are described as mild. Tenderness on palpation is mild Offered doxycycline, in fact I sent this to her pharmacy for her, however she states this does not help her. For now I recommended watchful waiting and conservative therapy. Likely related to TMJ, hopefully will have improvement with ENT treatment and muscle relaxers   # TMJ syndrome Stable Tizanidine sent, patient cannot tolerate Flexeril due to sedation Her ENT also recommended NSAIDs. Could consider Cymbalta  # GAD Patient  reports good improvement with Lexapro 10 mg, titrated 20 mg   Meds ordered this encounter  Medications  . tiZANidine (ZANAFLEX) 2 MG tablet    Sig: Take 1 tablet (2 mg total) by mouth every 8 (eight) hours as needed for muscle spasms.    Dispense:  30 tablet    Refill:  0  . escitalopram (LEXAPRO) 20 MG tablet    Sig: Take 1 tablet (20 mg total) by mouth daily.    Dispense:  90 tablet    Refill:  3  . doxycycline (VIBRA-TABS) 100 MG tablet    Sig: Take 1 tablet (100 mg total) by mouth 2 (two) times daily. 1 po bid    Dispense:  20 tablet    Refill:  0    Kristy Apple, MD [facility] 05/14/2016, 9:45 AM

## 2016-05-17 ENCOUNTER — Ambulatory Visit: Payer: Self-pay | Admitting: Internal Medicine

## 2016-05-17 ENCOUNTER — Ambulatory Visit (INDEPENDENT_AMBULATORY_CARE_PROVIDER_SITE_OTHER): Payer: Medicare HMO | Admitting: Internal Medicine

## 2016-05-17 ENCOUNTER — Encounter: Payer: Self-pay | Admitting: Internal Medicine

## 2016-05-17 DIAGNOSIS — J301 Allergic rhinitis due to pollen: Secondary | ICD-10-CM | POA: Diagnosis not present

## 2016-05-17 DIAGNOSIS — J42 Unspecified chronic bronchitis: Secondary | ICD-10-CM | POA: Diagnosis not present

## 2016-05-17 MED ORDER — UMECLIDINIUM BROMIDE 62.5 MCG/INH IN AEPB: 1.0000 | INHALATION_SPRAY | Freq: Every day | RESPIRATORY_TRACT | 0 refills | Status: DC

## 2016-05-17 NOTE — Patient Instructions (Addendum)
Sample Incruse inhaler     Inhale 1 puff, once daily    Try this instead of Spiriva  You can check with your insurance formulary to see which would be cheapest- Spiriva, Incruse, Atrovent HFA, or                                                                                                                                                          SeeBri  Please call as needed

## 2016-05-17 NOTE — Progress Notes (Signed)
HPI female never smoker (father smoked) followed for chronic cough, allergic rhinitis, COPD by CT (normal a1AT) CXR 01/30/13-Hyperinflation and severe emphysema is present Allergy profile 12/28/2012 negative with total IgE 4.5. a1AT 02/07/13- WNL MM, 167 allergy skin testing 08/28/13-Positive- grass, tree, weed, dust. Office spirometry wnl 04/27/16/ Dr Neldon Mc  -----------------------------------------------------------------------------------------------------------  05/17/2816-65 year old female never smoker (father smoked) followed for chronic cough, allergic rhinitis, COPD by CT (normal a1AT) FOLLOWS FOR:Pt was changed to Arnuity and Spiriva instead of QVAR by allergist. Continues to have trouble with cough-3 to 4 days now has noticed yellow in color with hemoptysis.   Thin body habitus and mild kyphoscoliosis habitual postures. Today complains of sore throat, cough since early December persisting despite several antibiotics and prednisone for bronchitis. Sputum recently has turned green with trace blood by her description. She still wants to wait on flu shot despite advice. Daily Excedrin for headache and TMJ. Spiriva not too expensive-discussed alternatives in that class. CXR 04/27/16 IMPRESSION: Marked hyperinflation consistent with known reactive airway disease. No pneumonia, CHF, nor other acute cardiopulmonary abnormality.  ROS-see HPI Constitutional:   No-   weight loss, night sweats, fevers, chills, fatigue, lassitude. HEENT:   + headaches, no-difficulty swallowing, +tooth/dental problems, +sore throat,       No-  sneezing, itching, ear ache, +nasal congestion, post nasal drip,  CV:  No-   chest pain, orthopnea, PND, swelling in lower extremities, anasarca, dizziness, palpitations Resp: No-   shortness of breath with exertion or at rest.              +  productive cough,  + non-productive cough,  + coughing up of blood.        + change in color of mucus.  No- wheezing.   Skin: No-    rash or lesions. GI:   No-heartburn, indigestion,  loss of appetite GU: . MS:  No-   joint pain or swelling.   Neuro-     nothing unusual Psych:  No- change in mood or affect. + depression or anxiety.  No memory loss.  OBJ- Physical Exam General- Alert, Oriented, Affect-appropriate, Distress- none acute, +thin. Sits hunched but can straighten. Skin- rash-none, lesions- none, excoriation- none Lymphadenopathy- none Head- atraumatic            Eyes- Gross vision intact, PERRLA, conjunctivae and secretions clear            Ears- Hearing, canals-normal            Nose- Clear, no-Septal dev, mucus, polyps, erosion, perforation             Throat- Mallampati II , mucosa+thrush , drainage- none, tonsils- atrophic.               +Much dental Repair/ teeth are worn,  Neck- flexible , trachea midline, no stridor , thyroid nl, carotid no bruit Chest - symmetrical excursion , unlabored           Heart/CV- RRR , no murmur , no gallop  , no rub, nl s1 s2                           - JVD- none , edema- none, stasis changes- none, varices- none           Lung- clear to P&A, wheeze- none, cough-none , dullness-none, rub- none. Long/thin  thorax.           Chest wall- + pectus + mild scoliosis/kyphosis Abd-  Br/ Gen/ Rectal- Not done, not indicated Extrem- cyanosis- none, clubbing, none, atrophy- none, strength- nl Neuro- grossly intact to observation

## 2016-05-22 NOTE — Assessment & Plan Note (Signed)
Office spirometry has shown normal airflows although imaging tends to indicate emphysema. a1AT wnl. Now with slight hemoptysis which is probably benign hemorrhagic, aggravated by her daily Excedrin. CXR 3 weeks ago did not suggest new process of concern. Plan-back off Excedrin at least until bleeding stops. Check insurance formulary for alternatives to Spiriva.

## 2016-05-22 NOTE — Assessment & Plan Note (Signed)
[facility]. There is always been a component of stress/anxiety/TMJ as well.

## 2016-05-25 ENCOUNTER — Telehealth: Payer: Self-pay | Admitting: *Deleted

## 2016-05-25 MED ORDER — TIOTROPIUM BROMIDE MONOHYDRATE 18 MCG IN CAPS: 18.0000 ug | ORAL_CAPSULE | Freq: Every day | RESPIRATORY_TRACT | 12 refills | Status: DC

## 2016-05-25 NOTE — Telephone Encounter (Signed)
Patient's insurance does not cover Spiriva Respimat. Switched patient to [employer].

## 2016-05-31 ENCOUNTER — Ambulatory Visit: Payer: Medicare HMO | Admitting: Allergy and Immunology

## 2016-06-05 ENCOUNTER — Encounter: Payer: Self-pay | Admitting: Physician Assistant

## 2016-06-07 ENCOUNTER — Telehealth: Payer: Self-pay | Admitting: Family Medicine

## 2016-06-07 NOTE — Telephone Encounter (Signed)
Patient reports nausea and vomiting has improved but she is feeling very weak and tired.  Has only eaten jello once, drank gatorade since yesterday.  I recommended to the patient if she is feeling very bad she should go to the ER because she may by dehydrated and needing fluids.  If she is not feeling bad enough to go, I recommended that she increase her diet to toast, jello, mashed potatoes, chicken broth and drink plenty of fluids.  Patient understands.

## 2016-06-11 ENCOUNTER — Other Ambulatory Visit: Payer: Self-pay | Admitting: Nurse Practitioner

## 2016-06-11 MED ORDER — PREDNISONE 20 MG PO TABS: ORAL_TABLET | ORAL | 0 refills | Status: DC

## 2016-06-17 ENCOUNTER — Telehealth: Payer: Self-pay | Admitting: Family Medicine

## 2016-06-17 NOTE — Telephone Encounter (Signed)
Appt scheduled for rck

## 2016-06-17 NOTE — Telephone Encounter (Signed)
Patient has been taking the Tizanidine for TMJ but it does not seem to be helping.  She cannot take Flexeril because it is too sedating.  She would like to know if there is another muscle relaxant she could try.  Please advise.

## 2016-06-21 ENCOUNTER — Ambulatory Visit (INDEPENDENT_AMBULATORY_CARE_PROVIDER_SITE_OTHER): Payer: Medicare HMO | Admitting: Family Medicine

## 2016-06-21 ENCOUNTER — Encounter: Payer: Self-pay | Admitting: Family Medicine

## 2016-06-21 VITALS — BP 92/62 | HR 81 | Temp 96.9°F | Ht 69.0 in | Wt 108.8 lb

## 2016-06-21 DIAGNOSIS — G500 Trigeminal neuralgia: Secondary | ICD-10-CM | POA: Diagnosis not present

## 2016-06-21 DIAGNOSIS — M26623 Arthralgia of bilateral temporomandibular joint: Secondary | ICD-10-CM

## 2016-06-21 DIAGNOSIS — F411 Generalized anxiety disorder: Secondary | ICD-10-CM

## 2016-06-21 DIAGNOSIS — R69 Illness, unspecified: Secondary | ICD-10-CM | POA: Diagnosis not present

## 2016-06-21 MED ORDER — DULOXETINE HCL 30 MG PO CPEP: 30.0000 mg | ORAL_CAPSULE | Freq: Every day | ORAL | 3 refills | Status: DC

## 2016-06-21 MED ORDER — TRAMADOL HCL 50 MG PO TABS: 50.0000 mg | ORAL_TABLET | Freq: Four times a day (QID) | ORAL | 1 refills | Status: DC | PRN

## 2016-06-21 MED ORDER — BENZONATATE 200 MG PO CAPS: 200.0000 mg | ORAL_CAPSULE | Freq: Two times a day (BID) | ORAL | 1 refills | Status: DC | PRN

## 2016-06-21 NOTE — Progress Notes (Signed)
   HPI  Patient presents today to discuss TMJ arthralgia.  Patient would like a refill on tramadol, she takes one half pill once or twice daily. She states this helps her neck pain, her TMJ pain, as well as her possible trigeminal neuralgia.  Patient is not taking Lexapro currently, she does have anxiety.  She would like to consider another muscle relaxer to help her jaw. She's seen several specialists including alternative medicine and specialist at [facility]. She has had glucose injections from alternative medicine which did not improve her pain, however did cost over $2000.  She denies any changes in her symptoms recently. She has had significant pain recently.  PMH: Smoking status noted ROS: Per HPI  Objective: BP 92/62   Pulse 81   Temp (!) 96.9 F (36.1 C) (Oral)   Ht '5\' 9"'$  (1.753 m)   Wt 108 lb 12.8 oz (49.4 kg)   BMI 16.07 kg/m  Gen: NAD, alert, cooperative with exam HEENT: NCAT, EOMI, PERRL CV: RRR, good S1/S2, no murmur Resp: CTABL, no wheezes, non-labored Ext: No edema, warm Neuro: Alert and oriented, No gross deficits  Assessment and plan:  # TMJ syndrome, arthralgia Trial of Cymbalta Discontinue tizanidine Patient has tried several treatments, hopeful this will help with multiple conditions.   # Anxiety Trying cymbalta, discussed F/u 3-4 weeks   # Trigeminal neuralgia Along with neck pain and TMJ she uses tramadol for pain with good effect.  Cymbalta as well.  Symptoms stable.      Orders Placed This Encounter  Procedures  . CBC with Differential/Platelet  . CMP14+EGFR    Meds ordered this encounter  Medications  . benzonatate (TESSALON) 200 MG capsule    Sig: Take 1 capsule (200 mg total) by mouth 2 (two) times daily as needed for cough.    Dispense:  40 capsule    Refill:  1  . traMADol (ULTRAM) 50 MG tablet    Sig: Take 1 tablet (50 mg total) by mouth every 6 (six) hours as needed.    Dispense:  30 tablet    Refill:  1   Not to exceed 5 additional fills before 02/07/2016.  [name] DULoxetine (CYMBALTA) 30 MG capsule    Sig: Take 1 capsule (30 mg total) by mouth daily.    Dispense:  30 capsule    Refill:  Stickney, MD [facility] 06/21/2016, 4:45 PM

## 2016-06-21 NOTE — Patient Instructions (Signed)
Great to see you!  Start cymbalta 1 pill once daily for pain and anxiety. Com ebcak in 3-4 weeks for follow up.

## 2016-06-22 LAB — CBC WITH DIFFERENTIAL/PLATELET
BASOS: 0 %
Basophils Absolute: 0 10*3/uL (ref 0.0–0.2)
EOS (ABSOLUTE): 0 10*3/uL (ref 0.0–0.4)
EOS: 0 %
HEMATOCRIT: 35.4 % (ref 34.0–46.6)
Hemoglobin: 11.6 g/dL (ref 11.1–15.9)
Immature Grans (Abs): 0 10*3/uL (ref 0.0–0.1)
Immature Granulocytes: 0 %
LYMPHS ABS: 0.5 10*3/uL — AB (ref 0.7–3.1)
Lymphs: 8 %
MCH: 31.4 pg (ref 26.6–33.0)
MCHC: 32.8 g/dL (ref 31.5–35.7)
MCV: 96 fL (ref 79–97)
MONOS ABS: 0.3 10*3/uL (ref 0.1–0.9)
Monocytes: 5 %
NEUTROS ABS: 5.8 10*3/uL (ref 1.4–7.0)
Neutrophils: 87 %
Platelets: 302 10*3/uL (ref 150–379)
RBC: 3.69 x10E6/uL — ABNORMAL LOW (ref 3.77–5.28)
RDW: 12.5 % (ref 12.3–15.4)
WBC: 6.7 10*3/uL (ref 3.4–10.8)

## 2016-06-22 LAB — CMP14+EGFR
A/G RATIO: 2.2 (ref 1.2–2.2)
ALBUMIN: 4.4 g/dL (ref 3.6–4.8)
ALK PHOS: 76 IU/L (ref 39–117)
ALT: 21 IU/L (ref 0–32)
AST: 22 IU/L (ref 0–40)
BUN / CREAT RATIO: 19 (ref 12–28)
BUN: 17 mg/dL (ref 8–27)
Bilirubin Total: 0.2 mg/dL (ref 0.0–1.2)
CO2: 26 mmol/L (ref 18–29)
Calcium: 9.4 mg/dL (ref 8.7–10.3)
Chloride: 98 mmol/L (ref 96–106)
Creatinine, Ser: 0.89 mg/dL (ref 0.57–1.00)
GFR calc Af Amer: 79 mL/min/{1.73_m2} (ref >59)
GFR calc non Af Amer: 68 mL/min/{1.73_m2} (ref >59)
GLOBULIN, TOTAL: 2 g/dL (ref 1.5–4.5)
Glucose: 110 mg/dL — ABNORMAL HIGH (ref 65–99)
POTASSIUM: 4.5 mmol/L (ref 3.5–5.2)
SODIUM: 138 mmol/L (ref 134–144)
Total Protein: 6.4 g/dL (ref 6.0–8.5)

## 2016-06-27 ENCOUNTER — Telehealth: Payer: Self-pay | Admitting: Family Medicine

## 2016-06-27 MED ORDER — BACLOFEN 10 MG PO TABS: 10.0000 mg | ORAL_TABLET | Freq: Three times a day (TID) | ORAL | 2 refills | Status: DC

## 2016-06-27 NOTE — Telephone Encounter (Signed)
I agree, Needs to be taken for a several weeks to determine no benefit.   I am ok with baclofen also, Rx sent.  Laroy Apple, MD [facility] 06/27/2016, 12:07 PM

## 2016-06-27 NOTE — Telephone Encounter (Signed)
Pt would like RX for Baclofen  Pt tried 1 dose of Cymbalta but states med was not effective Explained to pt that medication needed to be taken for longer period to be beneficial Please advise

## 2016-06-28 NOTE — Telephone Encounter (Signed)
Pt notified of recommendation Verbalizes understanding 

## 2016-07-05 ENCOUNTER — Ambulatory Visit: Payer: Medicare HMO | Admitting: Allergy and Immunology

## 2016-07-05 ENCOUNTER — Encounter: Payer: Self-pay | Admitting: Allergy and Immunology

## 2016-07-05 ENCOUNTER — Ambulatory Visit (INDEPENDENT_AMBULATORY_CARE_PROVIDER_SITE_OTHER): Payer: Medicare HMO | Admitting: Allergy and Immunology

## 2016-07-05 VITALS — BP 108/66 | HR 88 | Resp 18

## 2016-07-05 DIAGNOSIS — R51 Headache: Secondary | ICD-10-CM

## 2016-07-05 DIAGNOSIS — J4540 Moderate persistent asthma, uncomplicated: Secondary | ICD-10-CM | POA: Diagnosis not present

## 2016-07-05 DIAGNOSIS — J3089 Other allergic rhinitis: Secondary | ICD-10-CM | POA: Diagnosis not present

## 2016-07-05 DIAGNOSIS — G500 Trigeminal neuralgia: Secondary | ICD-10-CM | POA: Diagnosis not present

## 2016-07-05 DIAGNOSIS — J0140 Acute pansinusitis, unspecified: Secondary | ICD-10-CM

## 2016-07-05 DIAGNOSIS — K219 Gastro-esophageal reflux disease without esophagitis: Secondary | ICD-10-CM

## 2016-07-05 DIAGNOSIS — M26623 Arthralgia of bilateral temporomandibular joint: Secondary | ICD-10-CM

## 2016-07-05 DIAGNOSIS — R519 Headache, unspecified: Secondary | ICD-10-CM

## 2016-07-05 MED ORDER — ALBUTEROL SULFATE HFA 108 (90 BASE) MCG/ACT IN AERS
INHALATION_SPRAY | RESPIRATORY_TRACT | 1 refills | Status: DC
Start: 2016-07-05 — End: 2017-03-07

## 2016-07-05 NOTE — Patient Instructions (Addendum)
  1. Treat reflux:   A. Continue off all caffeine including Excedrin  B. Protonix 40 mg once a day  C. ranitidine 150-300 mg in the evening  2. Treat inflammation:   A. Flonase one-2 sprays each nostril one time per day  B. Arnuity 200 one inhalation 1 time per day (samples)  C. Incruse one inhalation one time per day (samples)  3. If needed:   A. nasal saline  B. ProAir HFA 2 puffs every 4-6 hours  C. OTC antihistamine, Azelastine  D. OTC decongestant tablet, OTC Mucinex DM  4. For this most recent event:   A. Nasal saline multiple times a day  B. Prednisone 10mg  - one tablet one time a day for 4 days only  C. Antibiotics?  5. Return to clinic in Summer 2018 or earlier if problem

## 2016-07-05 NOTE — Progress Notes (Signed)
Follow-up Note  Referring Provider: Timmothy Euler, MD Primary Provider: Kenn File, MD Date of Office Visit: 07/05/2016  Subjective:   Kristy Garza (DOB: 1951-02-19) is a 66 y.o. female who [facility] 07[facility]2016 in re-evaluation of the following:  HPI: Kristy Garza returns to this clinic in evaluation of an event that has occurred over the course of the past 24 hours. She has be evaluated in this clinic for a history of asthma/emphysema, reflux-induced respiratory disease, and allergic rhinitis and headaches.   She was last seen in this clinic January 2018 at which point in time we attempted to add Spiriva to her inhaled steroid dose to help with her cough. Unfortunately, she could not tolerate the use of Spiriva because of financial issue. She has continued to use Arnuity and overall she thinks that her lower airways were doing relatively well and she has not really been having that much cough while consistently using her Arnuity. Her requirement for short acting bronchodilator is less than once a week. She will use Mucinex DM or Tessalon Perles intermittently to help with her cough as well. Likewise, she was not really having a tremendous amount of problem with her upper airways while consistently using a nasal steroid.  Over the course of the past 24 hours she has developed a sore throat and some chest tightness and coughing and green nasal discharge and postnasal drip without any fever and without any chest pain.   She uses Excedrin about 1 time per week for her headache at this point. She still sees a very large collection of medical providers for her combination of TMJ and trigeminal neuralgia. She has recently been started on Cymbalta which surprisingly she is able to tolerate.  Her reflux appears to be doing relatively well on her Protonix and ranitidine combination.   Allergies as of 07/05/2016      Reactions   Cefuroxime Axetil Other (See  Comments)   Extreme gas   Augmentin [amoxicillin-pot Clavulanate] Diarrhea   Avelox [moxifloxacin Hcl In Nacl] Other (See Comments)   Tremors   Biaxin [clarithromycin]    Unsure of reaction   Cedax [ceftibuten] Other (See Comments)   tremors   Ciprofloxacin Other (See Comments)   Blurred vision   Clindamycin/lincomycin    tachycardia   Gabapentin    Constipation   Levofloxacin Other (See Comments)   Feels dehydrated   Lyrica [pregabalin]    Drowsiness, dry mouth   Singulair [montelukast Sodium] Other (See Comments)   Numbness and tingling in hand.   Sulfonamide Derivatives Other (See Comments)   Gi upset      Medication List      acetaminophen 500 MG tablet Commonly known as:  TYLENOL Take by mouth.   albuterol 108 (90 Base) MCG/ACT inhaler Commonly known as:  PROAIR HFA Inhale 2 puffs into the lungs every 4 (four) hours as needed for wheezing or shortness of breath.   ARNUITY ELLIPTA IN Inhale into the lungs.   azelastine 0.1 % nasal spray Commonly known as:  ASTELIN Place 2 sprays into both nostrils 2 (two) times daily. Use in each nostril as directed   b complex vitamins capsule Take 1 capsule by mouth daily.   baclofen 10 MG tablet Commonly known as:  LIORESAL Take 1 tablet (10 mg total) by mouth 3 (three) times daily. start with 1/2 pill TID, increase by 5 mg per dose until 10 mg TID   benzonatate 200 MG  capsule Commonly known as:  TESSALON Take 1 capsule (200 mg total) by mouth 2 (two) times daily as needed for cough.   DELSYM PO Take by mouth.   DULoxetine 30 MG capsule Commonly known as:  CYMBALTA Take 1 capsule (30 mg total) by mouth daily.   GAS-X PO Take by mouth.   guaiFENesin 600 MG 12 hr tablet Commonly known as:  MUCINEX Take 600 mg by mouth 2 (two) times daily as needed.   lidocaine 2 % jelly Commonly known as:  XYLOCAINE Apply topically as needed.   mometasone 50 MCG/ACT nasal spray Commonly known as:  NASONEX Place 2 sprays  into the nose daily.   OVER THE COUNTER MEDICATION Take 1 capsule by mouth 3 (three) times daily.   pantoprazole 40 MG tablet Commonly known as:  PROTONIX Take by mouth.   phenylephrine 10 MG Tabs tablet Commonly known as:  SUDAFED PE Take 10 mg by mouth every 4 (four) hours as needed.   PHYTOMULTI Tabs Take by mouth daily.   predniSONE 20 MG tablet Commonly known as:  DELTASONE 2 po at sametime daily for 5 days   PROBIOTIC PO Take by mouth daily.   traMADol 50 MG tablet Commonly known as:  ULTRAM Take 1 tablet (50 mg total) by mouth every 6 (six) hours as needed.   umeclidinium bromide 62.5 MCG/INH Aepb Commonly known as:  INCRUSE ELLIPTA Inhale 1 puff into the lungs daily.   UNABLE TO FIND Take 1 capsule by mouth daily. Dv3- vitamin D 3 plus immune support       Past Medical History:  Diagnosis Date  . Acid reflux   . Allergy   . Arthritis    ARTHRITIS IN NECK BY DR. Alroy Dust [facility]  . Asthma   . Interstitial cystitis   . Osteoporosis   . PONV (postoperative nausea and vomiting)   . Tinnitus   . Trigeminal neuralgia    Atypical trigeminal neuralgia    Past Surgical History:  Procedure Laterality Date  . ABDOMINAL HYSTERECTOMY  2000  . CYSTOSCOPY WITH HYDRODISTENSION AND BIOPSY N/A 06/11/2012   Procedure: CYSTOSCOPY/BIOPSY/HYDRODISTENSION with Instillation of Pyridium and Marcaine and Kenalog;  Surgeon: Ailene Rud, MD;  Location: [facility];  Service: Urology;  Laterality: N/A;  1 hour requested for this case  BLADDER BIOPSY  . ETHMOIDECTOMY  2012  . SEPTOPLASTY  1980's  . vocal cord surgery   1990's   polyp removal    Review of systems negative except as noted in HPI / PMHx or noted below:  Review of Systems  Constitutional: Negative.   HENT: Negative.   Eyes: Negative.   Respiratory: Negative.   Cardiovascular: Negative.   Gastrointestinal: Negative.   Genitourinary: Negative.   Musculoskeletal: Negative.   Skin:  Negative.   Neurological: Negative.   Endo/Heme/Allergies: Negative.   Psychiatric/Behavioral: Negative.      Objective:   Vitals:   07/05/16 1054  BP: 108/66  Pulse: 88  Resp: 18          Physical Exam  Constitutional: She is well-developed, well-nourished, and in no distress.  HENT:  Head: Normocephalic.  Right Ear: Tympanic membrane, external ear and ear canal normal.  Left Ear: Tympanic membrane, external ear and ear canal normal.  Nose: Nose normal. No mucosal edema or rhinorrhea.  Mouth/Throat: Uvula is midline, oropharynx is clear and moist and mucous membranes are normal. No oropharyngeal exudate.  Eyes: Conjunctivae are normal.  Neck: Trachea normal. No tracheal tenderness  present. No tracheal deviation present. No thyromegaly present.  Cardiovascular: Normal rate, regular rhythm, S1 normal, S2 normal and normal heart sounds.   No murmur heard. Pulmonary/Chest: Breath sounds normal. No stridor. No respiratory distress. She has no wheezes. She has no rales.  Musculoskeletal: She exhibits no edema.  Lymphadenopathy:       Head (right side): No tonsillar adenopathy present.       Head (left side): No tonsillar adenopathy present.    She has no cervical adenopathy.  Neurological: She is alert. Gait normal.  Skin: No rash noted. She is not diaphoretic. No erythema. Nails show no clubbing.  Psychiatric: Mood and affect normal.    Diagnostics:    Spirometry was performed and demonstrated an FEV1 of 2.63 at 104 % of predicted.   Assessment and Plan:   1. Asthma, moderate persistent, well-controlled   2. Other allergic rhinitis   3. LPRD (laryngopharyngeal reflux disease)   4. Chronic daily headache   5. Trigeminal neuralgia   6. Bilateral temporomandibular joint pain   7. Acute pansinusitis, recurrence not specified     1. Treat reflux:   A. Continue off all caffeine including Excedrin  B. Protonix 40 mg once a day  C. ranitidine 150-300 mg in the  evening  2. Treat inflammation:   A. Flonase one-2 sprays each nostril one time per day  B. Arnuity 200 one inhalation 1 time per day (samples)  C. Incruse one inhalation one time per day (samples)  3. If needed:   A. nasal saline  B. ProAir HFA 2 puffs every 4-6 hours  C. OTC antihistamine, Azelastine  D. OTC decongestant tablet, OTC Mucinex DM  4. For this most recent event:   A. Nasal saline multiple times a day  B. Prednisone 10mg  - one tablet one time a day for 4 days only  C. Antibiotics?  5. Return to clinic in Summer 2018 or earlier if problem  I'll assume that Jaice has a viral respiratory tract infection and I will treat her with nasal saline and a very low dose of prednisone for a few days and hold off on any antibiotics at this point in time. She'll keep in contact with me noting her response to this approach. For her other respiratory tract disease she will continue to use a nasal steroid and a combination of an inhaled steroid and a antimuscarinic agent as noted above and she will continue to treat her reflux aggressively. She has a collection of medications that she can use as needed to help with her issues. She will follow-up with her other physicians regarding her trigeminal neuralgia and her TMJ. I'll see her back in this clinic in the summer of 2018 or earlier if there is a problem.  Allena Katz, MD [facility] [place]

## 2016-07-06 ENCOUNTER — Telehealth: Payer: Self-pay | Admitting: Allergy and Immunology

## 2016-07-06 NOTE — Telephone Encounter (Signed)
Pt advised. She will call back on Friday with an update of her symptoms.

## 2016-07-06 NOTE — Telephone Encounter (Signed)
Pt called and thought you were sending in predinose in but no rx so I told her to look in bag and she found it but said she is no better and needs the antibiotic

## 2016-07-06 NOTE — Telephone Encounter (Signed)
Please informed patient it would be best if she can resolve this issue without the use of antibiotics. She needs to give it a few days to see what happens. I would recommend that she wait until at least Friday to see how she does with the plan we discussed yesterday.

## 2016-07-07 ENCOUNTER — Telehealth: Payer: Self-pay | Admitting: Internal Medicine

## 2016-07-07 ENCOUNTER — Ambulatory Visit: Payer: Medicare HMO | Admitting: Physical Therapy

## 2016-07-07 MED ORDER — AMOXICILLIN 500 MG PO CAPS: 500.0000 mg | ORAL_CAPSULE | Freq: Three times a day (TID) | ORAL | 0 refills | Status: DC

## 2016-07-07 NOTE — Telephone Encounter (Signed)
Please send script for amoxicillin 500 mg tid for 7 days.

## 2016-07-07 NOTE — Telephone Encounter (Signed)
Pt c/o sinus congestion, pnd, prod cough with green mucus X4 days.    Pt has been doing saline sinus rinses and taking allegra, nasonex, mucinex dm, astelin, and phenylephrine to help with symptoms.  Requesting further recs- requesting an abx. Please note several abx allergies- pt notes that she tolerates amoxicillin well if she takes it with food.    [facility] [place]    Sending to DOD as CY is unavailable today.   VS please advise on recs.  Thanks!

## 2016-07-07 NOTE — Telephone Encounter (Signed)
Pt aware of recs.  rx sent to preferred pharmacy.  Nothing further needed.  

## 2016-07-09 ENCOUNTER — Telehealth: Payer: Self-pay | Admitting: Family Medicine

## 2016-07-09 MED ORDER — DOXYCYCLINE HYCLATE 100 MG PO TABS: 100.0000 mg | ORAL_TABLET | Freq: Two times a day (BID) | ORAL | 0 refills | Status: DC

## 2016-07-09 NOTE — Telephone Encounter (Signed)
What symptoms do you have? Uri sinus infection  How long have you been sick? Since Tuesday  Have you been seen for this problem? No  If your provider decides to give you a prescription, which pharmacy would you like for it to be sent to? [facility], pt wants prednisone rx.   Patient informed that this information will be sent to the clinical staff for review and that they should receive a follow up call.

## 2016-07-09 NOTE — Telephone Encounter (Signed)
Patient wanted prednisone to add to the antibiotic she is currently taking.  Advised patient to finish current medication before trying other  medicines,( too many antibiotics will wear down her immunities).   Stay in away from pollen and increase her fluid intake.  Would need to be seen if after finishing current antibiotic ,she is still bothered with sinus.

## 2016-07-09 NOTE — Telephone Encounter (Signed)
Doxycycline Prescription sent to pharmacy   

## 2016-07-12 ENCOUNTER — Ambulatory Visit: Payer: Medicare HMO

## 2016-07-22 ENCOUNTER — Telehealth: Payer: Self-pay | Admitting: Family Medicine

## 2016-07-29 ENCOUNTER — Other Ambulatory Visit: Payer: Self-pay

## 2016-07-29 MED ORDER — FLUTICASONE FUROATE 200 MCG/ACT IN AEPB: 1.0000 | INHALATION_SPRAY | Freq: Every day | RESPIRATORY_TRACT | 2 refills | Status: DC

## 2016-08-02 ENCOUNTER — Ambulatory Visit: Payer: Medicare HMO | Admitting: Pharmacist

## 2016-08-16 ENCOUNTER — Other Ambulatory Visit: Payer: Self-pay | Admitting: *Deleted

## 2016-08-16 ENCOUNTER — Telehealth: Payer: Self-pay | Admitting: Allergy and Immunology

## 2016-08-16 MED ORDER — PREDNISONE 10 MG PO TABS: ORAL_TABLET | ORAL | 0 refills | Status: DC

## 2016-08-16 NOTE — Telephone Encounter (Signed)
Patient informed, she will call us in one month to get a lab order form.

## 2016-08-16 NOTE — Telephone Encounter (Signed)
Please inform Kristy Garza that we cannot rely on the use of prednisone over and over again to treat her issues. She can use prednisone one more time at a dose of 10 mg once a day for a total of 5 days only at this point. However, I would suggest that she not request additional steroid administration in the future. In fact, last check her adrenal function by screening with an a.m. cortisol one month after she finishes this most recent course of steroids.

## 2016-08-16 NOTE — Telephone Encounter (Signed)
Pt called and wants to talk with a nurse about her problems she has coughing a lot allegra not working nasel congestion [facility]. Debe Coder [phone].

## 2016-08-16 NOTE — Telephone Encounter (Signed)
Spoke to patient states she has congestion and cough. Allegra and nasonex not helping. States she would like prednisone since that helped with symptoms last month. Dr Neldon Mc please advise

## 2016-08-19 DIAGNOSIS — M9903 Segmental and somatic dysfunction of lumbar region: Secondary | ICD-10-CM | POA: Diagnosis not present

## 2016-08-19 DIAGNOSIS — M5136 Other intervertebral disc degeneration, lumbar region: Secondary | ICD-10-CM | POA: Diagnosis not present

## 2016-08-19 DIAGNOSIS — M9901 Segmental and somatic dysfunction of cervical region: Secondary | ICD-10-CM | POA: Diagnosis not present

## 2016-08-19 DIAGNOSIS — M5032 Other cervical disc degeneration, mid-cervical region, unspecified level: Secondary | ICD-10-CM | POA: Diagnosis not present

## 2016-08-19 DIAGNOSIS — M5137 Other intervertebral disc degeneration, lumbosacral region: Secondary | ICD-10-CM | POA: Diagnosis not present

## 2016-08-19 DIAGNOSIS — M9904 Segmental and somatic dysfunction of sacral region: Secondary | ICD-10-CM | POA: Diagnosis not present

## 2016-08-19 DIAGNOSIS — M9902 Segmental and somatic dysfunction of thoracic region: Secondary | ICD-10-CM | POA: Diagnosis not present

## 2016-08-19 DIAGNOSIS — M5134 Other intervertebral disc degeneration, thoracic region: Secondary | ICD-10-CM | POA: Diagnosis not present

## 2016-08-23 ENCOUNTER — Telehealth: Payer: Self-pay | Admitting: Family Medicine

## 2016-08-26 ENCOUNTER — Ambulatory Visit (INDEPENDENT_AMBULATORY_CARE_PROVIDER_SITE_OTHER): Payer: Medicare HMO | Admitting: Pediatrics

## 2016-08-26 VITALS — BP 107/66 | HR 90 | Temp 98.4°F | Resp 16 | Ht 69.0 in | Wt 109.2 lb

## 2016-08-26 DIAGNOSIS — J069 Acute upper respiratory infection, unspecified: Secondary | ICD-10-CM

## 2016-08-26 DIAGNOSIS — J3089 Other allergic rhinitis: Secondary | ICD-10-CM | POA: Diagnosis not present

## 2016-08-26 DIAGNOSIS — J40 Bronchitis, not specified as acute or chronic: Secondary | ICD-10-CM | POA: Diagnosis not present

## 2016-08-26 MED ORDER — LORATADINE 10 MG PO TABS: 10.0000 mg | ORAL_TABLET | Freq: Every day | ORAL | 11 refills | Status: DC

## 2016-08-26 MED ORDER — AZITHROMYCIN 250 MG PO TABS: ORAL_TABLET | ORAL | 0 refills | Status: DC

## 2016-08-26 NOTE — Patient Instructions (Signed)
Netipot with distilled water 2-3 times a day to clear out sinuses Or Normal saline nasal spray  nasonex steroid nasal spray  Antihistamine daily such as claritin  Tylenol as needed  Lots of fluids

## 2016-08-26 NOTE — Progress Notes (Signed)
  Subjective:   Patient ID: Kristy Garza, female    DOB: 1950-10-18, 66 y.o.   MRN: [id number] CC: Cough and Chest congestion  HPI: Kristy Garza is a 66 y.o. female presenting for Cough and Chest congestion  Coughing up some green No SOB Scratchy throat In cold air at the beach recently No fevers Normal appetite Some congestion Taking nasonex, allegra, mucinex, sudafed, helping some   Relevant past medical, surgical, family and social history reviewed. Allergies and medications reviewed and updated. History  Smoking Status  . Never Smoker  Smokeless Tobacco  . Never Used   ROS: Per HPI   Objective:    BP 107/66   Pulse 90   Temp 98.4 F (36.9 C) (Oral)   Resp 16   Ht 5\' 9"  (1.753 m)   Wt 109 lb 3.2 oz (49.5 kg)   SpO2 100%   BMI 16.13 kg/m   Wt Readings from Last 3 Encounters:  08/26/16 109 lb 3.2 oz (49.5 kg)  06/21/16 108 lb 12.8 oz (49.4 kg)  05/17/16 106 lb 6.4 oz (48.3 kg)    Gen: NAD, alert, thin, cooperative with exam, NCAT EYES: EOMI, no conjunctival injection, or no icterus ENT:  TMs pearly gray b/l, OP with mild erythema LYMPH: small < 1 cm cervical LAD CV: NRRR, normal S1/S2, no murmur, distal pulses 2+ b/l Resp: CTABL, no wheezes, normal WOB Ext: No edema, warm Neuro: Alert and oriented  Assessment & Plan:  Harlo was seen today for cough and chest congestion.  Diagnoses and all orders for this visit:  Bronchitis If not improving over next few days OK to start below -     azithromycin (ZITHROMAX) 250 MG tablet; Take 2 the first day and then one each day after.  Allergic rhinitis due to other allergic trigger, unspecified seasonality -     loratadine (CLARITIN) 10 MG tablet; Take 1 tablet (10 mg total) by mouth daily.  Acute URI Discussed symptomatic care, sinus rinses  Follow up plan: As needed Assunta Found, MD [place]

## 2016-09-06 NOTE — Telephone Encounter (Signed)
Scheduled

## 2016-09-15 ENCOUNTER — Other Ambulatory Visit: Payer: Self-pay | Admitting: Family Medicine

## 2016-09-19 ENCOUNTER — Telehealth: Payer: Self-pay | Admitting: Family Medicine

## 2016-09-19 NOTE — Telephone Encounter (Signed)
Pt has not been taking cymbalta regularly - she will start being more regular with this -  She also states she is having some dysuria and possible yeast - she has appt Thursday - she is aware we will check them

## 2016-09-20 DIAGNOSIS — M5137 Other intervertebral disc degeneration, lumbosacral region: Secondary | ICD-10-CM | POA: Diagnosis not present

## 2016-09-20 DIAGNOSIS — M9901 Segmental and somatic dysfunction of cervical region: Secondary | ICD-10-CM | POA: Diagnosis not present

## 2016-09-20 DIAGNOSIS — M5134 Other intervertebral disc degeneration, thoracic region: Secondary | ICD-10-CM | POA: Diagnosis not present

## 2016-09-20 DIAGNOSIS — M9902 Segmental and somatic dysfunction of thoracic region: Secondary | ICD-10-CM | POA: Diagnosis not present

## 2016-09-20 DIAGNOSIS — M9903 Segmental and somatic dysfunction of lumbar region: Secondary | ICD-10-CM | POA: Diagnosis not present

## 2016-09-20 DIAGNOSIS — M5032 Other cervical disc degeneration, mid-cervical region, unspecified level: Secondary | ICD-10-CM | POA: Diagnosis not present

## 2016-09-20 DIAGNOSIS — M5136 Other intervertebral disc degeneration, lumbar region: Secondary | ICD-10-CM | POA: Diagnosis not present

## 2016-09-20 DIAGNOSIS — M9904 Segmental and somatic dysfunction of sacral region: Secondary | ICD-10-CM | POA: Diagnosis not present

## 2016-09-22 ENCOUNTER — Ambulatory Visit: Payer: Medicare HMO | Admitting: Family Medicine

## 2016-09-24 ENCOUNTER — Ambulatory Visit (INDEPENDENT_AMBULATORY_CARE_PROVIDER_SITE_OTHER): Payer: Medicare HMO | Admitting: Nurse Practitioner

## 2016-09-24 ENCOUNTER — Encounter: Payer: Self-pay | Admitting: Nurse Practitioner

## 2016-09-24 VITALS — BP 112/73 | HR 73 | Temp 97.0°F | Ht 69.0 in | Wt 111.0 lb

## 2016-09-24 DIAGNOSIS — S0300XD Dislocation of jaw, unspecified side, subsequent encounter: Secondary | ICD-10-CM | POA: Diagnosis not present

## 2016-09-24 DIAGNOSIS — R300 Dysuria: Secondary | ICD-10-CM | POA: Diagnosis not present

## 2016-09-24 MED ORDER — DIAZEPAM 5 MG PO TABS: 5.0000 mg | ORAL_TABLET | Freq: Two times a day (BID) | ORAL | 0 refills | Status: DC | PRN

## 2016-09-24 NOTE — Progress Notes (Signed)
   Subjective:    Patient ID: Kristy Garza, female    DOB: Mar 27, 1951, 66 y.o.   MRN: [id number]  HPI Patient comes in today with 2 complaints: - dysuria and frequency for 3 days- has some perineal itching- no fever, no abdominal pain. - Has long history of TMJ- pain has worsened- worse at night- takes baclofen which does not help anymore. Rates pain 7/10 currently. Has seen oral surgeon and ENT oin past and they only offered her injections that she thought were to expensive.    Review of Systems  Constitutional: Negative.   HENT: Negative.        Bil jaw pain  Respiratory: Negative.   Genitourinary: Positive for dysuria, frequency and urgency.  Neurological: Negative.   Psychiatric/Behavioral: Negative.   All other systems reviewed and are negative.      Objective:   Physical Exam  Constitutional: She appears well-developed and well-nourished.  HENT:  Right Ear: External ear normal.  Left Ear: External ear normal.  Nose: Nose normal.  Mouth/Throat: Oropharynx is clear and moist.  Clicking of bil jaws when opening and closing mouth  Eyes: Pupils are equal, round, and reactive to light.  Neck: Normal range of motion. Neck supple.  Cardiovascular: Normal rate and regular rhythm.   Pulmonary/Chest: Effort normal and breath sounds normal.  Abdominal: Soft. Bowel sounds are normal. There is tenderness (mild supra pubic pain on palpation).  Lymphadenopathy:    She has no cervical adenopathy.  Neurological: She is alert.  Skin: Skin is warm.  Psychiatric: She has a normal mood and affect. Her behavior is normal. Judgment and thought content normal.   BP 112/73   Pulse 73   Temp 97 F (36.1 C) (Oral)   Ht 5\' 9"  (1.753 m)   Wt 111 lb (50.3 kg)   BMI 16.39 kg/m   Urine clear     Assessment & Plan:  1. Dysuria Force fluids - Urinalysis, Complete  2. Dislocation of temporomandibular joint, subsequent encounter Sedation precautions Follow up with dr. Wendi Snipes for  refill if helps - diazepam (VALIUM) 5 MG tablet; Take 1 tablet (5 mg total) by mouth every 12 (twelve) hours as needed for anxiety.  Dispense: 30 tablet; Refill: 0   Mary-Margaret Hassell Done, FNP

## 2016-09-24 NOTE — Patient Instructions (Signed)
Temporomandibular Joint Syndrome Temporomandibular joint (TMJ) syndrome is a condition that affects the joints between your jaw and your skull. The TMJs are located near your ears and allow your jaw to open and close. These joints and the nearby muscles are involved in all movements of the jaw. People with TMJ syndrome have pain in the area of these joints and muscles. Chewing, biting, or other movements of the jaw can be difficult or painful. TMJ syndrome can be caused by various things. In many cases, the condition is mild and goes away within a few weeks. For some people, the condition can become a long-term problem. What are the causes? Possible causes of TMJ syndrome include:  Grinding your teeth or clenching your jaw. Some people do this when they are under stress.  Arthritis.  Injury to the jaw.  Head or neck injury.  Teeth or dentures that are not aligned well.  In some cases, the cause of TMJ syndrome may not be known. What are the signs or symptoms? The most common symptom is an aching pain on the side of the head in the area of the TMJ. Other symptoms may include:  Pain when moving your jaw, such as when chewing or biting.  Being unable to open your jaw all the way.  Making a clicking sound when you open your mouth.  Headache.  Earache.  Neck or shoulder pain.  How is this diagnosed? Diagnosis can usually be made based on your symptoms, your medical history, and a physical exam. Your health care provider may check the range of motion of your jaw. Imaging tests, such as X-rays or an MRI, are sometimes done. You may need to see your dentist to determine if your teeth and jaw are lined up correctly. How is this treated? TMJ syndrome often goes away on its own. If treatment is needed, the options may include:  Eating soft foods and applying ice or heat.  Medicines to relieve pain or inflammation.  Medicines to relax the muscles.  A splint, bite plate, or mouthpiece  to prevent teeth grinding or jaw clenching.  Relaxation techniques or counseling to help reduce stress.  Transcutaneous electrical nerve stimulation (TENS). This helps to relieve pain by applying an electrical current through the skin.  Acupuncture. This is sometimes helpful to relieve pain.  Jaw surgery. This is rarely needed.  Follow these instructions at home:  Take medicines only as directed by your health care provider.  Eat a soft diet if you are having trouble chewing.  Apply ice to the painful area. ? Put ice in a plastic bag. ? Place a towel between your skin and the bag. ? Leave the ice on for 20 minutes, 2-3 times a day.  Apply a warm compress to the painful area as directed.  Massage your jaw area and perform any jaw stretching exercises as recommended by your health care provider.  If you were given a mouthpiece or bite plate, wear it as directed.  Avoid foods that require a lot of chewing. Do not chew gum.  Keep all follow-up visits as directed by your health care provider. This is important. Contact a health care provider if:  You are having trouble eating.  You have new or worsening symptoms. Get help right away if:  Your jaw locks open or closed. This information is not intended to replace advice given to you by your health care provider. Make sure you discuss any questions you have with your health care provider. Document   Released: 12/07/2000 Document Revised: 11/12/2015 Document Reviewed: 10/17/2013 Elsevier Interactive Patient Education  2018 [employer] Inc[employer]  

## 2016-09-26 DIAGNOSIS — R300 Dysuria: Secondary | ICD-10-CM | POA: Diagnosis not present

## 2016-09-26 LAB — URINALYSIS
Bilirubin, UA: NEGATIVE
GLUCOSE, UA: NEGATIVE
Ketones, UA: NEGATIVE
Nitrite, UA: NEGATIVE
PH UA: 7 (ref 5.0–7.5)
PROTEIN UA: NEGATIVE
RBC, UA: NEGATIVE
Specific Gravity, UA: 1.015 (ref 1.005–1.030)
Urobilinogen, Ur: 0.2 mg/dL (ref 0.2–1.0)

## 2016-09-26 LAB — URINALYSIS, COMPLETE
Bilirubin, UA: NEGATIVE
Glucose, UA: NEGATIVE
Ketones, UA: NEGATIVE
Nitrite, UA: NEGATIVE
Protein, UA: NEGATIVE
RBC, UA: NEGATIVE
Specific Gravity, UA: 1.015 (ref 1.005–1.030)
Urobilinogen, Ur: 0.2 mg/dL (ref 0.2–1.0)
pH, UA: 7 (ref 5.0–7.5)

## 2016-09-26 NOTE — Addendum Note (Signed)
Addended by: Rolena Infante on: 09/26/2016 04:10 PM   Modules accepted: Orders

## 2016-10-04 ENCOUNTER — Ambulatory Visit: Payer: Medicare HMO | Admitting: Family Medicine

## 2016-10-13 ENCOUNTER — Ambulatory Visit: Payer: Medicare HMO | Admitting: Family Medicine

## 2016-10-20 ENCOUNTER — Ambulatory Visit: Payer: Medicare HMO | Admitting: Family Medicine

## 2016-10-20 ENCOUNTER — Ambulatory Visit (INDEPENDENT_AMBULATORY_CARE_PROVIDER_SITE_OTHER): Payer: Medicare HMO | Admitting: Family Medicine

## 2016-10-20 ENCOUNTER — Encounter: Payer: Self-pay | Admitting: Family Medicine

## 2016-10-20 VITALS — BP 95/65 | HR 79 | Temp 97.2°F | Ht 69.0 in | Wt 110.4 lb

## 2016-10-20 DIAGNOSIS — F411 Generalized anxiety disorder: Secondary | ICD-10-CM

## 2016-10-20 DIAGNOSIS — M26623 Arthralgia of bilateral temporomandibular joint: Secondary | ICD-10-CM | POA: Diagnosis not present

## 2016-10-20 DIAGNOSIS — R21 Rash and other nonspecific skin eruption: Secondary | ICD-10-CM | POA: Diagnosis not present

## 2016-10-20 DIAGNOSIS — R69 Illness, unspecified: Secondary | ICD-10-CM | POA: Diagnosis not present

## 2016-10-20 MED ORDER — DULOXETINE HCL 60 MG PO CPEP: 60.0000 mg | ORAL_CAPSULE | Freq: Every day | ORAL | 3 refills | Status: DC

## 2016-10-20 MED ORDER — BACLOFEN 10 MG PO TABS: 10.0000 mg | ORAL_TABLET | Freq: Three times a day (TID) | ORAL | 2 refills | Status: DC

## 2016-10-20 MED ORDER — TRIAMCINOLONE ACETONIDE 0.1 % EX CREA: 1.0000 "application " | TOPICAL_CREAM | Freq: Two times a day (BID) | CUTANEOUS | 0 refills | Status: DC

## 2016-10-20 NOTE — Progress Notes (Signed)
   HPI  Patient presents today here for follow-up chronic medical conditions.  TMJ Patient continues to have bilateral severe TMJ pain. She states that she has pain increased with chewing. She mostly has consistent persistent pain. Is not been helped by many medications. She has had sensitivities and intolerances to many medications. Baclofen has not been helping, however she has only taken it as needed.  Anxiety Valium caused too much sedation Patient still taking Cymbalta and has noticed an improvement.  Rash Itchy red rash on the inner right thigh, started about 1 week ago and has began to improve. She does have a few other spots that have cropped up since that time. Mild cough. No exposure to new animals, no travel,  PMH: Smoking status noted ROS: Per HPI  Objective: BP 95/65   Pulse 79   Temp (!) 97.2 F (36.2 C) (Oral)   Ht 5\' 9"  (1.753 m)   Wt 110 lb 6.4 oz (50.1 kg)   BMI 16.30 kg/m  Gen: NAD, alert, cooperative with exam HEENT: NCAT, EOMI, PERRL CV: RRR, good S1/S2, no murmur Resp: CTABL, no wheezes, non-labored Ext: No edema, warm Neuro: Alert and oriented, No gross deficits Skin:  Distal inner right thigh with approximately 10-15 erythematous heme crusted papules scattered, single papule on left shoulder similar appearance.  Assessment and plan:  # Rash Most likely arthropod bite, could be viral exanthem, however not quite generalized enough for that. Kenalog cream given for itching  # Bilateral temporomandibular joint pain Recommended increasing Cymbalta to 60 mg, schedule baclofen 5 mg 3 times daily and work up to 10 mg 3 times daily.   # Generalized anxiety disorder Stable, improved, however not quite in control. Discontinue Valium, could try 2 mg pill if needed. Increase Cymbalta to 60 mg once daily    Meds ordered this encounter  Medications  . baclofen (LIORESAL) 10 MG tablet    Sig: Take 1 tablet (10 mg total) by mouth 3 (three) times  daily. start with 1/2 pill TID, increase by 5 mg per dose until 10 mg TID    Dispense:  90 each    Refill:  2  . DULoxetine (CYMBALTA) 60 MG capsule    Sig: Take 1 capsule (60 mg total) by mouth daily.    Dispense:  30 capsule    Refill:  3  . triamcinolone cream (KENALOG) 0.1 %    Sig: Apply 1 application topically 2 (two) times daily.    Dispense:  30 g    Refill:  [place], MD [facility] 10/20/2016, 4:49 PM

## 2016-10-20 NOTE — Patient Instructions (Addendum)
Great to see you!  Increase cymbalta to 60 mg  Start baclofen 3 times a day, start with 5 mg 3 times a day and work up to 10 mg 3 times a day, if that is not effective we can work up to 20 mg 3 times a day.   Use triamcinolone cream for itching.

## 2016-10-28 DIAGNOSIS — R6884 Jaw pain: Secondary | ICD-10-CM | POA: Diagnosis not present

## 2016-10-28 DIAGNOSIS — K0889 Other specified disorders of teeth and supporting structures: Secondary | ICD-10-CM | POA: Diagnosis not present

## 2016-10-28 DIAGNOSIS — M542 Cervicalgia: Secondary | ICD-10-CM | POA: Diagnosis not present

## 2016-10-28 DIAGNOSIS — G8929 Other chronic pain: Secondary | ICD-10-CM | POA: Diagnosis not present

## 2016-10-28 DIAGNOSIS — Z79899 Other long term (current) drug therapy: Secondary | ICD-10-CM | POA: Diagnosis not present

## 2016-11-08 ENCOUNTER — Other Ambulatory Visit: Payer: Self-pay | Admitting: Family Medicine

## 2016-11-09 NOTE — Telephone Encounter (Signed)
last seen 10/20/16  Dr Wendi Snipes

## 2016-11-14 ENCOUNTER — Telehealth: Payer: Self-pay | Admitting: Family Medicine

## 2016-11-14 NOTE — Telephone Encounter (Signed)
Patient is still having problems having BMs. Patient states last BM was Saturday night abnormal very small watery stools. Patient has taken Miralax and milk of magnesia Patient uses [facility] in [place].

## 2016-11-14 NOTE — Telephone Encounter (Signed)
These medications do work gradually, you have to do them daily until you have a bowel movement. You cannot let herself get behind like this and as soon as you don't have a bowel movement you need to start them on that day.

## 2016-11-14 NOTE — Telephone Encounter (Signed)
Pt aware.

## 2016-11-16 ENCOUNTER — Encounter: Payer: Self-pay | Admitting: Family Medicine

## 2016-11-16 ENCOUNTER — Ambulatory Visit (INDEPENDENT_AMBULATORY_CARE_PROVIDER_SITE_OTHER): Payer: Medicare HMO

## 2016-11-16 ENCOUNTER — Ambulatory Visit (INDEPENDENT_AMBULATORY_CARE_PROVIDER_SITE_OTHER): Payer: Medicare HMO | Admitting: Family Medicine

## 2016-11-16 VITALS — BP 91/61 | HR 74 | Ht 69.0 in | Wt 109.0 lb

## 2016-11-16 DIAGNOSIS — R05 Cough: Secondary | ICD-10-CM | POA: Diagnosis not present

## 2016-11-16 DIAGNOSIS — K581 Irritable bowel syndrome with constipation: Secondary | ICD-10-CM | POA: Diagnosis not present

## 2016-11-16 DIAGNOSIS — K5900 Constipation, unspecified: Secondary | ICD-10-CM | POA: Diagnosis not present

## 2016-11-16 DIAGNOSIS — J301 Allergic rhinitis due to pollen: Secondary | ICD-10-CM

## 2016-11-16 DIAGNOSIS — R059 Cough, unspecified: Secondary | ICD-10-CM

## 2016-11-16 DIAGNOSIS — K5904 Chronic idiopathic constipation: Secondary | ICD-10-CM

## 2016-11-16 MED ORDER — BETAMETHASONE SOD PHOS & ACET 6 (3-3) MG/ML IJ SUSP
6.0000 mg | Freq: Once | INTRAMUSCULAR | Status: AC
Start: 2016-11-16 — End: 2016-11-16
  Administered 2016-11-16: 6 mg via INTRAMUSCULAR

## 2016-11-16 MED ORDER — LINACLOTIDE 290 MCG PO CAPS: 290.0000 ug | ORAL_CAPSULE | Freq: Every day | ORAL | 5 refills | Status: DC

## 2016-11-16 NOTE — Patient Instructions (Signed)
It was a pleasure to see you today. Thanks for coming in!  You were given a Celestone shot (which is a form of cortisone) to help with your sinus and allergy symptoms.  Our main discussion regarded the constipation that you were experiencing. Your x-ray confirmed this. I am concerned that you have extra gas potentially because of the enema you took. Per our discussion no more enemas unless specifically directed to do so by Dr. Ferdinand Lango.  To treat the constipation you should take Linzess 290 g daily regardless of your symptoms. When constipated U should add 2-4 ounces of hard cherry juice or 4-6 ounces of prune juice every 4 hours as needed. Additionally U should use a capful of MiraLAX in water every 4 hours until you achieve a good result. If you go more than a day without a result U should add to the MiraLAX and juice regimen a 10 ounce bottle of magnesium citrate. Take that daily until you achieve a good result.  As you suggested, watery is essential. Most experts recommend 64 ounces a day. That is the equivalent of 8 eight- ounce glasses daily

## 2016-11-16 NOTE — Progress Notes (Signed)
Chief Complaint  Patient presents with  . Constipation    pt here today c/o constipation which she has been talking with her GI nurse about as well. She also is c/o cough and tessalon perles not working well.    HPI  Patient presents today for Severe constipation. She has been seeing Dr. Darlis Loan of GI. They suggested she come here because she couldn't get [facility] due to her discomfort. She describes the discomfort as a tight bloated feeling throughout the abdomen measuring 8/10 for severity. Over the last 3 days as the constipation and increased the symptoms she has tried apple juice which she later tells me she has an allergy to. She also tried MiraLAX and gave herself an enema. None of these relieved her discomfort. Of note is she has a history of IBS with constipation predominant. Patient has allergic rhinitis symptoms including sneezing frequently sniffling, clear rhinorrhea, watery and itchy eyes. There has been no fever no chills no sweats. No earaches. There is some scratchy throat but no sore throat or difficulty swallowing. There is some nasal congestion.   PMH: Smoking status noted GEX:BMWUXL of Systems  Constitutional: Negative for activity change, appetite change and fever.  HENT: Negative for congestion, rhinorrhea and sore throat.   Eyes: Negative for visual disturbance.  Respiratory: Negative for cough and shortness of breath.   Cardiovascular: Negative for chest pain and palpitations.  Gastrointestinal: Positive for abdominal pain and constipation. Negative for anal bleeding, blood in stool, diarrhea, nausea and vomiting.  Genitourinary: Negative for dysuria.  Musculoskeletal: Negative for arthralgias and myalgias.  Allergic/Immunologic: Positive for environmental allergies and food allergies. Negative for immunocompromised state.  Neurological: Negative for dizziness.  Psychiatric/Behavioral: The patient is nervous/anxious.     Current Outpatient Prescriptions on File  Prior to Visit  Medication Sig Dispense Refill  . acetaminophen (TYLENOL) 500 MG tablet Take by mouth.    [name] albuterol (PROVENTIL HFA;VENTOLIN HFA) 108 (90 Base) MCG/ACT inhaler Inhale two puffs every four to six hours as needed for cough or wheeze. 1 Inhaler 1  . azelastine (ASTELIN) 0.1 % nasal spray Place 2 sprays into both nostrils 2 (two) times daily. Use in each nostril as directed 30 mL 5  . b complex vitamins capsule Take 1 capsule by mouth daily.    . baclofen (LIORESAL) 10 MG tablet Take 1 tablet (10 mg total) by mouth 3 (three) times daily. start with 1/2 pill TID, increase by 5 mg per dose until 10 mg TID 90 each 2  . benzonatate (TESSALON) 200 MG capsule TAKE 1 CAPSULE (200 MG TOTAL) BY MOUTH 2 (TWO) TIMES DAILY AS NEEDED FOR COUGH. 40 capsule 0  . DULoxetine (CYMBALTA) 60 MG capsule Take 1 capsule (60 mg total) by mouth daily. 30 capsule 3  . Fluticasone Furoate (ARNUITY ELLIPTA) 200 MCG/ACT AEPB Inhale 1 puff into the lungs daily. 30 each 2  . guaiFENesin (MUCINEX) 600 MG 12 hr tablet Take 600 mg by mouth 2 (two) times daily as needed.    . lidocaine (XYLOCAINE) 2 % jelly Apply topically as needed. 30 mL 10  . mometasone (NASONEX) 50 MCG/ACT nasal spray Place 2 sprays into the nose daily. 17 g 12  . pantoprazole (PROTONIX) 40 MG tablet Take 40 mg by mouth daily.     . phenylephrine (SUDAFED PE) 10 MG TABS tablet Take 10 mg by mouth every 4 (four) hours as needed.    . Polyethylene Glycol 3350 (MIRALAX PO) Take by mouth as needed.    [name]  Probiotic Product (PROBIOTIC PO) Take by mouth daily.    . Simethicone (GAS-X PO) Take by mouth.    . traMADol (ULTRAM) 50 MG tablet Take 1 tablet (50 mg total) by mouth every 6 (six) hours as needed. 30 tablet 1  . UNABLE TO FIND Take 1 capsule by mouth daily. Dv3- vitamin D 3 plus immune support    . loratadine (CLARITIN) 10 MG tablet Take 1 tablet (10 mg total) by mouth daily. (Patient not taking: Reported on 11/16/2016) 30 tablet 11  . ranitidine  (ZANTAC) 150 MG tablet Take 150 mg by mouth at bedtime.     No current facility-administered medications on file prior to visit.    Objective: BP 91/61   Pulse 74   Ht 5\' 9"  (1.753 m)   Wt 109 lb (49.4 kg)   BMI 16.10 kg/m  Gen: NAD, alert, cooperative with exam HEENT: NCAT, EOMI, PERRL CV: RRR, good S1/S2, no murmur Resp: CTABL, no wheezes, non-labored Abd: Soft and moderately tender throughout, scaphoid but percussion is tympanitic. There are no masses. Ext: No edema, warm Neuro: Alert and oriented, No gross deficits  Assessment and plan:  1. Chronic idiopathic constipation   2. Irritable bowel syndrome with constipation   3. Seasonal allergic rhinitis due to pollen   4. Cough     Meds ordered this encounter  Medications  . linaclotide (LINZESS) 290 MCG CAPS capsule    Sig: Take 1 capsule (290 mcg total) by mouth daily. To regulate bowel movements    Dispense:  30 capsule    Refill:  5  . betamethasone acetate-betamethasone sodium phosphate (CELESTONE) injection 6 mg    Orders Placed This Encounter  Procedures  . DG Abd 1 View    Standing Status:   Future    Number of Occurrences:   1    Standing Expiration Date:   01/16/2018    Order Specific Question:   Reason for Exam (SYMPTOM  OR DIAGNOSIS REQUIRED)    Answer:   severe constipation    Order Specific Question:   Preferred imaging location?    Answer:   Internal   Allergies as of 11/16/2016      Reactions   Cefuroxime Axetil Other (See Comments)   Extreme gas   Augmentin [amoxicillin-pot Clavulanate] Diarrhea   Avelox [moxifloxacin Hcl In Nacl] Other (See Comments)   Tremors   Biaxin [clarithromycin]    Unsure of reaction   Cedax [ceftibuten] Other (See Comments)   tremors   Ciprofloxacin Other (See Comments)   Blurred vision   Clindamycin/lincomycin    tachycardia   Gabapentin    Constipation   Levofloxacin Other (See Comments)   Feels dehydrated   Lyrica [pregabalin]    Drowsiness, dry mouth    Singulair [montelukast Sodium] Other (See Comments)   Numbness and tingling in hand.   Sulfonamide Derivatives Other (See Comments)   Gi upset      Medication List       Accurate as of 11/16/16 11:44 AM. Always use your most recent med list.          acetaminophen 500 MG tablet Commonly known as:  TYLENOL Take by mouth.   albuterol 108 (90 Base) MCG/ACT inhaler Commonly known as:  PROVENTIL HFA;VENTOLIN HFA Inhale two puffs every four to six hours as needed for cough or wheeze.   azelastine 0.1 % nasal spray Commonly known as:  ASTELIN Place 2 sprays into both nostrils 2 (two) times daily. Use in each nostril  as directed   b complex vitamins capsule Take 1 capsule by mouth daily.   baclofen 10 MG tablet Commonly known as:  LIORESAL Take 1 tablet (10 mg total) by mouth 3 (three) times daily. start with 1/2 pill TID, increase by 5 mg per dose until 10 mg TID   benzonatate 200 MG capsule Commonly known as:  TESSALON TAKE 1 CAPSULE (200 MG TOTAL) BY MOUTH 2 (TWO) TIMES DAILY AS NEEDED FOR COUGH.   DULoxetine 60 MG capsule Commonly known as:  CYMBALTA Take 1 capsule (60 mg total) by mouth daily.   Fluticasone Furoate 200 MCG/ACT Aepb Commonly known as:  ARNUITY ELLIPTA Inhale 1 puff into the lungs daily.   GAS-X PO Take by mouth.   guaiFENesin 600 MG 12 hr tablet Commonly known as:  MUCINEX Take 600 mg by mouth 2 (two) times daily as needed.   lidocaine 2 % jelly Commonly known as:  XYLOCAINE Apply topically as needed.   linaclotide 290 MCG Caps capsule Commonly known as:  LINZESS Take 1 capsule (290 mcg total) by mouth daily. To regulate bowel movements   loratadine 10 MG tablet Commonly known as:  CLARITIN Take 1 tablet (10 mg total) by mouth daily.   MIRALAX PO Take by mouth as needed.   mometasone 50 MCG/ACT nasal spray Commonly known as:  NASONEX Place 2 sprays into the nose daily.   pantoprazole 40 MG tablet Commonly known as:  PROTONIX Take 40  mg by mouth daily.   phenylephrine 10 MG Tabs tablet Commonly known as:  SUDAFED PE Take 10 mg by mouth every 4 (four) hours as needed.   PROBIOTIC PO Take by mouth daily.   ranitidine 150 MG tablet Commonly known as:  ZANTAC Take 150 mg by mouth at bedtime.   traMADol 50 MG tablet Commonly known as:  ULTRAM Take 1 tablet (50 mg total) by mouth every 6 (six) hours as needed.   UNABLE TO FIND Take 1 capsule by mouth daily. Dv3- vitamin D 3 plus immune support            Discharge Care Instructions        Start     Ordered   11/16/16 1015  BETAMETHASONE SOD PHOS & ACET 6 (3-3) MG/ML IJ SUSP   Once     11/16/16 1008   11/16/16 0000  DG Abd 1 View    Question Answer Comment  Reason for Exam (SYMPTOM  OR DIAGNOSIS REQUIRED) severe constipation   Preferred imaging location? Internal      11/16/16 0924   11/16/16 0000  linaclotide (LINZESS) 290 MCG CAPS capsule  Daily     11/16/16 1001      Follow up as needed.  [name], MD

## 2016-11-17 ENCOUNTER — Telehealth: Payer: Self-pay | Admitting: Family Medicine

## 2016-11-18 NOTE — Telephone Encounter (Signed)
Pt notified of recommendation Verbalizes understanding 

## 2016-11-18 NOTE — Telephone Encounter (Signed)
Please contact the patient Kristy Garza can cause constipation. Please try to wean off and see if that plus the measaures we discussed will help.

## 2016-11-20 DIAGNOSIS — K219 Gastro-esophageal reflux disease without esophagitis: Secondary | ICD-10-CM | POA: Diagnosis not present

## 2016-11-20 DIAGNOSIS — K5909 Other constipation: Secondary | ICD-10-CM | POA: Diagnosis not present

## 2016-11-20 DIAGNOSIS — Z888 Allergy status to other drugs, medicaments and biological substances status: Secondary | ICD-10-CM | POA: Diagnosis not present

## 2016-11-20 DIAGNOSIS — Z791 Long term (current) use of non-steroidal anti-inflammatories (NSAID): Secondary | ICD-10-CM | POA: Diagnosis not present

## 2016-11-20 DIAGNOSIS — Z881 Allergy status to other antibiotic agents status: Secondary | ICD-10-CM | POA: Diagnosis not present

## 2016-11-20 DIAGNOSIS — Z883 Allergy status to other anti-infective agents status: Secondary | ICD-10-CM | POA: Diagnosis not present

## 2016-11-20 DIAGNOSIS — Z711 Person with feared health complaint in whom no diagnosis is made: Secondary | ICD-10-CM | POA: Diagnosis not present

## 2016-11-20 DIAGNOSIS — J449 Chronic obstructive pulmonary disease, unspecified: Secondary | ICD-10-CM | POA: Diagnosis not present

## 2016-11-20 DIAGNOSIS — K6389 Other specified diseases of intestine: Secondary | ICD-10-CM | POA: Diagnosis not present

## 2016-11-20 DIAGNOSIS — R197 Diarrhea, unspecified: Secondary | ICD-10-CM | POA: Diagnosis not present

## 2016-11-20 DIAGNOSIS — M810 Age-related osteoporosis without current pathological fracture: Secondary | ICD-10-CM | POA: Diagnosis not present

## 2016-11-20 DIAGNOSIS — R140 Abdominal distension (gaseous): Secondary | ICD-10-CM | POA: Diagnosis not present

## 2016-11-20 DIAGNOSIS — Z882 Allergy status to sulfonamides status: Secondary | ICD-10-CM | POA: Diagnosis not present

## 2016-11-24 ENCOUNTER — Other Ambulatory Visit: Payer: Self-pay | Admitting: Family Medicine

## 2016-11-29 ENCOUNTER — Telehealth: Payer: Self-pay | Admitting: Family Medicine

## 2016-12-08 ENCOUNTER — Encounter: Payer: Self-pay | Admitting: Family Medicine

## 2016-12-08 ENCOUNTER — Ambulatory Visit (INDEPENDENT_AMBULATORY_CARE_PROVIDER_SITE_OTHER): Payer: Medicare HMO | Admitting: Family Medicine

## 2016-12-08 VITALS — BP 98/68 | HR 77 | Temp 97.3°F | Ht 69.0 in | Wt 109.6 lb

## 2016-12-08 DIAGNOSIS — M26623 Arthralgia of bilateral temporomandibular joint: Secondary | ICD-10-CM

## 2016-12-08 DIAGNOSIS — F411 Generalized anxiety disorder: Secondary | ICD-10-CM | POA: Diagnosis not present

## 2016-12-08 DIAGNOSIS — R69 Illness, unspecified: Secondary | ICD-10-CM | POA: Diagnosis not present

## 2016-12-08 MED ORDER — METHOCARBAMOL 500 MG PO TABS: 500.0000 mg | ORAL_TABLET | Freq: Three times a day (TID) | ORAL | 3 refills | Status: DC | PRN

## 2016-12-08 MED ORDER — BUSPIRONE HCL 5 MG PO TABS: 5.0000 mg | ORAL_TABLET | Freq: Three times a day (TID) | ORAL | 1 refills | Status: DC

## 2016-12-08 MED ORDER — DULOXETINE HCL 30 MG PO CPEP: 30.0000 mg | ORAL_CAPSULE | Freq: Every day | ORAL | 1 refills | Status: DC

## 2016-12-08 NOTE — Patient Instructions (Signed)
Great to see you!  Come back in 1 month unless you need [name] ooner.   Stop baclofen I have started buspar, 1 pill 2-3 times daily.   Try methocarbamol for muscle relaxer.

## 2016-12-08 NOTE — Progress Notes (Signed)
   HPI  Patient presents today here with continued TMJ pain and anxiety.  Anxiety Valium is a bit too sedating, she is tolerating one half tablet daily. Cymbalta seemed to make her more anxious and 60 mg, she's taking 30 mg.  TMJ Baclofen is not working. Has tried Flexeril and tizanidine was too much sedation.  PMH: Smoking status noted ROS: Per HPI  Objective: BP 98/68   Pulse 77   Temp (!) 97.3 F (36.3 C) (Oral)   Ht 5\' 9"  (1.753 m)   Wt 109 lb 9.3 oz (49.7 kg)   BMI 16.18 kg/m  Gen: NAD, alert, cooperative with exam HEENT: NCAT, EOMI, PERRL CV: RRR, good S1/S2, no murmur Resp: CTABL, no wheezes, non-labored Ext: No edema, warm Neuro: Alert and oriented, No gross deficits  Assessment and plan:  # Anxiety, GAD Continue Cymbalta at 30 mg May discontinue Valium, patient states it causes great sedation. Starting BuSpar  # TMJ arthralgia Discontinue baclofen Trial of Robaxin Referring to oral surgery.  RTC 1 month   Orders Placed This Encounter  Procedures  . Ambulatory referral to Oral Maxillofacial Surgery    Referral Priority:   Routine    Referral Type:   Surgical    Referral Reason:   Specialty Services Required    Requested Specialty:   Oral Surgery    Number of Visits Requested:   1    Meds ordered this encounter  Medications  . diazepam (VALIUM) 5 MG tablet    Sig: Take 2.5 mg by mouth every 6 (six) hours as needed for anxiety.  . busPIRone (BUSPAR) 5 MG tablet    Sig: Take 1 tablet (5 mg total) by mouth 3 (three) times daily.    Dispense:  90 tablet    Refill:  1  . methocarbamol (ROBAXIN) 500 MG tablet    Sig: Take 1 tablet (500 mg total) by mouth every 8 (eight) hours as needed for muscle spasms.    Dispense:  30 tablet    Refill:  3  . DULoxetine (CYMBALTA) 30 MG capsule    Sig: Take 1 capsule (30 mg total) by mouth daily.    Dispense:  90 capsule    Refill:  [place], MD [facility] 12/08/2016, 7:02  PM

## 2016-12-13 ENCOUNTER — Other Ambulatory Visit: Payer: Self-pay | Admitting: Family Medicine

## 2016-12-19 ENCOUNTER — Other Ambulatory Visit: Payer: Self-pay | Admitting: Family Medicine

## 2016-12-19 DIAGNOSIS — Z1231 Encounter for screening mammogram for malignant neoplasm of breast: Secondary | ICD-10-CM | POA: Diagnosis not present

## 2016-12-20 ENCOUNTER — Ambulatory Visit: Payer: Medicare HMO | Admitting: Allergy and Immunology

## 2016-12-20 LAB — HM MAMMOGRAPHY

## 2016-12-31 ENCOUNTER — Other Ambulatory Visit: Payer: Self-pay | Admitting: Family Medicine

## 2016-12-31 MED ORDER — PREDNISONE 10 MG PO TABS: 10.0000 mg | ORAL_TABLET | Freq: Every day | ORAL | 0 refills | Status: DC

## 2017-01-09 ENCOUNTER — Other Ambulatory Visit: Payer: Self-pay | Admitting: Family Medicine

## 2017-02-09 ENCOUNTER — Other Ambulatory Visit: Payer: Self-pay | Admitting: Family Medicine

## 2017-02-13 DIAGNOSIS — M6289 Other specified disorders of muscle: Secondary | ICD-10-CM | POA: Diagnosis not present

## 2017-02-13 DIAGNOSIS — R159 Full incontinence of feces: Secondary | ICD-10-CM | POA: Diagnosis not present

## 2017-02-13 DIAGNOSIS — K219 Gastro-esophageal reflux disease without esophagitis: Secondary | ICD-10-CM | POA: Diagnosis not present

## 2017-02-13 DIAGNOSIS — K5904 Chronic idiopathic constipation: Secondary | ICD-10-CM | POA: Diagnosis not present

## 2017-03-03 ENCOUNTER — Encounter: Payer: Self-pay | Admitting: Family Medicine

## 2017-03-03 ENCOUNTER — Ambulatory Visit: Payer: Medicare HMO | Admitting: Family Medicine

## 2017-03-03 VITALS — BP 109/69 | HR 73 | Temp 97.0°F | Ht 69.0 in | Wt 109.0 lb

## 2017-03-03 DIAGNOSIS — S91125A Laceration with foreign body of left lesser toe(s) without damage to nail, initial encounter: Secondary | ICD-10-CM

## 2017-03-03 DIAGNOSIS — J0101 Acute recurrent maxillary sinusitis: Secondary | ICD-10-CM

## 2017-03-03 DIAGNOSIS — S91119A Laceration without foreign body of unspecified toe without damage to nail, initial encounter: Secondary | ICD-10-CM

## 2017-03-03 MED ORDER — BENZONATATE 200 MG PO CAPS: 200.0000 mg | ORAL_CAPSULE | Freq: Two times a day (BID) | ORAL | 0 refills | Status: DC | PRN

## 2017-03-03 NOTE — Progress Notes (Signed)
Subjective: CC: allergies and asthma PCP: Timmothy Euler, MD HEN:IDPOEU TIAH HECKEL is a 66 y.o. female presenting to clinic today for:  1. Allergy/ Asthma Patient reports onset of increased rhinorrhea, sinus pressure and headache, intermittent nonproductive cough.  She notes long-standing history of asthma and allergy.  She sees Dr. Neldon Mc for this.  She notes that she has been using Allegra, Nasonex, Astelin with little improvement in symptoms.  Denies fevers, chills, rash, facial tenderness, dental tenderness, purulent drainage from the nares.  No chest pain, shortness of breath or wheeze.  No visual disturbance.  2.  Toe lesion Patient reports that she noticed a lesion on the left fourth toe which is irritated.  She thinks she may have bumped her toe against something last week.  Denies purulent drainage, bleeding, increased warmth or redness.  She has not done anything for the lesion at this point.   ROS: Per HPI  Allergies  Allergen Reactions  . Cefuroxime Axetil Other (See Comments)    Extreme gas  . Augmentin [Amoxicillin-Pot Clavulanate] Diarrhea  . Avelox [Moxifloxacin Hcl In Nacl] Other (See Comments)    Tremors   . Biaxin [Clarithromycin]     Unsure of reaction  . Cedax [Ceftibuten] Other (See Comments)    tremors  . Ciprofloxacin Other (See Comments)    Blurred vision  . Clindamycin/Lincomycin     tachycardia  . Gabapentin     Constipation  . Levofloxacin Other (See Comments)    Feels dehydrated  . Lyrica [Pregabalin]     Drowsiness, dry mouth  . Singulair [Montelukast Sodium] Other (See Comments)    Numbness and tingling in hand.  . Sulfonamide Derivatives Other (See Comments)    Gi upset   Past Medical History:  Diagnosis Date  . Acid reflux   . Allergy   . Arthritis    ARTHRITIS IN NECK BY DR. Alroy Dust [facility]  . Asthma   . Interstitial cystitis   . Osteoporosis   . PONV (postoperative nausea and vomiting)   . Tinnitus   . Trigeminal neuralgia      Atypical trigeminal neuralgia    Current Outpatient Medications:  .  acetaminophen (TYLENOL) 500 MG tablet, Take by mouth., Disp: , Rfl:  .  albuterol (PROVENTIL HFA;VENTOLIN HFA) 108 (90 Base) MCG/ACT inhaler, Inhale two puffs every four to six hours as needed for cough or wheeze., Disp: 1 Inhaler, Rfl: 1 .  azelastine (ASTELIN) 0.1 % nasal spray, Place 2 sprays into both nostrils 2 (two) times daily. Use in each nostril as directed, Disp: 30 mL, Rfl: 5 .  b complex vitamins capsule, Take 1 capsule by mouth daily., Disp: , Rfl:  .  benzonatate (TESSALON) 200 MG capsule, Take 1 capsule (200 mg total) by mouth 2 (two) times daily as needed for cough., Disp: 40 capsule, Rfl: 0 .  DULoxetine (CYMBALTA) 30 MG capsule, Take 1 capsule (30 mg total) by mouth daily., Disp: 90 capsule, Rfl: 1 .  Fluticasone Furoate (ARNUITY ELLIPTA) 200 MCG/ACT AEPB, Inhale 1 puff into the lungs daily., Disp: 30 each, Rfl: 2 .  guaiFENesin (MUCINEX) 600 MG 12 hr tablet, Take 600 mg by mouth 2 (two) times daily as needed., Disp: , Rfl:  .  lidocaine (XYLOCAINE) 2 % jelly, Apply topically as needed., Disp: 30 mL, Rfl: 10 .  linaclotide (LINZESS) 290 MCG CAPS capsule, Take 1 capsule (290 mcg total) by mouth daily. To regulate bowel movements, Disp: 30 capsule, Rfl: 5 .  mometasone (NASONEX) 50  MCG/ACT nasal spray, Place 2 sprays into the nose daily., Disp: 17 g, Rfl: 12 .  pantoprazole (PROTONIX) 40 MG tablet, Take 40 mg by mouth daily. , Disp: , Rfl:  .  phenylephrine (SUDAFED PE) 10 MG TABS tablet, Take 10 mg by mouth every 4 (four) hours as needed., Disp: , Rfl:  .  Polyethylene Glycol 3350 (MIRALAX PO), Take by mouth as needed., Disp: , Rfl:  .  Probiotic Product (PROBIOTIC PO), Take by mouth daily., Disp: , Rfl:  .  ranitidine (ZANTAC) 150 MG tablet, Take 150 mg by mouth at bedtime., Disp: , Rfl:  .  Simethicone (GAS-X PO), Take by mouth., Disp: , Rfl:  .  traMADol (ULTRAM) 50 MG tablet, Take 1 tablet (50 mg total)  by mouth every 6 (six) hours as needed., Disp: 30 tablet, Rfl: 1 .  UNABLE TO FIND, Take 1 capsule by mouth daily. Dv3- vitamin D 3 plus immune support, Disp: , Rfl:  Social History   Socioeconomic History  . Marital status: Married    Spouse name: Not on file  . Number of children: Not on file  . Years of education: Not on file  . Highest education level: Not on file  Social Needs  . Financial resource strain: Not on file  . Food insecurity - worry: Not on file  . Food insecurity - inability: Not on file  . Transportation needs - medical: Not on file  . Transportation needs - non-medical: Not on file  Occupational History  . Not on file  Tobacco Use  . Smoking status: Never Smoker  . Smokeless tobacco: Never Used  Substance and Sexual Activity  . Alcohol use: No    Alcohol/week: 0.0 oz    Comment: 01-19-2016 per pt no  . Drug use: No    Comment: 01-19-2016 per pt no   . Sexual activity: No  Other Topics Concern  . Not on file  Social History Narrative   Lives with husband.    Family History  Problem Relation Age of Onset  . Hyperlipidemia Mother   . Arthritis Mother   . Cancer Mother   . Lymphoma Mother   . Cancer Father   . Allergies Father   . Allergies Sister   . Allergies Sister   . Allergic rhinitis Neg Hx   . Angioedema Neg Hx   . Asthma Neg Hx   . Eczema Neg Hx   . Immunodeficiency Neg Hx   . Urticaria Neg Hx     Objective: Office vital signs reviewed. BP 109/69   Pulse 73   Temp (!) 97 F (36.1 C) (Oral)   Ht 5\' 9"  (1.753 m)   Wt 109 lb (49.4 kg)   SpO2 100%   BMI 16.10 kg/m   Physical Examination:  General: Awake, alert, thin female, No acute distress HEENT: Mild tenderness to palpation to the frontal and maxillary sinuses.    Neck: No masses palpated.  Mildly enlarged left sub-mandibular lymph node.  Nontender.    Ears: Tympanic membranes intact, normal light reflex, no erythema, no bulging    Eyes: PERRLA, extraocular membranes intact,  sclera white    Nose: nasal turbinates moist, mildly erythematous.  No nasal discharge    Throat: moist mucus membranes, mild oropharyngeal erythema, no tonsillar exudate.  Airway is patent Cardio: regular rate and rhythm, S1S2 heard, no murmurs appreciated Pulm: clear to auscultation bilaterally, no wheezes, rhonchi or rales; normal work of breathing on room air Ext: Cool, +2 posterior  tibial pulses appreciated.  Left foot: Varicose veins appreciated.  Fibular aspect of left fourth toe with a small superficial breakdown of skin.  No exudate, purulence, bleeding.  No increased warmth or erythema appreciated. Neuro: Light touch sensation grossly intact.  Assessment/ Plan: 66 y.o. female   1. Acute recurrent maxillary sinusitis No evidence of bacterial infection on today's exam.  She has quite an extensive allergy history.  I recommended consideration for Nettie pot sinus rinses.  Instructions were reviewed with the patient on how to use this.  She may then use her Nasonex and Astelin if needed as directed.  Continue Allegra.  Consider using Sudafed short acting tablet a day for sinus relief instead of phenylephrine.  I did discuss with the patient that she should not use this longer than 2 days as the medication may raise blood pressure and have cardiovascular implications.  Signs and symptoms of infection were reviewed with the patient.  She voiced good understanding.  Follow-up with PCP or allergist as needed.  2. Laceration of skin of toe, initial encounter Would not describe as no overt ulceration.  This appears to be a laceration from the neighboring toes nail, which was long and irregular.  No evidence of infection on exam.  This was dressed with an antibiotic ointment and Band-Aid.  Home care instructions were reviewed with the patient and she voiced good understanding.  Signs and symptoms of infection were reviewed.  She voiced good understanding.  She will follow-up as needed.   Meds  ordered this encounter  Medications  . benzonatate (TESSALON) 200 MG capsule    Sig: Take 1 capsule (200 mg total) by mouth 2 (two) times daily as needed for cough.    Dispense:  40 capsule    Refill:  [place], DO [place] [id number]

## 2017-03-03 NOTE — Patient Instructions (Signed)
It appears that you have a viral upper respiratory infection (cold).  Cold symptoms can last up to 2 weeks.    As we discussed, continue the Allegra, Nasonex and Astelin.  You could consider obtaining low-dose Sudafed from the pharmacy to use for no more than 2 days if needed for persistent sinus fullness and headache.  Again, this can cause insomnia and does increase blood pressure.  Will consider using the [employer].  Remember to mix the saline packets with distilled water and use prior to the nasal sprays.  If you develop any other worrisome symptoms we discussed, please follow-up with myself or Dr. Wendi Snipes for further evaluation.  - Get plenty of rest and drink plenty of fluids. - Try to breathe moist air. Use a cold mist humidifier. - Consume warm fluids (soup or tea) to provide relief for a stuffy nose and to loosen phlegm. - For nasal stuffiness, try saline nasal spray or a Neti Pot. Afrin nasal spray can also be used but this product should not be used longer than 3 days or it will cause rebound nasal stuffiness (worsening nasal congestion). - For sore throat pain relief: suck on throat lozenges, hard candy or popsicles; gargle with warm salt water (1/4 tsp. salt per 8 oz. of water); and eat soft, bland foods. - Eat a well-balanced diet. If you cannot, ensure you are getting enough nutrients by taking a daily multivitamin. - Avoid dairy products, as they can thicken phlegm. - Avoid alcohol, as it impairs your body's immune system.  CONTACT YOUR DOCTOR IF YOU EXPERIENCE ANY OF THE FOLLOWING: - High fever - Ear pain - Sinus-type headache - Unusually severe cold symptoms - Cough that gets worse while other cold symptoms improve - Flare up of any chronic lung problem, such as asthma - Your symptoms persist longer than 2 weeks  Your toe lesion appears to be a result of the adjacent toe rubbing against it.  There is some ulceration and for this reason I recommend that you apply a salve like  an antibiotic ointment that is not Neosporin to the affected area and cover it.  Do this until the lesion has resolved.

## 2017-03-07 ENCOUNTER — Other Ambulatory Visit: Payer: Self-pay

## 2017-03-07 ENCOUNTER — Telehealth: Payer: Self-pay | Admitting: Allergy and Immunology

## 2017-03-07 MED ORDER — ALBUTEROL SULFATE HFA 108 (90 BASE) MCG/ACT IN AERS: INHALATION_SPRAY | RESPIRATORY_TRACT | 0 refills | Status: DC

## 2017-03-07 NOTE — Telephone Encounter (Signed)
Pt informed of refill. Reminded to keep appt.

## 2017-03-07 NOTE — Telephone Encounter (Signed)
Patient has run out of inhaler- Proventil. She has made a follow up for March 14, 2017 and would like courtesy refill to get to that appointment. [facility] [place]. Please call her back at [phone].

## 2017-03-14 ENCOUNTER — Ambulatory Visit: Payer: Medicare HMO | Admitting: Allergy and Immunology

## 2017-03-14 ENCOUNTER — Other Ambulatory Visit: Payer: Self-pay

## 2017-03-14 ENCOUNTER — Encounter: Payer: Self-pay | Admitting: Allergy and Immunology

## 2017-03-14 VITALS — BP 110/74 | HR 76 | Ht 69.0 in

## 2017-03-14 DIAGNOSIS — J4530 Mild persistent asthma, uncomplicated: Secondary | ICD-10-CM | POA: Diagnosis not present

## 2017-03-14 DIAGNOSIS — J3089 Other allergic rhinitis: Secondary | ICD-10-CM

## 2017-03-14 DIAGNOSIS — J019 Acute sinusitis, unspecified: Secondary | ICD-10-CM

## 2017-03-14 DIAGNOSIS — K219 Gastro-esophageal reflux disease without esophagitis: Secondary | ICD-10-CM

## 2017-03-14 MED ORDER — PANTOPRAZOLE SODIUM 40 MG PO TBEC
40.0000 mg | DELAYED_RELEASE_TABLET | Freq: Every day | ORAL | 5 refills | Status: DC
Start: 2017-03-14 — End: 2020-12-17

## 2017-03-14 MED ORDER — RANITIDINE HCL 150 MG PO TABS: 150.0000 mg | ORAL_TABLET | Freq: Every day | ORAL | 5 refills | Status: DC

## 2017-03-14 MED ORDER — ALBUTEROL SULFATE HFA 108 (90 BASE) MCG/ACT IN AERS: INHALATION_SPRAY | RESPIRATORY_TRACT | 0 refills | Status: DC

## 2017-03-14 MED ORDER — MOMETASONE FUROATE 50 MCG/ACT NA SUSP: 2.0000 | Freq: Every day | NASAL | 5 refills | Status: DC

## 2017-03-14 NOTE — Patient Instructions (Addendum)
  1. Continue to Treat reflux:   A. Continue off all caffeine including Excedrin  B. Protonix 40 mg once a day  C. ranitidine 150-300 mg in the evening if needed  2. Continue to Treat inflammation:   A. Nasonex 1-2 sprays each nostril one time per day   3. If needed:   A. nasal saline  B. Ventolin HFA or similar 2 puffs every 4-6 hours  C. OTC antihistamine, Azelastine  D. OTC decongestant tablet, OTC Mucinex DM  E. Nasal saline   4. Return to clinic in Summer 2019 or earlier if problem  5.  Obtain flu vaccine when better

## 2017-03-14 NOTE — Progress Notes (Signed)
Follow-up Note  Referring Provider: Timmothy Euler, MD Primary Provider: Timmothy Euler, MD Date of Office Visit: 03/14/2017  Subjective:   Kristy Garza (DOB: Dec 26, 1950) is a 66 y.o. female who returns to the [facility] [place] on 03/14/2017 in re-evaluation of the following:  HPI: Kristy Garza returns to this clinic in reevaluation of her asthma and allergic rhinitis and reflux induced respiratory disease.  She has not been seen in this clinic since April 2018.  Overall she thinks that she has done relatively well during the interval.  She rarely uses a short acting bronchodilator and has had very little issues with asthma and has not required the administration of a systemic steroid to treat an exacerbation.  She is no longer on any controller agents.  Her nose has been doing relatively well.  She did obtain a "cold" for which she saw her primary care doctor last week who treated her conservatively.  Her cold has been about 10 days duration and she might be slightly improved.  She still has a lot of drainage in her throat and throat clearing and some slight head fullness but overall appears to be doing better.  She did not develop any ugly nasal discharge or fever.  She has a history of LPR which is under pretty good control at this point in time on Protonix.  Occasionally she will add in ranitidine and evening.  She had a history of chronic cephalgia and she has been diagnosed with TMJ and trigeminal neuralgia in the past.  Overall this appears to be under pretty good control at this point in time.  She is using Cymbalta.  Allergies as of 03/14/2017      Reactions   Cefuroxime Axetil Other (See Comments)   Extreme gas   Augmentin [amoxicillin-pot Clavulanate] Diarrhea   Avelox [moxifloxacin Hcl In Nacl] Other (See Comments)   Tremors   Biaxin [clarithromycin]    Unsure of reaction   Cedax [ceftibuten] Other (See Comments)   tremors   Ciprofloxacin Other (See  Comments)   Blurred vision   Clindamycin/lincomycin    tachycardia   Gabapentin    Constipation   Levofloxacin Other (See Comments)   Feels dehydrated   Lyrica [pregabalin]    Drowsiness, dry mouth   Singulair [montelukast Sodium] Other (See Comments)   Numbness and tingling in hand.   Sulfonamide Derivatives Other (See Comments)   Gi upset      Medication List      acetaminophen 500 MG tablet Commonly known as:  TYLENOL Take by mouth.   albuterol 108 (90 Base) MCG/ACT inhaler Commonly known as:  PROVENTIL HFA;VENTOLIN HFA Inhale two puffs every four to six hours as needed for cough or wheeze.   azelastine 0.1 % nasal spray Commonly known as:  ASTELIN Place 2 sprays into both nostrils 2 (two) times daily. Use in each nostril as directed   b complex vitamins capsule Take 1 capsule by mouth daily.   benzonatate 200 MG capsule Commonly known as:  TESSALON Take 1 capsule (200 mg total) by mouth 2 (two) times daily as needed for cough.   DULoxetine 30 MG capsule Commonly known as:  CYMBALTA Take 1 capsule (30 mg total) by mouth daily.   fexofenadine 180 MG tablet Commonly known as:  ALLEGRA Take 180 mg by mouth daily.   Fluticasone Furoate 200 MCG/ACT Aepb Commonly known as:  ARNUITY ELLIPTA Inhale 1 puff into the lungs daily.   GAS-X PO Take  by mouth.   guaiFENesin 600 MG 12 hr tablet Commonly known as:  MUCINEX Take 600 mg by mouth 2 (two) times daily as needed.   hyoscyamine 0.125 MG Tbdp disintergrating tablet Commonly known as:  ANASPAZ Place 0.125 mg under the tongue.   lidocaine 2 % jelly Commonly known as:  XYLOCAINE Apply topically as needed.   linaclotide 290 MCG Caps capsule Commonly known as:  LINZESS Take 1 capsule (290 mcg total) by mouth daily. To regulate bowel movements   MIRALAX PO Take by mouth as needed.   mometasone 50 MCG/ACT nasal spray Commonly known as:  NASONEX Place 2 sprays into the nose daily.   pantoprazole 40 MG  tablet Commonly known as:  PROTONIX Take 40 mg by mouth daily.   phenylephrine 10 MG Tabs tablet Commonly known as:  SUDAFED PE Take 10 mg by mouth every 4 (four) hours as needed.   PROBIOTIC PO Take by mouth daily.   ranitidine 150 MG tablet Commonly known as:  ZANTAC Take 150 mg by mouth at bedtime.   traMADol 50 MG tablet Commonly known as:  ULTRAM Take 1 tablet (50 mg total) by mouth every 6 (six) hours as needed.   UNABLE TO FIND Take 1 capsule by mouth daily. Dv3- vitamin D 3 plus immune support       Past Medical History:  Diagnosis Date  . Acid reflux   . Allergy   . Arthritis    ARTHRITIS IN NECK BY DR. Alroy Dust [facility]  . Asthma   . Interstitial cystitis   . Osteoporosis   . PONV (postoperative nausea and vomiting)   . Tinnitus   . Trigeminal neuralgia    Atypical trigeminal neuralgia    Past Surgical History:  Procedure Laterality Date  . ABDOMINAL HYSTERECTOMY  2000  . CYSTOSCOPY WITH HYDRODISTENSION AND BIOPSY N/A 06/11/2012   Procedure: CYSTOSCOPY/BIOPSY/HYDRODISTENSION with Instillation of Pyridium and Marcaine and Kenalog;  Surgeon: Ailene Rud, MD;  Location: [facility];  Service: Urology;  Laterality: N/A;  1 hour requested for this case  BLADDER BIOPSY  . ETHMOIDECTOMY  2012  . SEPTOPLASTY  1980's  . vocal cord surgery   1990's   polyp removal    Review of systems negative except as noted in HPI / PMHx or noted below:  Review of Systems  Constitutional: Negative.   HENT: Negative.   Eyes: Negative.   Respiratory: Negative.   Cardiovascular: Negative.   Gastrointestinal: Negative.   Genitourinary: Negative.   Musculoskeletal: Negative.   Skin: Negative.   Neurological: Negative.   Endo/Heme/Allergies: Negative.   Psychiatric/Behavioral: Negative.      Objective:   Vitals:   03/14/17 1401  BP: 110/74  Pulse: 76  SpO2: (!) 20%   Height: 5\' 9"  (175.3 cm)      Physical Exam  Constitutional: She is  well-developed, well-nourished, and in no distress.  HENT:  Head: Normocephalic.  Right Ear: Tympanic membrane, external ear and ear canal normal.  Left Ear: Tympanic membrane, external ear and ear canal normal.  Nose: Nose normal. No mucosal edema or rhinorrhea.  Mouth/Throat: Uvula is midline, oropharynx is clear and moist and mucous membranes are normal. No oropharyngeal exudate.  Eyes: Conjunctivae are normal.  Neck: Trachea normal. No tracheal tenderness present. No tracheal deviation present. No thyromegaly present.  Cardiovascular: Normal rate, regular rhythm, S1 normal, S2 normal and normal heart sounds.  No murmur heard. Pulmonary/Chest: Breath sounds normal. No stridor. No respiratory distress. She has no  wheezes. She has no rales.  Musculoskeletal: She exhibits no edema.  Lymphadenopathy:       Head (right side): No tonsillar adenopathy present.       Head (left side): No tonsillar adenopathy present.    She has no cervical adenopathy.  Neurological: She is alert. Gait normal.  Skin: No rash noted. She is not diaphoretic. No erythema. Nails show no clubbing.  Psychiatric: Mood and affect normal.    Diagnostics:    Spirometry was performed and demonstrated an FEV1 of 2.56 at 96 % of predicted.  The patient had an Asthma Control Test with the following results: ACT Total Score: 23.    Assessment and Plan:   1. Asthma, well controlled, mild persistent   2. Other allergic rhinitis   3. LPRD (laryngopharyngeal reflux disease)     1. Continue to Treat reflux:   A. Continue off all caffeine including Excedrin  B. Protonix 40 mg once a day  C. ranitidine 150-300 mg in the evening if needed  2. Continue to Treat inflammation:   A. Nasonex 1-2 sprays each nostril one time per day   3. If needed:   A. nasal saline  B. Ventolin HFA or similar 2 puffs every 4-6 hours  C. OTC antihistamine, Azelastine  D. OTC decongestant tablet, OTC Mucinex DM  E. Nasal saline   4.  Return to clinic in Summer 2019 or earlier if problem  5.  Obtain flu vaccine when better  Audre appears to have developed a viral respiratory tract infection and I suspect that she will clear this over the course of the next week or so.  She has a large collection of medical therapy to choose from regarding symptomatic control of this issue.  Overall she has done well with her respiratory tract inflammatory condition with minimal amounts of medications and her reflux appears to be under pretty good control as well.  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  I will see her back in this clinic in approximately 6 months or earlier if there is a problem.  Allena Katz, MD Allergy / Immunology [place]

## 2017-03-15 ENCOUNTER — Encounter: Payer: Self-pay | Admitting: Allergy and Immunology

## 2017-03-17 ENCOUNTER — Other Ambulatory Visit: Payer: Self-pay | Admitting: Family Medicine

## 2017-03-17 ENCOUNTER — Telehealth: Payer: Self-pay | Admitting: Family Medicine

## 2017-03-17 MED ORDER — TIZANIDINE HCL 2 MG PO CAPS: 2.0000 mg | ORAL_CAPSULE | Freq: Three times a day (TID) | ORAL | 2 refills | Status: DC | PRN

## 2017-03-17 MED ORDER — DOXYCYCLINE HYCLATE 100 MG PO TABS: 100.0000 mg | ORAL_TABLET | Freq: Two times a day (BID) | ORAL | 0 refills | Status: DC

## 2017-03-17 MED ORDER — AMOXICILLIN 500 MG PO CAPS: 500.0000 mg | ORAL_CAPSULE | Freq: Three times a day (TID) | ORAL | 0 refills | Status: DC

## 2017-03-17 NOTE — Telephone Encounter (Signed)
Patient aware that Doxycycline has been sent to her pharmacy

## 2017-03-17 NOTE — Telephone Encounter (Signed)
What is the name of the medication? Tizanidine 2 mg  Have you contacted your pharmacy to request a refill? NO  Which [facility] [place]   Patient notified that their request is being sent to the clinical staff for review and that they should receive a call once it is complete. If they do not receive a call within 24 hours they can check with their pharmacy or our office.

## 2017-03-17 NOTE — Telephone Encounter (Signed)
Please advise- do not see on med list.

## 2017-03-17 NOTE — Telephone Encounter (Signed)
Persistent symps now for 2 additional weeks, considering the holiday weekend will send doxy.   Kristy Apple, MD [facility] 03/17/2017, 12:39 PM

## 2017-03-17 NOTE — Telephone Encounter (Signed)
Patient aware. Also states she seen Dr. Darnell Level 12/7 and is no better. Was not given any antibiotic and was told to take sudafed which patient states she is taking. C/o cough, nasal congestion and headache that is not any better. Please advise if something can be sent in or if patient ntbs.

## 2017-03-17 NOTE — Telephone Encounter (Signed)
Patient aware.

## 2017-03-17 NOTE — Telephone Encounter (Signed)
Changed to amox.   Laroy Apple, MD [facility] 03/17/2017, 5:05 PM

## 2017-03-17 NOTE — Telephone Encounter (Signed)
[facility] [place].   Laroy Apple, MD [facility] 03/17/2017, 11:33 AM

## 2017-03-26 ENCOUNTER — Other Ambulatory Visit: Payer: Self-pay | Admitting: Family Medicine

## 2017-03-27 ENCOUNTER — Other Ambulatory Visit: Payer: Self-pay | Admitting: Allergy and Immunology

## 2017-04-04 ENCOUNTER — Other Ambulatory Visit: Payer: Self-pay | Admitting: *Deleted

## 2017-04-04 MED ORDER — DULOXETINE HCL 30 MG PO CPEP: 30.0000 mg | ORAL_CAPSULE | Freq: Every day | ORAL | 0 refills | Status: DC

## 2017-04-10 ENCOUNTER — Other Ambulatory Visit: Payer: Self-pay | Admitting: Allergy & Immunology

## 2017-04-14 ENCOUNTER — Telehealth: Payer: Self-pay

## 2017-04-14 ENCOUNTER — Other Ambulatory Visit: Payer: Self-pay

## 2017-04-14 MED ORDER — DOXYCYCLINE HYCLATE 100 MG PO TABS: 100.0000 mg | ORAL_TABLET | Freq: Two times a day (BID) | ORAL | 0 refills | Status: AC

## 2017-04-14 MED ORDER — PREDNISONE 10 MG PO TABS: 20.0000 mg | ORAL_TABLET | Freq: Every day | ORAL | 0 refills | Status: AC

## 2017-04-14 NOTE — Telephone Encounter (Signed)
Please advise 

## 2017-04-14 NOTE — Telephone Encounter (Signed)
Called and spoke with patient and informed her that we are going to treat her for sinusitis/URI with prednisone and doxycycline and gave her the instructions.

## 2017-04-14 NOTE — Telephone Encounter (Signed)
Patient is calling back to see if we called in something

## 2017-04-14 NOTE — Telephone Encounter (Signed)
Symptoms of productive cough, nasal congestion, "red throat" per pt ongoing now for almost a week without any improvement in symptoms.    Would have her continue what she is taking already: allegra, mucinex dm and may use the sudafed only if needed.     Since symptoms are not improving will elect to treat for presumed for sinusitis/URI with doxycycline 100mg  twice a day x 7 days as well as prednisone 20mg  x 5 days.

## 2017-04-14 NOTE — Telephone Encounter (Signed)
Patient last seen with Dr. Neldon Mc on 03/14/2017. She has a bad cough, coughing up clear, stuffy nose, itchy watery eyes & red throat. Patient has been taking allergra, mucinex DM 12 hour, sudafed 30 mg every 4-6 hours since Saturday. She is wondering what else can she do. She is wondering if prednisone will help.   Please Advise  [facility]

## 2017-04-14 NOTE — Telephone Encounter (Signed)
Called to inform patient that I have sent a message to the doctor and I am waiting to hear back. I informed her that someone from the office will give her a call when we hear back.

## 2017-04-18 ENCOUNTER — Ambulatory Visit (INDEPENDENT_AMBULATORY_CARE_PROVIDER_SITE_OTHER): Payer: Medicare HMO | Admitting: Family Medicine

## 2017-04-18 ENCOUNTER — Encounter: Payer: Self-pay | Admitting: Family Medicine

## 2017-04-18 VITALS — BP 106/70 | HR 73 | Temp 96.8°F | Ht 69.0 in | Wt 115.0 lb

## 2017-04-18 DIAGNOSIS — J324 Chronic pansinusitis: Secondary | ICD-10-CM

## 2017-04-18 NOTE — Progress Notes (Signed)
Subjective: CC: URI PCP: Timmothy Euler, MD OVF:IEPPIR Kristy Garza is a 67 y.o. female presenting to clinic today for:  1. Cold symptoms  Chart review notes show that patient contacted her allergist on 04/14/2017 for URI symptoms that were not resolving with conservative OTC care.  She was prescribed doxycycline p.o. twice daily by 7 days and prednisone.  Patient reports head and chest congestion that started 12 days ago.  She notes mucus is clear.  She reports dental pain.  Her husband was sick with bronchitis that started around the same time.  Denies hemoptysis, SOB, dizziness, rash, nausea, vomiting, diarrhea, fevers, chills, myalgia, recent travel.  Patient has used Allegra, Sudafed, Mucinex DM, Nasonex and Astelin with some relief of symptoms.  She reports she has not picked up the medication sent in by her allergist and wanted to be evaluated prior to initiation of medicines.    ROS: Per HPI  Allergies  Allergen Reactions  . Cefuroxime Axetil Other (See Comments)    Extreme gas  . Augmentin [Amoxicillin-Pot Clavulanate] Diarrhea  . Avelox [Moxifloxacin Hcl In Nacl] Other (See Comments)    Tremors   . Biaxin [Clarithromycin]     Unsure of reaction  . Cedax [Ceftibuten] Other (See Comments)    tremors  . Ciprofloxacin Other (See Comments)    Blurred vision  . Clindamycin/Lincomycin     tachycardia  . Gabapentin     Constipation  . Levofloxacin Other (See Comments)    Feels dehydrated  . Lyrica [Pregabalin]     Drowsiness, dry mouth  . Singulair [Montelukast Sodium] Other (See Comments)    Numbness and tingling in hand.  . Sulfonamide Derivatives Other (See Comments)    Gi upset   Past Medical History:  Diagnosis Date  . Acid reflux   . Allergy   . Arthritis    ARTHRITIS IN NECK BY DR. Alroy Dust [facility]  . Asthma   . Interstitial cystitis   . Osteoporosis   . PONV (postoperative nausea and vomiting)   . Tinnitus   . Trigeminal neuralgia    Atypical trigeminal  neuralgia    Current Outpatient Medications:  .  acetaminophen (TYLENOL) 500 MG tablet, Take by mouth., Disp: , Rfl:  .  albuterol (PROVENTIL HFA;VENTOLIN HFA) 108 (90 Base) MCG/ACT inhaler, Inhale two puffs every four to six hours as needed for cough or wheeze., Disp: 18 Inhaler, Rfl: 0 .  amoxicillin (AMOXIL) 500 MG capsule, Take 1 capsule (500 mg total) by mouth 3 (three) times daily., Disp: 30 capsule, Rfl: 0 .  azelastine (ASTELIN) 0.1 % nasal spray, PLACE 2 SPRAYS INTO BOTH NOSTRILS 2 (TWO) TIMES DAILY. USE IN EACH NOSTRIL AS DIRECTED, Disp: 30 mL, Rfl: 2 .  b complex vitamins capsule, Take 1 capsule by mouth daily., Disp: , Rfl:  .  benzonatate (TESSALON) 200 MG capsule, TAKE 1 CAPSULE (200 MG TOTAL) BY MOUTH 2 (TWO) TIMES DAILY AS NEEDED FOR COUGH., Disp: 40 capsule, Rfl: 0 .  doxycycline (VIBRA-TABS) 100 MG tablet, Take 1 tablet (100 mg total) by mouth 2 (two) times daily for 7 days., Disp: 14 tablet, Rfl: 0 .  DULoxetine (CYMBALTA) 30 MG capsule, Take 1 capsule (30 mg total) by mouth daily., Disp: 30 capsule, Rfl: 0 .  fexofenadine (ALLEGRA) 180 MG tablet, Take 180 mg by mouth daily., Disp: , Rfl:  .  Fluticasone Furoate (ARNUITY ELLIPTA) 200 MCG/ACT AEPB, Inhale 1 puff into the lungs daily., Disp: 30 each, Rfl: 2 .  guaiFENesin ([place])  600 MG 12 hr tablet, Take 600 mg by mouth 2 (two) times daily as needed., Disp: , Rfl:  .  hyoscyamine (ANASPAZ) 0.125 MG TBDP disintergrating tablet, Place 0.125 mg under the tongue., Disp: , Rfl:  .  lidocaine (XYLOCAINE) 2 % jelly, Apply topically as needed., Disp: 30 mL, Rfl: 10 .  linaclotide (LINZESS) 290 MCG CAPS capsule, Take 1 capsule (290 mcg total) by mouth daily. To regulate bowel movements, Disp: 30 capsule, Rfl: 5 .  mometasone (NASONEX) 50 MCG/ACT nasal spray, Place 2 sprays into the nose daily., Disp: 17 g, Rfl: 5 .  pantoprazole (PROTONIX) 40 MG tablet, Take 1 tablet (40 mg total) by mouth daily., Disp: 30 tablet, Rfl: 5 .   phenylephrine (SUDAFED PE) 10 MG TABS tablet, Take 10 mg by mouth every 4 (four) hours as needed., Disp: , Rfl:  .  Polyethylene Glycol 3350 (MIRALAX PO), Take by mouth as needed., Disp: , Rfl:  .  predniSONE (DELTASONE) 10 MG tablet, Take 2 tablets (20 mg total) by mouth daily with breakfast for 5 days., Disp: 10 tablet, Rfl: 0 .  Probiotic Product (PROBIOTIC PO), Take by mouth daily., Disp: , Rfl:  .  ranitidine (ZANTAC) 150 MG tablet, Take 1 tablet (150 mg total) by mouth at bedtime., Disp: 60 tablet, Rfl: 5 .  Simethicone (GAS-X PO), Take by mouth., Disp: , Rfl:  .  tizanidine (ZANAFLEX) 2 MG capsule, Take 1 capsule (2 mg total) by mouth 3 (three) times daily as needed for muscle spasms., Disp: 60 capsule, Rfl: 2 .  traMADol (ULTRAM) 50 MG tablet, Take 1 tablet (50 mg total) by mouth every 6 (six) hours as needed., Disp: 30 tablet, Rfl: 1 .  UNABLE TO FIND, Take 1 capsule by mouth daily. Dv3- vitamin D 3 plus immune support, Disp: , Rfl:  Social History   Socioeconomic History  . Marital status: Married    Spouse name: Not on file  . Number of children: Not on file  . Years of education: Not on file  . Highest education level: Not on file  Social Needs  . Financial resource strain: Not on file  . Food insecurity - worry: Not on file  . Food insecurity - inability: Not on file  . Transportation needs - medical: Not on file  . Transportation needs - non-medical: Not on file  Occupational History  . Not on file  Tobacco Use  . Smoking status: Never Smoker  . Smokeless tobacco: Never Used  Substance and Sexual Activity  . Alcohol use: No    Alcohol/week: 0.0 oz    Comment: 01-19-2016 per pt no  . Drug use: No    Comment: 01-19-2016 per pt no   . Sexual activity: No  Other Topics Concern  . Not on file  Social History Narrative   Lives with husband.    Family History  Problem Relation Age of Onset  . Hyperlipidemia Mother   . Arthritis Mother   . Cancer Mother   . Lymphoma  Mother   . Cancer Father   . Allergies Father   . Allergies Sister   . Allergies Sister   . Allergic rhinitis Neg Hx   . Angioedema Neg Hx   . Asthma Neg Hx   . Eczema Neg Hx   . Immunodeficiency Neg Hx   . Urticaria Neg Hx     Objective: Office vital signs reviewed. BP 106/70   Pulse 73   Temp (!) 96.8 F (36 C) (Oral)  Ht 5\' 9"  (1.753 m)   Wt 115 lb (52.2 kg)   SpO2 100%   BMI 16.98 kg/m   Physical Examination:  General: Awake, alert, thin female, No acute distress HEENT: no frontal/ maxillary sinus TTP    Neck: No masses palpated. No lymphadenopathy    Ears: Tympanic membranes intact, normal light reflex, no erythema, no bulging    Eyes: PERRLA, extraocular membranes intact, sclera white    Nose: nasal turbinates moist/ pale, clear nasal discharge    Throat: moist mucus membranes, no erythema, no tonsillar exudate.  Airway is patent Cardio: regular rate and rhythm, S1S2 heard, no murmurs appreciated Pulm: clear to auscultation bilaterally, no wheezes, rhonchi or rales; normal work of breathing on room air GI: soft, non-tender, non-distended, bowel sounds present x4, no hepatomegaly, no splenomegaly, no masses  Assessment/ Plan: 67 y.o. female   1. Chronic pansinusitis Patient is afebrile and nontoxic-appearing.  Her physical exam was remarkable for pale nasal turbinates.  No evidence of acute bacterial sinus infection on today's exam.  I agree with holding off on steroid and antibiotic for now.  She may continue home care regimen.  I did discuss with her that Sudafed should be limited to no more than a couple of days use.  Home care instructions were reviewed with patient.  Encouraged her to follow with PCP/allergist.  Consider referral to ear nose and throat doctor given persistent/chronic symptoms.  Strict return precautions and reasons for emergent evaluation in the emergency department review with patient.  She voiced understanding and will follow-up as  needed.  [name], DO [place] [phone]

## 2017-05-16 ENCOUNTER — Telehealth: Payer: Self-pay | Admitting: Allergy and Immunology

## 2017-05-16 NOTE — Telephone Encounter (Signed)
Please inform patient that this would be a very long time for a viral infection and she may have rolled over into a persistent sinus infection.  Let us get a sinus CT scan to look at her sinuses.

## 2017-05-16 NOTE — Telephone Encounter (Signed)
Patient was last seen 03-14-17. She said she had a viral infection at the time and was told if she didn't get better to call back. She said she is still having the same symptoms, but now has watery, red eyes. She has been diagnosed with dry eye and doesn't know if her eye issues are from that or a result of her viral infection.

## 2017-05-16 NOTE — Telephone Encounter (Signed)
Can an order please be put in the patient's chart, so that I have the information at hand for the PA ??

## 2017-05-16 NOTE — Telephone Encounter (Signed)
We need to get a PA for the CT scan and get one scheduled. Please and thank you

## 2017-05-16 NOTE — Telephone Encounter (Signed)
Please advise 

## 2017-05-16 NOTE — Telephone Encounter (Signed)
Pt states she is coughing with headaches and nasal congestion. SHe thinks it still viral an wants to know if viral can last this long?  Please advise

## 2017-05-17 ENCOUNTER — Ambulatory Visit: Payer: Self-pay | Admitting: Internal Medicine

## 2017-05-25 ENCOUNTER — Other Ambulatory Visit: Payer: Self-pay | Admitting: Family Medicine

## 2017-05-26 NOTE — Telephone Encounter (Signed)
Last see 1/19

## 2017-06-02 ENCOUNTER — Encounter: Payer: Self-pay | Admitting: Nurse Practitioner

## 2017-06-02 ENCOUNTER — Ambulatory Visit (INDEPENDENT_AMBULATORY_CARE_PROVIDER_SITE_OTHER): Payer: Medicare HMO | Admitting: Nurse Practitioner

## 2017-06-02 VITALS — BP 104/71 | HR 83 | Temp 97.4°F | Ht 69.0 in | Wt 117.0 lb

## 2017-06-02 DIAGNOSIS — N3090 Cystitis, unspecified without hematuria: Secondary | ICD-10-CM

## 2017-06-02 DIAGNOSIS — R300 Dysuria: Secondary | ICD-10-CM | POA: Diagnosis not present

## 2017-06-02 LAB — URINALYSIS, COMPLETE
BILIRUBIN UA: NEGATIVE
Glucose, UA: NEGATIVE
Ketones, UA: NEGATIVE
NITRITE UA: NEGATIVE
PH UA: 6 (ref 5.0–7.5)
Protein, UA: NEGATIVE
RBC UA: NEGATIVE
Specific Gravity, UA: 1.015 (ref 1.005–1.030)
UUROB: 0.2 mg/dL (ref 0.2–1.0)

## 2017-06-02 LAB — MICROSCOPIC EXAMINATION
Epithelial Cells (non renal): NONE SEEN /hpf (ref 0–10)
RBC MICROSCOPIC, UA: NONE SEEN /HPF (ref 0–?)
RENAL EPITHEL UA: NONE SEEN /HPF

## 2017-06-02 MED ORDER — DOXYCYCLINE HYCLATE 100 MG PO TABS: 100.0000 mg | ORAL_TABLET | Freq: Two times a day (BID) | ORAL | 0 refills | Status: DC

## 2017-06-02 NOTE — Progress Notes (Signed)
   Subjective:    Patient ID: Kristy Garza, female    DOB: 1950/12/08, 67 y.o.   MRN: [id number]  HPI Patient comes in today c/o dysuria- started Wednesday. She has history of  interstitual cystitis.    Review of Systems  Constitutional: Negative.   Respiratory: Negative.   Cardiovascular: Negative.   Genitourinary: Positive for dysuria, pelvic pain and urgency. Negative for frequency.  Neurological: Negative.   Psychiatric/Behavioral: Negative.   All other systems reviewed and are negative.      Objective:   Physical Exam  Constitutional: She is oriented to person, place, and time. She appears well-nourished. No distress.  Cardiovascular: Normal rate and regular rhythm.  Pulmonary/Chest: Effort normal and breath sounds normal.  Neurological: She is alert and oriented to person, place, and time.  Skin: Skin is warm.  Psychiatric: She has a normal mood and affect. Her behavior is normal. Judgment and thought content normal.   BP 104/71   Pulse 83   Temp (!) 97.4 F (36.3 C) (Oral)   Ht 5\' 9"  (1.753 m)   Wt 117 lb (53.1 kg)   BMI 17.28 kg/m   UA - + leuks      Assessment & Plan:   1. Dysuria   2. Cystitis    Meds ordered this encounter  Medications  . doxycycline (VIBRA-TABS) 100 MG tablet    Sig: Take 1 tablet (100 mg total) by mouth 2 (two) times daily. 1 po bid    Dispense:  14 tablet    Refill:  0    Order Specific Question:   Supervising Provider    Answer:   Evette Doffing, CAROL L [[id number]]   Take medication as prescribe Cotton underwear Take shower not bath Cranberry juice, yogurt Force fluids AZO over the counter X2 days Culture pending RTO prn  Mary-Margaret Hassell Done, FNP

## 2017-06-02 NOTE — Patient Instructions (Signed)
Take medication as prescribe Cotton underwear Take shower not bath Cranberry juice, yogurt Force fluids AZO over the counter X2 days Culture pending RTO prn  

## 2017-06-04 LAB — URINE CULTURE

## 2017-06-06 ENCOUNTER — Telehealth: Payer: Self-pay | Admitting: Nurse Practitioner

## 2017-06-06 NOTE — Telephone Encounter (Signed)
Patient aware.

## 2017-06-06 NOTE — Telephone Encounter (Signed)
FYI, patient is still experiencing burning with urination.  I explained to her she should increase her fluids and continue the antibiotic she had just started on Friday.  If symptoms change or worsen, I advised her to call us back.

## 2017-06-06 NOTE — Telephone Encounter (Signed)
Culture grew ecoli- was susceptible to doxycycine - should resolve it. Allergic to to many things to give another antibiotic. Follow up with dr, Wendi Snipes

## 2017-06-08 ENCOUNTER — Telehealth: Payer: Self-pay | Admitting: Family Medicine

## 2017-06-08 ENCOUNTER — Ambulatory Visit (INDEPENDENT_AMBULATORY_CARE_PROVIDER_SITE_OTHER): Payer: Medicare HMO | Admitting: Nurse Practitioner

## 2017-06-08 ENCOUNTER — Encounter: Payer: Self-pay | Admitting: Nurse Practitioner

## 2017-06-08 VITALS — BP 89/62 | HR 77 | Temp 97.3°F | Ht 69.0 in | Wt 114.0 lb

## 2017-06-08 DIAGNOSIS — R5383 Other fatigue: Secondary | ICD-10-CM

## 2017-06-08 DIAGNOSIS — R300 Dysuria: Secondary | ICD-10-CM | POA: Diagnosis not present

## 2017-06-08 LAB — MICROSCOPIC EXAMINATION
RBC MICROSCOPIC, UA: NONE SEEN /HPF (ref 0–?)
RENAL EPITHEL UA: NONE SEEN /HPF

## 2017-06-08 LAB — URINALYSIS, COMPLETE
Bilirubin, UA: NEGATIVE
Glucose, UA: NEGATIVE
Ketones, UA: NEGATIVE
NITRITE UA: NEGATIVE
PH UA: 7 (ref 5.0–7.5)
Protein, UA: NEGATIVE
RBC, UA: NEGATIVE
Specific Gravity, UA: <1.005 — ABNORMAL LOW (ref 1.005–1.030)
Urobilinogen, Ur: 0.2 mg/dL (ref 0.2–1.0)

## 2017-06-08 LAB — HEMOGLOBIN, FINGERSTICK: Hemoglobin: 11.7 g/dL (ref 11.1–15.9)

## 2017-06-08 NOTE — Progress Notes (Signed)
   Subjective:    Patient ID: Kristy Garza, female    DOB: 06/18/1950, 67 y.o.   MRN: [id number]  HPI patient was seen on 06/02/17 with c/o dysuria. At that time her urine really did not look bad. Due to patient ben=ing so symptomatic we treated her with Doxycycline. Her culture grew e coli which is susceptible to doxy. She come sin today stating she is still having some dysuria abd frequency.  * she is also c/o fatigue- wants hgb checked  Review of Systems  Constitutional: Negative.   HENT: Negative.   Respiratory: Negative.   Cardiovascular: Negative.   Genitourinary: Positive for dysuria and frequency.  Neurological: Negative.   Psychiatric/Behavioral: Negative.   All other systems reviewed and are negative.      Objective:   Physical Exam  Constitutional: She is oriented to person, place, and time. She appears well-developed and well-nourished. No distress.  Cardiovascular: Normal rate and regular rhythm.  Pulmonary/Chest: Effort normal and breath sounds normal.  Neurological: She is alert and oriented to person, place, and time.  Skin: Skin is warm.  Psychiatric: She has a normal mood and affect. Her behavior is normal. Judgment and thought content normal.   BP (!) 89/62   Pulse 77   Temp (!) 97.3 F (36.3 C) (Oral)   Ht 5\' 9"  (1.753 m)   Wt 114 lb (51.7 kg)   BMI 16.83 kg/m   UA leuks (+) otherwise negative HGB 11.7%      Assessment & Plan:  1. Fatigue, unspecified type hgb ok - Hemoglobin, fingerstick  2. Dysuria Referral to urology Force fluids - Urinalysis, Complete - Ambulatory referral to Urology  [place], [place]

## 2017-06-12 ENCOUNTER — Encounter: Payer: Self-pay | Admitting: Family Medicine

## 2017-06-12 ENCOUNTER — Ambulatory Visit (INDEPENDENT_AMBULATORY_CARE_PROVIDER_SITE_OTHER): Payer: Medicare HMO | Admitting: Family Medicine

## 2017-06-12 VITALS — BP 104/73 | HR 74 | Temp 96.2°F | Ht 69.0 in | Wt 113.2 lb

## 2017-06-12 DIAGNOSIS — R05 Cough: Secondary | ICD-10-CM | POA: Diagnosis not present

## 2017-06-12 DIAGNOSIS — Z0000 Encounter for general adult medical examination without abnormal findings: Secondary | ICD-10-CM | POA: Diagnosis not present

## 2017-06-12 MED ORDER — LIDOCAINE HCL 2 % EX GEL
CUTANEOUS | 10 refills | Status: DC | PRN
Start: 2017-06-12 — End: 2018-06-18

## 2017-06-12 MED ORDER — BENZONATATE 200 MG PO CAPS: 200.0000 mg | ORAL_CAPSULE | Freq: Two times a day (BID) | ORAL | 0 refills | Status: DC | PRN

## 2017-06-12 NOTE — Progress Notes (Signed)
   HPI  Patient presents today here for an annual physical  She state that she has an increased cough compared to normal. She has recently been treated with doxy + prednisone. Needs refill of tessalon which work well for her.   She believes she had a mammo last year  Declines pneumovax today  PMH: Smoking status noted ROS: Per HPI  Objective: BP 104/73   Pulse 74   Temp (!) 96.2 F (35.7 C) (Oral)   Ht '5\' 9"'$  (1.753 m)   Wt 113 lb 3.2 oz (51.3 kg)   BMI 16.72 kg/m  Gen: NAD, alert, cooperative with exam HEENT: NCAT, EOMI, PERRL CV: RRR, good S1/S2, no murmur Resp: CTABL, no wheezes, non-labored Abd: SNTND, BS present, no guarding or organomegaly Ext: No edema, warm Neuro: Alert and oriented, No gross deficits  Assessment and plan:  # Annual physical exam Pt has gained some weight compared to last year Labs today C/o chronic cough, this has been a longstanding issue, pulmonology has stated films appear to be emphysema, Spirometry is normal.    Orders Placed This Encounter  Procedures  . CMP14+EGFR  . Lipid panel  . TSH  . CBC with Differential/Platelet    Meds ordered this encounter  Medications  . lidocaine (XYLOCAINE) 2 % jelly    Sig: Apply topically as needed.    Dispense:  30 mL    Refill:  10  . benzonatate (TESSALON) 200 MG capsule    Sig: Take 1 capsule (200 mg total) by mouth 2 (two) times daily as needed for cough.    Dispense:  40 capsule    Refill:  [place], MD [place] 06/12/2017, 11:52 AM

## 2017-06-12 NOTE — Patient Instructions (Signed)
Great to see you!   

## 2017-06-13 LAB — CMP14+EGFR
ALT: 13 IU/L (ref 0–32)
AST: 23 IU/L (ref 0–40)
Albumin/Globulin Ratio: 1.7 (ref 1.2–2.2)
Albumin: 4.3 g/dL (ref 3.6–4.8)
Alkaline Phosphatase: 101 IU/L (ref 39–117)
BUN/Creatinine Ratio: 15 (ref 12–28)
BUN: 12 mg/dL (ref 8–27)
Bilirubin Total: 0.3 mg/dL (ref 0.0–1.2)
CALCIUM: 9.5 mg/dL (ref 8.7–10.3)
CO2: 22 mmol/L (ref 20–29)
CREATININE: 0.82 mg/dL (ref 0.57–1.00)
Chloride: 103 mmol/L (ref 96–106)
GFR calc Af Amer: 86 mL/min/{1.73_m2} (ref >59)
GFR, EST NON AFRICAN AMERICAN: 75 mL/min/{1.73_m2} (ref >59)
Globulin, Total: 2.5 g/dL (ref 1.5–4.5)
Glucose: 81 mg/dL (ref 65–99)
Potassium: 4.4 mmol/L (ref 3.5–5.2)
Sodium: 141 mmol/L (ref 134–144)
TOTAL PROTEIN: 6.8 g/dL (ref 6.0–8.5)

## 2017-06-13 LAB — CBC WITH DIFFERENTIAL/PLATELET
BASOS: 0 %
Basophils Absolute: 0 10*3/uL (ref 0.0–0.2)
EOS (ABSOLUTE): 0.1 10*3/uL (ref 0.0–0.4)
Eos: 4 %
Hematocrit: 35.2 % (ref 34.0–46.6)
Hemoglobin: 11.5 g/dL (ref 11.1–15.9)
IMMATURE GRANS (ABS): 0 10*3/uL (ref 0.0–0.1)
Immature Granulocytes: 0 %
Lymphocytes Absolute: 1 10*3/uL (ref 0.7–3.1)
Lymphs: 32 %
MCH: 29.7 pg (ref 26.6–33.0)
MCHC: 32.7 g/dL (ref 31.5–35.7)
MCV: 91 fL (ref 79–97)
MONOS ABS: 0.2 10*3/uL (ref 0.1–0.9)
Monocytes: 8 %
NEUTROS ABS: 1.7 10*3/uL (ref 1.4–7.0)
Neutrophils: 56 %
PLATELETS: 208 10*3/uL (ref 150–379)
RBC: 3.87 x10E6/uL (ref 3.77–5.28)
RDW: 12.5 % (ref 12.3–15.4)
WBC: 3.1 10*3/uL — ABNORMAL LOW (ref 3.4–10.8)

## 2017-06-13 LAB — LIPID PANEL
CHOL/HDL RATIO: 2.7 ratio (ref 0.0–4.4)
Cholesterol, Total: 177 mg/dL (ref 100–199)
HDL: 65 mg/dL (ref >39)
LDL CALC: 102 mg/dL — AB (ref 0–99)
TRIGLYCERIDES: 50 mg/dL (ref 0–149)
VLDL Cholesterol Cal: 10 mg/dL (ref 5–40)

## 2017-06-13 LAB — TSH: TSH: 0.474 u[IU]/mL (ref 0.450–4.500)

## 2017-07-05 ENCOUNTER — Other Ambulatory Visit: Payer: Self-pay | Admitting: Family Medicine

## 2017-07-17 DIAGNOSIS — M5134 Other intervertebral disc degeneration, thoracic region: Secondary | ICD-10-CM | POA: Diagnosis not present

## 2017-07-17 DIAGNOSIS — M9903 Segmental and somatic dysfunction of lumbar region: Secondary | ICD-10-CM | POA: Diagnosis not present

## 2017-07-17 DIAGNOSIS — M5136 Other intervertebral disc degeneration, lumbar region: Secondary | ICD-10-CM | POA: Diagnosis not present

## 2017-07-17 DIAGNOSIS — M9901 Segmental and somatic dysfunction of cervical region: Secondary | ICD-10-CM | POA: Diagnosis not present

## 2017-07-17 DIAGNOSIS — M9902 Segmental and somatic dysfunction of thoracic region: Secondary | ICD-10-CM | POA: Diagnosis not present

## 2017-07-17 DIAGNOSIS — M5137 Other intervertebral disc degeneration, lumbosacral region: Secondary | ICD-10-CM | POA: Diagnosis not present

## 2017-07-17 DIAGNOSIS — M9904 Segmental and somatic dysfunction of sacral region: Secondary | ICD-10-CM | POA: Diagnosis not present

## 2017-07-17 DIAGNOSIS — M5032 Other cervical disc degeneration, mid-cervical region, unspecified level: Secondary | ICD-10-CM | POA: Diagnosis not present

## 2017-07-18 ENCOUNTER — Other Ambulatory Visit: Payer: Self-pay | Admitting: Family Medicine

## 2017-07-19 NOTE — Telephone Encounter (Signed)
Last seen 06/12/17  Dr Wendi Snipes

## 2017-07-27 DIAGNOSIS — M5032 Other cervical disc degeneration, mid-cervical region, unspecified level: Secondary | ICD-10-CM | POA: Diagnosis not present

## 2017-07-27 DIAGNOSIS — M5134 Other intervertebral disc degeneration, thoracic region: Secondary | ICD-10-CM | POA: Diagnosis not present

## 2017-07-27 DIAGNOSIS — M9903 Segmental and somatic dysfunction of lumbar region: Secondary | ICD-10-CM | POA: Diagnosis not present

## 2017-07-27 DIAGNOSIS — M9901 Segmental and somatic dysfunction of cervical region: Secondary | ICD-10-CM | POA: Diagnosis not present

## 2017-07-27 DIAGNOSIS — M9902 Segmental and somatic dysfunction of thoracic region: Secondary | ICD-10-CM | POA: Diagnosis not present

## 2017-07-27 DIAGNOSIS — M5136 Other intervertebral disc degeneration, lumbar region: Secondary | ICD-10-CM | POA: Diagnosis not present

## 2017-07-27 DIAGNOSIS — M9904 Segmental and somatic dysfunction of sacral region: Secondary | ICD-10-CM | POA: Diagnosis not present

## 2017-07-27 DIAGNOSIS — M5137 Other intervertebral disc degeneration, lumbosacral region: Secondary | ICD-10-CM | POA: Diagnosis not present

## 2017-08-08 ENCOUNTER — Encounter: Payer: Self-pay | Admitting: Family Medicine

## 2017-08-08 ENCOUNTER — Ambulatory Visit (INDEPENDENT_AMBULATORY_CARE_PROVIDER_SITE_OTHER): Payer: Medicare HMO | Admitting: Family Medicine

## 2017-08-08 ENCOUNTER — Ambulatory Visit (INDEPENDENT_AMBULATORY_CARE_PROVIDER_SITE_OTHER): Payer: Medicare HMO

## 2017-08-08 VITALS — BP 119/77 | HR 77 | Temp 97.7°F | Ht 69.0 in | Wt 116.6 lb

## 2017-08-08 DIAGNOSIS — M542 Cervicalgia: Secondary | ICD-10-CM | POA: Diagnosis not present

## 2017-08-08 DIAGNOSIS — R519 Headache, unspecified: Secondary | ICD-10-CM

## 2017-08-08 DIAGNOSIS — R51 Headache: Secondary | ICD-10-CM

## 2017-08-08 DIAGNOSIS — M810 Age-related osteoporosis without current pathological fracture: Secondary | ICD-10-CM

## 2017-08-08 DIAGNOSIS — K5900 Constipation, unspecified: Secondary | ICD-10-CM | POA: Diagnosis not present

## 2017-08-08 DIAGNOSIS — Z780 Asymptomatic menopausal state: Secondary | ICD-10-CM | POA: Diagnosis not present

## 2017-08-08 DIAGNOSIS — M8000XD Age-related osteoporosis with current pathological fracture, unspecified site, subsequent encounter for fracture with routine healing: Secondary | ICD-10-CM

## 2017-08-08 MED ORDER — METHYLPREDNISOLONE ACETATE 40 MG/ML IJ SUSP
40.0000 mg | Freq: Once | INTRAMUSCULAR | Status: AC
  Administered 2017-08-08: 40 mg via INTRAMUSCULAR

## 2017-08-08 MED ORDER — LACTULOSE 10 GM/15ML PO SOLN: 30.0000 g | Freq: Two times a day (BID) | ORAL | 0 refills | Status: DC | PRN

## 2017-08-08 MED ORDER — AMOXICILLIN 875 MG PO TABS: 875.0000 mg | ORAL_TABLET | Freq: Two times a day (BID) | ORAL | 0 refills | Status: DC

## 2017-08-08 NOTE — Progress Notes (Signed)
   HPI  Patient presents today for follow-up headaches, also to discuss neck pain and osteoporosis.  Osteoporosis Patient using Citracal but does want to do more. She is interested in Prolia  Headaches Patient states that she has almost daily headaches, I merrily frontal sharp stabbing headaches that lasts hours without treatment.  They respond to Excedrin but not Tylenol. She wonders if they are associated with her neck pain.  Patient's had neck pain for years, however it is worse over the last few months. She describes bilateral arm symptoms on the lateral forearms.  She denies any hand weakness.  PMH: Smoking status noted ROS: Per HPI  Objective: BP 119/77   Pulse 77   Temp 97.7 F (36.5 C) (Oral)   Ht 5\' 9"  (1.753 m)   Wt 116 lb 9.6 oz (52.9 kg)   BMI 17.22 kg/m  Gen: NAD, alert, cooperative with exam HEENT: NCAT, tenderness to palpation of the frontal sinuses CV: RRR, good S1/S2, no murmur Resp: CTABL, no wheezes, non-labored Ext: No edema, warm Neuro: Alert and oriented, No gross deficits MSK-no significant tenderness to palpation of the midline spine or paraspinal muscles, she does report mild tenderness to palpation however  Assessment and plan:  #Osteoporosis Repeat DEXA, severe range T score at -4.6 a few years ago. Prolia is a good treatment for her Continue calcium  #Headache Possible chronic sinusitis, however there could be more to her headaches. Neurology  Treating for sinusitis today with amoxicillin plus IM Depo-Medrol  #Neck pain With bilateral radicular symptoms Refer to neurology Previous MRI, approximately 3 to 4 years ago, was normal.\  Obstipation Taking Linzess, mild improvement Still not stooling regularly, adding lactulose PRN, continue miralax as daily maint med    Orders Placed This Encounter  Procedures  . DG [facility] DEXA    Order Specific Question:   Reason for Exam (SYMPTOM  OR DIAGNOSIS REQUIRED)    Answer:   repeat exam,  osteoporosis  . Ambulatory referral to Neurology    Referral Priority:   Routine    Referral Type:   Consultation    Referral Reason:   Specialty Services Required    Requested Specialty:   Neurology    Number of Visits Requested:   1    Meds ordered this encounter  Medications  . methylPREDNISolone acetate (DEPO-MEDROL) injection 40 mg  . lactulose (CHRONULAC) 10 GM/15ML solution    Sig: Take 45 mLs (30 g total) by mouth 2 (two) times daily as needed for mild constipation.    Dispense:  240 mL    Refill:  0  . amoxicillin (AMOXIL) 875 MG tablet    Sig: Take 1 tablet (875 mg total) by mouth 2 (two) times daily.    Dispense:  20 tablet    Refill:  0    Laroy Apple, MD [facility] 08/08/2017, 8:36 AM

## 2017-08-08 NOTE — Patient Instructions (Signed)
Great to see you!   

## 2017-08-10 ENCOUNTER — Other Ambulatory Visit: Payer: Self-pay | Admitting: Allergy and Immunology

## 2017-08-10 DIAGNOSIS — K5904 Chronic idiopathic constipation: Secondary | ICD-10-CM | POA: Diagnosis not present

## 2017-08-15 ENCOUNTER — Other Ambulatory Visit: Payer: Self-pay | Admitting: Pediatrics

## 2017-08-15 DIAGNOSIS — R51 Headache: Secondary | ICD-10-CM | POA: Diagnosis not present

## 2017-08-15 DIAGNOSIS — M542 Cervicalgia: Secondary | ICD-10-CM | POA: Diagnosis not present

## 2017-08-18 ENCOUNTER — Encounter: Payer: Self-pay | Admitting: Family Medicine

## 2017-08-18 ENCOUNTER — Ambulatory Visit (INDEPENDENT_AMBULATORY_CARE_PROVIDER_SITE_OTHER): Payer: Medicare HMO | Admitting: Family Medicine

## 2017-08-18 ENCOUNTER — Other Ambulatory Visit: Payer: Self-pay | Admitting: *Deleted

## 2017-08-18 ENCOUNTER — Telehealth: Payer: Self-pay | Admitting: Internal Medicine

## 2017-08-18 ENCOUNTER — Telehealth: Payer: Self-pay | Admitting: Family Medicine

## 2017-08-18 VITALS — BP 112/72 | HR 93 | Temp 99.8°F | Ht 69.0 in | Wt 111.2 lb

## 2017-08-18 DIAGNOSIS — R0982 Postnasal drip: Secondary | ICD-10-CM | POA: Diagnosis not present

## 2017-08-18 DIAGNOSIS — H6121 Impacted cerumen, right ear: Secondary | ICD-10-CM | POA: Diagnosis not present

## 2017-08-18 DIAGNOSIS — G500 Trigeminal neuralgia: Secondary | ICD-10-CM | POA: Diagnosis not present

## 2017-08-18 MED ORDER — DOXYCYCLINE HYCLATE 100 MG PO TABS: 100.0000 mg | ORAL_TABLET | Freq: Two times a day (BID) | ORAL | 0 refills | Status: DC

## 2017-08-18 MED ORDER — TRAMADOL HCL 50 MG PO TABS: 50.0000 mg | ORAL_TABLET | Freq: Four times a day (QID) | ORAL | 1 refills | Status: DC | PRN

## 2017-08-18 NOTE — Telephone Encounter (Signed)
appt made

## 2017-08-18 NOTE — Telephone Encounter (Signed)
Called and spoke with patient, she states that she saw her PCP on 5.14.19 and prescribed her amoxicillin as well as gave her a depo shot. The medication was not working so they switched her to doxycycline. Patient has taken two doses of this and it has not helped. Patient states that she is still having a productive cough as well as a sore red throat. Denies fever and chest pain. No other symptoms.   CY please advise, thank you.    Current Outpatient Medications on File Prior to Visit  Medication Sig Dispense Refill  . acetaminophen (TYLENOL) 500 MG tablet Take by mouth.    [name] albuterol (PROVENTIL HFA;VENTOLIN HFA) 108 (90 Base) MCG/ACT inhaler Inhale two puffs every four to six hours as needed for cough or wheeze. 18 Inhaler 0  . amoxicillin (AMOXIL) 875 MG tablet Take 1 tablet (875 mg total) by mouth 2 (two) times daily. 20 tablet 0  . ARNUITY ELLIPTA 200 MCG/ACT AEPB TAKE 1 PUFF BY MOUTH EVERY DAY 30 each 2  . azelastine (ASTELIN) 0.1 % nasal spray PLACE 2 SPRAYS INTO BOTH NOSTRILS 2 (TWO) TIMES DAILY. USE IN EACH NOSTRIL AS DIRECTED 30 mL 2  . b complex vitamins capsule Take 1 capsule by mouth daily.    . benzonatate (TESSALON) 200 MG capsule TAKE 1 CAPSULE (200 MG TOTAL) BY MOUTH 2 (TWO) TIMES DAILY AS NEEDED FOR COUGH. 40 capsule 0  . doxycycline (VIBRA-TABS) 100 MG tablet Take 1 tablet (100 mg total) by mouth 2 (two) times daily. 1 po bid 20 tablet 0  . DULoxetine (CYMBALTA) 30 MG capsule Take 1 capsule (30 mg total) by mouth daily. 30 capsule 0  . fexofenadine (ALLEGRA) 180 MG tablet Take 180 mg by mouth daily.    [name] guaiFENesin (MUCINEX) 600 MG 12 hr tablet Take 600 mg by mouth 2 (two) times daily as needed.    . hyoscyamine (ANASPAZ) 0.125 MG TBDP disintergrating tablet Place 0.125 mg under the tongue.    . lactulose (CHRONULAC) 10 GM/15ML solution Take 45 mLs (30 g total) by mouth 2 (two) times daily as needed for mild constipation. 240 mL 0  . lidocaine (XYLOCAINE) 2 % jelly Apply  topically as needed. 30 mL 10  . LINZESS 290 MCG CAPS capsule TAKE 1 CAPSULE (290 MCG TOTAL) BY MOUTH DAILY. TO REGULATE BOWEL MOVEMENTS 30 capsule 4  . mometasone (NASONEX) 50 MCG/ACT nasal spray Place 2 sprays into the nose daily. 17 g 5  . pantoprazole (PROTONIX) 40 MG tablet Take 1 tablet (40 mg total) by mouth daily. 30 tablet 5  . phenylephrine (SUDAFED PE) 10 MG TABS tablet Take 10 mg by mouth every 4 (four) hours as needed.    . Polyethylene Glycol 3350 (MIRALAX PO) Take by mouth as needed.    . Probiotic Product (PROBIOTIC PO) Take by mouth daily.    . ranitidine (ZANTAC) 150 MG tablet Take 1 tablet (150 mg total) by mouth at bedtime. 60 tablet 5  . Simethicone (GAS-X PO) Take by mouth.    . tizanidine (ZANAFLEX) 2 MG capsule Take 1 capsule (2 mg total) by mouth 3 (three) times daily as needed for muscle spasms. 60 capsule 2  . traMADol (ULTRAM) 50 MG tablet Take 1 tablet (50 mg total) by mouth every 6 (six) hours as needed. 30 tablet 1  . UNABLE TO FIND Take 1 capsule by mouth daily. Dv3- vitamin D 3 plus immune support     No current facility-administered medications on file  prior to visit.    Allergies  Allergen Reactions  . Cefuroxime Axetil Other (See Comments)    Extreme gas  . Augmentin [Amoxicillin-Pot Clavulanate] Diarrhea  . Avelox [Moxifloxacin Hcl In Nacl] Other (See Comments)    Tremors   . Biaxin [Clarithromycin]     Unsure of reaction  . Cedax [Ceftibuten] Other (See Comments)    tremors  . Ciprofloxacin Other (See Comments)    Blurred vision  . Clindamycin/Lincomycin     tachycardia  . Gabapentin     Constipation  . Levofloxacin Other (See Comments)    Feels dehydrated  . Lyrica [Pregabalin]     Drowsiness, dry mouth  . Singulair [Montelukast Sodium] Other (See Comments)    Numbness and tingling in hand.  . Sulfonamide Derivatives Other (See Comments)    Gi upset

## 2017-08-18 NOTE — Telephone Encounter (Signed)
Called and spoke with patient, she states that she does not feel comfortable taking that medication due to being allergic to clarithomycin. She now as a flutter feeling in her left ear. Patient is going to PCP to get checked out. Nothing further needed.

## 2017-08-18 NOTE — Telephone Encounter (Signed)
Per Glenard Haring please send in rx for doxycycline 100mg  TID

## 2017-08-18 NOTE — Telephone Encounter (Signed)
She is sensitive to a lot of antibiotics, so we don't have much room for alternatives. Can she take a Zpak (250 mg, # 6, 2 today then one daily) /  If not, she should stick with the doxycycline.  If this is a viral infection, no antibiotic will help. If she has developed thrush from antibiotics, it could cause sore throat and she might want her PCP to look at her thraot again.

## 2017-08-18 NOTE — Progress Notes (Signed)
   HPI  Patient presents today with right ear fullness, trigeminal neuralgia, and postnasal drip.  Patient explains that she continues to have nasal drainage from frequent throat clearing. She has frontal headache. She did not cough as much while she was on amoxicillin and she was called in doxycycline last night by her on-call provider.  Patient states she has had right ear pressure today. She would like her ear cleaned out by nursing.  She would also like a refill of tramadol for trigeminal neuralgia.  It is effective and she uses it only as needed  PMH: Smoking status noted ROS: Per HPI  Objective: BP 112/72   Pulse 93   Temp 99.8 F (37.7 C) (Oral)   Ht 5\' 9"  (1.753 m)   Wt 111 lb 3.2 oz (50.4 kg)   SpO2 95%   BMI 16.42 kg/m  Gen: NAD, alert, cooperative with exam HEENT: NCAT, right TM obscured by cerumen,, no tenderness to palpation of the bilateral maxillary or frontal sinuses CV: RRR, good S1/S2, no murmur Resp: CTABL, no wheezes, non-labored Ext: No edema, warm Neuro: Alert and oriented, No gross deficits  Assessment and plan:  #Postnasal drip Trial of Zyrtec instead of Allegra Patient has an appointment with allergist referred by neurology  #Impacted cerumen of the right ear Irrigated by nursing tonight,then removed with curettage, TM WNL below   #Trigeminal neuralgia Patient uses tramadol most days, it is effective for pain, refilled   Meds ordered this encounter  Medications  . traMADol (ULTRAM) 50 MG tablet    Sig: Take 1 tablet (50 mg total) by mouth every 6 (six) hours as needed.    Dispense:  30 tablet    Refill:  1    Not to exceed 5 additional fills before 02/07/2016.    Laroy Apple, MD [facility] 08/18/2017, 5:09 PM

## 2017-08-18 NOTE — Patient Instructions (Signed)
Great to see you!  Try debrox drops in your R ear to soften the wax  Try zyrtec instead of allegra

## 2017-08-25 ENCOUNTER — Telehealth: Payer: Self-pay | Admitting: *Deleted

## 2017-08-25 NOTE — Telephone Encounter (Signed)
Summary of benefits received.

## 2017-08-25 NOTE — Telephone Encounter (Signed)
Prior authorization completed through Cover My Meds and faxed to [facility]

## 2017-08-29 ENCOUNTER — Encounter: Payer: Self-pay | Admitting: Physical Therapy

## 2017-08-29 ENCOUNTER — Ambulatory Visit: Payer: Medicare HMO | Attending: Neurology | Admitting: Physical Therapy

## 2017-08-29 ENCOUNTER — Other Ambulatory Visit: Payer: Self-pay

## 2017-08-29 DIAGNOSIS — M542 Cervicalgia: Secondary | ICD-10-CM

## 2017-08-29 DIAGNOSIS — R293 Abnormal posture: Secondary | ICD-10-CM | POA: Insufficient documentation

## 2017-08-29 NOTE — Patient Instructions (Signed)
[facility]  DRY NEEDLING CONSENT FORM   Trigger point dry needling is a physical therapy approach to treat Myofascial Pain and Dysfunction.  Dry Needling (DN) is a valuable and effective way to deactivate myofascial trigger points (muscle knots/pain). It is skilled intervention that uses a thin filiform needle to penetrate the skin and stimulate underlying myofascial trigger points, muscular, and connective tissues for the management of neuromusculoskeletal pain and movement impairments.  A local twitch response (LTR) will be elicited.  This can sometimes feel like a deep ache in the muscle during the procedure. Multiple trigger points in multiple muscles can be treated during each treatment.  No medication of any kind is injected.   As with any medical treatment and procedure, there are possible adverse events.  While significant adverse events are uncommon, they do sometimes occur and must be considered prior to giving consent.  1. Dry needling often causes a "post needling soreness".  There can be an increase in pain from a couple of hours to 2-3 days, followed by an improvement in the overall pain state. 2. Any time a needle is used there is a risk of infection.  However, we are using new, sterile, and disposable needles; infections are extremely rare. 3. There is a possibility that you may bleed or bruise.  You may feel tired and some nausea following treatment. 4. There is a rare possibility of a pneumothorax (air in the chest cavity). 5. Allergic reaction to nickel in the stainless steel needle. 6. If a nerve is touched, it may cause paresthesia (a prickling/shock sensation) which is usually brief, but may continue for a couple of days.  Following treatment stay hydrated.  Continue regular activities but not too vigorous initially after treatment for 24-48 hours.  Dry Needling is best when combined with other physical therapy interventions such as  strengthening, stretching and other therapeutic modalities.   PLEASE ANSWER THE FOLLOWING QUESTIONS:  Do you have a lack of sensation?   Y/N  Do you have a phobia or fear of needles  Y/N  Are you pregnant?    Y/N If yes:  How many weeks? __________ Do you have any implanted devices?  Y/N If yes:  Pacemaker/Spinal Cord Stimulator/Deep Brain Stimulator/Insulin Pump/Other: ________________ Do you have any implants?  Y/N If yes: Breast/Facial/Pecs/Buttocks/Calves/Hip  Replacement/ Knee Replacement/Other: _________ Do you take any blood thinners?   Y/N If yes: Coumadin (Warfarin)/Other: ___________________ Do you have a bleeding disorder?   Y/N If yes: What kind: _________________________________ Do you take any immunosuppressants?  Y/N If yes:   What kind: _________________________________ Do you take anti-inflammatories?   Y/N If yes: What kind: Advil/Aspirin/Other: ________________ Have you ever been diagnosed with Scoliosis? Y/N Have you had back surgery?   Y/N If yes:  Laminectomy/Fusion/Other: ___________________   I have read, or had read to me, the above.  I have had the opportunity to ask any questions.  All of my questions have been answered to my satisfaction and I understand the risks involved with dry needling.  I consent to examination and treatment at [facility], including dry needling, of any and all of my involved and affected muscles.  

## 2017-08-29 NOTE — Therapy (Signed)
[facility] [place], [place], [address] Phone: [phone]   Fax:  [phone]  Physical Therapy Evaluation  Patient Details  Name: Kristy Garza MRN: [id number] Date of Birth: 02/04/1951 Referring Provider: Tommas Olp   Encounter Date: 08/29/2017  PT End of Session - 08/29/17 1159    Visit Number  1    Number of Visits  12    Date for PT Re-Evaluation  10/10/17    Authorization Type  10TH VISIT PROGRESS NOTE AND KX MODIFIER AFTER THE 15 VISIT.    PT Start Time  1120    PT Stop Time  1205    PT Time Calculation (min)  45 min       Past Medical History:  Diagnosis Date  . Acid reflux   . Allergy   . Arthritis    ARTHRITIS IN NECK BY DR. Alroy Dust [facility]  . Asthma   . Interstitial cystitis   . Osteoporosis   . PONV (postoperative nausea and vomiting)   . Tinnitus   . Trigeminal neuralgia    Atypical trigeminal neuralgia    Past Surgical History:  Procedure Laterality Date  . ABDOMINAL HYSTERECTOMY  2000  . CYSTOSCOPY WITH HYDRODISTENSION AND BIOPSY N/A 06/11/2012   Procedure: CYSTOSCOPY/BIOPSY/HYDRODISTENSION with Instillation of Pyridium and Marcaine and Kenalog;  Surgeon: Ailene Rud, MD;  Location: [facility];  Service: Urology;  Laterality: N/A;  1 hour requested for this case  BLADDER BIOPSY  . ETHMOIDECTOMY  2012  . SEPTOPLASTY  1980's  . vocal cord surgery   1990's   polyp removal    There were no vitals filed for this visit.   Subjective Assessment - 08/29/17 1237    Subjective  The patient has had neck pain for over 2 years and a h/o TMJ.  She has been seeing a chiropractor but states it is not helping and is not going back.  Her pain today is an 8/10.  She staes that CBD ointment decreases her pain and eating/chewing increases her pain.  The patient states she has about 3 headaches per week.    Pertinent History  OP; TMJ; COPD.    Patient Stated Goals  Decrease my  pain so I'm in a better mood.    Currently in Pain?  Yes    Pain Score  8     Pain Orientation  Right;Left    Pain Descriptors / Indicators  Aching;Sharp    Pain Type  Chronic pain    Pain Onset  More than a month ago    Pain Frequency  Constant    Aggravating Factors   See above.    Pain Relieving Factors  See above.         [facility] PT Assessment - 08/29/17 0001      Assessment   Medical Diagnosis  Neck pain.    Referring Provider  Tommas Olp    Onset Date/Surgical Date  -- 2017.      Precautions   Precautions  -- OP; TMJ.      Restrictions   Weight Bearing Restrictions  No      Balance Screen   Has the patient fallen in the past 6 months  No    Has the patient had a decrease in activity level because of a fear of falling?   Yes    Is the patient reluctant to leave their home because of a fear of falling?   No  [place] residence      Prior Function   Level of Independence  Independent      Posture/Postural Control   Posture/Postural Control  Postural limitations    Postural Limitations  Rounded Shoulders;Forward head    Posture Comments  Mid-cervical hyperlordosis.      ROM / Strength   AROM / PROM / Strength  AROM;Strength      AROM   Overall AROM Comments  Bilateral active cervical rotation= 60 degrees and bilateral SBing= 15 degrees.      Strength   Overall Strength Comments  Bilateral UE strength= 5/5.      Palpation   Palpation comment  Diffuse bilateral suboccipital and cervical paraspinal muscule tenderness with trigger points.  Tender to palpation over bilateral UT's as well.      Special Tests   Other special tests  Decreased left Biceps reflex when contralaterally compared.      Ambulation/Gait   Gait Comments  WNL.                Objective measurements completed on examination: See above findings.      [facility] Adult PT Treatment/Exercise - 08/29/17 0001      Modalities   Modalities   Electrical Stimulation      Electrical Stimulation   Electrical Stimulation Location  Bilateral cervical region.    Electrical Stimulation Action  Pre-mod to both sides of patient's neck.    Electrical Stimulation Parameters  80-150 Hz x 15 minutes.    Electrical Stimulation Goals  Pain                  PT Long Term Goals - 08/29/17 1256      PT LONG TERM GOAL #1   Title  be independent in advanced HEP    Time  6    Period  Weeks    Status  New      PT LONG TERM GOAL #2   Title  No more than one headache per week.    Time  6    Period  Weeks    Status  New      PT LONG TERM GOAL #3   Title  Increase active cervical rotation to 70 degrees+ so patient can turn head more easily while driving.    Time  6    Period  Weeks    Status  New      PT LONG TERM GOAL #4   Title  Perform ADL's with pain not > 2-3/10.    Time  6    Period  Weeks    Status  New             Plan - 08/29/17 1250    Clinical Impression Statement  The patient presents to OPPT with chronic neck pain.  She has a loss of cervical AROM and diffuse palpable tenderness over bilateral cervical paraspinal musculature.  She has a decreased left Biceps reflex.  She also has frequent headaches.  The patient will benefit from skilled physical therapy intervention.      History and Personal Factors relevant to plan of care:  OP; TMJ.    Clinical Presentation  Evolving    Clinical Presentation due to:  Not improving.    Clinical Decision Making  Low    Rehab Potential  Good    PT Frequency  2x / week    PT Duration  6 weeks  PT Treatment/Interventions  ADLs/Self Care Home Management;Cryotherapy;Electrical Stimulation;Moist Heat;Therapeutic activities;Therapeutic exercise;Patient/family education;Manual techniques;Passive range of motion    PT Next Visit Plan  STW/M including suboccipital release technique; chin tucks and cervical extension; corner stretch and scapular retraction; combo e'stim/U/S;  dry needling.    Consulted and Agree with Plan of Care  Patient       Patient will benefit from skilled therapeutic intervention in order to improve the following deficits and impairments:  Decreased activity tolerance, Decreased range of motion, Postural dysfunction, Pain  Visit Diagnosis: Cervicalgia - Plan: PT plan of care cert/re-cert  Abnormal posture - Plan: PT plan of care cert/re-cert     Problem List Patient Active Problem List   Diagnosis Date Noted  . Anxiety state 01/19/2016  . Adjustment disorder with anxious mood 01/19/2016  . HLD (hyperlipidemia) 01/07/2016  . IBS (irritable bowel syndrome) 07/11/2015  . Functional constipation 06/22/2015  . Chronic bronchitis ([place]) 05/07/2015  . 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 Trigeminal neuralgia 04/17/2014  . Face pain 01/22/2014  . 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 Asthma, chronic 05/13/2013  . Generalized anxiety disorder 05/13/2013  . Difficulty speaking 04/11/2013  . 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 Leg cramps 03/23/2013  . Acid reflux 03/11/2013  . Osteoporosis with pathological fracture 01/30/2013  . Abdominal pain 01/16/2013  . Back ache 01/16/2013  . Chronic interstitial cystitis 01/16/2013  . FOM (frequency of micturition) 01/16/2013  . Seasonal allergic rhinitis 07/07/2012  . G E R D 08/15/2007  . VOCAL CORD POLYP, HX OF 08/15/2007    Jhair Witherington, [place] MPT 08/29/2017, 1:53 PM  [facility] [address] [place], [place], [address] Phone: [phone]   Fax:  [phone]  Name: Kristy Garza MRN: [id number] Date of Birth: Sep 26, 1950

## 2017-08-30 NOTE — Addendum Note (Signed)
Addended by: APPLEGATE, [place] W on: 08/30/2017 02:37 PM   Modules accepted: Orders

## 2017-08-30 NOTE — Addendum Note (Signed)
Addended by: Issacc Merlo, [place] W on: 08/30/2017 10:02 AM   Modules accepted: Orders

## 2017-08-30 NOTE — Addendum Note (Signed)
Addended by: APPLEGATE, [place] W on: 08/30/2017 02:28 PM   Modules accepted: Orders

## 2017-08-31 NOTE — Addendum Note (Signed)
Addended by: APPLEGATE, [place] W on: 08/31/2017 12:31 PM   Modules accepted: Orders

## 2017-09-05 ENCOUNTER — Ambulatory Visit: Payer: Medicare HMO | Admitting: Physical Therapy

## 2017-09-05 ENCOUNTER — Encounter: Payer: Self-pay | Admitting: Physical Therapy

## 2017-09-05 DIAGNOSIS — R293 Abnormal posture: Secondary | ICD-10-CM | POA: Diagnosis not present

## 2017-09-05 DIAGNOSIS — M542 Cervicalgia: Secondary | ICD-10-CM

## 2017-09-05 NOTE — Therapy (Signed)
[facility] [place], [place], [address] Phone: [phone]   Fax:  [phone]  Physical Therapy Treatment  Patient Details  Name: Kristy Garza MRN: [id number] Date of Birth: 1950/08/09 Referring Provider: Tommas Olp   Encounter Date: 09/05/2017  PT End of Session - 09/05/17 1118    Visit Number  2    Number of Visits  12    Date for PT Re-Evaluation  10/10/17    Authorization Type  10TH VISIT PROGRESS NOTE AND KX MODIFIER AFTER THE 15 VISIT.    PT Start Time  1118    PT Stop Time  1202    PT Time Calculation (min)  44 min    Activity Tolerance  Patient tolerated treatment well    Behavior During Therapy  WFL for tasks assessed/performed       Past Medical History:  Diagnosis Date  . Acid reflux   . Allergy   . Arthritis    ARTHRITIS IN NECK BY DR. Alroy Dust [facility]  . Asthma   . Interstitial cystitis   . Osteoporosis   . PONV (postoperative nausea and vomiting)   . Tinnitus   . Trigeminal neuralgia    Atypical trigeminal neuralgia    Past Surgical History:  Procedure Laterality Date  . ABDOMINAL HYSTERECTOMY  2000  . CYSTOSCOPY WITH HYDRODISTENSION AND BIOPSY N/A 06/11/2012   Procedure: CYSTOSCOPY/BIOPSY/HYDRODISTENSION with Instillation of Pyridium and Marcaine and Kenalog;  Surgeon: Ailene Rud, MD;  Location: [facility];  Service: Urology;  Laterality: N/A;  1 hour requested for this case  BLADDER BIOPSY  . ETHMOIDECTOMY  2012  . SEPTOPLASTY  1980's  . vocal cord surgery   1990's   polyp removal    There were no vitals filed for this visit.  Subjective Assessment - 09/05/17 1116    Subjective  Reports that she has some pain in her neck today. Patient reported having to keep her small grandchildren some this week and will have to next week as well. Patient brought in signed consent form for DN as well.    Pertinent History  OP; TMJ; COPD.    Patient Stated Goals   Decrease my pain so I'm in a better mood.    Currently in Pain?  Yes    Pain Score  4     Pain Location  Neck    Pain Orientation  Right;Left    Pain Descriptors / Indicators  Dull    Pain Type  Chronic pain    Pain Onset  More than a month ago    Pain Frequency  Constant         [facility] PT Assessment - 09/05/17 0001      Assessment   Medical Diagnosis  Neck pain.      Restrictions   Weight Bearing Restrictions  No                   [facility] Adult PT Treatment/Exercise - 09/05/17 0001      Modalities   Modalities  Electrical Stimulation;Moist Heat;Ultrasound      Moist Heat Therapy   Number Minutes Moist Heat  15 Minutes    Moist Heat Location  Cervical      Electrical Stimulation   Electrical Stimulation Location  B UT    Electrical Stimulation Action  IFC    Electrical Stimulation Parameters  80-150 hz x15 min    Electrical Stimulation Goals  Pain  Ultrasound   Ultrasound Location  B UT     Ultrasound Parameters  1.2 w/cm2, 100%, 1 mhz x10 min    Ultrasound Goals  Pain      Manual Therapy   Manual Therapy  Soft tissue mobilization    Soft tissue mobilization  B UT, cervical paraspinals to reduce muslce tightness and pain                  PT Long Term Goals - 08/29/17 1256      PT LONG TERM GOAL #1   Title  be independent in advanced HEP    Time  6    Period  Weeks    Status  New      PT LONG TERM GOAL #2   Title  No more than one headache per week.    Time  6    Period  Weeks    Status  New      PT LONG TERM GOAL #3   Title  Increase active cervical rotation to 70 degrees+ so patient can turn head more easily while driving.    Time  6    Period  Weeks    Status  New      PT LONG TERM GOAL #4   Title  Perform ADL's with pain not > 2-3/10.    Time  6    Period  Weeks    Status  New            Plan - 09/05/17 1214    Clinical Impression Statement  Patient tolerated today's treatment well although she arrived with  continued pain. Palpable muscle tightness noted in B cervical paraspinals and UT region. No complaints during treatment today of pain or tenderness. Patient educated regarding proper positioning with pillows for sleeping with demo as well as VCs. Normal modalities response noted following removal of the modalites.    Rehab Potential  Good    PT Frequency  2x / week    PT Duration  6 weeks    PT Treatment/Interventions  ADLs/Self Care Home Management;Cryotherapy;Electrical Stimulation;Moist Heat;Therapeutic activities;Therapeutic exercise;Patient/family education;Manual techniques;Passive range of motion    PT Next Visit Plan  STW/M including suboccipital release technique; chin tucks and cervical extension; corner stretch and scapular retraction; combo e'stim/U/S; dry needling.    Consulted and Agree with Plan of Care  Patient       Patient will benefit from skilled therapeutic intervention in order to improve the following deficits and impairments:  Decreased activity tolerance, Decreased range of motion, Postural dysfunction, Pain  Visit Diagnosis: Cervicalgia  Abnormal posture     Problem List Patient Active Problem List   Diagnosis Date Noted  . Anxiety state 01/19/2016  . Adjustment disorder with anxious mood 01/19/2016  . HLD (hyperlipidemia) 01/07/2016  . IBS (irritable bowel syndrome) 07/11/2015  . Functional constipation 06/22/2015  . Chronic bronchitis ([place]) 05/07/2015  . 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 Trigeminal neuralgia 04/17/2014  . Face pain 01/22/2014  . 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 Asthma, chronic 05/13/2013  . Generalized anxiety disorder 05/13/2013  . Difficulty speaking 04/11/2013  . 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 Leg cramps 03/23/2013  . Acid reflux 03/11/2013  . Osteoporosis with pathological fracture 01/30/2013  . Abdominal pain 01/16/2013  . Back ache 01/16/2013  . Chronic interstitial cystitis 01/16/2013  . FOM (frequency of  micturition) 01/16/2013  . Seasonal allergic rhinitis 07/07/2012  . G E R D 08/15/2007  . VOCAL  CORD POLYP, HX OF 08/15/2007    Standley Brooking, PTA 09/05/2017, 12:18 PM  [facility] [address] [place], [place], [address] Phone: [phone]   Fax:  [phone]  Name: AMBERA FEDELE MRN: [id number] Date of Birth: 1951-02-17

## 2017-09-05 NOTE — Addendum Note (Signed)
Addended by: APPLEGATE, [place] W on: 09/05/2017 10:20 AM   Modules accepted: Orders

## 2017-09-06 DIAGNOSIS — R69 Illness, unspecified: Secondary | ICD-10-CM | POA: Diagnosis not present

## 2017-09-07 ENCOUNTER — Encounter: Payer: Self-pay | Admitting: *Deleted

## 2017-09-07 ENCOUNTER — Ambulatory Visit (INDEPENDENT_AMBULATORY_CARE_PROVIDER_SITE_OTHER): Payer: Medicare HMO | Admitting: *Deleted

## 2017-09-07 VITALS — BP 101/64 | HR 75 | Ht 67.0 in | Wt 116.0 lb

## 2017-09-07 DIAGNOSIS — Z23 Encounter for immunization: Secondary | ICD-10-CM | POA: Diagnosis not present

## 2017-09-07 DIAGNOSIS — Z0000 Encounter for general adult medical examination without abnormal findings: Secondary | ICD-10-CM

## 2017-09-07 NOTE — Progress Notes (Addendum)
Subjective:   Kristy Garza is a 67 y.o. female who presents for a Medicare Annual Wellness Visit. Kristy Garza lives at home with her husband. She has 3 children and 4 grandchildren.    Review of Systems    Patient reports that her health is unchanged compared to last year.  Cardiac Risk Factors include: advanced age (>54men, >62 women);sedentary lifestyle  Musc: Head,neck, and jaw pain. Having PT now  Other systems negative       Current Medications (verified) Outpatient Encounter Medications as of 09/07/2017  Medication Sig  . acetaminophen (TYLENOL) 500 MG tablet Take by mouth.  [name] albuterol (PROVENTIL HFA;VENTOLIN HFA) 108 (90 Base) MCG/ACT inhaler Inhale two puffs every four to six hours as needed for cough or wheeze.  [name] ARNUITY ELLIPTA 200 MCG/ACT AEPB TAKE 1 PUFF BY MOUTH EVERY DAY  . azelastine (ASTELIN) 0.1 % nasal spray PLACE 2 SPRAYS INTO BOTH NOSTRILS 2 (TWO) TIMES DAILY. USE IN EACH NOSTRIL AS DIRECTED  . b complex vitamins capsule Take 1 capsule by mouth daily.  . benzonatate (TESSALON) 200 MG capsule TAKE 1 CAPSULE (200 MG TOTAL) BY MOUTH 2 (TWO) TIMES DAILY AS NEEDED FOR COUGH.  [name] dextromethorphan-guaiFENesin (MUCINEX DM) 30-600 MG 12hr tablet Take 1 tablet by mouth 2 (two) times daily.  . DULoxetine (CYMBALTA) 30 MG capsule Take 1 capsule (30 mg total) by mouth daily.  . fexofenadine (ALLEGRA) 180 MG tablet Take 180 mg by mouth daily.  [name] guaiFENesin (MUCINEX) 600 MG 12 hr tablet Take 600 mg by mouth 2 (two) times daily as needed.  . hyoscyamine (ANASPAZ) 0.125 MG TBDP disintergrating tablet Place 0.125 mg under the tongue.  . lactulose (CHRONULAC) 10 GM/15ML solution Take 45 mLs (30 g total) by mouth 2 (two) times daily as needed for mild constipation.  . lidocaine (XYLOCAINE) 2 % jelly Apply topically as needed.  [name] LINZESS 290 MCG CAPS capsule TAKE 1 CAPSULE (290 MCG TOTAL) BY MOUTH DAILY. TO REGULATE BOWEL MOVEMENTS  . mometasone (NASONEX) 50 MCG/ACT nasal spray  Place 2 sprays into the nose daily.  . pantoprazole (PROTONIX) 40 MG tablet Take 1 tablet (40 mg total) by mouth daily.  . phenylephrine (SUDAFED PE) 10 MG TABS tablet Take 10 mg by mouth every 4 (four) hours as needed.  . Polyethylene Glycol 3350 (MIRALAX PO) Take by mouth as needed.  . Probiotic Product (PROBIOTIC PO) Take by mouth daily.  . ranitidine (ZANTAC) 150 MG tablet Take 1 tablet (150 mg total) by mouth at bedtime.  . Simethicone (GAS-X PO) Take by mouth.  . tizanidine (ZANAFLEX) 2 MG capsule Take 1 capsule (2 mg total) by mouth 3 (three) times daily as needed for muscle spasms.  . traMADol (ULTRAM) 50 MG tablet Take 1 tablet (50 mg total) by mouth every 6 (six) hours as needed.  [name] UNABLE TO FIND Take 1 capsule by mouth daily. Dv3- vitamin D 3 plus immune support  . doxycycline (VIBRA-TABS) 100 MG tablet Take 1 tablet (100 mg total) by mouth 2 (two) times daily. 1 po bid   No facility-administered encounter medications on file as of 09/07/2017.     Allergies (verified) Cefuroxime axetil; Augmentin [amoxicillin-pot clavulanate]; Avelox [moxifloxacin hcl in nacl]; Biaxin [clarithromycin]; Cedax [ceftibuten]; Ciprofloxacin; Clindamycin/lincomycin; Gabapentin; Levofloxacin; Lyrica [pregabalin]; Singulair [montelukast sodium]; and Sulfonamide derivatives   History: Past Medical History:  Diagnosis Date  . Acid reflux   . Allergy   . Arthritis    ARTHRITIS IN NECK BY DR. Alroy Dust  [facility]  . Asthma   . Interstitial cystitis   . Osteoporosis   . PONV (postoperative nausea and vomiting)   . Tinnitus   . Trigeminal neuralgia    Atypical trigeminal neuralgia   Past Surgical History:  Procedure Laterality Date  . ABDOMINAL HYSTERECTOMY  2000  . CYSTOSCOPY WITH HYDRODISTENSION AND BIOPSY N/A 06/11/2012   Procedure: CYSTOSCOPY/BIOPSY/HYDRODISTENSION with Instillation of Pyridium and Marcaine and Kenalog;  Surgeon: Ailene Rud, MD;  Location: [facility];   Service: Urology;  Laterality: N/A;  1 hour requested for this case  BLADDER BIOPSY  . ETHMOIDECTOMY  2012  . SEPTOPLASTY  1980's  . vocal cord surgery   1990's   polyp removal   Family History  Problem Relation Age of Onset  . Hyperlipidemia Mother   . Arthritis Mother   . Cancer Mother   . Lymphoma Mother   . Cancer Father   . Allergies Father   . Allergies Sister   . Allergies Sister   . Allergic rhinitis Neg Hx   . Angioedema Neg Hx   . Asthma Neg Hx   . Eczema Neg Hx   . Immunodeficiency Neg Hx   . Urticaria Neg Hx    Social History   Socioeconomic History  . Marital status: Married    Spouse name: Not on file  . Number of children: 3  . Years of education: 65  . Highest education level: Some college, no degree  Occupational History  . Occupation: CNA    Comment: Retired  Scientific laboratory technician  . Financial resource strain: Not on file  . Food insecurity:    Worry: Never true    Inability: Never true  . Transportation needs:    Medical: No    Non-medical: No  Tobacco Use  . Smoking status: Never Smoker  . Smokeless tobacco: Never Used  Substance and Sexual Activity  . Alcohol use: No    Alcohol/week: 0.0 oz    Comment: 01-19-2016 per pt no  . Drug use: No    Comment: 01-19-2016 per pt no   . Sexual activity: Never  Lifestyle  . Physical activity:    Days per week: 0 days    Minutes per session: 0 min  . Stress: Not at all  Relationships  . Social connections:    Talks on phone: More than three times a week    Gets together: More than three times a week    Attends religious service: Never    Active member of club or organization: No    Attends meetings of clubs or organizations: Never    Relationship status: Married  Other Topics Concern  . Not on file  Social History Narrative   Lives with husband.     Tobacco Use No.  Clinical Intake:     Pain : 0-10 Pain Score: 4  Pain Type: Chronic pain Pain Location: (jaw, neck, and head) Pain  Orientation: Left, Right Pain Descriptors / Indicators: Dull Pain Onset: Today Pain Frequency: Constant     Nutritional Status: BMI <19  Underweight Diabetes: No  How often do you need to have someone help you when you read instructions, pamphlets, or other written materials from your doctor or pharmacy?: 1 - Never What is the last grade level you completed in school?: 12th     Information entered by :: Chong Sicilian, RN   Activities of Daily Living In your present state of health, do you have any difficulty performing the following  activities: 09/07/2017  Hearing? N  Vision? N  Difficulty concentrating or making decisions? N  Walking or climbing stairs? N  Dressing or bathing? N  Doing errands, shopping? N  Preparing Food and eating ? N  Using the Toilet? N  In the past six months, have you accidently leaked urine? N  Do you have problems with loss of bowel control? N  Managing your Medications? N  Managing your Finances? N  Housekeeping or managing your Housekeeping? N  Some recent data might be hidden     Immunizations and Health Maintenance Immunization History  Administered Date(s) Administered  . Influenza,inj,Quad PF,6+ Mos 12/28/2012, 12/25/2013, 12/26/2014  . Pneumococcal Conjugate-13 02/07/2013  . Pneumococcal Polysaccharide-23 09/07/2017  . Tdap 05/21/2010  . Zoster 03/27/2012   There are no preventive care reminders to display for this patient.  Diet Drinks a lot of water during the day Eats 3 meals a day. Mostly home prepared meals. Eats out 2 or 3 times a week  Exercise Current Exercise Habits: The patient does not participate in regular exercise at present, Exercise limited by: None identified   Depression Screen PHQ 2/9 Scores 09/07/2017 08/18/2017 08/08/2017 06/12/2017 06/08/2017 06/02/2017 03/03/2017  PHQ - 2 Score 0 0 0 0 0 0 0  PHQ- 9 Score - - - - - - -     Fall Risk Fall Risk  09/07/2017 08/18/2017 08/08/2017 06/12/2017 06/08/2017  Falls in the  past year? No No No No No    Safety Is the patient's home free of loose throw rugs in walkways, pet beds, electrical cords, etc?   yes      Grab bars in the bathroom? no      Walkin shower? no      Shower Seat? no      Handrails on the stairs?   yes      Adequate lighting?   yes  Patient Care Team: Timmothy Euler, MD as PCP - General (Family Medicine) Deneise Lever, MD as Consulting Physician (Pulmonary Disease) Minus Breeding, MD as Consulting Physician (Cardiology)   No hospitalizations, ER visits, or surgeries this past year.   Objective:    Today's Vitals   09/07/17 1427 09/07/17 1428  BP: 101/64   Pulse: 75   Weight: 116 lb (52.6 kg)   Height: 5\' 7"  (1.702 m)   PainSc:  4    Body mass index is 18.17 kg/m.  Advanced Directives 09/07/2017 08/29/2017 07/28/2015 02/24/2015 07/11/2014 05/07/2014 06/11/2012  Does Patient Have a Medical Advance Directive? No No No No No No Patient does not have advance directive;Patient would like information  Would patient like information on creating a medical advance directive? Yes (MAU/Ambulatory/Procedural Areas - Information given) - No - patient declined information Yes - Educational materials given Yes - Scientist, clinical (histocompatibility and immunogenetics) given No - patient declined information Advance directive packet given  Pre-existing out of facility DNR order (yellow form or pink MOST form) - - - - - - No    Hearing/Vision  normal or No deficits noted during visit.  Cognitive Function: MMSE - Mini Mental State Exam 09/07/2017 07/27/2015 07/11/2014  Orientation to time 5 5 5   Orientation to Place 5 5 5   Registration 3 3 3   Attention/ Calculation 5 4 5   Recall 3 3 2   Language- name 2 objects 2 2 2   Language- repeat 1 1 1   Language- follow 3 step command 3 3 3   Language- read & follow direction 1 1 1   Write a sentence  1 1 1   Copy design 1 1 1   Total score 30 29 29        Normal Cognitive Function Screening: Yes      Assessment:   This is a routine  wellness examination for Kristy Garza.    Plan:    Goals    . Exercise 150 min/wk Moderate Activity       Keep f/u with Timmothy Euler, MD and any other specialty appointments you may have Continue current medications Move carefully to avoid falls. Use assistive devices like a can or walker if needed. Aim for at least 150 minutes of moderate activity a week. This can be done with chair exercises if necessary. Read or work on puzzles daily Stay connected with friends and family  Health Maintenance: Tdap Vaccine recommended: no Zostavax (Shingles vaccine) recommended:Up to patient. She has had zostavax Prevnar or Pneumovax (pneumonia vaccines) recommended:no  Cancer Screenings: Lung: Low Dose CT Chest recommended if Age 37-80 years, 30 pack-year currently smoking OR have quit w/in 15years. Patient does not qualify. Colon cancer screening recommended: no Mammogram recommended:no Pap Smear recommended: no  Additional Screenings Hepatitis C Screening recommended: no Dexa Scan recommended: no Diabetic Eye Exam recommended: no  Orders Placed This Encounter  Procedures  . Pneumococcal polysaccharide vaccine 23-valent greater than or equal to 2yo subcutaneous/IM  Pneumovax given today   Considering Prolia. It has been approved by her insurance. May want to see Dr Layne Benton to further discuss since she has some problems with jaw pain already. Will provide Prolia information and [employer] for financial assistance. Dexa showed T score of -4.8. Encouraged Vit D and calcium intake. She will review information on Prolia and let us know what she wants to do.   I have personally reviewed and noted the following in the patient's chart:   . Medical and social history . Use of alcohol, tobacco or illicit drugs  . Current medications and supplements . Functional ability and status . Nutritional status . Physical activity . Advanced directives . List of other  physicians . Hospitalizations, surgeries, and ER visits in previous 12 months . Vitals . Screnings to include cognitive, depression, and falls . Referrals and appointments  In addition, I have reviewed and discussed with patient certain preventive protocols, quality metrics, and best practice recommendations. A written personalized care plan for preventive services as well as general preventive ehealth recommendations were provided to patient.     Chong Sicilian, RN   09/08/2017     I have reviewed and agree with the above AWV documentation.   Laroy Apple, MD [facility] 09/08/2017, 5:12 PM

## 2017-09-07 NOTE — Patient Instructions (Addendum)
Kristy Garza , Thank you for taking time to come for your Medicare Wellness Visit. I appreciate your ongoing commitment to your health goals. Please review the following plan we discussed and let me know if I can assist you in the future.   These are the goals we discussed: Goals    . Exercise 150 min/wk Moderate Activity       This is a list of the screening recommended for you and due dates:  Health Maintenance  Topic Date Due  . Pneumonia vaccines (2 of 2 - PPSV23) 11/19/2015  . Flu Shot  02/02/2018*  . Mammogram  12/21/2018  . DEXA scan (bone density measurement)  08/09/2019  . Tetanus Vaccine  05/21/2020  . Colon Cancer Screening  07/18/2022  .  Hepatitis C: One time screening is recommended by Center for Disease Control  (CDC) for  adults born from [birth year] through 1965.   Completed  *Topic was postponed. The date shown is not the original due date.    Take Vitamin D and calcium daily   Check https://www.healthwellfoundation.org/ to fill out information for help in paying for Prolia  Denosumab injection What is this medicine? DENOSUMAB (den oh sue mab) slows bone breakdown. Prolia is used to treat osteoporosis in women after menopause and in men. Delton See is used to treat a high calcium level due to cancer and to prevent bone fractures and other bone problems caused by multiple myeloma or cancer bone metastases. Delton See is also used to treat giant cell tumor of the bone. This medicine may be used for other purposes; ask your health care provider or pharmacist if you have questions. COMMON BRAND NAME(S): Prolia, XGEVA What should I tell my health care provider before I take this medicine? They need to know if you have any of these conditions: -dental disease -having surgery or tooth extraction -infection -kidney disease -low levels of calcium or Vitamin D in the blood -malnutrition -on hemodialysis -skin conditions or sensitivity -thyroid or parathyroid disease -an unusual  reaction to denosumab, other medicines, foods, dyes, or preservatives -pregnant or trying to get pregnant -breast-feeding How should I use this medicine? This medicine is for injection under the skin. It is given by a health care professional in a hospital or clinic setting. If you are getting Prolia, a special MedGuide will be given to you by the pharmacist with each prescription and refill. Be sure to read this information carefully each time. For Prolia, talk to your pediatrician regarding the use of this medicine in children. Special care may be needed. For Delton See, talk to your pediatrician regarding the use of this medicine in children. While this drug may be prescribed for children as young as 13 years for selected conditions, precautions do apply. Overdosage: If you think you have taken too much of this medicine contact a poison control center or emergency room at once. NOTE: This medicine is only for you. Do not share this medicine with others. What if I miss a dose? It is important not to miss your dose. Call your doctor or health care professional if you are unable to keep an appointment. What may interact with this medicine? Do not take this medicine with any of the following medications: -other medicines containing denosumab This medicine may also interact with the following medications: -medicines that lower your chance of fighting infection -steroid medicines like prednisone or cortisone This list may not describe all possible interactions. Give your health care provider a list of all the medicines, herbs,  non-prescription drugs, or dietary supplements you use. Also tell them if you smoke, drink alcohol, or use illegal drugs. Some items may interact with your medicine. What should I watch for while using this medicine? Visit your doctor or health care professional for regular checks on your progress. Your doctor or health care professional may order blood tests and other tests to see  how you are doing. Call your doctor or health care professional for advice if you get a fever, chills or sore throat, or other symptoms of a cold or flu. Do not treat yourself. This drug may decrease your body's ability to fight infection. Try to avoid being around people who are sick. You should make sure you get enough calcium and vitamin D while you are taking this medicine, unless your doctor tells you not to. Discuss the foods you eat and the vitamins you take with your health care professional. See your dentist regularly. Brush and floss your teeth as directed. Before you have any dental work done, tell your dentist you are receiving this medicine. Do not become pregnant while taking this medicine or for 5 months after stopping it. Talk with your doctor or health care professional about your birth control options while taking this medicine. Women should inform their doctor if they wish to become pregnant or think they might be pregnant. There is a potential for serious side effects to an unborn child. Talk to your health care professional or pharmacist for more information. What side effects may I notice from receiving this medicine? Side effects that you should report to your doctor or health care professional as soon as possible: -allergic reactions like skin rash, itching or hives, swelling of the face, lips, or tongue -bone pain -breathing problems -dizziness -jaw pain, especially after dental work -redness, blistering, peeling of the skin -signs and symptoms of infection like fever or chills; cough; sore throat; pain or trouble passing urine -signs of low calcium like fast heartbeat, muscle cramps or muscle pain; pain, tingling, numbness in the hands or feet; seizures -unusual bleeding or bruising -unusually weak or tired Side effects that usually do not require medical attention (report to your doctor or health care professional if they continue or are  bothersome): -constipation -diarrhea -headache -joint pain -loss of appetite -muscle pain -runny nose -tiredness -upset stomach This list may not describe all possible side effects. Call your doctor for medical advice about side effects. You may report side effects to FDA at 1-800-FDA-1088. Where should I keep my medicine? This medicine is only given in a clinic, doctor's office, or other health care setting and will not be stored at home. NOTE: This sheet is a summary. It may not cover all possible information. If you have questions about this medicine, talk to your doctor, pharmacist, or health care provider.  2018 Elsevier/Gold Standard (2016-04-05 19:17:21)   You may want to consider seeing Dr Wandra Feinstein, a sports medicine physician, in [place] and [place] who specializes in bone health.   Pneumococcal Polysaccharide Vaccine: What You Need to Know 1. Why get vaccinated? Vaccination can protect older adults (and some children and younger adults) from pneumococcal disease. Pneumococcal disease is caused by bacteria that can spread from person to person through close contact. It can cause ear infections, and it can also lead to more serious infections of the:  Lungs (pneumonia),  Blood (bacteremia), and  Covering of the brain and spinal cord (meningitis). Meningitis can cause deafness and brain damage, and it can be fatal.  Anyone can get pneumococcal disease, but children under 29 years of age, people with certain medical conditions, adults over 59 years of age, and cigarette smokers are at the highest risk. About 18,000 older adults die each year from pneumococcal disease in the [place]. Treatment of pneumococcal infections with penicillin and other drugs used to be more effective. But some strains of the disease have become resistant to these drugs. This makes prevention of the disease, through vaccination, even more important. 2. Pneumococcal polysaccharide vaccine  (PPSV23) Pneumococcal polysaccharide vaccine (PPSV23) protects against 23 types of pneumococcal bacteria. It will not prevent all pneumococcal disease. PPSV23 is recommended for:  All adults 38 years of age and older,  Anyone 2 through 67 years of age with certain long-term health problems,  Anyone 2 through 67 years of age with a weakened immune system,  Adults 34 through 67 years of age who smoke cigarettes or have asthma.  Most people need only one dose of PPSV. A second dose is recommended for certain high-risk groups. People 66 and older should get a dose even if they have gotten one or more doses of the vaccine before they turned 65. Your healthcare provider can give you more information about these recommendations. Most healthy adults develop protection within 2 to 3 weeks of getting the shot. 3. Some people should not get this vaccine  Anyone who has had a life-threatening allergic reaction to PPSV should not get another dose.  Anyone who has a severe allergy to any component of PPSV should not receive it. Tell your provider if you have any severe allergies.  Anyone who is moderately or severely ill when the shot is scheduled may be asked to wait until they recover before getting the vaccine. Someone with a mild illness can usually be vaccinated.  Children less than 91 years of age should not receive this vaccine.  There is no evidence that PPSV is harmful to either a pregnant woman or to her fetus. However, as a precaution, women who need the vaccine should be vaccinated before becoming pregnant, if possible. 4. Risks of a vaccine reaction With any medicine, including vaccines, there is a chance of side effects. These are usually mild and go away on their own, but serious reactions are also possible. About half of people who get PPSV have mild side effects, such as redness or pain where the shot is given, which go away within about two days. Less than 1 out of 100 people develop  a fever, muscle aches, or more severe local reactions. Problems that could happen after any vaccine:  People sometimes faint after a medical procedure, including vaccination. Sitting or lying down for about 15 minutes can help prevent fainting, and injuries caused by a fall. Tell your doctor if you feel dizzy, or have vision changes or ringing in the ears.  Some people get severe pain in the shoulder and have difficulty moving the arm where a shot was given. This happens very rarely.  Any medication can cause a severe allergic reaction. Such reactions from a vaccine are very rare, estimated at about 1 in a million doses, and would happen within a few minutes to a few hours after the vaccination. As with any medicine, there is a very remote chance of a vaccine causing a serious injury or death. The safety of vaccines is always being monitored. For more information, visit: http://www.aguilar.org/ 5. What if there is a serious reaction? What should I look for? Look for anything that  concerns you, such as signs of a severe allergic reaction, very high fever, or unusual behavior. Signs of a severe allergic reaction can include hives, swelling of the face and throat, difficulty breathing, a fast heartbeat, dizziness, and weakness. These would usually start a few minutes to a few hours after the vaccination. What should I do? If you think it is a severe allergic reaction or other emergency that can't wait, call 9-1-1 or get to the nearest hospital. Otherwise, call your doctor. Afterward, the reaction should be reported to the Vaccine Adverse Event Reporting System (VAERS). Your doctor might file this report, or you can do it yourself through the VAERS web site at www.vaers.SamedayNews.es, or by calling (608) 295-7152. VAERS does not give medical advice. 6. How can I learn more?  Ask your doctor. He or she can give you the vaccine package insert or suggest other sources of information.  Call your local or  state health department.  Contact the Centers for Disease Control and Prevention (CDC): ? Call 778 747 3684 (1-800-CDC-INFO) or ? Visit CDC's website at http://hunter.com/ CDC Pneumococcal Polysaccharide Vaccine VIS (07/19/13) This information is not intended to replace advice given to you by your health care provider. Make sure you discuss any questions you have with your health care provider. Document Released: 01/09/2006 Document Revised: 12/03/2015 Document Reviewed: 12/03/2015 Elsevier Interactive Patient Education  2017 [employer].

## 2017-09-18 DIAGNOSIS — R1030 Lower abdominal pain, unspecified: Secondary | ICD-10-CM | POA: Diagnosis not present

## 2017-09-18 DIAGNOSIS — K5904 Chronic idiopathic constipation: Secondary | ICD-10-CM | POA: Diagnosis not present

## 2017-09-18 DIAGNOSIS — M6289 Other specified disorders of muscle: Secondary | ICD-10-CM | POA: Diagnosis not present

## 2017-09-18 DIAGNOSIS — R140 Abdominal distension (gaseous): Secondary | ICD-10-CM | POA: Diagnosis not present

## 2017-09-26 NOTE — Telephone Encounter (Signed)
Discussed at [facility] Pt given info on Prolia Will call back if wishes to try

## 2017-10-05 ENCOUNTER — Ambulatory Visit: Payer: Medicare HMO | Attending: Neurology | Admitting: Physical Therapy

## 2017-10-05 ENCOUNTER — Encounter: Payer: Self-pay | Admitting: Physical Therapy

## 2017-10-05 ENCOUNTER — Telehealth: Payer: Self-pay | Admitting: Family Medicine

## 2017-10-05 DIAGNOSIS — R293 Abnormal posture: Secondary | ICD-10-CM | POA: Insufficient documentation

## 2017-10-05 DIAGNOSIS — M8000XD Age-related osteoporosis with current pathological fracture, unspecified site, subsequent encounter for fracture with routine healing: Secondary | ICD-10-CM

## 2017-10-05 DIAGNOSIS — M542 Cervicalgia: Secondary | ICD-10-CM | POA: Diagnosis not present

## 2017-10-05 NOTE — Telephone Encounter (Signed)
Left detailed message that referral to Dr Layne Benton in [place] has been placed. She should receive a call within a week to schedule. She is welcome to call back with any questions.

## 2017-10-05 NOTE — Therapy (Signed)
[facility] [place], [place], [address] Phone: [phone]   Fax:  [phone]  Physical Therapy Treatment  Patient Details  Name: Kristy Garza MRN: [id number] Date of Birth: [birth year] Referring Provider: Tommas Olp   Encounter Date: 10/05/2017  PT End of Session - 10/05/17 1348    Visit Number  3    Number of Visits  12    Date for PT Re-Evaluation  10/10/17    Authorization Type  10TH VISIT PROGRESS NOTE AND KX MODIFIER AFTER THE 15 VISIT.    PT Start Time  1353    PT Stop Time  1436    PT Time Calculation (min)  43 min    Activity Tolerance  Patient tolerated treatment well    Behavior During Therapy  WFL for tasks assessed/performed       Past Medical History:  Diagnosis Date  . Acid reflux   . Allergy   . Arthritis    ARTHRITIS IN NECK BY DR. Alroy Dust [facility]  . Asthma   . Interstitial cystitis   . Osteoporosis   . PONV (postoperative nausea and vomiting)   . Tinnitus   . Trigeminal neuralgia    Atypical trigeminal neuralgia    Past Surgical History:  Procedure Laterality Date  . ABDOMINAL HYSTERECTOMY  2000  . CYSTOSCOPY WITH HYDRODISTENSION AND BIOPSY N/A 06/11/2012   Procedure: CYSTOSCOPY/BIOPSY/HYDRODISTENSION with Instillation of Pyridium and Marcaine and Kenalog;  Surgeon: Ailene Rud, MD;  Location: [facility];  Service: Urology;  Laterality: N/A;  1 hour requested for this case  BLADDER BIOPSY  . ETHMOIDECTOMY  2012  . SEPTOPLASTY  1980's  . vocal cord surgery   1990's   polyp removal    There were no vitals filed for this visit.  Subjective Assessment - 10/05/17 1347    Subjective  Reports that her neck is some better but still having headaches. Reports that she clinches her teeth at night.    Pertinent History  OP; TMJ; COPD.    Patient Stated Goals  Decrease my pain so I'm in a better mood.    Currently in Pain?  Yes    Pain Score  4     Pain Location   Neck    Pain Orientation  Right;Left    Pain Descriptors / Indicators  Tender    Pain Type  Chronic pain    Pain Onset  More than a month ago    Pain Frequency  Constant         [facility] PT Assessment - 10/05/17 0001      Assessment   Medical Diagnosis  Neck pain.      Restrictions   Weight Bearing Restrictions  No                   [facility] Adult PT Treatment/Exercise - 10/05/17 0001      Modalities   Modalities  Electrical Stimulation;Moist Heat;Ultrasound      Moist Heat Therapy   Number Minutes Moist Heat  15 Minutes    Moist Heat Location  Cervical      Electrical Stimulation   Electrical Stimulation Location  B UT    Electrical Stimulation Action  IFC    Electrical Stimulation Parameters  80-150 hz x15 min    Electrical Stimulation Goals  Pain      Ultrasound   Ultrasound Location  B UT    Ultrasound Parameters  1.5 w/cm2, 100%, 1 mhz x10  min    Ultrasound Goals  Pain      Manual Therapy   Manual Therapy  Soft tissue mobilization    Soft tissue mobilization  B UT, cervical paraspinals to reduce muslce tightness and pain                  PT Long Term Goals - 08/29/17 1256      PT LONG TERM GOAL #1   Title  be independent in advanced HEP    Time  6    Period  Weeks    Status  New      PT LONG TERM GOAL #2   Title  No more than one headache per week.    Time  6    Period  Weeks    Status  New      PT LONG TERM GOAL #3   Title  Increase active cervical rotation to 70 degrees+ so patient can turn head more easily while driving.    Time  6    Period  Weeks    Status  New      PT LONG TERM GOAL #4   Title  Perform ADL's with pain not > 2-3/10.    Time  6    Period  Weeks    Status  New            Plan - 10/05/17 1452    Clinical Impression Statement  Patient tolerated today's treatment well and arrived with 4/10 cervical pain and tenderness. Patient did not report any tenderness with manual therapy today and no complaints  of pain. Patient educated regarding sleeping posture with types of pillows and also provided a handout regarding TENS unit purchase. Normal modalities response noted following removal of the modalities.    Rehab Potential  Good    PT Frequency  2x / week    PT Duration  6 weeks    PT Treatment/Interventions  ADLs/Self Care Home Management;Cryotherapy;Electrical Stimulation;Moist Heat;Therapeutic activities;Therapeutic exercise;Patient/family education;Manual techniques;Passive range of motion    PT Next Visit Plan  STW/M including suboccipital release technique; chin tucks and cervical extension; corner stretch and scapular retraction; combo e'stim/U/S; dry needling.    Consulted and Agree with Plan of Care  Patient       Patient will benefit from skilled therapeutic intervention in order to improve the following deficits and impairments:  Decreased activity tolerance, Decreased range of motion, Postural dysfunction, Pain  Visit Diagnosis: Cervicalgia  Abnormal posture     Problem List Patient Active Problem List   Diagnosis Date Noted  . Anxiety state 01/19/2016  . Adjustment disorder with anxious mood 01/19/2016  . HLD (hyperlipidemia) 01/07/2016  . IBS (irritable bowel syndrome) 07/11/2015  . Functional constipation 06/22/2015  . Chronic bronchitis ([place]) 05/07/2015  . 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 Trigeminal neuralgia 04/17/2014  . Face pain 01/22/2014  . 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 Asthma, chronic 05/13/2013  . Generalized anxiety disorder 05/13/2013  . Difficulty speaking 04/11/2013  . 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 Leg cramps 03/23/2013  . Acid reflux 03/11/2013  . Osteoporosis with pathological fracture 01/30/2013  . Abdominal pain 01/16/2013  . Back ache 01/16/2013  . Chronic interstitial cystitis 01/16/2013  . FOM (frequency of micturition) 01/16/2013  . Seasonal allergic rhinitis 07/07/2012  . G E R D 08/15/2007  . VOCAL CORD POLYP, HX  OF 08/15/2007    Standley Brooking, PTA 10/05/2017, 3:07 PM  [facility]  [place], [place], [address] Phone: [phone]   Fax:  [phone]  Name: Kristy Garza MRN: [id number] Date of Birth: 1950-11-30

## 2017-10-06 NOTE — Telephone Encounter (Signed)
Patient decided to see Wandra Feinstein, MD to discuss osteoporosis treatment options. Referral placed. Patient aware.

## 2017-10-09 ENCOUNTER — Telehealth: Payer: Self-pay | Admitting: Family Medicine

## 2017-10-11 NOTE — Telephone Encounter (Signed)
Demographics and insurance card sent

## 2017-10-13 ENCOUNTER — Ambulatory Visit (INDEPENDENT_AMBULATORY_CARE_PROVIDER_SITE_OTHER): Payer: Medicare HMO | Admitting: Family Medicine

## 2017-10-13 ENCOUNTER — Encounter: Payer: Self-pay | Admitting: Family Medicine

## 2017-10-13 VITALS — BP 106/70 | HR 75 | Temp 96.9°F | Ht 67.0 in | Wt 116.6 lb

## 2017-10-13 DIAGNOSIS — R059 Cough, unspecified: Secondary | ICD-10-CM

## 2017-10-13 DIAGNOSIS — J302 Other seasonal allergic rhinitis: Secondary | ICD-10-CM | POA: Diagnosis not present

## 2017-10-13 DIAGNOSIS — R05 Cough: Secondary | ICD-10-CM | POA: Diagnosis not present

## 2017-10-13 DIAGNOSIS — E559 Vitamin D deficiency, unspecified: Secondary | ICD-10-CM | POA: Diagnosis not present

## 2017-10-13 DIAGNOSIS — R5381 Other malaise: Secondary | ICD-10-CM | POA: Diagnosis not present

## 2017-10-13 DIAGNOSIS — M810 Age-related osteoporosis without current pathological fracture: Secondary | ICD-10-CM | POA: Diagnosis not present

## 2017-10-13 MED ORDER — ALBUTEROL SULFATE (2.5 MG/3ML) 0.083% IN NEBU
2.5000 mg | INHALATION_SOLUTION | Freq: Once | RESPIRATORY_TRACT | Status: AC
  Administered 2017-10-13: 2.5 mg via RESPIRATORY_TRACT

## 2017-10-13 MED ORDER — METHYLPREDNISOLONE ACETATE 40 MG/ML IJ SUSP
40.0000 mg | Freq: Once | INTRAMUSCULAR | Status: AC
Start: 2017-10-13 — End: 2017-10-13
  Administered 2017-10-13: 40 mg via INTRAMUSCULAR

## 2017-10-13 NOTE — Progress Notes (Signed)
   HPI  Patient presents today here with a flare of seasonal allergies.  Patient states that she has had 1 week of cough, nasal congestion, intermittent headache.  She states that it began after she used an air conditioner for a night she consider that this may be due to a, however has not improved after stopping.  She has had conjunctival injection/red eyes, and intermittent sore throat.  Patient has long history of seasonal allergies treated with Astelin, Nasonex, Allegra. At times she uses Benadryl as well  Patient also states that on her peak flow meter this morning she blew 370 where she usually  achieves about 400. - requests nebulizer treatment  PMH: Smoking status noted ROS: Per HPI  Objective: BP 106/70   Pulse 75   Temp (!) 96.9 F (36.1 C) (Oral)   Ht 5\' 7"  (1.702 m)   Wt 116 lb 9.6 oz (52.9 kg)   SpO2 100%   BMI 18.26 kg/m  Gen: NAD, alert, cooperative with exam HEENT: NCAT,  CV: RRR, good S1/S2, no murmur Resp: CTABL, no wheezes, non-labored Abd: SNTND, BS present, no guarding or organomegaly Ext: No edema, warm Neuro: Alert and oriented, No gross deficits  Assessment and plan:  #Cough, seasonal allergies Patient has history of asthma No wheezing on exam Continue albuterol, given albuterol nebulizer in clinic today with some improvement subjectively given IM Depo-Medrol 40 mg for her increased allergic burden lately   Meds ordered this encounter  Medications  . methylPREDNISolone acetate (DEPO-MEDROL) injection 40 mg    Laroy Apple, MD [facility] 10/13/2017, 11:52 AM

## 2017-10-13 NOTE — Patient Instructions (Signed)
Great to see you!   

## 2017-10-13 NOTE — Telephone Encounter (Signed)
Sonia from [place] calling to confirm status for pt prolia injection. Please call back

## 2017-10-17 ENCOUNTER — Encounter: Payer: Self-pay | Admitting: Physical Therapy

## 2017-10-26 ENCOUNTER — Ambulatory Visit (INDEPENDENT_AMBULATORY_CARE_PROVIDER_SITE_OTHER): Payer: Medicare HMO | Admitting: Family

## 2017-10-26 ENCOUNTER — Encounter: Payer: Self-pay | Admitting: Family

## 2017-10-26 ENCOUNTER — Encounter: Payer: Self-pay | Admitting: Physical Therapy

## 2017-10-26 ENCOUNTER — Ambulatory Visit: Payer: Medicare HMO | Attending: Neurology | Admitting: Physical Therapy

## 2017-10-26 VITALS — BP 108/68 | HR 70 | Temp 97.0°F | Ht 67.0 in | Wt 114.6 lb

## 2017-10-26 DIAGNOSIS — R05 Cough: Secondary | ICD-10-CM

## 2017-10-26 DIAGNOSIS — K5900 Constipation, unspecified: Secondary | ICD-10-CM

## 2017-10-26 DIAGNOSIS — F411 Generalized anxiety disorder: Secondary | ICD-10-CM | POA: Diagnosis not present

## 2017-10-26 DIAGNOSIS — R293 Abnormal posture: Secondary | ICD-10-CM | POA: Diagnosis not present

## 2017-10-26 DIAGNOSIS — R309 Painful micturition, unspecified: Secondary | ICD-10-CM | POA: Diagnosis not present

## 2017-10-26 DIAGNOSIS — M26623 Arthralgia of bilateral temporomandibular joint: Secondary | ICD-10-CM | POA: Diagnosis not present

## 2017-10-26 DIAGNOSIS — M542 Cervicalgia: Secondary | ICD-10-CM

## 2017-10-26 DIAGNOSIS — R053 Chronic cough: Secondary | ICD-10-CM

## 2017-10-26 DIAGNOSIS — R69 Illness, unspecified: Secondary | ICD-10-CM | POA: Diagnosis not present

## 2017-10-26 LAB — URINALYSIS, COMPLETE
Bilirubin, UA: NEGATIVE
GLUCOSE, UA: NEGATIVE
Ketones, UA: NEGATIVE
NITRITE UA: NEGATIVE
PH UA: 5.5 (ref 5.0–7.5)
Protein, UA: NEGATIVE
RBC, UA: NEGATIVE
Specific Gravity, UA: <1.005 — ABNORMAL LOW (ref 1.005–1.030)
Urobilinogen, Ur: 0.2 mg/dL (ref 0.2–1.0)

## 2017-10-26 LAB — MICROSCOPIC EXAMINATION
Bacteria, UA: NONE SEEN
RBC MICROSCOPIC, UA: NONE SEEN /HPF (ref 0–2)
Renal Epithel, UA: NONE SEEN /hpf

## 2017-10-26 MED ORDER — DICLOFENAC SODIUM 75 MG PO TBEC: 75.0000 mg | DELAYED_RELEASE_TABLET | Freq: Two times a day (BID) | ORAL | 0 refills | Status: DC

## 2017-10-26 MED ORDER — ESCITALOPRAM OXALATE 5 MG PO TABS: 5.0000 mg | ORAL_TABLET | Freq: Every day | ORAL | 1 refills | Status: DC

## 2017-10-26 MED ORDER — BENZONATATE 200 MG PO CAPS: 200.0000 mg | ORAL_CAPSULE | Freq: Two times a day (BID) | ORAL | 0 refills | Status: DC | PRN

## 2017-10-26 NOTE — Patient Instructions (Signed)
Cough, Adult  Coughing is a reflex that clears your throat and your airways. Coughing helps to heal and protect your lungs. It is normal to cough occasionally, but a cough that happens with other symptoms or lasts a long time may be a sign of a condition that needs treatment. A cough may last only 2-3 weeks (acute), or it may last longer than 8 weeks (chronic).  What are the causes?  Coughing is commonly caused by:   Breathing in substances that irritate your lungs.   A viral or bacterial respiratory infection.   Allergies.   Asthma.   Postnasal drip.   Smoking.   Acid backing up from the stomach into the esophagus (gastroesophageal reflux).   Certain medicines.   Chronic lung problems, including COPD (or rarely, lung cancer).   Other medical conditions such as heart failure.    Follow these instructions at home:  Pay attention to any changes in your symptoms. Take these actions to help with your discomfort:   Take medicines only as told by your health care provider.  ? If you were prescribed an antibiotic medicine, take it as told by your health care provider. Do not stop taking the antibiotic even if you start to feel better.  ? Talk with your health care provider before you take a cough suppressant medicine.   Drink enough fluid to keep your urine clear or pale yellow.   If the air is dry, use a cold steam vaporizer or humidifier in your bedroom or your home to help loosen secretions.   Avoid anything that causes you to cough at work or at home.   If your cough is worse at night, try sleeping in a semi-upright position.   Avoid cigarette smoke. If you smoke, quit smoking. If you need help quitting, ask your health care provider.   Avoid caffeine.   Avoid alcohol.   Rest as needed.    Contact a health care provider if:   You have new symptoms.   You cough up pus.   Your cough does not get better after 2-3 weeks, or your cough gets worse.   You cannot control your cough with suppressant  medicines and you are losing sleep.   You develop pain that is getting worse or pain that is not controlled with pain medicines.   You have a fever.   You have unexplained weight loss.   You have night sweats.  Get help right away if:   You cough up blood.   You have difficulty breathing.   Your heartbeat is very fast.  This information is not intended to replace advice given to you by your health care provider. Make sure you discuss any questions you have with your health care provider.  Document Released: 09/10/2010 Document Revised: 08/20/2015 Document Reviewed: 05/21/2014  Elsevier Interactive Patient Education  2018 Elsevier Inc.

## 2017-10-26 NOTE — Progress Notes (Signed)
Subjective:    Patient ID: Kristy Garza, female    DOB: 05/26/1950, 67 y.o.   MRN: [id number]  Chief Complaint  Patient presents with  . Cough  . burning and pain with urinating   Pt presents to the office today for chronic cough and UTI symptoms. PT is followed by Pulmonologist, acupuncturists, and  allergen specialists for chronic cough.  Cough  This is a chronic problem. The current episode started more than 1 year ago. The problem has been unchanged. The problem occurs every few minutes. The cough is productive of sputum (some times, clear). Associated symptoms include nasal congestion, postnasal drip and rhinorrhea. Pertinent negatives include no chills, ear congestion, ear pain, sore throat, shortness of breath or wheezing. She has tried rest Curator) for the symptoms. The treatment provided mild relief.  Dysuria   This is a recurrent problem. The current episode started in the past 7 days. The problem occurs every urination. The problem has been unchanged. The quality of the pain is described as burning. The pain is at a severity of 4/10. There has been no fever. Associated symptoms include flank pain. Pertinent negatives include no chills, frequency, hematuria, hesitancy, nausea, urgency or vomiting. She has tried increased fluids for the symptoms. The treatment provided mild relief.  Constipation  This is a chronic problem. The current episode started more than 1 year ago. The problem has been waxing and waning since onset. Her stool frequency is 1 time per day. Associated symptoms include bloating and flatus. Pertinent negatives include no back pain, difficulty urinating, nausea or vomiting. She has tried laxatives and stool softeners for the symptoms. The treatment provided moderate relief.  Anxiety  Presents for initial visit. Onset was 1 to 5 years ago. The problem has been waxing and waning. Symptoms include depressed mood, excessive worry, irritability and nervous/anxious  behavior. Patient reports no nausea or shortness of breath. Symptoms occur most days. The severity of symptoms is moderate.    TMJ  PT states she is followed by an Chief Financial Officer and wears her mouth guard every night. States this mildly helps, but continues to grind her teeth.     Review of Systems  Constitutional: Positive for irritability. Negative for chills.  HENT: Positive for postnasal drip and rhinorrhea. Negative for ear pain and sore throat.   Respiratory: Positive for cough. Negative for shortness of breath and wheezing.   Gastrointestinal: Positive for bloating, constipation and flatus. Negative for nausea and vomiting.  Genitourinary: Positive for dysuria and flank pain. Negative for difficulty urinating, frequency, hematuria, hesitancy and urgency.  Musculoskeletal: Negative for back pain.  Psychiatric/Behavioral: The patient is nervous/anxious.   All other systems reviewed and are negative.      Objective:   Physical Exam  Constitutional: She is oriented to person, place, and time. She appears well-developed and well-nourished. No distress.  HENT:  Head: Normocephalic and atraumatic.  Right Ear: External ear normal.  Left Ear: External ear normal.  Nose: Mucosal edema and rhinorrhea present.  Mouth/Throat: Posterior oropharyngeal erythema present.  Eyes: Pupils are equal, round, and reactive to light.  Neck: Normal range of motion. Neck supple. No thyromegaly present.  Cardiovascular: Normal rate, regular rhythm, normal heart sounds and intact distal pulses.  No murmur heard. Pulmonary/Chest: Effort normal and breath sounds normal. No respiratory distress. She has no wheezes.  Constant nonproductive cough   Abdominal: Soft. Bowel sounds are normal. She exhibits no distension. There is no tenderness.  Musculoskeletal: Normal range of motion.  She exhibits no edema or tenderness.  Neurological: She is alert and oriented to person, place, and time. She displays normal  reflexes. No cranial nerve deficit.  Skin: Skin is warm and dry.  Psychiatric: She has a normal mood and affect. Her behavior is normal. Judgment and thought content normal.  Vitals reviewed.     BP 108/68   Pulse 70   Temp (!) 97 F (36.1 C) (Oral)   Ht 5\' 7"  (1.702 m)   Wt 114 lb 9.6 oz (52 kg)   BMI 17.95 kg/m      Assessment & Plan:  Kristy Garza comes in today with chief complaint of Cough and burning and pain with urinating   Diagnosis and orders addressed:  1. Pain passing urine Urine negative in office, will send off for urine culture - Urinalysis, Complete  2. Chronic cough Keep follow up with specialists  Continue allegra  - benzonatate (TESSALON) 200 MG capsule; Take 1 capsule (200 mg total) by mouth 2 (two) times daily as needed for cough.  Dispense: 40 capsule; Refill: 0  3. Constipation, unspecified constipation type  Continue linzess Force fluids  4. GAD (generalized anxiety disorder) Will start lexapro today Stress management discussed RTO in 6 weeks to PCP - escitalopram (LEXAPRO) 5 MG tablet; Take 1 tablet (5 mg total) by mouth daily.  Dispense: 30 tablet; Refill: 1  5. Bilateral temporomandibular joint pain Keep  Follow up with Oral Surgeon  - diclofenac (VOLTAREN) 75 MG EC tablet; Take 1 tablet (75 mg total) by mouth 2 (two) times daily.  Dispense: 30 tablet; Refill: 0   Evelina Dun, FNP

## 2017-10-26 NOTE — Therapy (Signed)
[facility] [place], [place], [address] Phone: [phone]   Fax:  [phone]  Physical Therapy Treatment  Patient Details  Name: Kristy Garza MRN: [id number] Date of Birth: [birth year] Referring Provider: Tommas Olp   Encounter Date: 10/26/2017  PT End of Session - 10/26/17 1432    Visit Number  4    Number of Visits  12    Date for PT Re-Evaluation  10/10/17    Authorization Type  10TH VISIT PROGRESS NOTE AND KX MODIFIER AFTER THE 15 VISIT.    PT Start Time  1432    PT Stop Time  1516    PT Time Calculation (min)  44 min    Activity Tolerance  Patient tolerated treatment well    Behavior During Therapy  WFL for tasks assessed/performed       Past Medical History:  Diagnosis Date  . Acid reflux   . Allergy   . Arthritis    ARTHRITIS IN NECK BY DR. Alroy Dust [facility]  . Asthma   . Interstitial cystitis   . Osteoporosis   . PONV (postoperative nausea and vomiting)   . Tinnitus   . Trigeminal neuralgia    Atypical trigeminal neuralgia    Past Surgical History:  Procedure Laterality Date  . ABDOMINAL HYSTERECTOMY  2000  . CYSTOSCOPY WITH HYDRODISTENSION AND BIOPSY N/A 06/11/2012   Procedure: CYSTOSCOPY/BIOPSY/HYDRODISTENSION with Instillation of Pyridium and Marcaine and Kenalog;  Surgeon: Ailene Rud, MD;  Location: [facility];  Service: Urology;  Laterality: N/A;  1 hour requested for this case  BLADDER BIOPSY  . ETHMOIDECTOMY  2012  . SEPTOPLASTY  1980's  . vocal cord surgery   1990's   polyp removal    There were no vitals filed for this visit.  Subjective Assessment - 10/26/17 1431    Subjective  Reports that her neck "isn't too good." Reports that her jaw and teeth are aching.    Pertinent History  OP; TMJ; COPD.    Patient Stated Goals  Decrease my pain so I'm in a better mood.    Currently in Pain?  Yes    Pain Score  5     Pain Location  Neck    Pain Orientation   Right;Left    Pain Descriptors / Indicators  Aching    Pain Type  Chronic pain    Pain Onset  More than a month ago    Pain Frequency  Constant         [facility] PT Assessment - 10/26/17 0001      Assessment   Medical Diagnosis  Neck pain.      Restrictions   Weight Bearing Restrictions  No                   [facility] Adult PT Treatment/Exercise - 10/26/17 0001      Modalities   Modalities  Electrical Stimulation;Moist Heat;Ultrasound      Moist Heat Therapy   Number Minutes Moist Heat  15 Minutes    Moist Heat Location  Cervical      Electrical Stimulation   Electrical Stimulation Location  B UT/ cervical paraspinals    Electrical Stimulation Action  Pre-Mod    Electrical Stimulation Parameters  80-150 hz x15 min    Electrical Stimulation Goals  Pain      Ultrasound   Ultrasound Location  B UT/ cervical paraspinals    Ultrasound Parameters  1.5 w/cm2, 100%, 1 mhz x10  min    Ultrasound Goals  Pain      Manual Therapy   Manual Therapy  Soft tissue mobilization    Soft tissue mobilization  B UT, cervical paraspinals to reduce muscle tightness and pain                  PT Long Term Goals - 08/29/17 1256      PT LONG TERM GOAL #1   Title  be independent in advanced HEP    Time  6    Period  Weeks    Status  New      PT LONG TERM GOAL #2   Title  No more than one headache per week.    Time  6    Period  Weeks    Status  New      PT LONG TERM GOAL #3   Title  Increase active cervical rotation to 70 degrees+ so patient can turn head more easily while driving.    Time  6    Period  Weeks    Status  New      PT LONG TERM GOAL #4   Title  Perform ADL's with pain not > 2-3/10.    Time  6    Period  Weeks    Status  New            Plan - 10/26/17 1540    Clinical Impression Statement  Patient tolerated today's treatment fairly well as she arrived with reports of cervical pain. Patient rated overall improvement with PT has been 50%.  Minimal B UT tightness palpated although more along superior fibers of UT that ascend into lateral neck region and into cervical paraspinals. No increased pain reported by patient during today's treatment. Normal modalities response noted following removal of the modalities. Patient educated regarding use of heat or ice for pain relief.    Rehab Potential  Good    PT Frequency  2x / week    PT Duration  6 weeks    PT Treatment/Interventions  ADLs/Self Care Home Management;Cryotherapy;Electrical Stimulation;Moist Heat;Therapeutic activities;Therapeutic exercise;Patient/family education;Manual techniques;Passive range of motion    PT Next Visit Plan  STW/M including suboccipital release technique; chin tucks and cervical extension; corner stretch and scapular retraction; combo e'stim/U/S; dry needling.    Consulted and Agree with Plan of Care  Patient       Patient will benefit from skilled therapeutic intervention in order to improve the following deficits and impairments:  Decreased activity tolerance, Decreased range of motion, Postural dysfunction, Pain  Visit Diagnosis: Cervicalgia  Abnormal posture     Problem List Patient Active Problem List   Diagnosis Date Noted  . Anxiety state 01/19/2016  . Adjustment disorder with anxious mood 01/19/2016  . HLD (hyperlipidemia) 01/07/2016  . IBS (irritable bowel syndrome) 07/11/2015  . Functional constipation 06/22/2015  . Chronic bronchitis ([place]) 05/07/2015  . 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 Trigeminal neuralgia 04/17/2014  . Face pain 01/22/2014  . 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 Asthma, chronic 05/13/2013  . Generalized anxiety disorder 05/13/2013  . Difficulty speaking 04/11/2013  . 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 Leg cramps 03/23/2013  . Acid reflux 03/11/2013  . Osteoporosis with pathological fracture 01/30/2013  . Abdominal pain 01/16/2013  . Back ache 01/16/2013  . Chronic interstitial cystitis 01/16/2013   . FOM (frequency of micturition) 01/16/2013  . Seasonal allergic rhinitis 07/07/2012  . G E R D 08/15/2007  . VOCAL CORD POLYP,  HX OF 08/15/2007    Standley Brooking, PTA 10/26/2017, 3:47 PM  [facility] [address] [place], [place], [address] Phone: [phone]   Fax:  [phone]  Name: Kristy Garza MRN: [id number] Date of Birth: 13-Jan-1951

## 2017-10-28 ENCOUNTER — Telehealth: Payer: Self-pay | Admitting: Family Medicine

## 2017-10-28 LAB — URINE CULTURE

## 2017-10-30 ENCOUNTER — Other Ambulatory Visit: Payer: Self-pay | Admitting: Family

## 2017-10-30 DIAGNOSIS — M810 Age-related osteoporosis without current pathological fracture: Secondary | ICD-10-CM | POA: Diagnosis not present

## 2017-10-30 MED ORDER — NITROFURANTOIN MONOHYD MACRO 100 MG PO CAPS: 100.0000 mg | ORAL_CAPSULE | Freq: Two times a day (BID) | ORAL | 0 refills | Status: DC

## 2017-10-30 NOTE — Telephone Encounter (Signed)
Pt called = labs reviewed

## 2017-11-01 ENCOUNTER — Encounter: Payer: Self-pay | Admitting: Pediatrics

## 2017-11-01 ENCOUNTER — Ambulatory Visit: Payer: Self-pay | Admitting: Internal Medicine

## 2017-11-01 ENCOUNTER — Ambulatory Visit: Payer: Medicare HMO | Admitting: Physical Therapy

## 2017-11-01 ENCOUNTER — Other Ambulatory Visit: Payer: Self-pay | Admitting: Family Medicine

## 2017-11-01 ENCOUNTER — Ambulatory Visit (INDEPENDENT_AMBULATORY_CARE_PROVIDER_SITE_OTHER): Payer: Medicare HMO | Admitting: Pediatrics

## 2017-11-01 ENCOUNTER — Telehealth: Payer: Self-pay | Admitting: Family Medicine

## 2017-11-01 VITALS — BP 106/69 | HR 68 | Temp 97.0°F | Ht 67.0 in | Wt 115.0 lb

## 2017-11-01 DIAGNOSIS — M542 Cervicalgia: Secondary | ICD-10-CM | POA: Diagnosis not present

## 2017-11-01 DIAGNOSIS — R300 Dysuria: Secondary | ICD-10-CM

## 2017-11-01 DIAGNOSIS — N3090 Cystitis, unspecified without hematuria: Secondary | ICD-10-CM

## 2017-11-01 DIAGNOSIS — N3010 Interstitial cystitis (chronic) without hematuria: Secondary | ICD-10-CM

## 2017-11-01 MED ORDER — FOSFOMYCIN TROMETHAMINE 3 G PO PACK
3.0000 g | PACK | Freq: Once | ORAL | 0 refills | Status: AC
Start: 2017-11-01 — End: 2017-11-01

## 2017-11-01 MED ORDER — DOXYCYCLINE HYCLATE 100 MG PO CAPS: 100.0000 mg | ORAL_CAPSULE | Freq: Two times a day (BID) | ORAL | 0 refills | Status: DC

## 2017-11-01 NOTE — Telephone Encounter (Signed)
Covering pcp please advise. 

## 2017-11-01 NOTE — Telephone Encounter (Signed)
Patient aware.

## 2017-11-01 NOTE — Telephone Encounter (Signed)
I sent in the requested prescription 

## 2017-11-01 NOTE — Progress Notes (Signed)
  Subjective:   Patient ID: Kristy Garza, female    DOB: Nov 04, 1950, 67 y.o.   MRN: [id number] CC: Dysuria (Would like Monural)  HPI: Kristy Garza is a 67 y.o. female   Cystitis: Recently seen for dysuria, urine culture grew 25-50,000k E. coli.  She was started on nitrofurantoin 2 days ago.  She does not like how she feels on the nitrofurantoin, she thinks it is upsetting her stomach.  She stopped taking it.  She is still having dysuria.  No fevers or abdominal pain.  She wants to take fosfomycin instead. She has a history of interstitial cystitis.  She wants to get back in to see her urologist, asks for referral.  Neck pain: Has been ongoing for several years.  Feels stiff throughout the day.  No recent injuries or trauma.  She has been seeing physical therapy.  Osteoporosis: She was recommended to start Prolia. Pt considering it.  Relevant past medical, surgical, family and social history reviewed. Allergies and medications reviewed and updated. Social History   Tobacco Use  Smoking Status Never Smoker  Smokeless Tobacco Never Used   ROS: Per HPI   Objective:    BP 106/69   Pulse 68   Temp (!) 97 F (36.1 C) (Oral)   Ht 5\' 7"  (1.702 m)   Wt 115 lb (52.2 kg)   BMI 18.01 kg/m   Wt Readings from Last 3 Encounters:  11/01/17 115 lb (52.2 kg)  10/26/17 114 lb 9.6 oz (52 kg)  10/13/17 116 lb 9.6 oz (52.9 kg)    Gen: NAD, alert, cooperative with exam, NCAT EYES: EOMI, no conjunctival injection, or no icterus ENT:  TMs pearly gray b/l, OP without erythema LYMPH: no cervical LAD CV: NRRR, normal S1/S2, no murmur, distal pulses 2+ b/l Resp: CTABL, no wheezes, normal WOB Abd: +BS, soft, NTND. no guarding or organomegaly Ext: No edema, warm Neuro: Alert and oriented, strength equal b/l UE and LE, coordination grossly normal MSK: normal range of motion neck.  Assessment & Plan:  [name] was seen today for dysuria.  Diagnoses and all orders for this visit:  Cystitis 50k  E. coli colonies on recent urine culture.  Still with symptoms, will switch to below. -     fosfomycin (MONUROL) 3 g PACK; Take 3 g by mouth once for 1 dose.  Dysuria -     Cancel: Urinalysis, Complete -     Urinalysis, Complete  Neck pain -     Ambulatory referral to Orthopedic Surgery  Interstitial cystitis Ongoing dysuria off and on, history of interstitial cystitis.  Requesting to get back to see her urologist. -     Ambulatory referral to Urology   Follow up plan: Return in about 1 month (around 11/29/2017). Assunta Found, MD [place]

## 2017-11-02 LAB — URINALYSIS, COMPLETE
Bilirubin, UA: NEGATIVE
GLUCOSE, UA: NEGATIVE
Ketones, UA: NEGATIVE
Leukocytes, UA: NEGATIVE
NITRITE UA: NEGATIVE
Protein, UA: NEGATIVE
RBC, UA: NEGATIVE
Specific Gravity, UA: 1.01 (ref 1.005–1.030)
Urobilinogen, Ur: 0.2 mg/dL (ref 0.2–1.0)
pH, UA: 7 (ref 5.0–7.5)

## 2017-11-02 LAB — MICROSCOPIC EXAMINATION
Bacteria, UA: NONE SEEN
EPITHELIAL CELLS (NON RENAL): NONE SEEN /HPF (ref 0–10)
RBC, UA: NONE SEEN /hpf (ref 0–2)
RENAL EPITHEL UA: NONE SEEN /HPF

## 2017-11-03 ENCOUNTER — Other Ambulatory Visit: Payer: Self-pay

## 2017-11-03 MED ORDER — LINACLOTIDE 290 MCG PO CAPS: ORAL_CAPSULE | ORAL | 5 refills | Status: DC

## 2017-11-07 ENCOUNTER — Telehealth: Payer: Self-pay | Admitting: Family Medicine

## 2017-11-07 ENCOUNTER — Other Ambulatory Visit: Payer: Self-pay | Admitting: Family Medicine

## 2017-11-07 NOTE — Telephone Encounter (Signed)
She has been treated with doxy by me 1 week ago and Dr. Evette Doffing with Macrodantin 2 weeks ago. Culture showed sensitivity to both. Because of this she needs follow up with Dr. Evette Doffing. No more antibiotic until seen.  Thanks, WS

## 2017-11-07 NOTE — Telephone Encounter (Signed)
Patient has been contacted.  She is having burning and painful urination.  Seen last week by Dr. Evette Doffing treated with Doxycycline.

## 2017-11-07 NOTE — Telephone Encounter (Signed)
Please contact the patient.

## 2017-11-07 NOTE — Telephone Encounter (Signed)
Patient has appt

## 2017-11-07 NOTE — Telephone Encounter (Signed)
What symptoms do you have? Discomfort burning with urination  How long have you been sick? Since last week  Have you been seen for this problem? Yes last Thursday by Dr. Evette Doffing and does not see much improvement, has been taking antibiotics  If your provider decides to give you a prescription, which pharmacy would you like for it to be sent to? [facility]   Patient informed that this information will be sent to the clinical staff for review and that they should receive a follow up call.

## 2017-11-08 ENCOUNTER — Encounter: Payer: Self-pay | Admitting: Family Medicine

## 2017-11-08 ENCOUNTER — Ambulatory Visit (INDEPENDENT_AMBULATORY_CARE_PROVIDER_SITE_OTHER): Payer: Medicare HMO | Admitting: Family Medicine

## 2017-11-08 VITALS — BP 101/68 | HR 82 | Temp 97.9°F | Ht 67.0 in | Wt 114.4 lb

## 2017-11-08 DIAGNOSIS — R300 Dysuria: Secondary | ICD-10-CM | POA: Diagnosis not present

## 2017-11-08 DIAGNOSIS — N898 Other specified noninflammatory disorders of vagina: Secondary | ICD-10-CM

## 2017-11-08 LAB — URINALYSIS, COMPLETE
BILIRUBIN UA: NEGATIVE
GLUCOSE, UA: NEGATIVE
Ketones, UA: NEGATIVE
Leukocytes, UA: NEGATIVE
Nitrite, UA: NEGATIVE
PROTEIN UA: NEGATIVE
RBC UA: NEGATIVE
SPEC GRAV UA: 1.01 (ref 1.005–1.030)
UUROB: 0.2 mg/dL (ref 0.2–1.0)
pH, UA: 6.5 (ref 5.0–7.5)

## 2017-11-08 LAB — WET PREP FOR TRICH, YEAST, CLUE
Clue Cell Exam: NEGATIVE
TRICHOMONAS EXAM: NEGATIVE
Yeast Exam: NEGATIVE

## 2017-11-08 LAB — MICROSCOPIC EXAMINATION
Epithelial Cells (non renal): NONE SEEN /hpf (ref 0–10)
RBC, UA: NONE SEEN /hpf (ref 0–2)
RENAL EPITHEL UA: NONE SEEN /HPF
WBC, UA: NONE SEEN /hpf (ref 0–5)

## 2017-11-08 NOTE — Progress Notes (Signed)
Subjective: CC: dysuria PCP: Timmothy Euler, MD TDH:RCBULA Kristy Garza is a 67 y.o. female presenting to clinic today for:  1. Urinary symptoms Patient with an almost 2-week history of dysuria.  She describes this as pain externally with urination.  She denies any increased frequency.  She does have some lower quadrant pelvic fullness and pain.  She states that she has known history of interstitial cystitis and actually has an upcoming appointment in [place] with the pelvic clinic.  She also has a follow-up with her urologist scheduled.  She denies any hematuria, fevers or chills.  She is status post treatment with a few days of nitrofurantoin and then recently fosfomycin.  Neither treatments have improved symptoms.  She started taking cranberry tablets.  Denies any abnormal vaginal discharge.  She has a history of hysterectomy.   ROS: Per HPI  Allergies  Allergen Reactions  . Cefuroxime Axetil Other (See Comments)    Extreme gas  . Augmentin [Amoxicillin-Pot Clavulanate] Diarrhea  . Avelox [Moxifloxacin Hcl In Nacl] Other (See Comments)    Tremors   . Biaxin [Clarithromycin]     Unsure of reaction  . Cedax [Ceftibuten] Other (See Comments)    tremors  . Ciprofloxacin Other (See Comments)    Blurred vision  . Clindamycin/Lincomycin     tachycardia  . Gabapentin     Constipation  . Levofloxacin Other (See Comments)    Feels dehydrated  . Lyrica [Pregabalin]     Drowsiness, dry mouth  . Singulair [Montelukast Sodium] Other (See Comments)    Numbness and tingling in hand.  . Sulfonamide Derivatives Other (See Comments)    Gi upset   Past Medical History:  Diagnosis Date  . Acid reflux   . Allergy   . Arthritis    ARTHRITIS IN NECK BY DR. Alroy Dust [facility]  . Asthma   . Interstitial cystitis   . Osteoporosis   . PONV (postoperative nausea and vomiting)   . Tinnitus   . Trigeminal neuralgia    Atypical trigeminal neuralgia    Current Outpatient Medications:  .   acetaminophen (TYLENOL) 500 MG tablet, Take by mouth., Disp: , Rfl:  .  albuterol (PROVENTIL HFA;VENTOLIN HFA) 108 (90 Base) MCG/ACT inhaler, Inhale two puffs every four to six hours as needed for cough or wheeze., Disp: 18 Inhaler, Rfl: 0 .  ARNUITY ELLIPTA 200 MCG/ACT AEPB, TAKE 1 PUFF BY MOUTH EVERY DAY, Disp: 30 each, Rfl: 2 .  azelastine (ASTELIN) 0.1 % nasal spray, PLACE 2 SPRAYS INTO BOTH NOSTRILS 2 (TWO) TIMES DAILY. USE IN EACH NOSTRIL AS DIRECTED, Disp: 30 mL, Rfl: 2 .  b complex vitamins capsule, Take 1 capsule by mouth daily., Disp: , Rfl:  .  benzonatate (TESSALON) 200 MG capsule, Take 1 capsule (200 mg total) by mouth 2 (two) times daily as needed for cough., Disp: 40 capsule, Rfl: 0 .  dextromethorphan-guaiFENesin (MUCINEX DM) 30-600 MG 12hr tablet, Take 1 tablet by mouth 2 (two) times daily., Disp: , Rfl:  .  fexofenadine (ALLEGRA) 180 MG tablet, Take 180 mg by mouth daily., Disp: , Rfl:  .  hyoscyamine (ANASPAZ) 0.125 MG TBDP disintergrating tablet, Place 0.125 mg under the tongue., Disp: , Rfl:  .  lactulose (CHRONULAC) 10 GM/15ML solution, Take 45 mLs (30 g total) by mouth 2 (two) times daily as needed for mild constipation., Disp: 240 mL, Rfl: 0 .  lidocaine (XYLOCAINE) 2 % jelly, Apply topically as needed., Disp: 30 mL, Rfl: 10 .  Methylcellulose, Laxative, (  CITRUCEL PO), Take by mouth., Disp: , Rfl:  .  mometasone (NASONEX) 50 MCG/ACT nasal spray, Place 2 sprays into the nose daily., Disp: 17 g, Rfl: 5 .  pantoprazole (PROTONIX) 40 MG tablet, Take 1 tablet (40 mg total) by mouth daily., Disp: 30 tablet, Rfl: 5 .  phenylephrine (SUDAFED PE) 10 MG TABS tablet, Take 10 mg by mouth every 4 (four) hours as needed., Disp: , Rfl:  .  Polyethylene Glycol 3350 (MIRALAX PO), Take by mouth as needed., Disp: , Rfl:  .  Probiotic Product (PROBIOTIC PO), Take by mouth daily., Disp: , Rfl:  .  ranitidine (ZANTAC) 150 MG tablet, Take 1 tablet (150 mg total) by mouth at bedtime., Disp: 60  tablet, Rfl: 5 .  Simethicone (GAS-X PO), Take by mouth., Disp: , Rfl:  .  tizanidine (ZANAFLEX) 2 MG capsule, Take 1 capsule (2 mg total) by mouth 3 (three) times daily as needed for muscle spasms., Disp: 60 capsule, Rfl: 2 .  traMADol (ULTRAM) 50 MG tablet, Take 1 tablet (50 mg total) by mouth every 6 (six) hours as needed., Disp: 30 tablet, Rfl: 1 .  UNABLE TO FIND, Take 1 capsule by mouth daily. Dv3- vitamin D 3 plus immune support, Disp: , Rfl:  Social History   Socioeconomic History  . Marital status: Married    Spouse name: Not on file  . Number of children: 3  . Years of education: 15  . Highest education level: Some college, no degree  Occupational History  . Occupation: CNA    Comment: Retired  Scientific laboratory technician  . Financial resource strain: Not on file  . Food insecurity:    Worry: Never true    Inability: Never true  . Transportation needs:    Medical: No    Non-medical: No  Tobacco Use  . Smoking status: Never Smoker  . Smokeless tobacco: Never Used  Substance and Sexual Activity  . Alcohol use: No    Alcohol/week: 0.0 standard drinks    Comment: 01-19-2016 per pt no  . Drug use: No    Comment: 01-19-2016 per pt no   . Sexual activity: Never  Lifestyle  . Physical activity:    Days per week: 0 days    Minutes per session: 0 min  . Stress: Not at all  Relationships  . Social connections:    Talks on phone: More than three times a week    Gets together: More than three times a week    Attends religious service: Never    Active member of club or organization: No    Attends meetings of clubs or organizations: Never    Relationship status: Married  . Intimate partner violence:    Fear of current or ex partner: No    Emotionally abused: No    Physically abused: No    Forced sexual activity: No  Other Topics Concern  . Not on file  Social History Narrative   Lives with husband.    Family History  Problem Relation Age of Onset  . Hyperlipidemia Mother   .  Arthritis Mother   . Cancer Mother   . Lymphoma Mother   . Cancer Father   . Allergies Father   . Allergies Sister   . Allergies Sister   . Allergic rhinitis Neg Hx   . Angioedema Neg Hx   . Asthma Neg Hx   . Eczema Neg Hx   . Immunodeficiency Neg Hx   . Urticaria Neg Hx  Objective: Office vital signs reviewed. BP 101/68   Pulse 82   Temp 97.9 F (36.6 C) (Oral)   Ht 5\' 7"  (1.702 m)   Wt 114 lb 6.4 oz (51.9 kg)   BMI 17.92 kg/m   Physical Examination:  General: Awake, alert, well nourished, nontoxic. No acute distress GU: external vaginal tissue atrophic, cervix surgically absent, no discharge, no bleeding   No results found for this or any previous visit (from the past 24 hour(s)).   Assessment/ Plan: 67 y.o. female   1. Dysuria Urinalysis unremarkable except for very few bacteria on urine microscopy.  This is been sent for urine culture for completion.  She is status post treatment with nitrofurantoin and fosfomycin.  I would be surprised if she had significant bacterial growth on urine culture.  This is more likely related to her known interstitial cystitis.  I agree with appointment with pelvic clinic and urology.  Patient may continue cranberry tablets if she finds them helpful. - Urinalysis, Complete - Urine Culture  2. Vaginal irritation No evidence of yeast or bacterial vaginosis on wet prep.  She did have quite a few bacteria.  This may be related to recent use of antibiotics.  I encouraged her to use probiotic.  Patient is not sexually active, therefore STI testing was not performed today. - WET PREP FOR [place], YEAST, CLUE   Orders Placed This Encounter  Procedures  . Urine Culture  . WET PREP FOR [place], YEAST, CLUE  . Urinalysis, Complete   No orders of the defined types were placed in this encounter.    [name], DO [place] [phone]

## 2017-11-08 NOTE — Patient Instructions (Signed)
Your urine does not appear infected.  I sent it for culture just to be sure.  You had a wet prep performed today to look and see if there is any evidence of yeast causing your irritation.  Your symptoms most certainly may be related to interstitial cystitis.   Interstitial Cystitis Interstitial cystitis is a condition that causes inflammation of the bladder. The bladder is a hollow organ in the lower part of your abdomen. It stores urine after the urine is made by your kidneys. With interstitial cystitis, you may have pain in the bladder area. You may also have a frequent and urgent need to urinate. The severity of interstitial cystitis can vary from person to person. You may have flare-ups of the condition, and then it may go away for a while. For many people who have this condition, it becomes a long-term problem. What are the causes? The cause of this condition is not known. What increases the risk? This condition is more likely to develop in women. What are the signs or symptoms? Symptoms of interstitial cystitis vary, and they can change over time. Symptoms may include:  Discomfort or pain in the bladder area. This can range from mild to severe. The pain may change in intensity as the bladder fills with urine or as it empties.  Pelvic pain.  An urgent need to urinate.  Frequent urination.  Pain during sexual intercourse.  Pinpoint bleeding on the bladder wall.  For women, the symptoms often get worse during menstruation. How is this diagnosed? This condition is diagnosed by evaluating your symptoms and ruling out other causes. A physical exam will be done. Various tests may be done to rule out other conditions. Common tests include:  Urine tests.  Cystoscopy. In this test, a tool that is like a very thin telescope is used to look into your bladder.  Biopsy. This involves taking a sample of tissue from the bladder wall to be examined under a microscope.  How is this  treated? There is no cure for interstitial cystitis, but treatment methods are available to control your symptoms. Work closely with your health care provider to find the treatments that will be most effective for you. Treatment options may include:  Medicines to relieve pain and to help reduce the number of times that you feel the need to urinate.  Bladder training. This involves learning ways to control when you urinate, such as: ? Urinating at scheduled times. ? Training yourself to delay urination. ? Doing exercises (Kegel exercises) to strengthen the muscles that control urine flow.  Lifestyle changes, such as changing your diet or taking steps to control stress.  Use of a device that provides electrical stimulation in order to reduce pain.  A procedure that stretches your bladder by filling it with air or fluid.  Surgery. This is rare. It is only done for extreme cases if other treatments do not help.  Follow these instructions at home:  Take medicines only as directed by your health care provider.  Use bladder training techniques as directed. ? Keep a bladder diary to find out which foods, liquids, or activities make your symptoms worse. ? Use your bladder diary to schedule bathroom trips. If you are away from home, plan to be near a bathroom at each of your scheduled times. ? Make sure you urinate just before you leave the house and just before you go to bed.  Do Kegel exercises as directed by your health care provider.  Do not  drink alcohol.  Do not use any tobacco products, including cigarettes, chewing tobacco, or electronic cigarettes. If you need help quitting, ask your health care provider.  Make dietary changes as directed by your health care provider. You may need to avoid spicy foods and foods that contain a high amount of potassium.  Limit your drinking of beverages that stimulate urination. These include soda, coffee, and tea.  Keep all follow-up visits as  directed by your health care provider. This is important. Contact a health care provider if:  Your symptoms do not get better after treatment.  Your pain and discomfort are getting worse.  You have more frequent urges to urinate.  You have a fever. Get help right away if:  You are not able to control your bladder at all. This information is not intended to replace advice given to you by your health care provider. Make sure you discuss any questions you have with your health care provider. Document Released: 11/13/2003 Document Revised: 08/20/2015 Document Reviewed: 11/19/2013 Elsevier Interactive Patient Education  [employer].

## 2017-11-09 LAB — URINE CULTURE

## 2017-11-21 DIAGNOSIS — M542 Cervicalgia: Secondary | ICD-10-CM | POA: Diagnosis not present

## 2017-11-23 ENCOUNTER — Telehealth: Payer: Self-pay | Admitting: Family Medicine

## 2017-11-23 NOTE — Telephone Encounter (Signed)
Pt states she is having jaw pain and teeth pain which she thinks is r/t TMJ pain. Pt kept going back and forth on whether she wanted to talk about diclofenac or needing to be seen or wanting to go to Accupuncture. Pt then decided she would just call back if she wanted to discuss further and to disregard the phone call.

## 2017-11-28 DIAGNOSIS — N3010 Interstitial cystitis (chronic) without hematuria: Secondary | ICD-10-CM | POA: Diagnosis not present

## 2017-12-01 DIAGNOSIS — N3010 Interstitial cystitis (chronic) without hematuria: Secondary | ICD-10-CM | POA: Diagnosis not present

## 2017-12-02 ENCOUNTER — Encounter: Payer: Self-pay | Admitting: Physician Assistant

## 2017-12-02 ENCOUNTER — Ambulatory Visit (INDEPENDENT_AMBULATORY_CARE_PROVIDER_SITE_OTHER): Payer: Medicare HMO | Admitting: Physician Assistant

## 2017-12-02 VITALS — BP 107/73 | HR 77 | Temp 97.5°F | Ht 67.0 in | Wt 112.6 lb

## 2017-12-02 DIAGNOSIS — L239 Allergic contact dermatitis, unspecified cause: Secondary | ICD-10-CM

## 2017-12-02 MED ORDER — METHYLPREDNISOLONE ACETATE 80 MG/ML IJ SUSP: 80.0000 mg | Freq: Once | INTRAMUSCULAR | Status: DC

## 2017-12-02 NOTE — Progress Notes (Signed)
BP 107/73   Pulse 77   Temp (!) 97.5 F (36.4 C) (Axillary)   Ht 5\' 7"  (1.702 m)   Wt 112 lb 9.6 oz (51.1 kg)   BMI 17.64 kg/m    Subjective:    Patient ID: Kristy Garza Kitchen, female    DOB: February 19, 1951, 67 y.o.   MRN: [id number]  HPI: Kristy Garza is a 67 y.o. female presenting on 12/02/2017 for Rash (Abdomen)  Patient had an interstitial cystitis treatment 1 days ago.  She was curious if the medication that was given to her created this rash.  It is only in the pelvic area.  She does have another treatment on Monday.  We have discussed that it is possible.  She will discuss this with the urologist.  At this time it is itching significantly.  She is taking Zyrtec.  I have encouraged her to take Benadryl on top of it if needed.  If she is having significant itching to use ice.  Past Medical History:  Diagnosis Date  . Acid reflux   . Allergy   . Arthritis    ARTHRITIS IN NECK BY DR. Alroy Dust [facility]  . Asthma   . Interstitial cystitis   . Osteoporosis   . PONV (postoperative nausea and vomiting)   . Tinnitus   . Trigeminal neuralgia    Atypical trigeminal neuralgia   Relevant past medical, surgical, family and social history reviewed and updated as indicated. Interim medical history since our last visit reviewed. Allergies and medications reviewed and updated. DATA REVIEWED: CHART IN EPIC  Family History reviewed for pertinent findings.  Review of Systems  Constitutional: Negative.   HENT: Negative.   Eyes: Negative.   Respiratory: Negative.   Gastrointestinal: Negative.   Genitourinary: Negative.   Skin: Positive for color change and rash.    Allergies as of 12/02/2017      Reactions   Cefuroxime Axetil Other (See Comments)   Extreme gas   Augmentin [amoxicillin-pot Clavulanate] Diarrhea   Avelox [moxifloxacin Hcl In Nacl] Other (See Comments)   Tremors   Biaxin [clarithromycin]    Unsure of reaction   Cedax [ceftibuten] Other (See Comments)   tremors   Ciprofloxacin Other (See Comments)   Blurred vision   Clindamycin/lincomycin    tachycardia   Gabapentin    Constipation   Levofloxacin Other (See Comments)   Feels dehydrated   Lyrica [pregabalin]    Drowsiness, dry mouth   Singulair [montelukast Sodium] Other (See Comments)   Numbness and tingling in hand.   Sulfonamide Derivatives Other (See Comments)   Gi upset      Medication List        Accurate as of 12/02/17 11:32 AM. Always use your most recent med list.          acetaminophen 500 MG tablet Commonly known as:  TYLENOL Take by mouth.   albuterol 108 (90 Base) MCG/ACT inhaler Commonly known as:  PROVENTIL HFA;VENTOLIN HFA Inhale two puffs every four to six hours as needed for cough or wheeze.   ARNUITY ELLIPTA 200 MCG/ACT Aepb Generic drug:  Fluticasone Furoate TAKE 1 PUFF BY MOUTH EVERY DAY   azelastine 0.1 % nasal spray Commonly known as:  ASTELIN PLACE 2 SPRAYS INTO BOTH NOSTRILS 2 (TWO) TIMES DAILY. USE IN EACH NOSTRIL AS DIRECTED   b complex vitamins capsule Take 1 capsule by mouth daily.   benzonatate 200 MG capsule Commonly known as:  TESSALON Take 1 capsule (200 mg total)  by mouth 2 (two) times daily as needed for cough.   CITRUCEL PO Take by mouth.   dextromethorphan-guaiFENesin 30-600 MG 12hr tablet Commonly known as:  MUCINEX DM Take 1 tablet by mouth 2 (two) times daily.   fexofenadine 180 MG tablet Commonly known as:  ALLEGRA Take 180 mg by mouth daily.   GAS-X PO Take by mouth.   hyoscyamine 0.125 MG Tbdp disintergrating tablet Commonly known as:  ANASPAZ Place 0.125 mg under the tongue.   lactulose 10 GM/15ML solution Commonly known as:  CHRONULAC Take 45 mLs (30 g total) by mouth 2 (two) times daily as needed for mild constipation.   lidocaine 2 % jelly Commonly known as:  XYLOCAINE Apply topically as needed.   MIRALAX PO Take by mouth as needed.   mometasone 50 MCG/ACT nasal spray Commonly known as:  NASONEX Place 2  sprays into the nose daily.   pantoprazole 40 MG tablet Commonly known as:  PROTONIX Take 1 tablet (40 mg total) by mouth daily.   phenylephrine 10 MG Tabs tablet Commonly known as:  SUDAFED PE Take 10 mg by mouth every 4 (four) hours as needed.   PROBIOTIC PO Take by mouth daily.   ranitidine 150 MG tablet Commonly known as:  ZANTAC Take 1 tablet (150 mg total) by mouth at bedtime.   tizanidine 2 MG capsule Commonly known as:  ZANAFLEX Take 1 capsule (2 mg total) by mouth 3 (three) times daily as needed for muscle spasms.   traMADol 50 MG tablet Commonly known as:  ULTRAM Take 1 tablet (50 mg total) by mouth every 6 (six) hours as needed.   UNABLE TO FIND Take 1 capsule by mouth daily. Dv3- vitamin D 3 plus immune support          Objective:    BP 107/73   Pulse 77   Temp (!) 97.5 F (36.4 C) (Axillary)   Ht 5\' 7"  (1.702 m)   Wt 112 lb 9.6 oz (51.1 kg)   BMI 17.64 kg/m   Allergies  Allergen Reactions  . Cefuroxime Axetil Other (See Comments)    Extreme gas  . Augmentin [Amoxicillin-Pot Clavulanate] Diarrhea  . Avelox [Moxifloxacin Hcl In Nacl] Other (See Comments)    Tremors   . Biaxin [Clarithromycin]     Unsure of reaction  . Cedax [Ceftibuten] Other (See Comments)    tremors  . Ciprofloxacin Other (See Comments)    Blurred vision  . Clindamycin/Lincomycin     tachycardia  . Gabapentin     Constipation  . Levofloxacin Other (See Comments)    Feels dehydrated  . Lyrica [Pregabalin]     Drowsiness, dry mouth  . Singulair [Montelukast Sodium] Other (See Comments)    Numbness and tingling in hand.  . Sulfonamide Derivatives Other (See Comments)    Gi upset    Wt Readings from Last 3 Encounters:  12/02/17 112 lb 9.6 oz (51.1 kg)  11/08/17 114 lb 6.4 oz (51.9 kg)  11/01/17 115 lb (52.2 kg)    Physical Exam  Constitutional: She is oriented to person, place, and time. She appears well-developed and well-nourished.  HENT:  Head: Normocephalic  and atraumatic.  Eyes: Pupils are equal, round, and reactive to light. Conjunctivae and EOM are normal.  Cardiovascular: Normal rate, regular rhythm, normal heart sounds and intact distal pulses.  Pulmonary/Chest: Effort normal and breath sounds normal.  Abdominal: Soft. Bowel sounds are normal.  Neurological: She is alert and oriented to person, place, and time. She  has normal reflexes.  Skin: Skin is warm and dry. No rash noted.     Multiple small red papules on the inner thighs and pubic mons, no pustules or vesicles.  Psychiatric: She has a normal mood and affect. Her behavior is normal. Judgment and thought content normal.        Assessment & Plan:   1. Allergic dermatitis Zyrtec daily Benadryl liquid, 1/2 tsp QID for itching - methylPREDNISolone acetate (DEPO-MEDROL) injection 80 mg   Continue all other maintenance medications as listed above.  Follow up plan: No follow-ups on file.  Educational handout given for [place] PA-C [place] [address]  [place], [place] [address] [phone]   12/02/2017, 11:32 AM

## 2017-12-06 ENCOUNTER — Ambulatory Visit: Payer: Medicare HMO | Admitting: Allergy and Immunology

## 2017-12-06 ENCOUNTER — Encounter: Payer: Self-pay | Admitting: Allergy and Immunology

## 2017-12-06 VITALS — BP 102/68 | HR 72 | Resp 18

## 2017-12-06 DIAGNOSIS — L308 Other specified dermatitis: Secondary | ICD-10-CM | POA: Diagnosis not present

## 2017-12-06 DIAGNOSIS — L989 Disorder of the skin and subcutaneous tissue, unspecified: Secondary | ICD-10-CM

## 2017-12-06 NOTE — Patient Instructions (Signed)
  1.  Mometasone 0.1% ointment applied to skin lesions twice a day until resolved  2.  Drug allergy to lidocaine/heparin?  Further evaluation?

## 2017-12-06 NOTE — Progress Notes (Signed)
Follow-up Note  Referring Provider: [name], DO Primary Provider: [name], DO Date of Office Visit: 12/06/2017  Subjective:   Kristy Garza (DOB: December 02, 1950) is a 67 y.o. female who returns to the [facility] [place] on 12/06/2017 in re-evaluation of the following:  HPI: Kristy Garza presents to this clinic in evaluation of a dermatitis.  Apparently on Friday of last week she was treated with a bladder instillation with lidocaine, Kenalog, heparin, and sodium bicarbonate.  Friday night she developed a acute onset of a red raised itchy rash across her extremities and trunk.  She was seen by her primary care doctor on Saturday morning and treated with a systemic steroid injection.  She is better regarding her itchiness and the redness of her skin lesions although the number of skin lesions are the same.  She has not had any new skin lesions develop since Saturday.  She has no associated systemic or constitutional symptoms.  There was no other obvious provoking factor other than the bladder treatment that could be responsible for this issue.  She did not start any other new medications or receive any additional antibiotics other than one antibiotic in early August 2019 for a single dose.  Allergies as of 12/06/2017      Reactions   Cefuroxime Axetil Other (See Comments)   Extreme gas   Augmentin [amoxicillin-pot Clavulanate] Diarrhea   Avelox [moxifloxacin Hcl In Nacl] Other (See Comments)   Tremors   Biaxin [clarithromycin]    Unsure of reaction   Cedax [ceftibuten] Other (See Comments)   tremors   Ciprofloxacin Other (See Comments)   Blurred vision   Clindamycin/lincomycin    tachycardia   Gabapentin    Constipation   Levofloxacin Other (See Comments)   Feels dehydrated   Lyrica [pregabalin]    Drowsiness, dry mouth   Singulair [montelukast Sodium] Other (See Comments)   Numbness and tingling in hand.   Sulfonamide Derivatives Other (See Comments)     Gi upset      Medication List      acetaminophen 500 MG tablet Commonly known as:  TYLENOL Take by mouth.   albuterol 108 (90 Base) MCG/ACT inhaler Commonly known as:  PROVENTIL HFA;VENTOLIN HFA Inhale two puffs every four to six hours as needed for cough or wheeze.   ARNUITY ELLIPTA 200 MCG/ACT Aepb Generic drug:  Fluticasone Furoate TAKE 1 PUFF BY MOUTH EVERY DAY   azelastine 0.1 % nasal spray Commonly known as:  ASTELIN PLACE 2 SPRAYS INTO BOTH NOSTRILS 2 (TWO) TIMES DAILY. USE IN EACH NOSTRIL AS DIRECTED   b complex vitamins capsule Take 1 capsule by mouth daily.   benzonatate 200 MG capsule Commonly known as:  TESSALON Take 1 capsule (200 mg total) by mouth 2 (two) times daily as needed for cough.   CITRUCEL PO Take by mouth.   dextromethorphan-guaiFENesin 30-600 MG 12hr tablet Commonly known as:  MUCINEX DM Take 1 tablet by mouth 2 (two) times daily.   fexofenadine 180 MG tablet Commonly known as:  ALLEGRA Take 180 mg by mouth daily.   GAS-X PO Take by mouth.   hyoscyamine 0.125 MG Tbdp disintergrating tablet Commonly known as:  ANASPAZ Place 0.125 mg under the tongue.   lactulose 10 GM/15ML solution Commonly known as:  CHRONULAC Take 45 mLs (30 g total) by mouth 2 (two) times daily as needed for mild constipation.   lidocaine 2 % jelly Commonly known as:  XYLOCAINE Apply topically as needed.  MIRALAX PO Take by mouth as needed.   mometasone 50 MCG/ACT nasal spray Commonly known as:  NASONEX Place 2 sprays into the nose daily.   pantoprazole 40 MG tablet Commonly known as:  PROTONIX Take 1 tablet (40 mg total) by mouth daily.   phenylephrine 10 MG Tabs tablet Commonly known as:  SUDAFED PE Take 10 mg by mouth every 4 (four) hours as needed.   PROBIOTIC PO Take by mouth daily.   ranitidine 150 MG tablet Commonly known as:  ZANTAC Take 1 tablet (150 mg total) by mouth at bedtime.   tizanidine 2 MG capsule Commonly known as:   ZANAFLEX Take 1 capsule (2 mg total) by mouth 3 (three) times daily as needed for muscle spasms.   traMADol 50 MG tablet Commonly known as:  ULTRAM Take 1 tablet (50 mg total) by mouth every 6 (six) hours as needed.   UNABLE TO FIND Take 1 capsule by mouth daily. Dv3- vitamin D 3 plus immune support       Past Medical History:  Diagnosis Date  . Acid reflux   . Allergy   . Arthritis    ARTHRITIS IN NECK BY DR. Alroy Dust [facility]  . Asthma   . Interstitial cystitis   . Osteoporosis   . PONV (postoperative nausea and vomiting)   . Tinnitus   . Trigeminal neuralgia    Atypical trigeminal neuralgia    Past Surgical History:  Procedure Laterality Date  . ABDOMINAL HYSTERECTOMY  2000  . CYSTOSCOPY WITH HYDRODISTENSION AND BIOPSY N/A 06/11/2012   Procedure: CYSTOSCOPY/BIOPSY/HYDRODISTENSION with Instillation of Pyridium and Marcaine and Kenalog;  Surgeon: Ailene Rud, MD;  Location: [facility];  Service: Urology;  Laterality: N/A;  1 hour requested for this case  BLADDER BIOPSY  . ETHMOIDECTOMY  2012  . SEPTOPLASTY  1980's  . vocal cord surgery   1990's   polyp removal    Review of systems negative except as noted in HPI / PMHx or noted below:  Review of Systems  Constitutional: Negative.   HENT: Negative.   Eyes: Negative.   Respiratory: Negative.   Cardiovascular: Negative.   Gastrointestinal: Negative.   Genitourinary: Negative.   Musculoskeletal: Negative.   Skin: Negative.   Neurological: Negative.   Endo/Heme/Allergies: Negative.   Psychiatric/Behavioral: Negative.      Objective:   Vitals:   12/06/17 1058  BP: 102/68  Pulse: 72  Resp: 18  SpO2: 98%          Physical Exam  Skin: Rash (Multiple nodular erythematous lesions with evidence of excoriation involving trunk and lower extremity.  Approximately 15-20 lesions lower extremity and approximately 15-20 lesions thorax.) noted.    Diagnostics: none  Assessment and Plan:     1. Inflammatory dermatosis     1.  Mometasone 0.1% ointment applied to skin lesions twice a day until resolved  2.  Drug allergy to lidocaine/heparin?  Further evaluation?  It is not very obvious what gave rise to Morphis inflammatory dermatosis.  Certainly this could have been reactivity directed against lidocaine or heparin.  For now we will just have her utilize a topical anti-inflammatory agent on her skin to deal with the lingering inflammation.  She appears to be improving significantly since she received a systemic steroid from her primary care doctor several days ago.  If she has recrudescence of this issue in the future she will require further evaluation.  Allena Katz, MD Allergy / Immunology [place]

## 2017-12-07 ENCOUNTER — Encounter: Payer: Self-pay | Admitting: Allergy and Immunology

## 2017-12-07 ENCOUNTER — Other Ambulatory Visit: Payer: Self-pay | Admitting: Allergy and Immunology

## 2017-12-07 DIAGNOSIS — R350 Frequency of micturition: Secondary | ICD-10-CM | POA: Diagnosis not present

## 2017-12-07 DIAGNOSIS — M26629 Arthralgia of temporomandibular joint, unspecified side: Secondary | ICD-10-CM | POA: Diagnosis not present

## 2017-12-07 DIAGNOSIS — R1030 Lower abdominal pain, unspecified: Secondary | ICD-10-CM | POA: Diagnosis not present

## 2017-12-07 DIAGNOSIS — N3010 Interstitial cystitis (chronic) without hematuria: Secondary | ICD-10-CM | POA: Diagnosis not present

## 2017-12-07 MED ORDER — MOMETASONE FUROATE 0.1 % EX OINT: TOPICAL_OINTMENT | CUTANEOUS | 0 refills | Status: DC

## 2017-12-08 NOTE — Addendum Note (Signed)
Addended by: Isabel Caprice on: 12/08/2017 03:59 PM   Modules accepted: Orders

## 2017-12-29 ENCOUNTER — Encounter: Payer: Self-pay | Admitting: Family Medicine

## 2017-12-29 ENCOUNTER — Ambulatory Visit (INDEPENDENT_AMBULATORY_CARE_PROVIDER_SITE_OTHER): Payer: Medicare HMO | Admitting: Family Medicine

## 2017-12-29 VITALS — BP 100/65 | HR 76 | Temp 97.3°F | Ht 67.0 in | Wt 111.8 lb

## 2017-12-29 DIAGNOSIS — R300 Dysuria: Secondary | ICD-10-CM

## 2017-12-29 DIAGNOSIS — N390 Urinary tract infection, site not specified: Secondary | ICD-10-CM

## 2017-12-29 DIAGNOSIS — G4763 Sleep related bruxism: Secondary | ICD-10-CM | POA: Diagnosis not present

## 2017-12-29 LAB — URINALYSIS, COMPLETE
Bilirubin, UA: NEGATIVE
Glucose, UA: NEGATIVE
Ketones, UA: NEGATIVE
LEUKOCYTES UA: NEGATIVE
NITRITE UA: NEGATIVE
PH UA: 6 (ref 5.0–7.5)
Protein, UA: NEGATIVE
RBC UA: NEGATIVE
Specific Gravity, UA: <1.005 — ABNORMAL LOW (ref 1.005–1.030)
Urobilinogen, Ur: 0.2 mg/dL (ref 0.2–1.0)

## 2017-12-29 LAB — MICROSCOPIC EXAMINATION
Bacteria, UA: NONE SEEN
Epithelial Cells (non renal): NONE SEEN /hpf (ref 0–10)
RBC, UA: NONE SEEN /hpf (ref 0–2)
Renal Epithel, UA: NONE SEEN /hpf
WBC, UA: NONE SEEN /hpf (ref 0–5)

## 2017-12-29 MED ORDER — MELOXICAM 7.5 MG PO TABS: 7.5000 mg | ORAL_TABLET | Freq: Every day | ORAL | 0 refills | Status: DC

## 2017-12-29 MED ORDER — TIZANIDINE HCL 2 MG PO CAPS: 2.0000 mg | ORAL_CAPSULE | Freq: Three times a day (TID) | ORAL | 2 refills | Status: DC | PRN

## 2017-12-29 NOTE — Progress Notes (Signed)
Subjective: CC: multiple concerns PCP: [name], DO IRS:WNIOEV S Sinnett is a 67 y.o. female presenting to clinic today for:  1. Dysuria Patient with history of recurrent UTIs and interstitial cystitis.  She reports that she is been experiencing some dysuria and is unsure if she has a urinary tract infection but wanted to get it checked out.  Denies any overt hematuria, nausea, vomiting or fevers.  She is followed by urology in [place].  2.  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  She is taking 2 mg of Zanaflex at bedtime but states that it does not seem to relieve pain much.  She was previously prescribed diclofenac but discontinued the medication because it was too strong for her.  She is interested in potentially using meloxicam, which was recommended to her by an orthopedist for her neck.  She is wondering if there is an alternative muscle relaxer that may be helpful to her.  She is wearing her night guard as prescribed by her dentist.  She is also been referred to physical therapy for neck pain.  She is also taking as needed tramadol, amitriptyline and hydroxyzine.  3. Wants labs Patient is also interested in having IgM, IgG and IgA testing for Candida.  She reports recurrent UTIs as above.  Denies any thrush.  She states that she read about these tests and a naturopathic book and would like to have it checked out because it may be contributing to her symptoms of anxiety.  ROS: Per HPI  Allergies  Allergen Reactions  . Cefuroxime Axetil Other (See Comments)    Extreme gas  . Augmentin [Amoxicillin-Pot Clavulanate] Diarrhea  . Avelox [Moxifloxacin Hcl In Nacl] Other (See Comments)    Tremors   . Biaxin [Clarithromycin]     Unsure of reaction  . Cedax [Ceftibuten] Other (See Comments)    tremors  . Ciprofloxacin Other (See Comments)    Blurred vision  . Clindamycin/Lincomycin     tachycardia  . Gabapentin     Constipation  . Levofloxacin Other (See Comments)    Feels dehydrated  . Lyrica [Pregabalin]     Drowsiness, dry mouth  . Singulair [Montelukast Sodium] Other (See Comments)    Numbness and tingling in hand.  . Sulfonamide Derivatives Other (See Comments)    Gi upset   Past Medical History:  Diagnosis Date  . Acid reflux   . Allergy   . Arthritis    ARTHRITIS IN NECK BY DR. Alroy Dust [facility]  . Asthma   . Interstitial cystitis   . Osteoporosis   . PONV (postoperative nausea and vomiting)   . Tinnitus   . Trigeminal neuralgia    Atypical trigeminal neuralgia    Current Outpatient Medications:  .  acetaminophen (TYLENOL) 500 MG tablet, Take by mouth., Disp: , Rfl:  .  albuterol (PROVENTIL HFA;VENTOLIN HFA) 108 (90 Base) MCG/ACT inhaler, INHALE TWO PUFFS EVERY FOUR TO SIX HOURS AS NEEDED FOR COUGH OR WHEEZE., Disp: 18 g, Rfl: 1 .  ARNUITY ELLIPTA 200 MCG/ACT AEPB, TAKE 1 PUFF BY MOUTH EVERY DAY, Disp: 30 each, Rfl: 2 .  azelastine (ASTELIN) 0.1 % nasal spray, PLACE 2 SPRAYS INTO BOTH NOSTRILS 2 (TWO) TIMES DAILY. USE IN EACH NOSTRIL AS DIRECTED, Disp: 30 mL, Rfl: 2 .  b complex vitamins capsule, Take 1 capsule by mouth daily., Disp: , Rfl:  .  benzonatate (TESSALON) 200 MG capsule, Take 1 capsule (200 mg total) by  mouth 2 (two) times daily as needed for cough., Disp: 40 capsule, Rfl: 0 .  dextromethorphan-guaiFENesin (MUCINEX DM) 30-600 MG 12hr tablet, Take 1 tablet by mouth 2 (two) times daily., Disp: , Rfl:  .  fexofenadine (ALLEGRA) 180 MG tablet, Take 180 mg by mouth daily., Disp: , Rfl:  .  hyoscyamine (ANASPAZ) 0.125 MG TBDP disintergrating tablet, Place 0.125 mg under the tongue., Disp: , Rfl:  .  lactulose (CHRONULAC) 10 GM/15ML solution, Take 45 mLs (30 g total) by mouth 2 (two) times daily as needed for mild constipation., Disp: 240 mL, Rfl: 0 .  lidocaine (XYLOCAINE) 2 % jelly, Apply topically as needed., Disp: 30 mL, Rfl: 10 .  Methylcellulose, Laxative, (CITRUCEL PO),  Take by mouth., Disp: , Rfl:  .  mometasone (ELOCON) 0.1 % ointment, 1 application to skin lesions twice daily until resolved., Disp: 45 g, Rfl: 0 .  mometasone (NASONEX) 50 MCG/ACT nasal spray, Place 2 sprays into the nose daily., Disp: 17 g, Rfl: 5 .  pantoprazole (PROTONIX) 40 MG tablet, Take 1 tablet (40 mg total) by mouth daily., Disp: 30 tablet, Rfl: 5 .  phenylephrine (SUDAFED PE) 10 MG TABS tablet, Take 10 mg by mouth every 4 (four) hours as needed., Disp: , Rfl:  .  Polyethylene Glycol 3350 (MIRALAX PO), Take by mouth as needed., Disp: , Rfl:  .  Probiotic Product (PROBIOTIC PO), Take by mouth daily., Disp: , Rfl:  .  ranitidine (ZANTAC) 150 MG tablet, Take 1 tablet (150 mg total) by mouth at bedtime., Disp: 60 tablet, Rfl: 5 .  Simethicone (GAS-X PO), Take by mouth., Disp: , Rfl:  .  tizanidine (ZANAFLEX) 2 MG capsule, Take 1 capsule (2 mg total) by mouth 3 (three) times daily as needed for muscle spasms., Disp: 60 capsule, Rfl: 2 .  traMADol (ULTRAM) 50 MG tablet, Take 1 tablet (50 mg total) by mouth every 6 (six) hours as needed., Disp: 30 tablet, Rfl: 1 .  UNABLE TO FIND, Take 1 capsule by mouth daily. Dv3- vitamin D 3 plus immune support, Disp: , Rfl:   Current Facility-Administered Medications:  .  methylPREDNISolone acetate (DEPO-MEDROL) injection 80 mg, 80 mg, Intramuscular, Once, Terald Sleeper, PA-C Social History   Socioeconomic History  . Marital status: Married    Spouse name: Not on file  . Number of children: 3  . Years of education: 51  . Highest education level: Some college, no degree  Occupational History  . Occupation: CNA    Comment: Retired  Scientific laboratory technician  . Financial resource strain: Not on file  . Food insecurity:    Worry: Never true    Inability: Never true  . Transportation needs:    Medical: No    Non-medical: No  Tobacco Use  . Smoking status: Never Smoker  . Smokeless tobacco: Never Used  Substance and Sexual Activity  . Alcohol use: No     Alcohol/week: 0.0 standard drinks    Comment: 01-19-2016 per pt no  . Drug use: No    Comment: 01-19-2016 per pt no   . Sexual activity: Never  Lifestyle  . Physical activity:    Days per week: 0 days    Minutes per session: 0 min  . Stress: Not at all  Relationships  . Social connections:    Talks on phone: More than three times a week    Gets together: More than three times a week    Attends religious service: Never    Active  member of club or organization: No    Attends meetings of clubs or organizations: Never    Relationship status: Married  . Intimate partner violence:    Fear of current or ex partner: No    Emotionally abused: No    Physically abused: No    Forced sexual activity: No  Other Topics Concern  . Not on file  Social History Narrative   Lives with husband.    Family History  Problem Relation Age of Onset  . Hyperlipidemia Mother   . Arthritis Mother   . Cancer Mother   . Lymphoma Mother   . Cancer Father   . Allergies Father   . Allergies Sister   . Allergies Sister   . Allergic rhinitis Neg Hx   . Angioedema Neg Hx   . Asthma Neg Hx   . Eczema Neg Hx   . Immunodeficiency Neg Hx   . Urticaria Neg Hx     Objective: Office vital signs reviewed. BP 100/65   Pulse 76   Temp (!) 97.3 F (36.3 C) (Oral)   Ht 5\' 7"  (1.702 m)   Wt 111 lb 12.8 oz (50.7 kg)   BMI 17.51 kg/m   Physical Examination:  General: Awake, alert, thin, No acute distress HEENT: Normal Jaw: Patient able to open her mouth for finger breaths wide.  She has a palpable click in the right TMJ.  She seems to have less motion in the left TMJ.  Results for orders placed or performed in visit on 12/29/17 (from the past 24 hour(s))  Urinalysis, Complete     Status: Abnormal   Collection Time: 12/29/17  4:31 PM  Result Value Ref Range   Specific Gravity, UA <1.005 (L) 1.005 - 1.030   pH, UA 6.0 5.0 - 7.5   Color, UA Yellow Yellow   Appearance Ur Clear Clear   Leukocytes, UA  Negative Negative   Protein, UA Negative Negative/Trace   Glucose, UA Negative Negative   Ketones, UA Negative Negative   RBC, UA Negative Negative   Bilirubin, UA Negative Negative   Urobilinogen, Ur 0.2 0.2 - 1.0 mg/dL   Nitrite, UA Negative Negative   Microscopic Examination See below:    Narrative   Performed at:  [address] [place], [place]  [id number] Lab Director: Colletta Maryland [facility], Phone:  [phone]  Microscopic Examination     Status: None   Collection Time: 12/29/17  4:31 PM  Result Value Ref Range   WBC, UA None seen 0 - 5 /hpf   RBC, UA None seen 0 - 2 /hpf   Epithelial Cells (non renal) None seen 0 - 10 /hpf   Renal Epithel, UA None seen None seen /hpf   Bacteria, UA None seen None seen/Few   Narrative   Performed at:  [facility] [address] [place], [place]  [id number] Lab Director: Colletta Maryland [facility], Phone:  [phone]   Assessment/ Plan: 67 y.o. female   1. Dysuria Patient with no evidence of urinary tract infection on exam or on urinalysis.  Per her request, I have ordered the candidal labs though I did discuss with her that this may not be covered by insurance, particularly since they are elective labs.  She voiced good understanding and wished to proceed with lab collection.  I have encouraged her to follow-up with her urologist for ongoing symptoms.  I suspect these are related to her interstitial cystitis. - Urinalysis, Complete -  Candida Antibodies IgA - Candida Antibodies IgG - Candida Antibodies IgM  2. Bruxism, sleep-related I have recommended increasing Zanaflex to 4 mg up to 3 times daily if needed.  We discussed use of Aleve versus meloxicam.  She will proceed with use of OTC Aleve first and if tolerated we will continue that medicine.  If not tolerated, she was given a written prescription for meloxicam 7.5 mg.  I have instructed her avoid other NSAIDs and take medication with food and plenty of  water.  She will follow-up PRN for this issue.  3. Recurrent UTI - Candida Antibodies IgA - Candida Antibodies IgG - Candida Antibodies IgM   Orders Placed This Encounter  Procedures  . Microscopic Examination  . Urinalysis, Complete  . Candida Antibodies IgA  . Candida Antibodies IgG  . Candida Antibodies IgM   Meds ordered this encounter  Medications  . tizanidine (ZANAFLEX) 2 MG capsule    Sig: Take 1-2 capsules (2-4 mg total) by mouth 3 (three) times daily as needed for muscle spasms.    Dispense:  60 capsule    Refill:  2  . meloxicam (MOBIC) 7.5 MG tablet    Sig: Take 1 tablet (7.5 mg total) by mouth daily. (prn pain)    Dispense:  30 tablet    Refill:  0     Kayline Sheer Windell Moulding, DO [place] [phone]

## 2017-12-29 NOTE — Patient Instructions (Signed)
You have prescribed a nonsteroidal anti-inflammatory drug (NSAID) today. This will help with your pain and inflammation. Please do not take any other NSAIDs (ibuprofen/Motrin/Advil, naproxen/Aleve, meloxicam/Mobic, Voltaren/diclofenac). Please make sure to eat a meal when taking this medication.   Caution:  If you have a history of acid reflux/indigestion, I recommend that you take an antacid (such as Prilosec, Prevacid) daily while on the NSAID.  If you have a history of bleeding disorder, gastric ulcer, are on a blood thinner (like warfarin/Coumadin, Xarelto, Eliquis, etc) please do not take NSAID.  If you have ever had a heart attack, you should not take NSAIDs.  I have sent in a new prescription for the muscle relaxer reflecting the ability to take 2 if needed.  Try aleve first for pain.  If this does not help, ok to discontinue and switch to Meloxicam.  Continue the Pantoprazole while taking the NSAID.

## 2018-01-01 LAB — CANDIDA ANTIBODIES IGA: CANDIDA ANTIBODIES IGA: 30 U/mL — AB (ref 0–9)

## 2018-01-01 LAB — CANDIDA ANTIBODIES IGM: Candida Antibodies IgM: 14 U/mL — ABNORMAL HIGH (ref 0–9)

## 2018-01-01 LAB — CANDIDA ANTIBODIES IGG: CANDIDA ANTIBODIES IGG: 30 U/mL — AB (ref 0–29)

## 2018-01-04 ENCOUNTER — Encounter: Payer: Self-pay | Admitting: Family Medicine

## 2018-01-04 ENCOUNTER — Ambulatory Visit (INDEPENDENT_AMBULATORY_CARE_PROVIDER_SITE_OTHER): Payer: Medicare HMO | Admitting: Family Medicine

## 2018-01-04 ENCOUNTER — Other Ambulatory Visit: Payer: Self-pay | Admitting: Family Medicine

## 2018-01-04 VITALS — BP 112/72 | HR 95 | Temp 101.1°F | Ht 67.0 in | Wt 111.0 lb

## 2018-01-04 DIAGNOSIS — J029 Acute pharyngitis, unspecified: Secondary | ICD-10-CM

## 2018-01-04 DIAGNOSIS — R110 Nausea: Secondary | ICD-10-CM | POA: Diagnosis not present

## 2018-01-04 DIAGNOSIS — R5081 Fever presenting with conditions classified elsewhere: Secondary | ICD-10-CM

## 2018-01-04 MED ORDER — ONDANSETRON HCL 4 MG PO TABS: 4.0000 mg | ORAL_TABLET | Freq: Three times a day (TID) | ORAL | 0 refills | Status: DC | PRN

## 2018-01-04 MED ORDER — AMOXICILLIN 500 MG PO CAPS: 500.0000 mg | ORAL_CAPSULE | Freq: Two times a day (BID) | ORAL | 0 refills | Status: AC

## 2018-01-04 NOTE — Progress Notes (Addendum)
  Subjective:     Kristy Garza is a 67 y.o. female who presents for evaluation of sore throat. Associated symptoms include fevers up to 102 degrees, chills, enlarged tonsils, headache, myalgias, pain while swallowing, post nasal drip, productive cough, sore throat and swollen glands. Onset of symptoms was 3 days ago, and have been unchanged since that time. She is drinking plenty of fluids. She has had a recent close exposure to someone with proven streptococcal pharyngitis.  The following portions of the patient's history were reviewed and updated as appropriate: allergies, current medications, past family history, past medical history, past social history, past surgical history and problem list.  Review of Systems Constitutional: positive for chills, fatigue and fevers Eyes: negative Ears, nose, mouth, throat, and face: positive for hoarseness, nasal congestion and sore throat Respiratory: positive for cough Cardiovascular: negative Gastrointestinal: positive for nausea Hematologic/lymphatic: positive for lymphadenopathy Neurological: positive for headaches Behavioral/Psych: negative    Objective:    General appearance: alert, cooperative and mild distress Head: Normocephalic, without obvious abnormality, atraumatic Eyes: conjunctivae/corneas clear. PERRL, EOM's intact. Fundi benign. Ears: normal TM's and external ear canals both ears Nose: Nares normal. Septum midline. Mucosa normal. No drainage or sinus tenderness., turbinates red, swollen Throat: lips and tongue normal; teeth and gums normal, oropharynx erythema with exudate present, cobblestoning of posterior oropharynx.   Neck: mild anterior cervical adenopathy, no carotid bruit, no JVD, supple, symmetrical, trachea midline and thyroid not enlarged, symmetric, no tenderness/mass/nodules Lungs: clear to auscultation bilaterally Heart: regular rate and rhythm, S1, S2 normal, no murmur, click, rub or gallop Abdomen: soft,  non-tender; bowel sounds normal; no masses,  no organomegaly Skin: Skin color, texture, turgor normal. No rashes or lesions Lymph nodes: Cervical adenopathy: anterior  Laboratory Strep test done. Results:negative.   Influenza A & B negative  Assessment:    1. Fever in other diseases Tylenol or motrin as needed for fever and pain control.   2. Nausea Likely due to pharyngitis and postnasal drainage. Abdomen without tenderness or distention. No vomiting, diarrhea, or constipation.  Bland diet, advance as tolerated. Medications as prescribed.  - ondansetron (ZOFRAN) 4 MG tablet; Take 1 tablet (4 mg total) by mouth every 8 (eight) hours as needed for nausea or vomiting.  Dispense: 20 tablet; Refill: 0  3. Acute pharyngitis, unspecified etiology Step negative but due to recent exposure to strep, appearance of oropharynx, and presence of fever, pt empirically treated.  Pharyngitis care instructions provided. Pt to change toothbrush in 3 and 7 days. Pt aware of infection control. Pt has intolerances to several antibiotics. Pt educated on signs and symptoms of a true allergic reaction to the antibiotic and aware of what to do if this happens.  - amoxicillin (AMOXIL) 500 MG capsule; Take 1 capsule (500 mg total) by mouth 2 (two) times daily for 10 days.  Dispense: 20 capsule; Refill: 0    Plan:    Patient placed on antibiotics. Use of OTC analgesics recommended as well as salt water gargles. Use of decongestant recommended. Patient advised of the risk of peritonsillar abscess formation. Patient advised that he will be infectious for 24 hours after starting antibiotics. Follow up as needed.    Monia Pouch, FNP-C [facility]

## 2018-01-04 NOTE — Patient Instructions (Addendum)
Nausea, Adult Feeling sick to your stomach (nausea) means that your stomach is upset or you feel like you have to throw up (vomit). Feeling sick to your stomach is usually not serious, but it may be an early sign of a more serious medical problem. As you feel sicker to your stomach, it can lead to throwing up (vomiting). If you throw up, or if you are not able to drink enough fluids, there is a risk of dehydration. Dehydration can make you feel tired and thirsty, have a dry mouth, and pee (urinate) less often. Older adults and people who have other diseases or a weak defense (immune) system have a higher risk of dehydration. The main goal of treating this condition is to:  Limit how often you feel sick to your stomach.  Prevent throwing up and dehydration.  Follow these instructions at home: Follow instructions from your doctor about how to care for yourself at home. Eating and drinking Follow these recommendations as told by your doctor:  Take an oral rehydration solution (ORS). This is a drink that is sold at pharmacies and stores.  Drink clear fluids in small amounts as you are able, such as: ? Water. ? Ice chips. ? Fruit juice that has water added (diluted fruit juice). ? Low-calorie sports drinks.  Eat bland, easy to digest foods in small amounts as you are able, such as: ? Bananas. ? Applesauce. ? Rice. ? Lean meats. ? Toast. ? Crackers.  Avoid drinking fluids that contain a lot of sugar or caffeine.  Avoid alcohol.  Avoid spicy or fatty foods.  General instructions  Drink enough fluid to keep your pee (urine) clear or pale yellow.  Wash your hands often. If you cannot use soap and water, use hand sanitizer.  Make sure that all people in your household wash their hands well and often.  Rest at home while you get better.  Take over-the-counter and prescription medicines only as told by your doctor.  Breathe slowly and deeply when you feel sick to your  stomach.  Watch your condition for any changes.  Keep all follow-up visits as told by your doctor. This is important. Contact a doctor if:  You have a headache.  You have new symptoms.  You feel sicker to your stomach.  You have a fever.  You feel light-headed or dizzy.  You throw up.  You are not able to keep fluids down. Get help right away if:  You have pain in your chest, neck, arm, or jaw.  You feel very weak or you pass out (faint).  You have throw up that is bright red or looks like coffee grounds.  You have bloody or black poop (stools), or poop that looks like tar.  You have a very bad headache, a stiff neck, or both.  You have very bad pain, cramping, or bloating in your belly.  You have a rash.  You have trouble breathing or you are breathing very quickly.  Your heart is beating very quickly.  Your skin feels cold and clammy.  You feel confused.  You have pain while peeing.  You have signs of dehydration, such as: ? Dark pee, or very little or no pee. ? Cracked lips. ? Dry mouth. ? Sunken eyes. ? Sleepiness. ? Weakness. These symptoms may be an emergency. Do not wait to see if the symptoms will go away. Get medical help right away. Call your local emergency services (911 in the [place] Do not drive yourself to   the hospital. This information is not intended to replace advice given to you by your health care provider. Make sure you discuss any questions you have with your health care provider. Document Released: 03/03/2011 Document Revised: 08/20/2015 Document Reviewed: 11/18/2014 Elsevier Interactive Patient Education  2018 [place]. Pharyngitis Pharyngitis is a sore throat (pharynx). There is redness, pain, and swelling of your throat. Follow these instructions at home:  Drink enough fluids to keep your pee (urine) clear or pale yellow.  Only take medicine as told by your doctor. ? You may get sick again if you do not take medicine as  told. Finish your medicines, even if you start to feel better. ? Do not take aspirin.  Rest.  Rinse your mouth (gargle) with salt water ( tsp of salt per 1 qt of water) every 1-2 hours. This will help the pain.  If you are not at risk for choking, you can suck on hard candy or sore throat lozenges. Contact a doctor if:  You have large, tender lumps on your neck.  You have a rash.  You cough up green, yellow-brown, or bloody spit. Get help right away if:  You have a stiff neck.  You drool or cannot swallow liquids.  You throw up (vomit) or are not able to keep medicine or liquids down.  You have very bad pain that does not go away with medicine.  You have problems breathing (not from a stuffy nose). This information is not intended to replace advice given to you by your health care provider. Make sure you discuss any questions you have with your health care provider. Document Released: 08/31/2007 Document Revised: 08/20/2015 Document Reviewed: 11/19/2012 Elsevier Interactive Patient Education  2017 [employer].

## 2018-01-05 ENCOUNTER — Other Ambulatory Visit: Payer: Self-pay | Admitting: *Deleted

## 2018-01-05 ENCOUNTER — Other Ambulatory Visit: Payer: Self-pay | Admitting: Allergy & Immunology

## 2018-01-05 MED ORDER — AZELASTINE HCL 0.1 % NA SOLN: 2.0000 | Freq: Two times a day (BID) | NASAL | 2 refills | Status: DC

## 2018-01-07 ENCOUNTER — Other Ambulatory Visit: Payer: Self-pay | Admitting: Family

## 2018-01-07 DIAGNOSIS — R053 Chronic cough: Secondary | ICD-10-CM

## 2018-01-07 DIAGNOSIS — R05 Cough: Secondary | ICD-10-CM

## 2018-01-08 ENCOUNTER — Telehealth: Payer: Self-pay | Admitting: Family Medicine

## 2018-01-08 NOTE — Telephone Encounter (Signed)
Last seen 01/04/18  Dr Darnell Level

## 2018-01-09 ENCOUNTER — Encounter: Payer: Self-pay | Admitting: Pulmonary Disease

## 2018-01-09 ENCOUNTER — Other Ambulatory Visit (INDEPENDENT_AMBULATORY_CARE_PROVIDER_SITE_OTHER): Payer: Medicare HMO

## 2018-01-09 ENCOUNTER — Ambulatory Visit: Payer: Medicare HMO | Admitting: Pulmonary Disease

## 2018-01-09 VITALS — BP 110/64 | HR 72 | Temp 97.6°F | Ht 69.0 in | Wt 112.0 lb

## 2018-01-09 DIAGNOSIS — J029 Acute pharyngitis, unspecified: Secondary | ICD-10-CM | POA: Insufficient documentation

## 2018-01-09 DIAGNOSIS — J020 Streptococcal pharyngitis: Secondary | ICD-10-CM

## 2018-01-09 DIAGNOSIS — J301 Allergic rhinitis due to pollen: Secondary | ICD-10-CM

## 2018-01-09 LAB — BETA STREP SCREEN: STREPTOCOCCUS, GROUP A SCREEN (DIRECT): NEGATIVE

## 2018-01-09 MED ORDER — BENZONATATE 200 MG PO CAPS: 200.0000 mg | ORAL_CAPSULE | Freq: Three times a day (TID) | ORAL | 1 refills | Status: DC | PRN

## 2018-01-09 NOTE — Assessment & Plan Note (Signed)
Swab for strep at lab >>> Sent for culture  Continue amoxicillin at that time  Review literature listed below  Continue Arnuity Ellipta  Get scheduled for first available with Dr. Annamaria Boots

## 2018-01-09 NOTE — Assessment & Plan Note (Signed)
Continue Allegra  Could consider switch to Zyrtec if feeling Allegra is not working well for you Continue Nasonex  Continue nasal saline rinses

## 2018-01-09 NOTE — Progress Notes (Signed)
@Patient  ID: [name], female    DOB: [birth year], 67 y.o.   MRN: [id number]  Chief Complaint  Patient presents with  . Acute Visit    Sob, Cough    Referring provider: [name], DO  HPI:  67 year old female never smoker (father) followed for chronic cough, allergic rhinitis, COPD by CT (normal A1 AT)  PMH: Asthma, GERD, IBS, trigeminal neuralgia, TMJ, Anxiety Smoker/ Smoking History: Never smoker Maintenance: Arnuity Ellipta Pt of: Dr. Annamaria Boots  01/09/2018  - Visit    67 year old female patient presenting today for acute visit.  Patient reports she had symptoms that started on 01/01/2018 which was severe sore throat.  Patient was seen by primary care in 01/04/2018.  Patient was started on amoxicillin.  Patient reports that she was swabbed in the office and strep and flu were both negative.  Patient reports that symptoms have slowly started to improve since being seen by PCP.  Patient reports that she is slowly improving but is not back to being 100%.  Patient is wondering if she could use [place] herbs to help improve symptoms.       Tests:  CXR 01/30/13-Hyperinflation and severe emphysema is present Allergy profile 12/28/2012 negative with total IgE 4.5. a1AT 02/07/13- WNL MM, 167 allergy skin testing 08/28/13-Positive- grass, tree, weed, dust. Office spirometry wnl 04/27/16/ Dr Neldon Mc  FENO:  No results found for: NITRICOXIDE  PFT: No flowsheet data found.  Imaging: No results found.  Chart Review:    Specialty Problems      Pulmonary Problems   Seasonal allergic rhinitis    allergy skin testing 08/28/13-Positive- grass, tree, weed, dust.       Congenital glottic web of larynx   Asthma, chronic   Chronic bronchitis (HCC)   Pharyngitis      Allergies  Allergen Reactions  . Cefuroxime Axetil Other (See Comments)    Extreme gas  . Gabapentin Other (See Comments)    Constipation Constipation  . Pregabalin Other (See Comments)   Drowsiness, dry mouth Drowsiness, dry mouth Makes her tired and thirsty  . Augmentin [Amoxicillin-Pot Clavulanate] Diarrhea  . Avelox [Moxifloxacin Hcl In Nacl] Other (See Comments)    Tremors   . Biaxin [Clarithromycin]     Unsure of reaction  . Cedax [Ceftibuten] Other (See Comments)    tremors  . Ciprofloxacin Other (See Comments) and Nausea And Vomiting    Blurred vision Blurred vision Pt can't remember reaction  . Clindamycin/Lincomycin     tachycardia  . Levofloxacin Other (See Comments)    Feels dehydrated  . Lincomycin Other (See Comments)    tachycardia  . Montelukast Sodium Other (See Comments)    Numbness and tingling in hand. Numbness and tingling in hand.  . Sulfonamide Derivatives Other (See Comments)    Gi upset    Immunization History  Administered Date(s) Administered  . Influenza,inj,Quad PF,6+ Mos 12/28/2012, 12/25/2013, 12/26/2014  . Pneumococcal Conjugate-13 02/07/2013  . Pneumococcal Polysaccharide-23 09/07/2017  . Tdap 05/21/2010  . Zoster 03/27/2012    Past Medical History:  Diagnosis Date  . Acid reflux   . Allergy   . Arthritis    ARTHRITIS IN NECK BY DR. Alroy Dust [facility]  . Asthma   . Interstitial cystitis   . Osteoporosis   . PONV (postoperative nausea and vomiting)   . Tinnitus   . Trigeminal neuralgia    Atypical trigeminal neuralgia    Tobacco History: Social History   Tobacco Use  Smoking Status Never Smoker  Smokeless Tobacco Never Used   Counseling given: Yes   Outpatient Encounter Medications as of 01/09/2018  Medication Sig  . acetaminophen (TYLENOL) 500 MG tablet Take by mouth.  [name] albuterol (PROVENTIL HFA;VENTOLIN HFA) 108 (90 Base) MCG/ACT inhaler INHALE TWO PUFFS EVERY FOUR TO SIX HOURS AS NEEDED FOR COUGH OR WHEEZE.  [name] amitriptyline (ELAVIL) 10 MG tablet TAKE 1 TABLET BY MOUTH EVERY DAY AT NIGHT  . amoxicillin (AMOXIL) 500 MG capsule Take 1 capsule (500 mg total) by mouth 2 (two) times daily for 10 days.  .  ARNUITY ELLIPTA 200 MCG/ACT AEPB TAKE 1 PUFF BY MOUTH EVERY DAY  . azelastine (ASTELIN) 0.1 % nasal spray Place 2 sprays into both nostrils 2 (two) times daily. Use in each nostril as directed  . b complex vitamins capsule Take 1 capsule by mouth daily.  . benzonatate (TESSALON) 200 MG capsule TAKE 1 CAPSULE (200 MG TOTAL) BY MOUTH 2 (TWO) TIMES DAILY AS NEEDED FOR COUGH.  . bisacodyl (DULCOLAX) 5 MG EC tablet Take 5 mg by mouth daily as needed for moderate constipation.  . calcium citrate-vitamin D (CITRACAL+D) 315-200 MG-UNIT tablet Take 2 tablets by mouth 2 (two) times daily.  [name] dextromethorphan-guaiFENesin (MUCINEX DM) 30-600 MG 12hr tablet Take 1 tablet by mouth 2 (two) times daily.  . Ergocalciferol (VITAMIN D2) 2000 units TABS Take 2,000 Units by mouth daily.  . fexofenadine (ALLEGRA) 180 MG tablet Take 180 mg by mouth daily.  . hydrOXYzine (ATARAX/VISTARIL) 25 MG tablet TAKE 1 TABLET 1 HOUR BEFORE SLEEP  . hyoscyamine (ANASPAZ) 0.125 MG TBDP disintergrating tablet Place 0.125 mg under the tongue.  . lactulose (CHRONULAC) 10 GM/15ML solution Take 45 mLs (30 g total) by mouth 2 (two) times daily as needed for mild constipation.  . lidocaine (XYLOCAINE) 2 % jelly Apply topically as needed.  . lidocaine (XYLOCAINE) 5 % ointment Apply daily as needed  . magnesium oxide (MAG-OX) 400 MG tablet Take by mouth.  . meloxicam (MOBIC) 7.5 MG tablet Take 1 tablet (7.5 mg total) by mouth daily. (prn pain)  . Methylcellulose, Laxative, (CITRUCEL PO) Take by mouth.  . mometasone (ELOCON) 0.1 % ointment 1 application to skin lesions twice daily until resolved.  . mometasone (NASONEX) 50 MCG/ACT nasal spray Place 2 sprays into the nose daily.  . ondansetron (ZOFRAN) 4 MG tablet Take 1 tablet (4 mg total) by mouth every 8 (eight) hours as needed for nausea or vomiting.  . pantoprazole (PROTONIX) 40 MG tablet Take 1 tablet (40 mg total) by mouth daily.  . phenylephrine (SUDAFED PE) 10 MG TABS tablet Take 10  mg by mouth every 4 (four) hours as needed.  . Polyethylene Glycol 3350 (MIRALAX PO) Take by mouth as needed.  . Probiotic Product (PROBIOTIC PO) Take by mouth daily.  . Simethicone (GAS-X PO) Take by mouth.  . tizanidine (ZANAFLEX) 2 MG capsule Take 1-2 capsules (2-4 mg total) by mouth 3 (three) times daily as needed for muscle spasms.  . traMADol (ULTRAM) 50 MG tablet Take 1 tablet (50 mg total) by mouth every 6 (six) hours as needed.  [name] UNABLE TO FIND Take 1 capsule by mouth daily. Dv3- vitamin D 3 plus immune support  . benzonatate (TESSALON) 200 MG capsule Take 1 capsule (200 mg total) by mouth 3 (three) times daily as needed for cough.   Facility-Administered Encounter Medications as of 01/09/2018  Medication  . methylPREDNISolone acetate (DEPO-MEDROL) injection 80 mg    Review of Systems  Review of Systems  Constitutional: Positive for fatigue. Negative for chills, fever and unexpected weight change.  HENT: Positive for congestion, postnasal drip and sore throat (slightly improved ). Negative for ear pain, sinus pressure and sinus pain.        Jaw pain - TMJ chronic   Respiratory: Positive for cough (more dry cough ) and shortness of breath. Negative for chest tightness and wheezing.   Cardiovascular: Negative for chest pain and palpitations.  Gastrointestinal: Negative for blood in stool, diarrhea, nausea and vomiting.  Genitourinary: Negative for dysuria, frequency and urgency.  Musculoskeletal: Negative for arthralgias.  Skin: Negative for color change.  Allergic/Immunologic: Negative for environmental allergies and food allergies.  Neurological: Negative for dizziness, light-headedness and headaches.  Psychiatric/Behavioral: Negative for dysphoric mood. The patient is not nervous/anxious.   All other systems reviewed and are negative.    Physical Exam  BP 110/64 (BP Location: Left Arm, Cuff Size: Normal)   Pulse 72   Temp 97.6 F (36.4 C) (Oral)   Ht 5\' 9"  (1.753 m)    Wt 112 lb (50.8 kg)   SpO2 100%   BMI 16.54 kg/m    Wt Readings from Last 5 Encounters:  01/09/18 112 lb (50.8 kg)  01/04/18 111 lb (50.3 kg)  12/29/17 111 lb 12.8 oz (50.7 kg)  12/02/17 112 lb 9.6 oz (51.1 kg)  11/08/17 114 lb 6.4 oz (51.9 kg)     Physical Exam  Constitutional: She is oriented to person, place, and time and well-developed, well-nourished, and in no distress. No distress.  HENT:  Head: Normocephalic and atraumatic.  Right Ear: Hearing, external ear and ear canal normal.  Left Ear: Hearing, external ear and ear canal normal.  Nose: Mucosal edema present. Right sinus exhibits no maxillary sinus tenderness and no frontal sinus tenderness. Left sinus exhibits no maxillary sinus tenderness and no frontal sinus tenderness.  Mouth/Throat: Uvula is midline. Oropharyngeal exudate (small exudate in upper left throat ), posterior oropharyngeal edema and posterior oropharyngeal erythema present.  +TMs with effusion bilaterally   Eyes: Pupils are equal, round, and reactive to light.  Neck: Normal range of motion. Neck supple. No JVD present.  Cardiovascular: Normal rate, regular rhythm and normal heart sounds.  Pulmonary/Chest: Effort normal and breath sounds normal. No accessory muscle usage. No respiratory distress. She has no decreased breath sounds. She has no wheezes. She has no rhonchi.  Abdominal: Soft. Bowel sounds are normal. There is no tenderness.  Musculoskeletal: Normal range of motion. She exhibits no edema.  Lymphadenopathy:       Head (right side): Tonsillar adenopathy present.       Head (left side): Tonsillar adenopathy present.    She has no cervical adenopathy.  Neurological: She is alert and oriented to person, place, and time. Gait normal.  Skin: Skin is warm and dry. She is not diaphoretic. No erythema.  Psychiatric: Mood, memory, affect and judgment normal.  Nursing note and vitals reviewed.    Lab Results:  CBC    Component Value Date/Time    WBC 3.1 (L) 06/12/2017 1134   WBC 4.9 06/16/2014 1338   WBC 5.9 10/05/2012 1228   RBC 3.87 06/12/2017 1134   RBC 3.82 (A) 06/16/2014 1338   RBC 4.07 10/05/2012 1228   RBC 4.07 10/05/2012 1228   HGB 11.5 06/12/2017 1134   HCT 35.2 06/12/2017 1134   PLT 208 06/12/2017 1134   MCV 91 06/12/2017 1134   MCH 29.7 06/12/2017 1134   MCH 30.3 06/16/2014 1338   MCH  29.7 10/05/2012 1228   MCHC 32.7 06/12/2017 1134   MCHC 32.4 06/16/2014 1338   MCHC 33.8 10/05/2012 1228   RDW 12.5 06/12/2017 1134   LYMPHSABS 1.0 06/12/2017 1134   MONOABS 1.0 06/23/2011 0110   EOSABS 0.1 06/12/2017 1134   BASOSABS 0.0 06/12/2017 1134    BMET    Component Value Date/Time   NA 141 06/12/2017 1134   K 4.4 06/12/2017 1134   CL 103 06/12/2017 1134   CO2 22 06/12/2017 1134   GLUCOSE 81 06/12/2017 1134   GLUCOSE 95 03/25/2013 1052   BUN 12 06/12/2017 1134   CREATININE 0.82 06/12/2017 1134   CALCIUM 9.5 06/12/2017 1134   GFRNONAA 75 06/12/2017 1134   GFRAA 86 06/12/2017 1134    BNP No results found for: BNP  ProBNP No results found for: PROBNP    Assessment & Plan:   Pleasant 67 year old patient seen today for office visit.  I do not believe the patient has bacterial pharyngitis but will rule this out with strep culture as well as swab.  Unfortunately do not see a flu swab or strep swab in our system.  I suspect the patient has viral pharyngitis and is slowly improving based off of her assessment as well as her interview and the information she provided for me.  Patient to continue amoxicillin at this time.  I do not believe the patient needs additional antibiotics or chest x-ray today.  Patient to be scheduled with first follow-up with Dr. Annamaria Boots.  Pharyngitis Swab for strep at lab >>> Sent for culture  Continue amoxicillin at that time  Review literature listed below  Continue Arnuity Ellipta  Get scheduled for first available with Dr. Annamaria Boots   Seasonal allergic rhinitis Continue  Allegra  Could consider switch to Zyrtec if feeling Allegra is not working well for you Continue Nasonex  Continue nasal saline rinses     Lauraine Rinne, NP 01/09/2018

## 2018-01-09 NOTE — Patient Instructions (Addendum)
Swab for strep at lab >>> Sent for culture  Continue amoxicillin at that time  Review literature listed below  Continue [place] scheduled for first available with Dr. Annamaria Boots        November/2019 we will be moving! We will no longer be at our [place] location.  Be on the look out for a post card/mailer to let you know we have officially moved.  Our new address and phone number will be:  [address] [place], [place] [address] Telephone number: [phone]  It is flu season:   >>>Remember to be washing your hands regularly, using hand sanitizer, be careful to use around herself with has contact with people who are sick will increase her chances of getting sick yourself. >>> Best ways to protect herself from the flu: Receive the yearly flu vaccine, practice good hand hygiene washing with soap and also using hand sanitizer when available, eat a nutritious meals, get adequate rest, hydrate appropriately   Please contact the office if your symptoms worsen or you have concerns that you are not improving.   Thank you for choosing [facility] for your healthcare, and for allowing [name] to partner with you on your healthcare journey. I am thankful to be able to provide care to you today.   Wyn Quaker FNP-C        Pharyngitis Pharyngitis is a sore throat (pharynx). There is redness, pain, and swelling of your throat. Follow these instructions at home:  Drink enough fluids to keep your pee (urine) clear or pale yellow.  Only take medicine as told by your doctor. ? You may get sick again if you do not take medicine as told. Finish your medicines, even if you start to feel better. ? Do not take aspirin.  Rest.  Rinse your mouth (gargle) with salt water ( tsp of salt per 1 qt of water) every 1-2 hours. This will help the pain.  If you are not at risk for choking, you can suck on hard candy or sore throat lozenges. Contact a doctor if:  You have  large, tender lumps on your neck.  You have a rash.  You cough up green, yellow-brown, or bloody spit. Get help right away if:  You have a stiff neck.  You drool or cannot swallow liquids.  You throw up (vomit) or are not able to keep medicine or liquids down.  You have very bad pain that does not go away with medicine.  You have problems breathing (not from a stuffy nose). This information is not intended to replace advice given to you by your health care provider. Make sure you discuss any questions you have with your health care provider. Document Released: 08/31/2007 Document Revised: 08/20/2015 Document Reviewed: 11/19/2012 Elsevier Interactive Patient Education  2017 [employer].

## 2018-01-10 NOTE — Progress Notes (Signed)
See phone note dated 01/10/18

## 2018-01-10 NOTE — Progress Notes (Signed)
Strep swab is negative. Waiting on culture.   Wyn Quaker FNP

## 2018-01-11 LAB — CULTURE, GROUP A STREP
MICRO NUMBER: 91238425
SPECIMEN QUALITY:: ADEQUATE

## 2018-01-11 NOTE — Progress Notes (Signed)
Negative strep culture. Can stop antibiotics.   Kristy Garza

## 2018-01-12 ENCOUNTER — Encounter: Payer: Self-pay | Admitting: *Deleted

## 2018-01-25 DIAGNOSIS — K589 Irritable bowel syndrome without diarrhea: Secondary | ICD-10-CM | POA: Diagnosis not present

## 2018-01-25 DIAGNOSIS — R51 Headache: Secondary | ICD-10-CM | POA: Diagnosis not present

## 2018-01-25 DIAGNOSIS — D649 Anemia, unspecified: Secondary | ICD-10-CM | POA: Diagnosis not present

## 2018-01-25 DIAGNOSIS — R5381 Other malaise: Secondary | ICD-10-CM | POA: Diagnosis not present

## 2018-01-25 DIAGNOSIS — N951 Menopausal and female climacteric states: Secondary | ICD-10-CM | POA: Diagnosis not present

## 2018-01-25 DIAGNOSIS — K210 Gastro-esophageal reflux disease with esophagitis: Secondary | ICD-10-CM | POA: Diagnosis not present

## 2018-01-25 DIAGNOSIS — M810 Age-related osteoporosis without current pathological fracture: Secondary | ICD-10-CM | POA: Diagnosis not present

## 2018-01-27 ENCOUNTER — Other Ambulatory Visit: Payer: Medicare HMO

## 2018-02-15 ENCOUNTER — Encounter: Payer: Self-pay | Admitting: Family

## 2018-02-15 ENCOUNTER — Ambulatory Visit (INDEPENDENT_AMBULATORY_CARE_PROVIDER_SITE_OTHER): Payer: Medicare HMO | Admitting: Family

## 2018-02-15 VITALS — BP 104/72 | HR 72 | Temp 97.7°F | Ht 69.0 in | Wt 112.6 lb

## 2018-02-15 DIAGNOSIS — J309 Allergic rhinitis, unspecified: Secondary | ICD-10-CM | POA: Diagnosis not present

## 2018-02-15 DIAGNOSIS — J069 Acute upper respiratory infection, unspecified: Secondary | ICD-10-CM | POA: Diagnosis not present

## 2018-02-15 DIAGNOSIS — R05 Cough: Secondary | ICD-10-CM

## 2018-02-15 DIAGNOSIS — R059 Cough, unspecified: Secondary | ICD-10-CM

## 2018-02-15 MED ORDER — LEVOCETIRIZINE DIHYDROCHLORIDE 5 MG PO TABS: 5.0000 mg | ORAL_TABLET | Freq: Every evening | ORAL | 1 refills | Status: DC

## 2018-02-15 NOTE — Progress Notes (Signed)
Subjective:    Patient ID: Kristy Garza, female    DOB: 1950-12-02, 67 y.o.   MRN: [id number]  Chief Complaint  Patient presents with  . Cough    x 2 days  . Nasal Congestion  . Sore Throat   Pt presents to the office today with chronic sinus and allergies. She reports she starting coughing and having a sore throat about 3 days ago. She is followed by ENT and acupuncturists for her chronic allergies.   Cough  This is a chronic problem. The current episode started in the past 7 days. The problem has been gradually worsening. The problem occurs every few minutes. The cough is productive of sputum and productive of purulent sputum. Associated symptoms include ear pain, headaches, myalgias, nasal congestion, postnasal drip, rhinorrhea and a sore throat. Pertinent negatives include no ear congestion, fever, shortness of breath or wheezing. Associated symptoms comments: Headaches. The symptoms are aggravated by lying down. She has tried rest (Mucinex, Human resources officer, Mining engineer) for the symptoms. The treatment provided mild relief. Her past medical history is significant for environmental allergies.      Review of Systems  Constitutional: Negative for fever.  HENT: Positive for ear pain, postnasal drip, rhinorrhea and sore throat.   Respiratory: Positive for cough. Negative for shortness of breath and wheezing.   Musculoskeletal: Positive for myalgias.  Allergic/Immunologic: Positive for environmental allergies.  Neurological: Positive for headaches.  All other systems reviewed and are negative.      Objective:   Physical Exam  Constitutional: She is oriented to person, place, and time. She appears well-developed and well-nourished. No distress.  HENT:  Head: Normocephalic and atraumatic.  Right Ear: External ear normal.  Nose: Mucosal edema and rhinorrhea present. Right sinus exhibits maxillary sinus tenderness. Left sinus exhibits maxillary sinus tenderness.  Mouth/Throat: Posterior  oropharyngeal erythema present.  Eyes: Pupils are equal, round, and reactive to light.  Neck: Normal range of motion. Neck supple. No thyromegaly present.  Cardiovascular: Normal rate, regular rhythm, normal heart sounds and intact distal pulses.  No murmur heard. Pulmonary/Chest: Effort normal and breath sounds normal. No respiratory distress. She has no wheezes.  Constant nonproductive cough  Abdominal: Soft. Bowel sounds are normal. She exhibits no distension. There is no tenderness.  Musculoskeletal: Normal range of motion. She exhibits no edema or tenderness.  Neurological: She is alert and oriented to person, place, and time. She has normal reflexes. No cranial nerve deficit.  Skin: Skin is warm and dry.  Psychiatric: She has a normal mood and affect. Her behavior is normal. Judgment and thought content normal.  Vitals reviewed.   BP 104/72   Pulse 72   Temp 97.7 F (36.5 C) (Oral)   Ht 5\' 9"  (1.753 m)   Wt 112 lb 9.6 oz (51.1 kg)   SpO2 100%   BMI 16.63 kg/m      Assessment & Plan:  Kristy Garza comes in today with chief complaint of Cough (x 2 days); Nasal Congestion; and Sore Throat   Diagnosis and orders addressed:  1. Chronic allergic rhinitis -We will try Xyzal with patient to see if works better than her Allegra  Avoid allergens  Keep all appts with Specialists  - levocetirizine (XYZAL) 5 MG tablet; Take 1 tablet (5 mg total) by mouth every evening.  Dispense: 90 tablet; Refill: 1  2. Cough - levocetirizine (XYZAL) 5 MG tablet; Take 1 tablet (5 mg total) by mouth every evening.  Dispense: 90 tablet; Refill: 1  3. Viral upper respiratory tract infection - Take meds as prescribed - Use a cool mist humidifier  -Use saline nose sprays frequently -Force fluids -For fever or aces or pains- take tylenol or ibuprofen. -Throat lozenges if help -RTO if symptoms worsen or do not improve - levocetirizine (XYZAL) 5 MG tablet; Take 1 tablet (5 mg total) by mouth  every evening.  Dispense: 90 tablet; Refill: [place], FNP

## 2018-02-15 NOTE — Patient Instructions (Signed)
Upper Respiratory Infection, Adult Most upper respiratory infections (URIs) are caused by a virus. A URI affects the nose, throat, and upper air passages. The most common type of URI is often called "the common cold." Follow these instructions at home:  Take medicines only as told by your doctor.  Gargle warm saltwater or take cough drops to comfort your throat as told by your doctor.  Use a warm mist humidifier or inhale steam from a shower to increase air moisture. This may make it easier to breathe.  Drink enough fluid to keep your pee (urine) clear or pale yellow.  Eat soups and other clear broths.  Have a healthy diet.  Rest as needed.  Go back to work when your fever is gone or your doctor says it is okay. ? You may need to stay home longer to avoid giving your URI to others. ? You can also wear a face mask and wash your hands often to prevent spread of the virus.  Use your inhaler more if you have asthma.  Do not use any tobacco products, including cigarettes, chewing tobacco, or electronic cigarettes. If you need help quitting, ask your doctor. Contact a doctor if:  You are getting worse, not better.  Your symptoms are not helped by medicine.  You have chills.  You are getting more short of breath.  You have brown or red mucus.  You have yellow or brown discharge from your nose.  You have pain in your face, especially when you bend forward.  You have a fever.  You have puffy (swollen) neck glands.  You have pain while swallowing.  You have white areas in the back of your throat. Get help right away if:  You have very bad or constant: ? Headache. ? Ear pain. ? Pain in your forehead, behind your eyes, and over your cheekbones (sinus pain). ? Chest pain.  You have long-lasting (chronic) lung disease and any of the following: ? Wheezing. ? Long-lasting cough. ? Coughing up blood. ? A change in your usual mucus.  You have a stiff neck.  You have  changes in your: ? Vision. ? Hearing. ? Thinking. ? Mood. This information is not intended to replace advice given to you by your health care provider. Make sure you discuss any questions you have with your health care provider. Document Released: 08/31/2007 Document Revised: 11/15/2015 Document Reviewed: 06/19/2013 Elsevier Interactive Patient Education  2018 [employer] Inc.  

## 2018-02-18 ENCOUNTER — Telehealth: Payer: Self-pay | Admitting: Allergy

## 2018-02-18 NOTE — Telephone Encounter (Signed)
Patient called stating that she went to her PCP on 11/21 and was diagnosed with viral URI. Patient is concerned as now having some colored discharge. Symptoms started this Tuesday 11/19.  Advised patient that discolored discharge does not mean bacterial infection. If not feeling better by next Monday/Tuesday call back and may need a course of antibiotics at that time.

## 2018-02-19 ENCOUNTER — Telehealth: Payer: Self-pay | Admitting: Family Medicine

## 2018-02-19 NOTE — Telephone Encounter (Signed)
What symptoms do you have? vomiting and diarrhea  How long have you been sick? This morning  Have you been seen for this problem? no  If your provider decides to give you a prescription, which pharmacy would you like for it to be sent to? [facility]   Patient informed that this information will be sent to the clinical staff for review and that they should receive a follow up call.

## 2018-02-19 NOTE — Telephone Encounter (Signed)
Spoke with pt and she states she has been vomiting since 10 am this morning and she isn't eating and trying to drink ginger ale which she is having trouble keeping down. She has some Zofran left over and wanted to know if she should take those. Advised pt to take the Zofran and if she continues to vomit without relief she needs to call back in the morning for appt to be evaluated. Pt voiced understanding.

## 2018-02-24 ENCOUNTER — Telehealth: Payer: Self-pay | Admitting: Family Medicine

## 2018-02-24 NOTE — Telephone Encounter (Signed)
Please address

## 2018-02-26 ENCOUNTER — Other Ambulatory Visit: Payer: Self-pay | Admitting: Family Medicine

## 2018-02-26 MED ORDER — FLUCONAZOLE 150 MG PO TABS: 150.0000 mg | ORAL_TABLET | Freq: Once | ORAL | 0 refills | Status: AC

## 2018-02-26 NOTE — Telephone Encounter (Signed)
Diflucan sent in.  She is to continue following up with her naturopathic doctor for ongoing needs pertaining to those tests.

## 2018-02-26 NOTE — Telephone Encounter (Signed)
Pt aware.

## 2018-02-26 NOTE — Telephone Encounter (Signed)
Dr. Alinda Deem- this patient saw you in October and  tested positive for candidia in blood- she went and saw someone for this and they told her to eliminate wheat and sugar from diet. She thinks this has made it worse. She wants to know if diflucan will help. I was not sure what you would want to do.

## 2018-03-09 DIAGNOSIS — R5381 Other malaise: Secondary | ICD-10-CM | POA: Diagnosis not present

## 2018-03-09 DIAGNOSIS — M810 Age-related osteoporosis without current pathological fracture: Secondary | ICD-10-CM | POA: Diagnosis not present

## 2018-03-09 DIAGNOSIS — N959 Unspecified menopausal and perimenopausal disorder: Secondary | ICD-10-CM | POA: Diagnosis not present

## 2018-03-09 DIAGNOSIS — D649 Anemia, unspecified: Secondary | ICD-10-CM | POA: Diagnosis not present

## 2018-03-09 DIAGNOSIS — K589 Irritable bowel syndrome without diarrhea: Secondary | ICD-10-CM | POA: Diagnosis not present

## 2018-03-19 ENCOUNTER — Telehealth: Payer: Self-pay | Admitting: Family

## 2018-03-19 DIAGNOSIS — M542 Cervicalgia: Secondary | ICD-10-CM

## 2018-03-19 NOTE — Telephone Encounter (Signed)
Left detailed message stating requested referral placed and to call back with any further questions or concerns.

## 2018-03-19 NOTE — Telephone Encounter (Signed)
Ok to do referral?  

## 2018-03-19 NOTE — Telephone Encounter (Signed)
Referral ordered

## 2018-03-22 ENCOUNTER — Ambulatory Visit (INDEPENDENT_AMBULATORY_CARE_PROVIDER_SITE_OTHER): Payer: Medicare HMO | Admitting: Family Medicine

## 2018-03-22 ENCOUNTER — Encounter: Payer: Self-pay | Admitting: Family Medicine

## 2018-03-22 VITALS — BP 105/59 | HR 84 | Temp 97.5°F | Ht 69.0 in | Wt 106.0 lb

## 2018-03-22 DIAGNOSIS — R300 Dysuria: Secondary | ICD-10-CM | POA: Diagnosis not present

## 2018-03-22 DIAGNOSIS — R350 Frequency of micturition: Secondary | ICD-10-CM | POA: Diagnosis not present

## 2018-03-22 LAB — URINALYSIS, COMPLETE
BILIRUBIN UA: NEGATIVE
Glucose, UA: NEGATIVE
Ketones, UA: NEGATIVE
LEUKOCYTES UA: NEGATIVE
Nitrite, UA: NEGATIVE
PH UA: 7 (ref 5.0–7.5)
PROTEIN UA: NEGATIVE
RBC UA: NEGATIVE
Specific Gravity, UA: 1.01 (ref 1.005–1.030)
Urobilinogen, Ur: 0.2 mg/dL (ref 0.2–1.0)

## 2018-03-22 LAB — MICROSCOPIC EXAMINATION
RBC, UA: NONE SEEN /hpf (ref 0–2)
RENAL EPITHEL UA: NONE SEEN /HPF

## 2018-03-22 NOTE — Progress Notes (Signed)
[name], Kristy Distance, DO Chief Complaint  Patient presents with  . Urinary Tract Infection    dysuria, hesitancy; went to pharmacy and bought otc testing strips which showed positive, started antibiotic she had at home    Current Issues:  Presents with 7 days of dysuria, urinary urgency and urinary frequency Associated symptoms include:  lower abdominal pain. She denies fever, chills, flank pain, hematuria, weakness, or confusion. States she dipped her urine with a test she got at the pharmacy and it was positive so she took her dose of Monurol. States she continues to have frequency but denies dysuria.   There is a previous history of of similar symptoms. Sexually active:  No   No concern for STI.  Prior to Admission medications   Medication Sig Start Date End Date Taking? Authorizing Provider  acetaminophen (TYLENOL) 500 MG tablet Take by mouth.   Yes [provider]  albuterol (PROVENTIL HFA;VENTOLIN HFA) 108 (90 Base) MCG/ACT inhaler INHALE TWO PUFFS EVERY FOUR TO SIX HOURS AS NEEDED FOR COUGH OR WHEEZE. 12/07/17  Yes Kozlow, Donnamarie Poag, MD  amitriptyline (ELAVIL) 10 MG tablet TAKE 1 TABLET BY MOUTH EVERY DAY AT NIGHT 12/07/17  Yes [provider]  ARNUITY ELLIPTA 200 MCG/ACT AEPB TAKE 1 PUFF BY MOUTH EVERY DAY 08/10/17  Yes Kozlow, Donnamarie Poag, MD  azelastine (ASTELIN) 0.1 % nasal spray Place 2 sprays into both nostrils 2 (two) times daily. Use in each nostril as directed 01/05/18  Yes Valentina Shaggy, MD  b complex vitamins capsule Take 1 capsule by mouth daily.   Yes [provider]  benzonatate (TESSALON) 200 MG capsule TAKE 1 CAPSULE (200 MG TOTAL) BY MOUTH 2 (TWO) TIMES DAILY AS NEEDED FOR COUGH. 01/08/18  Yes Gottschalk, Ashly M, DO  bisacodyl (DULCOLAX) 5 MG EC tablet Take 5 mg by mouth daily as needed for moderate constipation.   Yes [provider]  calcium citrate-vitamin D (CITRACAL+D) 315-200 MG-UNIT tablet Take 2 tablets by mouth 2  (two) times daily.   Yes [provider]  dextromethorphan-guaiFENesin (MUCINEX DM) 30-600 MG 12hr tablet Take 1 tablet by mouth 2 (two) times daily.   Yes [provider]  Ergocalciferol (VITAMIN D2) 2000 units TABS Take 2,000 Units by mouth daily.   Yes [provider]  levocetirizine (XYZAL) 5 MG tablet Take 1 tablet (5 mg total) by mouth every evening. 02/15/18  Yes Hawks, Christy A, FNP  lidocaine (XYLOCAINE) 2 % jelly Apply topically as needed. 06/12/17  Yes Timmothy Euler, MD  lidocaine (XYLOCAINE) 5 % ointment Apply daily as needed 12/07/17  Yes [provider]  magnesium oxide (MAG-OX) 400 MG tablet Take by mouth.   Yes [provider]  meloxicam (MOBIC) 7.5 MG tablet Take 1 tablet (7.5 mg total) by mouth daily. (prn pain) 12/29/17  Yes Ronnie Doss M, DO  Methylcellulose, Laxative, (CITRUCEL PO) Take by mouth.   Yes [provider]  mometasone (ELOCON) 0.1 % ointment 1 application to skin lesions twice daily until resolved. 12/07/17  Yes Kozlow, Donnamarie Poag, MD  mometasone (NASONEX) 50 MCG/ACT nasal spray Place 2 sprays into the nose daily. 03/14/17  Yes Kozlow, Donnamarie Poag, MD  ondansetron (ZOFRAN) 4 MG tablet Take 1 tablet (4 mg total) by mouth every 8 (eight) hours as needed for nausea or vomiting. 01/04/18  Yes Prakriti Carignan, Connye Burkitt, FNP  pantoprazole (PROTONIX) 40 MG tablet Take 1 tablet (40 mg total) by mouth daily. 03/14/17  Yes Allena Katz  J, MD  phenylephrine (SUDAFED PE) 10 MG TABS tablet Take 10 mg by mouth every 4 (four) hours as needed.   Yes [provider]  Polyethylene Glycol 3350 (MIRALAX PO) Take by mouth as needed.   Yes [provider]  Probiotic Product (PROBIOTIC PO) Take by mouth daily.   Yes [provider]  Simethicone (GAS-X PO) Take by mouth.   Yes [provider]  tizanidine (ZANAFLEX) 2 MG capsule Take 1-2 capsules (2-4 mg total) by mouth 3 (three) times daily as needed for muscle  spasms. 12/29/17  Yes Gottschalk, Leatrice Jewels M, DO  traMADol (ULTRAM) 50 MG tablet Take 1 tablet (50 mg total) by mouth every 6 (six) hours as needed. 08/18/17  Yes Timmothy Euler, MD  UNABLE TO FIND Take 1 capsule by mouth daily. Dv3- vitamin D 3 plus immune support   Yes [provider]    Review of Systems Review of Systems  Constitutional: Negative for chills, fever and malaise/fatigue.  Gastrointestinal: Positive for abdominal pain. Negative for constipation, nausea and vomiting.  Genitourinary: Positive for dysuria, frequency and urgency. Negative for flank pain and hematuria.  Neurological: Negative for weakness.  All other systems reviewed and are negative.    PE:  BP (!) 105/59   Pulse 84   Temp (!) 97.5 F (36.4 C) (Oral)   Ht 5\' 9"  (1.753 m)   Wt 106 lb (48.1 kg)   BMI 15.65 kg/m  Physical Exam  Constitutional: She is oriented to person, place, and time. She appears well-developed and well-nourished. She is cooperative. She appears distressed.  HENT:  Head: Normocephalic and atraumatic.  Cardiovascular: Normal rate and regular rhythm. Exam reveals no gallop and no friction rub.  No murmur heard. Pulmonary/Chest: Effort normal and breath sounds normal. No respiratory distress.  Abdominal: Soft. Normal appearance and bowel sounds are normal. There is no hepatosplenomegaly. There is abdominal tenderness (mild) in the suprapubic area. There is no rigidity, no rebound, no guarding, no CVA tenderness, no tenderness at McBurney's point and negative Murphy's sign.  Neurological: She is alert and oriented to person, place, and time.  Skin: Skin is warm and dry.  Psychiatric: She has a normal mood and affect. Her behavior is normal. Judgment and thought content normal.     Results for orders placed or performed in visit on 01/09/18  Culture, Group A Strep  Result Value Ref Range   MICRO NUMBER: [id number]    SPECIMEN QUALITY: ADEQUATE    SOURCE: THROAT    STATUS: FINAL     RESULT: No group A Streptococcus isolated   Beta Strep Screen  Result Value Ref Range   Streptococcus, Group A Screen (Direct) Negative Negative   Urinalysis in office: negative for blood, nitrites, or leukocytes, few bacteria.  Assessment and Plan:    Kristy Garza was seen today for urinary tract infection.  Diagnoses and all orders for this visit:  Dysuria -     Urinalysis, Complete -     Urine Culture  Urinary frequency Increase water intake. Avoid bladder irritants. Report any new or worsening symptoms. Culture pending.  -     Urinalysis, Complete -     Urine Culture   The above assessment and management plan was discussed with the patient. The patient verbalized understanding of and has agreed to the management plan. Patient is aware to call the clinic if symptoms fail to improve or worsen. Patient is aware when to return to the clinic for a follow-up visit. Patient  educated on when it is appropriate to go to the emergency department.   Monia Pouch, FNP-C [facility] [address] [place], [place] [address] [phone]

## 2018-03-26 ENCOUNTER — Telehealth: Payer: Self-pay | Admitting: Family Medicine

## 2018-03-26 ENCOUNTER — Other Ambulatory Visit: Payer: Self-pay | Admitting: Family Medicine

## 2018-03-26 DIAGNOSIS — N390 Urinary tract infection, site not specified: Secondary | ICD-10-CM

## 2018-03-26 DIAGNOSIS — B9620 Unspecified Escherichia coli [E. coli] as the cause of diseases classified elsewhere: Secondary | ICD-10-CM

## 2018-03-26 LAB — URINE CULTURE

## 2018-03-26 MED ORDER — AMOXICILLIN-POT CLAVULANATE 875-125 MG PO TABS: 1.0000 | ORAL_TABLET | Freq: Two times a day (BID) | ORAL | 0 refills | Status: AC

## 2018-03-26 NOTE — Telephone Encounter (Signed)
Phone call to patient- lab results given.

## 2018-03-30 ENCOUNTER — Telehealth: Payer: Self-pay | Admitting: Family Medicine

## 2018-03-30 DIAGNOSIS — R159 Full incontinence of feces: Secondary | ICD-10-CM | POA: Diagnosis not present

## 2018-03-30 DIAGNOSIS — K5904 Chronic idiopathic constipation: Secondary | ICD-10-CM | POA: Diagnosis not present

## 2018-03-30 DIAGNOSIS — K219 Gastro-esophageal reflux disease without esophagitis: Secondary | ICD-10-CM | POA: Diagnosis not present

## 2018-03-30 NOTE — Telephone Encounter (Signed)
Pt states she has been on Augment for 2 days now and can't tell much of a difference. Advised pt that the Culture and Sensitivity showed the Augmentin would kill the bacteria causing the infection and to complete all the antibiotic. Pt voiced understanding.

## 2018-04-03 ENCOUNTER — Ambulatory Visit: Payer: Self-pay | Admitting: Internal Medicine

## 2018-04-05 ENCOUNTER — Telehealth: Payer: Self-pay | Admitting: Family Medicine

## 2018-04-05 NOTE — Telephone Encounter (Signed)
Faxed labs to Dr Alona Bene

## 2018-04-10 DIAGNOSIS — D509 Iron deficiency anemia, unspecified: Secondary | ICD-10-CM | POA: Diagnosis not present

## 2018-04-10 DIAGNOSIS — D539 Nutritional anemia, unspecified: Secondary | ICD-10-CM | POA: Diagnosis not present

## 2018-04-10 DIAGNOSIS — J449 Chronic obstructive pulmonary disease, unspecified: Secondary | ICD-10-CM | POA: Diagnosis not present

## 2018-04-10 DIAGNOSIS — M810 Age-related osteoporosis without current pathological fracture: Secondary | ICD-10-CM | POA: Diagnosis not present

## 2018-04-10 DIAGNOSIS — X58XXXS Exposure to other specified factors, sequela: Secondary | ICD-10-CM | POA: Diagnosis not present

## 2018-04-10 DIAGNOSIS — S0300XS Dislocation of jaw, unspecified side, sequela: Secondary | ICD-10-CM | POA: Diagnosis not present

## 2018-04-11 ENCOUNTER — Ambulatory Visit (INDEPENDENT_AMBULATORY_CARE_PROVIDER_SITE_OTHER): Payer: Medicare HMO | Admitting: Family Medicine

## 2018-04-11 ENCOUNTER — Other Ambulatory Visit: Payer: Self-pay | Admitting: Allergy & Immunology

## 2018-04-11 ENCOUNTER — Encounter: Payer: Self-pay | Admitting: Family Medicine

## 2018-04-11 VITALS — BP 131/72 | HR 82 | Temp 97.4°F | Ht 69.0 in | Wt 108.0 lb

## 2018-04-11 DIAGNOSIS — R300 Dysuria: Secondary | ICD-10-CM

## 2018-04-11 DIAGNOSIS — F411 Generalized anxiety disorder: Secondary | ICD-10-CM

## 2018-04-11 DIAGNOSIS — F4322 Adjustment disorder with anxiety: Secondary | ICD-10-CM

## 2018-04-11 DIAGNOSIS — R6884 Jaw pain: Secondary | ICD-10-CM | POA: Diagnosis not present

## 2018-04-11 DIAGNOSIS — G8929 Other chronic pain: Secondary | ICD-10-CM

## 2018-04-11 DIAGNOSIS — R69 Illness, unspecified: Secondary | ICD-10-CM | POA: Diagnosis not present

## 2018-04-11 LAB — URINALYSIS, COMPLETE
Bilirubin, UA: NEGATIVE
GLUCOSE, UA: NEGATIVE
Ketones, UA: NEGATIVE
Leukocytes, UA: NEGATIVE
NITRITE UA: NEGATIVE
Protein, UA: NEGATIVE
RBC, UA: NEGATIVE
Specific Gravity, UA: 1.01 (ref 1.005–1.030)
Urobilinogen, Ur: 0.2 mg/dL (ref 0.2–1.0)
pH, UA: 6 (ref 5.0–7.5)

## 2018-04-11 LAB — MICROSCOPIC EXAMINATION
Bacteria, UA: NONE SEEN
Epithelial Cells (non renal): NONE SEEN /hpf (ref 0–10)
RBC, UA: NONE SEEN /hpf (ref 0–2)
Renal Epithel, UA: NONE SEEN /hpf

## 2018-04-11 MED ORDER — ESCITALOPRAM OXALATE 10 MG PO TABS: 10.0000 mg | ORAL_TABLET | Freq: Every day | ORAL | 5 refills | Status: DC

## 2018-04-11 MED ORDER — TRAMADOL HCL 50 MG PO TABS: 50.0000 mg | ORAL_TABLET | Freq: Two times a day (BID) | ORAL | 1 refills | Status: DC | PRN

## 2018-04-11 MED ORDER — LORAZEPAM 0.5 MG PO TABS: 0.5000 mg | ORAL_TABLET | Freq: Two times a day (BID) | ORAL | 1 refills | Status: DC | PRN

## 2018-04-11 NOTE — Patient Instructions (Addendum)
See me in 6-8 weeks for recheck.  Taking the medicine as directed and not missing any doses is one of the best things you can do to treat your anxiety/ depression.  Here are some things to keep in mind:  1) Side effects (stomach upset, some increased anxiety) may happen before you notice a benefit.  These side effects typically go away over time. 2) Changes to your dose of medicine or a change in medication all together is sometimes necessary 3) Most people need to be on medication at least 12 months 4) Many people will notice an improvement within two weeks but the full effect of the medication can take up to 4-6 weeks 5) Stopping the medication when you start feeling better often results in a return of symptoms 6) Never discontinue your medication without contacting a health care professional first.  Some medications require gradual discontinuation/ taper and can make you sick if you stop them abruptly.  If your symptoms worsen or you have thoughts of suicide/homicide, PLEASE SEEK IMMEDIATE MEDICAL ATTENTION.  You may always call:  National Suicide Hotline: 458-675-5556 [name]: 9034742912 Crisis Recovery in [place]: [phone]   These are available 24 hours a day, 7 days a week.

## 2018-04-11 NOTE — Progress Notes (Signed)
Subjective: CC: dysuria PCP: Kristy Norlander, DO Kristy Garza is a 68 y.o. female presenting to clinic today for:  1. Dysuria Patient reports onset of dysuria last week.  She took a Monurol last Thursday.  She notes that she continues to have dysuria.  Denies any hematuria, fevers.  She is being seen by [facility] urology and they would like [name] to fax over any urine results to [phone].  2.  Anxiety symptoms Patient goes on to state that she has had quite a bit anxiety surrounding her health and to her family's health lately.  She is interested in going back on Lexapro and is also asking for something for as needed use while she transitions back onto Lexapro.  Of note, she does also occasionally take tramadol but this is very rare.  She has amitriptyline at nighttime as well.  3.  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  She has plans to see the oral surgeon again soon for possible injections into the TMJ.  She takes tramadol very sparingly for this and is asking for refill to be put on file.  Denies excessive sedation, falls or confusion on tramadol medicine.   ROS: Per HPI  Allergies  Allergen Reactions  . Cefuroxime Axetil Other (See Comments)    Extreme gas  . Gabapentin Other (See Comments)    Constipation Constipation  . Pregabalin Other (See Comments)    Drowsiness, dry mouth Drowsiness, dry mouth Makes her tired and thirsty  . Augmentin [Amoxicillin-Pot Clavulanate] Diarrhea  . Avelox [Moxifloxacin Hcl In Nacl] Other (See Comments)    Tremors   . Biaxin [Clarithromycin]     Unsure of reaction  . Cedax [Ceftibuten] Other (See Comments)    tremors  . Ciprofloxacin Other (See Comments) and Nausea And Vomiting    Blurred vision Blurred vision Pt can't remember reaction  . Clindamycin/Lincomycin     tachycardia  . Levofloxacin Other (See Comments)    Feels dehydrated  . Lincomycin Other (See Comments)    tachycardia  .  Montelukast Sodium Other (See Comments)    Numbness and tingling in hand. Numbness and tingling in hand.  . Sulfonamide Derivatives Other (See Comments)    Gi upset   Past Medical History:  Diagnosis Date  . Acid reflux   . Allergy   . Arthritis    ARTHRITIS IN NECK BY DR. Alroy Dust [facility]  . Asthma   . Interstitial cystitis   . Osteoporosis   . PONV (postoperative nausea and vomiting)   . Tinnitus   . Trigeminal neuralgia    Atypical trigeminal neuralgia    Current Outpatient Medications:  .  acetaminophen (TYLENOL) 500 MG tablet, Take by mouth., Disp: , Rfl:  .  albuterol (PROVENTIL HFA;VENTOLIN HFA) 108 (90 Base) MCG/ACT inhaler, INHALE TWO PUFFS EVERY FOUR TO SIX HOURS AS NEEDED FOR COUGH OR WHEEZE., Disp: 18 g, Rfl: 1 .  amitriptyline (ELAVIL) 10 MG tablet, TAKE 1 TABLET BY MOUTH EVERY DAY AT NIGHT, Disp: , Rfl: 11 .  ARNUITY ELLIPTA 200 MCG/ACT AEPB, TAKE 1 PUFF BY MOUTH EVERY DAY, Disp: 30 each, Rfl: 2 .  azelastine (ASTELIN) 0.1 % nasal spray, PLACE 2 SPRAYS INTO BOTH NOSTRILS 2 (TWO) TIMES DAILY. USE IN EACH NOSTRIL AS DIRECTED, Disp: 30 mL, Rfl: 2 .  b complex vitamins capsule, Take 1 capsule by mouth daily., Disp: , Rfl:  .  benzonatate (TESSALON) 200 MG capsule, TAKE 1 CAPSULE (200 MG  TOTAL) BY MOUTH 2 (TWO) TIMES DAILY AS NEEDED FOR COUGH., Disp: 40 capsule, Rfl: 0 .  bisacodyl (DULCOLAX) 5 MG EC tablet, Take 5 mg by mouth daily as needed for moderate constipation., Disp: , Rfl:  .  calcium citrate-vitamin D (CITRACAL+D) 315-200 MG-UNIT tablet, Take 2 tablets by mouth 2 (two) times daily., Disp: , Rfl:  .  dextromethorphan-guaiFENesin (MUCINEX DM) 30-600 MG 12hr tablet, Take 1 tablet by mouth 2 (two) times daily., Disp: , Rfl:  .  Ergocalciferol (VITAMIN D2) 2000 units TABS, Take 2,000 Units by mouth daily., Disp: , Rfl:  .  lidocaine (XYLOCAINE) 2 % jelly, Apply topically as needed., Disp: 30 mL, Rfl: 10 .  lidocaine (XYLOCAINE) 5 % ointment, Apply daily as needed, Disp:  , Rfl:  .  Methylcellulose, Laxative, (CITRUCEL PO), Take by mouth., Disp: , Rfl:  .  mometasone (ELOCON) 0.1 % ointment, 1 application to skin lesions twice daily until resolved., Disp: 45 g, Rfl: 0 .  mometasone (NASONEX) 50 MCG/ACT nasal spray, Place 2 sprays into the nose daily., Disp: 17 g, Rfl: 5 .  ondansetron (ZOFRAN) 4 MG tablet, Take 1 tablet (4 mg total) by mouth every 8 (eight) hours as needed for nausea or vomiting., Disp: 20 tablet, Rfl: 0 .  pantoprazole (PROTONIX) 40 MG tablet, Take 1 tablet (40 mg total) by mouth daily., Disp: 30 tablet, Rfl: 5 .  phenylephrine (SUDAFED PE) 10 MG TABS tablet, Take 10 mg by mouth every 4 (four) hours as needed., Disp: , Rfl:  .  Polyethylene Glycol 3350 (MIRALAX PO), Take by mouth as needed., Disp: , Rfl:  .  Probiotic Product (PROBIOTIC PO), Take by mouth daily., Disp: , Rfl:  .  Simethicone (GAS-X PO), Take by mouth., Disp: , Rfl:  .  tizanidine (ZANAFLEX) 2 MG capsule, Take 1-2 capsules (2-4 mg total) by mouth 3 (three) times daily as needed for muscle spasms., Disp: 60 capsule, Rfl: 2 .  traMADol (ULTRAM) 50 MG tablet, Take 1 tablet (50 mg total) by mouth every 6 (six) hours as needed., Disp: 30 tablet, Rfl: 1 .  UNABLE TO FIND, Take 1 capsule by mouth daily. Dv3- vitamin D 3 plus immune support, Disp: , Rfl:  Social History   Socioeconomic History  . Marital status: Married    Spouse name: Not on file  . Number of children: 3  . Years of education: 29  . Highest education level: Some college, no degree  Occupational History  . Occupation: CNA    Comment: Retired  Scientific laboratory technician  . Financial resource strain: Not on file  . Food insecurity:    Worry: Never true    Inability: Never true  . Transportation needs:    Medical: No    Non-medical: No  Tobacco Use  . Smoking status: Never Smoker  . Smokeless tobacco: Never Used  Substance and Sexual Activity  . Alcohol use: No    Alcohol/week: 0.0 standard drinks    Comment: 01-19-2016  per pt no  . Drug use: No    Comment: 01-19-2016 per pt no   . Sexual activity: Never  Lifestyle  . Physical activity:    Days per week: 0 days    Minutes per session: 0 min  . Stress: Not at all  Relationships  . Social connections:    Talks on phone: More than three times a week    Gets together: More than three times a week    Attends religious service: Never  Active member of club or organization: No    Attends meetings of clubs or organizations: Never    Relationship status: Married  . Intimate partner violence:    Fear of current or ex partner: No    Emotionally abused: No    Physically abused: No    Forced sexual activity: No  Other Topics Concern  . Not on file  Social History Narrative   Lives with husband.    Family History  Problem Relation Age of Onset  . Hyperlipidemia Mother   . Arthritis Mother   . Cancer Mother   . Lymphoma Mother   . Cancer Father   . Allergies Father   . Allergies Sister   . Allergies Sister   . Allergic rhinitis Neg Hx   . Angioedema Neg Hx   . Asthma Neg Hx   . Eczema Neg Hx   . Immunodeficiency Neg Hx   . Urticaria Neg Hx     Objective: Office vital signs reviewed. BP 131/72   Pulse 82   Temp (!) 97.4 F (36.3 C) (Oral)   Ht 5\' 9"  (1.753 m)   Wt 108 lb (49 kg)   BMI 15.95 kg/m   Physical Examination:  General: Awake, alert, well nourished, No acute distress Psych: Somewhat anxious.  Mood stable.  Affect appropriate.  Pleasant and interactive.  Does not appear to be responding to internal stimuli. Depression screen [facility] 2/9 04/11/2018 02/15/2018 01/04/2018  Decreased Interest 0 0 0  Down, Depressed, Hopeless 0 0 0  PHQ - 2 Score 0 0 0  Altered sleeping 0 - -  Tired, decreased energy 0 - -  Change in appetite 0 - -  Feeling bad or failure about yourself  0 - -  Trouble concentrating 0 - -  Moving slowly or fidgety/restless 0 - -  Suicidal thoughts 0 - -  PHQ-9 Score 0 - -  Some recent data might be hidden    GAD 7 : Generalized Anxiety Score 04/11/2018  Nervous, Anxious, on Edge 1  Control/stop worrying 0  Worry too much - different things 0  Trouble relaxing 0  Restless 0  Easily annoyed or irritable 0  Afraid - awful might happen 0  Total GAD 7 Score 1    Assessment/ Plan: 68 y.o. female   1. Dysuria Likely related to her interstitial cystitis.  Her urinalysis was totally unremarkable today.  No evidence of infection.  Continue to follow-up with urology as directed.  I will send a copy of her urinalysis to the number provided in office - Urinalysis, Complete  2. Adjustment disorder with anxious mood Previously treated with Lexapro.  I am not entirely sure that her screening questionnaires actually referral active of her current state.  I think she is somewhat stoic.  She wishes to go back on this medicine.  This is been initiated 10 mg daily.  She will follow-up in the next 6 to 8 weeks for recheck.  3. Anxiety state I have given her a small quantity of Ativan to have on hand for breakthrough panic and anxiety.  She voices good understanding of use of the medication and understands its addictive and risky properties.  Will not plan to continue this long-term, particularly given concomitant use of tramadol.  4. Chronic jaw pain Prescription has been sent.  The national narcotic database was reviewed and there were no red flags.  She will follow-up PRN this issue. - traMADol (ULTRAM) 50 MG tablet; Take 1 tablet (50  mg total) by mouth every 12 (twelve) hours as needed.  Dispense: 30 tablet; Refill: 1   Orders Placed This Encounter  Procedures  . Urinalysis, Complete   Meds ordered this encounter  Medications  . escitalopram (LEXAPRO) 10 MG tablet    Sig: Take 1 tablet (10 mg total) by mouth daily.    Dispense:  30 tablet    Refill:  5  . LORazepam (ATIVAN) 0.5 MG tablet    Sig: Take 1 tablet (0.5 mg total) by mouth 2 (two) times daily as needed for anxiety.    Dispense:  30  tablet    Refill:  1  . traMADol (ULTRAM) 50 MG tablet    Sig: Take 1 tablet (50 mg total) by mouth every 12 (twelve) hours as needed.    Dispense:  30 tablet    Refill:  1    Not to exceed 5 additional fills before 02/07/2016.   Kristy Norlander, DO [place] [phone]

## 2018-04-12 DIAGNOSIS — R159 Full incontinence of feces: Secondary | ICD-10-CM | POA: Diagnosis not present

## 2018-04-12 DIAGNOSIS — K5904 Chronic idiopathic constipation: Secondary | ICD-10-CM | POA: Diagnosis not present

## 2018-04-13 ENCOUNTER — Encounter: Payer: Self-pay | Admitting: Family Medicine

## 2018-04-13 ENCOUNTER — Ambulatory Visit (INDEPENDENT_AMBULATORY_CARE_PROVIDER_SITE_OTHER): Payer: Medicare HMO | Admitting: Family Medicine

## 2018-04-13 VITALS — BP 105/69 | HR 78 | Temp 97.2°F | Ht 69.0 in | Wt 110.0 lb

## 2018-04-13 DIAGNOSIS — R05 Cough: Secondary | ICD-10-CM

## 2018-04-13 DIAGNOSIS — F4322 Adjustment disorder with anxiety: Secondary | ICD-10-CM

## 2018-04-13 DIAGNOSIS — R69 Illness, unspecified: Secondary | ICD-10-CM | POA: Diagnosis not present

## 2018-04-13 DIAGNOSIS — M26623 Arthralgia of bilateral temporomandibular joint: Secondary | ICD-10-CM | POA: Diagnosis not present

## 2018-04-13 DIAGNOSIS — Z681 Body mass index (BMI) 19 or less, adult: Secondary | ICD-10-CM | POA: Diagnosis not present

## 2018-04-13 DIAGNOSIS — R053 Chronic cough: Secondary | ICD-10-CM

## 2018-04-13 DIAGNOSIS — G4763 Sleep related bruxism: Secondary | ICD-10-CM | POA: Diagnosis not present

## 2018-04-13 MED ORDER — BENZONATATE 200 MG PO CAPS: 200.0000 mg | ORAL_CAPSULE | Freq: Two times a day (BID) | ORAL | 0 refills | Status: DC | PRN

## 2018-04-13 MED ORDER — DULOXETINE HCL 20 MG PO CPEP: 20.0000 mg | ORAL_CAPSULE | Freq: Every day | ORAL | 1 refills | Status: DC

## 2018-04-13 NOTE — Progress Notes (Signed)
Subjective: CC: headache PCP: [name], DO CWC:[facility] Kristy Garza is a 69 Kristy.o. female presenting to clinic today for:  1. 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  She has not yet scheduled an appointment with her oral surgeon.  She has multiple questions with regards to ongoing use and addictive properties of tramadol.  She wonders if this is a good medicine for her to continue.  Of note, she also has issues with a recent hemoglobin drop and is under the care of gastroenterology for further evaluation of this.  There is plans for colonoscopy to look for polyps soon.  She is wondering if she would benefit from an oral NSAID.  She also states that she needs a referral to the oral surgeon and brings me a card with his name on it today.  She would also like a referral to the physical therapist at [facility], as she states that they do work with TMJ and have benefited her in the past.  She has not yet started the Lexapro and wonders if perhaps instead she can start Cymbalta since this seems to help with the pain relief.   ROS: Per HPI  Allergies  Allergen Reactions  . Cefuroxime Axetil Other (See Comments)    Extreme gas  . Gabapentin Other (See Comments)    Constipation Constipation  . Pregabalin Other (See Comments)    Drowsiness, dry mouth Drowsiness, dry mouth Makes her tired and thirsty  . Augmentin [Amoxicillin-Pot Clavulanate] Diarrhea  . Avelox [Moxifloxacin Hcl In Nacl] Other (See Comments)    Tremors   . Biaxin [Clarithromycin]     Unsure of reaction  . Cedax [Ceftibuten] Other (See Comments)    tremors  . Ciprofloxacin Other (See Comments) and Nausea And Vomiting    Blurred vision Blurred vision Pt can't remember reaction  . Clindamycin/Lincomycin     tachycardia  . Levofloxacin Other (See Comments)    Feels  dehydrated  . Lincomycin Other (See Comments)    tachycardia  . Montelukast Sodium Other (See Comments)    Numbness and tingling in hand. Numbness and tingling in hand.  . Sulfonamide Derivatives Other (See Comments)    Gi upset   Past Medical History:  Diagnosis Date  . Acid reflux   . Allergy   . Arthritis    ARTHRITIS IN NECK BY DR. Alroy Dust [facility]  . Asthma   . Interstitial cystitis   . Osteoporosis   . PONV (postoperative nausea and vomiting)   . Tinnitus   . Trigeminal neuralgia    Atypical trigeminal neuralgia    Current Outpatient Medications:  .  acetaminophen (TYLENOL) 500 MG tablet, Take by mouth., Disp: , Rfl:  .  albuterol (PROVENTIL HFA;VENTOLIN HFA) 108 (90 Base) MCG/ACT inhaler, INHALE TWO PUFFS EVERY FOUR TO SIX HOURS AS NEEDED FOR COUGH OR WHEEZE., Disp: 18 g, Rfl: 1 .  amitriptyline (ELAVIL) 10 MG tablet, TAKE 1 TABLET BY MOUTH EVERY DAY AT NIGHT, Disp: , Rfl: 11 .  ARNUITY ELLIPTA 200 MCG/ACT AEPB, TAKE 1 PUFF BY MOUTH EVERY DAY, Disp: 30 each, Rfl: 2 .  azelastine (ASTELIN) 0.1 % nasal spray, PLACE 2 SPRAYS INTO BOTH NOSTRILS 2 (TWO) TIMES DAILY. USE IN EACH NOSTRIL AS DIRECTED, Disp: 30 mL, Rfl: 2 .  b complex vitamins capsule, Take 1 capsule by mouth daily., Disp: ,  Rfl:  .  benzonatate (TESSALON) 200 MG capsule, TAKE 1 CAPSULE (200 MG TOTAL) BY MOUTH 2 (TWO) TIMES DAILY AS NEEDED FOR COUGH., Disp: 40 capsule, Rfl: 0 .  bisacodyl (DULCOLAX) 5 MG EC tablet, Take 5 mg by mouth daily as needed for moderate constipation., Disp: , Rfl:  .  calcium citrate-vitamin D (CITRACAL+D) 315-200 MG-UNIT tablet, Take 2 tablets by mouth 2 (two) times daily., Disp: , Rfl:  .  dextromethorphan-guaiFENesin (MUCINEX DM) 30-600 MG 12hr tablet, Take 1 tablet by mouth 2 (two) times daily., Disp: , Rfl:  .  Ergocalciferol (VITAMIN D2) 2000 units TABS, Take 2,000 Units by mouth daily., Disp: , Rfl:  .  escitalopram (LEXAPRO) 10 MG tablet, Take 1 tablet (10 mg total) by mouth daily.,  Disp: 30 tablet, Rfl: 5 .  lidocaine (XYLOCAINE) 2 % jelly, Apply topically as needed., Disp: 30 mL, Rfl: 10 .  lidocaine (XYLOCAINE) 5 % ointment, Apply daily as needed, Disp: , Rfl:  .  LORazepam (ATIVAN) 0.5 MG tablet, Take 1 tablet (0.5 mg total) by mouth 2 (two) times daily as needed for anxiety., Disp: 30 tablet, Rfl: 1 .  Methylcellulose, Laxative, (CITRUCEL PO), Take by mouth., Disp: , Rfl:  .  mometasone (ELOCON) 0.1 % ointment, 1 application to skin lesions twice daily until resolved., Disp: 45 g, Rfl: 0 .  mometasone (NASONEX) 50 MCG/ACT nasal spray, Place 2 sprays into the nose daily., Disp: 17 g, Rfl: 5 .  ondansetron (ZOFRAN) 4 MG tablet, Take 1 tablet (4 mg total) by mouth every 8 (eight) hours as needed for nausea or vomiting., Disp: 20 tablet, Rfl: 0 .  pantoprazole (PROTONIX) 40 MG tablet, Take 1 tablet (40 mg total) by mouth daily., Disp: 30 tablet, Rfl: 5 .  phenylephrine (SUDAFED PE) 10 MG TABS tablet, Take 10 mg by mouth every 4 (four) hours as needed., Disp: , Rfl:  .  Polyethylene Glycol 3350 (MIRALAX PO), Take by mouth as needed., Disp: , Rfl:  .  Probiotic Product (PROBIOTIC PO), Take by mouth daily., Disp: , Rfl:  .  Simethicone (GAS-X PO), Take by mouth., Disp: , Rfl:  .  tizanidine (ZANAFLEX) 2 MG capsule, Take 1-2 capsules (2-4 mg total) by mouth 3 (three) times daily as needed for muscle spasms., Disp: 60 capsule, Rfl: 2 .  traMADol (ULTRAM) 50 MG tablet, Take 1 tablet (50 mg total) by mouth every 12 (twelve) hours as needed., Disp: 30 tablet, Rfl: 1 .  UNABLE TO FIND, Take 1 capsule by mouth daily. Dv3- vitamin D 3 plus immune support, Disp: , Rfl:  Social History   Socioeconomic History  . Marital status: Married    Spouse name: Not on file  . Number of children: 3  . Years of education: 59  . Highest education level: Some college, no degree  Occupational History  . Occupation: CNA    Comment: Retired  Scientific laboratory technician  . Financial resource strain: Not on file   . Food insecurity:    Worry: Never true    Inability: Never true  . Transportation needs:    Medical: No    Non-medical: No  Tobacco Use  . Smoking status: Never Smoker  . Smokeless tobacco: Never Used  Substance and Sexual Activity  . Alcohol use: No    Alcohol/week: 0.0 standard drinks    Comment: 01-19-2016 per pt no  . Drug use: No    Comment: 01-19-2016 per pt no   . Sexual activity: Never  Lifestyle  .  Physical activity:    Days per week: 0 days    Minutes per session: 0 min  . Stress: Not at all  Relationships  . Social connections:    Talks on phone: More than three times a week    Gets together: More than three times a week    Attends religious service: Never    Active member of club or organization: No    Attends meetings of clubs or organizations: Never    Relationship status: Married  . Intimate partner violence:    Fear of current or ex partner: No    Emotionally abused: No    Physically abused: No    Forced sexual activity: No  Other Topics Concern  . Not on file  Social History Narrative   Lives with husband.    Family History  Problem Relation Age of Onset  . Hyperlipidemia Mother   . Arthritis Mother   . Cancer Mother   . Lymphoma Mother   . Cancer Father   . Allergies Father   . Allergies Sister   . Allergies Sister   . Allergic rhinitis Neg Hx   . Angioedema Neg Hx   . Asthma Neg Hx   . Eczema Neg Hx   . Immunodeficiency Neg Hx   . Urticaria Neg Hx     Objective: Office vital signs reviewed. BP 105/69   Pulse 78   Temp (!) 97.2 F (36.2 C)   Ht 5\' 9"  (1.753 m)   Wt 110 lb (49.9 kg)   BMI 16.24 kg/m   Physical Examination:  General: Awake, alert, thin female, No acute distress HEENT: Normal    Nose: nasal turbinates moist, clear nasal discharge    Throat: moist mucus membranes, cobblestoning of oropharynx noted airway is patent Cardio: regular rate and rhythm, S1S2 heard, no murmurs appreciated Pulm: clear to auscultation  bilaterally, no wheezes, rhonchi or rales; normal work of breathing on room air Psych: Mood stable.  Patient is somewhat anxious.    Assessment/ Plan: 68 Kristy.o. female   1. Bilateral temporomandibular joint pain I have placed referrals to both the oral surgeon and physical therapists of her request.  I have switched her Lexapro to Cymbalta and would like to have her follow-up as scheduled in 2 months for recheck. - Ambulatory referral to Physical Therapy - Ambulatory referral to Oral Maxillofacial Surgery  2. Bruxism, sleep-related As above.  Continue nightguard - Ambulatory referral to Physical Therapy - Ambulatory referral to Oral Maxillofacial Surgery  3. Chronic cough Seems to be related to allergy symptoms and onset about 2 weeks ago.  She has not had any infectious symptoms and physical exam is unremarkable except for cobblestoning of the oropharynx. - benzonatate (TESSALON) 200 MG capsule; Take 1 capsule (200 mg total) by mouth 2 (two) times daily as needed for cough.  Dispense: 40 capsule; Refill: 0  4. Adult BMI <19 kg/sq m May be related to underlying anxiety disorder.  5. Adjustment disorder with anxious mood Cymbalta as above.   Orders Placed This Encounter  Procedures  . Ambulatory referral to Physical Therapy    Referral Priority:   Routine    Referral Type:   Physical Medicine    Referral Reason:   Specialty Services Required    Requested Specialty:   Physical Therapy    Number of Visits Requested:   1  . Ambulatory referral to Oral Maxillofacial Surgery    Referral Priority:   Routine    Referral Type:  Surgical    Referral Reason:   Specialty Services Required    Requested Specialty:   Oral Surgery    Number of Visits Requested:   1   Meds ordered this encounter  Medications  . benzonatate (TESSALON) 200 MG capsule    Sig: Take 1 capsule (200 mg total) by mouth 2 (two) times daily as needed for cough.    Dispense:  40 capsule    Refill:  0  . DULoxetine  (CYMBALTA) 20 MG capsule    Sig: Take 1 capsule (20 mg total) by mouth daily.    Dispense:  30 capsule    Refill:  [place], [place] [phone]

## 2018-04-13 NOTE — Patient Instructions (Signed)
Serotonin Syndrome  Serotonin is a chemical in your body (neurotransmitter) that helps to control several functions, such as:  · Brain and nerve cell function.  · Mood and emotions.  · Memory.  · Eating.  · Sleeping.  · Sexual activity.  · Stress response.  Having too much serotonin in your body can cause serotonin syndrome. This condition can be harmful to your brain and nerve cells. This can be a life-threatening condition.  What are the causes?  This condition may be caused by taking medicines or drugs that increase the level of serotonin in your body, such as:  · Antidepressant medicines.  · Migraine medicines.  · Certain pain medicines.  · Certain drugs, including ecstasy, LSD, cocaine, and amphetamines.  · Over-the-counter cough or cold medicines that contain dextromethorphan.  · Certain herbal supplements, including St. John's wort, ginseng, and nutmeg.  This condition usually occurs when you take these medicines or drugs in combination, but it can also happen with a high dose of a single medicine or drug.  What increases the risk?  You are more likely to develop this condition if:  · You just started taking a medicine or drug that increases the level of serotonin in the body.  · You recently increased the dose of a medicine or drug that increases the level of serotonin in the body.  · You take more than one medicine or drug that increases the level of serotonin in the body.  What are the signs or symptoms?  Symptoms of this condition usually start within several hours of taking a medicine or drug. Symptoms may be mild or severe. Mild symptoms include:  · Sweating.  · Restlessness or agitation.  · Muscle twitching or stiffness.  · Rapid heart rate.  · Nausea and vomiting.  · Diarrhea.  · Headache.  · Shivering or goose bumps.  · Confusion.  Severe symptoms include:  · Irregular heartbeat.  · Seizures.  · Loss of consciousness.  · High fever.  How is this diagnosed?  This condition may be diagnosed based  on:  · Your medical history.   · A physical exam.  · Your prior use of drugs and medicines.  · Blood or urine tests. These may be used to rule out other causes of your symptoms.  How is this treated?  The treatment for this condition depends on the severity of your symptoms.  · For mild cases, stopping the medicine or drug that caused your condition is usually all that is needed.  · For moderate to severe cases, treatment in a hospital may be needed to prevent or manage life-threatening symptoms. This may include medicines to control your symptoms, IV fluids, interventions to support your breathing, and treatments to control your body temperature.  Follow these instructions at home:  Medicines    · Take over-the-counter and prescription medicines only as told by your health care provider. This is important.  · Check with your health care provider before you start taking any new prescriptions, over-the-counter medicines, herbs, or supplements.  · Avoid combining any medicines that can cause this condition to occur.  Lifestyle    · Maintain a healthy lifestyle.  ? Eat a healthy diet that includes plenty of vegetables, fruits, whole grains, low-fat dairy products, and lean protein. Do not eat a lot of foods that are high in fat, added sugars, or salt.  ? Get the right amount and quality of sleep. Most adults need 7-9 hours of sleep each night.  ?   Make time to exercise, even if it is only for short periods of time. Most adults should exercise for at least 150 minutes each week.  ? Do not drink alcohol.  ? Do not use illegal drugs, and do not take medicines for reasons other than they are prescribed.  General instructions  · Do not use any products that contain nicotine or tobacco, such as cigarettes and e-cigarettes. If you need help quitting, ask your health care provider.  · Keep all follow-up visits as told by your health care provider. This is important.  Contact a health care provider if:  · Your symptoms do not  improve or they get worse.  Get help right away if you:  · Have worsening confusion, severe headache, chest pain, high fever, seizures, or loss of consciousness.  · Experience serious side effects of medicine, such as swelling of your face, lips, tongue, or throat.  · Have serious thoughts about hurting yourself or others.  These symptoms may represent a serious problem that is an emergency. Do not wait to see if the symptoms will go away. Get medical help right away. Call your local emergency services (911 in the [place] Do not drive yourself to the hospital.  If you ever feel like you may hurt yourself or others, or have thoughts about taking your own life, get help right away. You can go to your nearest emergency department or call:  · Your local emergency services (911 in the [place]  · ·A suicide crisis helpline, such as the National Suicide Prevention Lifeline at 1-800-273-8255. This is open 24 hours a day.  Summary  · Serotonin is a brain chemical that helps to regulate the nervous system. High levels of serotonin in the body can cause serotonin syndrome, which is a very dangerous condition.  · This condition may be caused by taking medicines or drugs that increase the level of serotonin in your body.  · Treatment depends on the severity of your symptoms. For mild cases, stopping the medicine or drug that caused your condition is usually all that is needed.  · Check with your health care provider before you start taking any new prescriptions, over-the-counter medicines, herbs, or supplements.  This information is not intended to replace advice given to you by your health care provider. Make sure you discuss any questions you have with your health care provider.  Document Released: 04/21/2004 Document Revised: 04/21/2017 Document Reviewed: 04/21/2017  [employer] Interactive Patient Education © 2019 [employer]

## 2018-04-17 ENCOUNTER — Ambulatory Visit: Payer: Medicare HMO | Admitting: Physical Therapy

## 2018-04-19 ENCOUNTER — Ambulatory Visit: Payer: Self-pay | Admitting: Physical Therapy

## 2018-04-19 DIAGNOSIS — R1030 Lower abdominal pain, unspecified: Secondary | ICD-10-CM | POA: Diagnosis not present

## 2018-04-19 DIAGNOSIS — N3010 Interstitial cystitis (chronic) without hematuria: Secondary | ICD-10-CM | POA: Diagnosis not present

## 2018-04-20 ENCOUNTER — Other Ambulatory Visit: Payer: Self-pay | Admitting: Allergy and Immunology

## 2018-04-20 DIAGNOSIS — J019 Acute sinusitis, unspecified: Secondary | ICD-10-CM

## 2018-04-23 ENCOUNTER — Encounter: Payer: Self-pay | Admitting: Physical Therapy

## 2018-04-23 ENCOUNTER — Ambulatory Visit: Payer: Medicare HMO | Attending: Family Medicine | Admitting: Physical Therapy

## 2018-04-23 ENCOUNTER — Other Ambulatory Visit: Payer: Self-pay

## 2018-04-23 DIAGNOSIS — R293 Abnormal posture: Secondary | ICD-10-CM | POA: Insufficient documentation

## 2018-04-23 DIAGNOSIS — M62838 Other muscle spasm: Secondary | ICD-10-CM | POA: Diagnosis not present

## 2018-04-23 DIAGNOSIS — R279 Unspecified lack of coordination: Secondary | ICD-10-CM | POA: Diagnosis not present

## 2018-04-23 NOTE — Therapy (Signed)
[facility] [address] [place], [place], [address] Phone: [phone]   Fax:  [phone]  Physical Therapy Evaluation  Patient Details  Name: Kristy Garza MRN: [id number] Date of Birth: 08/06/1950 Referring Provider (PT): Dr. [name]. Gottschalk   Encounter Date: 04/23/2018  PT End of Session - 04/23/18 1145    Visit Number  1    Date for PT Re-Evaluation  06/18/18    Authorization Type  [employer] medicare    PT Start Time  1100    PT Stop Time  1142    PT Time Calculation (min)  42 min    Activity Tolerance  Patient tolerated treatment well    Behavior During Therapy  WFL for tasks assessed/performed       Past Medical History:  Diagnosis Date  . Acid reflux   . Allergy   . Arthritis    ARTHRITIS IN NECK BY DR. Alroy Dust [facility]  . Asthma   . Interstitial cystitis   . Osteoporosis   . PONV (postoperative nausea and vomiting)   . Tinnitus   . Trigeminal neuralgia    Atypical trigeminal neuralgia    Past Surgical History:  Procedure Laterality Date  . ABDOMINAL HYSTERECTOMY  2000  . CYSTOSCOPY WITH HYDRODISTENSION AND BIOPSY N/A 06/11/2012   Procedure: CYSTOSCOPY/BIOPSY/HYDRODISTENSION with Instillation of Pyridium and Marcaine and Kenalog;  Surgeon: Ailene Rud, MD;  Location: [facility];  Service: Urology;  Laterality: N/A;  1 hour requested for this case  BLADDER BIOPSY  . ETHMOIDECTOMY  2012  . SEPTOPLASTY  1980's  . vocal cord surgery   1990's   polyp removal    There were no vitals filed for this visit.   Subjective Assessment - 04/23/18 1106    Subjective  Last week woke up with tightness in the TMJ, teeth hurt.  Patient has a night brace.     Pertinent History  OP; TMJ; COPD.    Patient Stated Goals  have less pain in TMJ    Currently in Pain?  Yes    Pain Score  3     Pain Location  Jaw    Pain Orientation  Right;Left   Left > right   Pain Descriptors /  Indicators  Dull    Pain Type  Chronic pain    Pain Onset  More than a month ago    Pain Frequency  Constant    Aggravating Factors   eating, in the morning when wake up, yawning, talking    Pain Relieving Factors  heat    Multiple Pain Sites  Yes    Pain Score  5    Pain Location  Head    Pain Orientation  Right;Left    Pain Descriptors / Indicators  Dull    Pain Type  Chronic pain    Pain Onset  More than a month ago    Pain Frequency  Intermittent    Aggravating Factors   wake up with headache    Pain Relieving Factors  rest         [facility] PT Assessment - 04/23/18 0001      Assessment   Medical Diagnosis  M26.623 Bilateral temporomandibular joint pain; 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 [name]. Gottschalk    Prior Therapy  for neck pain      Precautions   Precautions  Other (comment)    Precaution Comments  osteoporosis  Restrictions   Weight Bearing Restrictions  No      Balance Screen   Has the patient fallen in the past 6 months  No    Has the patient had a decrease in activity level because of a fear of falling?   No    Is the patient reluctant to leave their home because of a fear of falling?   No      Home Film/video editor residence      Prior Function   Level of Independence  Independent    Leisure  shopping, reading, cooking, baking      Cognition   Overall Cognitive Status  Within Functional Limits for tasks assessed      Observation/Other Assessments   Focus on Therapeutic Outcomes (FOTO)   53% limitation; goal is 45% limitation      Posture/Postural Control   Posture/Postural Control  Postural limitations    Postural Limitations  Rounded Shoulders;Forward head   moderately     ROM / Strength   AROM / PROM / Strength  AROM;PROM;Strength      AROM   Overall AROM Comments  when open mouth C-shape movement wiht concave side to the left, midline of jaw is 0.5 cm to the left; opening mouth 40  mm; move mandible to the right is 15 mm; move the mandible to the left is 12 mm    Cervical Extension  decreased by 50%      Palpation   Palpation comment  feel a pop when closing her mouth on bil. TMJ tenderness located on bil. lateraly pterygoid, masseter, temoporalis, TMJ joint, under the mouth, bil. suboccipitals, and cervical paraspinals                Objective measurements completed on examination: See above findings.              PT Education - 04/23/18 1145    Education Details  information on TMJ, opening mouth minimal deviation for muscle re-education    Person(s) Educated  Patient    Methods  Explanation;Demonstration;Handout    Comprehension  Returned demonstration;Verbalized understanding       PT Short Term Goals - 04/23/18 1200      PT SHORT TERM GOAL #1   Title  be independent in initial HEP    Time  4    Period  Weeks    Status  New    Target Date  05/21/18      PT SHORT TERM GOAL #2   Title  report a 30% reduction in frequency and intenisty of headaches    Time  4    Period  Weeks    Status  New    Target Date  05/21/18      PT SHORT TERM GOAL #3   Title  report 30% less pain in TMJ with chewing    Time  4    Period  Weeks    Status  New    Target Date  05/21/18      PT SHORT TERM GOAL #4   Title  reports 30% leas pain with yawning due to reduction of lower mandible deviation    Time  4    Period  Weeks    Status  New    Target Date  05/21/18        PT Long Term Goals - 04/23/18 1205      PT LONG TERM GOAL #1   Title  be independent in advanced HEP    Time  6    Status  New    Target Date  06/18/18      PT LONG TERM GOAL #2   Title  reduction of intensity of headaches when she wakes up </= 1-2/10    Time  8    Period  Weeks    Status  New    Target Date  06/18/18      PT LONG TERM GOAL #3   Title  pain with chewing decreased >/= 1-2/10 due to reduction of muscle spasms    Time  8    Period  Weeks    Status   New    Target Date  06/18/18      PT LONG TERM GOAL #4   Title  talk with pain </= 1-2/10 due to less deviaiton of the lower mandible    Time  8    Period  Weeks    Status  New    Target Date  06/18/18      PT LONG TERM GOAL #5   Title  yawning with pain </= 1-2/10 due to reduction of muscle spasms    Time  8    Period  Weeks    Status  New    Target Date  06/18/18             Plan - 04/23/18 1146    Clinical Impression Statement  Patient is a 68 year old female with chronic TMJ and neck pain for the past 6 months. Patient has 3/10 constant TMJ pain and 5/10 with intermittent headaches. Pain is worse with chewing, talking, waking up in the morning, and yawning.  Patient has full opening of her mouth but has a C deviation with concave side to the left. Popping is felt in the right TMJ when she is closing her mouth. Patient mandible it 5 mm to the right with rest. Tenderness located on bilateral TMJ, masseter, lateral pterygoid, temporalis, suboccipital and cervical paraspinals. Patient posture consists of moderate foward head and rounded shoulders and rounded spine. Patient is able to correct her posture with verbal cues. Patient will benefit from skilled therapy to dry needling the muscles to reduce spasms, correct deviations of the Jaw, improve posture to reduce strain on TMJ and improve function.     History and Personal Factors relevant to plan of care:  osteoporosis, TMJ, IC, headaches    Clinical Presentation  Evolving    Clinical Presentation due to:  pain is making it difficult to eat, perform daily tasks, talking, and yawning    Clinical Decision Making  Moderate    Rehab Potential  Good    Clinical Impairments Affecting Rehab Potential  osteoporosis    PT Frequency  2x / week    PT Duration  8 weeks    PT Treatment/Interventions  Cryotherapy;Iontophoresis 4mg /ml Dexamethasone;Moist Heat;Ultrasound;Neuromuscular re-education;Therapeutic exercise;Therapeutic  activities;Patient/family education;Manual techniques;Dry needling;Taping    PT Next Visit Plan  dry needling to temporalis, masseter, lateral pterygoid, subocciptals, and cervical paraspinals: soft tissue work to TMJ muscles and neck,; postural muscle strength    Consulted and Agree with Plan of Care  Patient       Patient will benefit from skilled therapeutic intervention in order to improve the following deficits and impairments:  Decreased activity tolerance, Decreased range of motion, Postural dysfunction, Pain, Increased fascial restricitons, Decreased mobility, Increased muscle spasms, Decreased strength  Visit Diagnosis: Other muscle spasm - Plan: PT plan  of care cert/re-cert  Abnormal posture - Plan: PT plan of care cert/re-cert  Unspecified lack of coordination - Plan: PT plan of care cert/re-cert     Problem List Patient Active Problem List   Diagnosis Date Noted  . Adult BMI <19 kg/sq m 04/13/2018  . Pharyngitis 01/09/2018  . Anxiety state 01/19/2016  . Adjustment disorder with anxious mood 01/19/2016  . HLD (hyperlipidemia) 01/07/2016  . IBS (irritable bowel syndrome) 07/11/2015  . Functional constipation 06/22/2015  . Chronic bronchitis ([place]) 05/07/2015  . 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 Trigeminal neuralgia 04/17/2014  . Face pain 01/22/2014  . 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 Asthma, chronic 05/13/2013  . Generalized anxiety disorder 05/13/2013  . Difficulty speaking 04/11/2013  . 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 Leg cramps 03/23/2013  . Acid reflux 03/11/2013  . Osteoporosis with pathological fracture 01/30/2013  . Abdominal pain 01/16/2013  . Back ache 01/16/2013  . Chronic interstitial cystitis 01/16/2013  . FOM (frequency of micturition) 01/16/2013  . Seasonal allergic rhinitis 07/07/2012  . G E R D 08/15/2007  . VOCAL CORD POLYP, HX OF 08/15/2007    Earlie Counts, PT 04/23/18 12:14 PM   [facility] [address] [place], [place], [address] Phone: [phone]   Fax:  [phone]  Name: STEPANIE GRAVER MRN: [id number] Date of Birth: 08-12-1950

## 2018-04-23 NOTE — Patient Instructions (Signed)
Temporomandibular Joint Syndrome  Temporomandibular joint syndrome (TMJ syndrome) is a condition that causes pain in the temporomandibular joints. These joints are located near your ears and allow your jaw to open and close. For people with TMJ syndrome, chewing, biting, or other movements of the jaw can be difficult or painful. TMJ syndrome is often mild and goes away within a few weeks. However, sometimes the condition becomes a long-term (chronic) problem. What are the causes? This condition may be caused by:  Grinding your teeth or clenching your jaw. Some people do this when they are under stress.  Arthritis.  Injury to the jaw.  Head or neck injury.  Teeth or dentures that are not aligned well. In some cases, the cause of TMJ syndrome may not be known. What are the signs or symptoms? The most common symptom of this condition is an aching pain on the side of the head in the area of the TMJ. Other symptoms may include:  Pain when moving your jaw, such as when chewing or biting.  Being unable to open your jaw all the way.  Making a clicking sound when you open your mouth.  Headache.  Earache.  Neck or shoulder pain. How is this diagnosed? This condition may be diagnosed based on:  Your symptoms and medical history.  A physical exam. Your health care provider may check the range of motion of your jaw.  Imaging tests, such as X-rays or an MRI. You may also need to see your dentist, who will determine if your teeth and jaw are lined up correctly. How is this treated? TMJ syndrome often goes away on its own. If treatment is needed, the options may include:  Eating soft foods and applying ice or heat.  Medicines to relieve pain or inflammation.  Medicines or massage to relax the muscles.  A splint, bite plate, or mouthpiece to prevent teeth grinding or jaw clenching.  Relaxation techniques or counseling to help reduce stress.  A therapy for pain in which an  electrical current is applied to the nerves through the skin (transcutaneous electrical nerve stimulation).  Acupuncture. This is sometimes helpful to relieve pain.  Jaw surgery. This is rarely needed. Follow these instructions at home:  Eating and drinking  Eat a soft diet if you are having trouble chewing.  Avoid foods that require a lot of chewing. Do not chew gum. General instructions  Take over-the-counter and prescription medicines only as told by your health care provider.  If directed, put ice on the painful area. ? Put ice in a plastic bag. ? Place a towel between your skin and the bag. ? Leave the ice on for 20 minutes, 2-3 times a day.  Apply a warm, wet cloth (warm compress) to the painful area as directed.  Massage your jaw area and do any jaw stretching exercises as told by your health care provider.  If you were given a splint, bite plate, or mouthpiece, wear it as told by your health care provider.  Keep all follow-up visits as told by your health care provider. This is important. Contact a health care provider if:  You are having trouble eating.  You have new or worsening symptoms. Get help right away if:  Your jaw locks open or closed. Summary  Temporomandibular joint syndrome (TMJ syndrome) is a condition that causes pain in the temporomandibular joints. These joints are located near your ears and allow your jaw to open and close.  TMJ syndrome is often mild and   goes away within a few weeks. However, sometimes the condition becomes a long-term (chronic) problem.  Symptoms include an aching pain on the side of the head in the area of the TMJ, pain when chewing or biting, and being unable to open your jaw all the way. You may also make a clicking sound when you open your mouth.  TMJ syndrome often goes away on its own. If treatment is needed, it may include medicines to relieve pain, reduce inflammation, or relax the muscles. A splint, bite plate, or  mouthpiece may also be used to prevent teeth grinding or jaw clenching. This information is not intended to replace advice given to you by your health care provider. Make sure you discuss any questions you have with your health care provider. Document Released: 12/07/2000 Document Revised: 04/25/2017 Document Reviewed: 04/25/2017 Elsevier Interactive Patient Education  2019 [employer].   Keep tongue on roof of mouth,  Sit upright When opening mouth keep the middle of the top teeth and lower in align Open mouth 5 times, hold 5 seconds, 5 times per day  [facility] [address] [place], [place] [address] Phone # [phone] Fax [phone]

## 2018-04-24 DIAGNOSIS — R69 Illness, unspecified: Secondary | ICD-10-CM | POA: Diagnosis not present

## 2018-04-26 ENCOUNTER — Ambulatory Visit: Payer: Medicare HMO | Admitting: Physical Therapy

## 2018-04-27 DIAGNOSIS — N3010 Interstitial cystitis (chronic) without hematuria: Secondary | ICD-10-CM | POA: Diagnosis not present

## 2018-05-01 ENCOUNTER — Ambulatory Visit: Payer: Medicare HMO | Attending: Family Medicine | Admitting: Physical Therapy

## 2018-05-01 ENCOUNTER — Encounter: Payer: Self-pay | Admitting: Physical Therapy

## 2018-05-01 DIAGNOSIS — R293 Abnormal posture: Secondary | ICD-10-CM

## 2018-05-01 DIAGNOSIS — R279 Unspecified lack of coordination: Secondary | ICD-10-CM | POA: Diagnosis not present

## 2018-05-01 DIAGNOSIS — K5901 Slow transit constipation: Secondary | ICD-10-CM | POA: Diagnosis not present

## 2018-05-01 DIAGNOSIS — M542 Cervicalgia: Secondary | ICD-10-CM | POA: Diagnosis not present

## 2018-05-01 DIAGNOSIS — M62838 Other muscle spasm: Secondary | ICD-10-CM

## 2018-05-01 DIAGNOSIS — R159 Full incontinence of feces: Secondary | ICD-10-CM | POA: Diagnosis not present

## 2018-05-01 NOTE — Patient Instructions (Addendum)
Trigger Point Dry Needling  . What is Trigger Point Dry Needling (DN)? o DN is a physical therapy technique used to treat muscle pain and dysfunction. Specifically, DN helps deactivate muscle trigger points (muscle knots).  o A thin filiform needle is used to penetrate the skin and stimulate the underlying trigger point. The goal is for a local twitch response (LTR) to occur and for the trigger point to relax. No medication of any kind is injected during the procedure.   . What Does Trigger Point Dry Needling Feel Like?  o The procedure feels different for each individual patient. Some patients report that they do not actually feel the needle enter the skin and overall the process is not painful. Very mild bleeding may occur. However, many patients feel a deep cramping in the muscle in which the needle was inserted. This is the local twitch response.   [name] How Will I feel after the treatment? o Soreness is normal, and the onset of soreness may not occur for a few hours. Typically this soreness does not last longer than two days.  o Bruising is uncommon, however; ice can be used to decrease any possible bruising.  o In rare cases feeling tired or nauseous after the treatment is normal. In addition, your symptoms may get worse before they get better, this period will typically not last longer than 24 hours.   . What Can I do After My Treatment? o Increase your hydration by drinking more water for the next 24 hours. o You may place ice or heat on the areas treated that have become sore, however, do not use heat on inflamed or bruised areas. Heat often brings more relief post needling. o You can continue your regular activities, but vigorous activity is not recommended initially after the treatment for 24 hours. o DN is best combined with other physical therapy such as strengthening, stretching, and other therapies.    [facility] [address], [place], [place]  [address] Phone # [phone] Fax [phone]    Posture - Standing   Good posture is important. Avoid slouching and forward head thrust. Maintain curve in low back and align ears over shoulders, hips over ankles.  Pull your belly button in toward your back bone. Posture Tips DO: - stand tall and erect - keep chin tucked in - keep head and shoulders in alignment - check posture regularly in mirror or large window - pull head back against headrest in car seat;  Change your position often.  Sit with lumbar support. DON'T: - slouch or slump while watching TV or reading - sit, stand or lie in one position  for too long;  Sitting is especially hard on the spine so if you sit at a desk/use the computer, then stand up often! Copyright  VHI. All rights reserved.  Posture - Sitting  Sit upright, head facing forward. Try using a roll to support lower back. Keep shoulders relaxed, and avoid rounded back. Keep hips level with knees. Avoid crossing legs for long periods. Copyright  VHI. All rights reserved.  Chronic neck strain can develop because of poor posture and faulty work habits  Postural strain related to slumped sitting and forward head posture is a leading cause of headaches, neck and upper back pain  General strengthening and flexibility exercises are helpful in the treatment of neck pain.  Most importantly, you should learn to correct the posture that may be contributing to chronic pain.   Change positions frequently  Change your work or home environment to improve posture and mechanics.

## 2018-05-01 NOTE — Therapy (Signed)
[facility] [address], [place], [place], [address] Phone: [phone]   Fax:  [phone]  Physical Therapy Treatment  Patient Details  Name: Kristy Garza MRN: [id number] Date of Birth: 05-Dec-1950 Referring Provider (PT): Dr. [name]. Gottschalk   Encounter Date: 05/01/2018  PT End of Session - 05/01/18 1001    Visit Number  2    Number of Visits  12    Date for PT Re-Evaluation  06/18/18    Authorization Type  A[employer]na medicare    PT Start Time  1001    PT Stop Time  1048    PT Time Calculation (min)  47 min    Activity Tolerance  Patient tolerated treatment well    Behavior During Therapy  WFL for tasks assessed/performed       Past Medical History:  Diagnosis Date  . Acid reflux   . Allergy   . Arthritis    ARTHRITIS IN NECK BY DR. Alroy Dust [facility]  . Asthma   . Interstitial cystitis   . Osteoporosis   . PONV (postoperative nausea and vomiting)   . Tinnitus   . Trigeminal neuralgia    Atypical trigeminal neuralgia    Past Surgical History:  Procedure Laterality Date  . ABDOMINAL HYSTERECTOMY  2000  . CYSTOSCOPY WITH HYDRODISTENSION AND BIOPSY N/A 06/11/2012   Procedure: CYSTOSCOPY/BIOPSY/HYDRODISTENSION with Instillation of Pyridium and Marcaine and Kenalog;  Surgeon: Ailene Rud, MD;  Location: [facility];  Service: Urology;  Laterality: N/A;  1 hour requested for this case  BLADDER BIOPSY  . ETHMOIDECTOMY  2012  . SEPTOPLASTY  1980's  . vocal cord surgery   1990's   polyp removal    There were no vitals filed for this visit.  Subjective Assessment - 05/01/18 1004    Subjective  Pt states she is stressed and in pain due to IC.  She still has the TMJ pain and is doing the exercises    Pertinent History  OP; TMJ; COPD.    Patient Stated Goals  have less pain in TMJ    Currently in Pain?  Yes    Pain Score  4     Pain Location  Jaw    Pain Orientation  Right;Left    Pain Descriptors / Indicators  Dull    Pain Type  Chronic pain    Pain Onset  More than a month ago    Pain Frequency  Constant    Multiple Pain Sites  No                       [facility] Adult PT Treatment/Exercise - 05/01/18 0001      Self-Care   Self-Care  Posture    Posture  educated and performed sitting and standing posture with tongue placement      Manual Therapy   Manual Therapy  Soft tissue mobilization;Myofascial release    Soft tissue mobilization  masseter, temporalis, pterygoids, suboccipital and cervical paraspinals    Myofascial Release  suboccipitals       Trigger Point Dry Needling - 05/01/18 1141    Consent Given?  Yes    Education Handout Provided  Yes    Muscles Treated Upper Body  Suboccipitals muscle group;Oblique capitus   masseter, pterygoids, temporalis   Oblique Capitus Response  Twitch response elicited;Palpable increased muscle length    SubOccipitals Response  Twitch response elicited;Palpable increased muscle length  PT Education - 05/01/18 1002    Education Details  DN aftercare and posture    Person(s) Educated  Patient    Methods  Explanation;Handout    Comprehension  Verbalized understanding       PT Short Term Goals - 04/23/18 1200      PT SHORT TERM GOAL #1   Title  be independent in initial HEP    Time  4    Period  Weeks    Status  New    Target Date  05/21/18      PT SHORT TERM GOAL #2   Title  report a 30% reduction in frequency and intenisty of headaches    Time  4    Period  Weeks    Status  New    Target Date  05/21/18      PT SHORT TERM GOAL #3   Title  report 30% less pain in TMJ with chewing    Time  4    Period  Weeks    Status  New    Target Date  05/21/18      PT SHORT TERM GOAL #4   Title  reports 30% leas pain with yawning due to reduction of lower mandible deviation    Time  4    Period  Weeks    Status  New    Target Date  05/21/18        PT Long Term Goals - 04/23/18  1205      PT LONG TERM GOAL #1   Title  be independent in advanced HEP    Time  6    Status  New    Target Date  06/18/18      PT LONG TERM GOAL #2   Title  reduction of intensity of headaches when she wakes up </= 1-2/10    Time  8    Period  Weeks    Status  New    Target Date  06/18/18      PT LONG TERM GOAL #3   Title  pain with chewing decreased >/= 1-2/10 due to reduction of muscle spasms    Time  8    Period  Weeks    Status  New    Target Date  06/18/18      PT LONG TERM GOAL #4   Title  talk with pain </= 1-2/10 due to less deviaiton of the lower mandible    Time  8    Period  Weeks    Status  New    Target Date  06/18/18      PT LONG TERM GOAL #5   Title  yawning with pain </= 1-2/10 due to reduction of muscle spasms    Time  8    Period  Weeks    Status  New    Target Date  06/18/18            Plan - 05/01/18 1152    Clinical Impression Statement  Pt responded well to dry needling and STM with reports of feeling better afterwards.  She was able to demonstrate much improved posture with decreased thoracic kyphosis with verbal cues and using mirror for feedback.  Pt will benefit from skilled PT to continue with posutre and soft tissue length.    PT Treatment/Interventions  Cryotherapy;Iontophoresis 4mg /ml Dexamethasone;Moist Heat;Ultrasound;Neuromuscular re-education;Therapeutic exercise;Therapeutic activities;Patient/family education;Manual techniques;Dry needling;Taping    PT Next Visit Plan  f/u on dry needling and review posture, begin  posutre strengthening, cervical ROM, scap squeezes, soft tissue work     PT Home Exercise Plan  posture     Recommended Other Services  order signed; eval 1/27    Consulted and Agree with Plan of Care  Patient       Patient will benefit from skilled therapeutic intervention in order to improve the following deficits and impairments:  Decreased activity tolerance, Decreased range of motion, Postural dysfunction, Pain,  Increased fascial restricitons, Decreased mobility, Increased muscle spasms, Decreased strength  Visit Diagnosis: Other muscle spasm  Abnormal posture  Unspecified lack of coordination  Cervicalgia     Problem List Patient Active Problem List   Diagnosis Date Noted  . Adult BMI <19 kg/sq m 04/13/2018  . Pharyngitis 01/09/2018  . Anxiety state 01/19/2016  . Adjustment disorder with anxious mood 01/19/2016  . HLD (hyperlipidemia) 01/07/2016  . IBS (irritable bowel syndrome) 07/11/2015  . Functional constipation 06/22/2015  . Chronic bronchitis ([place]) 05/07/2015  . 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 Trigeminal neuralgia 04/17/2014  . Face pain 01/22/2014  . 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 Asthma, chronic 05/13/2013  . Generalized anxiety disorder 05/13/2013  . Difficulty speaking 04/11/2013  . 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 Leg cramps 03/23/2013  . Acid reflux 03/11/2013  . Osteoporosis with pathological fracture 01/30/2013  . Abdominal pain 01/16/2013  . Back ache 01/16/2013  . Chronic interstitial cystitis 01/16/2013  . FOM (frequency of micturition) 01/16/2013  . Seasonal allergic rhinitis 07/07/2012  . G E R D 08/15/2007  . VOCAL CORD POLYP, HX OF 08/15/2007    Zannie Cove, PT 05/01/2018, 12:00 PM  [facility] [address], [place], [place], [address] Phone: [phone]   Fax:  [phone]  Name: Kristy Garza MRN: [id number] Date of Birth: [birth year]

## 2018-05-02 ENCOUNTER — Ambulatory Visit: Payer: Medicare HMO | Admitting: Physical Therapy

## 2018-05-02 DIAGNOSIS — R279 Unspecified lack of coordination: Secondary | ICD-10-CM | POA: Diagnosis not present

## 2018-05-02 DIAGNOSIS — M62838 Other muscle spasm: Secondary | ICD-10-CM

## 2018-05-02 DIAGNOSIS — M542 Cervicalgia: Secondary | ICD-10-CM | POA: Diagnosis not present

## 2018-05-02 DIAGNOSIS — R293 Abnormal posture: Secondary | ICD-10-CM

## 2018-05-02 NOTE — Therapy (Signed)
[facility] [address] [place], [place], [address] Phone: [phone]   Fax:  [phone]  Physical Therapy Treatment  Patient Details  Name: Kristy Garza MRN: [id number] Date of Birth: 09/29/1950 Referring Provider (PT): Dr. [name]. Gottschalk   Encounter Date: 05/02/2018  PT End of Session - 05/02/18 0845    Visit Number  3    Number of Visits  12    Date for PT Re-Evaluation  06/18/18    Authorization Type  Aetna medicare    PT Start Time  [phone]    PT Stop Time  0927    PT Time Calculation (min)  41 min    Activity Tolerance  Patient tolerated treatment well    Behavior During Therapy  [facility] for tasks assessed/performed       Past Medical History:  Diagnosis Date  . Acid reflux   . Allergy   . Arthritis    ARTHRITIS IN NECK BY DR. Alroy Dust [facility]  . Asthma   . Interstitial cystitis   . Osteoporosis   . PONV (postoperative nausea and vomiting)   . Tinnitus   . Trigeminal neuralgia    Atypical trigeminal neuralgia    Past Surgical History:  Procedure Laterality Date  . ABDOMINAL HYSTERECTOMY  2000  . CYSTOSCOPY WITH HYDRODISTENSION AND BIOPSY N/A 06/11/2012   Procedure: CYSTOSCOPY/BIOPSY/HYDRODISTENSION with Instillation of Pyridium and Marcaine and Kenalog;  Surgeon: Ailene Rud, MD;  Location: [facility];  Service: Urology;  Laterality: N/A;  1 hour requested for this case  BLADDER BIOPSY  . ETHMOIDECTOMY  2012  . SEPTOPLASTY  1980's  . vocal cord surgery   1990's   polyp removal    There were no vitals filed for this visit.  Subjective Assessment - 05/02/18 0847    Subjective  Pt states she is having some pain around the cheek bones and a little headache in the forehead region and pain behind left ear.      Patient Stated Goals  have less pain in TMJ    Currently in Pain?  Yes    Pain Score  4     Pain Location  Head    Pain Orientation  Right;Left    Pain  Descriptors / Indicators  Aching    Pain Onset  More than a month ago    Pain Frequency  Constant    Aggravating Factors   nothing increased pain this morning    Pain Relieving Factors  nothing    Multiple Pain Sites  No                       [facility] Adult PT Treatment/Exercise - 05/02/18 0001      Neuro Re-ed    Neuro Re-ed Details   posture with exercises      Exercises   Exercises  Neck      Neck Exercises: Standing   Other Standing Exercises  shoulder row - red band; shoulder ext - yellow band - 20x each - cued for posture and shoulder position    Other Standing Exercises  standing posture with foam roller behind - jaw abduction with tongue behind teeth      Neck Exercises: Seated   Neck Retraction  10 reps;3 secs    Cervical Rotation  Right;Left;5 reps   10 sec hold   Lateral Flexion  Right;Left;5 reps   10 sec hold   Shoulder Rolls  Backwards;10  reps    Shoulder ABduction  Both;20 reps    Shoulder Abduction Limitations  red band    Postural Training  thoracic rotation and ext; educated with p    Other Seated Exercise  scap squeezes 10 x 5 sec hold    Other Seated Exercise  chin tuck with extension - 10x 5 sec hold             PT Education - 05/02/18 0926    Education Details   Access Code: [email or web address]     Person(s) Educated  Patient    Methods  Explanation;Demonstration;Handout    Comprehension  Verbalized understanding;Returned demonstration       PT Short Term Goals - 04/23/18 1200      PT SHORT TERM GOAL #1   Title  be independent in initial HEP    Time  4    Period  Weeks    Status  New    Target Date  05/21/18      PT SHORT TERM GOAL #2   Title  report a 30% reduction in frequency and intenisty of headaches    Time  4    Period  Weeks    Status  New    Target Date  05/21/18      PT SHORT TERM GOAL #3   Title  report 30% less pain in TMJ with chewing    Time  4    Period  Weeks    Status  New    Target Date  05/21/18      PT  SHORT TERM GOAL #4   Title  reports 30% leas pain with yawning due to reduction of lower mandible deviation    Time  4    Period  Weeks    Status  New    Target Date  05/21/18        PT Long Term Goals - 04/23/18 1205      PT LONG TERM GOAL #1   Title  be independent in advanced HEP    Time  6    Status  New    Target Date  06/18/18      PT LONG TERM GOAL #2   Title  reduction of intensity of headaches when she wakes up </= 1-2/10    Time  8    Period  Weeks    Status  New    Target Date  06/18/18      PT LONG TERM GOAL #3   Title  pain with chewing decreased >/= 1-2/10 due to reduction of muscle spasms    Time  8    Period  Weeks    Status  New    Target Date  06/18/18      PT LONG TERM GOAL #4   Title  talk with pain </= 1-2/10 due to less deviaiton of the lower mandible    Time  8    Period  Weeks    Status  New    Target Date  06/18/18      PT LONG TERM GOAL #5   Title  yawning with pain </= 1-2/10 due to reduction of muscle spasms    Time  8    Period  Weeks    Status  New    Target Date  06/18/18            Plan - 05/02/18 5093    Clinical Impression Statement  Pt reports same level of pain but  also no increased pain with  chewing this morning.  Pt did well with exercises.  She experienced fatigue in shoulder and needs cues to prevent shoulder elevation.  Pt needed cues for posture throughout treatment.  She demonstrates improved jaw position with cervical retraction.  Pt will benefit from skilled PT to work on posture and reduce muscle spasms for improved function.    PT Treatment/Interventions  Cryotherapy;Iontophoresis 4mg /ml Dexamethasone;Moist Heat;Ultrasound;Neuromuscular re-education;Therapeutic exercise;Therapeutic activities;Patient/family education;Manual techniques;Dry needling;Taping    PT Next Visit Plan  dry needling and review posture, posutre strengthening, cervical ROM, scap squeezes, soft tissue work     PT Home Exercise Plan   Access  Code: [email or web address]     Consulted and Agree with Plan of Care  Patient       Patient will benefit from skilled therapeutic intervention in order to improve the following deficits and impairments:  Decreased activity tolerance, Decreased range of motion, Postural dysfunction, Pain, Increased fascial restricitons, Decreased mobility, Increased muscle spasms, Decreased strength  Visit Diagnosis: Other muscle spasm  Abnormal posture  Unspecified lack of coordination  Cervicalgia     Problem List Patient Active Problem List   Diagnosis Date Noted  . Adult BMI <19 kg/sq m 04/13/2018  . Pharyngitis 01/09/2018  . Anxiety state 01/19/2016  . Adjustment disorder with anxious mood 01/19/2016  . HLD (hyperlipidemia) 01/07/2016  . IBS (irritable bowel syndrome) 07/11/2015  . Functional constipation 06/22/2015  . Chronic bronchitis ([place]) 05/07/2015  . 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 Trigeminal neuralgia 04/17/2014  . Face pain 01/22/2014  . 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 Asthma, chronic 05/13/2013  . Generalized anxiety disorder 05/13/2013  . Difficulty speaking 04/11/2013  . 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 Leg cramps 03/23/2013  . Acid reflux 03/11/2013  . Osteoporosis with pathological fracture 01/30/2013  . Abdominal pain 01/16/2013  . Back ache 01/16/2013  . Chronic interstitial cystitis 01/16/2013  . FOM (frequency of micturition) 01/16/2013  . Seasonal allergic rhinitis 07/07/2012  . G E R D 08/15/2007  . VOCAL CORD POLYP, HX OF 08/15/2007    Zannie Cove, PT 05/02/2018, 9:31 AM  [facility] [address] [place], [place], [address] Phone: [phone]   Fax:  [phone]  Name: Kristy Garza MRN: [id number] Date of Birth: [birth year]

## 2018-05-02 NOTE — Patient Instructions (Signed)
Access Code: [email or web address]  URL: [email or web address]  Date: 05/02/2018  Prepared by: Lovett Calender   Exercises  Seated Chin Tuck with Neck Elongation - 10 reps - 1 sets - 5 sec hold - 1x daily - 7x weekly  Seated Cervical Retraction and Rotation - 5 reps - 1 sets - 10 sec hold - 1x daily - 7x weekly  Standing Cervical Retraction with Sidebending - 5 reps - 1 sets - 10 sec hold - 1x daily - 7x weekly  Seated Thoracic Extension with Hands Behind Neck - 10 reps - 1 sets - 5 sec hold - 1x daily - 7x weekly  Jaw Abduction - 10 reps - 3 sets - 1x daily - 7x weekly

## 2018-05-03 ENCOUNTER — Telehealth: Payer: Self-pay | Admitting: Family Medicine

## 2018-05-03 ENCOUNTER — Ambulatory Visit: Payer: Medicare HMO | Admitting: Physical Therapy

## 2018-05-03 NOTE — Telephone Encounter (Signed)
Pt aware of MD feedback and voiced understanding. 

## 2018-05-03 NOTE — Telephone Encounter (Signed)
She may take 1 capsule every other day x7 days then stop.

## 2018-05-08 ENCOUNTER — Ambulatory Visit: Payer: Medicare HMO | Admitting: Physical Therapy

## 2018-05-08 DIAGNOSIS — N3010 Interstitial cystitis (chronic) without hematuria: Secondary | ICD-10-CM | POA: Diagnosis not present

## 2018-05-10 ENCOUNTER — Encounter: Payer: Self-pay | Admitting: Physical Therapy

## 2018-05-10 ENCOUNTER — Ambulatory Visit: Payer: Medicare HMO | Admitting: Physical Therapy

## 2018-05-10 DIAGNOSIS — M62838 Other muscle spasm: Secondary | ICD-10-CM

## 2018-05-10 DIAGNOSIS — R279 Unspecified lack of coordination: Secondary | ICD-10-CM

## 2018-05-10 DIAGNOSIS — M542 Cervicalgia: Secondary | ICD-10-CM | POA: Diagnosis not present

## 2018-05-10 DIAGNOSIS — R293 Abnormal posture: Secondary | ICD-10-CM | POA: Diagnosis not present

## 2018-05-10 NOTE — Therapy (Addendum)
[facility] [address] [place], [place], [address] Phone: [phone]   Fax:  [phone]  Physical Therapy Treatment  Patient Details  Name: Kristy Garza MRN: [id number] Date of Birth: 1950-09-07 Referring Provider (PT): Dr. [name]. Gottschalk   Encounter Date: 05/10/2018  PT End of Session - 05/10/18 1405    Visit Number  4    Number of Visits  12    Date for PT Re-Evaluation  06/18/18    Authorization Type  Aetna medicare    PT Start Time  1405    PT Stop Time  1445    PT Time Calculation (min)  40 min    Activity Tolerance  Patient tolerated treatment well    Behavior During Therapy  WFL for tasks assessed/performed       Past Medical History:  Diagnosis Date  . Acid reflux   . Allergy   . Arthritis    ARTHRITIS IN NECK BY DR. Alroy Dust [facility]  . Asthma   . Interstitial cystitis   . Osteoporosis   . PONV (postoperative nausea and vomiting)   . Tinnitus   . Trigeminal neuralgia    Atypical trigeminal neuralgia    Past Surgical History:  Procedure Laterality Date  . ABDOMINAL HYSTERECTOMY  2000  . CYSTOSCOPY WITH HYDRODISTENSION AND BIOPSY N/A 06/11/2012   Procedure: CYSTOSCOPY/BIOPSY/HYDRODISTENSION with Instillation of Pyridium and Marcaine and Kenalog;  Surgeon: Ailene Rud, MD;  Location: [facility];  Service: Urology;  Laterality: N/A;  1 hour requested for this case  BLADDER BIOPSY  . ETHMOIDECTOMY  2012  . SEPTOPLASTY  1980's  . vocal cord surgery   1990's   polyp removal    There were no vitals filed for this visit.  Subjective Assessment - 05/10/18 1409    Subjective  Pt states she was exhausted after increasing her activities.      Pertinent History  OP; TMJ; COPD.    Patient Stated Goals  have less pain in TMJ    Currently in Pain?  Yes    Pain Score  4     Pain Location  Jaw    Pain Orientation  Right;Left    Pain Onset  More than a month ago    Multiple Pain Sites  No                       [facility] Adult PT Treatment/Exercise - 05/10/18 0001      Manual Therapy   Myofascial Release  suboccipitals, cranial traction, masseter, temporalis       Trigger Point Dry Needling - 05/10/18 1502    Consent Given?  Yes    Muscles Treated Upper Body  Suboccipitals muscle group;Oblique capitus;Sternocleidomastoid   masseter, temporalis, medial pterygoids   Sternocleidomastoid Response  Twitch response elicited;Palpable increased muscle length    Oblique Capitus Response  Twitch response elicited;Palpable increased muscle length    SubOccipitals Response  Twitch response elicited;Palpable increased muscle length             PT Short Term Goals - 04/23/18 1200      PT SHORT TERM GOAL #1   Title  be independent in initial HEP    Time  4    Period  Weeks    Status  New    Target Date  05/21/18      PT SHORT TERM GOAL #2   Title  report a 30% reduction in  frequency and intenisty of headaches    Time  4    Period  Weeks    Status  New    Target Date  05/21/18      PT SHORT TERM GOAL #3   Title  report 30% less pain in TMJ with chewing    Time  4    Period  Weeks    Status  New    Target Date  05/21/18      PT SHORT TERM GOAL #4   Title  reports 30% leas pain with yawning due to reduction of lower mandible deviation    Time  4    Period  Weeks    Status  New    Target Date  05/21/18        PT Long Term Goals - 04/23/18 1205      PT LONG TERM GOAL #1   Title  be independent in advanced HEP    Time  6    Status  New    Target Date  06/18/18      PT LONG TERM GOAL #2   Title  reduction of intensity of headaches when she wakes up </= 1-2/10    Time  8    Period  Weeks    Status  New    Target Date  06/18/18      PT LONG TERM GOAL #3   Title  pain with chewing decreased >/= 1-2/10 due to reduction of muscle spasms    Time  8    Period  Weeks    Status  New    Target Date  06/18/18      PT  LONG TERM GOAL #4   Title  talk with pain </= 1-2/10 due to less deviaiton of the lower mandible    Time  8    Period  Weeks    Status  New    Target Date  06/18/18      PT LONG TERM GOAL #5   Title  yawning with pain </= 1-2/10 due to reduction of muscle spasms    Time  8    Period  Weeks    Status  New    Target Date  06/18/18            Plan - 05/10/18 1618    Clinical Impression Statement  Pt reports reduced symptoms after treatment.  She had good release of all muscles treated as mentioned above.  Pt appeared to have improved jaw movements when talking with less deviation.  She will benefit from skilled PT to continue with POC.    PT Treatment/Interventions  Cryotherapy;Iontophoresis 4mg /ml Dexamethasone;Moist Heat;Ultrasound;Neuromuscular re-education;Therapeutic exercise;Therapeutic activities;Patient/family education;Manual techniques;Dry needling;Taping    PT Next Visit Plan  dry needling and review posture, posutre strengthening, cervical ROM, scap squeezes, soft tissue work     PT Home Exercise Plan   Access Code: [email or web address]     Consulted and Agree with Plan of Care  Patient       Patient will benefit from skilled therapeutic intervention in order to improve the following deficits and impairments:  Decreased activity tolerance, Decreased range of motion, Postural dysfunction, Pain, Increased fascial restricitons, Decreased mobility, Increased muscle spasms, Decreased strength  Visit Diagnosis: Other muscle spasm  Abnormal posture  Unspecified lack of coordination  Cervicalgia     Problem List Patient Active Problem List   Diagnosis Date Noted  . Adult BMI <19 kg/sq m 04/13/2018  . Pharyngitis 01/09/2018  .  Anxiety state 01/19/2016  . Adjustment disorder with anxious mood 01/19/2016  . HLD (hyperlipidemia) 01/07/2016  . IBS (irritable bowel syndrome) 07/11/2015  . Functional constipation 06/22/2015  . Chronic bronchitis ([place]) 05/07/2015  . TMJ arthralgia  11/10/2014  . Trigeminal neuralgia 04/17/2014  . Face pain 01/22/2014  . 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 Asthma, chronic 05/13/2013  . Generalized anxiety disorder 05/13/2013  . Difficulty speaking 04/11/2013  . 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 Leg cramps 03/23/2013  . Acid reflux 03/11/2013  . Osteoporosis with pathological fracture 01/30/2013  . Abdominal pain 01/16/2013  . Back ache 01/16/2013  . Chronic interstitial cystitis 01/16/2013  . FOM (frequency of micturition) 01/16/2013  . Seasonal allergic rhinitis 07/07/2012  . G E R D 08/15/2007  . VOCAL CORD POLYP, HX OF 08/15/2007    Zannie Cove, PT 05/10/2018, 4:20 PM   [facility] [address] [place], [place], [address] Phone: [phone]   Fax:  [phone]  Name: Kristy Garza MRN: [id number] Date of Birth: January 14, 1951

## 2018-05-15 ENCOUNTER — Encounter: Payer: Self-pay | Admitting: Physical Therapy

## 2018-05-17 ENCOUNTER — Encounter: Payer: Self-pay | Admitting: Physical Therapy

## 2018-05-18 ENCOUNTER — Other Ambulatory Visit: Payer: Medicare HMO

## 2018-05-18 ENCOUNTER — Telehealth: Payer: Self-pay | Admitting: Family Medicine

## 2018-05-18 ENCOUNTER — Other Ambulatory Visit: Payer: Self-pay | Admitting: *Deleted

## 2018-05-18 DIAGNOSIS — D649 Anemia, unspecified: Secondary | ICD-10-CM

## 2018-05-18 DIAGNOSIS — R5383 Other fatigue: Secondary | ICD-10-CM

## 2018-05-18 NOTE — Telephone Encounter (Signed)
Ok to come in

## 2018-05-18 NOTE — Telephone Encounter (Signed)
Patient aware and order placed for CBC

## 2018-05-18 NOTE — Telephone Encounter (Signed)
Patient states that she is feeling fatigue and would like to come in to have Hgb checked

## 2018-05-19 LAB — CBC WITH DIFFERENTIAL/PLATELET
Basophils Absolute: 0.1 10*3/uL (ref 0.0–0.2)
Basos: 1 %
EOS (ABSOLUTE): 0.2 10*3/uL (ref 0.0–0.4)
Eos: 4 %
HEMATOCRIT: 37.9 % (ref 34.0–46.6)
Hemoglobin: 12.5 g/dL (ref 11.1–15.9)
Immature Grans (Abs): 0 10*3/uL (ref 0.0–0.1)
Immature Granulocytes: 0 %
LYMPHS ABS: 1.2 10*3/uL (ref 0.7–3.1)
Lymphs: 24 %
MCH: 30.6 pg (ref 26.6–33.0)
MCHC: 33 g/dL (ref 31.5–35.7)
MCV: 93 fL (ref 79–97)
Monocytes Absolute: 0.5 10*3/uL (ref 0.1–0.9)
Monocytes: 10 %
Neutrophils Absolute: 3.2 10*3/uL (ref 1.4–7.0)
Neutrophils: 61 %
Platelets: 235 10*3/uL (ref 150–450)
RBC: 4.08 x10E6/uL (ref 3.77–5.28)
RDW: 11.6 % — ABNORMAL LOW (ref 11.7–15.4)
WBC: 5.2 10*3/uL (ref 3.4–10.8)

## 2018-05-22 ENCOUNTER — Encounter: Payer: Self-pay | Admitting: Physical Therapy

## 2018-05-22 ENCOUNTER — Ambulatory Visit: Payer: Medicare HMO | Admitting: Physical Therapy

## 2018-05-22 DIAGNOSIS — M542 Cervicalgia: Secondary | ICD-10-CM

## 2018-05-22 DIAGNOSIS — M62838 Other muscle spasm: Secondary | ICD-10-CM

## 2018-05-22 DIAGNOSIS — R279 Unspecified lack of coordination: Secondary | ICD-10-CM

## 2018-05-22 DIAGNOSIS — R293 Abnormal posture: Secondary | ICD-10-CM

## 2018-05-22 NOTE — Patient Instructions (Signed)
Access Code: [email or web address]  URL: [email or web address]  Date: 05/22/2018  Prepared by: Lovett Calender   Exercises  Seated Chin Tuck with Neck Elongation - 10 reps - 1 sets - 5 sec hold - 1x daily - 7x weekly  Seated Cervical Retraction and Rotation - 5 reps - 1 sets - 10 sec hold - 1x daily - 7x weekly  Standing Cervical Retraction with Sidebending - 5 reps - 1 sets - 10 sec hold - 1x daily - 7x weekly  Seated Thoracic Extension with Hands Behind Neck - 10 reps - 1 sets - 5 sec hold - 1x daily - 7x weekly  Jaw Abduction - 10 reps - 3 sets - 1x daily - 7x weekly  Supine Shoulder Horizontal Abduction with Resistance - 10 reps - 3 sets - 1x daily - 7x weekly

## 2018-05-22 NOTE — Therapy (Signed)
[facility] [address], [place], [place], [address] Phone: [phone]   Fax:  [phone]  Physical Therapy Treatment  Patient Details  Name: Kristy Garza MRN: [id number] Date of Birth: 04-09-1950 Referring Provider (PT): Dr. [name]. Gottschalk   Encounter Date: 05/22/2018  PT End of Session - 05/22/18 1005    Visit Number  5    Number of Visits  12    Date for PT Re-Evaluation  06/18/18    Authorization Type  [employer] medicare    PT Start Time  1005    PT Stop Time  1045    PT Time Calculation (min)  40 min    Activity Tolerance  Patient tolerated treatment well    Behavior During Therapy  WFL for tasks assessed/performed       Past Medical History:  Diagnosis Date  . Acid reflux   . Allergy   . Arthritis    ARTHRITIS IN NECK BY DR. Alroy Dust [facility]  . Asthma   . Interstitial cystitis   . Osteoporosis   . PONV (postoperative nausea and vomiting)   . Tinnitus   . Trigeminal neuralgia    Atypical trigeminal neuralgia    Past Surgical History:  Procedure Laterality Date  . ABDOMINAL HYSTERECTOMY  2000  . CYSTOSCOPY WITH HYDRODISTENSION AND BIOPSY N/A 06/11/2012   Procedure: CYSTOSCOPY/BIOPSY/HYDRODISTENSION with Instillation of Pyridium and Marcaine and Kenalog;  Surgeon: Ailene Rud, MD;  Location: [facility];  Service: Urology;  Laterality: N/A;  1 hour requested for this case  BLADDER BIOPSY  . ETHMOIDECTOMY  2012  . SEPTOPLASTY  1980's  . vocal cord surgery   1990's   polyp removal    There were no vitals filed for this visit.  Subjective Assessment - 05/22/18 1008    Subjective  Pt states she is still tired.  She felt more tightness in the jaw when she was busier.    Patient Stated Goals  have less pain in TMJ    Currently in Pain?  Yes    Pain Score  3     Pain Location  Jaw    Pain Orientation  Left    Pain Descriptors / Indicators  Aching    Pain Type  Chronic  pain    Pain Onset  More than a month ago    Pain Frequency  Constant                       [facility] Adult PT Treatment/Exercise - 05/22/18 0001      Neck Exercises: Theraband   Horizontal ABduction  20 reps;Red;Limitations    Horizontal ABduction Limitations  supine with cues to keep back of neck elongated      Neck Exercises: Supine   Neck Retraction  20 reps;5 secs   cues to tuck chin     Manual Therapy   Soft tissue mobilization  suboccipitals, SCM, pecs, cervical paraspinals    Myofascial Release  suboccipitals, cranial traction, masseter, temporalis       Trigger Point Dry Needling - 05/22/18 1058    Consent Given?  Yes    Muscles Treated Upper Body  Oblique capitus;Suboccipitals muscle group   cervical multifidi, SCM   Sternocleidomastoid Response  Twitch response elicited;Palpable increased muscle length    Oblique Capitus Response  Twitch response elicited;Palpable increased muscle length    SubOccipitals Response  Twitch response elicited;Palpable increased muscle length  PT Education - 05/22/18 1046    Education Details   Access Code: [email or web address]     Person(s) Educated  Patient    Methods  Explanation;Demonstration;Handout;Verbal cues;Tactile cues    Comprehension  Verbalized understanding;Returned demonstration       PT Short Term Goals - 05/22/18 1011      PT SHORT TERM GOAL #1   Title  be independent in initial HEP    Status  Achieved      PT SHORT TERM GOAL #2   Title  report a 30% reduction in frequency and intenisty of headaches    Baseline  reports 40% less intense    Status  Achieved      PT SHORT TERM GOAL #3   Title  report 30% less pain in TMJ with chewing    Baseline  40 % less    Status  Achieved      PT SHORT TERM GOAL #4   Title  reports 30% leas pain with yawning due to reduction of lower mandible deviation    Baseline  40% less pain    Status  Achieved        PT Long Term Goals - 04/23/18 1205      PT  LONG TERM GOAL #1   Title  be independent in advanced HEP    Time  6    Status  New    Target Date  06/18/18      PT LONG TERM GOAL #2   Title  reduction of intensity of headaches when she wakes up </= 1-2/10    Time  8    Period  Weeks    Status  New    Target Date  06/18/18      PT LONG TERM GOAL #3   Title  pain with chewing decreased >/= 1-2/10 due to reduction of muscle spasms    Time  8    Period  Weeks    Status  New    Target Date  06/18/18      PT LONG TERM GOAL #4   Title  talk with pain </= 1-2/10 due to less deviaiton of the lower mandible    Time  8    Period  Weeks    Status  New    Target Date  06/18/18      PT LONG TERM GOAL #5   Title  yawning with pain </= 1-2/10 due to reduction of muscle spasms    Time  8    Period  Weeks    Status  New    Target Date  06/18/18            Plan - 05/22/18 1047    Clinical Impression Statement  Pt is overall feeling 40% better and achieved short term goals for pain reduction.  Pt continues to have fascial adhesions and muscle trigger points in cervical and jaw musculature.  She responded well to manual techniques with dry needling.  There were no trigger points palpated in temporalis muscles today.  She was able to add band exercise to HEP.  Pt will continue to benefit from skilled PT for imporved strength and pain management.    PT Treatment/Interventions  Cryotherapy;Iontophoresis 4mg /ml Dexamethasone;Moist Heat;Ultrasound;Neuromuscular re-education;Therapeutic exercise;Therapeutic activities;Patient/family education;Manual techniques;Dry needling;Taping    PT Next Visit Plan  f/u on dry needling and horizontal abduction, posture strength, continue cervical ROM, scap squeezes, soft tissue work     PT Home Exercise Plan   Access Code:  [email or web address]     Consulted and Agree with Plan of Care  Patient       Patient will benefit from skilled therapeutic intervention in order to improve the following deficits and  impairments:  Decreased activity tolerance, Decreased range of motion, Postural dysfunction, Pain, Increased fascial restricitons, Decreased mobility, Increased muscle spasms, Decreased strength  Visit Diagnosis: Other muscle spasm  Abnormal posture  Unspecified lack of coordination  Cervicalgia     Problem List Patient Active Problem List   Diagnosis Date Noted  . Adult BMI <19 kg/sq m 04/13/2018  . Pharyngitis 01/09/2018  . Anxiety state 01/19/2016  . Adjustment disorder with anxious mood 01/19/2016  . HLD (hyperlipidemia) 01/07/2016  . IBS (irritable bowel syndrome) 07/11/2015  . Functional constipation 06/22/2015  . Chronic bronchitis ([place]) 05/07/2015  . 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 Trigeminal neuralgia 04/17/2014  . Face pain 01/22/2014  . 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 Asthma, chronic 05/13/2013  . Generalized anxiety disorder 05/13/2013  . Difficulty speaking 04/11/2013  . 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 Leg cramps 03/23/2013  . Acid reflux 03/11/2013  . Osteoporosis with pathological fracture 01/30/2013  . Abdominal pain 01/16/2013  . Back ache 01/16/2013  . Chronic interstitial cystitis 01/16/2013  . FOM (frequency of micturition) 01/16/2013  . Seasonal allergic rhinitis 07/07/2012  . G E R D 08/15/2007  . VOCAL CORD POLYP, HX OF 08/15/2007    Zannie Cove, PT 05/22/2018, 10:58 AM  [facility] [address] [place], [place], [address] Phone: [phone]   Fax:  [phone]  Name: Kristy Garza MRN: [id number] Date of Birth: 11-08-1950

## 2018-05-23 ENCOUNTER — Ambulatory Visit (INDEPENDENT_AMBULATORY_CARE_PROVIDER_SITE_OTHER): Payer: Medicare HMO | Admitting: Family Medicine

## 2018-05-23 ENCOUNTER — Encounter: Payer: Self-pay | Admitting: Neurology

## 2018-05-23 VITALS — BP 116/74 | HR 73 | Temp 97.6°F | Ht 69.0 in | Wt 107.0 lb

## 2018-05-23 DIAGNOSIS — G4763 Sleep related bruxism: Secondary | ICD-10-CM

## 2018-05-23 DIAGNOSIS — F411 Generalized anxiety disorder: Secondary | ICD-10-CM

## 2018-05-23 DIAGNOSIS — G500 Trigeminal neuralgia: Secondary | ICD-10-CM | POA: Diagnosis not present

## 2018-05-23 DIAGNOSIS — Z681 Body mass index (BMI) 19 or less, adult: Secondary | ICD-10-CM | POA: Diagnosis not present

## 2018-05-23 DIAGNOSIS — E440 Moderate protein-calorie malnutrition: Secondary | ICD-10-CM

## 2018-05-23 DIAGNOSIS — R69 Illness, unspecified: Secondary | ICD-10-CM | POA: Diagnosis not present

## 2018-05-23 DIAGNOSIS — R300 Dysuria: Secondary | ICD-10-CM | POA: Diagnosis not present

## 2018-05-23 LAB — MICROSCOPIC EXAMINATION
BACTERIA UA: NONE SEEN
RBC, UA: NONE SEEN /hpf (ref 0–2)
Renal Epithel, UA: NONE SEEN /hpf

## 2018-05-23 LAB — URINALYSIS, COMPLETE
Bilirubin, UA: NEGATIVE
Glucose, UA: NEGATIVE
Ketones, UA: NEGATIVE
LEUKOCYTES UA: NEGATIVE
Nitrite, UA: NEGATIVE
Protein, UA: NEGATIVE
RBC, UA: NEGATIVE
Specific Gravity, UA: <1.005 — ABNORMAL LOW (ref 1.005–1.030)
Urobilinogen, Ur: 0.2 mg/dL (ref 0.2–1.0)
pH, UA: 6.5 (ref 5.0–7.5)

## 2018-05-23 MED ORDER — TIZANIDINE HCL 2 MG PO CAPS: 2.0000 mg | ORAL_CAPSULE | Freq: Three times a day (TID) | ORAL | 2 refills | Status: DC | PRN

## 2018-05-23 NOTE — Progress Notes (Signed)
Subjective: CC: f/u anxiety PCP: [name], DO KCL:EXNTZG S Gloster is a 68 y.o. female presenting to clinic today for:  1.  Anxiety symptoms/malnourishment Intolerant to Lexapro and Cymbalta for various reasons.  She notes that Cymbalta caused her to "feel bad" and dilated her eyes and therefore she discontinued the medicine.  She continues to have quite a bit of anxiety surrounding her health.  She exhibits restrictive behaviors with regards to eating.  She sees multiple providers for various health related concerns.  She has not taken any of the Ativan yet to see if this would help offset some of the anxiety.  She is visibly losing weight.  She brings me a list of foods that she was noted to have sensitivities to by her integrative specialist.  She has follow-up with gastroenterology soon for colonoscopy and endoscopy.  2.  Trigeminal neuralgia. Patient reports chronic history of trigeminal neuralgia.  She also has a history of TMJ.  She reports associated headaches and pain.  She takes as needed tramadol for this.  She also has a muscle relaxer.  She was previously seen by Dr. Tomi Likens and she would like to see him again.  3.  Dysuria Patient reports vaginal and urethral irritation with urination over the last couple of days.  She notes that she defecated on herself a couple of weeks ago and wonders if she perhaps has a urinary tract infection as a result.  She has known interstitial cystitis.  No fevers, vomiting.  ROS: Per HPI  Allergies  Allergen Reactions  . Cefuroxime Axetil Other (See Comments)    Extreme gas  . Gabapentin Other (See Comments)    Constipation Constipation  . Pregabalin Other (See Comments)    Drowsiness, dry mouth Drowsiness, dry mouth Makes her tired and thirsty  . Augmentin [Amoxicillin-Pot Clavulanate] Diarrhea  . Avelox [Moxifloxacin Hcl In Nacl] Other (See Comments)    Tremors   . Biaxin [Clarithromycin]     Unsure of reaction  . Cedax  [Ceftibuten] Other (See Comments)    tremors  . Ciprofloxacin Other (See Comments) and Nausea And Vomiting    Blurred vision Blurred vision Pt can't remember reaction  . Clindamycin/Lincomycin     tachycardia  . Levofloxacin Other (See Comments)    Feels dehydrated  . Lincomycin Other (See Comments)    tachycardia  . Montelukast Sodium Other (See Comments)    Numbness and tingling in hand. Numbness and tingling in hand.  . Sulfonamide Derivatives Other (See Comments)    Gi upset   Past Medical History:  Diagnosis Date  . Acid reflux   . Allergy   . Arthritis    ARTHRITIS IN NECK BY DR. Alroy Dust [facility]  . Asthma   . Interstitial cystitis   . Osteoporosis   . PONV (postoperative nausea and vomiting)   . Tinnitus   . Trigeminal neuralgia    Atypical trigeminal neuralgia    Current Outpatient Medications:  .  acetaminophen (TYLENOL) 500 MG tablet, Take by mouth., Disp: , Rfl:  .  albuterol (PROVENTIL HFA;VENTOLIN HFA) 108 (90 Base) MCG/ACT inhaler, INHALE TWO PUFFS EVERY FOUR TO SIX HOURS AS NEEDED FOR COUGH OR WHEEZE., Disp: 18 g, Rfl: 1 .  amitriptyline (ELAVIL) 10 MG tablet, TAKE 1 TABLET BY MOUTH EVERY DAY AT NIGHT, Disp: , Rfl: 11 .  ARNUITY ELLIPTA 200 MCG/ACT AEPB, TAKE 1 PUFF BY MOUTH EVERY DAY, Disp: 30 each, Rfl: 2 .  azelastine (ASTELIN) 0.1 % nasal spray, PLACE  2 SPRAYS INTO BOTH NOSTRILS 2 (TWO) TIMES DAILY. USE IN EACH NOSTRIL AS DIRECTED, Disp: 30 mL, Rfl: 2 .  b complex vitamins capsule, Take 1 capsule by mouth daily., Disp: , Rfl:  .  bisacodyl (DULCOLAX) 5 MG EC tablet, Take 5 mg by mouth daily as needed for moderate constipation., Disp: , Rfl:  .  calcium citrate-vitamin D (CITRACAL+D) 315-200 MG-UNIT tablet, Take 2 tablets by mouth 2 (two) times daily., Disp: , Rfl:  .  dextromethorphan-guaiFENesin (MUCINEX DM) 30-600 MG 12hr tablet, Take 1 tablet by mouth 2 (two) times daily., Disp: , Rfl:  .  DULoxetine (CYMBALTA) 20 MG capsule, Take 1 capsule (20 mg total)  by mouth daily. (Patient not taking: Reported on 05/23/2018), Disp: 30 capsule, Rfl: 1 .  Ergocalciferol (VITAMIN D2) 2000 units TABS, Take 2,000 Units by mouth daily., Disp: , Rfl:  .  lidocaine (XYLOCAINE) 2 % jelly, Apply topically as needed., Disp: 30 mL, Rfl: 10 .  lidocaine (XYLOCAINE) 5 % ointment, Apply daily as needed, Disp: , Rfl:  .  LORazepam (ATIVAN) 0.5 MG tablet, Take 1 tablet (0.5 mg total) by mouth 2 (two) times daily as needed for anxiety. (Patient not taking: Reported on 05/23/2018), Disp: 30 tablet, Rfl: 1 .  Methylcellulose, Laxative, (CITRUCEL PO), Take by mouth., Disp: , Rfl:  .  mometasone (ELOCON) 0.1 % ointment, 1 application to skin lesions twice daily until resolved., Disp: 45 g, Rfl: 0 .  mometasone (NASONEX) 50 MCG/ACT nasal spray, PLACE 2 SPRAYS INTO THE NOSE DAILY., Disp: 17 g, Rfl: 3 .  ondansetron (ZOFRAN) 4 MG tablet, Take 1 tablet (4 mg total) by mouth every 8 (eight) hours as needed for nausea or vomiting., Disp: 20 tablet, Rfl: 0 .  pantoprazole (PROTONIX) 40 MG tablet, Take 1 tablet (40 mg total) by mouth daily., Disp: 30 tablet, Rfl: 5 .  phenylephrine (SUDAFED PE) 10 MG TABS tablet, Take 10 mg by mouth every 4 (four) hours as needed., Disp: , Rfl:  .  Polyethylene Glycol 3350 (MIRALAX PO), Take by mouth as needed., Disp: , Rfl:  .  Probiotic Product (PROBIOTIC PO), Take by mouth daily., Disp: , Rfl:  .  Simethicone (GAS-X PO), Take by mouth., Disp: , Rfl:  .  tizanidine (ZANAFLEX) 2 MG capsule, Take 1-2 capsules (2-4 mg total) by mouth 3 (three) times daily as needed for muscle spasms., Disp: 60 capsule, Rfl: 2 .  traMADol (ULTRAM) 50 MG tablet, Take 1 tablet (50 mg total) by mouth every 12 (twelve) hours as needed., Disp: 30 tablet, Rfl: 1 .  UNABLE TO FIND, Take 1 capsule by mouth daily. Dv3- vitamin D 3 plus immune support, Disp: , Rfl:  Social History   Socioeconomic History  . Marital status: Married    Spouse name: Not on file  . Number of children:  3  . Years of education: 81  . Highest education level: Some college, no degree  Occupational History  . Occupation: CNA    Comment: Retired  Scientific laboratory technician  . Financial resource strain: Not on file  . Food insecurity:    Worry: Never true    Inability: Never true  . Transportation needs:    Medical: No    Non-medical: No  Tobacco Use  . Smoking status: Never Smoker  . Smokeless tobacco: Never Used  Substance and Sexual Activity  . Alcohol use: No    Alcohol/week: 0.0 standard drinks    Comment: 01-19-2016 per pt no  . Drug use:  No    Comment: 01-19-2016 per pt no   . Sexual activity: Never  Lifestyle  . Physical activity:    Days per week: 0 days    Minutes per session: 0 min  . Stress: Not at all  Relationships  . Social connections:    Talks on phone: More than three times a week    Gets together: More than three times a week    Attends religious service: Never    Active member of club or organization: No    Attends meetings of clubs or organizations: Never    Relationship status: Married  . Intimate partner violence:    Fear of current or ex partner: No    Emotionally abused: No    Physically abused: No    Forced sexual activity: No  Other Topics Concern  . Not on file  Social History Narrative   Lives with husband.    Family History  Problem Relation Age of Onset  . Hyperlipidemia Mother   . Arthritis Mother   . Cancer Mother   . Lymphoma Mother   . Cancer Father   . Allergies Father   . Allergies Sister   . Allergies Sister   . Allergic rhinitis Neg Hx   . Angioedema Neg Hx   . Asthma Neg Hx   . Eczema Neg Hx   . Immunodeficiency Neg Hx   . Urticaria Neg Hx     Objective: Office vital signs reviewed. BP 116/74   Pulse 73   Temp 97.6 F (36.4 C) (Oral)   Ht 5\' 9"  (1.753 m)   Wt 107 lb (48.5 kg)   BMI 15.80 kg/m   Physical Examination:  General: Awake, alert, malnourished, nontoxic appearing. No acute distress HEENT: sclera white,  MMM Cardio: RRR Pulm: normal work of breathing on room air Psych: Somewhat anxious.  Mood stable.  Affect appropriate.  Pleasant and interactive.  Does not appear to be responding to internal stimuli. Depression screen [facility] 2/9 05/23/2018 04/13/2018 04/11/2018  Decreased Interest 0 1 0  Down, Depressed, Hopeless 0 1 0  PHQ - 2 Score 0 2 0  Altered sleeping 0 2 0  Tired, decreased energy 0 2 0  Change in appetite 0 1 0  Feeling bad or failure about yourself  0 0 0  Trouble concentrating 0 0 0  Moving slowly or fidgety/restless 0 0 0  Suicidal thoughts 0 0 0  PHQ-9 Score 0 7 0  Some recent data might be hidden   GAD 7 : Generalized Anxiety Score 05/23/2018 04/11/2018  Nervous, Anxious, on Edge 3 1  Control/stop worrying 2 0  Worry too much - different things 2 0  Trouble relaxing 2 0  Restless 1 0  Easily annoyed or irritable 1 0  Afraid - awful might happen 1 0  Total GAD 7 Score 12 1    Assessment/ Plan: 68 y.o. female   1. Generalized anxiety disorder She continues to be extremely anxious, particularly surrounding her health.  She is visibly deteriorating with continued weight loss.  Extremely concerned for her wellbeing.  She seems to be intolerant to multiple medications and we have not been able to find something that works well for her.  She has not started the Ativan and I have encouraged her to at minimum start this at a quarter to half a tablet to see if she would tolerate the medicine.  I have also asked that she see Nicki Reaper, our counselor for therapy.  She seems amenable to this and I have placed a referral. - Referral to Chronic Care Management Services  2. Malnutrition of moderate degree (HCC) We had a discussion today about nutrition.  I encouraged her to increase protein and calories.  She seems to be very restrictive which is concerning to me.  Hopefully therapy as above will help with this. - Referral to Chronic Care Management Services  3. Adult BMI <19 kg/sq m As  above - Referral to Chronic Care Management Services  4. Trigeminal neuralgia Chronic issue.  Request recheck by neurologist.  Previously seen by [facility] in [facility] - Ambulatory referral to Neurology  5. Dysuria Likely due to IC.  Urinalysis ordered per her request. - Urinalysis, Complete  6. Bruxism, sleep-related - tizanidine (ZANAFLEX) 2 MG capsule; Take 1-2 capsules (2-4 mg total) by mouth 3 (three) times daily as needed for muscle spasms.  Dispense: 60 capsule; Refill: 2   Orders Placed This Encounter  Procedures  . Urinalysis, Complete  . Referral to Chronic Care Management Services    Referral Priority:   Routine    Referral Type:   Consultation    Referral Reason:   Care Coordination    Number of Visits Requested:   1  . Ambulatory referral to Neurology    Referral Priority:   Routine    Referral Type:   Consultation    Referral Reason:   Specialty Services Required    Requested Specialty:   Neurology    Number of Visits Requested:   1   Meds ordered this encounter  Medications  . tizanidine (ZANAFLEX) 2 MG capsule    Sig: Take 1-2 capsules (2-4 mg total) by mouth 3 (three) times daily as needed for muscle spasms.    Dispense:  60 capsule    Refill:  [place], [place] [phone]

## 2018-05-23 NOTE — Patient Instructions (Signed)
I have asked Nicki Reaper, our counselor, to reach out to you.  He is not in office today but should reach out sometime this week.    I have placed a referral back to Dr Georgie Chard office.  I will call you with your urine results.  As we discussed you are considered malnourished.  I am concerned about your weight loss.  Keep your GI appointment.     Protein-Energy Malnutrition Protein-energy malnutrition is when a person does not eat enough protein, fat, and calories. When this happens over time, it can lead to severe loss of muscle tissue (muscle wasting). This condition also affects the body's defense system (immune system) and can lead to other health problems. What are the causes? This condition may be caused by:  Not eating enough protein, fat, or calories.  Having certain chronic medical conditions.  Eating too little. What increases the risk? The following factors may make you more likely to develop this condition:  Living in poverty.  Long-term hospitalization.  Alcohol or drug dependency. Addiction often leads to a lifestyle in which proper diet is ignored. Dependency can also hurt the metabolism and the body's ability to absorb nutrients.  Eating disorders, such as anorexia nervosa or bulimia.  Chewing or swallowing problems. People with these disorders may not eat enough.  Having certain conditions, such as: ? Inflammatory bowel disease. Inflammation of the intestines makes it difficult for the body to absorb nutrients. ? Cancer or AIDS. These diseases can cause a loss of appetite. ? Chronic heart failure. This interferes with how the body uses nutrients. ? Cystic fibrosis. This disease can make it difficult for the body to absorb nutrients.  Eating a diet that extremely restricts protein, fat, or calorie intake. What are the signs or symptoms? Symptoms of this condition include:  Fatigue.  Weakness.  Dizziness.  Fainting.  Weight loss.  Loss of muscle tone and  muscle mass.  Poor immune response.  Lack of menstruation.  Poor memory.  Hair loss.  Skin changes. How is this diagnosed? This condition may be diagnosed based on:  Your medical and dietary history.  A physical exam. This may include a measurement of your body mass index (BMI).  Blood tests. How is this treated? This condition may be managed with:  Nutrition therapy. This may include working with a diet and nutrition specialist (dietitian).  Treatment for underlying conditions. People with severe protein-energy malnutrition may need to be treated in a hospital. This may involve receiving nutrition and fluids through an IV. Follow these instructions at home:   Eat a balanced diet. In each meal, include at least one food that is high in protein. Foods that are high in protein include: ? Meat. ? Poultry. ? Fish. ? Eggs. ? Cheese. ? Milk. ? Beans. ? Nuts.  Eat nutrient-rich foods that are easy to swallow and digest, such as: ? Fruit and yogurt smoothies. ? Oatmeal with nut butter.  Try to eat six small meals each day instead of three large meals.  Take vitamin and protein supplements as told by your health care provider or dietitian.  Follow your health care provider's recommendations about exercise and activity.  Keep all follow-up visits as told by your health care provider. This is important. Contact a health care provider if you:  Have increased weakness or fatigue.  Faint.  Are a woman and you stop having your period (menstruating).  Have rapid hair loss.  Have unexpected weight loss.  Have diarrhea.  Have nausea  and vomiting. Get help right away if you have:  Difficulty breathing.  Chest pain. Summary  Protein-energy malnutrition is when a person does not eat enough protein, fat, and calories.  Protein-energy malnutrition can lead to severe loss of muscle tissue (muscle wasting). This condition also affects the body's defense system (immune  system) and can lead to other health problems.  Talk with your health care provider about treatment for this condition. Effective treatment depends on the underlying cause of the malnutrition. This information is not intended to replace advice given to you by your health care provider. Make sure you discuss any questions you have with your health care provider. Document Released: 01/28/2005 Document Revised: 03/29/2017 Document Reviewed: 03/29/2017 Elsevier Interactive Patient Education  2019 [employer].

## 2018-05-24 ENCOUNTER — Ambulatory Visit: Payer: Medicare HMO | Admitting: Physical Therapy

## 2018-05-24 ENCOUNTER — Ambulatory Visit: Payer: Self-pay | Admitting: Neurology

## 2018-05-25 ENCOUNTER — Ambulatory Visit (INDEPENDENT_AMBULATORY_CARE_PROVIDER_SITE_OTHER): Payer: Medicare HMO | Admitting: Licensed Clinical Social Worker

## 2018-05-25 DIAGNOSIS — E440 Moderate protein-calorie malnutrition: Secondary | ICD-10-CM

## 2018-05-25 DIAGNOSIS — K219 Gastro-esophageal reflux disease without esophagitis: Secondary | ICD-10-CM

## 2018-05-25 DIAGNOSIS — F411 Generalized anxiety disorder: Secondary | ICD-10-CM

## 2018-05-25 DIAGNOSIS — D649 Anemia, unspecified: Secondary | ICD-10-CM | POA: Diagnosis not present

## 2018-05-25 NOTE — Patient Instructions (Signed)
Licensed Clinical Social Worker Visit Information  Materials Provided:  No  Ms. Chaplin was given information about Chronic Care Management services today including:  1. CCM service includes personalized support from designated clinical staff supervised by her physician, including individualized plan of care and coordination with other care providers 2. 24/7 contact phone numbers for assistance for urgent and routine care needs. 3. Service will only be billed when office clinical staff spend 20 minutes or more in a month to coordinate care. 4. Only one practitioner may furnish and bill the service in a calendar month. 5. The patient may stop CCM services at any time (effective at the end of the month) by phone call to the office staff. 6. The patient will be responsible for cost sharing (co-pay) of up to 20% of the service fee (after annual deductible is met).  Patient agreed to services and verbal consent obtained.    Goals Discussed Today:   Current Barriers:   Pain issues  Limited access to food  Mental Health Concerns    Patient Stated Goal:    "I would like to talk with someone about my health issues and how I am dealing with them"   Clinical Social Work Clinical Goal(s):   Over the next 30 days, client will work with SW to address concerns related to health issues of client  and how she is dealing with health issues  Interventions:  Patient interviewed and appropriate assessments performed  Provided patient with information about CCM program services  LCSW encouraged client to talk with RN CM Chong Sicilian about nursing needs of client  Provided counseling support for client  Patient Self Care Activities:   Attends all scheduled provider appointments  Self administers medications as prescribed  Calls provider office for new concerns or questions  Plan:   Patient will contact LCSW as needed to discuss psychosocial needs of client  LCSW will call client in  two weeks to discuss health needs of client and client management of health needs.  Client to attend scheduled medical appointments  Client to contact PCP office as needed to discuss medical questions of client  Client to communicate with RN CM to discuss nursing needs of client  *initial goal documentation   Follow Up Plan: LCSW to call client in two weeks to discuss health needs of client and client management of health needs   The patient verbalized understanding of instructions provided today and declined a print copy of patient instruction materials.   [name].Jleigh Striplin MSW, LCSW Licensed Clinical Social Worker [facility] [phone]

## 2018-05-25 NOTE — Chronic Care Management (AMB) (Signed)
Chronic Care Management    Clinical Social Work General Follow Up Note  05/25/2018 Name: Kristy Garza MRN: [id number] DOB: May 01, 1950  Referred by: PCP, Kristy Garza for psychosocial assessment and possible counseling suport  Review of patient status, including review of consultants reports, relevant laboratory and other test results, and collaboration with appropriate care team members and the patient's provider was performed as part of comprehensive patient evaluation and provision of chronic care management services.    Last CCM Appointment: 05/25/2018  Kristy Garza was given information about Chronic Care Management services today including:  1. CCM service includes personalized support from designated clinical staff supervised by her physician, including individualized plan of care and coordination with other care providers 2. 24/7 contact phone numbers for assistance for urgent and routine care needs. 3. Service will only be billed when office clinical staff spend 20 minutes or more in a month to coordinate care. 4. Only one practitioner may furnish and bill the service in a calendar month. 5. The patient may stop CCM services at any time (effective at the end of the month) by phone call to the office staff. 6. The patient will be responsible for cost sharing (co-pay) of up to 20% of the service fee (after annual deductible is met).  Patient agreed to services and verbal consent obtained.   Depression screen [facility] 2/9 05/23/2018  Decreased Interest 0  Down, Depressed, Hopeless 0  PHQ - 2 Score 0  Altered sleeping 0  Tired, decreased energy 0  Change in appetite 0  Feeling bad or failure about yourself  0  Trouble concentrating 0  Moving slowly or fidgety/restless 0  Suicidal thoughts 0  PHQ-9 Score 0  Some recent data might be hidden     GAD 7 : Generalized Anxiety Score 05/23/2018 04/11/2018  Nervous, Anxious, on Edge 3 1  Control/stop worrying 2 0  Worry too much  - different things 2 0  Trouble relaxing 2 0  Restless 1 0  Easily annoyed or irritable 1 0  Afraid - awful might happen 1 0  Total GAD 7 Score 12 1    Current Barriers:   Pain issues . Limited access to food . Mental Health Concerns    Patient Stated Goal:    "I would like to talk with someone about my health issues and how I am dealing with them"  Clinical Social Work Clinical Goal(s):  [name] Over the next 30 days, client will work with SW to address concerns related to health issues of client  and how she is dealing with health issues  Interventions: . Patient interviewed and appropriate assessments performed . Provided patient with information about CCM program services . LCSW encouraged client to talk with RN CM Kristy Garza about nursing needs of client . Provided counseling support for client  Patient Self Care Activities:  . Attends all scheduled provider appointments . Self administers medications as prescribed . Calls provider office for new concerns or questions  Plan:  . Patient will contact LCSW as needed to discuss psychosocial needs of client . LCSW will call client in two weeks to discuss health needs of client and client management of health needs. . Client to attend scheduled medical appointments . Client to contact PCP office as needed to discuss medical questions of client . Client to communicate with RN CM to discuss nursing needs of client  *initial goal documentation   Follow Up Plan: LCSW to call client in two weeks to discuss  health needs of client and client management of health needs   [name].Bj Morlock MSW, LCSW Licensed Clinical Social Worker [facility] [phone]

## 2018-05-29 ENCOUNTER — Encounter: Payer: Self-pay | Admitting: Physical Therapy

## 2018-05-30 ENCOUNTER — Other Ambulatory Visit: Payer: Medicare HMO

## 2018-05-30 ENCOUNTER — Encounter: Payer: Self-pay | Admitting: Physical Therapy

## 2018-05-30 ENCOUNTER — Ambulatory Visit: Payer: Medicare HMO | Attending: Family Medicine | Admitting: Physical Therapy

## 2018-05-30 DIAGNOSIS — N959 Unspecified menopausal and perimenopausal disorder: Secondary | ICD-10-CM | POA: Diagnosis not present

## 2018-05-30 DIAGNOSIS — R279 Unspecified lack of coordination: Secondary | ICD-10-CM | POA: Insufficient documentation

## 2018-05-30 DIAGNOSIS — M542 Cervicalgia: Secondary | ICD-10-CM | POA: Insufficient documentation

## 2018-05-30 DIAGNOSIS — R5381 Other malaise: Secondary | ICD-10-CM | POA: Diagnosis not present

## 2018-05-30 DIAGNOSIS — R293 Abnormal posture: Secondary | ICD-10-CM | POA: Diagnosis not present

## 2018-05-30 DIAGNOSIS — D509 Iron deficiency anemia, unspecified: Secondary | ICD-10-CM | POA: Diagnosis not present

## 2018-05-30 DIAGNOSIS — M810 Age-related osteoporosis without current pathological fracture: Secondary | ICD-10-CM | POA: Diagnosis not present

## 2018-05-30 DIAGNOSIS — M62838 Other muscle spasm: Secondary | ICD-10-CM | POA: Insufficient documentation

## 2018-05-30 NOTE — Therapy (Addendum)
[facility] [address], [place], [place], [address] Phone: [phone]   Fax:  [phone]  Physical Therapy Treatment  Patient Details  Name: Kristy Garza MRN: [id number] Date of Birth: March 28, 1951 Referring Provider (PT): Dr. [name]. Gottschalk   Encounter Date: 05/30/2018  PT End of Session - 05/30/18 1151    Visit Number  6    Number of Visits  12    Date for PT Re-Evaluation  06/18/18    Authorization Type  [employer] medicare    PT Start Time  1147    PT Stop Time  1227    PT Time Calculation (min)  40 min    Activity Tolerance  Patient tolerated treatment well    Behavior During Therapy  WFL for tasks assessed/performed       Past Medical History:  Diagnosis Date  . Acid reflux   . Allergy   . Arthritis    ARTHRITIS IN NECK BY DR. Alroy Dust [facility]  . Asthma   . Interstitial cystitis   . Osteoporosis   . PONV (postoperative nausea and vomiting)   . Tinnitus   . Trigeminal neuralgia    Atypical trigeminal neuralgia    Past Surgical History:  Procedure Laterality Date  . ABDOMINAL HYSTERECTOMY  2000  . CYSTOSCOPY WITH HYDRODISTENSION AND BIOPSY N/A 06/11/2012   Procedure: CYSTOSCOPY/BIOPSY/HYDRODISTENSION with Instillation of Pyridium and Marcaine and Kenalog;  Surgeon: Ailene Rud, MD;  Location: [facility];  Service: Urology;  Laterality: N/A;  1 hour requested for this case  BLADDER BIOPSY  . ETHMOIDECTOMY  2012  . SEPTOPLASTY  1980's  . vocal cord surgery   1990's   polyp removal    There were no vitals filed for this visit.  Subjective Assessment - 05/30/18 1150    Subjective  Pt states her teeth are hurting, but probably from clenching at night.  States her head hurt when she woke up . She reports not having pain with talking, yawning or eating    Pertinent History  OP; TMJ; COPD.    Patient Stated Goals  have less pain in TMJ    Currently in Pain?  Yes    Pain  Score  4     Pain Location  Jaw    Pain Orientation  Left    Pain Descriptors / Indicators  Aching    Pain Type  Chronic pain    Pain Onset  More than a month ago    Pain Frequency  Constant    Multiple Pain Sites  No                       [facility] Adult PT Treatment/Exercise - 05/30/18 0001      Neck Exercises: Theraband   Rows  20 reps;Green   cues for shoulder position     Neck Exercises: Seated   Neck Retraction  10 reps;3 secs    Other Seated Exercise  pulleys with cues for posture awareness; thoracic ext ball behind back    Other Seated Exercise  shouler ER yellow band - 20x      Manual Therapy   Soft tissue mobilization  upper traps, cervical multifidi, suboccipitals       Trigger Point Dry Needling - 05/30/18 0001    Consent Given?  Yes    Muscles Treated Head and Neck  Upper trapezius;Suboccipitals;Cervical multifidi    Upper Trapezius Response  Twitch reponse elicited;Palpable increased  muscle length    SubOccipitals Response  Twitch response elicited;Palpable increased muscle length    Cervical multifidi Response  Twitch reponse elicited;Palpable increased muscle length           PT Education - 05/30/18 1231    Education Details   Access Code: [email or web address]     Person(s) Educated  Patient    Methods  Explanation;Demonstration;Handout;Verbal cues;Tactile cues    Comprehension  Verbalized understanding;Returned demonstration       PT Short Term Goals - 05/22/18 1011      PT SHORT TERM GOAL #1   Title  be independent in initial HEP    Status  Achieved      PT SHORT TERM GOAL #2   Title  report a 30% reduction in frequency and intenisty of headaches    Baseline  reports 40% less intense    Status  Achieved      PT SHORT TERM GOAL #3   Title  report 30% less pain in TMJ with chewing    Baseline  40 % less    Status  Achieved      PT SHORT TERM GOAL #4   Title  reports 30% leas pain with yawning due to reduction of lower mandible deviation     Baseline  40% less pain    Status  Achieved        PT Long Term Goals - 05/30/18 1156      PT LONG TERM GOAL #1   Title  be independent in advanced HEP    Status  On-going      PT LONG TERM GOAL #2   Title  reduction of intensity of headaches when she wakes up </= 1-2/10    Baseline  6/10    Status  On-going      PT LONG TERM GOAL #3   Title  pain with chewing decreased >/= 1-2/10 due to reduction of muscle spasms    Baseline  no pain    Status  Achieved      PT LONG TERM GOAL #4   Title  talk with pain </= 1-2/10 due to less deviaiton of the lower mandible    Baseline  no pain    Status  Achieved      PT LONG TERM GOAL #5   Title  yawning with pain </= 1-2/10 due to reduction of muscle spasms    Baseline  no pain    Status  Achieved            Plan - 05/30/18 1200    Clinical Impression Statement  Pt has had no jaw pain since last visit.  She is feeling overall much better but still having severe HA in the mornings.  Pt demonstrates increased kyphosis of thoracic spine.  PT focused more on posture and muscle spasms in cervcal region.  PT is recommended to continue address impairments of posture.    PT Treatment/Interventions  Cryotherapy;Iontophoresis '4mg'$ /ml Dexamethasone;Moist Heat;Ultrasound;Neuromuscular re-education;Therapeutic exercise;Therapeutic activities;Patient/family education;Manual techniques;Dry needling;Taping    PT Next Visit Plan  f/u on dry needling and horizontal abduction, posture strength, continue cervical ROM, scap squeezes, soft tissue work     PT Home Exercise Plan   Access Code: [email or web address]     Consulted and Agree with Plan of Care  Patient       Patient will benefit from skilled therapeutic intervention in order to improve the following deficits and impairments:  Decreased activity tolerance, Decreased range of motion, Postural dysfunction, Pain,  Increased fascial restricitons, Decreased mobility, Increased muscle spasms, Decreased  strength  Visit Diagnosis: Other muscle spasm  Abnormal posture  Unspecified lack of coordination  Cervicalgia     Problem List Patient Active Problem List   Diagnosis Date Noted  . Malnutrition of moderate degree ([place]) 05/23/2018  . Adult BMI <19 kg/sq m 04/13/2018  . Pharyngitis 01/09/2018  . Anxiety state 01/19/2016  . Adjustment disorder with anxious mood 01/19/2016  . HLD (hyperlipidemia) 01/07/2016  . IBS (irritable bowel syndrome) 07/11/2015  . Functional constipation 06/22/2015  . Chronic bronchitis ([place]) 05/07/2015  . 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 Trigeminal neuralgia 04/17/2014  . Face pain 01/22/2014  . 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 Asthma, chronic 05/13/2013  . Generalized anxiety disorder 05/13/2013  . Difficulty speaking 04/11/2013  . 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 Leg cramps 03/23/2013  . Acid reflux 03/11/2013  . Osteoporosis with pathological fracture 01/30/2013  . Abdominal pain 01/16/2013  . Back ache 01/16/2013  . Chronic interstitial cystitis 01/16/2013  . FOM (frequency of micturition) 01/16/2013  . Seasonal allergic rhinitis 07/07/2012  . G E R D 08/15/2007  . VOCAL CORD POLYP, HX OF 08/15/2007    Jule Ser, PT 05/30/2018, 1:56 PM  [facility] [address], [place], [place], [address] Phone: [phone]   Fax:  [phone]  Name: Kristy Garza MRN: [id number] Date of Birth: January 11, 1951  PHYSICAL THERAPY DISCHARGE SUMMARY  Visits from Start of Care: 6  Current functional level related to goals / functional outcomes: See above   Remaining deficits: See above   Education / Equipment: HEP  Plan: Patient agrees to discharge.  Patient goals were partially met. Patient is being discharged due to not returning since the last visit.  ?????    [employer], PT 08/27/18 2:26 PM

## 2018-05-30 NOTE — Patient Instructions (Signed)
Access Code: [email or web address]  URL: [email or web address]  Date: 05/30/2018  Prepared by: Jari Favre   Exercises  Seated Chin Tuck with Neck Elongation - 10 reps - 1 sets - 5 sec hold - 1x daily - 7x weekly  Seated Cervical Retraction and Rotation - 5 reps - 1 sets - 10 sec hold - 1x daily - 7x weekly  Standing Cervical Retraction with Sidebending - 5 reps - 1 sets - 10 sec hold - 1x daily - 7x weekly  Seated Thoracic Extension with Hands Behind Neck - 10 reps - 1 sets - 5 sec hold - 1x daily - 7x weekly  Jaw Abduction - 10 reps - 3 sets - 1x daily - 7x weekly  Supine Shoulder Horizontal Abduction with Resistance - 10 reps - 3 sets - 1x daily - 7x weekly  Standing Row with Anchored Resistance - 10 reps - 3 sets - 1x daily - 7x weekly

## 2018-05-31 ENCOUNTER — Encounter: Payer: Self-pay | Admitting: Physical Therapy

## 2018-06-07 ENCOUNTER — Other Ambulatory Visit: Payer: Self-pay | Admitting: *Deleted

## 2018-06-07 ENCOUNTER — Encounter: Payer: Self-pay | Admitting: Physical Therapy

## 2018-06-07 ENCOUNTER — Ambulatory Visit: Payer: Self-pay | Admitting: Licensed Clinical Social Worker

## 2018-06-07 DIAGNOSIS — R5383 Other fatigue: Secondary | ICD-10-CM

## 2018-06-07 DIAGNOSIS — R519 Headache, unspecified: Secondary | ICD-10-CM

## 2018-06-07 DIAGNOSIS — K219 Gastro-esophageal reflux disease without esophagitis: Secondary | ICD-10-CM

## 2018-06-07 DIAGNOSIS — R51 Headache: Secondary | ICD-10-CM

## 2018-06-07 DIAGNOSIS — F411 Generalized anxiety disorder: Secondary | ICD-10-CM

## 2018-06-07 MED ORDER — ALBUTEROL SULFATE HFA 108 (90 BASE) MCG/ACT IN AERS: 2.0000 | INHALATION_SPRAY | RESPIRATORY_TRACT | 1 refills | Status: DC | PRN

## 2018-06-07 NOTE — Chronic Care Management (AMB) (Signed)
  Care Management Note   Kristy Garza is a 68 y.o. year old female who is a primary care patient of [name], DO. The CM team was consult for assistance with chronic disease management and care coordination.   I reached out to [name] by phone today.   Review of patient status, including review of consultants reports, relevant laboratory and other test results, and collaboration with appropriate care team members and the patient's provider was performed as part of comprehensive patient evaluation and provision of chronic care management services.  Goals Addressed            This Visit's Progress   . Client said she wants to talk with someone about her health issues and how she deals with them (pt-stated)       Current Barriers:  . Limited access to food . Mental Health Concerns   . Pain issues . Family caregiving challenges  Clinical Social Work Clinical Goal(s):  [name] Over the next 30 day days, client will work with SW to address concerns related to health issues of client and how she deals with health issue  Interventions: . Patient interviewed and appropriate assessments performed . Provided patient with information about CCM services available  . Encouraged client to talk with RN CM about nursing needs of client . Provided counseling support for client . Talked with client about family caregiving challenges  Patient Self Care Activities:  . Attends all scheduled provider appointments  . Self administers prescribed medications . Calls provider office for new concerns or questions  Plan: Patient to contact LCSW to discuss psychosocial needs of cllient LCSW will call client in 3 weeks to discuss health needs of client  and client management of health needs Client to attend scheduled medical appointments Client to communicate with RN CM to discuss nursing needs of client Client to use relaxation techniques of choice to help her manage stress/anxiety   *Initial goal documentation     Follow Up Plan: LCSW to call client in 3 weeks to talk with client about her health concerns and managing health concerns  [name].Jaicob Dia MSW, LCSW Licensed Clinical Social Worker [facility] [phone]

## 2018-06-07 NOTE — Patient Instructions (Signed)
Licensed Clinical Social Worker Visit Information  Goals we discussed today:  Goals Addressed            This Visit's Progress   . Client said she wants to talk with someone about her health issues and how she deals with them (pt-stated)       Current Barriers:  . Limited access to food . Mental Health Concerns   . Pain issues  Clinical Social Work Clinical Goal(s):  [name] Over the next 30 day days, client will work with SW to address concerns related to health issues of client and how she deals with health issue  Interventions: . Patient interviewed and appropriate assessments performed . Provided patient with information about CCM services available  . Encouraged client to talk with RN CM about nursing needs of client . Provided counseling support for client  Patient Self Care Activities:  . Attends all scheduled provider appointments  . Self administers prescribed medications . Calls provider office for new concerns or questions  Plan: Patient to contact LCSW to discuss psychosocial needs of cllient LCSW will call client in 3 weeks to discuss health needs of client  and client management of health needs Client to attend scheduled medical appointments Client to communicate with RN CM to discuss nursing needs of client  *Initial goal documentation        Materials Provided: No  Follow Up Plan: LCSW to call client in next 3 weeks to discuss health concerns of client and client management of health concerns  The patient verbalized understanding of instructions provided today and declined a print copy of patient instruction materials.   [name].Hermenia Fritcher MSW, LCSW Licensed Clinical Social Worker [facility] [phone]

## 2018-06-14 ENCOUNTER — Ambulatory Visit: Payer: Medicare HMO | Admitting: Physical Therapy

## 2018-06-18 ENCOUNTER — Encounter: Payer: Self-pay | Admitting: Family Medicine

## 2018-06-18 ENCOUNTER — Telehealth (INDEPENDENT_AMBULATORY_CARE_PROVIDER_SITE_OTHER): Payer: Medicare HMO | Admitting: Family Medicine

## 2018-06-18 ENCOUNTER — Other Ambulatory Visit: Payer: Self-pay

## 2018-06-18 DIAGNOSIS — R300 Dysuria: Secondary | ICD-10-CM | POA: Diagnosis not present

## 2018-06-18 DIAGNOSIS — Z87448 Personal history of other diseases of urinary system: Secondary | ICD-10-CM | POA: Diagnosis not present

## 2018-06-18 MED ORDER — LIDOCAINE HCL 2 % EX GEL: CUTANEOUS | 3 refills | Status: DC | PRN

## 2018-06-18 NOTE — Patient Instructions (Signed)
Interstitial Cystitis  Interstitial cystitis is inflammation of the bladder. This may cause pain in the bladder area as well as a frequent and urgent need to urinate. The bladder is a hollow organ in the lower part of the abdomen. It stores urine after the urine is made in the kidneys. The severity of interstitial cystitis can vary from person to person. You may have flare-ups, and then your symptoms may go away for a while. For many people, it becomes a long-term (chronic) problem. What are the causes? The cause of this condition is not known. What increases the risk? The following factors may make you more likely to develop this condition:  You are female.  You have fibromyalgia.  You have irritable bowel syndrome (IBS).  You have endometriosis. This condition may be aggravated by:  Stress.  Smoking.  Spicy foods. What are the signs or symptoms? Symptoms of interstitial cystitis vary, and they can change over time. Symptoms may include:  Discomfort or pain in the bladder area, which is in the lower abdomen. Pain can range from mild to severe. The pain may change in intensity as the bladder fills with urine or as it empties.  Pain in the pelvic area, between the hip bones.  An urgent need to urinate.  Frequent urination.  Pain during urination.  Pain during sex.  Blood in the urine. For women, symptoms often get worse during menstruation. How is this diagnosed? This condition is diagnosed based on your symptoms, your medical history, and a physical exam. You may have tests to rule out other conditions, such as:  Urine tests.  Cystoscopy. For this test, a tool similar to a very thin telescope is used to look into your bladder.  Biopsy. This involves taking a sample of tissue from the bladder to be examined under a microscope. How is this treated? There is no cure for this condition, but treatment can help you control your symptoms. Work closely with your health care  provider to find the most effective treatments for you. Treatment options may include:  Medicines to relieve pain and reduce how often you feel the need to urinate.  Learning ways to control when you urinate (bladder training).  Lifestyle changes, such as changing your diet or taking steps to control stress.  Using a device that provides electrical stimulation to your nerves, which can relieve pain (neuromodulation therapy). The device is placed on your back, where it blocks the nerves that cause you to feel pain in your bladder area.  A procedure that stretches your bladder by filling it with air or fluid.  Surgery. This is rare. It is only done for extreme cases, if other treatments do not help. Follow these instructions at home: Bladder training   Use bladder training techniques as directed. Techniques may include: ? Urinating at scheduled times. ? Training yourself to delay urination. ? Doing exercises (Kegel exercises) to strengthen the muscles that control urine flow.  Keep a bladder diary. ? Write down the times that you urinate and any symptoms that you have. This can help you find out which foods, liquids, or activities make your symptoms worse. ? Use your bladder diary to schedule bathroom trips. If you are away from home, plan to be near a bathroom at each of your scheduled times.  Make sure that you urinate just before you leave the house and just before you go to bed. Eating and drinking  Make dietary changes as recommended by your health care provider. You   may need to avoid: ? Spicy foods. ? Foods that contain a lot of potassium.  Limit your intake of beverages that make you need to urinate. These include: ? Caffeinated beverages like soda, coffee, and tea. ? Alcohol. General instructions  Take over-the-counter and prescription medicines only as told by your health care provider.  Do not drink alcohol.  You can try a warm or cool compress over your bladder for  comfort.  Avoid wearing tight clothing.  Do not use any products that contain nicotine or tobacco, such as cigarettes and e-cigarettes. If you need help quitting, ask your health care provider.  Keep all follow-up visits as told by your health care provider. This is important. Contact a health care provider if you have:  Symptoms that do not get better with treatment.  Pain or discomfort that gets worse.  More frequent urges to urinate.  A fever. Get help right away if:  You have no control over when you urinate. Summary  Interstitial cystitis is inflammation of the bladder.  This condition may cause pain in the bladder area as well as a frequent and urgent need to urinate.  You may have flare-ups of the condition, and then it may go away for a while. For many people, it becomes a long-term (chronic) problem.  There is no cure for interstitial cystitis, but treatment methods are available to control your symptoms. This information is not intended to replace advice given to you by your health care provider. Make sure you discuss any questions you have with your health care provider. Document Released: 11/13/2003 Document Revised: 02/06/2017 Document Reviewed: 02/06/2017 Elsevier Interactive Patient Education  2019 [employer] Urinary Tract Infection, Adult A urinary tract infection (UTI) is an infection of any part of the urinary tract. The urinary tract includes:  The kidneys.  The ureters.  The bladder.  The urethra. These organs make, store, and get rid of pee (urine) in the body. What are the causes? This is caused by germs (bacteria) in your genital area. These germs grow and cause swelling (inflammation) of your urinary tract. What increases the risk? You are more likely to develop this condition if:  You have a small, thin tube (catheter) to drain pee.  You cannot control when you pee or poop (incontinence).  You are female, and: ? You use these methods to  prevent pregnancy: ? A medicine that kills sperm (spermicide). ? A device that blocks sperm (diaphragm). ? You have low levels of a female hormone (estrogen). ? You are pregnant.  You have genes that add to your risk.  You are sexually active.  You take antibiotic medicines.  You have trouble peeing because of: ? A prostate that is bigger than normal, if you are female. ? A blockage in the part of your body that drains pee from the bladder (urethra). ? A kidney stone. ? A nerve condition that affects your bladder (neurogenic bladder). ? Not getting enough to drink. ? Not peeing often enough.  You have other conditions, such as: ? Diabetes. ? A weak disease-fighting system (immune system). ? Sickle cell disease. ? Gout. ? Injury of the spine. What are the signs or symptoms? Symptoms of this condition include:  Needing to pee right away (urgently).  Peeing often.  Peeing small amounts often.  Pain or burning when peeing.  Blood in the pee.  Pee that smells bad or not like normal.  Trouble peeing.  Pee that is cloudy.  Fluid coming from the  vagina, if you are female.  Pain in the belly or lower back. Other symptoms include:  Throwing up (vomiting).  No urge to eat.  Feeling mixed up (confused).  Being tired and grouchy (irritable).  A fever.  Watery poop (diarrhea). How is this treated? This condition may be treated with:  Antibiotic medicine.  Other medicines.  Drinking enough water. Follow these instructions at home:  Medicines  Take over-the-counter and prescription medicines only as told by your doctor.  If you were prescribed an antibiotic medicine, take it as told by your doctor. Do not stop taking it even if you start to feel better. General instructions  Make sure you: ? Pee until your bladder is empty. ? Do not hold pee for a long time. ? Empty your bladder after sex. ? Wipe from front to back after pooping if you are a female.  Use each tissue one time when you wipe.  Drink enough fluid to keep your pee pale yellow.  Keep all follow-up visits as told by your doctor. This is important. Contact a doctor if:  You do not get better after 1-2 days.  Your symptoms go away and then come back. Get help right away if:  You have very bad back pain.  You have very bad pain in your lower belly.  You have a fever.  You are sick to your stomach (nauseous).  You are throwing up. Summary  A urinary tract infection (UTI) is an infection of any part of the urinary tract.  This condition is caused by germs in your genital area.  There are many risk factors for a UTI. These include having a small, thin tube to drain pee and not being able to control when you pee or poop.  Treatment includes antibiotic medicines for germs.  Drink enough fluid to keep your pee pale yellow. This information is not intended to replace advice given to you by your health care provider. Make sure you discuss any questions you have with your health care provider. Document Released: 08/31/2007 Document Revised: 09/21/2017 Document Reviewed: 09/21/2017 Elsevier Interactive Patient Education  2019 [employer].

## 2018-06-18 NOTE — Progress Notes (Signed)
  Virtual Visit via telephone Note  I connected with Kristy Garza on 06/18/18 at [phone] by telephone and verified that I am speaking with the correct person using two identifiers. Kristy Garza is currently located at home and none are currently with her during visit. The provider, Ronnie Doss, DO is located in their office at time of visit.  I discussed the limitations, risks, security and privacy concerns of performing an evaluation and management service by telephone and the availability of in person appointments. I also discussed with the patient that there may be a patient responsible charge related to this service. The patient expressed understanding and agreed to proceed.   History and Present Illness: Patient reports onset of dysuria Friday.  She is noted cloudy urine and a mild odor to the urine.  Denies any fevers, chills, back pain, nausea, vomiting.  No increased frequency or urgency.  She has been using a heating pad in efforts to improve symptoms.  She has known interstitial cystitis.  She does have a urologist.  Assessment and Plan:  1. Dysuria Possibly urinary tract infection versus interstitial cystitis.  Patient with known interstitial cystitis.  She had Monurol 3 mg at home.  Okay to take the antibiotic as a single dose.  Caution yeast infection and GI disturbance given antibiotic use.  Okay to use probiotic.  Increase oral fluids.  She will contact the office if symptoms worsen or she develops any worrisome symptoms or signs consistent with pyelonephritis.    I discussed the assessment and treatment plan with the patient. The patient was provided an opportunity to ask questions and all were answered. The patient agreed with the plan and demonstrated an understanding of the instructions.   The patient was advised to call back or seek an in-person evaluation if the symptoms worsen or if the condition fails to improve as anticipated.  The above assessment and  management plan was discussed with the patient. The patient verbalized understanding of and has agreed to the management plan. Patient is aware to call the clinic if symptoms persist or worsen. Patient is aware when to return to the clinic for a follow-up visit. Patient educated on when it is appropriate to go to the emergency department.    I provided 7 minutes of non-face-to-face time during this encounter.    Ronnie Doss, DO

## 2018-06-19 ENCOUNTER — Ambulatory Visit: Payer: Medicare HMO | Admitting: Physical Therapy

## 2018-06-19 DIAGNOSIS — R300 Dysuria: Secondary | ICD-10-CM | POA: Diagnosis not present

## 2018-06-28 ENCOUNTER — Ambulatory Visit (INDEPENDENT_AMBULATORY_CARE_PROVIDER_SITE_OTHER): Payer: Medicare HMO | Admitting: Licensed Clinical Social Worker

## 2018-06-28 DIAGNOSIS — R519 Headache, unspecified: Secondary | ICD-10-CM

## 2018-06-28 DIAGNOSIS — R51 Headache: Secondary | ICD-10-CM

## 2018-06-28 DIAGNOSIS — F411 Generalized anxiety disorder: Secondary | ICD-10-CM

## 2018-06-28 DIAGNOSIS — R5383 Other fatigue: Secondary | ICD-10-CM

## 2018-06-28 DIAGNOSIS — D649 Anemia, unspecified: Secondary | ICD-10-CM | POA: Diagnosis not present

## 2018-06-28 DIAGNOSIS — K219 Gastro-esophageal reflux disease without esophagitis: Secondary | ICD-10-CM

## 2018-06-28 NOTE — Chronic Care Management (AMB) (Signed)
  Care Management Note   Kristy Garza is a 68 y.o. year old female who is a primary care patient of [name], DO. The CM team was consulted for assistance with chronic disease management and care coordination.   I reached out to [name] by phone today.   Review of patient status, including review of consultants reports, relevant laboratory and other test results, and collaboration with appropriate care team members and the patient's provider was performed as part of comprehensive patient evaluation and provision of chronic care management services.   Social Determinants of Health:Client said she has headaches many mornings. She said she has had these headaches for years. Client said that her spouse recently retired and she is glad that he may be able to help her more at home as needed. He plans to drive client to and from her appointments. Client said she is scheduled for colonoscopy on 06/29/2018.   Goals Addressed            This Visit's Progress   . Client said she wants to talk with someone about her health issues and how she deals with them (pt-stated)       Current Barriers:  . Limited access to food . Mental Health Concerns   . Pain issues  Clinical Social Work Clinical Goal(s):  [name] Over the next 30 day days, client will work with SW to address concerns related to health issues of client and how she deals with health issues  Interventions: . Encouraged client to talk with RN CM about nursing needs of client . Provided counseling support for client . Talked with client about health issues of concern  Patient Self Care Activities:  . Attends all scheduled provider appointments  . Self administers prescribed medications . Calls provider office for new concerns or questions  Plan: Patient to contact LCSW to discuss psychosocial needs of cllient LCSW will call client in 3 weeks to discuss health needs of client  and client management of health needs Client  to attend scheduled medical appointments Client to communicate with RN CM to discuss nursing needs of client  *Initial goal documentation    Follow Up Plan:  LCSW to call client in 3 weeks to discuss health needs of client and client management of her health needs.  Kristy Garza.Kristy Garza MSW, LCSW Licensed Clinical Social Worker [facility] [phone]

## 2018-06-28 NOTE — Patient Instructions (Signed)
Licensed Clinical Social Worker Visit Information  Goals we discussed today:  Goals Addressed            This Visit's Progress   . Client said she wants to talk with someone about her health issues and how she deals with them (pt-stated)       Current Barriers:  . Limited access to food . Mental Health Concerns   . Pain issues  Clinical Social Work Clinical Goal(s):  [name] Over the next 30 day days, client will work with SW to address concerns related to health issues of client and how she deals with health issues  Interventions: . Encouraged client to talk with RN CM about nursing needs of client . Provided counseling support for client . Talked with client about health issues of concern  Patient Self Care Activities:  . Attends all scheduled provider appointments  . Self administers prescribed medications . Calls provider office for new concerns or questions  Plan: Patient to contact LCSW to discuss psychosocial needs of cllient LCSW will call client in 3 weeks to discuss health needs of client  and client management of health needs Client to attend scheduled medical appointments Client to communicate with RN CM to discuss nursing needs of client  *Initial goal documentation     Materials Provided: No  Follow Up Plan: LCSW will call client in next 3 weeks to discuss health needs of client and client management of health needs  The patient verbalized understanding of instructions provided today and declined a print copy of patient instruction materials.   [name].Aliha Diedrich MSW, LCSW Licensed Clinical Social Worker [facility] [phone]

## 2018-06-29 DIAGNOSIS — K2950 Unspecified chronic gastritis without bleeding: Secondary | ICD-10-CM | POA: Diagnosis not present

## 2018-06-29 DIAGNOSIS — D124 Benign neoplasm of descending colon: Secondary | ICD-10-CM | POA: Diagnosis not present

## 2018-06-29 DIAGNOSIS — K635 Polyp of colon: Secondary | ICD-10-CM | POA: Diagnosis not present

## 2018-06-29 DIAGNOSIS — D509 Iron deficiency anemia, unspecified: Secondary | ICD-10-CM | POA: Diagnosis not present

## 2018-07-02 ENCOUNTER — Ambulatory Visit: Payer: Self-pay | Admitting: Physical Therapy

## 2018-07-02 DIAGNOSIS — N3010 Interstitial cystitis (chronic) without hematuria: Secondary | ICD-10-CM | POA: Diagnosis not present

## 2018-07-02 DIAGNOSIS — R102 Pelvic and perineal pain: Secondary | ICD-10-CM | POA: Diagnosis not present

## 2018-07-02 DIAGNOSIS — Z86010 Personal history of colonic polyps: Secondary | ICD-10-CM | POA: Insufficient documentation

## 2018-07-03 ENCOUNTER — Ambulatory Visit: Payer: Medicare HMO | Admitting: *Deleted

## 2018-07-03 ENCOUNTER — Other Ambulatory Visit: Payer: Self-pay

## 2018-07-03 DIAGNOSIS — F4322 Adjustment disorder with anxiety: Secondary | ICD-10-CM

## 2018-07-03 DIAGNOSIS — R519 Headache, unspecified: Secondary | ICD-10-CM

## 2018-07-03 DIAGNOSIS — E440 Moderate protein-calorie malnutrition: Secondary | ICD-10-CM

## 2018-07-03 DIAGNOSIS — R51 Headache: Principal | ICD-10-CM

## 2018-07-04 NOTE — Chronic Care Management (AMB) (Signed)
  Chronic Care Management   Note  07/04/2018 Name: Kristy Garza MRN: [id number] DOB: 14-Nov-1950  Kristy Garza is a 68 y.o. year old female primary care patient of [name], DO who was referred for Chronic Care Management Services and Care Coordination related to asthma, trigeminal neuralgia, GAD, and malnutrition. she has consented to CCM services and has been speaking with Legrand Como "Scott" Forrest, LCSW with the [facility] CCM team. Kristy Garza has been provided with the CCM Team's contact information. Scott recommended that I talk with her to identify and discuss any nursing care needs.  I was unable to reach patient by and also unable to leave a voicemail message.   Follow up plan: The CM team will reach out to the patient again over the next 7 days.   Chong Sicilian, RN-BC, BSN Nurse Case Manager [place] [phone]

## 2018-07-11 ENCOUNTER — Other Ambulatory Visit: Payer: Self-pay

## 2018-07-11 ENCOUNTER — Ambulatory Visit: Payer: Medicare HMO | Admitting: *Deleted

## 2018-07-11 DIAGNOSIS — M26623 Arthralgia of bilateral temporomandibular joint: Secondary | ICD-10-CM

## 2018-07-11 DIAGNOSIS — F4322 Adjustment disorder with anxiety: Secondary | ICD-10-CM

## 2018-07-11 DIAGNOSIS — G500 Trigeminal neuralgia: Secondary | ICD-10-CM

## 2018-07-11 NOTE — Patient Instructions (Signed)
I will attempt to reach patient by telephone over the next 14 days.  Chong Sicilian, RN-BC, BSN Nurse Case Manager [place] [phone]

## 2018-07-11 NOTE — Chronic Care Management (AMB) (Signed)
  Chronic Care Management   RN Case Manager Telephone Outreach Note   07/11/2018 Name: Kristy Garza MRN: [id number] DOB: 1950/11/14  Referred by: [name], DO Reason for referral : Chronic Care Management (RNCM initial outreach to discuss nursing care needs)   Kristy Garza is a 68 y.o. year old female who is a primary care patient of [name], DO. The CCM team was consulted for assistance with chronic disease management and care coordination needs.    Review of patient status, including review of consultants reports, relevant laboratory and other test results, and collaboration with appropriate care team members and the patient's provider was performed as part of comprehensive patient evaluation and provision of chronic care management services.    Kristy Garza has been talking with Legrand Como "Scott" Forrest, LCSW with the [facility] CCM Team regarding her psychosocial needs and he asked that I reach out to discuss her nursing care needs.  Kristy Garza has a long history of facial/mouth pain as well as multiple other chronic conditions. Scott asked specifically that I speak with her regarding her mouth/facial pain. Chart review indicates that this has been an ongoing problem for years and that she has been referred to multiple specialists for management. I am not confident that I can provide assistance that will be beneficial for this particular problem but I will talk with Kristy Garza about her expectations and goals regarding pain management.   I attempted to reach Kristy Garza by telephone today but was unsuccessful. I did leave a VM requesting that she return my call at her earliest convenience.    Follow Up Plan RNCM will reach out by telephone over the next 14 days if patient does not return call sooner   Chong Sicilian, RN-BC, BSN Nurse Case Manager [place] [phone]

## 2018-07-17 ENCOUNTER — Ambulatory Visit: Payer: Self-pay | Admitting: Neurology

## 2018-07-19 ENCOUNTER — Ambulatory Visit: Payer: Self-pay | Admitting: Licensed Clinical Social Worker

## 2018-07-19 DIAGNOSIS — F411 Generalized anxiety disorder: Secondary | ICD-10-CM

## 2018-07-19 DIAGNOSIS — E440 Moderate protein-calorie malnutrition: Secondary | ICD-10-CM

## 2018-07-19 DIAGNOSIS — R519 Headache, unspecified: Secondary | ICD-10-CM

## 2018-07-19 DIAGNOSIS — D649 Anemia, unspecified: Secondary | ICD-10-CM

## 2018-07-19 DIAGNOSIS — R51 Headache: Secondary | ICD-10-CM

## 2018-07-19 DIAGNOSIS — R5383 Other fatigue: Secondary | ICD-10-CM

## 2018-07-19 DIAGNOSIS — K219 Gastro-esophageal reflux disease without esophagitis: Secondary | ICD-10-CM

## 2018-07-19 NOTE — Chronic Care Management (AMB) (Signed)
  Care Management Note   Kristy Garza is a 68 yGarzao. year old female who is a primary care patient of Kristy Norlander, DO. The CM team was consulted for assistance with chronic disease management and care coordination.   I reached out to Kristy Garza Kitchen by phone today.   Review of patient status, including review of consultants reports, relevant laboratory and other test results, and collaboration with appropriate care team members and the patient's provider was performed as part of comprehensive patient evaluation and provision of chronic care management services.   Social Determinants of Health:Risk of social isolation    Office Visit from 05/23/2018 in [facility]  PHQ-9 Total Score  0     GAD 7 : Generalized Anxiety Score 05/23/2018 04/11/2018  Nervous, Anxious, on Edge 3 1  Control/stop worrying 2 0  Worry too much - different things 2 0  Trouble relaxing 2 0  Restless 1 0  Easily annoyed or irritable 1 0  Afraid - awful might happen 1 0  Total GAD 7 Score 12 1   Goals Addressed            This Visit's Progress   . Client said she wants to talk with someone about her health issues and how she deals with them (pt-stated)       Current Barriers:  . Limited access to food . Mental Health Concerns   . Pain issues  Clinical Social Work Clinical Goal(s):  Kristy Garza Kitchen Over the next 30 day days, client will work with SW to address concerns related to health issues of client and how she deals with health issues  Interventions: . Encouraged client to talk with RN CM about nursing needs of client . Provided counseling support for client . Talked with client about health issues of concern . Talked with client about CCM program support . Talked with client about health issues of her family member (mother)  Patient Self Care Activities:  . Attends all scheduled provider appointments  . Self administers prescribed medications . Calls provider office for new  concerns or questions  Plan: Patient to contact Kristy Garza to discuss psychosocial needs of cllient Kristy Garza will call client in 3 weeks to discuss health needs of client  and client management of health needs Client to attend scheduled medical appointments Client to communicate with RN CM to discuss nursing needs of client  *Initial goal documentation    Client talked with Kristy Garza about her jaw pain and neck pain. She talked of using Tramadol as prescribed for pain issues. She said she uses a mouth guard at night while sleeping. She said she is not currently going to a Pain Clinic. Client said she is stressed related to medical needs of her mother, needs of her sister, and related to client's own medical issues. Client said her spouse is recently retired.    Follow Up Plan: Kristy Garza to call client in next 3 weeks to talk with client about health needs of client and management of health needs of client  Kristy GarzaGarzaKristy Garza MSW, Kristy Garza Licensed Clinical Social Worker [facility] [phone]

## 2018-07-19 NOTE — Patient Instructions (Addendum)
Licensed Clinical Social Worker Visit Information  Goals we discussed today:  Goals Addressed            This Visit's Progress   . Client said she wants to talk with someone about her health issues and how she deals with them (pt-stated)       Current Barriers:  . Limited access to food . Mental Health Concerns   . Pain issues  Clinical Social Work Clinical Goal(s):  [name] Over the next 30 day days, client will work with SW to address concerns related to health issues of client and how she deals with health issues  Interventions: . Encouraged client to talk with RN CM about nursing needs of client . Provided counseling support for client . Talked with client about health issues of concern . Talked with client about CCM program support . Talked with client about health needs of family member (mother of client)  Patient Self Care Activities:  . Attends all scheduled provider appointments  . Self administers prescribed medications . Calls provider office for new concerns or questions  Plan: Patient to contact LCSW to discuss psychosocial needs of cllient LCSW will call client in 3 weeks to discuss health needs of client  and client management of health needs Client to attend scheduled medical appointments Client to communicate with RN CM to discuss nursing needs of client  *Initial goal documentation     Materials Provided: No  Follow Up Plan: LCSW to call client in next 3 weeks to discuss health needs of client and client management of health needs of client  The patient verbalized understanding of instructions provided today and declined a print copy of patient instruction materials.   [name].Orlie Cundari MSW, LCSW Licensed Clinical Social Worker [facility] [phone]

## 2018-07-20 ENCOUNTER — Ambulatory Visit: Payer: Medicare HMO | Admitting: *Deleted

## 2018-07-20 DIAGNOSIS — J301 Allergic rhinitis due to pollen: Secondary | ICD-10-CM

## 2018-07-20 DIAGNOSIS — R519 Headache, unspecified: Secondary | ICD-10-CM

## 2018-07-20 DIAGNOSIS — F411 Generalized anxiety disorder: Secondary | ICD-10-CM

## 2018-07-20 DIAGNOSIS — R51 Headache: Secondary | ICD-10-CM

## 2018-07-20 NOTE — Patient Instructions (Signed)
Visit Information  Goals Addressed      Patient Stated   . "I would like to control the pain in my  face/jaw" (pt-stated)       Current Barriers:  . Non-adherence to prescribed medication regimen due to anxiety over potential addiction . Generalized anxiety disorder  Nurse Case Manager Clinical Goal(s):  [name] Over the next 30 days, patient will demonstrate improved adherence to prescribed treatment plan for facial/jaw pain management as evidenced byself reported improvement in pain control. . Over the next 30 days, patient will demonstrate understanding of rationale for each prescribed medication as evidenced by adherence to directions for taking Tramadol 50mg  BID.  Interventions:  . Evaluation of current treatment plan related to pain management and patient's adherence to plan as established by provider. . Reviewed medications with patient and discussed: o Tramadol 50mg . Reviewed directions and also addressed her concerns about it's addiction potential and reassured her that taking this medication as prescribed has a low addiction potential.  o Zanaflex.Reviewed directions and patient's pattern of use . Discussed plans with patient for ongoing care management follow up and provided patient with direct contact information for care management team  . Recommended that she take medications as prescribed.  . Advised that it is much easier to manage pain by utilizing the prescribed treatments regularly and much harder to get the pain back under control if she does not stay on top of it. . Reviewed EMR for history and past treatments.  o Patient has been seen by dentist, oral-maxillofacial surgeon, neurologist, physical therapist, & integrated medicine specialist to address this pain o Multiple treatment modalities have been tried. She is scheduled for a TMJ injection but has been reluctant to proceed.  . Encouraged patient to keep follow up appointment with specialist and to proceed with their  treatment recommendations  Patient Self Care Activities:  . Self administers medications as prescribed . Attends all scheduled provider appointments . Calls pharmacy for medication refills . Attends church or other social activities . Performs ADL's independently . Performs IADL's independently . Calls provider office for new concerns or questions       . "I would like to get better control of my allergies" (pt-stated)       Current Barriers:  . Anxiety  Nurse Case Manager Clinical Goal(s):  [name] Over the next 30 days, patient will demonstrate improved health management independence as evidenced bybetter control of allergy symptoms.  Interventions:  . Reviewed medications with patient and discussed switching from OTC Allegra to Zyrtec to manage allergy symptoms since Allegra does not seem as effective right now.   . Discussed previous success with rotating antihistamines  Patient Self Care Activities:  . Self administers medications as prescribed . Attends all scheduled provider appointments . Calls pharmacy for medication refills . Attends church or other social activities . Performs ADL's independently . Performs IADL's independently . Calls provider office for new concerns or questions         Plan  RNCM will reach out via telephone over the next 30 days  Patient will continue to talk with LCSW regarding anxiety  Patient will keep all scheduled appointments and discuss treatment options with her providers  Patient will take all medications as prescribed, including Tramadol.   The patient verbalized understanding of instructions provided today and declined a print copy of patient instruction materials.    Chong Sicilian, RN-BC, BSN Nurse Case Manager [place] [phone]

## 2018-07-20 NOTE — Chronic Care Management (AMB) (Signed)
Chronic Care Management   RN Case Management Telephone Note   07/20/2018 Name: Kristy Garza MRN: [id number] DOB: 01-Aug-1950  Referred by: [name], DO Reason for referral : Chronic Care Management (RN follow up)   REBACCA Garza is a 68 y.o. year old female who is a primary care patient of [name], DO. The CCM team was consulted for assistance with chronic disease management and care coordination needs.  Kristy Garza has been speaking with Legrand Como "Scott" Forrest, LCSW with the [facility] CCM Team regarding her psychosocial needs and he recommended that she speak with me regarding her nursing care needs.  Review of patient status, including review of consultants reports, relevant laboratory and other test results, and collaboration with appropriate care team members and the patient's provider was performed as part of comprehensive patient evaluation and provision of chronic care management services.    I spoke with Kristy Garza by telephone today. She is well known to me and has a long standing history of allergies, asthma, GAD, facial pain, and vocal cord dysfunction. Her primary concern today was improvement of her facial pain. She is prescribed Zanaflex and Tramadol for this but is reluctant to take Tramadol because of it's addiction potential. She does take Zanaflex regularly. She has seen multiple specialists to address and treat this pain, including, dentist, maxillofacial surgeon, neurologist, physical therapist, and an integrated medicine specialist. She is scheduled for TMJ steroid injection but is reluctant to proceed. I encouraged her to keep that appointment and to address her concerns with her provider. Effective treatment seems to be inhibited by her anxiety surrounding those treatments and their potential side effects. Her secondary complaint today is that [place] does not seem to be controlling her allergy symptoms as well as it has in the past. She has had success with  alternating between Allegra and Zyrtec. Loratadine does not work well for her.   Goals Addressed      Patient Stated    "I would like to control the pain in my  face/jaw" (pt-stated)       Current Barriers:   Non-adherence to prescribed medication regimen due to anxiety over potential addiction  Generalized anxiety disorder  Nurse Case Manager Clinical Goal(s):   Over the next 30 days, patient will demonstrate improved adherence to prescribed treatment plan for facial/jaw pain management as evidenced byself reported improvement in pain control.  Over the next 30 days, patient will demonstrate understanding of rationale for each prescribed medication as evidenced by adherence to directions for taking Tramadol 50mg  BID.  Interventions:   Evaluation of current treatment plan related to pain management and patient's adherence to plan as established by provider.  Reviewed medications with patient and discussed: o Tramadol 50mg . Reviewed directions and also addressed her concerns about it's addiction potential and reassured her that taking this medication as prescribed has a low addiction potential.  o Zanaflex.Reviewed directions and patient's pattern of use  Discussed plans with patient for ongoing care management follow up and provided patient with direct contact information for care management team   Recommended that she take medications as prescribed.   Advised that it is much easier to manage pain by utilizing the prescribed treatments regularly and much harder to get the pain back under control if she does not stay on top of it.  Reviewed EMR for history and past treatments.  o Patient has been seen by dentist, oral-maxillofacial surgeon, neurologist, physical therapist, & integrated medicine specialist to address this  pain o Multiple treatment modalities have been tried. She is scheduled for a TMJ injection but has been reluctant to proceed.   Encouraged patient to keep follow  up appointment with specialist and to proceed with their treatment recommendations  Continue to work with LCSW regarding anxiety  Patient Self Care Activities:   Self administers medications as prescribed  Attends all scheduled provider appointments  Calls pharmacy for medication refills  Attends church or other social activities  Performs ADL's independently  Performs IADL's independently  Calls provider office for new concerns or questions      "I would like to get better control of my allergies" (pt-stated)       Current Barriers:   Anxiety  Nurse Case Manager Clinical Goal(s):   Over the next 30 days, patient will demonstrate improved health management independence as evidenced bybetter control of allergy symptoms.  Interventions:   Reviewed medications with patient and discussed switching from OTC Allegra to Zyrtec to manage allergy symptoms since Allegra does not seem as effective right now.    Discussed previous success with rotating antihistamines  Patient Self Care Activities:   Self administers medications as prescribed  Attends all scheduled provider appointments  Calls pharmacy for medication refills  Attends church or other social activities  Performs ADL's independently  Performs IADL's independently  Calls provider office for new concerns or questions       Plan  RNCM will reach out via telephone over the next 30 days  Patient will continue to talk with LCSW regarding anxiety  Patient will keep all scheduled appointments and discuss treatment options with her providers  Patient will take all medications as prescribed, including Tramadol.   Chong Sicilian, RN-BC, BSN Nurse Case Manager [place] [phone]

## 2018-08-08 ENCOUNTER — Other Ambulatory Visit: Payer: Self-pay

## 2018-08-08 ENCOUNTER — Encounter: Payer: Self-pay | Admitting: Family Medicine

## 2018-08-08 ENCOUNTER — Ambulatory Visit (INDEPENDENT_AMBULATORY_CARE_PROVIDER_SITE_OTHER): Payer: Medicare HMO | Admitting: Family Medicine

## 2018-08-08 DIAGNOSIS — N3090 Cystitis, unspecified without hematuria: Secondary | ICD-10-CM | POA: Diagnosis not present

## 2018-08-08 NOTE — Progress Notes (Signed)
Subjective:    Patient ID: Kristy Garza, female    DOB: Nov 16, 1950, 68 y.o.   MRN: [id number]   HPI: Kristy Garza is a 68 y.o. female presenting for burning with urination and frequency starting last night. Denies fever . No flank pain. No nausea, vomiting. Pt. Had BM in sleep 4 nights ago. Pt. Has monurol at home. She wants to take that. Declines any other antibiotic prescription suggested.    Depression screen [facility] 2/9 05/23/2018 04/13/2018 04/11/2018 02/15/2018 01/04/2018  Decreased Interest 0 1 0 0 0  Down, Depressed, Hopeless 0 1 0 0 0  PHQ - 2 Score 0 2 0 0 0  Altered sleeping 0 2 0 - -  Tired, decreased energy 0 2 0 - -  Change in appetite 0 1 0 - -  Feeling bad or failure about yourself  0 0 0 - -  Trouble concentrating 0 0 0 - -  Moving slowly or fidgety/restless 0 0 0 - -  Suicidal thoughts 0 0 0 - -  PHQ-9 Score 0 7 0 - -  Some recent data might be hidden     Relevant past medical, surgical, family and social history reviewed and updated as indicated.  Interim medical history since our last visit reviewed. Allergies and medications reviewed and updated.  ROS:  Review of Systems  Constitutional: Negative for chills, diaphoresis and fever.  HENT: Negative for congestion.   Eyes: Negative for visual disturbance.  Respiratory: Negative for cough and shortness of breath.   Cardiovascular: Negative for chest pain and palpitations.  Gastrointestinal: Negative for constipation, diarrhea and nausea.  Genitourinary: Positive for dysuria, frequency and urgency. Negative for decreased urine volume, flank pain, hematuria, menstrual problem and pelvic pain.  Musculoskeletal: Negative for arthralgias and joint swelling.  Skin: Negative for rash.  Neurological: Negative for dizziness and numbness.     Social History   Tobacco Use  Smoking Status Never Smoker  Smokeless Tobacco Never Used       Objective:     Wt Readings from Last 3 Encounters:  05/23/18 107 lb  (48.5 kg)  04/13/18 110 lb (49.9 kg)  04/11/18 108 lb (49 kg)     Exam deferred. Pt. [place] due to COVID 19. Phone visit performed.   Assessment & Plan:   1. Cystitis     Use monurol 3 gm. One packet X 1. Notify office if sx not resolved within 24 hours, or if they worsen.  Use miralax for constipation.  No orders of the defined types were placed in this encounter.     Diagnoses and all orders for this visit:  Cystitis    Virtual Visit via telephone Note  I discussed the limitations, risks, security and privacy concerns of performing an evaluation and management service by telephone and the availability of in person appointments. The patient was identified with two identifiers. Pt.expressed understanding and agreed to proceed. Pt. Is at home. Dr. Livia Snellen is in his office.  Follow Up Instructions:   I discussed the assessment and treatment plan with the patient. The patient was provided an opportunity to ask questions and all were answered. The patient agreed with the plan and demonstrated an understanding of the instructions.   The patient was advised to call back or seek an in-person evaluation if the symptoms worsen or if the condition fails to improve as anticipated.   Total minutes including chart review and phone contact time: 9   Follow up plan: Return  if symptoms worsen or fail to improve.  [name], MD [place]

## 2018-08-09 ENCOUNTER — Ambulatory Visit (INDEPENDENT_AMBULATORY_CARE_PROVIDER_SITE_OTHER): Payer: Medicare HMO | Admitting: Licensed Clinical Social Worker

## 2018-08-09 DIAGNOSIS — F411 Generalized anxiety disorder: Secondary | ICD-10-CM

## 2018-08-09 DIAGNOSIS — D649 Anemia, unspecified: Secondary | ICD-10-CM | POA: Diagnosis not present

## 2018-08-09 DIAGNOSIS — K219 Gastro-esophageal reflux disease without esophagitis: Secondary | ICD-10-CM

## 2018-08-09 DIAGNOSIS — E440 Moderate protein-calorie malnutrition: Secondary | ICD-10-CM

## 2018-08-09 DIAGNOSIS — R5383 Other fatigue: Secondary | ICD-10-CM

## 2018-08-09 DIAGNOSIS — R519 Headache, unspecified: Secondary | ICD-10-CM

## 2018-08-09 NOTE — Chronic Care Management (AMB) (Signed)
  Care Management Note   Kristy Garza is a 68 y.o. year old female who is a primary care patient of [name], DO. The CM team was consulted for assistance with chronic disease management and care coordination.   I reached out to [name] by phone today.   Review of patient status, including review of consultants reports, relevant laboratory and other test results, and collaboration with appropriate care team members and the patient's provider was performed as part of comprehensive patient evaluation and provision of chronic care management services.     Office Visit from 05/23/2018 in [place]  PHQ-9 Total Score  0     Goals Addressed            This Visit's Progress   . Client said she wants to talk with someone about her health issues and how she deals with them (pt-stated)       Current Barriers:  . Limited access to food . Mental Health Concerns   . Pain issues  Clinical Social Work Clinical Goal(s):  [name] Over the next 30 day days, client will work with LCSW to address concerns related to health issues of client and how she deals with health issues  Interventions: . Encouraged client to talk with RN CM about nursing needs of client . Provided counseling support for client . Talked with client about CCM program support  . Talked with client about pain issues of client  . Talked with client about her social support network (spouse, daughters)  Patient Self Care Activities:  . Attends all scheduled provider appointments  . Self administers prescribed medications . Calls provider office for new concerns or questions  Plan: Patient to contact LCSW to discuss psychosocial needs of cllient LCSW will call client in next 3 weeks to discuss health needs of client  and client management of health needs Client to attend scheduled medical appointments Client to communicate with RN CM to discuss nursing needs of client  *Initial goal  documentation     Follow Up Plan: LCSW to call client in next 3 weeks to discuss health needs of client and client management of health needs  [name] Moder MSW, LCSW Licensed Clinical Social Worker [facility] Care Management [phone]

## 2018-08-09 NOTE — Patient Instructions (Addendum)
Licensed Clinical Social Worker Visit Information  Goals we discussed today:  Goals Addressed            This Visit's Progress   . Client said she wants to talk with someone about her health issues and how she deals with them (pt-stated)       Current Barriers:  . Limited access to food . Mental Health Concerns   . Pain issues  Clinical Social Work Clinical Goal(s):  [name] Over the next 30 day days, client will work with LCSW to address concerns related to health issues of client and how she deals with health issues  Interventions: . Encouraged client to talk with RN CM about nursing needs of client . Provided counseling support for client . Talked with client about pain issues of client  .  Talked with client about CCM program support   . Talked with client about her social support network (spouse, daughters)   Patient Self Care Activities:  . Attends all scheduled provider appointments  . Self administers prescribed medications . Calls provider office for new concerns or questions  Plan: Patient to contact LCSW to discuss psychosocial needs of cllient LCSW will call client in 3 weeks to discuss health needs of client  and client management of health needs Client to attend scheduled medical appointments Client to communicate with RN CM to discuss nursing needs of client  *Initial goal documentation      Materials Provided: No  Follow Up Plan: LCSW to call client in next 3 weeks to discuss health needs of client and client management of health needs.  The patient verbalized understanding of instructions provided today and declined a print copy of patient instruction materials.   [name].Shantese Raven MSW, LCSW Licensed Clinical Social Worker [facility] Care Management [phone]

## 2018-08-15 ENCOUNTER — Telehealth: Payer: Self-pay | Admitting: Family Medicine

## 2018-08-15 ENCOUNTER — Other Ambulatory Visit: Payer: Self-pay | Admitting: Family Medicine

## 2018-08-15 DIAGNOSIS — R300 Dysuria: Secondary | ICD-10-CM

## 2018-08-15 NOTE — Telephone Encounter (Signed)
Left message for patient ,  May leave urone specimen for recheck at our lab.

## 2018-08-15 NOTE — Telephone Encounter (Signed)
I ordered the test that was requested. Thanks, WS

## 2018-08-15 NOTE — Telephone Encounter (Signed)
Telephone visit on May 13 with Dr. Livia Snellen.   Patient has no symptoms but wants to leave a urine specimen to rule out infection.  Please advise.

## 2018-08-16 ENCOUNTER — Other Ambulatory Visit: Payer: Self-pay

## 2018-08-16 ENCOUNTER — Other Ambulatory Visit: Payer: Medicare HMO

## 2018-08-16 DIAGNOSIS — R5381 Other malaise: Secondary | ICD-10-CM | POA: Diagnosis not present

## 2018-08-16 DIAGNOSIS — M810 Age-related osteoporosis without current pathological fracture: Secondary | ICD-10-CM | POA: Diagnosis not present

## 2018-08-16 DIAGNOSIS — R300 Dysuria: Secondary | ICD-10-CM

## 2018-08-16 DIAGNOSIS — N951 Menopausal and female climacteric states: Secondary | ICD-10-CM | POA: Diagnosis not present

## 2018-08-16 LAB — URINALYSIS, COMPLETE
Bilirubin, UA: NEGATIVE
Glucose, UA: NEGATIVE
Ketones, UA: NEGATIVE
Leukocytes,UA: NEGATIVE
Nitrite, UA: NEGATIVE
Protein,UA: NEGATIVE
RBC, UA: NEGATIVE
Specific Gravity, UA: <1.005 — ABNORMAL LOW (ref 1.005–1.030)
Urobilinogen, Ur: 0.2 mg/dL (ref 0.2–1.0)
pH, UA: 5.5 (ref 5.0–7.5)

## 2018-08-16 LAB — MICROSCOPIC EXAMINATION
Bacteria, UA: NONE SEEN
Epithelial Cells (non renal): NONE SEEN /hpf (ref 0–10)
RBC, Urine: NONE SEEN /hpf (ref 0–2)
Renal Epithel, UA: NONE SEEN /hpf
WBC, UA: NONE SEEN /hpf (ref 0–5)

## 2018-08-23 ENCOUNTER — Ambulatory Visit (INDEPENDENT_AMBULATORY_CARE_PROVIDER_SITE_OTHER): Payer: Medicare HMO | Admitting: Family Medicine

## 2018-08-23 ENCOUNTER — Encounter: Payer: Self-pay | Admitting: Family Medicine

## 2018-08-23 ENCOUNTER — Other Ambulatory Visit: Payer: Self-pay

## 2018-08-23 DIAGNOSIS — R197 Diarrhea, unspecified: Secondary | ICD-10-CM

## 2018-08-23 DIAGNOSIS — R110 Nausea: Secondary | ICD-10-CM | POA: Diagnosis not present

## 2018-08-23 DIAGNOSIS — K648 Other hemorrhoids: Secondary | ICD-10-CM | POA: Diagnosis not present

## 2018-08-23 LAB — URINE CULTURE

## 2018-08-23 MED ORDER — ONDANSETRON HCL 4 MG PO TABS: 4.0000 mg | ORAL_TABLET | Freq: Three times a day (TID) | ORAL | 0 refills | Status: DC | PRN

## 2018-08-23 MED ORDER — DIPHENOXYLATE-ATROPINE 2.5-0.025 MG PO TABS: 1.0000 | ORAL_TABLET | Freq: Four times a day (QID) | ORAL | 0 refills | Status: AC | PRN

## 2018-08-23 NOTE — Progress Notes (Signed)
Virtual Visit via telephone Note Due to COVID-19, visit is conducted virtually and was requested by patient. This visit type was conducted due to national recommendations for restrictions regarding the COVID-19 Pandemic (e.g. social distancing) in an effort to limit this patient's exposure and mitigate transmission in our community. All issues noted in this document were discussed and addressed.  A physical exam was not performed with this format.   I connected with Kristy Garza Kitchen on 08/23/18 at 1400 by telephone and verified that I am speaking with the correct person using two identifiers. [name] and family is currently with them during visit. The provider, Monia Pouch, FNP is located in their office at time of visit.  I discussed the limitations, risks, security and privacy concerns of performing an evaluation and management service by telephone and the availability of in person appointments. I also discussed with the patient that there may be a patient responsible charge related to this service. The patient expressed understanding and agreed to proceed.  Subjective:  Patient ID: Kristy Garza, female    DOB: February 01, 1951, 68 y.o.   MRN: [id number]  Chief Complaint:  Diarrhea and Hemorrhoids   HPI: Kristy Garza is a 68 y.o. female presenting on 08/23/2018 for Diarrhea and Hemorrhoids   Pt reports she has had diarrhea for at least 3 days. Pt states this has caused her to develop a hemorrhoid. She states she does have slight nausea with the diarrhea. No abdominal pain. States she has tried over the counter remedies as suggested by the pharmacist without relief. Pt states she went out to eat and ate some fried, greasy foods. She states she has IBS and new this was not a good idea. States she has had diarrhea since this time. She denies fever, chills, weakness, abdominal pain, vomiting, melena, hematochezia, or confusion. No decrease in urinary  output. Pt states she is very concerned because they are leaving the beach to come home and she does not want to have an accident on the trip home.     Relevant past medical, surgical, family, and social history reviewed and updated as indicated.  Allergies and medications reviewed and updated.   Past Medical History:  Diagnosis Date   Acid reflux    Allergy    Arthritis    ARTHRITIS IN NECK BY DR. Alroy Dust [facility]   Asthma    Interstitial cystitis    Osteoporosis    PONV (postoperative nausea and vomiting)    Tinnitus    Trigeminal neuralgia    Atypical trigeminal neuralgia    Past Surgical History:  Procedure Laterality Date   ABDOMINAL HYSTERECTOMY  2000   CYSTOSCOPY WITH HYDRODISTENSION AND BIOPSY N/A 06/11/2012   Procedure: CYSTOSCOPY/BIOPSY/HYDRODISTENSION with Instillation of Pyridium and Marcaine and Kenalog;  Surgeon: Ailene Rud, MD;  Location: [facility];  Service: Urology;  Laterality: N/A;  1 hour requested for this case  BLADDER BIOPSY   ETHMOIDECTOMY  2012   SEPTOPLASTY  1980's   vocal cord surgery   1990's   polyp removal    Social History   Socioeconomic History   Marital status: Married    Spouse name: Not on file   Number of children: 3   Years of education: 12   Highest education level: Some college, no degree  Occupational History   Occupation: CNA    Comment: Retired  Scientist, product/process development strain: Not on file  Food insecurity:    Worry: Never true    Inability: Never true   Transportation needs:    Medical: No    Non-medical: No  Tobacco Use   Smoking status: Never Smoker   Smokeless tobacco: Never Used  Substance and Sexual Activity   Alcohol use: No    Alcohol/week: 0.0 standard drinks    Comment: 01-19-2016 per pt no   Drug use: No    Comment: 01-19-2016 per pt no    Sexual activity: Never  Lifestyle   Physical activity:    Days per week: 0 days    Minutes per  session: 0 min   Stress: Not at all  Relationships   Social connections:    Talks on phone: More than three times a week    Gets together: More than three times a week    Attends religious service: Never    Active member of club or organization: No    Attends meetings of clubs or organizations: Never    Relationship status: Married   Intimate partner violence:    Fear of current or ex partner: No    Emotionally abused: No    Physically abused: No    Forced sexual activity: No  Other Topics Concern   Not on file  Social History Narrative   Lives with husband.     Outpatient Encounter Medications as of 08/23/2018  Medication Sig   acetaminophen (TYLENOL) 500 MG tablet Take by mouth.   albuterol (PROVENTIL HFA;VENTOLIN HFA) 108 (90 Base) MCG/ACT inhaler Inhale 2 puffs into the lungs every 4 (four) hours as needed for wheezing or shortness of breath.   amitriptyline (ELAVIL) 10 MG tablet TAKE 1 TABLET BY MOUTH EVERY DAY AT NIGHT   ARNUITY ELLIPTA 200 MCG/ACT AEPB TAKE 1 PUFF BY MOUTH EVERY DAY   azelastine (ASTELIN) 0.1 % nasal spray PLACE 2 SPRAYS INTO BOTH NOSTRILS 2 (TWO) TIMES DAILY. USE IN EACH NOSTRIL AS DIRECTED   b complex vitamins capsule Take 1 capsule by mouth daily.   bisacodyl (DULCOLAX) 5 MG EC tablet Take 5 mg by mouth daily as needed for moderate constipation.   calcium citrate-vitamin D (CITRACAL+D) 315-200 MG-UNIT tablet Take 2 tablets by mouth 2 (two) times daily.   dextromethorphan-guaiFENesin (MUCINEX DM) 30-600 MG 12hr tablet Take 1 tablet by mouth 2 (two) times daily.   diphenoxylate-atropine (LOMOTIL) 2.5-0.025 MG tablet Take 1 tablet by mouth 4 (four) times daily as needed for up to 3 days for diarrhea or loose stools.   Ergocalciferol (VITAMIN D2) 2000 units TABS Take 2,000 Units by mouth daily.   lidocaine (XYLOCAINE) 2 % jelly Apply topically as needed.   lidocaine (XYLOCAINE) 5 % ointment Apply daily as needed   LORazepam (ATIVAN) 0.5 MG  tablet Take 1 tablet (0.5 mg total) by mouth 2 (two) times daily as needed for anxiety. (Patient not taking: Reported on 05/23/2018)   Methylcellulose, Laxative, (CITRUCEL PO) Take by mouth.   mometasone (ELOCON) 0.1 % ointment 1 application to skin lesions twice daily until resolved.   mometasone (NASONEX) 50 MCG/ACT nasal spray PLACE 2 SPRAYS INTO THE NOSE DAILY.   ondansetron (ZOFRAN) 4 MG tablet Take 1 tablet (4 mg total) by mouth every 8 (eight) hours as needed for nausea or vomiting.   pantoprazole (PROTONIX) 40 MG tablet Take 1 tablet (40 mg total) by mouth daily.   phenylephrine (SUDAFED PE) 10 MG TABS tablet Take 10 mg by mouth every 4 (four) hours as needed.  Polyethylene Glycol 3350 (MIRALAX PO) Take by mouth as needed.   Probiotic Product (PROBIOTIC PO) Take by mouth daily.   Simethicone (GAS-X PO) Take by mouth.   tizanidine (ZANAFLEX) 2 MG capsule Take 1-2 capsules (2-4 mg total) by mouth 3 (three) times daily as needed for muscle spasms.   traMADol (ULTRAM) 50 MG tablet Take 1 tablet (50 mg total) by mouth every 12 (twelve) hours as needed.   UNABLE TO FIND Take 1 capsule by mouth daily. Dv3- vitamin D 3 plus immune support   [DISCONTINUED] ondansetron (ZOFRAN) 4 MG tablet Take 1 tablet (4 mg total) by mouth every 8 (eight) hours as needed for nausea or vomiting.   No facility-administered encounter medications on file as of 08/23/2018.     Allergies  Allergen Reactions   Cefuroxime Axetil Other (See Comments)    Extreme gas   Gabapentin Other (See Comments)    Constipation Constipation   Pregabalin Other (See Comments)    Drowsiness, dry mouth Drowsiness, dry mouth Makes her tired and thirsty   Augmentin [Amoxicillin-Pot Clavulanate] Diarrhea   Avelox [Moxifloxacin Hcl In Nacl] Other (See Comments)    Tremors    Biaxin [Clarithromycin]     Unsure of reaction   Cedax [Ceftibuten] Other (See Comments)    tremors   Ciprofloxacin Other (See  Comments) and Nausea And Vomiting    Blurred vision Blurred vision Pt can't remember reaction   Clindamycin/Lincomycin     tachycardia   Levofloxacin Other (See Comments)    Feels dehydrated   Lincomycin Other (See Comments)    tachycardia   Montelukast Sodium Other (See Comments)    Numbness and tingling in hand. Numbness and tingling in hand.   Sulfonamide Derivatives Other (See Comments)    Gi upset    Review of Systems  Constitutional: Positive for appetite change. Negative for activity change, chills, diaphoresis, fatigue, fever and unexpected weight change.  Respiratory: Negative for chest tightness and shortness of breath.   Cardiovascular: Negative for chest pain, palpitations and leg swelling.  Gastrointestinal: Positive for diarrhea, nausea and rectal pain. Negative for abdominal distention, abdominal pain, anal bleeding, blood in stool, constipation and vomiting.  Genitourinary: Negative for decreased urine volume and difficulty urinating.  Musculoskeletal: Negative for arthralgias and myalgias.  Neurological: Negative for dizziness, syncope, weakness, light-headedness and headaches.  Psychiatric/Behavioral: Negative for confusion.  All other systems reviewed and are negative.        Observations/Objective: No vital signs or physical exam, this was a telephone or virtual health encounter.  Pt alert and oriented, answers all questions appropriately, and able to speak in full sentences.    Assessment and Plan: Jlyn was seen today for diarrhea and hemorrhoids.  Diagnoses and all orders for this visit:  Diarrhea in adult patient No fever, chills, abdominal pain, weakness, or recent antibiotic use. No known sick exposures. BRAT diet. Medications only if unable to control diarrhea with over the counter imodium. Report any new or worsening symptoms.  -     diphenoxylate-atropine (LOMOTIL) 2.5-0.025 MG tablet; Take 1 tablet by mouth 4 (four) times daily as needed  for up to 3 days for diarrhea or loose stools.  Other hemorrhoids Symptomatic care discussed. Report any new or worsening symptoms.   Nausea in adult BRAT diet discussed. Medications as prescribed. Report any new or worsening symptoms.  -     ondansetron (ZOFRAN) 4 MG tablet; Take 1 tablet (4 mg total) by mouth every 8 (eight) hours as needed for  nausea or vomiting.     Follow Up Instructions: Return if symptoms worsen or fail to improve.    I discussed the assessment and treatment plan with the patient. The patient was provided an opportunity to ask questions and all were answered. The patient agreed with the plan and demonstrated an understanding of the instructions.   The patient was advised to call back or seek an in-person evaluation if the symptoms worsen or if the condition fails to improve as anticipated.  The above assessment and management plan was discussed with the patient. The patient verbalized understanding of and has agreed to the management plan. Patient is aware to call the clinic if symptoms persist or worsen. Patient is aware when to return to the clinic for a follow-up visit. Patient educated on when it is appropriate to go to the emergency department.    I provided 15 minutes of non-face-to-face time during this encounter. The call started at 1400. The call ended at 1415. The other time was used for coordination of care.    Monia Pouch, FNP-C [facility] [address] [place], [place] [address] [phone]

## 2018-08-24 ENCOUNTER — Other Ambulatory Visit: Payer: Self-pay | Admitting: Allergy and Immunology

## 2018-08-24 DIAGNOSIS — J019 Acute sinusitis, unspecified: Secondary | ICD-10-CM

## 2018-08-27 ENCOUNTER — Other Ambulatory Visit: Payer: Self-pay

## 2018-08-28 ENCOUNTER — Ambulatory Visit (INDEPENDENT_AMBULATORY_CARE_PROVIDER_SITE_OTHER): Payer: Medicare HMO | Admitting: Family Medicine

## 2018-08-28 VITALS — BP 99/64 | HR 74 | Temp 97.4°F | Ht 69.0 in | Wt 107.0 lb

## 2018-08-28 DIAGNOSIS — M810 Age-related osteoporosis without current pathological fracture: Secondary | ICD-10-CM

## 2018-08-28 DIAGNOSIS — R69 Illness, unspecified: Secondary | ICD-10-CM | POA: Diagnosis not present

## 2018-08-28 DIAGNOSIS — E440 Moderate protein-calorie malnutrition: Secondary | ICD-10-CM | POA: Diagnosis not present

## 2018-08-28 DIAGNOSIS — Z681 Body mass index (BMI) 19 or less, adult: Secondary | ICD-10-CM

## 2018-08-28 DIAGNOSIS — R6889 Other general symptoms and signs: Secondary | ICD-10-CM | POA: Diagnosis not present

## 2018-08-28 DIAGNOSIS — E782 Mixed hyperlipidemia: Secondary | ICD-10-CM | POA: Diagnosis not present

## 2018-08-28 LAB — LIPID PANEL

## 2018-08-28 NOTE — Progress Notes (Signed)
Subjective: CC: Wants labs PCP: [name], DO VEH:[facility] is a 68 y.o. female presenting to clinic today for:  1.  Osteoporosis/ malnourishment Patient reports that she is strongly considering Prolia for osteoporosis.  She sees a naturopath who she feels does not really want her to use this medicine but she is afraid of fracturing bones.  Diet is limited secondary to multiple allergies.  Much of her diet is modified by her naturopathic physician.  She is considering starting the Mediterranean diet.  She continues to have issues with IBS and had a tele-visit recently for diarrhea.  She notes that this is since resolved and she is now back to constipated which is her baseline.  She is considering resuming use of MiraLAX.  She would also like to update her DEXA  2.  Hyperlipidemia Patient not on any prescribed medications for cholesterol.  She takes multiple supplements including DHEA.  No chest pain, shortness of breath.  She continues to have issues with her sinuses and is seeing an ear nose and throat doctor for this.  She has recently switched from Claritin to Zyrtec.  She wants to have allergy testing with her naturopathic physician soon.   ROS: Per HPI  Allergies  Allergen Reactions  . Cefuroxime Axetil Other (See Comments)    Extreme gas  . Gabapentin Other (See Comments)    Constipation Constipation  . Pregabalin Other (See Comments)    Drowsiness, dry mouth Drowsiness, dry mouth Makes her tired and thirsty  . Augmentin [Amoxicillin-Pot Clavulanate] Diarrhea  . Avelox [Moxifloxacin Hcl In Nacl] Other (See Comments)    Tremors   . Biaxin [Clarithromycin]     Unsure of reaction  . Cedax [Ceftibuten] Other (See Comments)    tremors  . Ciprofloxacin Other (See Comments) and Nausea And Vomiting    Blurred vision Blurred vision Pt can't remember reaction  . Clindamycin/Lincomycin     tachycardia  . Levofloxacin Other (See Comments)    Feels dehydrated  .  Lincomycin Other (See Comments)    tachycardia  . Montelukast Sodium Other (See Comments)    Numbness and tingling in hand. Numbness and tingling in hand.  . Sulfonamide Derivatives Other (See Comments)    Gi upset   Past Medical History:  Diagnosis Date  . Acid reflux   . Allergy   . Arthritis    ARTHRITIS IN NECK BY DR. Alroy Dust [facility]  . Asthma   . Interstitial cystitis   . Osteoporosis   . PONV (postoperative nausea and vomiting)   . Tinnitus   . Trigeminal neuralgia    Atypical trigeminal neuralgia    Current Outpatient Medications:  .  acetaminophen (TYLENOL) 500 MG tablet, Take by mouth., Disp: , Rfl:  .  albuterol (PROVENTIL HFA;VENTOLIN HFA) 108 (90 Base) MCG/ACT inhaler, Inhale 2 puffs into the lungs every 4 (four) hours as needed for wheezing or shortness of breath., Disp: 18 g, Rfl: 1 .  ARNUITY ELLIPTA 200 MCG/ACT AEPB, TAKE 1 PUFF BY MOUTH EVERY DAY, Disp: 30 each, Rfl: 2 .  azelastine (ASTELIN) 0.1 % nasal spray, PLACE 2 SPRAYS INTO BOTH NOSTRILS 2 (TWO) TIMES DAILY. USE IN EACH NOSTRIL AS DIRECTED, Disp: 30 mL, Rfl: 2 .  b complex vitamins capsule, Take 1 capsule by mouth daily., Disp: , Rfl:  .  bisacodyl (DULCOLAX) 5 MG EC tablet, Take 5 mg by mouth daily as needed for moderate constipation., Disp: , Rfl:  .  calcium citrate-vitamin D (CITRACAL+D) 315-200  MG-UNIT tablet, Take 2 tablets by mouth 2 (two) times daily., Disp: , Rfl:  .  dextromethorphan-guaiFENesin (MUCINEX DM) 30-600 MG 12hr tablet, Take 1 tablet by mouth 2 (two) times daily., Disp: , Rfl:  .  Ergocalciferol (VITAMIN D2) 2000 units TABS, Take 2,000 Units by mouth daily., Disp: , Rfl:  .  lidocaine (XYLOCAINE) 2 % jelly, Apply topically as needed., Disp: 30 mL, Rfl: 3 .  lidocaine (XYLOCAINE) 5 % ointment, Apply daily as needed, Disp: , Rfl:  .  Methylcellulose, Laxative, (CITRUCEL PO), Take by mouth., Disp: , Rfl:  .  mometasone (ELOCON) 0.1 % ointment, 1 application to skin lesions twice daily until  resolved., Disp: 45 g, Rfl: 0 .  mometasone (NASONEX) 50 MCG/ACT nasal spray, PLACE 2 SPRAYS INTO THE NOSE DAILY., Disp: 17 g, Rfl: 3 .  ondansetron (ZOFRAN) 4 MG tablet, Take 1 tablet (4 mg total) by mouth every 8 (eight) hours as needed for nausea or vomiting., Disp: 20 tablet, Rfl: 0 .  pantoprazole (PROTONIX) 40 MG tablet, Take 1 tablet (40 mg total) by mouth daily., Disp: 30 tablet, Rfl: 5 .  phenylephrine (SUDAFED PE) 10 MG TABS tablet, Take 10 mg by mouth every 4 (four) hours as needed., Disp: , Rfl:  .  Polyethylene Glycol 3350 (MIRALAX PO), Take by mouth as needed., Disp: , Rfl:  .  Probiotic Product (PROBIOTIC PO), Take by mouth daily., Disp: , Rfl:  .  Simethicone (GAS-X PO), Take by mouth., Disp: , Rfl:  .  tizanidine (ZANAFLEX) 2 MG capsule, Take 1-2 capsules (2-4 mg total) by mouth 3 (three) times daily as needed for muscle spasms., Disp: 60 capsule, Rfl: 2 .  traMADol (ULTRAM) 50 MG tablet, Take 1 tablet (50 mg total) by mouth every 12 (twelve) hours as needed., Disp: 30 tablet, Rfl: 1 .  UNABLE TO FIND, Take 1 capsule by mouth daily. Dv3- vitamin D 3 plus immune support, Disp: , Rfl:  Social History   Socioeconomic History  . Marital status: Married    Spouse name: Not on file  . Number of children: 3  . Years of education: 40  . Highest education level: Some college, no degree  Occupational History  . Occupation: CNA    Comment: Retired  Scientific laboratory technician  . Financial resource strain: Not on file  . Food insecurity:    Worry: Never true    Inability: Never true  . Transportation needs:    Medical: No    Non-medical: No  Tobacco Use  . Smoking status: Never Smoker  . Smokeless tobacco: Never Used  Substance and Sexual Activity  . Alcohol use: No    Alcohol/week: 0.0 standard drinks    Comment: 01-19-2016 per pt no  . Drug use: No    Comment: 01-19-2016 per pt no   . Sexual activity: Never  Lifestyle  . Physical activity:    Days per week: 0 days    Minutes per  session: 0 min  . Stress: Not at all  Relationships  . Social connections:    Talks on phone: More than three times a week    Gets together: More than three times a week    Attends religious service: Never    Active member of club or organization: No    Attends meetings of clubs or organizations: Never    Relationship status: Married  . Intimate partner violence:    Fear of current or ex partner: No    Emotionally abused: No  Physically abused: No    Forced sexual activity: No  Other Topics Concern  . Not on file  Social History Narrative   Lives with husband.    Family History  Problem Relation Age of Onset  . Hyperlipidemia Mother   . Arthritis Mother   . Cancer Mother   . Lymphoma Mother   . Cancer Father   . Allergies Father   . Allergies Sister   . Allergies Sister   . Allergic rhinitis Neg Hx   . Angioedema Neg Hx   . Asthma Neg Hx   . Eczema Neg Hx   . Immunodeficiency Neg Hx   . Urticaria Neg Hx     Objective: Office vital signs reviewed. BP 99/64   Pulse 74   Temp (!) 97.4 F (36.3 C) (Oral)   Ht _0  (1.753 m)   Wt 107 lb (48.5 kg)   BMI 15.80 kg/m   Physical Examination:  General: Awake, alert, malnourished, No acute distress HEENT: Normal, sclera white, mild conjunctival pallor Cardio: regular rate and rhythm, S1S2 heard, no murmurs appreciated Pulm: clear to auscultation bilaterally, no wheezes, rhonchi or rales; normal work of breathing on room air MSK: Ambulates independently.  Low muscle mass  Assessment/ Plan: 68 y.o. female   1. Mixed hyperlipidemia Check fasting lipid panel, liver function test - CMP14+EGFR - Lipid Panel  2. Age-related osteoporosis without current pathological fracture Plan for DEXA - DG [facility] DEXA  3. Malnutrition of moderate degree (HCC) Weight has maintained but BMI continues to be quite low.  We will check metabolic labs and update DEXA.  Reemphasized need for high-protein and increased calorie diet.  I  have given her handout on Mediterranean diet.  She is to continue to follow-up with [facility] for anxiety management. - CMP14+EGFR - CBC - TSH - VITAMIN D 25 Hydroxy (Vit-D Deficiency, Fractures) - Ferritin - DG [facility] DEXA  4. Adult BMI <19 kg/sq m - CBC - TSH - VITAMIN D 25 Hydroxy (Vit-D Deficiency, Fractures) - Ferritin - DG [facility] DEXA   Orders Placed This Encounter  Procedures  . CMP14+EGFR  . CBC  . TSH  . VITAMIN D 25 Hydroxy (Vit-D Deficiency, Fractures)  . Lipid Panel   No orders of the defined types were placed in this encounter.    [name], DO [place] [phone]

## 2018-08-28 NOTE — Patient Instructions (Addendum)
You had labs performed today.  You will be contacted with the results of the labs once they are available, usually in the next 3 business days for routine lab work.   Mediterranean Diet A Mediterranean diet refers to food and lifestyle choices that are based on the traditions of countries located on the [employer]. This way of eating has been shown to help prevent certain conditions and improve outcomes for people who have chronic diseases, like kidney disease and heart disease. What are tips for following this plan? Lifestyle  Cook and eat meals together with your family, when possible.  Drink enough fluid to keep your urine clear or pale yellow.  Be physically active every day. This includes: ? Aerobic exercise like running or swimming. ? Leisure activities like gardening, walking, or housework.  Get 7-8 hours of sleep each night.  If recommended by your health care provider, drink red wine in moderation. This means 1 glass a day for nonpregnant women and 2 glasses a day for men. A glass of wine equals 5 oz (150 mL). Reading food labels   Check the serving size of packaged foods. For foods such as rice and pasta, the serving size refers to the amount of cooked product, not dry.  Check the total fat in packaged foods. Avoid foods that have saturated fat or trans fats.  Check the ingredients list for added sugars, such as corn syrup. Shopping  At the grocery store, buy most of your food from the areas near the walls of the store. This includes: ? Fresh fruits and vegetables (produce). ? Grains, beans, nuts, and seeds. Some of these may be available in unpackaged forms or large amounts (in bulk). ? Fresh seafood. ? Poultry and eggs. ? Low-fat dairy products.  Buy whole ingredients instead of prepackaged foods.  Buy fresh fruits and vegetables in-season from local farmers markets.  Buy frozen fruits and vegetables in resealable bags.  If you do not have access to quality  fresh seafood, buy precooked frozen shrimp or canned fish, such as tuna, salmon, or sardines.  Buy small amounts of raw or cooked vegetables, salads, or olives from the deli or salad bar at your store.  Stock your pantry so you always have certain foods on hand, such as olive oil, canned tuna, canned tomatoes, rice, pasta, and beans. Cooking  Cook foods with extra-virgin olive oil instead of using butter or other vegetable oils.  Have meat as a side dish, and have vegetables or grains as your main dish. This means having meat in small portions or adding small amounts of meat to foods like pasta or stew.  Use beans or vegetables instead of meat in common dishes like chili or lasagna.  Experiment with different cooking methods. Try roasting or broiling vegetables instead of steaming or sauteing them.  Add frozen vegetables to soups, stews, pasta, or rice.  Add nuts or seeds for added healthy fat at each meal. You can add these to yogurt, salads, or vegetable dishes.  Marinate fish or vegetables using olive oil, lemon juice, garlic, and fresh herbs. Meal planning   Plan to eat 1 vegetarian meal one day each week. Try to work up to 2 vegetarian meals, if possible.  Eat seafood 2 or more times a week.  Have healthy snacks readily available, such as: ? Vegetable sticks with hummus. ? [place] yogurt. ? Fruit and nut trail mix.  Eat balanced meals throughout the week. This includes: ? Fruit: 2-3 servings a day ?  Vegetables: 4-5 servings a day ? Low-fat dairy: 2 servings a day ? Fish, poultry, or lean meat: 1 serving a day ? Beans and legumes: 2 or more servings a week ? Nuts and seeds: 1-2 servings a day ? Whole grains: 6-8 servings a day ? Extra-virgin olive oil: 3-4 servings a day  Limit red meat and sweets to only a few servings a month What are my food choices?  Mediterranean diet ? Recommended ? Grains: Whole-grain pasta. Brown rice. Bulgar wheat. Polenta. Couscous.  Whole-wheat bread. [place]. ? Vegetables: Artichokes. Beets. Broccoli. Cabbage. Carrots. Eggplant. Green beans. Chard. Kale. Spinach. Onions. Leeks. Peas. Squash. Tomatoes. Peppers. Radishes. ? Fruits: Apples. Apricots. Avocado. Berries. Bananas. Cherries. Dates. Figs. Grapes. Lemons. Melon. Oranges. Peaches. Plums. Pomegranate. ? Meats and other protein foods: Beans. Almonds. Sunflower seeds. Pine nuts. Peanuts. [place]. Salmon. Scallops. Shrimp. [place]. Tilapia. Clams. Oysters. Eggs. ? Dairy: Low-fat milk. Cheese. Greek yogurt. ? Beverages: Water. Red wine. Herbal tea. ? Fats and oils: Extra virgin olive oil. Avocado oil. Grape seed oil. ? Sweets and desserts: [place] yogurt with honey. Baked apples. Poached pears. Trail mix. ? Seasoning and other foods: Basil. Cilantro. Coriander. Cumin. Mint. Parsley. Sage. Rosemary. Tarragon. Garlic. Oregano. Thyme. Pepper. Balsalmic vinegar. Tahini. Hummus. Tomato sauce. Olives. Mushrooms. ? Limit these ? Grains: Prepackaged pasta or rice dishes. Prepackaged cereal with added sugar. ? Vegetables: Deep fried potatoes (french fries). ? Fruits: Fruit canned in syrup. ? Meats and other protein foods: Beef. Pork. Lamb. Poultry with skin. Hot dogs. [place] Salines. ? Dairy: Ice cream. Sour cream. Whole milk. ? Beverages: Juice. Sugar-sweetened soft drinks. Beer. Liquor and spirits. ? Fats and oils: Butter. Canola oil. Vegetable oil. Beef fat (tallow). Lard. ? Sweets and desserts: Cookies. Cakes. Pies. Candy. ? Seasoning and other foods: Mayonnaise. Premade sauces and marinades. ? The items listed may not be a complete list. Talk with your dietitian about what dietary choices are right for you. Summary  The Mediterranean diet includes both food and lifestyle choices.  Eat a variety of fresh fruits and vegetables, beans, nuts, seeds, and whole grains.  Limit the amount of red meat and sweets that you eat.  Talk with your health care provider about whether it is safe  for you to drink red wine in moderation. This means 1 glass a day for nonpregnant women and 2 glasses a day for men. A glass of wine equals 5 oz (150 mL). This information is not intended to replace advice given to you by your health care provider. Make sure you discuss any questions you have with your health care provider. Document Released: 11/05/2015 Document Revised: 12/08/2015 Document Reviewed: 11/05/2015 Elsevier Interactive Patient Education  2019 [employer].

## 2018-08-29 LAB — CBC
Hematocrit: 36 % (ref 34.0–46.6)
Hemoglobin: 11.9 g/dL (ref 11.1–15.9)
MCH: 29.9 pg (ref 26.6–33.0)
MCHC: 33.1 g/dL (ref 31.5–35.7)
MCV: 91 fL (ref 79–97)
Platelets: 212 10*3/uL (ref 150–450)
RBC: 3.98 x10E6/uL (ref 3.77–5.28)
RDW: 11.6 % — ABNORMAL LOW (ref 11.7–15.4)
WBC: 4 10*3/uL (ref 3.4–10.8)

## 2018-08-29 LAB — VITAMIN D 25 HYDROXY (VIT D DEFICIENCY, FRACTURES): Vit D, 25-Hydroxy: 58.3 ng/mL (ref 30.0–100.0)

## 2018-08-29 LAB — CMP14+EGFR
ALT: 25 IU/L (ref 0–32)
AST: 31 IU/L (ref 0–40)
Albumin/Globulin Ratio: 2.2 (ref 1.2–2.2)
Albumin: 4.7 g/dL (ref 3.8–4.8)
Alkaline Phosphatase: 76 IU/L (ref 39–117)
BUN/Creatinine Ratio: 15 (ref 12–28)
BUN: 13 mg/dL (ref 8–27)
Bilirubin Total: 0.6 mg/dL (ref 0.0–1.2)
CO2: 24 mmol/L (ref 20–29)
Calcium: 10.3 mg/dL (ref 8.7–10.3)
Chloride: 100 mmol/L (ref 96–106)
Creatinine, Ser: 0.88 mg/dL (ref 0.57–1.00)
GFR calc Af Amer: 79 mL/min/{1.73_m2} (ref >59)
GFR calc non Af Amer: 68 mL/min/{1.73_m2} (ref >59)
Globulin, Total: 2.1 g/dL (ref 1.5–4.5)
Glucose: 98 mg/dL (ref 65–99)
Potassium: 4.6 mmol/L (ref 3.5–5.2)
Sodium: 139 mmol/L (ref 134–144)
Total Protein: 6.8 g/dL (ref 6.0–8.5)

## 2018-08-29 LAB — LIPID PANEL
Chol/HDL Ratio: 2.8 ratio (ref 0.0–4.4)
Cholesterol, Total: 183 mg/dL (ref 100–199)
HDL: 65 mg/dL (ref >39)
LDL Calculated: 109 mg/dL — ABNORMAL HIGH (ref 0–99)
Triglycerides: 45 mg/dL (ref 0–149)
VLDL Cholesterol Cal: 9 mg/dL (ref 5–40)

## 2018-08-29 LAB — TSH: TSH: 0.483 u[IU]/mL (ref 0.450–4.500)

## 2018-08-29 LAB — FERRITIN: Ferritin: 102 ng/mL (ref 15–150)

## 2018-08-30 ENCOUNTER — Ambulatory Visit (INDEPENDENT_AMBULATORY_CARE_PROVIDER_SITE_OTHER): Payer: Medicare HMO | Admitting: Licensed Clinical Social Worker

## 2018-08-30 DIAGNOSIS — E440 Moderate protein-calorie malnutrition: Secondary | ICD-10-CM

## 2018-08-30 DIAGNOSIS — R519 Headache, unspecified: Secondary | ICD-10-CM

## 2018-08-30 DIAGNOSIS — F411 Generalized anxiety disorder: Secondary | ICD-10-CM

## 2018-08-30 DIAGNOSIS — D649 Anemia, unspecified: Secondary | ICD-10-CM

## 2018-08-30 DIAGNOSIS — K219 Gastro-esophageal reflux disease without esophagitis: Secondary | ICD-10-CM

## 2018-08-30 NOTE — Patient Instructions (Signed)
Licensed Clinical Social Worker Visit Information  Goals we discussed today:  Goals Addressed            This Visit's Progress   . Client said she wants to talk with someone about her health issues and how she deals with them (pt-stated)       Current Barriers:  . Limited access to food . Mental Health Concerns   . Pain issues  Clinical Social Work Clinical Goal(s):  [name] Over the next 30 day days, client will work with LCSW to address concerns related to health issues of client and how she deals with health issues  Interventions: . Encouraged client to talk with RN CM about nursing needs of client . Provided counseling support for client . Talked with client about health issues of concern   Patient Self Care Activities:  . Attends all scheduled provider appointments  . Self administers prescribed medications . Calls provider office for new concerns or questions  Plan: Patient to contact LCSW to discuss psychosocial needs of cllient LCSW will call client in 3 weeks to discuss health needs of client  and client management of health needs Client to attend scheduled medical appointments Client to communicate with RN CM to discuss nursing needs of client  *Initial goal documentation       Materials Provided: No  Follow Up Plan: LCSW to call client in next 3 weeks to discuss health needs of client and client management of health needs  The patient verbalized understanding of instructions provided today and declined a print copy of patient instruction materials.   [name].Samhita Kretsch MSW, LCSW Licensed Clinical Social Worker [facility] [phone]

## 2018-08-30 NOTE — Chronic Care Management (AMB) (Signed)
  Care Management Note   Kristy Garza is a 68 y.o. year old female who is a primary care patient of [name], DO. The CM team was consulted for assistance with chronic disease management and care coordination.   I reached out to [name] by phone today.   Review of patient status, including review of consultants reports, relevant laboratory and other test results, and collaboration with appropriate care team members and the patient's provider was performed as part of comprehensive patient evaluation and provision of chronic care management services.   Social Determinants of Health:may have limited access to food occasionally. Risk for malnutrition    Office Visit from 08/28/2018 in [place]  PHQ-9 Total Score  0      Goals Addressed            This Visit's Progress   . Client said she wants to talk with someone about her health issues and how she deals with them (pt-stated)       Current Barriers:  . Limited access to food . Mental Health Concerns   . Pain issues  Clinical Social Work Clinical Goal(s):  [name] Over the next 30 day days, client will work with LCSW to address concerns related to health issues of client and how she deals with health issues  Interventions: . Encouraged client to talk with RN CM about nursing needs of client . Provided counseling support for client . Talked with client about health issues of concern   Patient Self Care Activities:  . Attends all scheduled provider appointments  . Self administers prescribed medications . Calls provider office for new concerns or questions  Plan: Patient to contact LCSW to discuss psychosocial needs of cllient LCSW will call client in 3 weeks to discuss health needs of client  and client management of health needs Client to attend scheduled medical appointments Client to communicate with RN CM to discuss nursing needs of client  *Initial goal documentation       Client has prescribed medications and is taking medications as prescribed. Client spoke of use of Mediterranean diet.  She said she knows the foods for her to avoid.  She said she is sleeping well.  She spoke of occasional tooth pain . She said she takes medications as prescribed.  She said she uses a mouth guard when she sleeps and feels that this is helpful. She has support from her daughters. Client said she is concerned about osteoporosis and client has talked with Dr.Gottschalk about osteoporosis for client. Client likes to take walks outdoors to relax. She likes listening to music or watching TV to help her relax. She said she has access to food needed but she has to watch types of food she eats.LCSW encouraged client to talk with RNCM to discuss nursing needs of client.  LCSW also encouraged client to use relaxation techniques of choice to help her manage anxiety symptoms faced  Follow Up Plan: LCSW to call client in next 3 weeks to discuss health needs of client and client management of health needs  [name].Jaymere Alen MSW, LCSW Licensed Clinical Social Worker [facility] [phone]

## 2018-09-10 ENCOUNTER — Ambulatory Visit (INDEPENDENT_AMBULATORY_CARE_PROVIDER_SITE_OTHER): Payer: Medicare HMO | Admitting: *Deleted

## 2018-09-10 ENCOUNTER — Other Ambulatory Visit: Payer: Self-pay

## 2018-09-10 DIAGNOSIS — Z0000 Encounter for general adult medical examination without abnormal findings: Secondary | ICD-10-CM | POA: Diagnosis not present

## 2018-09-10 NOTE — Progress Notes (Signed)
MEDICARE ANNUAL WELLNESS VISIT  09/10/2018  Telephone Visit Disclaimer This Medicare AWV was conducted by telephone due to national recommendations for restrictions regarding the COVID-19 Pandemic (e.g. social distancing).  I verified, using two identifiers, that I am speaking with Kristy Garza Kitchen or their authorized healthcare agent. I discussed the limitations, risks, security, and privacy concerns of performing an evaluation and management service by telephone and the potential availability of an in-person appointment in the future. The patient expressed understanding and agreed to proceed.   Subjective:  Kristy Garza is a 68 y.o. female patient of Kristy Norlander, DO who had a Medicare Annual Wellness Visit today via telephone. Kristy Garza is Retired and lives with their spouse. she has 3 children. she reports that she is socially active and does interact with friends/family regularly. she is not physically active and enjoys reading, cooking and playing with her grandchildren.  Patient Care Team: Kristy Norlander, DO as PCP - General (Family Medicine) Deneise Lever, MD as Consulting Physician (Pulmonary Disease) Minus Breeding, MD as Consulting Physician (Cardiology) Shea Evans, [name], LCSW as Social Worker (Licensed Clinical Social Worker) Ilean China, RN as Case Manager  Advanced Directives 09/10/2018 09/07/2017 08/29/2017 07/28/2015 02/24/2015 07/11/2014 05/07/2014  Does Patient Have a Medical Advance Directive? No No No No No No No  Would patient like information on creating a medical advance directive? No - Patient declined Yes (MAU/Ambulatory/Procedural Areas - Information given) - No - patient declined information Yes Higher education careers adviser given Yes - Scientist, clinical (histocompatibility and immunogenetics) given No - patient declined information  Pre-existing out of facility DNR order (yellow form or pink MOST form) - - - - - - -    Hospital Utilization Over the Past 12 Months: # of hospitalizations  or ER visits: 0 # of surgeries: 0  Review of Systems    Patient reports that her overall health is worse compared to last year.  Patient Reported Readings (BP, Pulse, CBG, Weight, etc) none  Review of Systems: No complaints  All other systems negative.  Pain Assessment Pain : No/denies pain     Current Medications & Allergies (verified) Allergies as of 09/10/2018      Reactions   Cefuroxime Axetil Other (See Comments)   Extreme gas   Gabapentin Other (See Comments)   Constipation Constipation   Pregabalin Other (See Comments)   Drowsiness, dry mouth Drowsiness, dry mouth Makes her tired and thirsty   Augmentin [amoxicillin-pot Clavulanate] Diarrhea   Avelox [moxifloxacin Hcl In Nacl] Other (See Comments)   Tremors   Biaxin [clarithromycin]    Unsure of reaction   Cedax [ceftibuten] Other (See Comments)   tremors   Ciprofloxacin Other (See Comments), Nausea And Vomiting   Blurred vision Blurred vision Pt can't remember reaction   Clindamycin/lincomycin    tachycardia   Levofloxacin Other (See Comments)   Feels dehydrated   Lincomycin Other (See Comments)   tachycardia   Montelukast Sodium Other (See Comments)   Numbness and tingling in hand. Numbness and tingling in hand.   Sulfonamide Derivatives Other (See Comments)   Gi upset      Medication List       Accurate as of September 10, 2018 10:56 AM. If you have any questions, ask your nurse or doctor.        STOP taking these medications   bisacodyl 5 MG EC tablet Commonly known as: DULCOLAX   CITRUCEL PO   lidocaine 5 % ointment Commonly known as: XYLOCAINE  mometasone 0.1 % ointment Commonly known as: ELOCON   ondansetron 4 MG tablet Commonly known as: ZOFRAN     TAKE these medications   acetaminophen 500 MG tablet Commonly known as: TYLENOL Take by mouth.   albuterol 108 (90 Base) MCG/ACT inhaler Commonly known as: VENTOLIN HFA Inhale 2 puffs into the lungs every 4 (four) hours as  needed for wheezing or shortness of breath.   Arnuity Ellipta 200 MCG/ACT Aepb Generic drug: Fluticasone Furoate TAKE 1 PUFF BY MOUTH EVERY DAY   azelastine 0.1 % nasal spray Commonly known as: ASTELIN PLACE 2 SPRAYS INTO BOTH NOSTRILS 2 (TWO) TIMES DAILY. USE IN EACH NOSTRIL AS DIRECTED   b complex vitamins capsule Take 1 capsule by mouth daily.   calcium citrate-vitamin D 315-200 MG-UNIT tablet Commonly known as: CITRACAL+D Take 2 tablets by mouth 2 (two) times daily.   cetirizine HCl 5 MG/5ML Soln Commonly known as: Zyrtec Take 5 mg by mouth daily.   dextromethorphan-guaiFENesin 30-600 MG 12hr tablet Commonly known as: MUCINEX DM Take 1 tablet by mouth 2 (two) times daily.   GAS-X PO Take by mouth.   hyoscyamine 0.125 MG SL tablet Commonly known as: LEVSIN SL PLEASE SEE ATTACHED FOR DETAILED DIRECTIONS   lidocaine 2 % jelly Commonly known as: XYLOCAINE Apply topically as needed.   MIRALAX PO Take by mouth as needed.   mometasone 50 MCG/ACT nasal spray Commonly known as: NASONEX PLACE 2 SPRAYS INTO THE NOSE DAILY.   pantoprazole 40 MG tablet Commonly known as: PROTONIX Take 1 tablet (40 mg total) by mouth daily.   phenylephrine 10 MG Tabs tablet Commonly known as: SUDAFED PE Take 10 mg by mouth every 4 (four) hours as needed.   Prasterone (DHEA) 25 MG Caps Take 2 mg by mouth daily.   PROBIOTIC PO Take by mouth daily.   tizanidine 2 MG capsule Commonly known as: ZANAFLEX Take 1-2 capsules (2-4 mg total) by mouth 3 (three) times daily as needed for muscle spasms.   traMADol 50 MG tablet Commonly known as: ULTRAM Take 1 tablet (50 mg total) by mouth every 12 (twelve) hours as needed.   UNABLE TO FIND Take 1 capsule by mouth daily. Dv3- vitamin D 3 plus immune support   Vitamin D2 50 MCG (2000 UT) Tabs Take 2,000 Units by mouth daily.       History (reviewed): Past Medical History:  Diagnosis Date   Acid reflux    Allergy    Arthritis      ARTHRITIS IN NECK BY DR. Alroy Dust [facility]   Asthma    Interstitial cystitis    Osteoporosis    PONV (postoperative nausea and vomiting)    Tinnitus    Trigeminal neuralgia    Atypical trigeminal neuralgia   Past Surgical History:  Procedure Laterality Date   ABDOMINAL HYSTERECTOMY  2000   CYSTOSCOPY WITH HYDRODISTENSION AND BIOPSY N/A 06/11/2012   Procedure: CYSTOSCOPY/BIOPSY/HYDRODISTENSION with Instillation of Pyridium and Marcaine and Kenalog;  Surgeon: Ailene Rud, MD;  [facility];  Service: Urology;  Laterality: N/A;  1 hour requested for this case  BLADDER BIOPSY   ETHMOIDECTOMY  2012   SEPTOPLASTY  1980's   vocal cord surgery   1990's   polyp removal   Family History  Problem Relation Age of Onset   Hyperlipidemia Mother    Arthritis Mother    Cancer Mother    Lymphoma Mother    Cancer Father    Allergies Father  Allergies Sister    Allergies Sister    Allergic rhinitis Neg Hx    Angioedema Neg Hx    Asthma Neg Hx    Eczema Neg Hx    Immunodeficiency Neg Hx    Urticaria Neg Hx    Social History   Socioeconomic History   Marital status: Married    Spouse name: [name]   Number of children: 3   Years of education: 12   Highest education level: Some college, no degree  Occupational History   Occupation: CNA    Comment: Retired  Scientist, product/process development strain: Not hard at [employer] insecurity    Worry: Never true    Inability: Never true   Transportation needs    Medical: No    Non-medical: No  Tobacco Use   Smoking status: Never Smoker   Smokeless tobacco: Never Used  Substance and Sexual Activity   Alcohol use: No    Alcohol/week: 0.0 standard drinks    Comment: 01-19-2016 per pt no   Drug use: No    Comment: 01-19-2016 per pt no    Sexual activity: Not Currently    Birth control/protection: Surgical  Lifestyle   Physical activity    Days per week: 0 days     Minutes per session: 0 min   Stress: Not at all  Relationships   Social connections    Talks on phone: More than three times a week    Gets together: More than three times a week    Attends religious service: Never    Active member of club or organization: No    Attends meetings of clubs or organizations: Never    Relationship status: Married  Other Topics Concern   Not on file  Social History Narrative   Lives with husband.     Activities of Daily Living In your present state of health, do you have any difficulty performing the following activities: 09/10/2018 05/25/2018  Hearing? N N  Vision? N N  Difficulty concentrating or making decisions? N N  Walking or climbing stairs? N N  Dressing or bathing? N N  Doing errands, shopping? N N  Preparing Food and eating ? N N  Using the Toilet? N N  In the past six months, have you accidently leaked urine? N N  Do you have problems with loss of bowel control? N N  Managing your Medications? N N  Managing your Finances? N N  Housekeeping or managing your Housekeeping? - Y  Some recent data might be hidden    Patient Literacy How often do you need to have someone help you when you read instructions, pamphlets, or other written materials from your doctor or pharmacy?: 1 - Never What is the last grade level you completed in school?: 12th grade  Exercise Current Exercise Habits: The patient does not participate in regular exercise at present, Exercise limited by: None identified  Diet Patient reports consuming 2 meals a day and 1 snack(s) a day Patient reports that her primary diet is: Regular Patient reports that she does have regular access to food.   Depression Screen PHQ 2/9 Scores 09/10/2018 08/28/2018 05/23/2018 04/13/2018 04/11/2018 02/15/2018 01/04/2018  PHQ - 2 Score 0 0 0 2 0 0 0  PHQ- 9 Score - 0 0 7 0 - -     Fall Risk Fall Risk  09/10/2018 08/28/2018 04/11/2018 02/15/2018 01/04/2018  Falls in the past year? 0 0 0 0 No  Objective:  Kristy Garza seemed alert and oriented and she participated appropriately during our telephone visit.  Blood Pressure Weight BMI  BP Readings from Last 3 Encounters:  08/28/18 99/64  05/23/18 116/74  04/13/18 105/69   Wt Readings from Last 3 Encounters:  08/28/18 107 lb (48.5 kg)  05/23/18 107 lb (48.5 kg)  04/13/18 110 lb (49.9 kg)   BMI Readings from Last 1 Encounters:  08/28/18 15.80 kg/m    *Unable to obtain current vital signs, weight, and BMI due to telephone visit type  Hearing/Vision   Amariyana did not seem to have difficulty with hearing/understanding during the telephone conversation  Reports that she has not had a formal eye exam by an eye care professional within the past year  Reports that she has not had a formal hearing evaluation within the past year *Unable to fully assess hearing and vision during telephone visit type  Cognitive Function: 6CIT Screen 09/10/2018  What Year? 0 points  What month? 0 points  What time? 0 points  Count back from 20 0 points  Months in reverse 0 points  Repeat phrase 0 points  Total Score 0   (Normal:0-7, Significant for Dysfunction: >8)  Normal Cognitive Function Screening: Yes   Immunization & Health Maintenance Record Immunization History  Administered Date(s) Administered   Influenza,inj,Quad PF,6+ Mos 12/28/2012, 12/25/2013, 12/26/2014   Pneumococcal Conjugate-13 02/07/2013   Pneumococcal Polysaccharide-23 09/07/2017   Tdap 05/21/2010   Zoster 03/27/2012    Health Maintenance  Topic Date Due   INFLUENZA VACCINE  10/27/2018   MAMMOGRAM  12/21/2018   DEXA SCAN  08/09/2019   TETANUS/TDAP  05/21/2020   COLONOSCOPY  06/28/2025   Hepatitis C Screening  Completed   PNA vac Low Risk Adult  Completed       Assessment  This is a routine wellness examination for Kristy Garza Kitchen.  Health Maintenance: Due or Overdue There are no preventive care reminders to display for this  patient.  Kristy Garza Kitchen does not need a referral for Community Assistance: Care Management:   no Social Work:    no Prescription Assistance:  no Nutrition/Diabetes Education:  no   Plan:  Personalized Goals Goals Addressed            This Visit's Progress    Patient Stated (pt-stated)       " I would like to improve my health and gain more energy"      Personalized Health Maintenance & Screening Recommendations  Advanced directives: has the packet and additional information but hasn't filled it out  Lung Cancer Screening Recommended: no (Low Dose CT Chest recommended if Age 28-80 years, 30 pack-year currently smoking OR have quit w/in past 15 years) Hepatitis C Screening recommended: no HIV Screening recommended: no  Advanced Directives: Written information was not prepared per patient's request.  Referrals & Orders No orders of the defined types were placed in this encounter.   Follow-up Plan  Follow-up with Kristy Norlander, DO as planned  Complete the Advanced Directives as discussed     I have personally reviewed and noted the following in the patients chart:    Medical and social history  Use of alcohol, tobacco or illicit drugs   Current medications and supplements  Functional ability and status  Nutritional status  Physical activity  Advanced directives  List of other physicians  Hospitalizations, surgeries, and ER visits in previous 12 months  Vitals  Screenings to include cognitive, depression, and falls  Referrals  and appointments  In addition, I have reviewed and discussed with Kristy Garza Kitchen certain preventive protocols, quality metrics, and best practice recommendations. A written personalized care plan for preventive services as well as general preventive health recommendations is available and can be mailed to the patient at her request.      Marylin Crosby, LPN  2/33/6122

## 2018-09-10 NOTE — Patient Instructions (Signed)
Preventive Care 42 Years and Older, Female Preventive care refers to lifestyle choices and visits with your health care provider that can promote health and wellness. What does preventive care include?  A yearly physical exam. This is also called an annual well check.  Dental exams once or twice a year.  Routine eye exams. Ask your health care provider how often you should have your eyes checked.  Personal lifestyle choices, including: ? Daily care of your teeth and gums. ? Regular physical activity. ? Eating a healthy diet. ? Avoiding tobacco and drug use. ? Limiting alcohol use. ? Practicing safe sex. ? Taking low-dose aspirin every day. ? Taking vitamin and mineral supplements as recommended by your health care provider. What happens during an annual well check? The services and screenings done by your health care provider during your annual well check will depend on your age, overall health, lifestyle risk factors, and family history of disease. Counseling Your health care provider may ask you questions about your:  Alcohol use.  Tobacco use.  Drug use.  Emotional well-being.  Home and relationship well-being.  Sexual activity.  Eating habits.  History of falls.  Memory and ability to understand (cognition).  Work and work Statistician.  Reproductive health.  Screening You may have the following tests or measurements:  Height, weight, and BMI.  Blood pressure.  Lipid and cholesterol levels. These may be checked every 5 years, or more frequently if you are over 30 years old.  Skin check.  Lung cancer screening. You may have this screening every year starting at age 27 if you have a 30-pack-year history of smoking and currently smoke or have quit within the past 15 years.  Colorectal cancer screening. All adults should have this screening starting at age 33 and continuing until age 46. You will have tests every 1-10 years, depending on your results and the  type of screening test. People at increased risk should start screening at an earlier age. Screening tests may include: ? Guaiac-based fecal occult blood testing. ? Fecal immunochemical test (FIT). ? Stool DNA test. ? Virtual colonoscopy. ? Sigmoidoscopy. During this test, a flexible tube with a tiny camera (sigmoidoscope) is used to examine your rectum and lower colon. The sigmoidoscope is inserted through your anus into your rectum and lower colon. ? Colonoscopy. During this test, a long, thin, flexible tube with a tiny camera (colonoscope) is used to examine your entire colon and rectum.  Hepatitis C blood test.  Hepatitis B blood test.  Sexually transmitted disease (STD) testing.  Diabetes screening. This is done by checking your blood sugar (glucose) after you have not eaten for a while (fasting). You may have this done every 1-3 years.  Bone density scan. This is done to screen for osteoporosis. You may have this done starting at age 37.  Mammogram. This may be done every 1-2 years. Talk to your health care provider about how often you should have regular mammograms. Talk with your health care provider about your test results, treatment options, and if necessary, the need for more tests. Vaccines Your health care provider may recommend certain vaccines, such as:  Influenza vaccine. This is recommended every year.  Tetanus, diphtheria, and acellular pertussis (Tdap, Td) vaccine. You may need a Td booster every 10 years.  Varicella vaccine. You may need this if you have not been vaccinated.  Zoster vaccine. You may need this after age 38.  Measles, mumps, and rubella (MMR) vaccine. You may need at least  one dose of MMR if you were born in 1957 or later. You may also need a second dose.  Pneumococcal 13-valent conjugate (PCV13) vaccine. One dose is recommended after age 24.  Pneumococcal polysaccharide (PPSV23) vaccine. One dose is recommended after age 24.  Meningococcal  vaccine. You may need this if you have certain conditions.  Hepatitis A vaccine. You may need this if you have certain conditions or if you travel or work in places where you may be exposed to hepatitis A.  Hepatitis B vaccine. You may need this if you have certain conditions or if you travel or work in places where you may be exposed to hepatitis B.  Haemophilus influenzae type b (Hib) vaccine. You may need this if you have certain conditions. Talk to your health care provider about which screenings and vaccines you need and how often you need them. This information is not intended to replace advice given to you by your health care provider. Make sure you discuss any questions you have with your health care provider. Document Released: 04/10/2015 Document Revised: 05/04/2017 Document Reviewed: 01/13/2015 Elsevier Interactive Patient Education  2019 [employer].

## 2018-09-12 ENCOUNTER — Other Ambulatory Visit: Payer: Self-pay

## 2018-09-12 ENCOUNTER — Ambulatory Visit (INDEPENDENT_AMBULATORY_CARE_PROVIDER_SITE_OTHER): Payer: Medicare HMO | Admitting: Family Medicine

## 2018-09-12 ENCOUNTER — Encounter: Payer: Self-pay | Admitting: Family Medicine

## 2018-09-12 DIAGNOSIS — J4530 Mild persistent asthma, uncomplicated: Secondary | ICD-10-CM

## 2018-09-12 MED ORDER — TRIAMCINOLONE ACETONIDE 0.1 % EX CREA: 1.0000 "application " | TOPICAL_CREAM | Freq: Two times a day (BID) | CUTANEOUS | 1 refills | Status: DC

## 2018-09-12 NOTE — Progress Notes (Signed)
Virtual Visit via telephone Note  I connected with Kristy Garza on 09/12/18 at 1523 by telephone and verified that I am speaking with the correct person using two identifiers. Kristy Garza is currently located at home and no other people are currently with her during visit. The provider, Fransisca Kaufmann , MD is located in their office at time of visit.  Call ended at 1530  I discussed the limitations, risks, security and privacy concerns of performing an evaluation and management service by telephone and the availability of in person appointments. I also discussed with the patient that there may be a patient responsible charge related to this service. The patient expressed understanding and agreed to proceed.   History and Present Illness: Patient is calling in for a rash of little red itchy bumps on arms legs head and back.  They are very pruitic.  She has been using cortisone 10.  The rash has been going on for 2 days.  The rash is spread over her body and somebody told her it might be a viral rash, she does not feel sick in any other way more than she normally is.  No diagnosis found.  Outpatient Encounter Medications as of 09/12/2018  Medication Sig  . acetaminophen (TYLENOL) 500 MG tablet Take by mouth.  [name] albuterol (PROVENTIL HFA;VENTOLIN HFA) 108 (90 Base) MCG/ACT inhaler Inhale 2 puffs into the lungs every 4 (four) hours as needed for wheezing or shortness of breath.  . ARNUITY ELLIPTA 200 MCG/ACT AEPB TAKE 1 PUFF BY MOUTH EVERY DAY  . azelastine (ASTELIN) 0.1 % nasal spray PLACE 2 SPRAYS INTO BOTH NOSTRILS 2 (TWO) TIMES DAILY. USE IN EACH NOSTRIL AS DIRECTED  . b complex vitamins capsule Take 1 capsule by mouth daily.  . calcium citrate-vitamin D (CITRACAL+D) 315-200 MG-UNIT tablet Take 2 tablets by mouth 2 (two) times daily.  . cetirizine HCl (ZYRTEC) 5 MG/5ML SOLN Take 5 mg by mouth daily.  [name] dextromethorphan-guaiFENesin (MUCINEX DM) 30-600 MG 12hr tablet Take 1 tablet by  mouth 2 (two) times daily.  . Ergocalciferol (VITAMIN D2) 2000 units TABS Take 2,000 Units by mouth daily.  . hyoscyamine (LEVSIN SL) 0.125 MG SL tablet PLEASE SEE ATTACHED FOR DETAILED DIRECTIONS  . lidocaine (XYLOCAINE) 2 % jelly Apply topically as needed.  . mometasone (NASONEX) 50 MCG/ACT nasal spray PLACE 2 SPRAYS INTO THE NOSE DAILY.  . pantoprazole (PROTONIX) 40 MG tablet Take 1 tablet (40 mg total) by mouth daily.  . phenylephrine (SUDAFED PE) 10 MG TABS tablet Take 10 mg by mouth every 4 (four) hours as needed.  . Polyethylene Glycol 3350 (MIRALAX PO) Take by mouth as needed.  . Prasterone, DHEA, 25 MG CAPS Take 2 mg by mouth daily.  . Probiotic Product (PROBIOTIC PO) Take by mouth daily.  . Simethicone (GAS-X PO) Take by mouth.  . tizanidine (ZANAFLEX) 2 MG capsule Take 1-2 capsules (2-4 mg total) by mouth 3 (three) times daily as needed for muscle spasms.  . traMADol (ULTRAM) 50 MG tablet Take 1 tablet (50 mg total) by mouth every 12 (twelve) hours as needed.  [name] UNABLE TO FIND Take 1 capsule by mouth daily. Dv3- vitamin D 3 plus immune support   No facility-administered encounter medications on file as of 09/12/2018.     Review of Systems  Constitutional: Negative for chills and fever.  HENT: Negative for congestion, ear discharge and ear pain.   Eyes: Negative for redness and visual disturbance.  Respiratory: Negative for chest  tightness and shortness of breath.   Cardiovascular: Negative for chest pain and leg swelling.  Genitourinary: Negative for difficulty urinating and dysuria.  Musculoskeletal: Negative for back pain and gait problem.  Skin: Positive for rash.  Neurological: Negative for light-headedness and headaches.  Psychiatric/Behavioral: Negative for agitation and behavioral problems.  All other systems reviewed and are negative.   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 The patient was provided an opportunity to ask questions and all were answered. The patient agreed with the plan and demonstrated an understanding of the instructions.   The patient was advised to call back or seek an in-person evaluation if the symptoms worsen or if the condition fails to improve as anticipated.  The above assessment and management plan was discussed with the patient. The patient verbalized understanding of and has agreed to the management plan. Patient is aware to call the clinic if symptoms persist or worsen. Patient is aware when to return to the clinic for a follow-up visit. Patient educated on when it is appropriate to go to the emergency department.    I provided 7 minutes of non-face-to-face time during this encounter.    Worthy Rancher, MD

## 2018-09-13 ENCOUNTER — Other Ambulatory Visit: Payer: Self-pay | Admitting: *Deleted

## 2018-09-13 MED ORDER — ARNUITY ELLIPTA 200 MCG/ACT IN AEPB: 1.0000 | INHALATION_SPRAY | Freq: Every day | RESPIRATORY_TRACT | 0 refills | Status: DC

## 2018-09-13 NOTE — Telephone Encounter (Signed)
Courtesy refill needs office visit

## 2018-09-15 ENCOUNTER — Telehealth: Payer: Self-pay | Admitting: Allergy

## 2018-09-15 NOTE — Telephone Encounter (Signed)
Patient has been breaking out in rash on the legs, arms, chest, buttocks since Tuesday. Has been taking zyrtec in the morning and benadryl at night with minimal benefit. She has been using triamcinolone cream as prescribed by PCP.  Wondering if she needs oral prednisone for this.  Denies any changes in diet, meds, personal care products or recent infections. She was helping her husband cover up hay when this started. Describes the rash a little bumps.  Advised patient to use the mometasone cream that she was prescribes last year twice a day instead of triamcinolone on the body. For the neck and face continue with the triamcinolone. Continue with zyrtec and benadryl.  Take pictures of the rash.  If not getting better by next week then make OV for further evaluation.

## 2018-09-19 ENCOUNTER — Ambulatory Visit: Payer: Self-pay | Admitting: Neurology

## 2018-09-20 ENCOUNTER — Ambulatory Visit: Payer: Self-pay | Admitting: Licensed Clinical Social Worker

## 2018-09-20 DIAGNOSIS — R519 Headache, unspecified: Secondary | ICD-10-CM

## 2018-09-20 DIAGNOSIS — K219 Gastro-esophageal reflux disease without esophagitis: Secondary | ICD-10-CM

## 2018-09-20 DIAGNOSIS — F411 Generalized anxiety disorder: Secondary | ICD-10-CM

## 2018-09-20 DIAGNOSIS — M26623 Arthralgia of bilateral temporomandibular joint: Secondary | ICD-10-CM

## 2018-09-20 DIAGNOSIS — D649 Anemia, unspecified: Secondary | ICD-10-CM

## 2018-09-20 DIAGNOSIS — E440 Moderate protein-calorie malnutrition: Secondary | ICD-10-CM

## 2018-09-20 NOTE — Chronic Care Management (AMB) (Signed)
  Chronic Care Management    Clinical Social Work CCM Outreach Note  09/20/2018 Name: Kristy Garza MRN: [id number] DOB: [birth year]  Kristy Garza is a 68 y.o. year old female who is a primary care patient of Kristy Norlander, DO . The CCM team was consulted for assistance with assessment of psychosocial needs.   LCSW reached out to Kristy Garza Kitchen today by phone      Social Determinants of Health: Physical Activity, Inactive; Somewhat socially isolated    Office Visit from 08/28/2018 in [place]  PHQ-9 Total Score  0       Goal:  Client said she wants to talk with someone about her health issues and how she deals with them (pt-stated)     Current Barriers:  . Limited access to food . Mental Health Concerns   . Pain issues  Clinical Social Work Clinical Goal(s):  Kristy Garza Kitchen Over the next 30 day days, client will work with LCSW to address concerns related to health issues of client and how she deals with health issues  Interventions: . Encouraged client to talk with RN CM about nursing needs of client . Provided counseling support for client . Talked with client about health issues of concern   Patient Self Care Activities:  . Attends all scheduled provider appointments  . Self administers prescribed medications . Calls provider office for new concerns or questions  Plan: Patient to contact LCSW to discuss psychosocial needs of cllient LCSW will call client in 3 weeks to discuss health needs of client  and client management of health needs Client to attend scheduled medical appointments Client to communicate with RN CM to discuss nursing needs of client  *Initial goal documentation    Client said she recently received a B12 shot from [facility] in [place], [place].  She said she had her prescribed medications and is taking medications as prescribed. She said rash has improved a good deal. Client has allergies of tree pollen, dust, mold  and mildew. 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  She said recent appointment with Dr. Lajuana Garza at [facility].  She said if she has active day she may be more fatigued the next day.  Client spoke of Gastritis.  She requested that [place] please mail her information on fatigue .  She has some difficulty sleeping and would also like information from [facility] about sleep hygiene.  Client also requested a copy of her most recent lab work at [facility] be mailed to her.  Follow Up Plan: LCSW to call client in next 3 weeks to discuss health needs of client and client management of health needs  Kristy Garza MSW, LCSW Licensed Clinical Social Worker [facility] Care Management [phone]

## 2018-09-20 NOTE — Patient Instructions (Addendum)
Licensed Clinical Social Worker Visit Information  Goals we discussed today:   Goals    . Client said she wants to talk with someone about her health issues and how she deals with them (pt-stated)     Current Barriers:  . Limited access to food . Mental Health Concerns   . Pain issues  Clinical Social Work Clinical Goal(s):  Kristy Garza Kitchen Over the next 30 day days, client will work with LCSW to address concerns related to health issues of client and how she deals with health issues  Interventions: . Encouraged client to talk with RN CM about nursing needs of client . Provided counseling support for client . Talked with client about health issues of concern . Talked with client about energy level of client and sleep issues of client   Patient Self Care Activities:  . Attends all scheduled provider appointments  . Self administers prescribed medications . Calls provider office for new concerns or questions  Plan: Patient to contact LCSW to discuss psychosocial needs of cllient LCSW will call client in 3 weeks to discuss health needs of client  and client management of health needs Client to attend scheduled medical appointments Client to communicate with RN CM to discuss nursing needs of client  *Initial goal documentation    .    Materials Provided: No  Client said she recently received a B12 shot from [facility] in [place], [place].  She said she had her prescribed medications and is taking medications as prescribed. She said rash has improved a good deal. Client has allergies of tree pollen, dust, mold and mildew. 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  She said recent appointment with Dr. [name] at [facility]  She said if she has active day she may be more fatigued the next day.  Client spoke of Gastritis.  She requested that [facility] please mail her information on fatigue .  She has some difficulty sleeping and would also like information from [facility]  about sleep hygiene.  Client also requested a copy of her most recent lab work at [facility] be mailed to her.  Follow Up Plan: LCSW to call client in next 3 weeks to talk with client about health needs of client and client management of health needs  The patient verbalized understanding of instructions provided today and declined a print copy of patient instruction materials.   Kristy Garza.Kristy Garza MSW, LCSW Licensed Clinical Social Worker [name] Care Management [phone]

## 2018-09-24 ENCOUNTER — Other Ambulatory Visit: Payer: Self-pay

## 2018-09-25 ENCOUNTER — Ambulatory Visit (INDEPENDENT_AMBULATORY_CARE_PROVIDER_SITE_OTHER): Payer: Medicare HMO

## 2018-09-25 DIAGNOSIS — M810 Age-related osteoporosis without current pathological fracture: Secondary | ICD-10-CM

## 2018-09-26 ENCOUNTER — Ambulatory Visit: Payer: Medicare HMO | Admitting: *Deleted

## 2018-09-26 DIAGNOSIS — R5383 Other fatigue: Secondary | ICD-10-CM

## 2018-09-26 DIAGNOSIS — F4322 Adjustment disorder with anxiety: Secondary | ICD-10-CM

## 2018-09-27 ENCOUNTER — Ambulatory Visit (INDEPENDENT_AMBULATORY_CARE_PROVIDER_SITE_OTHER): Payer: Medicare HMO | Admitting: Family Medicine

## 2018-09-27 ENCOUNTER — Other Ambulatory Visit: Payer: Self-pay | Admitting: Family Medicine

## 2018-09-27 ENCOUNTER — Encounter: Payer: Self-pay | Admitting: Family Medicine

## 2018-09-27 ENCOUNTER — Other Ambulatory Visit: Payer: Self-pay

## 2018-09-27 DIAGNOSIS — R399 Unspecified symptoms and signs involving the genitourinary system: Secondary | ICD-10-CM

## 2018-09-27 MED ORDER — FOSFOMYCIN TROMETHAMINE 3 G PO PACK: 3.0000 g | PACK | Freq: Once | ORAL | 0 refills | Status: AC

## 2018-09-27 NOTE — Progress Notes (Signed)
Virtual Visit via telephone Note  I connected with Kristy Garza on 09/27/18 at 1656 by telephone and verified that I am speaking with the correct person using two identifiers. Kristy Garza is currently located at home and no other people are currently with her during visit. The provider, Fransisca Kaufmann Dettinger, MD is located in their office at time of visit.  Call ended at 1702  I discussed the limitations, risks, security and privacy concerns of performing an evaluation and management service by telephone and the availability of in person appointments. I also discussed with the patient that there may be a patient responsible charge related to this service. The patient expressed understanding and agreed to proceed.   History and Present Illness: Patient had a bm in her sleep last night.  She says she has dysuria starting today.  She denies any fevers or chills or abdominal pain or fevers or chills.   No diagnosis found.  Outpatient Encounter Medications as of 09/27/2018  Medication Sig  . acetaminophen (TYLENOL) 500 MG tablet Take by mouth.  [name] albuterol (PROVENTIL HFA;VENTOLIN HFA) 108 (90 Base) MCG/ACT inhaler Inhale 2 puffs into the lungs every 4 (four) hours as needed for wheezing or shortness of breath.  [name] azelastine (ASTELIN) 0.1 % nasal spray PLACE 2 SPRAYS INTO BOTH NOSTRILS 2 (TWO) TIMES DAILY. USE IN EACH NOSTRIL AS DIRECTED  . b complex vitamins capsule Take 1 capsule by mouth daily.  . calcium citrate-vitamin D (CITRACAL+D) 315-200 MG-UNIT tablet Take 2 tablets by mouth 2 (two) times daily.  . cetirizine HCl (ZYRTEC) 5 MG/5ML SOLN Take 5 mg by mouth daily.  [name] dextromethorphan-guaiFENesin (MUCINEX DM) 30-600 MG 12hr tablet Take 1 tablet by mouth 2 (two) times daily.  . Ergocalciferol (VITAMIN D2) 2000 units TABS Take 2,000 Units by mouth daily.  . Fluticasone Furoate (ARNUITY ELLIPTA) 200 MCG/ACT AEPB Inhale 1 puff into the lungs daily.  . hyoscyamine (LEVSIN SL) 0.125 MG SL  tablet PLEASE SEE ATTACHED FOR DETAILED DIRECTIONS  . lidocaine (XYLOCAINE) 2 % jelly Apply topically as needed.  . mometasone (NASONEX) 50 MCG/ACT nasal spray PLACE 2 SPRAYS INTO THE NOSE DAILY.  . pantoprazole (PROTONIX) 40 MG tablet Take 1 tablet (40 mg total) by mouth daily.  . phenylephrine (SUDAFED PE) 10 MG TABS tablet Take 10 mg by mouth every 4 (four) hours as needed.  . Polyethylene Glycol 3350 (MIRALAX PO) Take by mouth as needed.  . Prasterone, DHEA, 25 MG CAPS Take 2 mg by mouth daily.  . Probiotic Product (PROBIOTIC PO) Take by mouth daily.  . Simethicone (GAS-X PO) Take by mouth.  . tizanidine (ZANAFLEX) 2 MG capsule Take 1-2 capsules (2-4 mg total) by mouth 3 (three) times daily as needed for muscle spasms.  . traMADol (ULTRAM) 50 MG tablet Take 1 tablet (50 mg total) by mouth every 12 (twelve) hours as needed.  . triamcinolone cream (KENALOG) 0.1 % Apply 1 application topically 2 (two) times daily.  [name] UNABLE TO FIND Take 1 capsule by mouth daily. Dv3- vitamin D 3 plus immune support   No facility-administered encounter medications on file as of 09/27/2018.     Review of Systems  Constitutional: Negative for chills and fever.  Respiratory: Negative for chest tightness and shortness of breath.   Cardiovascular: Negative for chest pain and leg swelling.  Gastrointestinal: Negative for abdominal pain.  Genitourinary: Positive for dysuria, frequency and urgency. Negative for decreased urine volume, difficulty urinating, hematuria, vaginal bleeding, vaginal discharge and  vaginal pain.  All other systems reviewed and are negative.   Observations/Objective: Patient sounds comfortable on the phone and in no acute distress  Assessment and Plan: Problem List Items Addressed This Visit    None    Visit Diagnoses    UTI symptoms    -  Primary   Relevant Medications   fosfomycin (MONUROL) 3 g PACK       Follow Up Instructions:  We will treat symptomatically, patient says  she is tolerated fosfomycin before and so we will send 1 pack of that for her.   I discussed the assessment and treatment plan with the patient. The patient was provided an opportunity to ask questions and all were answered. The patient agreed with the plan and demonstrated an understanding of the instructions.   The patient was advised to call back or seek an in-person evaluation if the symptoms worsen or if the condition fails to improve as anticipated.  The above assessment and management plan was discussed with the patient. The patient verbalized understanding of and has agreed to the management plan. Patient is aware to call the clinic if symptoms persist or worsen. Patient is aware when to return to the clinic for a follow-up visit. Patient educated on when it is appropriate to go to the emergency department.    I provided 6 minutes of non-face-to-face time during this encounter.    Worthy Rancher, MD

## 2018-09-28 ENCOUNTER — Telehealth: Payer: Self-pay | Admitting: Family Medicine

## 2018-09-28 ENCOUNTER — Other Ambulatory Visit: Payer: Self-pay

## 2018-09-28 ENCOUNTER — Other Ambulatory Visit: Payer: Medicare HMO

## 2018-09-28 DIAGNOSIS — R399 Unspecified symptoms and signs involving the genitourinary system: Secondary | ICD-10-CM | POA: Diagnosis not present

## 2018-09-28 LAB — MICROSCOPIC EXAMINATION: Renal Epithel, UA: NONE SEEN /hpf

## 2018-09-28 LAB — URINALYSIS, COMPLETE
Bilirubin, UA: NEGATIVE
Glucose, UA: NEGATIVE
Ketones, UA: NEGATIVE
Leukocytes,UA: NEGATIVE
Nitrite, UA: NEGATIVE
Protein,UA: NEGATIVE
RBC, UA: NEGATIVE
Specific Gravity, UA: 1.01 (ref 1.005–1.030)
Urobilinogen, Ur: 0.2 mg/dL (ref 0.2–1.0)
pH, UA: 6 (ref 5.0–7.5)

## 2018-09-28 NOTE — Telephone Encounter (Signed)
Yes I see that, did she already take the fosfomycin because if she Artie took the fosfomycin and there is no point in leaving the urine, if she did not then yes she can drop off a urine.

## 2018-09-28 NOTE — Telephone Encounter (Signed)
Patient states that she has not taken the fosfomycin $95.  Aware to bring in urine specimen

## 2018-09-28 NOTE — Patient Instructions (Signed)
   Quick Sleep Tips Follow these tips to establish healthy sleep habits: . Get up at the same time every day, even on weekends or during vacations.  . Set a bedtime that is early enough for you to get at least 7 hours of sleep.  . Don't go to bed unless you are sleepy.  . If you don't fall asleep after 20 minutes, get out of bed.  . Establish a relaxing bedtime routine.  . Use your bed only for sleep and sex.  . Make your bedroom quiet and relaxing.  [name] Keep the room at a comfortable, cool temperature.  . Limit exposure to bright light in the evenings.  . Turn off electronic devices at least 30 minutes before bedtime.  . Don't eat a large meal before bedtime. If you are hungry at night, eat a light, healthy snack.  . Exercise regularly and maintain a healthy diet.  [name] Avoid consuming caffeine in the late afternoon or evening.  . Avoid consuming alcohol before bedtime.  . Reduce your fluid intake before bedtime.  (Source: http://sleepeducation.org/essentials-in-sleep/healthy-sleep-habits)  I will prepare a sleep hygiene questionnaire in preparation for a telephone visit within the next two weeks to keep our visit focused on this problem and the impact it has on her on her overall health and how it relates to her chronic medical conditions. I will also prepare printed materials to mail to her home for review.   Chong Sicilian, RN-BC, BSN Nurse Care Manager [facility] [phone]

## 2018-09-28 NOTE — Telephone Encounter (Signed)
Schedule her for a tele-visit with the on-call provider.

## 2018-09-28 NOTE — Chronic Care Management (AMB) (Signed)
  Chronic Care Management   Follow Up Note   09/26/2018 Name: Kristy Garza MRN: [id number] DOB: Mar 12, 1951  Referred by: [name], DO Reason for referral : Chronic Care Management (RNCM chart review)   Kristy Garza is a 67 y.o. year old female who is a primary care patient of [name], DO. The CCM team was consulted for assistance with chronic disease management and care coordination needs.  [name] has many chronic medical conditions as well as an adjustment disorder with an anxious mood which can complicate her medical management.    She last spoke with Legrand Como "Scott" Forrest, LCSW with the [facility] CCM Team regarding her psychosocial needs  several weeks ago. She reported difficulty with sleeping and Scott referred her to me to discuss her sleep hygiene and to discuss any nursing needs that may be affecting her sleep. Sleep difficulties have been addressed at previous visits with PCP.   I will prepare a sleep hygiene questionnaire in preparation for a telephone visit within the next two weeks to keep our visit focused on this problem and the impact it has on her on her overall health and how it relates to her chronic medical conditions. I will also prepare printed materials to mail to her home for review.   Chong Sicilian, RN-BC, BSN Nurse Care Manager [place] [phone]        SIGNATURE

## 2018-10-06 ENCOUNTER — Other Ambulatory Visit: Payer: Self-pay | Admitting: Allergy and Immunology

## 2018-10-09 DIAGNOSIS — N3000 Acute cystitis without hematuria: Secondary | ICD-10-CM | POA: Diagnosis not present

## 2018-10-09 DIAGNOSIS — R300 Dysuria: Secondary | ICD-10-CM | POA: Diagnosis not present

## 2018-10-09 DIAGNOSIS — N3010 Interstitial cystitis (chronic) without hematuria: Secondary | ICD-10-CM | POA: Diagnosis not present

## 2018-10-11 ENCOUNTER — Ambulatory Visit (INDEPENDENT_AMBULATORY_CARE_PROVIDER_SITE_OTHER): Payer: Medicare HMO | Admitting: Licensed Clinical Social Worker

## 2018-10-11 ENCOUNTER — Ambulatory Visit: Payer: Self-pay | Admitting: *Deleted

## 2018-10-11 DIAGNOSIS — F411 Generalized anxiety disorder: Secondary | ICD-10-CM

## 2018-10-11 DIAGNOSIS — D649 Anemia, unspecified: Secondary | ICD-10-CM | POA: Diagnosis not present

## 2018-10-11 DIAGNOSIS — E440 Moderate protein-calorie malnutrition: Secondary | ICD-10-CM

## 2018-10-11 DIAGNOSIS — R519 Headache, unspecified: Secondary | ICD-10-CM

## 2018-10-11 DIAGNOSIS — K219 Gastro-esophageal reflux disease without esophagitis: Secondary | ICD-10-CM

## 2018-10-11 DIAGNOSIS — R5383 Other fatigue: Secondary | ICD-10-CM

## 2018-10-11 NOTE — Chronic Care Management (AMB) (Signed)
  Chronic Care Management   Collaboration Note  10/11/2018 Name: Kristy Garza MRN: [id number] DOB: [birth year]  I was consulted by Kristy Nan, LCSW regarding the recent bone density scan that Kristy Garza had at [facility] on 09/25/18. She mentioned during their visit that she has not received the results.   Chart review completed and the current results are not yet available. Prior study from 09/2018 reviewed with the following result: The BMD measured at Femur Total Right is 0.405 g/cm2 with a T-score of -4.8. This patient is considered osteoporotic according to [facility]) criteria. Compared with the prior study on 07/27/2015 ,the BMD of the total hip/ shows no statistically significant change .Scan Quality good . Exclusions: L3  Med list reviewed. She is taking Calcium and Vit D daily but no medications to treat osteoporosis. Prolia was recommended by her PCP last year and I discussed it with her extensively. She was reluctant to start treatment due to chronic jaw problems/pain and ultimately declined treatment due to the risk for jaw necrosis. She was referred to Kristy Garza for management but I am unsure if she kept that appointment.   Follow up plan:  I will advise that results are not available and that she will be contacted once they have been received and reviewed.   The care management team will reach out to the patient again over the next 30 days.    Chong Sicilian BSN, RN-BC Embedded Chronic Care Manager [facility] / [place] Management Direct Dial: [phone]

## 2018-10-11 NOTE — Chronic Care Management (AMB) (Signed)
Care Management Note   Kristy Garza is a 68 y.o. year old female who is a primary care patient of [name], DO. The CM team was consulted for assistance with chronic disease management and care coordination.   I reached out to [name] by phone today.   Review of patient status, including review of consultants reports, relevant laboratory and other test results, and collaboration with appropriate care team members and the patient's provider was performed as part of comprehensive patient evaluation and provision of chronic care management services.   Social Determinants of Health:risk for Social Isolation; risk for inactivity (physical inactivity)    Office Visit from 08/28/2018 in [place]  PHQ-9 Total Score  0     GAD 7 : Generalized Anxiety Score 08/28/2018 05/23/2018 04/11/2018  Nervous, Anxious, on Edge 0 3 1  Control/stop worrying 0 2 0  Worry too much - different things 0 2 0  Trouble relaxing 0 2 0  Restless 0 1 0  Easily annoyed or irritable 0 1 0  Afraid - awful might happen 0 1 0  Total GAD 7 Score 0 12 1    Goals    . Client said she wants to talk with someone about her health issues and how she deals with them (pt-stated)     Current Barriers:  . Limited access to food . Mental Health Concerns   . Pain issues  Clinical Social Work Clinical Goal(s):  [name] Over the next 30 day days, client will work with LCSW to address concerns related to health issues of client and how she deals with health issues  Interventions: . Encouraged client to talk with RN CM about nursing needs of client . Provided counseling support for client . Talked with client about health issues of concern   Patient Self Care Activities:  . Attends all scheduled provider appointments  . Self administers prescribed medications . Calls provider office for new concerns or questions  Plan: Patient to contact LCSW to discuss psychosocial needs of cllient  LCSW will call client in 3 weeks to discuss health needs of client  and client management of health needs Client to attend scheduled medical appointments Client to communicate with RN CM to discuss nursing needs of client  *Initial goal documentation        .          Client has her prescribed medications and is taking medications as prescribed.  She has appointment on 11/01/2018 with [facility]. She spoke of sleep difficulty.  She spoke of TMJ issues. She said she wears a mouth guard at night to help with TMJ issues. LCSW encouraged client to talk with [facility] regarding nursing needs. She said she has not had any recent falls. Client said she had bone density test about 3 weeks ago and has not heard results yet of test.  LCSW informed client that LCSW would inform RNCM of client question regarding bone test results.  She spoke of neck pain issues, and headaches. She likes to relax by watching TV,or, working in garden.  She has some support from her spouse,her daughter, and her mother. Client spoke of physical therapy she has had in the past.  She spoke of trying to deal with pain issues at present.    Follow Up Plan: LCSW to call client in next 3 weeks to discuss health needs of client and client management of health needs  [name].Londen Lorge MSW, LCSW Licensed Clinical  Social Worker [facility] Care Management [phone]

## 2018-10-11 NOTE — Patient Instructions (Signed)
Licensed Clinical Social Worker Visit Information  Goals we discussed today:  Goals        . Client said she wants to talk with someone about her health issues and how she deals with them (pt-stated)     Current Barriers:  . Limited access to food . Mental Health Concerns   . Pain issues  Clinical Social Work Clinical Goal(s):  [name] Over the next 30 day days, client will work with LCSW to address concerns related to health issues of client and how she deals with health issues  Interventions: . Encouraged client to talk with RN CM about nursing needs of client . Provided counseling support for client . Talked with client about health issues of concern   Patient Self Care Activities:  . Attends all scheduled provider appointments  . Self administers prescribed medications . Calls provider office for new concerns or questions  Plan: Patient to contact LCSW to discuss psychosocial needs of cllient LCSW will call client in 3 weeks to discuss health needs of client  and client management of health needs Client to attend scheduled medical appointments Client to communicate with RN CM to discuss nursing needs of client  *Initial goal documentation             Materials Provided: No  Follow Up Plan: LCSW to call client in next 3 weeks to discuss health needs of client and client management of health needs  The patient verbalized understanding of instructions provided today and declined a print copy of patient instruction materials.   [name].Keyandre Pileggi MSW, LCSW Licensed Clinical Social Worker [facility] Care Management [phone]

## 2018-10-12 DIAGNOSIS — N3000 Acute cystitis without hematuria: Secondary | ICD-10-CM | POA: Diagnosis not present

## 2018-10-22 ENCOUNTER — Other Ambulatory Visit: Payer: Medicare HMO

## 2018-10-22 ENCOUNTER — Other Ambulatory Visit: Payer: Self-pay

## 2018-10-22 DIAGNOSIS — R5381 Other malaise: Secondary | ICD-10-CM | POA: Diagnosis not present

## 2018-10-22 DIAGNOSIS — N959 Unspecified menopausal and perimenopausal disorder: Secondary | ICD-10-CM | POA: Diagnosis not present

## 2018-10-22 DIAGNOSIS — M810 Age-related osteoporosis without current pathological fracture: Secondary | ICD-10-CM | POA: Diagnosis not present

## 2018-11-01 ENCOUNTER — Ambulatory Visit (INDEPENDENT_AMBULATORY_CARE_PROVIDER_SITE_OTHER): Payer: Medicare HMO | Admitting: Licensed Clinical Social Worker

## 2018-11-01 DIAGNOSIS — R519 Headache, unspecified: Secondary | ICD-10-CM

## 2018-11-01 DIAGNOSIS — D649 Anemia, unspecified: Secondary | ICD-10-CM

## 2018-11-01 DIAGNOSIS — R5383 Other fatigue: Secondary | ICD-10-CM

## 2018-11-01 DIAGNOSIS — E440 Moderate protein-calorie malnutrition: Secondary | ICD-10-CM

## 2018-11-01 DIAGNOSIS — K219 Gastro-esophageal reflux disease without esophagitis: Secondary | ICD-10-CM

## 2018-11-01 DIAGNOSIS — F411 Generalized anxiety disorder: Secondary | ICD-10-CM

## 2018-11-01 NOTE — Patient Instructions (Addendum)
Licensed Clinical Social Worker Visit Information  Goals we discussed today:   Goals    . Client said she wants to talk with someone about her health issues and how she deals with them (pt-stated)     Current Barriers:  . Limited access to food . Mental Health Concerns   . Pain issues  Clinical Social Work Clinical Goal(s):  [name] Over the next 30 day days, client will work with LCSW to address concerns related to health issues of client and how she deals with health issues  Interventions: . Encouraged client to talk with RN CM about nursing needs of client . Talked with client about health issues of concern . Talked with client about upcoming client appointments . Talked with client about CCM program support  Patient Self Care Activities:  . Attends all scheduled provider appointments  . Self administers prescribed medications . Calls provider office for new concerns or questions  Plan: Patient to contact LCSW to discuss psychosocial needs of cllient LCSW will call client in 3 weeks to discuss health needs of client  and client management of health needs Client to attend scheduled medical appointments Client to communicate with RN CM to discuss nursing needs of client  *Initial goal documentation           Materials Provided: No  Follow Up Plan: LCSW to call client in next 3 weeks to discuss health needs of client and client management of health needs  The patient verbalized understanding of instructions provided today and declined a print copy of patient instruction materials.   [name].Almyra Birman MSW, LCSW Licensed Clinical Social Worker [facility] Care Management [phone]

## 2018-11-01 NOTE — Chronic Care Management (AMB) (Signed)
  Care Management Note   Kristy Garza is a 68 y.o. year old female who is a primary care patient of [name], DO. The CM team was consulted for assistance with chronic disease management and care coordination.   I reached out to [name] by phone today.   Review of patient status, including review of consultants reports, relevant laboratory and other test results, and collaboration with appropriate care team members and the patient's provider was performed as part of comprehensive patient evaluation and provision of chronic care management services.   Social Determinants of Health: risk of social isolation; risk of physical inactivity    Office Visit from 08/28/2018 in [place]  PHQ-9 Total Score  0     GAD 7 : Generalized Anxiety Score 08/28/2018 05/23/2018 04/11/2018  Nervous, Anxious, on Edge 0 3 1  Control/stop worrying 0 2 0  Worry too much - different things 0 2 0  Trouble relaxing 0 2 0  Restless 0 1 0  Easily annoyed or irritable 0 1 0  Afraid - awful might happen 0 1 0  Total GAD 7 Score 0 12 1   Goals    . Client said she wants to talk with someone about her health issues and how she deals with them (pt-stated)     Current Barriers:  . Limited access to food . Mental Health Concerns   . Pain issues  Clinical Social Work Clinical Goal(s):  [name] Over the next 30 day days, client will work with LCSW to address concerns related to health issues of client and how she deals with health issues  Interventions: . Encouraged client to talk with RN CM about nursing needs of client . Talked with client about upcoming client medical appointments . Talked with client about health issues of concern . Talked with client about CCM program support   Patient Self Care Activities:  . Attends all scheduled provider appointments  . Self administers prescribed medications . Calls provider office for new concerns or questions  Plan: Patient to  contact LCSW to discuss psychosocial needs of cllient LCSW will call client in 3 weeks to discuss health needs of client  and client management of health needs Client to attend scheduled medical appointments Client to communicate with RN CM to discuss nursing needs of client  *Initial goal documentation       Client said she had her prescribed medications and is taking medications as prescribed . Client is having some difficulty sleeping.  She said she is eating well.  She said she fell recently at home of her daughter but client feels that she is not seriously hurt.  She said she is sore at present .She said she has appointment with [place] doctor next week.   LCSW encouraged client to call RNCM as needed for nursing support  Follow Up Plan: LCSW to call client in next 3 weeks to discuss health needs of client and client management of health needs faced  [name].Eowyn Tabone MSW, LCSW Licensed Clinical Social Worker [facility] [phone]

## 2018-11-06 DIAGNOSIS — M810 Age-related osteoporosis without current pathological fracture: Secondary | ICD-10-CM | POA: Diagnosis not present

## 2018-11-15 DIAGNOSIS — R69 Illness, unspecified: Secondary | ICD-10-CM | POA: Diagnosis not present

## 2018-11-22 ENCOUNTER — Ambulatory Visit: Payer: Self-pay | Admitting: Licensed Clinical Social Worker

## 2018-11-22 DIAGNOSIS — E440 Moderate protein-calorie malnutrition: Secondary | ICD-10-CM

## 2018-11-22 DIAGNOSIS — R5383 Other fatigue: Secondary | ICD-10-CM

## 2018-11-22 DIAGNOSIS — R519 Headache, unspecified: Secondary | ICD-10-CM

## 2018-11-22 DIAGNOSIS — F411 Generalized anxiety disorder: Secondary | ICD-10-CM

## 2018-11-22 DIAGNOSIS — D649 Anemia, unspecified: Secondary | ICD-10-CM

## 2018-11-22 DIAGNOSIS — K219 Gastro-esophageal reflux disease without esophagitis: Secondary | ICD-10-CM

## 2018-11-22 NOTE — Chronic Care Management (AMB) (Signed)
Care Management Note   Kristy Garza is a 68 y.o. year old female who is a primary care patient of [name], DO. The CM team was consulted for assistance with chronic disease management and care coordination.   I reached out to Kristy Garza by phone today.   Review of patient status, including review of consultants reports, relevant laboratory and other test results, and collaboration with appropriate care team members and the patient's provider was performed as part of comprehensive patient evaluation and provision of chronic care management services.   Social Determinants of Health: risk of social isolation; risk of physical inactivity    Office Visit from 08/28/2018 in [place]  PHQ-9 Total Score  0     GAD 7 : Generalized Anxiety Score 08/28/2018 05/23/2018 04/11/2018  Nervous, Anxious, on Edge 0 3 1  Control/stop worrying 0 2 0  Worry too much - different things 0 2 0  Trouble relaxing 0 2 0  Restless 0 1 0  Easily annoyed or irritable 0 1 0  Afraid - awful might happen 0 1 0  Total GAD 7 Score 0 12 1   Medications   New medications from outside sources are available for reconciliation   acetaminophen (TYLENOL) 500 MG tablet    albuterol (PROVENTIL HFA;VENTOLIN HFA) 108 (90 Base) MCG/ACT inhaler    azelastine (ASTELIN) 0.1 % nasal spray    b complex vitamins capsule    calcium citrate-vitamin D (CITRACAL+D) 315-200 MG-UNIT tablet    cetirizine HCl (ZYRTEC) 5 MG/5ML SOLN    dextromethorphan-guaiFENesin (MUCINEX DM) 30-600 MG 12hr tablet    Ergocalciferol (VITAMIN D2) 2000 units TABS    Fluticasone Furoate (ARNUITY ELLIPTA) 200 MCG/ACT AEPB    hyoscyamine (LEVSIN SL) 0.125 MG SL tablet    lidocaine (XYLOCAINE) 2 % jelly    mometasone (NASONEX) 50 MCG/ACT nasal spray    pantoprazole (PROTONIX) 40 MG tablet    phenylephrine (SUDAFED PE) 10 MG TABS tablet    Polyethylene Glycol 3350 (MIRALAX PO)    Prasterone, DHEA, 25 MG CAPS    Probiotic Product (PROBIOTIC PO)    Simethicone (GAS-X PO)    tizanidine (ZANAFLEX) 2 MG capsule    traMADol (ULTRAM) 50 MG tablet    triamcinolone cream (KENALOG) 0.1 %    UNABLE TO FIND    Goals :      . Client said she wants to talk with someone about her health issues and how she deals with them (pt-stated)     Current Barriers:  . Limited access to food . Mental Health Concerns   . Pain issues  Clinical Social Work Clinical Goal(s):  Kristy Garza Over the next 30 day days, client will work with LCSW to address concerns related to health issues of client and how she deals with health issues  Interventions: . Encouraged client to talk with RN CM about nursing needs of client . Talked with client about health issues of concern   Patient Self Care Activities:  . Attends all scheduled provider appointments  . Self administers prescribed medications . Calls provider office for new concerns or questions  Plan: Patient to contact LCSW to discuss psychosocial needs of cllient LCSW will call client in 3 weeks to discuss health needs of client  and client management of health needs Client to attend scheduled medical appointments Client to communicate with RN CM to discuss nursing needs of client  *Initial goal documentation  Client said she is having some difficulty with allergy at present. Client said she had her prescribed medications and is taking medications as prescribed Client is sleeping well. She said she is eating well.  She said she had appointment with [place] doctor . LCSW encouraged client to call RNCM as needed for nursing support. Client said she has appointment with Dr. Lajuana Garza in September of 2020.  Client has support from her spouse.   Follow Up Plan: LCSW to call client in next 3 weeks to talk with her about the health needs of client and about the management of health needs of clent  Kristy Garza.Kristy Garza MSW, LCSW Licensed Clinical Social Worker  [facility] [phone]

## 2018-11-22 NOTE — Patient Instructions (Signed)
Licensed Clinical Social Worker Visit Information  Goals we discussed today:  Goals    . Client said she wants to talk with someone about her health issues and how she deals with them (pt-stated)     Current Barriers:  . Limited access to food . Mental Health Concerns   . Pain issues  Clinical Social Work Clinical Goal(s):  [name] Over the next 30 day days, client will work with LCSW to address concerns related to health issues of client and how she deals with health issues  Interventions: . Encouraged client to talk with RN CM about nursing needs of client . Talked with client about health issues of concern   Patient Self Care Activities:  . Attends all scheduled provider appointments  . Self administers prescribed medications . Calls provider office for new concerns or questions  Plan: Patient to contact LCSW to discuss psychosocial needs of cllient LCSW will call client in 3 weeks to discuss health needs of client  and client management of health needs Client to attend scheduled medical appointments Client to communicate with RN CM to discuss nursing needs of client  *Initial goal documentation               Materials Provided: No  Follow Up Plan:  LCSW to call client in next 3 weeks to discuss health needs of client and client management of health needs  The patient verbalized understanding of instructions provided today and declined a print copy of patient instruction materials.   [name].Bre Pecina MSW, LCSW Licensed Clinical Social Worker [facility] [phone]

## 2018-12-06 ENCOUNTER — Telehealth: Payer: Self-pay | Admitting: Family Medicine

## 2018-12-06 NOTE — Telephone Encounter (Signed)
Informed patient not to come in the office tomorrow.  Advised her that Dr. [name] would do a telephone visit to discuss her dexa scan results.

## 2018-12-07 ENCOUNTER — Other Ambulatory Visit: Payer: Self-pay

## 2018-12-07 ENCOUNTER — Ambulatory Visit: Payer: Medicare HMO | Admitting: Allergy

## 2018-12-07 ENCOUNTER — Telehealth: Payer: Self-pay

## 2018-12-07 ENCOUNTER — Ambulatory Visit: Payer: Medicare HMO | Admitting: Family Medicine

## 2018-12-07 ENCOUNTER — Encounter: Payer: Self-pay | Admitting: Allergy

## 2018-12-07 VITALS — BP 112/80 | HR 68 | Temp 98.1°F | Resp 16

## 2018-12-07 DIAGNOSIS — L308 Other specified dermatitis: Secondary | ICD-10-CM

## 2018-12-07 DIAGNOSIS — L989 Disorder of the skin and subcutaneous tissue, unspecified: Secondary | ICD-10-CM

## 2018-12-07 MED ORDER — PIMECROLIMUS 1 % EX CREA: TOPICAL_CREAM | Freq: Two times a day (BID) | CUTANEOUS | 2 refills | Status: DC

## 2018-12-07 MED ORDER — HYDROXYZINE PAMOATE 25 MG PO CAPS: ORAL_CAPSULE | ORAL | 3 refills | Status: DC

## 2018-12-07 MED ORDER — HALCINONIDE 0.1 % EX CREA: TOPICAL_CREAM | CUTANEOUS | 2 refills | Status: DC

## 2018-12-07 NOTE — Telephone Encounter (Signed)
Approved by insurance

## 2018-12-07 NOTE — Progress Notes (Signed)
Follow-up Note  RE: Kristy Garza MRN: [id number] DOB: 1951-03-24 Date of Office Visit: 12/07/2018   History of present illness: Kristy Garza is a 68 y.o. female presenting today for sick visit for a rash.  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  She states her current rash is different from the rash she has had previously.  She states about 12 days ago she developed this itchy red raised rash that has been on her abdomen, legs, back.  She also states that her genitalia area is also quite itchy.  She has been using the mometasone on the rash but she states is not working.  She has been taking Benadryl for itch control as well as Allegra during the day.  She denies any swelling or fevers.  She states that her husband does not have a similar rash.  She does state that she developed cough and headache about a week ago that she is using Mucinex with good relief of symptoms. She states she went to an acupuncturist about a week ago and that seemed to help with the itch. She states she went to an integrative doctor recently who tested her for allergies via blood work.  She states she was started on sublingual drops that she started about a month ago but she stopped these about a week ago and she thought maybe it was contributing to her rash. She states about a month ago she was gardening and helping her husband move hay and she developed a itchy rash then but she states her current rash is different from that rash. She denies any new medications, new foods, stings or change in body products, detergents.  Review of systems: Review of Systems  Constitutional: Negative for chills, fever and malaise/fatigue.  HENT: Negative for congestion, ear discharge, nosebleeds, sinus pain and sore throat.   Eyes: Negative for pain, discharge and redness.  Respiratory: Positive for cough. Negative for sputum production, shortness of breath and wheezing.    Cardiovascular: Negative for chest pain.  Gastrointestinal: Negative for abdominal pain, constipation, diarrhea, heartburn, nausea and vomiting.  Musculoskeletal: Negative for joint pain.  Skin: Positive for itching and rash.  Neurological: Positive for headaches.    All other systems negative unless noted above in HPI  Past medical/social/surgical/family history have been reviewed and are unchanged unless specifically indicated below.  No changes  Medication List: Allergies as of 12/07/2018      Reactions   Cefuroxime Axetil Other (See Comments)   Extreme gas   Gabapentin Other (See Comments)   Constipation Constipation   Pregabalin Other (See Comments)   Drowsiness, dry mouth Drowsiness, dry mouth Makes her tired and thirsty   Augmentin [amoxicillin-pot Clavulanate] Diarrhea   Avelox [moxifloxacin Hcl In Nacl] Other (See Comments)   Tremors   Biaxin [clarithromycin]    Unsure of reaction   Cedax [ceftibuten] Other (See Comments)   tremors   Ciprofloxacin Other (See Comments), Nausea And Vomiting   Blurred vision Blurred vision Pt can't remember reaction   Clindamycin/lincomycin    tachycardia   Levofloxacin Other (See Comments)   Feels dehydrated   Lincomycin Other (See Comments)   tachycardia   Montelukast Sodium Other (See Comments)   Numbness and tingling in hand. Numbness and tingling in hand.   Sulfonamide Derivatives Other (See Comments)   Gi upset      Medication List       Accurate as of  December 07, 2018  1:40 PM. If you have any questions, ask your nurse or doctor.        acetaminophen 500 MG tablet Commonly known as: TYLENOL Take by mouth.   albuterol 108 (90 Base) MCG/ACT inhaler Commonly known as: VENTOLIN HFA Inhale 2 puffs into the lungs every 4 (four) hours as needed for wheezing or shortness of breath.   Arnuity Ellipta 200 MCG/ACT Aepb Generic drug: Fluticasone Furoate Inhale 1 puff into the lungs daily.   azelastine 0.1 %  nasal spray Commonly known as: ASTELIN PLACE 2 SPRAYS INTO BOTH NOSTRILS 2 (TWO) TIMES DAILY. USE IN EACH NOSTRIL AS DIRECTED   b complex vitamins capsule Take 1 capsule by mouth daily.   calcium citrate-vitamin D 315-200 MG-UNIT tablet Commonly known as: CITRACAL+D Take 2 tablets by mouth 2 (two) times daily.   cetirizine HCl 5 MG/5ML Soln Commonly known as: Zyrtec Take 5 mg by mouth daily.   dextromethorphan-guaiFENesin 30-600 MG 12hr tablet Commonly known as: MUCINEX DM Take 1 tablet by mouth 2 (two) times daily.   GAS-X PO Take by mouth.   Halcinonide 0.1 % Crea Apply thin layer to skin twice daily as needed Started by: Jhana Giarratano Charmian Muff, MD   hydrOXYzine 25 MG capsule Commonly known as: VISTARIL Take 1 tablet by mouth at bedtime as needed for itch Started by: Dori Devino Charmian Muff, MD   hyoscyamine 0.125 MG SL tablet Commonly known as: LEVSIN SL PLEASE SEE ATTACHED FOR DETAILED DIRECTIONS   lidocaine 2 % jelly Commonly known as: XYLOCAINE Apply topically as needed.   MIRALAX PO Take by mouth as needed.   mometasone 50 MCG/ACT nasal spray Commonly known as: NASONEX PLACE 2 SPRAYS INTO THE NOSE DAILY.   pantoprazole 40 MG tablet Commonly known as: PROTONIX Take 1 tablet (40 mg total) by mouth daily.   phenylephrine 10 MG Tabs tablet Commonly known as: SUDAFED PE Take 10 mg by mouth every 4 (four) hours as needed.   pimecrolimus 1 % cream Commonly known as: ELIDEL Apply topically 2 (two) times daily. Started by: Keyshon Stein Charmian Muff, MD   Prasterone (DHEA) 25 MG Caps Take 2 mg by mouth daily.   PROBIOTIC PO Take by mouth daily.   tizanidine 2 MG capsule Commonly known as: ZANAFLEX Take 1-2 capsules (2-4 mg total) by mouth 3 (three) times daily as needed for muscle spasms.   traMADol 50 MG tablet Commonly known as: ULTRAM Take 1 tablet (50 mg total) by mouth every 12 (twelve) hours as needed.   triamcinolone cream 0.1 % Commonly  known as: KENALOG Apply 1 application topically 2 (two) times daily.   UNABLE TO FIND Take 1 capsule by mouth daily. Dv3- vitamin D 3 plus immune support   Vitamin D2 50 MCG (2000 UT) Tabs Take 2,000 Units by mouth daily.       Known medication allergies: Allergies  Allergen Reactions  . Cefuroxime Axetil Other (See Comments)    Extreme gas  . Gabapentin Other (See Comments)    Constipation Constipation  . Pregabalin Other (See Comments)    Drowsiness, dry mouth Drowsiness, dry mouth Makes her tired and thirsty  . Augmentin [Amoxicillin-Pot Clavulanate] Diarrhea  . Avelox [Moxifloxacin Hcl In Nacl] Other (See Comments)    Tremors   . Biaxin [Clarithromycin]     Unsure of reaction  . Cedax [Ceftibuten] Other (See Comments)    tremors  . Ciprofloxacin Other (See Comments) and Nausea And Vomiting    Blurred vision Blurred vision  Pt can't remember reaction  . Clindamycin/Lincomycin     tachycardia  . Levofloxacin Other (See Comments)    Feels dehydrated  . Lincomycin Other (See Comments)    tachycardia  . Montelukast Sodium Other (See Comments)    Numbness and tingling in hand. Numbness and tingling in hand.  . Sulfonamide Derivatives Other (See Comments)    Gi upset     Physical examination: Blood pressure 112/80, pulse 68, temperature 98.1 F (36.7 C), temperature source Temporal, resp. rate 16, SpO2 100 %.  General: Alert, interactive, in no acute distress. HEENT: PERRLA, TMs pearly gray, turbinates non-edematous without discharge, post-pharynx non erythematous. Neck: Supple without lymphadenopathy. Lungs: Clear to auscultation without wheezing, rhonchi or rales. {no increased work of breathing. CV: Normal S1, S2 without murmurs. Abdomen: Nondistended, nontender. Skin: Scattered erythematous papules some with excoriations over her abdomen, bilateral legs, lower back. Extremities:  No clubbing, cyanosis or edema. Neuro:   Grossly intact.   Diagnositics/Labs: None today  Assessment and plan:   Dermatitis, inflammatory  - mometasone currently is not effective rash.  Can stop at this time  - will prescribe a stronger steroid ointment to use on your body -- Halog, thin application 2 times a day as needed  - will also prescribe a non-steroidal agent that can be used in more sensitive areas -- Elidel, thin application 1-2 times a day as needed  - use hydroxyzine 25mg  at bedtime for itch control (this replaces benadryl use)  - continue Allegra 180mg  daily in AM  -At this time unclear of the cause of this dermatitis however it could be viral in nature as she does report having a cough and headache.  Appearance of rash also does not look consistent with bedbugs or scabies in her household member does not have a similar type rash.  Follow-up with Dr. Neldon Mc in 2-3 months  I appreciate the opportunity to take part in Adryan's care. Please do not hesitate to contact me with questions.  Sincerely,   Prudy Feeler, MD Allergy/Immunology [facility]

## 2018-12-07 NOTE — Telephone Encounter (Signed)
Prior authorization requested for hydroxyzine 25 mg capsules. This has been submitted on [email or web address]. Status is pending at this time.

## 2018-12-07 NOTE — Patient Instructions (Signed)
Dermatitis  - mometasone currently is not effective in treating your rash.  Can stop at this time  - will prescribe a stronger steroid ointment to use on your body -- Halog thin application 2 times a day as needed  - will also prescribe a non-steroidal agent that can be used in more sensitive areas -- Elidel thin application 1-2 times a day as needed  - use hydroxyzine 25mg  at bedtime for itch control (this replaces benadryl use)  - continue Allegra 180mg  daily in AM  Follow-up with Dr. Neldon Mc in 2-3 months

## 2018-12-11 ENCOUNTER — Other Ambulatory Visit: Payer: Medicare HMO

## 2018-12-11 ENCOUNTER — Other Ambulatory Visit: Payer: Self-pay

## 2018-12-11 DIAGNOSIS — R5381 Other malaise: Secondary | ICD-10-CM | POA: Diagnosis not present

## 2018-12-11 DIAGNOSIS — N959 Unspecified menopausal and perimenopausal disorder: Secondary | ICD-10-CM | POA: Diagnosis not present

## 2018-12-14 ENCOUNTER — Ambulatory Visit (INDEPENDENT_AMBULATORY_CARE_PROVIDER_SITE_OTHER): Payer: Medicare HMO | Admitting: Licensed Clinical Social Worker

## 2018-12-14 ENCOUNTER — Ambulatory Visit: Payer: Self-pay | Admitting: *Deleted

## 2018-12-14 DIAGNOSIS — R519 Headache, unspecified: Secondary | ICD-10-CM

## 2018-12-14 DIAGNOSIS — F411 Generalized anxiety disorder: Secondary | ICD-10-CM

## 2018-12-14 DIAGNOSIS — R5383 Other fatigue: Secondary | ICD-10-CM

## 2018-12-14 DIAGNOSIS — M26623 Arthralgia of bilateral temporomandibular joint: Secondary | ICD-10-CM

## 2018-12-14 DIAGNOSIS — M810 Age-related osteoporosis without current pathological fracture: Secondary | ICD-10-CM

## 2018-12-14 DIAGNOSIS — D649 Anemia, unspecified: Secondary | ICD-10-CM

## 2018-12-14 DIAGNOSIS — K219 Gastro-esophageal reflux disease without esophagitis: Secondary | ICD-10-CM

## 2018-12-14 DIAGNOSIS — E440 Moderate protein-calorie malnutrition: Secondary | ICD-10-CM

## 2018-12-14 NOTE — Chronic Care Management (AMB) (Signed)
Care Management Note   Kristy Garza is a 68 y.o. year old female who is a primary care patient of [name], DO. The CM team was consulted for assistance with chronic disease management and care coordination.   I reached out to [name] by phone today.   Review of patient status, including review of consultants reports, relevant laboratory and other test results, and collaboration with appropriate care team members and the patient's provider was performed as part of comprehensive patient evaluation and provision of chronic care management services.   Social Determinants of Health: risk of social isolation; risk of physical inactivity    Office Visit from 08/28/2018 in [place]  PHQ-9 Total Score  0     GAD 7 : Generalized Anxiety Score 08/28/2018 05/23/2018 04/11/2018  Nervous, Anxious, on Edge 0 3 1  Control/stop worrying 0 2 0  Worry too much - different things 0 2 0  Trouble relaxing 0 2 0  Restless 0 1 0  Easily annoyed or irritable 0 1 0  Afraid - awful might happen 0 1 0  Total GAD 7 Score 0 12 1   Medications    acetaminophen (TYLENOL) 500 MG tablet    albuterol (PROVENTIL HFA;VENTOLIN HFA) 108 (90 Base) MCG/ACT inhaler    azelastine (ASTELIN) 0.1 % nasal spray    b complex vitamins capsule    calcium citrate-vitamin D (CITRACAL+D) 315-200 MG-UNIT tablet    cetirizine HCl (ZYRTEC) 5 MG/5ML SOLN    dextromethorphan-guaiFENesin (MUCINEX DM) 30-600 MG 12hr tablet    Ergocalciferol (VITAMIN D2) 2000 units TABS    Fluticasone Furoate (ARNUITY ELLIPTA) 200 MCG/ACT AEPB    Halcinonide 0.1 % CREA    hydrOXYzine (VISTARIL) 25 MG capsule    hyoscyamine (LEVSIN SL) 0.125 MG SL tablet    lidocaine (XYLOCAINE) 2 % jelly    mometasone (NASONEX) 50 MCG/ACT nasal spray    pantoprazole (PROTONIX) 40 MG tablet    phenylephrine (SUDAFED PE) 10 MG TABS tablet    pimecrolimus (ELIDEL) 1 % cream    Polyethylene Glycol 3350 (MIRALAX PO)    Prasterone, DHEA, 25 MG CAPS    Probiotic Product (PROBIOTIC PO)    Simethicone (GAS-X PO)    tizanidine (ZANAFLEX) 2 MG capsule    traMADol (ULTRAM) 50 MG tablet    triamcinolone cream (KENALOG) 0.1 %     Goals        . Client said she wants to talk with someone about her health issues and how she deals with them (pt-stated)     Current Barriers:  . Limited access to food . Mental Health Concerns   . Pain issues  Clinical Social Work Clinical Goal(s):  [name] Over the next 30 day days, client will work with LCSW to address concerns related to health issues of client and how she deals with health issues  Interventions: . Encouraged client to talk with RN CM about nursing needs of client . Talked with client about pain issues of client . Talked with client about health issues of concern . Talked with client about upcoming medical appointments   Patient Self Care Activities:  . Attends all scheduled provider appointments  . Self administers prescribed medications . Calls provider office for new concerns or questions  Plan: Patient to contact LCSW to discuss psychosocial needs of cllient LCSW will call client in 3 weeks to discuss health needs of client  and client management of health needs Client to attend  scheduled medical appointments Client to communicate with RN CM to discuss nursing needs of client  *Initial goal documentation        Client has prescribed medications and is taking medications as prescribed. Client spoke of pain issues faced. Client said she has appointment scheduled with Dr. [name] in October of 2020.  Client has appointment with [place] Doctor next Wednesday. LCSW encouraged client to talk with RNCM to discuss nursing needs of client.  Client has dental appointment at the end of September, 2020. She is sleeping well. She is eating well. She has support from her spouse, from her daughters, and from her mother.  Her spouse transports her to  and from medical appointments . LCSW encouraged client to call RNCM as needed to discuss nursing needs of client  Follow Up Plan: LCSW to call client in next 3 weeks to talk about health needs of client and client management of health needs  [name].Tellis Spivak MSW, LCSW Licensed Clinical Social Worker [facility] [phone]

## 2018-12-14 NOTE — Patient Instructions (Addendum)
Licensed Clinical Social Worker Visit Information  Goals we discussed today:  Goals        . Client said she wants to talk with someone about her health issues and how she deals with them (pt-stated)     Current Barriers:  . Limited access to food . Mental Health Concerns   . Pain issues  Clinical Social Work Clinical Goal(s):  [name] Over the next 30 day days, client will work with LCSW to address concerns related to health issues of client and how she deals with health issues  Interventions: . Encouraged client to talk with RN CM about nursing needs of client . Talked with client about pain issues faced by client  .  Talked with client about health issues of concern   . Talked with client about upcoming client medical appointments  Patient Self Care Activities:  . Attends all scheduled provider appointments  . Self administers prescribed medications . Calls provider office for new concerns or questions  Plan: Patient to contact LCSW to discuss psychosocial needs of cllient LCSW will call client in 3 weeks to discuss health needs of client  and client management of health needs  Client to attend scheduled medical appointments Client to communicate with RN CM to discuss nursing needs of client  *Initial goal documentation            Materials Provided: No  Follow Up Plan: LCSW will call client in 3 weeks to discuss health needs of client  and client management of health needs   The patient verbalized understanding of instructions provided today and declined a print copy of patient instruction materials.   [name].Devynn Hessler MSW, LCSW Licensed Clinical Social Worker [facility] [phone]

## 2018-12-14 NOTE — Chronic Care Management (AMB) (Signed)
  Chronic Care Management   Care Coordination Note  12/14/2018 Name: JUSTUS STREBE MRN: [id number] DOB: 04/14/1950  Consulted by Theadore Nan, LCSW regarding his telephone visit with Ms Dukart today. Remotely assisted Scott locate Ms Layton's most recent dexa scan report per her request and printer remotely for Irwin to put in the mail to Ms Alfson.   I reviewed the dexa results and provider's recommendation to schedule a 30 min visit with her to discuss treatment options. Appointments reviewed. Patient is scheduled for a telephone visit on 01/04/19 but it was for 15 min. I did adjust the appointment and made it into a 30 min visit but Dr Marjean Donna schedule was already full for the day so it didn't really make a difference.    Follow up plan: Patient to f/u with Dr [name] on 01/04/2019 to discuss dexa results LCSW to continue to follow for psychosocial issues RN CCM will f/u over the 8 weeks  Chong Sicilian, BSN, RN-BC [facility] Direct Dial: [phone]

## 2018-12-14 NOTE — Patient Instructions (Addendum)
  Chronic Care Management    Follow up plan: Patient to f/u with Dr [name] on 01/04/2019 to discuss dexa results LCSW to continue to follow for psychosocial issues RN CCM will f/u over the 8 weeks  Chong Sicilian, BSN, RN-BC [facility] Direct Dial: [phone]

## 2019-01-04 ENCOUNTER — Ambulatory Visit (INDEPENDENT_AMBULATORY_CARE_PROVIDER_SITE_OTHER): Payer: Medicare HMO | Admitting: Family Medicine

## 2019-01-04 DIAGNOSIS — G8929 Other chronic pain: Secondary | ICD-10-CM

## 2019-01-04 DIAGNOSIS — J301 Allergic rhinitis due to pollen: Secondary | ICD-10-CM | POA: Diagnosis not present

## 2019-01-04 DIAGNOSIS — M810 Age-related osteoporosis without current pathological fracture: Secondary | ICD-10-CM | POA: Diagnosis not present

## 2019-01-04 DIAGNOSIS — R69 Illness, unspecified: Secondary | ICD-10-CM | POA: Diagnosis not present

## 2019-01-04 DIAGNOSIS — R6884 Jaw pain: Secondary | ICD-10-CM

## 2019-01-04 DIAGNOSIS — F411 Generalized anxiety disorder: Secondary | ICD-10-CM | POA: Diagnosis not present

## 2019-01-04 MED ORDER — TRAMADOL HCL 50 MG PO TABS: 50.0000 mg | ORAL_TABLET | Freq: Two times a day (BID) | ORAL | 1 refills | Status: DC | PRN

## 2019-01-04 MED ORDER — LORAZEPAM 0.5 MG PO TABS: 0.5000 mg | ORAL_TABLET | Freq: Two times a day (BID) | ORAL | 0 refills | Status: DC | PRN

## 2019-01-04 MED ORDER — AZELASTINE HCL 0.1 % NA SOLN: 2.0000 | Freq: Two times a day (BID) | NASAL | 2 refills | Status: DC

## 2019-01-04 NOTE — Patient Instructions (Signed)
Osteoporosis  Osteoporosis is thinning and loss of density in your bones. Osteoporosis makes bones more brittle and fragile and more likely to break (fracture). Over time, osteoporosis can cause your bones to become so weak that they fracture after a minor fall. Bones in the hip, wrist, and spine are most likely to fracture due to osteoporosis. What are the causes? The exact cause of this condition is not known. What increases the risk? You may be at greater risk for osteoporosis if you:  Have a family history of the condition.  Have poor nutrition.  Use steroid medicines, such as prednisone.  Are female.  Are age 54 or older.  Smoke or have a history of smoking.  Are not physically active (are sedentary).  Are white (Caucasian) or of Asian descent.  Have a small body frame.  Take certain medicines, such as antiseizure medicines. What are the signs or symptoms? A fracture might be the first sign of osteoporosis, especially if the fracture results from a fall or injury that usually would not cause a bone to break. Other signs and symptoms include:  Pain in the neck or low back.  Stooped posture.  Loss of height. How is this diagnosed? This condition may be diagnosed based on:  Your medical history.  A physical exam.  A bone mineral density test, also called a DXA or DEXA test (dual-energy X-ray absorptiometry test). This test uses X-rays to measure the amount of minerals in your bones. How is this treated? The goal of treatment is to strengthen your bones and lower your risk for a fracture. Treatment may involve:  Making lifestyle changes, such as: ? Including foods with more calcium and vitamin D in your diet. ? Doing weight-bearing and muscle-strengthening exercises. ? Stopping tobacco use. ? Limiting alcohol intake.  Taking medicine to slow the process of bone loss or to increase bone density.  Taking daily supplements of calcium and vitamin D.  Taking  hormone replacement medicines, such as estrogen for women and testosterone for men.  Monitoring your levels of calcium and vitamin D. Follow these instructions at home:  Activity  Exercise as told by your health care provider. Ask your health care provider what exercises and activities are safe for you. You should do: ? Exercises that make you work against gravity (weight-bearing exercises), such as tai chi, yoga, or walking. ? Exercises to strengthen muscles, such as lifting weights. Lifestyle  Limit alcohol intake to no more than 1 drink a day for nonpregnant women and 2 drinks a day for men. One drink equals 12 oz of beer, 5 oz of wine, or 1 oz of hard liquor.  Do not use any products that contain nicotine or tobacco, such as cigarettes and e-cigarettes. If you need help quitting, ask your health care provider. Preventing falls  Use devices to help you move around (mobility aids) as needed, such as canes, walkers, scooters, or crutches.  Keep rooms well-lit and clutter-free.  Remove tripping hazards from walkways, including cords and throw rugs.  Install grab bars in bathrooms and safety rails on stairs.  Use rubber mats in the bathroom and other areas that are often wet or slippery.  Wear closed-toe shoes that fit well and support your feet. Wear shoes that have rubber soles or low heels.  Review your medicines with your health care provider. Some medicines can cause dizziness or changes in blood pressure, which can increase your risk of falling. General instructions  Include calcium and vitamin D in  your diet. Calcium is important for bone health, and vitamin D helps your body to absorb calcium. Good sources of calcium and vitamin D include: ? Certain fatty fish, such as salmon and tuna. ? Products that have calcium and vitamin D added to them (fortified products), such as fortified cereals. ? Egg yolks. ? Cheese. ? Liver.  Take over-the-counter and prescription medicines  only as told by your health care provider.  Keep all follow-up visits as told by your health care provider. This is important. Contact a health care provider if:  You have never been screened for osteoporosis and you are: ? A woman who is age 61 or older. ? A man who is age 63 or older. Get help right away if:  You fall or injure yourself. Summary  Osteoporosis is thinning and loss of density in your bones. This makes bones more brittle and fragile and more likely to break (fracture),even with minor falls.  The goal of treatment is to strengthen your bones and reduce your risk for a fracture.  Include calcium and vitamin D in your diet. Calcium is important for bone health, and vitamin D helps your body to absorb calcium.  Talk with your health care provider about screening for osteoporosis if you are a woman who is age 48 or older, or a man who is age 48 or older. This information is not intended to replace advice given to you by your health care provider. Make sure you discuss any questions you have with your health care provider. Document Released: 12/22/2004 Document Revised: 02/24/2017 Document Reviewed: 01/06/2017 Elsevier Patient Education  2020 [employer].

## 2019-01-04 NOTE — Progress Notes (Signed)
Telephone visit  Subjective: CC: osteoporosis PCP: [name], DO [id number] Kristy Garza is a 68 y.o. female calls for telephone consult today. Patient provides verbal consent for consult held via phone.  Location of patient: home Location of provider: [facility] Others present for call: none  1. Osteoporosis Patient had DEXA scan performed in August which showed a T score of -4.5.  Patient is on an over-the-counter calcium and vitamin D supplement.  No previous treatment for osteoporosis.  She has read about a couple of options including bisphosphonates and Prolia but was told by her chiropractor that Prolia would likely not be a good choice for her as it increases joint pain.  She notes that she has quite a bit of reflux symptoms and is worried about pursuing bisphosphonate.  She has an upcoming appointment with her integrative provider and is going to discuss options further with them.  ROS: Per HPI  Allergies  Allergen Reactions  . Cefuroxime Axetil Other (See Comments)    Extreme gas  . Gabapentin Other (See Comments)    Constipation Constipation  . Pregabalin Other (See Comments)    Drowsiness, dry mouth Drowsiness, dry mouth Makes her tired and thirsty  . Augmentin [Amoxicillin-Pot Clavulanate] Diarrhea  . Avelox [Moxifloxacin Hcl In Nacl] Other (See Comments)    Tremors   . Biaxin [Clarithromycin]     Unsure of reaction  . Cedax [Ceftibuten] Other (See Comments)    tremors  . Ciprofloxacin Other (See Comments) and Nausea And Vomiting    Blurred vision Blurred vision Pt can't remember reaction  . Clindamycin/Lincomycin     tachycardia  . Levofloxacin Other (See Comments)    Feels dehydrated  . Lincomycin Other (See Comments)    tachycardia  . Montelukast Sodium Other (See Comments)    Numbness and tingling in hand. Numbness and tingling in hand.  . Sulfonamide Derivatives Other (See Comments)    Gi upset   Past Medical History:  Diagnosis Date  . Acid  reflux   . Allergy   . Arthritis    ARTHRITIS IN NECK BY DR. Alroy Dust [facility]  . Asthma   . Interstitial cystitis   . Osteoporosis   . PONV (postoperative nausea and vomiting)   . Tinnitus   . Trigeminal neuralgia    Atypical trigeminal neuralgia    Current Outpatient Medications:  .  acetaminophen (TYLENOL) 500 MG tablet, Take by mouth., Disp: , Rfl:  .  albuterol (PROVENTIL HFA;VENTOLIN HFA) 108 (90 Base) MCG/ACT inhaler, Inhale 2 puffs into the lungs every 4 (four) hours as needed for wheezing or shortness of breath., Disp: 18 g, Rfl: 1 .  azelastine (ASTELIN) 0.1 % nasal spray, PLACE 2 SPRAYS INTO BOTH NOSTRILS 2 (TWO) TIMES DAILY. USE IN EACH NOSTRIL AS DIRECTED, Disp: 30 mL, Rfl: 2 .  b complex vitamins capsule, Take 1 capsule by mouth daily., Disp: , Rfl:  .  calcium citrate-vitamin D (CITRACAL+D) 315-200 MG-UNIT tablet, Take 2 tablets by mouth 2 (two) times daily., Disp: , Rfl:  .  cetirizine HCl (ZYRTEC) 5 MG/5ML SOLN, Take 5 mg by mouth daily., Disp: , Rfl:  .  dextromethorphan-guaiFENesin (MUCINEX DM) 30-600 MG 12hr tablet, Take 1 tablet by mouth 2 (two) times daily., Disp: , Rfl:  .  Ergocalciferol (VITAMIN D2) 2000 units TABS, Take 2,000 Units by mouth daily., Disp: , Rfl:  .  Fluticasone Furoate (ARNUITY ELLIPTA) 200 MCG/ACT AEPB, Inhale 1 puff into the lungs daily., Disp: 30 each, Rfl: 0 .  Halcinonide  0.1 % CREA, Apply thin layer to skin twice daily as needed, Disp: 60 g, Rfl: 2 .  hydrOXYzine (VISTARIL) 25 MG capsule, Take 1 tablet by mouth at bedtime as needed for itch, Disp: 30 capsule, Rfl: 3 .  hyoscyamine (LEVSIN SL) 0.125 MG SL tablet, PLEASE SEE ATTACHED FOR DETAILED DIRECTIONS, Disp: , Rfl:  .  lidocaine (XYLOCAINE) 2 % jelly, Apply topically as needed., Disp: 30 mL, Rfl: 3 .  mometasone (NASONEX) 50 MCG/ACT nasal spray, PLACE 2 SPRAYS INTO THE NOSE DAILY., Disp: 17 g, Rfl: 3 .  pantoprazole (PROTONIX) 40 MG tablet, Take 1 tablet (40 mg total) by mouth daily., Disp:  30 tablet, Rfl: 5 .  phenylephrine (SUDAFED PE) 10 MG TABS tablet, Take 10 mg by mouth every 4 (four) hours as needed., Disp: , Rfl:  .  pimecrolimus (ELIDEL) 1 % cream, Apply topically 2 (two) times daily., Disp: 30 g, Rfl: 2 .  Polyethylene Glycol 3350 (MIRALAX PO), Take by mouth as needed., Disp: , Rfl:  .  Prasterone, DHEA, 25 MG CAPS, Take 2 mg by mouth daily., Disp: , Rfl:  .  Probiotic Product (PROBIOTIC PO), Take by mouth daily., Disp: , Rfl:  .  Simethicone (GAS-X PO), Take by mouth., Disp: , Rfl:  .  tizanidine (ZANAFLEX) 2 MG capsule, Take 1-2 capsules (2-4 mg total) by mouth 3 (three) times daily as needed for muscle spasms., Disp: 60 capsule, Rfl: 2 .  traMADol (ULTRAM) 50 MG tablet, Take 1 tablet (50 mg total) by mouth every 12 (twelve) hours as needed., Disp: 30 tablet, Rfl: 1 .  triamcinolone cream (KENALOG) 0.1 %, Apply 1 application topically 2 (two) times daily., Disp: 80 g, Rfl: 1 .  UNABLE TO FIND, Take 1 capsule by mouth daily. Dv3- vitamin D 3 plus immune support, Disp: , Rfl:   Dg Wrfm Dexa  Result Date: 11/07/2018 EXAM: DUAL X-RAY ABSORPTIOMETRY (DXA) FOR BONE MINERAL DENSITY IMPRESSION: Trend: L1-L4 Measured Date Measured Age BMD Change vs Baseline (%) Change vs Baseline (%/yr) (g/cm2) 09/25/2018 67.8 0.641 -18.8 * -1.5 * 07/27/2015 64.6 0.667 -15.5 * -1.6 * 06/02/2010 59.5 0.738 -6.5 * -1.5 * 03/06/2006    55.2         0.789   baseline                 baseline Trend: Total Left Measured Date Measured Age BMD Change vs Baseline (%) Change vs Baseline (%/yr) (g/cm2) 09/25/2018 67.8 0.476 -22.3 * -1.8 * 08/08/2017 66.7 0.508 -17.1 * -1.5 * 07/27/2015 64.6 0.543 -11.4 * -1.2 * 01/30/2013 62.1 0.574 -6.4 * -0.9 * 06/02/2010    59.5         0.627   2.3                      0.5 03/06/2006    55.2         0.613   baseline                 baseline Trend: Total Right Measured Date Measured Age BMD Change vs Baseline (%) Change vs Baseline (%/yr) (g/cm2) 09/25/2018 67.8 0.457 -18.5 *  -1.5 * 08/08/2017 66.7 0.405 -27.8 * -2.4 * 07/27/2015 64.6 0.434 -22.6 * -2.4 * 01/30/2013 62.1 0.452 -19.4 * -2.8 * 06/02/2010    59.5         0.558   -0.5                     -  0.1 03/06/2006    55.2         0.561   baseline                 baseline Patient: [name] Referring Physician: [name] Birth Date: 1950-11-01 [age]:       [age] Patient ID: [id number] Height: 69.0 in. Weight: 107.0 lbs. Measured: 09/25/2018 2:40:34 PM (17 SP 1) Sex: Female Ethnicity: White Analyzed: 09/25/2018 2:43:33 PM (17 SP 1) AP Spine Bone Density Densitometry: [place] (Combined NHANES/Lunar) Region BMD     YA      AM (g/cm2) T-score Z-score L1    0.527 -5.0 -2.8 L2    0.554 -5.4 -3.2 L3    0.816 -3.2 -1.0 L4    0.666 -4.3 -2.1 L1-L2 0.539 -5.2 -3.1 L1-L3 0.633 -4.5 -2.3 L1-L4 0.641 -4.5 -2.3 L2-L3 0.690 -4.3 -2.1 L2-L4 0.682 -4.3 -2.1 L3-L4 0.740 -3.8 -1.6 Densitometry Trend: L1-L4 Measured Date Age BMD Change vs Baseline (%) Change vs Baseline (%/yr) (years) (g/cm2) 09/25/2018 67.8 0.641   -18.8    * -1.5     * 07/27/2015 64.6 0.667   -15.5    * -1.6     * 06/02/2010 59.5 0.738   -6.5     * -1.5     * 03/06/2006 55.2 0.789   baseline   baseline Patient: [name] Referring Physician: [name] Birth Date: [birth year] [age]:       [age] Patient ID: [id number] Height: 69.0 in. Weight: 107.0 lbs. Measured: 09/25/2018 2:40:34 PM (17 SP 1) Sex: Female Ethnicity: White Analyzed: 09/25/2018 2:43:33 PM (17 SP 1) ANCILLARY RESULTS: AP Spine Region BMD     YA  YA      AM  AM      BMC   Area  Width Height (g/cm2) (%) T-score (%) Z-score (g)   (cm2) (cm)  (cm) L1     0.527   47  -5.0    61  -2.8    7.58  14.40 4.4   3.26 L2     0.554   46  -5.4    59  -3.2    7.08  12.78 4.2   3.05 L3     0.816   68  -3.2    87  -1.0    11.34 13.90 4.0   3.48 L4     0.666   57  -4.3    73  -2.1    9.59  14.41 4.1   3.55 L1-L2  0.539   46  -5.2    59  -3.1    14.66 27.18 4.3   6.30 L1-L3  0.633   54  -4.5    70  -2.3    26.00  41.08 4.2   9.79 L1-L4  0.641   55  -4.5    70  -2.3    35.60 55.49 4.2   13.33 L2-L3  0.690   57  -4.3    73  -2.1    18.42 26.68 4.1   6.53 L2-L4  0.682   57  -4.3    73  -2.1    28.01 41.09 4.1   10.07 L3-L4  0.740   62  -3.8    79  -1.6    20.94 28.31 4.0   7.03 Patient: [name] Referring Physician: [name] Birth Date: Aug 01, 1950 [age]:       [age] Patient ID: [id number] Height: 69.0 in.  Weight: 107.0 lbs. Measured: 09/25/2018 2:40:34 PM (17 SP 1) Sex: Female Ethnicity: White Analyzed: 09/25/2018 2:43:33 PM (17 SP 1) Right=111.3 mm  Mean=110.4 mm  Left=109.2 mm AP Spine L1-L4 0.641 -4.5 -2.3 Osteoporosis DualFemur Total Left 0.476 -4.2 -2.5 Osteoporosis Total Right 0.457 -4.4 -2.6 Osteoporosis Total Mean 0.466 -4.3 -2.6 Osteoporosis Total Diff. 0.019 0.1 0.1 N/A Region BMD     YA      AM      WHO Classification (g/cm2) T-score Z-score L1-L4 0.641 -4.5 -2.3 Osteoporosis Total Left  0.476 -4.2 -2.5 Osteoporosis Total Right 0.457 -4.4 -2.6 Osteoporosis Total Mean  0.466 -4.3 -2.6 Osteoporosis Total Diff. 0.019 0.1  0.1  N/A Patient: [name] Referring Physician: [name] Birth Date: 1950-12-01 [age]:       [age] Patient ID: [id number] Height: 69.0 in. Weight: 107.0 lbs. Measured: 09/25/2018 2:40:34 PM (17 SP 1) Sex: Female Ethnicity: White Analyzed: 09/25/2018 2:43:33 PM (17 SP 1) DualFemur Bone Density Densitometry: [place] (Combined NHANES/Lunar) Region BMD     YA      AM (g/cm2) T-score Z-score Neck Left   0.436 -4.3 -2.4 Neck Right  0.550 -3.5 -1.6 Neck Mean   0.493 -3.9 -2.0 Neck Diff.  0.115 0.8  0.8 Total Left  0.476 -4.2 -2.5 Total Right 0.457 -4.4 -2.6 Total Mean  0.466 -4.3 -2.6 Total Diff. 0.019 0.1  0.1 Densitometry Trend: Total Mean Measured Date Age BMD Change vs Baseline (%) Change vs Baseline (%/yr) (years) (g/cm2) 09/25/2018 67.8 0.466 -20.6 * -1.6 * 08/08/2017 66.7 0.457 -22.1 * -1.9 * 07/27/2015 64.6 0.488 -16.9 * -1.8 * 01/30/2013 62.1 0.513 -12.6 * -1.8 *  06/02/2010    59.5    0.592     0.9                      0.2 03/06/2006    55.2    0.587     baseline                 baseline 1. All patients should optimize calcium and vitamin D intake. 2. Consider FDA-approved medical therapies in postmenopausal women and men aged 59 years and older, based on the following: a. A hip or vertebral (clinical or morphometric) fracture b. T-score <-2.5 at the femoral neck or spine after appropriate evaluation to exclude secondary causes c. Low bone mass (T-score between -1.0 and-2.5 at the femoral neck or spine) and a 10-year probability of hip fracutre >3% or a 10-year probability of a major osteoporosis-related fracture >20% based on the [place]-adapted WHO algorithm d. Clinician judgment and/or patient preferences may indicate treatment for people with 10-year fracture probabilities abocve or below t these levels. ANCILLARY RESULTS: DualFemur Region           BMD     YA  YA      AM  AM      BMC   Area (g/cm2) (%) T-score (%) Z-score (g)   (cm2) Neck Left        0.436   42  -4.3    57  -2.4    2.57  5.89 Neck Right       0.550   53  -3.5    72  -1.6    2.65  4.81 Neck Mean        0.493   48  -3.9    64  -2.0    2.61  5.35 Neck Diff.  0.115   11  0.8     15  0.8     0.08  1.09 Upper Neck Left  0.242   29  -4.8    41  -2.9    0.69  2.86 Upper Neck Right 0.429   52  -3.3    73  -1.3    1.02  2.37 Upper Neck Mean  0.335   41  -4.1    57  -2.1    0.85  2.61 Upper Neck Diff. 0.188   23  1.6     32  1.6     0.33  0.49 Lower Neck Left  0.619   -   -       -   -       1.88  3.03 Lower Neck Right 0.668   -   -       -   -       1.63  2.44 Lower Neck Mean  0.644   -   -       -   -       1.75  2.73 Lower Neck Diff. 0.049   -   -       -   -       0.25  0.59 Wards Left       0.252   28  -5.1    43  -2.6    0.97  3.86 Wards Right      0.313   34  -4.6    53  -2.1    0.80  2.57 Wards Mean       0.283   31  -4.8    48  -2.4    0.89  3.21 Wards Diff.      0.061   7   0.5     10  0.5     0.17  1.29  Troch Left       0.391   46  -4.0    60  -2.3    5.12  13.10 Troch Right      0.348   41  -4.4    53  -2.7    5.24  15.05 Troch Mean       0.369   43  -4.2    56  -2.5    5.18  14.08 Troch Diff.      0.043   5   0.4     7   0.4     0.12  1.96 Shaft Left       0.557   -   -       -   -       9.29  16.69 Shaft Right      0.537   -   -       -   -       7.94  14.78 Shaft Mean       0.547   -   -       -   -       8.62  15.73 Shaft Diff.      0.019   -   -       -   -       1.34  1.91 Total Left       0.476   47  -4.2    60  -2.5    16.97 35.68 Total Right  0.457   45  -4.4    58  -2.6    15.83 34.64 Total Mean       0.466   46  -4.3    59  -2.6    16.40 35.16 Total Diff.      0.019   2   0.1     2   0.1     1.15  1.04 Patient: [name] Referring Physician: [name] Birth Date: [birth year] Age:       [age] Patient ID: [id number] Height: 69.0 in. Weight: 107.0 lbs. Measured: 09/25/2018 2:40:34 PM (17 SP 1) Sex: Female Ethnicity: White Analyzed: 09/25/2018 2:43:33 PM (17 SP 1) FRAX* 10-year Probability of Fracture Based on femoral neck BMD: DualFemur (Left) Major Osteoporotic Fracture: - Hip Fracture:                - Population:                  (none) Risk Factors:                History of Fracture (Adult) *FRAX is a [place] [employer] for Metabolic Bone Disease, a [place] (WHO) [facility]. Patient: [name] Referring Physician: [name] Birth Date: 01/29/1951 Age:       [age] Patient ID: [id number] Height: 69.0 in. Weight: 107.0 lbs. Measured: 09/25/2018 2:40:34 PM (17 SP 1) Sex: Female Ethnicity: White Analyzed: 09/25/2018 2:43:33 PM (17 SP 1) Left Femur Bone Density Left=109.2 mm  Mean=110.4 mm Densitometry: [place] (Combined NHANES/Lunar) Region BMD     YA      AM (g/cm2) T-score Z-score Neck Left  0.436 -4.3 -2.4 Total Left 0.476 -4.2 -2.5 Densitometry Trend: Total Left Measured Date Age BMD Change  vs Baseline (%) Change vs Baseline (%/yr) (years) (g/cm2) 09/25/2018 67.8 0.476   -22.3    * -1.8     * 08/08/2017 66.7 0.508   -17.1    * -1.5     * 07/27/2015 64.6 0.543   -11.4    * -1.2     * 01/30/2013 [age] 0.574   -6.4     * -0.9     * 06/02/2010 59.5 0.627   2.3        0.5 03/06/2006 [age] 0.613   baseline   baseline Patient: [name] Referring Physician: [name] Birth Date: 22-Oct-1950 Age:       [age] Patient ID: [id number] Height: 69.0 in. Weight: 107.0 lbs. Measured: 09/25/2018 2:40:34 PM (17 SP 1) Sex: Female Ethnicity: White Analyzed: 09/25/2018 2:43:33 PM (17 SP 1) Left Forearm Bone Density Densitometry: Region BMD (g/cm2) Densitometry: Region BMD (g/cm2) Patient: [name] Referring Physician: [name] Birth Date: [birth year] Age:       [age] Patient ID: [id number] Height: 69.0 in. Weight: 107.0 lbs. Measured: 09/25/2018 2:40:34 PM (17 SP 1) Sex: Female Ethnicity: White Analyzed: 09/25/2018 2:43:33 PM (17 SP 1) Right Femur Bone Density Right=111.3 mm  Mean=110.4 mm Densitometry: [place] (Combined NHANES/Lunar) Region BMD     YA      AM (g/cm2) T-score Z-score Neck Right  0.550 -3.5 -1.6 Total Right 0.457 -4.4 -2.6 Densitometry Trend: Total Right Measured Date Age BMD Change vs Baseline (%) Change vs Baseline (%/yr) (years) (g/cm2) 09/25/2018 67.8 0.457   -18.5    * -1.5     * 08/08/2017 66.7 0.405   -27.8    * -  2.4     * 07/27/2015 64.6 0.434   -22.6    * -2.4     * 01/30/2013 [age] 0.452   -19.4    * -2.8     * 06/02/2010 59.5 0.558   -0.5       -0.1 03/06/2006 55.2 0.561   baseline   baseline Patient: [name] Referring Physician: [name] Birth Date: October 27, 1950 Age:       [age] years Patient ID: [id number] Height: 69.0 in. Weight: 107.0 lbs. Measured: 09/25/2018 2:40:34 PM (17 SP 1) Sex: Female Ethnicity: White Analyzed: 09/25/2018 2:43:33 PM (17 SP 1) Right Forearm Bone Density Densitometry: Region BMD (g/cm2) Densitometry: Region BMD (g/cm2)  Patient: [name] Referring Physician: [name] Birth Date: 1951-02-26 Age:       [age] Patient ID: [id number] Height: 69.0 in. Weight: 107.0 lbs. Measured: 09/25/2018 2:40:34 PM (17 SP 1) Sex: Female Ethnicity: White Analyzed: 09/25/2018 2:43:33 PM (17 SP 1) Total Body Custom Results Custom Densitometry Region BMD     [facility] Area (g/cm2) (g) (cm2) Your patient ILEY SCHIMPF completed a FRAX assessment on 09/25/2018 using the [place] (analysis version: 17 SP 1) manufactured by [employer]. The following summarizes the results of our evaluation. PATIENT BIOGRAPHICAL: Name: Niani, Harpring Patient ID: [id number] Birth Date: 1950/03/29 Height:    69.0 in. Gender:     Female    [age]:        [age]       Weight:    107.0 lbs. Ethnicity:  White                            Exam Date: 09/25/2018 FRAX* RESULTS:  (version: -) 10-year Probability of Fracture1 Major Osteoporotic Fracture2 Hip Fracture -                            - Population:                  (none) Risk Factors:                History of Fracture (Adult) Based on DualFemur (Left) Neck BMD 1 -The 10-year probability of fracture may be lower than reported if the patient has received treatment. 2 -Major Osteoporotic Fracture: Clinical Spine, Forearm, Hip or Shoulder *FRAX is a Materials engineer of the [employer] of [employer] for Metabolic Bone Disease, a [place] (WHO) [facility]. ASSESSMENT: The probability of a major osteoporotic fracture is - within the next ten years The probability of a hip fracture is  -   within the next ten years [facility]  Radiology Your patient Jamilett Freehill completed a BMD test on 09/25/2018 using the [place] (software version: 17 SP 1) manufactured by [employer]. The following summarizes the results of our evaluation.Tech: PATIENT BIOGRAPHICAL: Name: Gloristine, Kosta Patient ID: [id number] Birth Date:  07-Dec-1950 Height: 69.0 in. Gender: Female Exam Date: 09/25/2018 Weight: 107.0 lbs. Indications: Bilateral Ovariectomy, Caucasian, Family History of Fracture, FAMILY HX OF OSTEOPOROSIS, History of Fracture (Adult), Low Body Weight, Osteoporotic, Post Menopausal Fractures: CLAVICLE, Pelvis Treatments: Calcium, Vitamin D DENSITOMETRY RESULTS: Site Region Meas'd Date Meas'd Age WHO Class. YA T-score BMD %Chg Prev. Sig. Chg (*) AP Spine L1-L4 09/25/2018 [age] years Osteoporosis -4.5 0.641 g/cm2 -3.9% AP Spine L1-L4 07/27/2015 64.6 [age] Osteoporosis -  4.3 0.667 g/cm2 -9.6% * AP Spine L1-L4 06/02/2010 [age] years Osteoporosis -3.7 0.738 g/cm2 -6.5% * AP Spine L1-L4 03/06/2006 [age] years Osteoporosis -3.3 0.789 g/cm2 - DualFemur Neck Left 09/25/2018 [age] years Osteoporosis -4.3 0.436 g/cm2 -21.6% * DualFemur Neck Left 08/08/2017 [age] years Osteoporosis -3.5 0.556 g/cm2 -5.6% DualFemur Neck Left 07/27/2015 64.6 years Osteoporosis -3.2 0.589 g/cm2 -7.8% * DualFemur Neck Left 01/30/2013 [age] Osteoporosis -2.9 0.639 g/cm2 -1.4% DualFemur Neck Left 06/02/2010 59.5 years Osteoporosis -2.8 0.648 g/cm2 0.8% DualFemur Neck Left 03/06/2006 [age] years Osteoporosis -2.8 0.643 g/cm2 - - ASSESSMENT: The BMD measured at AP Spine L1-L4 is 0.641 g/cm2 with a T-score of -4.5. This patient is considered osteoporotic according to [facility]) criteria. Scan quality good. - World Health Organization [facility]) criteria for post-menopausal, Caucasian Women: Normal:       T-score at or above -1 SD Osteopenia:   T-score between -1 and -2.5 SD Osteoporosis: T-score at or below -2.5 SD - RECOMMENDATIONS: 1. All patients should optimize calcium and vitamin D intake. 2. Consider FDA-approved medical therapies in postmenopausal women and men aged 21 years and older, based on the following: a. Hip or vertebral (clinical or morphometric ) fracture. b. T-score < -2.5 at the femoral neck or spine after appropriate evaluation to exclude  secondary causes c.Low bone mass (T-score between -1.0 and -2.5 at the femoral neck or spine) and a 10 -year probability of a hip fracture > 3% or a 10- year probability of a major osteoporosis- related fracture>20% based on the [place]-adapted WHO algorithm d. Clinician judgment and/or patient preferences may inidcate treatment for people with 10 -year fracture probabilities above or below these levels. - FOLLOW-UP: People with diagnosed cases of osteoporosis or at high risk for fracture should have regular bone mineral density tests. For patients eligible for Medicare, routine testing is allowed once every 2 years. The testing frequency can be increased to one year for patients who have rapidly progressing disease, those who are receiving or discontinuing medical therapy to restore bone mass, or have additional risk factors. I have reviewed this report, and agree with the above findings. [facility] Radiology - I have reviewed this report, and agree with the above findings. [facility] Radiology Electronically Signed   By: Lowella Grip III M.D.   On: 11/07/2018 09:39    Assessment/ Plan: 68 y.o. female   1. Age-related osteoporosis without current pathological fracture I reviewed her DEXA scan results with the patient.  We discussed avoidance of chiropractic adjustments for now given increased risk of osteoporotic fracture.  I have discussed further with her bisphosphonates versus Prolia versus evenity, which I think she would be a candidate for given severe osteoporosis.  Her vitamin D and calcium levels are appropriate as of June and she would be a candidate for starting any of these medications.  If she decides to pursue bisphosphonates would consider Boniva 150 mg/month.  If she finds that reflux is too exacerbated by bisphosphonates, would then consider evenity.  I have also placed a referral to Dr. Layne Benton with [facility] for further discussion of osteoporosis.  This patient wants to be well informed  of all options and would like more information.   - Ambulatory referral to Orthopedic Surgery  2. Chronic jaw pain Patient with rare use of tramadol.  Refill sent.  National narcotic database was reviewed and there were no red flags. - traMADol (ULTRAM) 50 MG tablet; Take 1 tablet (50 mg total) by mouth every 12 (twelve) hours  as needed.  Dispense: 30 tablet; Refill: 1  3. Generalized anxiety disorder Rare use of Ativan.  Has on hand for as needed severe anxiety.  Last refill was January 2020.   - LORazepam (ATIVAN) 0.5 MG tablet; Take 1 tablet (0.5 mg total) by mouth 2 (two) times daily as needed for anxiety.  Dispense: 30 tablet; Refill: 0  4. Seasonal allergic rhinitis due to pollen Astelin renewed.  Continue other antihistamine treatments.  Start time: 12:51pm End time: 1:09pm  Total time spent on patient care (including telephone call/ virtual visit): 22 minutes  Follansbee, Ferdinand [phone]

## 2019-01-07 ENCOUNTER — Ambulatory Visit (INDEPENDENT_AMBULATORY_CARE_PROVIDER_SITE_OTHER): Payer: Medicare HMO | Admitting: Licensed Clinical Social Worker

## 2019-01-07 DIAGNOSIS — E440 Moderate protein-calorie malnutrition: Secondary | ICD-10-CM

## 2019-01-07 DIAGNOSIS — R5383 Other fatigue: Secondary | ICD-10-CM

## 2019-01-07 DIAGNOSIS — D649 Anemia, unspecified: Secondary | ICD-10-CM

## 2019-01-07 DIAGNOSIS — F411 Generalized anxiety disorder: Secondary | ICD-10-CM

## 2019-01-07 DIAGNOSIS — K219 Gastro-esophageal reflux disease without esophagitis: Secondary | ICD-10-CM

## 2019-01-07 DIAGNOSIS — R519 Headache, unspecified: Secondary | ICD-10-CM

## 2019-01-07 NOTE — Patient Instructions (Addendum)
Licensed Clinical Social Worker Visit Information  Goals we discussed today:  Goals        . Client said she wants to talk with someone about her health issues and how she deals with them (pt-stated)     Current Barriers:  . Limited access to food . Mental Health Concerns   . Pain issues  Clinical Social Work Clinical Goal(s):  [name] Over the next 30 day days, client will work with LCSW to address concerns related to health issues of client and how she deals with health issues  Interventions: . Encouraged client to talk with RN CM about nursing needs of client . Provided counseling support for client . Talked with client about health issues of concern  . Talked with client about relaxation techniques of choice for client   Patient Self Care Activities:  . Attends all scheduled provider appointments  . Self administers prescribed medications . Calls provider office for new concerns or questions  Plan: Patient to contact LCSW to discuss psychosocial needs of cllient LCSW will call client in 3 weeks to discuss health needs of client  and client management of health needs Client to attend scheduled medical appointments Client to communicate with RN CM to discuss nursing needs of client  *Initial goal documentation            Materials Provided: No  Follow Up Plan: LCSW will call client in next 3 weeks to discuss health needs of client and client management of health needs  The patient verbalized understanding of instructions provided today and declined a print copy of patient instruction materials.   [name].Bryann Gentz MSW, LCSW Licensed Clinical Social Worker [facility] Care Management [phone]

## 2019-01-07 NOTE — Chronic Care Management (AMB) (Signed)
Care Management Note   Kristy Garza is a 68 y.o. year old female who is a primary care patient of [name], DO. The CM team was consulted for assistance with chronic disease management and care coordination.   I reached out to [name] by phone today.     Review of patient status, including review of consultants reports, relevant laboratory and other test results, and collaboration with appropriate care team members and the patient's provider was performed as part of comprehensive patient evaluation and provision of chronic care management services.   Social Determinants of health:risk of social isolation; risk of physical inactivity    Office Visit from 08/28/2018 in [place]  PHQ-9 Total Score  0     GAD 7 : Generalized Anxiety Score 08/28/2018 05/23/2018 04/11/2018  Nervous, Anxious, on Edge 0 3 1  Control/stop worrying 0 2 0  Worry too much - different things 0 2 0  Trouble relaxing 0 2 0  Restless 0 1 0  Easily annoyed or irritable 0 1 0  Afraid - awful might happen 0 1 0  Total GAD 7 Score 0 12 1   Medications   New medications from outside sources are available for reconciliation   acetaminophen (TYLENOL) 500 MG tablet    albuterol (PROVENTIL HFA;VENTOLIN HFA) 108 (90 Base) MCG/ACT inhaler    azelastine (ASTELIN) 0.1 % nasal spray    b complex vitamins capsule    calcium citrate-vitamin D (CITRACAL+D) 315-200 MG-UNIT tablet    cetirizine HCl (ZYRTEC) 5 MG/5ML SOLN    dextromethorphan-guaiFENesin (MUCINEX DM) 30-600 MG 12hr tablet    Ergocalciferol (VITAMIN D2) 2000 units TABS    Fluticasone Furoate (ARNUITY ELLIPTA) 200 MCG/ACT AEPB    Halcinonide 0.1 % CREA    hydrOXYzine (VISTARIL) 25 MG capsule    hyoscyamine (LEVSIN SL) 0.125 MG SL tablet    lidocaine (XYLOCAINE) 2 % jelly    LORazepam (ATIVAN) 0.5 MG tablet    mometasone (NASONEX) 50 MCG/ACT nasal spray    pantoprazole (PROTONIX) 40 MG tablet    phenylephrine (SUDAFED  PE) 10 MG TABS tablet    pimecrolimus (ELIDEL) 1 % cream    Polyethylene Glycol 3350 (MIRALAX PO)    Prasterone, DHEA, 25 MG CAPS    Probiotic Product (PROBIOTIC PO)    Simethicone (GAS-X PO)    tizanidine (ZANAFLEX) 2 MG capsule    traMADol (ULTRAM) 50 MG tablet    triamcinolone cream (KENALOG) 0.1 %     Goals        . Client said she wants to talk with someone about her health issues and how she deals with them (pt-stated)     Current Barriers:  . Limited access to food . Mental Health Concerns   . Pain issues  Clinical Social Work Clinical Goal(s):  [name] Over the next 30 day days, client will work with LCSW to address concerns related to health issues of client and how she deals with health issues  Interventions: . Encouraged client to talk with RN CM about nursing needs of client . Provided counseling support for client . Talked with client about health issues of concern  .  Talked with client about relaxation techniques of choice for client   Patient Self Care Activities:  . Attends all scheduled provider appointments  . Self administers prescribed medications . Calls provider office for new concerns or questions  Plan: Patient to contact LCSW to discuss psychosocial needs of cllient LCSW  will call client in 3 weeks to discuss health needs of client  and client management of health needs Client to attend scheduled medical appointments Client to communicate with RN CM to discuss nursing needs of client  *Initial goal documentation     Follow Up Plan: LCSW will call client in next 3 weeks to talk with client about health needs of client and management of client health needs  [name].Foy Vanduyne MSW, LCSW Licensed Clinical Social Worker [facility] Care Management [phone]

## 2019-01-11 ENCOUNTER — Telehealth: Payer: Self-pay | Admitting: Allergy and Immunology

## 2019-01-11 DIAGNOSIS — G44201 Tension-type headache, unspecified, intractable: Secondary | ICD-10-CM | POA: Diagnosis not present

## 2019-01-11 DIAGNOSIS — J0100 Acute maxillary sinusitis, unspecified: Secondary | ICD-10-CM | POA: Diagnosis not present

## 2019-01-11 NOTE — Telephone Encounter (Signed)
Call to patient.  No answer.  Left message to call clinic back.

## 2019-01-11 NOTE — Telephone Encounter (Signed)
Patient called stating she has had headaches, congestion and coughing for 3 weeks. Her flow meter breathing machine read 350 and patient states her normal should be 400. Patient has been using Mucinex DM, Allegra, and Ventolin but still not getting any relief.   Patient has an appointment 02/12/2019 but would like to know recommendations on different medications to get relief or if she should be seen sooner.  Please advise.

## 2019-01-13 MED ORDER — AMOXICILLIN-POT CLAVULANATE 875-125 MG PO TABS: 1.0000 | ORAL_TABLET | Freq: Two times a day (BID) | ORAL | 0 refills | Status: AC

## 2019-01-13 NOTE — Telephone Encounter (Signed)
Patient called to let me know that she was recently diagnosed with sinusitis. She has had symptoms for 17 days total. She did get tested for COVID19 and was negative. Regardless, she was placed on steroids and azithromycin. She was on Nasonex and Astelin. She was told to stop these and take generic ipratropium. She has been having a lot of congestion and she has been taking Allegra and phenylephrine. She is using Mucinex 12 hour formulation. She is not allergic to penicillin and has tolerated Augmentin in the past. She has been using [employer].   I recommended stopping the azithromycin since there was not improvement in 3 days and started Augmentin instead. Confirmed pharmacy with the patient. I also recommended adding on Afrin BID for up to three days and increasing yogurt intake to prevent antibiotic-associated diarrhea.  We will call her tomorrow to see how she is feeling.   Salvatore Marvel, MD [facility] [place]

## 2019-01-14 NOTE — Telephone Encounter (Signed)
Patient states she is still congested Peak flow patient states she is registering around 350, coughing is controlled by her mucinex and states started headache during her recent sx. Congestion is the major issue, no signs of flu like syptoms and stated she stoped the Azithromycin and started the Augmentin this AM. Patient states she is still feeling pretty much the same. Has not started th Afrin will attempt to try the Afrin for a few days to see it helps. Patient will call back if sx don't improve.

## 2019-01-15 ENCOUNTER — Ambulatory Visit: Payer: Medicare HMO | Admitting: *Deleted

## 2019-01-15 DIAGNOSIS — J309 Allergic rhinitis, unspecified: Secondary | ICD-10-CM

## 2019-01-15 DIAGNOSIS — M810 Age-related osteoporosis without current pathological fracture: Secondary | ICD-10-CM

## 2019-01-15 NOTE — Patient Instructions (Signed)
Visit Information  Goals Addressed            This Visit's Progress     Patient Stated   . Allergy & Chronic Sinusitis Management (pt-stated)       "I would like to get better control of my allergies and prevent recurrent sinusitis."  Current Barriers:  . Chronic disease management and support related to allergies and chronic sinusitis  Nurse Case Manager Clinical Goal(s):  [name] Over the next 30 days, patient will see improvement in current sinusitis symptoms and improved management of allergies.   Interventions:  . Evaluation of current treatment plan related to allergies/sinusitis and patient's adherence to plan as established by provider. . Reviewed medications with patient and discussed current use of Augmentin and Afrin prescribed by allergist. Reiterated to only use Afrin for 3 days as prescribed due to rebound congestion.  . Discussed plans with patient for ongoing care management follow up and provided patient with direct contact information for care management team  . Encouraged patient to f/u with allergist if symptoms worsen or fail to resolve  Patient Self Care Activities:  . Performs ADL's independently . Performs IADL's independently  Please see past updates related to this goal by clicking on the "Past Updates" button in the selected goal       . Osteoporosis Management (pt-stated)       Current Barriers:  . Chronic Disease Management support and education needs related to osteoporosis  Nurse Case Manager Clinical Goal(s):  [name] Over the next 30 days, patient will attend all scheduled medical appointments: Dr Layne Benton 01/21/2019  . Over the next 30 days, patient will verbalize basic understanding of osteoporosis disease process and self health management plan as evidenced by deciding on a treatment plan with her provider.  Interventions:  . Evaluation of current treatment plan related to osteporosis and patient's adherence to plan as established by  provider. . Provided education to patient re: osteoporosis disease process and management . Reviewed medications with patient and discussed prior treatment with Evista and potential treatment with Jaclyn Prime, Prolia, and Evenity which were mentioned by her PCP. I also discussed Reclast. . Discussed plans with patient for ongoing care management follow up and provided patient with direct contact information for care management team . Chart reviewed including relevant office notes and current and past Dexa Scan results . Encouraged patient to keep appt with Dr Layne Benton. She was referred to her last year to discuss osteoporosis management but the appointment was never made.   Patient Self Care Activities:  . Performs ADL's independently . Performs IADL's independently . Unable to independently N/A  Initial goal documentation        The patient verbalized understanding of instructions provided today and declined a print copy of patient instruction materials.   The care management team will reach out to the patient again over the next 30 days.   Chong Sicilian, BSN, RN-BC Embedded Chronic Care Manager [facility] / [place] [facility] Direct Dial: [phone]

## 2019-01-15 NOTE — Chronic Care Management (AMB) (Signed)
Chronic Care Management   Follow Up Note   01/15/2019 Name: Kristy Garza MRN: [id number] DOB: [birth year]  Referred by: [name], DO Reason for referral : Chronic Care Management (RN CCM follow up)   Kristy Garza is a 68 y.o. year old female who is a primary care patient of [name], DO. The CCM team was consulted for assistance with chronic disease management and care coordination needs.    Review of patient status, including review of consultants reports, relevant laboratory and other test results, and collaboration with appropriate care team members and the patient's provider was performed as part of comprehensive patient evaluation and provision of chronic care management services.    SDOH (Social Determinants of Health) screening performed today: Physical Activity. See Care Plan for related entries.    Outpatient Encounter Medications as of 01/15/2019  Medication Sig Note  . acetaminophen (TYLENOL) 500 MG tablet Take by mouth. 01/07/2016: Received from: [facility] Received Sig: Take by mouth.  [name] albuterol (PROVENTIL HFA;VENTOLIN HFA) 108 (90 Base) MCG/ACT inhaler Inhale 2 puffs into the lungs every 4 (four) hours as needed for wheezing or shortness of breath.   [name] amoxicillin-clavulanate (AUGMENTIN) 875-125 MG tablet Take 1 tablet by mouth 2 (two) times daily for 10 days.   [name] azelastine (ASTELIN) 0.1 % nasal spray Place 2 sprays into both nostrils 2 (two) times daily. Use in each nostril as directed   . b complex vitamins capsule Take 1 capsule by mouth daily.   . calcium citrate-vitamin D (CITRACAL+D) 315-200 MG-UNIT tablet Take 2 tablets by mouth 2 (two) times daily.   . cetirizine HCl (ZYRTEC) 5 MG/5ML SOLN Take 5 mg by mouth daily.   [name] dextromethorphan-guaiFENesin (MUCINEX DM) 30-600 MG 12hr tablet Take 1 tablet by mouth 2 (two) times daily.   . Ergocalciferol (VITAMIN D2) 2000 units TABS Take 2,000 Units by mouth daily.   .  Fluticasone Furoate (ARNUITY ELLIPTA) 200 MCG/ACT AEPB Inhale 1 puff into the lungs daily.   . Halcinonide 0.1 % CREA Apply thin layer to skin twice daily as needed   . hydrOXYzine (VISTARIL) 25 MG capsule Take 1 tablet by mouth at bedtime as needed for itch   . hyoscyamine (LEVSIN SL) 0.125 MG SL tablet PLEASE SEE ATTACHED FOR DETAILED DIRECTIONS   . lidocaine (XYLOCAINE) 2 % jelly Apply topically as needed.   [name] LORazepam (ATIVAN) 0.5 MG tablet Take 1 tablet (0.5 mg total) by mouth 2 (two) times daily as needed for anxiety.   . mometasone (NASONEX) 50 MCG/ACT nasal spray PLACE 2 SPRAYS INTO THE NOSE DAILY.   . pantoprazole (PROTONIX) 40 MG tablet Take 1 tablet (40 mg total) by mouth daily.   . phenylephrine (SUDAFED PE) 10 MG TABS tablet Take 10 mg by mouth every 4 (four) hours as needed.   . pimecrolimus (ELIDEL) 1 % cream Apply topically 2 (two) times daily.   . Polyethylene Glycol 3350 (MIRALAX PO) Take by mouth as needed.   . Prasterone, DHEA, 25 MG CAPS Take 2 mg by mouth daily.   . Probiotic Product (PROBIOTIC PO) Take by mouth daily.   . Simethicone (GAS-X PO) Take by mouth.   . tizanidine (ZANAFLEX) 2 MG capsule Take 1-2 capsules (2-4 mg total) by mouth 3 (three) times daily as needed for muscle spasms.   . traMADol (ULTRAM) 50 MG tablet Take 1 tablet (50 mg total) by mouth every 12 (twelve) hours as needed.   . triamcinolone  cream (KENALOG) 0.1 % Apply 1 application topically 2 (two) times daily.   [name] UNABLE TO FIND Take 1 capsule by mouth daily. Dv3- vitamin D 3 plus immune support    No facility-administered encounter medications on file as of 01/15/2019.    Goals Addressed            This Visit's Progress     Patient Stated   . Allergy & Chronic Sinusitis Management (pt-stated)       "I would like to get better control of my allergies and prevent recurrent sinusitis."  Current Barriers:  . Chronic disease management and support related to allergies and chronic sinusitis   Nurse Case Manager Clinical Goal(s):  [name] Over the next 30 days, patient will see improvement in current sinusitis symptoms and improved management of allergies.   Interventions:  . Evaluation of current treatment plan related to allergies/sinusitis and patient's adherence to plan as established by provider. . Reviewed medications with patient and discussed current use of Augmentin and Afrin prescribed by allergist. Reiterated to only use Afrin for 3 days as prescribed due to rebound congestion.  . Discussed plans with patient for ongoing care management follow up and provided patient with direct contact information for care management team  . Encouraged patient to f/u with allergist if symptoms worsen or fail to resolve  Patient Self Care Activities:  . Performs ADL's independently . Performs IADL's independently  Please see past updates related to this goal by clicking on the "Past Updates" button in the selected goal       . Osteoporosis Management (pt-stated)       Current Barriers:  . Chronic Disease Management support and education needs related to osteoporosis  Nurse Case Manager Clinical Goal(s):  [name] Over the next 30 days, patient will attend all scheduled medical appointments: Dr Layne Benton 01/21/2019  . Over the next 30 days, patient will verbalize basic understanding of osteoporosis disease process and self health management plan as evidenced by deciding on a treatment plan with her provider.  Interventions:  . Evaluation of current treatment plan related to osteporosis and patient's adherence to plan as established by provider. . Provided education to patient re: osteoporosis disease process and management . Reviewed medications with patient and discussed prior treatment with Evista and potential treatment with Jaclyn Prime, Prolia, and Evenity which were mentioned by her PCP. I also discussed Reclast. . Discussed plans with patient for ongoing care management follow up and provided  patient with direct contact information for care management team . Chart reviewed including relevant office notes and current and past Dexa Scan results . Encouraged patient to keep appt with Dr Layne Benton. She was referred to her last year to discuss osteoporosis management but the appointment was never made.   Patient Self Care Activities:  . Performs ADL's independently . Performs IADL's independently . Unable to independently N/A  Initial goal documentation         The care management team will reach out to the patient again over the next 30 days.    Chong Sicilian, BSN, RN-BC Embedded Chronic Care Manager [facility] / [place] [facility] Direct Dial: [phone]

## 2019-01-18 ENCOUNTER — Other Ambulatory Visit: Payer: Self-pay | Admitting: *Deleted

## 2019-01-18 ENCOUNTER — Telehealth: Payer: Self-pay | Admitting: Allergy & Immunology

## 2019-01-18 MED ORDER — DOXYCYCLINE MONOHYDRATE 100 MG PO TABS: 100.0000 mg | ORAL_TABLET | Freq: Two times a day (BID) | ORAL | 0 refills | Status: AC

## 2019-01-18 NOTE — Telephone Encounter (Signed)
Patient called stating that she cannot take the amoxicillin-clavulanate Dr. Ernst Bowler prescribed because it is giving her diarrhea. Patient states she took the medication Monday through Thursday.   Patient also states that [place] informs to not take with magnesium, but patient forgot and took a medication which has magnesium in it. Patient would like to know the side effects of this.   Please advise.

## 2019-01-18 NOTE — Telephone Encounter (Signed)
Diarrhea is a well known side effect of the medication and one I warned her about. We can change to doxycycline twice daily, which she needs to take with food.  I don't think the Allegra/magnesium interaction will be an issue since her renal function was normal in June 2020. Please provide reassurance.    Salvatore Marvel, MD Allergy and [place] [facility] [place]

## 2019-01-18 NOTE — Telephone Encounter (Signed)
Patient state the Augmentin was to strong for her causing diarrhea, and sx started out as she stated 1 month ago. Medication at that time didn't give her relief. Currently has stopped her Afrin and will go back to her nasal Flonase and Azelastine. Patient states Amoxicillin should work best for her. She is wanting the CT sinus to make sure she is okay. Please advise.

## 2019-01-18 NOTE — Telephone Encounter (Signed)
Called patient and advised. Doxycycline has been sent to requested pharmacy. Patient verbalized understanding.

## 2019-01-18 NOTE — Telephone Encounter (Signed)
Dr. Gallagher please advise.  

## 2019-01-18 NOTE — Telephone Encounter (Signed)
If she continues to have problems with sinus congestion, we should go ahead and get a sinus CT. Please call patient to check on her.   Salvatore Marvel, MD Allergy and [place] [facility] [place]

## 2019-01-21 DIAGNOSIS — M810 Age-related osteoporosis without current pathological fracture: Secondary | ICD-10-CM | POA: Diagnosis not present

## 2019-01-21 MED ORDER — AMOXICILLIN 875 MG PO TABS: 875.0000 mg | ORAL_TABLET | Freq: Two times a day (BID) | ORAL | 0 refills | Status: AC

## 2019-01-21 NOTE — Addendum Note (Signed)
Addended by: Valentina Shaggy on: 01/21/2019 06:25 AM   Modules accepted: Orders

## 2019-01-21 NOTE — Telephone Encounter (Signed)
Patient was given a antibiotic of Doxycyline on 01/18/2019 for the same reason from the Augmentin and was advised to start that medication. Patient was also given Amoxicillin this AM for the same reason. She was now unsure on what to do since now she has 3 antibiotics. Patient states she is still having sx, and wanted to know if she needs to come in and get this situated due to the confusion. Please advise. I advised her to take a Probiotic since she is taking all these antibiotics

## 2019-01-21 NOTE — Telephone Encounter (Signed)
We will change to amoxicillin 875 mg BID. I will do another 7 days since she has already had some antibiotics.   We cannot get CT approval unless we go through these steps.   Salvatore Marvel, MD Allergy and [place] [place]

## 2019-01-22 NOTE — Telephone Encounter (Signed)
Patient made aware of Dr. Gillermina Hu recommendation. She verbalized understanding. She will keep her upcoming November 17th appointment with Dr. Neldon Garza and let us know if she has any further issues.

## 2019-01-22 NOTE — Telephone Encounter (Signed)
Thanks for the probiotic. We had discussed that a few antibiotic courses ago. Stop the Augmentin and the doxycycline. Just continue with the amoxicillin until it is completed. If she is still having problems, we can get the CT scan ordered since we would have jumped through enough hoops from the insurance company's perspective. Alternatively, she is a [facility] patient and she could make an appointment to see him.  Salvatore Marvel, MD Allergy and [place] [facility] [place]

## 2019-01-25 ENCOUNTER — Ambulatory Visit: Payer: Medicare HMO | Admitting: Allergy

## 2019-01-25 ENCOUNTER — Encounter: Payer: Self-pay | Admitting: Allergy

## 2019-01-25 ENCOUNTER — Other Ambulatory Visit: Payer: Self-pay

## 2019-01-25 ENCOUNTER — Telehealth: Payer: Self-pay

## 2019-01-25 VITALS — BP 102/70 | HR 86 | Temp 98.2°F | Resp 14 | Ht 68.0 in | Wt 108.2 lb

## 2019-01-25 DIAGNOSIS — J324 Chronic pansinusitis: Secondary | ICD-10-CM | POA: Diagnosis not present

## 2019-01-25 DIAGNOSIS — J4530 Mild persistent asthma, uncomplicated: Secondary | ICD-10-CM

## 2019-01-25 NOTE — Progress Notes (Signed)
Follow-up Note  RE: Kristy Garza MRN: [id number] DOB: 07-Sep-1950 Date of Office Visit: 01/25/2019   History of present illness: Kristy Garza is a 68 y.o. female presenting today for sick visit.  She was last seen in the office on December 07, 2018 for a rash she was having.  The rash did not resolve.  She presents today as she has been having long-lasting sinus symptoms.  She states she called the office around October 16 reporting at that time about 17 days of sinus symptoms including frontal headache, sinus pressure, upper teeth pain, nasal congestion, cough and sore throat.  She denies fevers.  She did have a negative COVID-19 testing.  On August 16 she went to an urgent care was given a low-dose steroid injection and azithromycin.  She had been on the azithromycin for about 3 days when she called her office with continued symptoms.  She was able to talk with Dr. Ernst Bowler who told her to stop the azithromycin and prescribed her Augmentin.  Which she states she took about 8 doses however it was causing her to have diarrhea.  That she called the office back and was told to stop the Augmentin and was prescribed amoxicillin however she also states she was prescribed doxycycline and was unsure about what she should be taking.  She states she took doxycycline for 7 doses and then took the amoxicillin for 3 doses.  She still felt that the amoxicillin was too strong and was causing her to have symptoms.  She states she took at least a total of 21 days of antibiotics.  She states she can breathe a little bit better some but she is still having headaches and a cough and sore throat. She states she did use her albuterol earlier this week.  She also states she has Arnuity but only uses this about twice a week on average.  Review of systems: Review of Systems  Constitutional: Negative for chills, fever and malaise/fatigue.  HENT: Positive for congestion, sinus pain and sore throat. Negative for ear  discharge, ear pain and nosebleeds.   Eyes: Negative for pain, discharge and redness.  Respiratory: Positive for cough. Negative for shortness of breath and wheezing.   Cardiovascular: Negative.   Gastrointestinal: Negative.   Musculoskeletal: Negative.   Skin: Negative for itching and rash.  Neurological: Positive for headaches.    All other systems negative unless noted above in HPI  Past medical/social/surgical/family history have been reviewed and are unchanged unless specifically indicated below.  No changes  Medication List: Current Outpatient Medications  Medication Sig Dispense Refill  . acetaminophen (TYLENOL) 500 MG tablet Take by mouth.    [name] albuterol (PROVENTIL HFA;VENTOLIN HFA) 108 (90 Base) MCG/ACT inhaler Inhale 2 puffs into the lungs every 4 (four) hours as needed for wheezing or shortness of breath. 18 g 1  . amoxicillin (AMOXIL) 875 MG tablet Take 1 tablet (875 mg total) by mouth 2 (two) times daily for 7 days. 14 tablet 0  . azelastine (ASTELIN) 0.1 % nasal spray Place 2 sprays into both nostrils 2 (two) times daily. Use in each nostril as directed 30 mL 2  . b complex vitamins capsule Take 1 capsule by mouth daily.    . calcium citrate-vitamin D (CITRACAL+D) 315-200 MG-UNIT tablet Take 2 tablets by mouth 2 (two) times daily.    . cetirizine HCl (ZYRTEC) 5 MG/5ML SOLN Take 5 mg by mouth daily.    [name] dextromethorphan-guaiFENesin (MUCINEX DM) 30-600 MG 12hr  tablet Take 1 tablet by mouth 2 (two) times daily.    [name] doxycycline (ADOXA) 100 MG tablet Take 1 tablet (100 mg total) by mouth 2 (two) times daily for 10 days. 20 tablet 0  . Ergocalciferol (VITAMIN D2) 2000 units TABS Take 2,000 Units by mouth daily.    . Fluticasone Furoate (ARNUITY ELLIPTA) 200 MCG/ACT AEPB Inhale 1 puff into the lungs daily. 30 each 0  . Halcinonide 0.1 % CREA Apply thin layer to skin twice daily as needed 60 g 2  . hydrOXYzine (VISTARIL) 25 MG capsule Take 1 tablet by mouth at bedtime as needed  for itch 30 capsule 3  . hyoscyamine (LEVSIN SL) 0.125 MG SL tablet PLEASE SEE ATTACHED FOR DETAILED DIRECTIONS    . ipratropium (ATROVENT) 0.06 % nasal spray 2 sprays by Each Nare route Three (3) times a day.    . lidocaine (XYLOCAINE) 2 % jelly Apply topically as needed. 30 mL 3  . LORazepam (ATIVAN) 0.5 MG tablet Take 1 tablet (0.5 mg total) by mouth 2 (two) times daily as needed for anxiety. 30 tablet 0  . mometasone (NASONEX) 50 MCG/ACT nasal spray PLACE 2 SPRAYS INTO THE NOSE DAILY. 17 g 3  . pantoprazole (PROTONIX) 40 MG tablet Take 1 tablet (40 mg total) by mouth daily. 30 tablet 5  . phenylephrine (SUDAFED PE) 10 MG TABS tablet Take 10 mg by mouth every 4 (four) hours as needed.    . pimecrolimus (ELIDEL) 1 % cream Apply topically 2 (two) times daily. 30 g 2  . Polyethylene Glycol 3350 (MIRALAX PO) Take by mouth as needed.    . Prasterone, DHEA, 25 MG CAPS Take 2 mg by mouth daily.    . Probiotic Product (PROBIOTIC PO) Take by mouth daily.    . Simethicone (GAS-X PO) Take by mouth.    . tizanidine (ZANAFLEX) 2 MG capsule Take 1-2 capsules (2-4 mg total) by mouth 3 (three) times daily as needed for muscle spasms. 60 capsule 2  . traMADol (ULTRAM) 50 MG tablet Take 1 tablet (50 mg total) by mouth every 12 (twelve) hours as needed. 30 tablet 1  . triamcinolone cream (KENALOG) 0.1 % Apply 1 application topically 2 (two) times daily. 80 g 1  . UNABLE TO FIND Take 1 capsule by mouth daily. Dv3- vitamin D 3 plus immune support     No current facility-administered medications for this visit.      Known medication allergies: Allergies  Allergen Reactions  . Cefuroxime Axetil Other (See Comments)    Extreme gas  . Gabapentin Other (See Comments)    Constipation Constipation  . Montelukast Other (See Comments)    Numbness and tingling in hand. Numbness and tingling in hand. Numbness and tingling in hand. NUMBNESS OF HANDS AND FEET. Numbness in hands and feet NUMBNESS OF HANDS AND  FEET.  . Pregabalin Other (See Comments)    Drowsiness, dry mouth Drowsiness, dry mouth Makes her tired and thirsty  . Augmentin [Amoxicillin-Pot Clavulanate] Diarrhea  . Avelox [Moxifloxacin Hcl In Nacl] Other (See Comments)    Tremors   . Biaxin [Clarithromycin]     Unsure of reaction  . Cedax [Ceftibuten] Other (See Comments)    tremors  . Ciprofloxacin Other (See Comments) and Nausea And Vomiting    Blurred vision Blurred vision Pt can't remember reaction  . Clindamycin/Lincomycin     tachycardia  . Levofloxacin Other (See Comments)    Feels dehydrated  . Lincomycin Other (See Comments)  tachycardia  . Montelukast Sodium Other (See Comments)    Numbness and tingling in hand. Numbness and tingling in hand.  . Sulfonamide Derivatives Other (See Comments)    Gi upset     Physical examination: Blood pressure 102/70, pulse 86, temperature 98.2 F (36.8 C), temperature source Temporal, resp. rate 14, height 5\' 8"  (1.727 m), weight 108 lb 3.2 oz (49.1 kg), SpO2 94 %.  General: Alert, interactive, in no acute distress. HEENT: PERRLA, TMs pearly gray, turbinates moderately edematous with clear discharge, post-pharynx mildly erythematous. Neck: Supple without lymphadenopathy. Lungs: Clear to auscultation without wheezing, rhonchi or rales. {no increased work of breathing. CV: Normal S1, S2 without murmurs. Abdomen: Nondistended, nontender. Skin: Warm and dry, without lesions or rashes. Extremities:  No clubbing, cyanosis or edema. Neuro:   Grossly intact.  Diagnositics/Labs:  Spirometry: FEV1: 2.61L 93%, FVC: 3.57L 97%, ratio consistent with nonobstructive pattern  Assessment and plan: Chronic rhinosinusitis   - at this time has had about 6 weeks of ongoing sinus symtpoms including HA, sinus pressure, congestion, teeth pain, cough that has not improved with 3 different antibiotics.  Thus feel symptoms are likely viral in nature.   However concerned that she is not  getting any relief of symptoms with routine medications  - stop Allegra while taking Ryclora  - will try Ryclora (antihistamine) 2mg  (1 teaspoon) twice a day (every 12 hours) for the next 1-2 weeks.    - change Nasonex to Nasacort nasal spray for help with congestion.  Use Nasacort for the next 3-5 days 2 sprays twice a day then back down to 1 spray twice a day  - continue Astelin 2 sprays twice a day for control of nasal drainage  - continue nasal saline rinses twice a day  - Can continue Mucinex 1-2 times a day as needed with plenty of water  - will order sinus CT scan to determine if you have evidence of sinus disease or ongoing sinus infection.   Will hold on any further antibiotics until CT returns  Allergic asthma  - lung function testing is normal!  - have access to albuterol inhaler 2 puffs every 4-6 hours as needed for cough/wheeze/shortness of breath/chest tightness.  May use 15-20 minutes prior to activity.   Monitor frequency of use.    - continue Arnuity 1 puff once a day    Follow-up with Dr. Neldon Mc in 2-3 months   I appreciate the opportunity to take part in Nevaeha's care. Please do not hesitate to contact me with questions.  Sincerely,   Prudy Feeler, MD Allergy/Immunology [facility]

## 2019-01-25 NOTE — Patient Instructions (Addendum)
-   stop Allegra while taking Ryclora  - will try Ryclora (antihistamine) 2mg  (1 teaspoon) twice a day (every 12 hours) for the next 1-2 weeks.    - change Nasonex to Nasacort nasal spray for help with congestion.  Use Nasacort for the next 3-5 days 2 sprays twice a day then back down to 1 spray twice a day  - continue Astelin 2 sprays twice a day for control of nasal drainage  - continue nasal saline rinses twice a day  - Can continue Mucinex 1-2 times a day as needed with plenty of water  - will order sinus CT scan to determine if you have evidence of sinus disease or ongoing sinus infection.   Will hold on any further antibiotics until CT returns   - lung function testing is normal!  - have access to albuterol inhaler 2 puffs every 4-6 hours as needed for cough/wheeze/shortness of breath/chest tightness.  May use 15-20 minutes prior to activity.   Monitor frequency of use.    - continue Arnuity 1 puff once a day    Follow-up with Dr. Neldon Mc in 2-3 months

## 2019-01-25 NOTE — Telephone Encounter (Signed)
F/u CT scan approval.

## 2019-01-28 ENCOUNTER — Other Ambulatory Visit: Payer: Self-pay

## 2019-01-28 ENCOUNTER — Telehealth: Payer: Self-pay | Admitting: *Deleted

## 2019-01-28 ENCOUNTER — Ambulatory Visit: Payer: Self-pay | Admitting: *Deleted

## 2019-01-28 ENCOUNTER — Ambulatory Visit (INDEPENDENT_AMBULATORY_CARE_PROVIDER_SITE_OTHER): Payer: Medicare HMO | Admitting: Family Medicine

## 2019-01-28 ENCOUNTER — Ambulatory Visit (INDEPENDENT_AMBULATORY_CARE_PROVIDER_SITE_OTHER): Payer: Medicare HMO | Admitting: Licensed Clinical Social Worker

## 2019-01-28 DIAGNOSIS — F411 Generalized anxiety disorder: Secondary | ICD-10-CM

## 2019-01-28 DIAGNOSIS — E440 Moderate protein-calorie malnutrition: Secondary | ICD-10-CM

## 2019-01-28 DIAGNOSIS — D649 Anemia, unspecified: Secondary | ICD-10-CM

## 2019-01-28 DIAGNOSIS — R5383 Other fatigue: Secondary | ICD-10-CM

## 2019-01-28 DIAGNOSIS — M810 Age-related osteoporosis without current pathological fracture: Secondary | ICD-10-CM

## 2019-01-28 DIAGNOSIS — G44229 Chronic tension-type headache, not intractable: Secondary | ICD-10-CM

## 2019-01-28 DIAGNOSIS — K219 Gastro-esophageal reflux disease without esophagitis: Secondary | ICD-10-CM

## 2019-01-28 DIAGNOSIS — M542 Cervicalgia: Secondary | ICD-10-CM

## 2019-01-28 DIAGNOSIS — G8929 Other chronic pain: Secondary | ICD-10-CM

## 2019-01-28 DIAGNOSIS — E782 Mixed hyperlipidemia: Secondary | ICD-10-CM

## 2019-01-28 DIAGNOSIS — R519 Headache, unspecified: Secondary | ICD-10-CM

## 2019-01-28 DIAGNOSIS — M26623 Arthralgia of bilateral temporomandibular joint: Secondary | ICD-10-CM

## 2019-01-28 NOTE — Progress Notes (Signed)
Telephone visit  Subjective: CC: neck pain PCP: [name], DO [id number] Kristy Garza is a 68 y.o. female calls for telephone consult today. Patient provides verbal consent for consult held via phone.  Location of patient: home Location of provider: Working remotely from home Others present for call: none  1. Neck pain Patient reports that she has neck pain at the base of her skull.  She uses Salonpas for pain.  She notes chronic headaches.  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    2.  Osteoporosis Patient saw Dr. Layne Benton for osteoporosis recently.  They discussed the various treatment options and the patient thinks that she is going to select Reclast as her treatment.  She wants to discuss this further with her integrative care doctor but does not have an appointment till January.  She goes on to state that she is compliant with vitamin D and calcium supplementation.  She is asking for advice on weightbearing activity to promote bone health.  Of note, she failed Forteo secondary to flu like symptoms.  She considered Evenity but unfortunately will not be able to afford the $1000 co-pay associated with this medication.  She does not feel confident that she can tolerate oral bisphosphonates secondary to severe GERD.  She is worried about Prolia causing impaired immune response.   ROS: Per HPI  Allergies  Allergen Reactions  . Cefuroxime Axetil Other (See Comments)    Extreme gas  . Gabapentin Other (See Comments)    Constipation Constipation  . Montelukast Other (See Comments)    Numbness and tingling in hand. Numbness and tingling in hand. Numbness and tingling in hand. NUMBNESS OF HANDS AND FEET. Numbness in hands and feet NUMBNESS OF HANDS AND FEET.  . Pregabalin Other (See Comments)    Drowsiness, dry mouth Drowsiness, dry mouth Makes her tired and thirsty  . Augmentin [Amoxicillin-Pot Clavulanate] Diarrhea  . Avelox [Moxifloxacin Hcl In Nacl] Other (See Comments)    Tremors   .  Biaxin [Clarithromycin]     Unsure of reaction  . Cedax [Ceftibuten] Other (See Comments)    tremors  . Ciprofloxacin Other (See Comments) and Nausea And Vomiting    Blurred vision Blurred vision Pt can't remember reaction  . Clindamycin/Lincomycin     tachycardia  . Levofloxacin Other (See Comments)    Feels dehydrated  . Lincomycin Other (See Comments)    tachycardia  . Montelukast Sodium Other (See Comments)    Numbness and tingling in hand. Numbness and tingling in hand.  . Sulfonamide Derivatives Other (See Comments)    Gi upset   Past Medical History:  Diagnosis Date  . Acid reflux   . Allergy   . Arthritis    ARTHRITIS IN NECK BY DR. Alroy Dust [facility]  . Asthma   . Interstitial cystitis   . Osteoporosis   . PONV (postoperative nausea and vomiting)   . Tinnitus   . Trigeminal neuralgia    Atypical trigeminal neuralgia    Current Outpatient Medications:  .  acetaminophen (TYLENOL) 500 MG tablet, Take by mouth., Disp: , Rfl:  .  albuterol (PROVENTIL HFA;VENTOLIN HFA) 108 (90 Base) MCG/ACT inhaler, Inhale 2 puffs into the lungs every 4 (four) hours as needed for wheezing or shortness of breath., Disp: 18 g, Rfl: 1 .  amoxicillin (AMOXIL) 875 MG tablet, Take 1 tablet (875 mg total) by mouth 2 (two) times daily for 7 days., Disp: 14 tablet, Rfl: 0 .  azelastine (ASTELIN) 0.1 % nasal spray, Place 2 sprays  into both nostrils 2 (two) times daily. Use in each nostril as directed, Disp: 30 mL, Rfl: 2 .  b complex vitamins capsule, Take 1 capsule by mouth daily., Disp: , Rfl:  .  calcium citrate-vitamin D (CITRACAL+D) 315-200 MG-UNIT tablet, Take 2 tablets by mouth 2 (two) times daily., Disp: , Rfl:  .  cetirizine HCl (ZYRTEC) 5 MG/5ML SOLN, Take 5 mg by mouth daily., Disp: , Rfl:  .  dextromethorphan-guaiFENesin (MUCINEX DM) 30-600 MG 12hr tablet, Take 1 tablet by mouth 2 (two) times daily., Disp: , Rfl:  .  doxycycline (ADOXA) 100 MG tablet, Take 1 tablet (100 mg total) by mouth  2 (two) times daily for 10 days., Disp: 20 tablet, Rfl: 0 .  Ergocalciferol (VITAMIN D2) 2000 units TABS, Take 2,000 Units by mouth daily., Disp: , Rfl:  .  Fluticasone Furoate (ARNUITY ELLIPTA) 200 MCG/ACT AEPB, Inhale 1 puff into the lungs daily., Disp: 30 each, Rfl: 0 .  Halcinonide 0.1 % CREA, Apply thin layer to skin twice daily as needed, Disp: 60 g, Rfl: 2 .  hydrOXYzine (VISTARIL) 25 MG capsule, Take 1 tablet by mouth at bedtime as needed for itch, Disp: 30 capsule, Rfl: 3 .  hyoscyamine (LEVSIN SL) 0.125 MG SL tablet, PLEASE SEE ATTACHED FOR DETAILED DIRECTIONS, Disp: , Rfl:  .  ipratropium (ATROVENT) 0.06 % nasal spray, 2 sprays by Each Nare route Three (3) times a day., Disp: , Rfl:  .  lidocaine (XYLOCAINE) 2 % jelly, Apply topically as needed., Disp: 30 mL, Rfl: 3 .  LORazepam (ATIVAN) 0.5 MG tablet, Take 1 tablet (0.5 mg total) by mouth 2 (two) times daily as needed for anxiety., Disp: 30 tablet, Rfl: 0 .  mometasone (NASONEX) 50 MCG/ACT nasal spray, PLACE 2 SPRAYS INTO THE NOSE DAILY., Disp: 17 g, Rfl: 3 .  pantoprazole (PROTONIX) 40 MG tablet, Take 1 tablet (40 mg total) by mouth daily., Disp: 30 tablet, Rfl: 5 .  phenylephrine (SUDAFED PE) 10 MG TABS tablet, Take 10 mg by mouth every 4 (four) hours as needed., Disp: , Rfl:  .  pimecrolimus (ELIDEL) 1 % cream, Apply topically 2 (two) times daily., Disp: 30 g, Rfl: 2 .  Polyethylene Glycol 3350 (MIRALAX PO), Take by mouth as needed., Disp: , Rfl:  .  Prasterone, DHEA, 25 MG CAPS, Take 2 mg by mouth daily., Disp: , Rfl:  .  Probiotic Product (PROBIOTIC PO), Take by mouth daily., Disp: , Rfl:  .  Simethicone (GAS-X PO), Take by mouth., Disp: , Rfl:  .  tizanidine (ZANAFLEX) 2 MG capsule, Take 1-2 capsules (2-4 mg total) by mouth 3 (three) times daily as needed for muscle spasms., Disp: 60 capsule, Rfl: 2 .  traMADol (ULTRAM) 50 MG tablet, Take 1 tablet (50 mg total) by mouth every 12 (twelve) hours as needed., Disp: 30 tablet, Rfl:  1 .  triamcinolone cream (KENALOG) 0.1 %, Apply 1 application topically 2 (two) times daily., Disp: 80 g, Rfl: 1 .  UNABLE TO FIND, Take 1 capsule by mouth daily. Dv3- vitamin D 3 plus immune support, Disp: , Rfl:   Assessment/ Plan: 68 y.o. female   1. Chronic neck pain Chronic issue for patient.  Likely secondary to tension.  She had a great experience with physical therapy at [facility] I would like to see them again for help with her chronic neck pain.  This referral has been placed.  I encouraged her to follow-up with me as needed on this issue - Ambulatory referral  to Physical Therapy  2. Chronic tension-type headache, not intractable - Ambulatory referral to Physical Therapy  3. Age-related osteoporosis without current pathological fracture I will mail out a packet with home physical therapy to promote bone health.  She is interested in possibly having physical therapy demonstrate some of these exercises as well for her.  Would most certainly benefit from weightbearing exercise and low impact activities given severe osteoporosis.  She is considering reclass as treatment for osteoporosis.  However, she would like to see her integrative care provider in January prior to committing to a therapy.  I encouraged her to perform the weightbearing exercise as above, continue her calcium and vitamin D supplementation.  She will follow-up as needed. - Ambulatory referral to Physical Therapy   Start time: 1:30pm End time: 1:48pm  Total time spent on patient care (including telephone call/ virtual visit): 22 minutes  [place], [place] [phone]

## 2019-01-28 NOTE — Chronic Care Management (AMB) (Signed)
  Chronic Care Management   Care Coordination  01/28/2019 Name: Kristy Garza MRN: [id number] DOB: 06/07/1950  I was consulted by Theadore Nan, LCSW after he spoke with [name] earlier today regarding her psychosocial needs, including anxiety. She requests physical therapy at [facility] for neck pain. She had therapy for jaw pain/TMJ there in the past and was pleased with them.   Collaboration with PCP clinical team. See telephone encounter from today. Patient scheduled for a televisit with provider to discuss pain and options for treatment.   Follow up plan:  Continue to talk with LCSW regarding anxiety management psychosocial issues  Patient will keep all scheduled medical appointments  RN CCM is avaialble for nursing care needs  RN CCM will f/u with PCP office on referral status over the next 7 days  RN CCM will follow-up with patient by telephone over the next 30 days  Chong Sicilian, BSN, RN-BC [facility] Direct Dial: [phone]

## 2019-01-28 NOTE — Chronic Care Management (AMB) (Signed)
Care Management Note   Kristy Garza is a 68 y.o. year old female who is a primary care patient of Kristy Norlander, DO. The CM team was consulted for assistance with chronic disease management and care coordination.   I reached out to Kristy Garza Kitchen by phone today.   Review of patient status, including review of consultants reports, relevant laboratory and other test results, and collaboration with appropriate care team members and the patient's provider was performed as part of comprehensive patient evaluation and provision of chronic care management services.   Social determinants of health: risk of social isolation;risk of physical inactivity    Office Visit from 08/28/2018 in [facility]  PHQ-9 Total Score  0     GAD 7 : Generalized Anxiety Score 08/28/2018 05/23/2018 04/11/2018  Nervous, Anxious, on Edge 0 3 1  Control/stop worrying 0 2 0  Worry too much - different things 0 2 0  Trouble relaxing 0 2 0  Restless 0 1 0  Easily annoyed or irritable 0 1 0  Afraid - awful might happen 0 1 0  Total GAD 7 Score 0 12 1   Medications    acetaminophen (TYLENOL) 500 MG tablet    albuterol (PROVENTIL HFA;VENTOLIN HFA) 108 (90 Base) MCG/ACT inhaler    amoxicillin (AMOXIL) 875 MG tablet    azelastine (ASTELIN) 0.1 % nasal spray    b complex vitamins capsule    calcium citrate-vitamin D (CITRACAL+D) 315-200 MG-UNIT tablet    cetirizine HCl (ZYRTEC) 5 MG/5ML SOLN    dextromethorphan-guaiFENesin (MUCINEX DM) 30-600 MG 12hr tablet    doxycycline (ADOXA) 100 MG tablet    Ergocalciferol (VITAMIN D2) 2000 units TABS    Fluticasone Furoate (ARNUITY ELLIPTA) 200 MCG/ACT AEPB    Halcinonide 0.1 % CREA    hydrOXYzine (VISTARIL) 25 MG capsule    hyoscyamine (LEVSIN SL) 0.125 MG SL tablet    ipratropium (ATROVENT) 0.06 % nasal spray    lidocaine (XYLOCAINE) 2 % jelly    LORazepam (ATIVAN) 0.5 MG tablet    mometasone (NASONEX) 50 MCG/ACT nasal spray    pantoprazole  (PROTONIX) 40 MG tablet    phenylephrine (SUDAFED PE) 10 MG TABS tablet    pimecrolimus (ELIDEL) 1 % cream    Polyethylene Glycol 3350 (MIRALAX PO)    Prasterone, DHEA, 25 MG CAPS    Probiotic Product (PROBIOTIC PO)    Simethicone (GAS-X PO)    tizanidine (ZANAFLEX) 2 MG capsule    traMADol (ULTRAM) 50 MG tablet    triamcinolone cream (KENALOG) 0.1 %    GOAL:    . Client said she wants to talk with someone about her health issues and how she deals with them (pt-stated)     Current Barriers:  . Limited access to food . Mental Health Concerns   . Pain issues  Clinical Social Work Clinical Goal(s):  Kristy Garza Kitchen Over the next 30 day days, client will work with LCSW to address concerns related to health issues of client and how she deals with health issues  Interventions: . Encouraged client to talk with RN CM about nursing needs of client . Talked with client about health issues of concern . Talked with client about neck pain issues of client . Talked with client about social support network (has support from her spouse, and her daughters) . Collaborated with RNCM regarding nursing needs of client   Patient Self Care Activities:  . Attends all scheduled provider appointments  . Self administers  prescribed medications . Calls provider office for new concerns or questions  Plan: Patient to contact LCSW to discuss psychosocial needs of cllient LCSW will call client in 3 weeks to discuss health needs of client  and client management of health needs Client to attend scheduled medical appointments Client to communicate with RN CM to discuss nursing needs of client  *Initial goal documentation           Follow Up Plan: LCSW to call client in the next 3 weeks to talk with client about health needs of client and to talk with client about her management of health needs faced.   Kristy Garza.Kristy Garza MSW, LCSW Licensed Clinical Social Worker [facility] [phone]

## 2019-01-28 NOTE — Patient Instructions (Addendum)
Licensed Clinical Social Worker Visit Information  Goals we discussed today:  Goals    . Client said she wants to talk with someone about her health issues and how she deals with them (pt-stated)     Current Barriers:  . Limited access to food . Mental Health Concerns   . Pain issues  Clinical Social Work Clinical Goal(s):  [name] Over the next 30 day days, client will work with LCSW to address concerns related to health issues of client and how she deals with health issues  Interventions: . Encouraged client to talk with RN CM about nursing needs of client . Talked with client about health issues of concern  Talked with client about neck pain issues of client  Talked with client about social support network (has support from her spouse, and her daughters)  Collaborated with RNCM regarding nursing needs of client  Patient Self Care Activities:  . Attends all scheduled provider appointments  . Self administers prescribed medications . Calls provider office for new concerns or questions  Plan: Patient to contact LCSW to discuss psychosocial needs of cllient LCSW will call client in 3 weeks to discuss health needs of client  and client management of health needs Client to attend scheduled medical appointments Client to communicate with RN CM to discuss nursing needs of client  *Initial goal documentation           Materials Provided: No  Follow Up Plan: LCSW will call client in next 3 weeks to discuss health needs of client and to discuss client management of health needs  The patient verbalized understanding of instructions provided today and declined a print copy of patient instruction materials.   [name].Simmone Cape MSW, LCSW Licensed Clinical Social Worker [facility] Care [facility] [phone]

## 2019-01-28 NOTE — Telephone Encounter (Signed)
Patient spoke with Theadore Nan, LCSW today and expressed desire for physical therapy at [facility] for neck pain. She used them previously for jaw pain/TMJ and found them helpful.   Will likely need an appointment with Dr [name] before this can be ordered. Will forward to [facility] clinical staff and ask them to contact patient to schedule an appointment with Dr [name] or to order PT as appropriate.   Chong Sicilian, BSN, RN-BC Embedded Chronic Care Manager [facility] [place] Management Direct Dial: [phone]

## 2019-01-28 NOTE — Telephone Encounter (Signed)
televisit made for today

## 2019-01-28 NOTE — Patient Instructions (Signed)
  Follow up plan:  Continue to talk with LCSW regarding anxiety management psychosocial issues  Patient will keep all scheduled medical appointments  RN CCM is avaialble for nursing care needs  RN CCM will f/u with PCP office on referral status over the next 7 days  RN CCM will follow-up with patient by telephone over the next 30 days  Chong Sicilian, BSN, RN-BC [place] [facility] [facility] Direct Dial: [phone]

## 2019-02-04 ENCOUNTER — Other Ambulatory Visit: Payer: Self-pay | Admitting: Allergy and Immunology

## 2019-02-05 ENCOUNTER — Ambulatory Visit
Admission: RE | Admit: 2019-02-05 | Discharge: 2019-02-05 | Disposition: A | Discharge status: Home / Self Care | Payer: Medicare HMO | Source: Ambulatory Visit | Attending: Allergy | Admitting: Allergy

## 2019-02-05 DIAGNOSIS — J329 Chronic sinusitis, unspecified: Secondary | ICD-10-CM | POA: Diagnosis not present

## 2019-02-12 ENCOUNTER — Ambulatory Visit: Payer: Medicare HMO | Admitting: Allergy and Immunology

## 2019-02-12 ENCOUNTER — Ambulatory Visit: Payer: Medicare HMO | Admitting: Physical Therapy

## 2019-02-18 ENCOUNTER — Ambulatory Visit: Payer: Self-pay | Admitting: Licensed Clinical Social Worker

## 2019-02-18 DIAGNOSIS — D649 Anemia, unspecified: Secondary | ICD-10-CM

## 2019-02-18 DIAGNOSIS — R5383 Other fatigue: Secondary | ICD-10-CM

## 2019-02-18 DIAGNOSIS — E782 Mixed hyperlipidemia: Secondary | ICD-10-CM

## 2019-02-18 DIAGNOSIS — K219 Gastro-esophageal reflux disease without esophagitis: Secondary | ICD-10-CM

## 2019-02-18 DIAGNOSIS — E440 Moderate protein-calorie malnutrition: Secondary | ICD-10-CM

## 2019-02-18 DIAGNOSIS — F411 Generalized anxiety disorder: Secondary | ICD-10-CM

## 2019-02-18 DIAGNOSIS — R519 Headache, unspecified: Secondary | ICD-10-CM

## 2019-02-18 NOTE — Patient Instructions (Addendum)
Licensed Clinical Social Worker Visit Information  Goals we discussed today:  Goals    . Client said she wants to talk with someone about her health issues and how she deals with them (pt-stated)     Current Barriers:  . Limited access to food . Mental Health Concerns   . Pain issues  Clinical Social Work Clinical Goal(s):  [name] Over the next 30 day days, client will work with LCSW to address concerns related to health issues of client and how she deals with health issues  Interventions:  Previously encouraged client to talk with RN CM about nursing needs of client  Previously talked with client about health issues of concern  Talked previously with client about neck pain issues of client  Talked previously with client about social support network (has support from her spouse, and her daughters)  Previously collaborated with [facility] regarding nursing needs of client   Patient Self Care Activities:  . Attends all scheduled provider appointments  . Self administers prescribed medications . Calls provider office for new concerns or questions  Plan: Patient to contact LCSW to discuss psychosocial needs of cllient LCSW will call client in 3 weeks to discuss health needs of client  and client management of health needs Client to attend scheduled medical appointments Client to communicate with RN CM to discuss nursing needs of client  *Initial goal documentation           Materials Provided: No  Follow Up Plan: LCSW to call client in next 3 weeks to talk with client about health needs of client and about client management of health needs  The patient verbalized understanding of instructions provided today and declined a print copy of patient instruction materials.   [name].Rielly Brunn MSW, LCSW Licensed Clinical Social Worker [facility] Care Management [phone]

## 2019-02-18 NOTE — Chronic Care Management (AMB) (Signed)
Care Management Note   Kristy Garza is a 68 y.o. year old female who is a primary care patient of [name], DO. The CM team was consulted for assistance with chronic disease management and care coordination.   I reached out to [name] by phone today.   Review of patient status, including review of consultants reports, relevant laboratory and other test results, and collaboration with appropriate care team members and the patient's provider was performed as part of comprehensive patient evaluation and provision of chronic care management services.   Social determinants of health;risk of social isolation; risk of physical inactivity    Office Visit from 08/28/2018 in [place]  PHQ-9 Total Score  0     GAD 7 : Generalized Anxiety Score 08/28/2018 05/23/2018 04/11/2018  Nervous, Anxious, on Edge 0 3 1  Control/stop worrying 0 2 0  Worry too much - different things 0 2 0  Trouble relaxing 0 2 0  Restless 0 1 0  Easily annoyed or irritable 0 1 0  Afraid - awful might happen 0 1 0  Total GAD 7 Score 0 12 1   Medications   New medications from outside sources are available for reconciliation   acetaminophen (TYLENOL) 500 MG tablet    albuterol (PROVENTIL HFA;VENTOLIN HFA) 108 (90 Base) MCG/ACT inhaler    ARNUITY ELLIPTA 200 MCG/ACT AEPB    azelastine (ASTELIN) 0.1 % nasal spray    b complex vitamins capsule    calcium citrate-vitamin D (CITRACAL+D) 315-200 MG-UNIT tablet    cetirizine HCl (ZYRTEC) 5 MG/5ML SOLN    dextromethorphan-guaiFENesin (MUCINEX DM) 30-600 MG 12hr tablet    Ergocalciferol (VITAMIN D2) 2000 units TABS    Halcinonide 0.1 % CREA    hydrOXYzine (VISTARIL) 25 MG capsule    hyoscyamine (LEVSIN SL) 0.125 MG SL tablet    ipratropium (ATROVENT) 0.06 % nasal spray    lidocaine (XYLOCAINE) 2 % jelly    LORazepam (ATIVAN) 0.5 MG tablet    mometasone (NASONEX) 50 MCG/ACT nasal spray    pantoprazole (PROTONIX) 40 MG tablet    phenylephrine (SUDAFED PE) 10 MG TABS tablet    pimecrolimus (ELIDEL) 1 % cream    Polyethylene Glycol 3350 (MIRALAX PO)    Prasterone, DHEA, 25 MG CAPS    Probiotic Product (PROBIOTIC PO)    Simethicone (GAS-X PO)    tizanidine (ZANAFLEX) 2 MG capsule    traMADol (ULTRAM) 50 MG tablet    triamcinolone cream (KENALOG) 0.1 %    Goals        . Client said she wants to talk with someone about her health issues and how she deals with them (pt-stated)     Current Barriers:  . Limited access to food . Mental Health Concerns   . Pain issues  Clinical Social Work Clinical Goal(s):  [name] Over the next 30 day days, client will work with LCSW to address concerns related to health issues of client and how she deals with health issues  Interventions: . Previously encouraged client to talk with RN CM about nursing needs of client . Previously talked with client about health issues of concern  Talked previously with client about neck pain issues of client  Talked previously with client about social support network (has support from her spouse, and her daughters)  Previously collaborated with [facility] regarding nursing needs of client   Patient Self Care Activities:  . Attends all scheduled provider appointments  . Self administers  prescribed medications . Calls provider office for new concerns or questions  Plan: Patient to contact LCSW to discuss psychosocial needs of cllient LCSW will call client in 3 weeks to discuss health needs of client  and client management of health needs Client to attend scheduled medical appointments Client to communicate with RN CM to discuss nursing needs of client  *Initial goal documentation    Follow Up Plan: LCSW to call client in next 3 weeks to talk with client about health needs of client and about client management of health needs  [name].Azyria Osmon MSW, LCSW Licensed Clinical Social Worker [facility] Care [facility]  [phone]

## 2019-02-20 ENCOUNTER — Telehealth: Payer: Medicare HMO

## 2019-02-26 ENCOUNTER — Encounter: Payer: Self-pay | Admitting: Physical Therapy

## 2019-02-26 ENCOUNTER — Ambulatory Visit: Payer: Medicare HMO | Attending: Family Medicine | Admitting: Physical Therapy

## 2019-02-26 ENCOUNTER — Other Ambulatory Visit: Payer: Self-pay

## 2019-02-26 DIAGNOSIS — M62838 Other muscle spasm: Secondary | ICD-10-CM

## 2019-02-26 DIAGNOSIS — R279 Unspecified lack of coordination: Secondary | ICD-10-CM | POA: Diagnosis not present

## 2019-02-26 DIAGNOSIS — R293 Abnormal posture: Secondary | ICD-10-CM

## 2019-02-26 DIAGNOSIS — M542 Cervicalgia: Secondary | ICD-10-CM

## 2019-02-26 NOTE — Therapy (Signed)
[facility] [address], [place], [place], [address] Phone: [phone]   Fax:  [phone]  Physical Therapy Evaluation  Patient Details  Name: Kristy Garza MRN: [id number] Date of Birth: Jul 29, 1950 Referring Provider (PT): [name], [place]   Encounter Date: 02/26/2019  PT End of Session - 02/26/19 1611    Visit Number  1    Date for PT Re-Evaluation  04/23/19    PT Start Time  1147    PT Stop Time  1230    PT Time Calculation (min)  43 min    Activity Tolerance  Patient tolerated treatment well    Behavior During Therapy  [facility] for tasks assessed/performed       Past Medical History:  Diagnosis Date  . Acid reflux   . Allergy   . Arthritis    ARTHRITIS IN NECK BY DR. Alroy Dust [facility]  . Asthma   . Interstitial cystitis   . Osteoporosis   . PONV (postoperative nausea and vomiting)   . Tinnitus   . Trigeminal neuralgia    Atypical trigeminal neuralgia    Past Surgical History:  Procedure Laterality Date  . ABDOMINAL HYSTERECTOMY  2000  . CYSTOSCOPY WITH HYDRODISTENSION AND BIOPSY N/A 06/11/2012   Procedure: CYSTOSCOPY/BIOPSY/HYDRODISTENSION with Instillation of Pyridium and Marcaine and Kenalog;  Surgeon: Ailene Rud, MD;  Location: [facility];  Service: Urology;  Laterality: N/A;  1 hour requested for this case  BLADDER BIOPSY  . ETHMOIDECTOMY  2012  . SEPTOPLASTY  1980's  . vocal cord surgery   1990's   polyp removal    There were no vitals filed for this visit.   Subjective Assessment - 02/26/19 1153    Subjective  Pt states she has been having neck pain for over 2 years. Pt had TMJ pain and still has a little pain but not as bad.  Pt also came in for some strengthening exercises due to osteoporosis.    Patient Stated Goals  strengthening program for osteoporosis, get rid of neck pain    Currently in Pain?  Yes    Pain Score  4     Pain Location  Neck    Pain  Orientation  Mid;Upper;Lower    Pain Descriptors / Indicators  Aching    Pain Type  Chronic pain    Pain Onset  More than a month ago    Pain Frequency  Constant    Aggravating Factors   feeling sore all over from raking leaves, not sure what makes neck hurt worse    Pain Relieving Factors  pain patch and heat    Multiple Pain Sites  No         [facility] PT Assessment - 02/26/19 0001      Assessment   Medical Diagnosis  M54.2,G89.29 (ICD-10-CM) - Chronic neck pain;G44.229 (ICD-10-CM) - Chronic tension-type headache, not intractable;M81.0 (ICD-10-CM) - Age-related osteoporosis without current pathological fracture    Referring Provider (PT)  Ronnie Doss M, DO      Precautions   Precautions  None      Restrictions   Weight Bearing Restrictions  No      Balance Screen   Has the patient fallen in the past 6 months  No      [place] residence    Living Arrangements  Spouse/significant other      Prior Function   Level of Independence  Independent  Leisure  cooks for family and helps bring meals to her mom      Cognition   Overall Cognitive Status  Within Functional Limits for tasks assessed      Observation/Other Assessments   Observations  173.6 cm standing posture measured standing at the wall    Focus on Therapeutic Outcomes (FOTO)   44% limited      Posture/Postural Control   Posture/Postural Control  Postural limitations    Postural Limitations  Rounded Shoulders;Weight shift right   sits on Rt side of pelvis and leans to Rt; Rt shoulder lower   Posture Comments  thoracic scoliosis to the Rt      ROM / Strength   AROM / PROM / Strength  AROM;Strength      AROM   AROM Assessment Site  Cervical    Cervical Flexion  68    Cervical Extension  55    Cervical - Right Side Bend  40    Cervical - Left Side Bend  50    Cervical - Right Rotation  70    Cervical - Left Rotation  65      Strength   Overall Strength Comments   bilat shoulders and hip 4+/5 except below    Strength Assessment Site  Hip    Right/Left Hip  Right;Left    Right Hip ADduction  4/5    Left Hip ADduction  4/5      Transfers   Five time sit to stand comments   15 sec      Ambulation/Gait   Gait Pattern  Within Functional Limits                Objective measurements completed on examination: See above findings.      [facility] PT Treatment/Exercise - 02/26/19 0001      Self-Care   Self-Care  Other Self-Care Comments    Other Self-Care Comments   educated and performed intial HEP             PT Education - 02/26/19 1604    Education Details  osteo info, intial HEP Access Code: [email or web address]    Person(s) Educated  Patient    Methods  Explanation;Demonstration;Verbal cues;Handout    Comprehension  Verbalized understanding;Returned demonstration       PT Short Term Goals - 02/26/19 1608      PT SHORT TERM GOAL #1   Title  be independent in initial HEP    Time  4    Period  Weeks    Status  New    Target Date  03/26/19      PT SHORT TERM GOAL #2   Title  report a 30% reduction in frequency and intenisty of neck pain    Time  4    Period  Weeks    Status  New    Target Date  03/26/19        PT Long Term Goals - 02/26/19 1612      PT LONG TERM GOAL #1   Title  be independent in advanced HEP    Time  8    Period  Weeks    Status  New    Target Date  04/23/19      PT LONG TERM GOAL #2   Title  Pt will report 50% less neck pain    Time  8    Period  Weeks    Status  New    Target Date  04/23/19  PT LONG TERM GOAL #3   Title  Pt will demonstrate 5x sit to stand </= to 12 seconds due to imporved strength and demonstrate low risk of falls    Baseline  15 sec    Time  8    Period  Weeks    Status  New    Target Date  04/23/19      PT LONG TERM GOAL #4   Title  FOTO < or = to 39%    Baseline  44%    Time  8    Period  Weeks    Status  New    Target Date  04/23/19      PT LONG TERM  GOAL #5   Title  .[name] KitchenMarland Kitchen             Plan - 02/26/19 1606    Clinical Impression Statement  Pt presents to clinic with main concern of osteoporosis.  She would like to learn exercises on how to reduce bone loss.  Pt demonstrates some mild weakness throughout upper and lower extremities.  She has chronic neck pain and posture abnormalities as stated above.  She also demostrates slightly slower 5xsit to stand time that shows potential risk for falls.  Pt will benefit from skilled PT to address impairments and get her on a home program to maximize health and ability to perform all functional activities.    Personal Factors and Comorbidities  Comorbidity 3+    Comorbidities  chronic pain, osteoporosis, caring for mother    Examination-Activity Limitations  Lift;Caring for Others    Examination-Participation Restrictions  Meal Prep    Stability/Clinical Decision Making  Evolving/Moderate complexity    Clinical Decision Making  Moderate    Rehab Potential  Excellent    PT Frequency  2x / week    PT Duration  8 weeks    PT Treatment/Interventions  ADLs/Self Care Home Management;Cryotherapy;Electrical Stimulation;Moist Heat;Traction;Therapeutic activities;Therapeutic exercise;Neuromuscular re-education;Patient/family education;Manual techniques;Taping;Dry needling;Passive range of motion    PT Next Visit Plan  forgot to give patient osteo info handout - reprint; posture and upper back strengthening, thoracic ROM, extension exercises    PT Home Exercise Plan  Access Code: [email or web address]    Consulted and Agree with Plan of Care  Patient       Patient will benefit from skilled therapeutic intervention in order to improve the following deficits and impairments:  Decreased strength  Visit Diagnosis: Other muscle spasm - Plan: PT plan of care cert/re-cert  Abnormal posture - Plan: PT plan of care cert/re-cert  Unspecified lack of coordination - Plan: PT plan of care cert/re-cert  Cervicalgia - Plan: PT  plan of care cert/re-cert     Problem List Patient Active Problem List   Diagnosis Date Noted  . Age-related osteoporosis without current pathological fracture 01/04/2019  . Malnutrition of moderate degree ([place]) 05/23/2018  . Adult BMI <19 kg/sq m 04/13/2018  . Pharyngitis 01/09/2018  . Anxiety state 01/19/2016  . Adjustment disorder with anxious mood 01/19/2016  . HLD (hyperlipidemia) 01/07/2016  . IBS (irritable bowel syndrome) 07/11/2015  . Functional constipation 06/22/2015  . Chronic bronchitis ([place]) 05/07/2015  . 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 Trigeminal neuralgia 04/17/2014  . Face pain 01/22/2014  . 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 Asthma, chronic 05/13/2013  . Generalized anxiety disorder 05/13/2013  . Difficulty speaking 04/11/2013  . 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 Leg cramps 03/23/2013  . Acid reflux 03/11/2013  . Osteoporosis 01/30/2013  . Abdominal  pain 01/16/2013  . Back ache 01/16/2013  . Chronic interstitial cystitis 01/16/2013  . FOM (frequency of micturition) 01/16/2013  . Seasonal allergic rhinitis 07/07/2012  . G E R D 08/15/2007  . VOCAL CORD POLYP, HX OF 08/15/2007    Jule Ser, PT 02/26/2019, 5:26 PM  [facility] [address] [place], [place], [address] Phone: [phone]   Fax:  [phone]  Name: Kristy Garza MRN: [id number] Date of Birth: [birth year]

## 2019-02-26 NOTE — Patient Instructions (Addendum)
  Osteoporosis   What is Osteoporosis?  - A silent disease in which the skeleton is weakened by decreased bone density. - Characterized by low bone mass, deterioration of bone, and increased risk of fracture postmenopausal (primary) or the result of an identifiable condition/event (secondary) - Commonly found in the wrists, spine, and hips; these are high-risk stress areas and very susceptible to fractures.  The Facts: - There are 1.5 million fractures/year o 500,000 spine; 250,000 hip with over 60,000 nursing home admissions secondary to hip fracture; and 200,000 wrist - After hip fracture, only 50% of people able to walk independently prior to the fracture return to independent ambulation. - Bone mass: Peaks at age 20-30, and begins declining at age 40-50.   Osteoporosis is defined by the World Health Organization (WHO) as:  NOF/WHO Criteria for Interpreting Results of Bone Density Assessment  Results Diagnosis  Within 1 standard deviation (SD) of young adult mean Normal  Between 1 and -2.5 SD below mean, repeat in 2 years Low bone mass (osteopenia)  Greater than -2.5 SD below mean Osteoporosis  Greater than -2.5 SD below mean and one or more fragility fractures exist Severe Osteoporosis  *Results can be affected by positioning of the body in the DEXA scan, presence of current or old fractures, arthritis, extraneous calcifications.    Osteoporosis is not just a women's disease!  - 30-40% of women will develop osteoporosis - 5-15% of males will develop osteoporosis   What are the risk factors?  1. Female 2. Thin, small frame 3. Caucasian, Asian race 4. Early menopause (<45 years old)/amenorrhea/delayed puberty 5. Old age 6. Family history (fractures, stooped posture)\ 7. Low calcium diet 8. Sedentary lifestyle 9. Alcohol, Caffeine, Smoking 10. Malnutrition, GI Disease 11. Prolonged use of Glucocorticoids (Prednisone), Meds to treat asthma, arthritis, cancers,  thyroid, and anti-seizure meds.  How do I know for sure?  Get a BONE DENSITY TEST!  This measures bone loss and it's painless, non-invasive, and only takes 5-10 minutes!  What can I do about it?  ? Decrease your risk factors (alcohol, caffeine, smoking) ? Helpful medications (see next page) ? Adequate Calcium and Vitamin D intake ? Get active! o Proper posture - Sit and stand tall! No slouching or twisting o Weight-Bearing Exercise - walking, stair climbing, elliptical; NO jogging or high-impact exercise. o Resistive Exercise - Cybex weight equipment, Nautilus, dumbbells, therabands  **Be sure to maintain proper alignment when lifting any weight!!  **When using equipment, avoid abdominal exercises which involve "crunching" or curling or twisting the trunk, biceps machines, cross-country machines, moving handlebars, or ANY MACHINE WITH ROTATION OR FORWARD BENDING!!!           Approved Pharmacologic Management of Osteoporosis  Agent Approved for prevention Approved for treatment BMD increased spine/hip Fracture reduction  Estrogen/Hormone Therapy (Estrace, Estratab, Ogen, Premarin, Vivell, Prempro, Femhert, Orthoest) Yes Yes 3-6% 35% spine and hip  Bisphosphonates  (Fosamax, Actonel, Boniva) Yes Yes 3-8% 35-50% spine and non-spine  Calcitonin (Miacalcin, Calcimar, Fortical) No Yes 0-3% None stated  Raloxifene (Evista) Yes Yes 2-3% 30-55%  Parathyroid Hormone (Forteo) No Yes, only in those at high risk for fracture None stated 53-65%     Recommended Daily Calcium Intakes   Population Group NIH/NOF* (mg elemental calcium)  Children 1-10 years 800-1200  Children 11-24 years 1200-1500  Men and Women 25-64 years At least 1200  Pregnant/Lactating At least 1200  Postmenopausal women with hormone replacement therapy At least 1200  Postmenopausal women without     hormone replacement therapy At least 1200  Men and women 65 + At least 1200  *In 1987, 1990, 1994, and 2000,  the NIH held consensus conferences on osteoporosis and calcium.  This column shows the most recent recommendations regarding calcium intak for preventing and managing osteoporosis.          Calcium Content of Selected Foods  Dairy Foods Calcium Content (mg) Non-Dairy Foods Calcium Content (mg)  Buttermilk, 1 cup 300 Calcium-fortified juice, 1 cup 300  Milk, 1 cup 300 Salmon, canned with Bones, 2 oz 100  Lactaid milk, 1 cup 300-500 Oysters, raw 13-19 medium 226  Soy milk, 1 cup 200-300 Sardines, canned with bones, 3 oz 372  Yogurt (plain, lowfat) 1 cup 250-300 Shrimp, canned 3 oz 98  Frozen yogurt (fruit) 1 cup 200-600 Collard greens, cooked 1 cup 357  Cheddar, mozzarella, or [place] cheese, 1oz 205 Broccoli, cooked 1 cup 78  Cottage cheese (lowfat) 4 oz 200 Soybeans, cooked 1 cup 131  Part-skim ricotta cheese, 4oz 335 Tofu, 4oz* *  Vanilla ice cream, 1 cup 120-300    *Calcium content of tofu varies depending on processing method; check nutritional label on package for precise calcium content.     Suggested Guidelines for Calcium Supplement Use:  ? Calcium is absorbed most efficiently if taken in small amounts throughout the day.  Always divide the daily dose into smaller amounts if the total daily dose is 500mg or more per day.  The body cannot use more than 500mg Calcium at any one time. ? The use of manufactured supplements is encouraged.  Calcium as bone meal or dolomite may contain lead or other heavy metals as contaminants. ? Calcium supplements should not be taken with high fiber meals or with bulk forming laxatives. ? If calcium carbonate is used as the supplement form, it should be taken with meals to assure that stomach acid production is present to facilitate optimal dissolution and absorption of calcium.  This is important if atrophic gastritis with hypo- or achlorhydria is present, which it is in 20-50% of older individuals. ? It is important to drink plenty  of fluids while using the supplement to help reduce problems with side effects like constipation or bloating.  If these symptoms become a problem, switching to another form of supplement may be the answer. ? Another alternative is calcium-fortified foods, including fruit juices, cereals, and breads.  These foods are now marketed with added calcium and may be less likely to cause side effects. ? Those with personal or family histories of kidney stones should be monitored to assure that hypercalcuria does not occur. CALCIUM INTAKE QUIZ  Dairy products are the primary source of calcium for most people.  For a quick estimate of your daily calcium intake, complete the following steps:  1. Use the chart below to determine your daily intake of calcium from diary foods. Servings of dairy per day 1 2 3 4 5 6 7 8  Milligrams (mg) of calcium: 250 500 750 1000 1250 1500 1750 2000   2.  Enter your total daily calcium intake from dairy foods:     _____mg  3.  Add 350 mg, which is the average for all other dietary sources:                 +            350 mg  4.  The sum of your total daily calcium intake:                 ______mg  5.  Enter the recommended calcium intake for your age from the chart below;         ______mg  6.  Enter your daily intake from step 4 above and subtract:                             -        _______mg  7.  The result is how much additional calcium you need:                                          ______mg      Recommended Daily Calcium Intake  Population Calcium (mg)  Children 1-10 years [phone]  Children 11-24 years 01-1499  Men and women 25-64 At least 1200  Pregnant/Lactating At least 1200  Postmenopausal women with hormone replacement therapy At least 1200  Postmenopausal women without hormone replacement therapy At least 1200  Men and women 65+ At least 1200       SAFETY TIPS FOR FALL PREVENTION   1. Remove throw rugs and make certain carpet edges are  securely fastened to the floor.  2. Reduce clutter, especially in traffic areas of the home.   3. Install/maintain sturdy handrails at stairs.  4. Increase wattage of lighting in hallways, bathrooms, kitchens, stairwells, and entrances to home.  5. Use night-lights near bed, in hallways, and in bathroom to improve night safety.  6. Install safety handrails in shower, tub, and around toilet.  Bathtubs and shower stalls should have non-skid surfaces.  7. When you must reach for something high, use a safety step stool, one with wide steps and a friction surface to stand on.  A type equipped with a high handrail is preferred.  8. If a cane or other walking aid has been recommended, use it to help increase your stability.  9. Wear supportive, cushioned, low-heeled shoes.  Avoid "scuffs" (backless bedroom slippers) and high heels.  10. Avoid rushing to answer a phone, doorbell, or anything else!  A portable phone that you can take from room to room with you is a good idea for security and safety.  11. Exercise regularly and stay active!!    [place] www.NOF.org   Exercise for Osteoporosis; A Safe and Effective Way to Build Bone Density and Muscle Strength By: Burnard Hawthorne, M.A.  Access Code: [email or web address]  URL: [email or web address]  Date: 02/26/2019  Prepared by: Jari Favre   Program Notes  **Be sure to perform all exercises next to a countertop or sturdy piece of furniture in case you become unsteady.   Exercises  Standing Heel Raise with Support - 15 reps - 2 sets - 1x daily - 7x weekly  Standing Toe Raises at Chair - 15 reps - 2 sets - 1x daily - 7x weekly  Mini Squat with Counter Support - 15 reps - 2 sets - 1x daily - 7x weekly  Standing Tandem Balance with Counter Support - 2 reps - 2 sets - 30 hold - 1x daily - 7x weekly  Standing Single Leg Stance with Counter Support - 2 reps - 2 sets - 30 hold - 1x daily - 7x  weekly

## 2019-02-28 ENCOUNTER — Ambulatory Visit (INDEPENDENT_AMBULATORY_CARE_PROVIDER_SITE_OTHER): Payer: Medicare HMO | Admitting: *Deleted

## 2019-02-28 DIAGNOSIS — M810 Age-related osteoporosis without current pathological fracture: Secondary | ICD-10-CM | POA: Diagnosis not present

## 2019-02-28 NOTE — Patient Instructions (Signed)
RN Assessment & Care Plan   . Osteoporosis Management (pt-stated)       Current Barriers:  . Chronic Disease Management support and education needs related to osteoporosis  Nurse Case Manager Clinical Goal(s):  [name] Over the next 30 days, patient will verbalize basic understanding of osteoporosis disease process and self health management plan as evidenced by deciding on a treatment plan with her provider.  Interventions:  . Evaluation of current treatment plan related to osteporosis and patient's adherence to plan as established by provider. . Discussed plans with patient for ongoing care management follow up and provided patient with direct contact information for care management team . Encouraged patient to continue physical therapy . Discussed patient's plan for osteoporosis management.  o Dr Lynne Logan recommended Reclast infusions o [place] has an appointment with her integrative medicine specialist in January and would like to talk with him about the options before deciding . RN will f/u with patient by telephone in February  Patient Self Care Activities:  . Performs ADL's independently . Performs IADL's independently . Unable to independently N/A  Please see past updates related to this goal by clicking on the "Past Updates" button in the selected goal         Follow-up Plan RN will reach out to patient by telephone in February Patient will reach out to CCM and/or PCP as needed  Chong Sicilian, BSN, RN-BC [place] [facility] [place] [facility] Direct Dial: [phone]

## 2019-02-28 NOTE — Chronic Care Management (AMB) (Signed)
  Chronic Care Management   Follow Up Note   02/28/2019 Name: Kristy Garza MRN: [id number] DOB: March 01, 1951  Referred by: [name], DO Reason for referral : Chronic Care Management (RN follow up)   Kristy Garza is a 68 y.o. year old female who is a primary care patient of [name], DO. The CCM team was consulted for assistance with chronic disease management and care coordination needs.    Review of patient status, including review of consultants reports, relevant laboratory and other test results, and collaboration with appropriate care team members and the patient's provider was performed as part of comprehensive patient evaluation and provision of chronic care management services.    SDOH (Social Determinants of Health) screening performed today: Physical Activity. See Care Plan for related entries.   Subjective: I spoke with Kristy Garza by telephone today. She is going to physical therapy regularly and recently Saw Dr Wandra Feinstein regarding treatment options for osteoporosis.   Objective: PT Notes from 02/26/2019  Pt presents to clinic with main concern of osteoporosis. She would like to learn exercises on how to reduce bone loss. Pt demonstrates some mild weakness throughout upper and lower extremities. She has chronic neck pain and posture abnormalities as stated above. She also demostrates slightly slower 5xsit to stand time that shows potential risk for falls. Pt will benefit from skilled PT to address impairments and get her on a home program to maximize health and ability to perform all functional activities.    RN Assessment & Care Plan   . Osteoporosis Management (pt-stated)       Current Barriers:  . Chronic Disease Management support and education needs related to osteoporosis  Nurse Case Manager Clinical Goal(s):  [name] Over the next 30 days, patient will verbalize basic understanding of osteoporosis disease process and self health management plan as  evidenced by deciding on a treatment plan with her provider.  Interventions:  . Evaluation of current treatment plan related to osteporosis and patient's adherence to plan as established by provider. . Discussed plans with patient for ongoing care management follow up and provided patient with direct contact information for care management team . Encouraged patient to continue physical therapy . Discussed patient's plan for osteoporosis management.  o Dr Lynne Logan recommended Reclast infusions o Kristy Garza has an appointment with her integrative medicine specialist in January and would like to talk with him about the options before deciding . RN will f/u with patient by telephone in February  Patient Self Care Activities:  . Performs ADL's independently . Performs IADL's independently . Unable to independently N/A  Please see past updates related to this goal by clicking on the "Past Updates" button in the selected goal         Follow-up Plan RN will reach out to patient by telephone in February Patient will reach out to CCM and/or PCP as needed  Chong Sicilian, BSN, RN-BC [place] [facility] Direct Dial: [phone]

## 2019-03-04 ENCOUNTER — Other Ambulatory Visit: Payer: Self-pay

## 2019-03-04 ENCOUNTER — Ambulatory Visit: Payer: Medicare HMO | Admitting: Physical Therapy

## 2019-03-04 ENCOUNTER — Encounter: Payer: Self-pay | Admitting: Physical Therapy

## 2019-03-04 DIAGNOSIS — M542 Cervicalgia: Secondary | ICD-10-CM

## 2019-03-04 DIAGNOSIS — M62838 Other muscle spasm: Secondary | ICD-10-CM | POA: Diagnosis not present

## 2019-03-04 DIAGNOSIS — R293 Abnormal posture: Secondary | ICD-10-CM

## 2019-03-04 DIAGNOSIS — R279 Unspecified lack of coordination: Secondary | ICD-10-CM | POA: Diagnosis not present

## 2019-03-04 NOTE — Patient Instructions (Signed)
  Osteoporosis   What is Osteoporosis?  - A silent disease in which the skeleton is weakened by decreased bone density. - Characterized by low bone mass, deterioration of bone, and increased risk of fracture postmenopausal (primary) or the result of an identifiable condition/event (secondary) - Commonly found in the wrists, spine, and hips; these are high-risk stress areas and very susceptible to fractures.  The Facts: - There are 1.5 million fractures/year o 500,000 spine; 250,000 hip with over 60,000 nursing home admissions secondary to hip fracture; and 200,000 wrist - After hip fracture, only 50% of people able to walk independently prior to the fracture return to independent ambulation. - Bone mass: Peaks at age 20-30, and begins declining at age 40-50.   Osteoporosis is defined by the World Health Organization (WHO) as:  NOF/WHO Criteria for Interpreting Results of Bone Density Assessment  Results Diagnosis  Within 1 standard deviation (SD) of young adult mean Normal  Between 1 and -2.5 SD below mean, repeat in 2 years Low bone mass (osteopenia)  Greater than -2.5 SD below mean Osteoporosis  Greater than -2.5 SD below mean and one or more fragility fractures exist Severe Osteoporosis  *Results can be affected by positioning of the body in the DEXA scan, presence of current or old fractures, arthritis, extraneous calcifications.    Osteoporosis is not just a women's disease!  - 30-40% of women will develop osteoporosis - 5-15% of males will develop osteoporosis   What are the risk factors?  1. Female 2. Thin, small frame 3. Caucasian, Asian race 4. Early menopause (<45 years old)/amenorrhea/delayed puberty 5. Old age 6. Family history (fractures, stooped posture)\ 7. Low calcium diet 8. Sedentary lifestyle 9. Alcohol, Caffeine, Smoking 10. Malnutrition, GI Disease 11. Prolonged use of Glucocorticoids (Prednisone), Meds to treat asthma, arthritis, cancers,  thyroid, and anti-seizure meds.  How do I know for sure?  Get a BONE DENSITY TEST!  This measures bone loss and it's painless, non-invasive, and only takes 5-10 minutes!  What can I do about it?  ? Decrease your risk factors (alcohol, caffeine, smoking) ? Helpful medications (see next page) ? Adequate Calcium and Vitamin D intake ? Get active! o Proper posture - Sit and stand tall! No slouching or twisting o Weight-Bearing Exercise - walking, stair climbing, elliptical; NO jogging or high-impact exercise. o Resistive Exercise - Cybex weight equipment, Nautilus, dumbbells, therabands  **Be sure to maintain proper alignment when lifting any weight!!  **When using equipment, avoid abdominal exercises which involve "crunching" or curling or twisting the trunk, biceps machines, cross-country machines, moving handlebars, or ANY MACHINE WITH ROTATION OR FORWARD BENDING!!!           Approved Pharmacologic Management of Osteoporosis  Agent Approved for prevention Approved for treatment BMD increased spine/hip Fracture reduction  Estrogen/Hormone Therapy (Estrace, Estratab, Ogen, Premarin, Vivell, Prempro, Femhert, Orthoest) Yes Yes 3-6% 35% spine and hip  Bisphosphonates  (Fosamax, Actonel, Boniva) Yes Yes 3-8% 35-50% spine and non-spine  Calcitonin (Miacalcin, Calcimar, Fortical) No Yes 0-3% None stated  Raloxifene (Evista) Yes Yes 2-3% 30-55%  Parathyroid Hormone (Forteo) No Yes, only in those at high risk for fracture None stated 53-65%     Recommended Daily Calcium Intakes   Population Group NIH/NOF* (mg elemental calcium)  Children 1-10 years 800-1200  Children 11-24 years 1200-1500  Men and Women 25-64 years At least 1200  Pregnant/Lactating At least 1200  Postmenopausal women with hormone replacement therapy At least 1200  Postmenopausal women without     hormone replacement therapy At least 1200  Men and women 65 + At least 1200  *In 1987, 1990, 1994, and 2000,  the NIH held consensus conferences on osteoporosis and calcium.  This column shows the most recent recommendations regarding calcium intak for preventing and managing osteoporosis.          Calcium Content of Selected Foods  Dairy Foods Calcium Content (mg) Non-Dairy Foods Calcium Content (mg)  Buttermilk, 1 cup 300 Calcium-fortified juice, 1 cup 300  Milk, 1 cup 300 Salmon, canned with Bones, 2 oz 100  Lactaid milk, 1 cup 300-500 Oysters, raw 13-19 medium 226  Soy milk, 1 cup 200-300 Sardines, canned with bones, 3 oz 372  Yogurt (plain, lowfat) 1 cup 250-300 Shrimp, canned 3 oz 98  Frozen yogurt (fruit) 1 cup 200-600 Collard greens, cooked 1 cup 357  Cheddar, mozzarella, or [place] cheese, 1oz 205 Broccoli, cooked 1 cup 78  Cottage cheese (lowfat) 4 oz 200 Soybeans, cooked 1 cup 131  Part-skim ricotta cheese, 4oz 335 Tofu, 4oz* *  Vanilla ice cream, 1 cup 120-300    *Calcium content of tofu varies depending on processing method; check nutritional label on package for precise calcium content.     Suggested Guidelines for Calcium Supplement Use:  ? Calcium is absorbed most efficiently if taken in small amounts throughout the day.  Always divide the daily dose into smaller amounts if the total daily dose is 500mg or more per day.  The body cannot use more than 500mg Calcium at any one time. ? The use of manufactured supplements is encouraged.  Calcium as bone meal or dolomite may contain lead or other heavy metals as contaminants. ? Calcium supplements should not be taken with high fiber meals or with bulk forming laxatives. ? If calcium carbonate is used as the supplement form, it should be taken with meals to assure that stomach acid production is present to facilitate optimal dissolution and absorption of calcium.  This is important if atrophic gastritis with hypo- or achlorhydria is present, which it is in 20-50% of older individuals. ? It is important to drink plenty  of fluids while using the supplement to help reduce problems with side effects like constipation or bloating.  If these symptoms become a problem, switching to another form of supplement may be the answer. ? Another alternative is calcium-fortified foods, including fruit juices, cereals, and breads.  These foods are now marketed with added calcium and may be less likely to cause side effects. ? Those with personal or family histories of kidney stones should be monitored to assure that hypercalcuria does not occur. CALCIUM INTAKE QUIZ  Dairy products are the primary source of calcium for most people.  For a quick estimate of your daily calcium intake, complete the following steps:  1. Use the chart below to determine your daily intake of calcium from diary foods. Servings of dairy per day 1 2 3 4 5 6 7 8  Milligrams (mg) of calcium: 250 500 750 1000 1250 1500 1750 2000   2.  Enter your total daily calcium intake from dairy foods:     _____mg  3.  Add 350 mg, which is the average for all other dietary sources:                 +            350 mg  4.  The sum of your total daily calcium intake:                 ______mg  5.  Enter the recommended calcium intake for your age from the chart below;         ______mg  6.  Enter your daily intake from step 4 above and subtract:                             -        _______mg  7.  The result is how much additional calcium you need:                                          ______mg      Recommended Daily Calcium Intake  Population Calcium (mg)  Children 1-10 years 800-1200  Children 11-24 years 1200-1500  Men and women 25-64 At least 1200  Pregnant/Lactating At least 1200  Postmenopausal women with hormone replacement therapy At least 1200  Postmenopausal women without hormone replacement therapy At least 1200  Men and women 65+ At least 1200       SAFETY TIPS FOR FALL PREVENTION   1. Remove throw rugs and make certain carpet edges are  securely fastened to the floor.  2. Reduce clutter, especially in traffic areas of the home.   3. Install/maintain sturdy handrails at stairs.  4. Increase wattage of lighting in hallways, bathrooms, kitchens, stairwells, and entrances to home.  5. Use night-lights near bed, in hallways, and in bathroom to improve night safety.  6. Install safety handrails in shower, tub, and around toilet.  Bathtubs and shower stalls should have non-skid surfaces.  7. When you must reach for something high, use a safety step stool, one with wide steps and a friction surface to stand on.  A type equipped with a high handrail is preferred.  8. If a cane or other walking aid has been recommended, use it to help increase your stability.  9. Wear supportive, cushioned, low-heeled shoes.  Avoid "scuffs" (backless bedroom slippers) and high heels.  10. Avoid rushing to answer a phone, doorbell, or anything else!  A portable phone that you can take from room to room with you is a good idea for security and safety.  11. Exercise regularly and stay active!!    Resources  National Osteoporosis Foundation www.NOF.org   Exercise for Osteoporosis; A Safe and Effective Way to Build Bone Density and Muscle Strength By: Dianne Daniels, M.A.  

## 2019-03-04 NOTE — Therapy (Signed)
[facility] [address] [place], [place], [address] Phone: [phone]   Fax:  [phone]  Physical Therapy Treatment  Patient Details  Name: Kristy Garza MRN: [id number] Date of Birth: 17-May-1950 Referring Provider (PT): [name], [place]   Encounter Date: 03/04/2019  PT End of Session - 03/04/19 1107    Visit Number  2    Date for PT Re-Evaluation  04/23/19    PT Start Time  1106    PT Stop Time  1144    PT Time Calculation (min)  38 min    Activity Tolerance  --    Behavior During Therapy  [facility] for tasks assessed/performed       Past Medical History:  Diagnosis Date  . Acid reflux   . Allergy   . Arthritis    ARTHRITIS IN NECK BY DR. Alroy Dust [facility]  . Asthma   . Interstitial cystitis   . Osteoporosis   . PONV (postoperative nausea and vomiting)   . Tinnitus   . Trigeminal neuralgia    Atypical trigeminal neuralgia    Past Surgical History:  Procedure Laterality Date  . ABDOMINAL HYSTERECTOMY  2000  . CYSTOSCOPY WITH HYDRODISTENSION AND BIOPSY N/A 06/11/2012   Procedure: CYSTOSCOPY/BIOPSY/HYDRODISTENSION with Instillation of Pyridium and Marcaine and Kenalog;  Surgeon: Ailene Rud, MD;  Location: [facility];  Service: Urology;  Laterality: N/A;  1 hour requested for this case  BLADDER BIOPSY  . ETHMOIDECTOMY  2012  . SEPTOPLASTY  1980's  . vocal cord surgery   1990's   polyp removal    There were no vitals filed for this visit.  Subjective Assessment - 03/04/19 1108    Subjective  No pain right now.                       [place] Adult PT Treatment/Exercise - 03/04/19 0001      Knee/Hip Exercises: Aerobic   Nustep  L1 x 6 min LE only to end session with resisted to LE      Shoulder Exercises: Standing   Row  Strengthening;Both;15 reps;Theraband    Theraband Level (Shoulder Row)  Level 2 (Red)    Row Limitations  add to HEP      Ankle  Exercises: Standing   Other Standing Ankle Exercises  Heel & toe rasies review 10x bil     Other Standing Ankle Exercises  Single leg stance and tandem stance bil 20 sec each              PT Education - 03/04/19 1118    Education Details  HEP: red band scap unattached series and standing rowing    Person(s) Educated  Patient    Methods  Explanation;Demonstration;Verbal cues;Handout    Comprehension  Verbalized understanding;Returned demonstration       PT Short Term Goals - 02/26/19 1608      PT SHORT TERM GOAL #1   Title  be independent in initial HEP    Time  4    Period  Weeks    Status  New    Target Date  03/26/19      PT SHORT TERM GOAL #2   Title  report a 30% reduction in frequency and intenisty of neck pain    Time  4    Period  Weeks    Status  New    Target Date  03/26/19  PT Long Term Goals - 02/26/19 1612      PT LONG TERM GOAL #1   Title  be independent in advanced HEP    Time  8    Period  Weeks    Status  New    Target Date  04/23/19      PT LONG TERM GOAL #2   Title  Pt will report 50% less neck pain    Time  8    Period  Weeks    Status  New    Target Date  04/23/19      PT LONG TERM GOAL #3   Title  Pt will demonstrate 5x sit to stand </= to 12 seconds due to imporved strength and demonstrate low risk of falls    Baseline  15 sec    Time  8    Period  Weeks    Status  New    Target Date  04/23/19      PT LONG TERM GOAL #4   Title  FOTO < or = to 39%    Baseline  44%    Time  8    Period  Weeks    Status  New    Target Date  04/23/19      PT LONG TERM GOAL #5   Title  ...            Plan - 03/04/19 1108    Clinical Impression Statement  pt is independent and compliant with initial HEP. Today we added red band resisted postural exercises to progress the HEP. Pt is working hard to increase her awarness of her posture andwhen she starts to slouch.    Personal Factors and Comorbidities  Comorbidity 3+     Comorbidities  chronic pain, osteoporosis, caring for mother    Examination-Activity Limitations  Lift;Caring for Others    Examination-Participation Restrictions  Meal Prep    Stability/Clinical Decision Making  Evolving/Moderate complexity    Rehab Potential  Excellent    PT Frequency  2x / week    PT Duration  8 weeks    PT Treatment/Interventions  ADLs/Self Care Home Management;Cryotherapy;Electrical Stimulation;Moist Heat;Traction;Therapeutic activities;Therapeutic exercise;Neuromuscular re-education;Patient/family education;Manual techniques;Taping;Dry needling;Passive range of motion    PT Next Visit Plan  Review HEP given today. Continue to progress HEP.    PT Home Exercise Plan  Access Code: [email or web address] and Agree with Plan of Care  Patient       Patient will benefit from skilled therapeutic intervention in order to improve the following deficits and impairments:  Decreased strength  Visit Diagnosis: Other muscle spasm  Abnormal posture  Unspecified lack of coordination  Cervicalgia     Problem List Patient Active Problem List   Diagnosis Date Noted  . Age-related osteoporosis without current pathological fracture 01/04/2019  . Malnutrition of moderate degree ([place]) 05/23/2018  . Adult BMI <19 kg/sq m 04/13/2018  . Pharyngitis 01/09/2018  . Anxiety state 01/19/2016  . Adjustment disorder with anxious mood 01/19/2016  . HLD (hyperlipidemia) 01/07/2016  . IBS (irritable bowel syndrome) 07/11/2015  . Functional constipation 06/22/2015  . Chronic bronchitis ([place]) 05/07/2015  . 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 Trigeminal neuralgia 04/17/2014  . Face pain 01/22/2014  . 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 Asthma, chronic 05/13/2013  . Generalized anxiety disorder 05/13/2013  . Difficulty speaking 04/11/2013  . 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 Leg cramps 03/23/2013  . Acid reflux 03/11/2013  . Osteoporosis 01/30/2013  .  Abdominal pain 01/16/2013   . Back ache 01/16/2013  . Chronic interstitial cystitis 01/16/2013  . FOM (frequency of micturition) 01/16/2013  . Seasonal allergic rhinitis 07/07/2012  . G E R D 08/15/2007  . VOCAL CORD POLYP, HX OF 08/15/2007    COCHRAN,JENNIFER, PTA 03/04/2019, 11:38 AM  [facility] [address] [place], [place], [address] Phone: [phone]   Fax:  [phone]  Name: Kristy Garza MRN: [id number] Date of Birth: 03-02-1951  Access Code: [email or web address]  URL: [email or web address]  Date: 03/04/2019  Prepared by: Myrene Galas   Program Notes  **Be sure to perform all exercises next to a countertop or sturdy piece of furniture in case you become unsteady.   Exercises  Standing Heel Raise with Support - 15 reps - 2 sets - 1x daily - 7x weekly  Standing Toe Raises at Chair - 15 reps - 2 sets - 1x daily - 7x weekly  Mini Squat with Counter Support - 15 reps - 2 sets - 1x daily - 7x weekly  Standing Tandem Balance with Counter Support - 2 reps - 2 sets - 30 hold - 1x daily - 7x weekly  Standing Single Leg Stance with Counter Support - 2 reps - 2 sets - 30 hold - 1x daily - 7x weekly  Seated Shoulder Horizontal Abduction with Resistance - 10 reps - 2 sets - 1x daily - 7x weekly  Seated Shoulder Diagonal Pulls with Resistance - 10 reps - 2 sets - 1x daily - 7x weekly  Seated Shoulder Diagonal Pulls with Resistance - 10 reps - 3 sets - 1x daily - 7x weekly  Standing Row with Anchored Resistance - 10 reps - 3 sets - 1x daily - 7x weekly

## 2019-03-12 ENCOUNTER — Ambulatory Visit: Payer: Self-pay | Admitting: Licensed Clinical Social Worker

## 2019-03-12 DIAGNOSIS — K581 Irritable bowel syndrome with constipation: Secondary | ICD-10-CM

## 2019-03-12 DIAGNOSIS — M810 Age-related osteoporosis without current pathological fracture: Secondary | ICD-10-CM

## 2019-03-12 DIAGNOSIS — K219 Gastro-esophageal reflux disease without esophagitis: Secondary | ICD-10-CM

## 2019-03-12 DIAGNOSIS — E782 Mixed hyperlipidemia: Secondary | ICD-10-CM

## 2019-03-12 DIAGNOSIS — F411 Generalized anxiety disorder: Secondary | ICD-10-CM

## 2019-03-12 NOTE — Chronic Care Management (AMB) (Signed)
Care Management Note   Kristy Garza is a 68 y.o. year old female who is a primary care patient of [name], DO. The CM team was consulted for assistance with chronic disease management and care coordination.   I reached out to [name] by phone today.   Review of patient status, including review of consultants reports, relevant laboratory and other test results, and collaboration with appropriate care team members and the patient's provider was performed as part of comprehensive patient evaluation and provision of chronic care management services.   Social determinants of health:risk of tobacco use; risk of financial strain; risk of depression; risk of housing needs    Office Visit from 08/28/2018 in [facility]  PHQ-9 Total Score  0     GAD 7 : Generalized Anxiety Score 08/28/2018 05/23/2018 04/11/2018  Nervous, Anxious, on Edge 0 3 1  Control/stop worrying 0 2 0  Worry too much - different things 0 2 0  Trouble relaxing 0 2 0  Restless 0 1 0  Easily annoyed or irritable 0 1 0  Afraid - awful might happen 0 1 0  Total GAD 7 Score 0 12 1   Medications  (very important)  New medications from outside sources are available for reconciliation  acetaminophen (TYLENOL) 500 MG tablet albuterol (PROVENTIL HFA;VENTOLIN HFA) 108 (90 Base) MCG/ACT inhaler ARNUITY ELLIPTA 200 MCG/ACT AEPB azelastine (ASTELIN) 0.1 % nasal spray b complex vitamins capsule calcium citrate-vitamin D (CITRACAL+D) 315-200 MG-UNIT tablet cetirizine HCl (ZYRTEC) 5 MG/5ML SOLN dextromethorphan-guaiFENesin (MUCINEX DM) 30-600 MG 12hr tablet Ergocalciferol (VITAMIN D2) 2000 units TABS Halcinonide 0.1 % CREA hydrOXYzine (VISTARIL) 25 MG capsule hyoscyamine (LEVSIN SL) 0.125 MG SL tablet ipratropium (ATROVENT) 0.06 % nasal spray lidocaine (XYLOCAINE) 2 % jelly LORazepam (ATIVAN) 0.5 MG tablet mometasone (NASONEX) 50 MCG/ACT nasal spray pantoprazole (PROTONIX) 40 MG  tablet phenylephrine (SUDAFED PE) 10 MG TABS tablet pimecrolimus (ELIDEL) 1 % cream Polyethylene Glycol 3350 (MIRALAX PO) Prasterone, DHEA, 25 MG CAPS Probiotic Product (PROBIOTIC PO) Simethicone (GAS-X PO) tizanidine (ZANAFLEX) 2 MG capsule traMADol (ULTRAM) 50 MG tablet triamcinolone cream (KENALOG) 0.1 %  Goals Addressed            This Visit's Progress   . Client said she wants to talk with someone about her health issues and how she deals with them (pt-stated)       Current Barriers:  . Limited access to food . Mental Health Concerns  in patient with chronic diagnoses of IBS, Hyperlipidemia, GAD, GERD and Osteoporosis . Pain issues  Clinical Social Work Clinical Goal(s):  [name] Over the next 30 day days, client will work with LCSW to address concerns related to health issues of client and how she deals with health issues  Interventions: . Encouraged client to talk with RN CM about nursing needs of client . Talked with client about health issues of concern . Talked with client about pain issues of client . Talked with client about her current physical therapy sessions . Talked with Athziri about her social support network (spouse) . Talked with client about relaxation techniques of choice (reading, listening to music, watching TV) . Talked with client about transport needs of client . Talked with client about her upcoming client appointments  Patient Self Care Activities:  . Attends all scheduled provider appointments  . Self administers prescribed medications . Calls provider office for new concerns or questions  Plan: Patient to contact LCSW to discuss psychosocial needs of cllient LCSW  will call client in 4 weeks to discuss health needs of client  and client management of health needs Client to attend scheduled medical appointments Client to communicate with RN CM to discuss nursing needs of client  *Initial goal documentation      Follow Up Plan: LCSW to call  client in next 4 weeks to discuss health needs of client and client management of health needs faced  [name].Djibril Glogowski MSW, LCSW Licensed Clinical Social Worker [facility] Care Management [phone]

## 2019-03-12 NOTE — Patient Instructions (Addendum)
Licensed Clinical Social Worker Visit Information  Goals we discussed today:  Goals Addressed            This Visit's Progress   . Client said she wants to talk with someone about her health issues and how she deals with them (pt-stated)       Current Barriers:  . Limited access to food . Mental Health Concerns  in patient with chronic diagnoses of IBS, Hyperlipidemia, GAD, GERD and Osteoporosis . Pain issues  Clinical Social Work Clinical Goal(s):  [name] Over the next 30 day days, client will work with LCSW to address concerns related to health issues of client and how she deals with health issues  Interventions:  Encouraged client to talk with RN CM about nursing needs of client  Talked with client about health issues of concern  Talked with client about pain issues of client  Talked with client about her current physical therapy sessions  Talked with Skyla about her social support network (spouse)  Talked with client about relaxation techniques of choice (reading, listening to music, watching TV)  Talked with client about transport needs of client  Talked with client about her upcoming client appointments  Patient Self Care Activities:  . Attends all scheduled provider appointments  . Self administers prescribed medications . Calls provider office for new concerns or questions  Plan: Patient to contact LCSW to discuss psychosocial needs of cllient LCSW will call client in 4 weeks to discuss health needs of client  and client management of health needs Client to attend scheduled medical appointments Client to communicate with RN CM to discuss nursing needs of client  *Initial goal documentation            Materials Provided: No  Follow Up Plan: LCSW to call client in next 4 weeks to discuss health needs of client and client management of health needs  The patient verbalized understanding of instructions provided today and declined a print copy of patient  instruction materials.   [name].Roslyn Else MSW, LCSW Licensed Clinical Social Worker [facility] Care Management [phone]

## 2019-03-13 ENCOUNTER — Encounter: Payer: Medicare HMO | Admitting: Physical Therapy

## 2019-03-18 ENCOUNTER — Other Ambulatory Visit: Payer: Self-pay

## 2019-03-18 ENCOUNTER — Ambulatory Visit: Payer: Medicare HMO | Admitting: Physical Therapy

## 2019-03-18 ENCOUNTER — Encounter: Payer: Self-pay | Admitting: Physical Therapy

## 2019-03-18 DIAGNOSIS — R293 Abnormal posture: Secondary | ICD-10-CM | POA: Diagnosis not present

## 2019-03-18 DIAGNOSIS — M542 Cervicalgia: Secondary | ICD-10-CM | POA: Diagnosis not present

## 2019-03-18 DIAGNOSIS — R279 Unspecified lack of coordination: Secondary | ICD-10-CM

## 2019-03-18 DIAGNOSIS — M62838 Other muscle spasm: Secondary | ICD-10-CM

## 2019-03-18 NOTE — Therapy (Signed)
[facility] [address] [place], [place], [address] Phone: [phone]   Fax:  [phone]  Physical Therapy Treatment  Patient Details  Name: Kristy Garza MRN: [id number] Date of Birth: Jan 26, 1951 Referring Provider (PT): [name], [place]   Encounter Date: 03/18/2019  PT End of Session - 03/18/19 1136    Visit Number  3    Date for PT Re-Evaluation  04/23/19    PT Start Time  1103    PT Stop Time  1137    PT Time Calculation (min)  34 min    Activity Tolerance  Patient tolerated treatment well    Behavior During Therapy  [facility] for tasks assessed/performed       Past Medical History:  Diagnosis Date  . Acid reflux   . Allergy   . Arthritis    ARTHRITIS IN NECK BY DR. Alroy Dust [facility]AC  . Asthma   . Interstitial cystitis   . Osteoporosis   . PONV (postoperative nausea and vomiting)   . Tinnitus   . Trigeminal neuralgia    Atypical trigeminal neuralgia    Past Surgical History:  Procedure Laterality Date  . ABDOMINAL HYSTERECTOMY  2000  . CYSTOSCOPY WITH HYDRODISTENSION AND BIOPSY N/A 06/11/2012   Procedure: CYSTOSCOPY/BIOPSY/HYDRODISTENSION with Instillation of Pyridium and Marcaine and Kenalog;  Surgeon: Ailene Rud, MD;  Location: [facility];  Service: Urology;  Laterality: N/A;  1 hour requested for this case  BLADDER BIOPSY  . ETHMOIDECTOMY  2012  . SEPTOPLASTY  1980's  . vocal cord surgery   1990's   polyp removal    There were no vitals filed for this visit.  Subjective Assessment - 03/18/19 1104    Subjective  i am doing my HEP well, no issues. TMJ a little bothersome right now.    Currently in Pain?  Yes    Pain Score  2     Pain Location  Jaw    Pain Orientation  Right;Left    Pain Descriptors / Indicators  Sore    Multiple Pain Sites  No                       [facility] Adult PT Treatment/Exercise - 03/18/19 0001      Knee/Hip Exercises:  Aerobic   Nustep  L1 x 6 min LE only to end session with resisted to LE    Other Aerobic  Arm bike to end session 3 min reverse: with towle roll for back support      Knee/Hip Exercises: Seated   Sit to Sand  2 sets;10 reps;without UE support   second set add red band horizontal abd     Shoulder Exercises: Standing   Row  Strengthening;Both;20 reps;Theraband    Theraband Level (Shoulder Row)  Level 2 (Red)    Row Limitations  VC/TC for posture correction, slow spped, relax Bil upper traps more    Other Standing Exercises  Seated or standing: 1# 3 ways raise 10x each             PT Education - 03/18/19 1114    Education Details  Sit to stand for additional HEP    Person(s) Educated  Patient    Methods  Explanation;Demonstration;Verbal cues;Handout    Comprehension  Returned demonstration;Verbalized understanding       PT Short Term Goals - 03/18/19 1108      PT SHORT TERM GOAL #1   Title  be independent in initial HEP    Time  4    Period  Weeks    Status  Achieved    Target Date  03/26/19        PT Long Term Goals - 02/26/19 1612      PT LONG TERM GOAL #1   Title  be independent in advanced HEP    Time  8    Period  Weeks    Status  New    Target Date  04/23/19      PT LONG TERM GOAL #2   Title  Pt will report 50% less neck pain    Time  8    Period  Weeks    Status  New    Target Date  04/23/19      PT LONG TERM GOAL #3   Title  Pt will demonstrate 5x sit to stand </= to 12 seconds due to imporved strength and demonstrate low risk of falls    Baseline  15 sec    Time  8    Period  Weeks    Status  New    Target Date  04/23/19      PT LONG TERM GOAL #4   Title  FOTO < or = to 39%    Baseline  44%    Time  8    Period  Weeks    Status  New    Target Date  04/23/19      PT LONG TERM GOAL #5   Title  ...            Plan - 03/18/19 1107    Clinical Impression Statement  Pt is independent and compliant with HEP given last. She missed her  appt last week due to the weather. She reports her TMJ was bothering her today. She was visably having a hard time with her mask today, it was laying towards the LT side due to an overstretched Rt ear strap. We discussed possible she was tightening her jaw muscles to help correct her mask. She was going to pay attention to this. We added 2 more exerices to her HEP: sit to stand and shoulder 3 way raises with light weight. She demonstrated both with good control and "felt" her shoulders. She reports no change in neck pain since beginning therapy.    Personal Factors and Comorbidities  Comorbidity 3+    Comorbidities  chronic pain, osteoporosis, caring for mother    Examination-Activity Limitations  Lift;Caring for Others    Examination-Participation Restrictions  Meal Prep    Stability/Clinical Decision Making  Evolving/Moderate complexity    Rehab Potential  Excellent    PT Frequency  2x / week    PT Duration  8 weeks    PT Next Visit Plan  Review HEP given today. Continue to progress HEP.    PT Home Exercise Plan  Access Code: [email or web address] and Agree with Plan of Care  Patient       Patient will benefit from skilled therapeutic intervention in order to improve the following deficits and impairments:     Visit Diagnosis: Other muscle spasm  Abnormal posture  Unspecified lack of coordination  Cervicalgia     Problem List Patient Active Problem List   Diagnosis Date Noted  . Age-related osteoporosis without current pathological fracture 01/04/2019  . Malnutrition of moderate degree ([place]) 05/23/2018  . Adult BMI <19 kg/sq m 04/13/2018  . Pharyngitis 01/09/2018  .  Anxiety state 01/19/2016  . Adjustment disorder with anxious mood 01/19/2016  . HLD (hyperlipidemia) 01/07/2016  . IBS (irritable bowel syndrome) 07/11/2015  . Functional constipation 06/22/2015  . Chronic bronchitis ([place]) 05/07/2015  . 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 Trigeminal neuralgia 04/17/2014  . Face pain  01/22/2014  . 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 Asthma, chronic 05/13/2013  . Generalized anxiety disorder 05/13/2013  . Difficulty speaking 04/11/2013  . 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 Leg cramps 03/23/2013  . Acid reflux 03/11/2013  . Osteoporosis 01/30/2013  . Abdominal pain 01/16/2013  . Back ache 01/16/2013  . Chronic interstitial cystitis 01/16/2013  . FOM (frequency of micturition) 01/16/2013  . Seasonal allergic rhinitis 07/07/2012  . G E R D 08/15/2007  . VOCAL CORD POLYP, HX OF 08/15/2007    Dock Baccam, PTA 03/18/2019, 11:41 AM  [facility] [address], [place], [place], [address] Phone: [phone]   Fax:  [phone]  Name: AURIYA GIVINS MRN: [id number] Date of Birth: October 23, 1950  Access Code: [email or web address]  URL: [email or web address]  Date: 03/18/2019  Prepared by: Myrene Galas   Program Notes  **Be sure to perform all exercises next to a countertop or sturdy piece of furniture in case you become unsteady.   Exercises  Standing Heel Raise with Support - 15 reps - 2 sets - 1x daily - 7x weekly  Standing Toe Raises at Chair - 15 reps - 2 sets - 1x daily - 7x weekly  Mini Squat with Counter Support - 15 reps - 2 sets - 1x daily - 7x weekly  Standing Tandem Balance with Counter Support - 2 reps - 2 sets - 30 hold - 1x daily - 7x weekly  Standing Single Leg Stance with Counter Support - 2 reps - 2 sets - 30 hold - 1x daily - 7x weekly  Seated Shoulder Horizontal Abduction with Resistance - 10 reps - 2 sets - 1x daily - 7x weekly  Seated Shoulder Diagonal Pulls with Resistance - 10 reps - 2 sets - 1x daily - 7x weekly  Seated Shoulder Diagonal Pulls with Resistance - 10 reps - 3 sets - 1x daily - 7x weekly  Standing Row with Anchored Resistance - 10 reps - 3 sets - 1x daily - 7x weekly  Sit to Stand without Arm Support - 10 reps - 2 sets - 1x daily - 7x weekly   Single Arm Shoulder Flexion - 10 reps - 2 sets - 1x daily - 7x weekly

## 2019-03-26 ENCOUNTER — Ambulatory Visit: Payer: Medicare HMO | Admitting: Physical Therapy

## 2019-03-26 ENCOUNTER — Encounter: Payer: Self-pay | Admitting: Physical Therapy

## 2019-03-26 ENCOUNTER — Other Ambulatory Visit: Payer: Self-pay

## 2019-03-26 DIAGNOSIS — R293 Abnormal posture: Secondary | ICD-10-CM

## 2019-03-26 DIAGNOSIS — R279 Unspecified lack of coordination: Secondary | ICD-10-CM

## 2019-03-26 DIAGNOSIS — M62838 Other muscle spasm: Secondary | ICD-10-CM | POA: Diagnosis not present

## 2019-03-26 DIAGNOSIS — M542 Cervicalgia: Secondary | ICD-10-CM | POA: Diagnosis not present

## 2019-03-26 NOTE — Therapy (Addendum)
[facility] [address] [place], [place], [address] Phone: [phone]   Fax:  [phone]  Physical Therapy Treatment  Patient Details  Name: Kristy Garza MRN: [id number] Date of Birth: 1950-06-24 Referring Provider (PT): [name], [place]   Encounter Date: 03/26/2019  PT End of Session - 03/26/19 1156    Visit Number  4    Date for PT Re-Evaluation  04/23/19    PT Start Time  1153   pt arrived late   PT Stop Time  1229    PT Time Calculation (min)  36 min    Activity Tolerance  Patient tolerated treatment well    Behavior During Therapy  [facility] for tasks assessed/performed       Past Medical History:  Diagnosis Date  . Acid reflux   . Allergy   . Arthritis    ARTHRITIS IN NECK BY DR. Alroy Dust [facility]  . Asthma   . Interstitial cystitis   . Osteoporosis   . PONV (postoperative nausea and vomiting)   . Tinnitus   . Trigeminal neuralgia    Atypical trigeminal neuralgia    Past Surgical History:  Procedure Laterality Date  . ABDOMINAL HYSTERECTOMY  2000  . CYSTOSCOPY WITH HYDRODISTENSION AND BIOPSY N/A 06/11/2012   Procedure: CYSTOSCOPY/BIOPSY/HYDRODISTENSION with Instillation of Pyridium and Marcaine and Kenalog;  Surgeon: Ailene Rud, MD;  Location: [facility];  Service: Urology;  Laterality: N/A;  1 hour requested for this case  BLADDER BIOPSY  . ETHMOIDECTOMY  2012  . SEPTOPLASTY  1980's  . vocal cord surgery   1990's   polyp removal    There were no vitals filed for this visit.  Subjective Assessment - 03/26/19 1157    Subjective  Pt states she had lower back pain and a little in the neck.    Currently in Pain?  Yes    Pain Score  5     Pain Location  Back    Multiple Pain Sites  Yes    Pain Score  3    Pain Location  Neck         [facility] PT Assessment - 03/26/19 0001      Standardized Balance Assessment   Standardized Balance Assessment  Five Times Sit  to Stand    Five times sit to stand comments   14                   [facility] Adult PT Treatment/Exercise - 03/26/19 0001      Knee/Hip Exercises: Aerobic   Other Aerobic  arm bike L1.8 back x 4 fwd x 2 min - PT present for status update      Knee/Hip Exercises: Standing   Heel Raises  Right;Left;20 reps;3 seconds    Knee Flexion  Strengthening;Both;15 reps   2lb standing on black foam pad   Hip Abduction  Stengthening;Both;10 reps   2 lb standing on black foam pad   Forward Step Up  Both;20 reps;Hand Hold: 0   2lb     Knee/Hip Exercises: Seated   Sit to Sand  2 sets;10 reps;without UE support      Shoulder Exercises: Standing   Row  Strengthening;Both;20 reps;Weights    Row Weight (lbs)  15    Row Limitations  VC/TC for posture correction, slow spped, relax Bil upper traps more    Diagonals  Strengthening;Right;Left;20 reps;Weights    Diagonals Limitations  1 plate  PT Short Term Goals - 03/26/19 1222      PT SHORT TERM GOAL #2   Title  report a 30% reduction in frequency and intenisty of neck pain    Baseline  1/3 less    Status  Achieved        PT Long Term Goals - 03/26/19 1222      PT LONG TERM GOAL #1   Title  be independent in advanced HEP    Status  On-going      PT LONG TERM GOAL #2   Title  Pt will report 50% less neck pain    Status  On-going      PT LONG TERM GOAL #3   Title  Pt will demonstrate 5x sit to stand </= to 12 seconds due to imporved strength and demonstrate low risk of falls    Status  On-going      PT LONG TERM GOAL #4   Title  FOTO < or = to 39%    Status  On-going            Plan - 03/26/19 1309    Clinical Impression Statement  Pt shows some improvement with 5xsit to stand.  She was able to increase resistance slightly today.  She also met short term goal for reduced neck pain.  pt is feeling confident with her HEP at this time and will most likely be able to discharge from skilled PT in one or  two more visits.    PT Treatment/Interventions  ADLs/Self Care Home Management;Cryotherapy;Electrical Stimulation;Moist Heat;Traction;Therapeutic activities;Therapeutic exercise;Neuromuscular re-education;Patient/family education;Manual techniques;Taping;Dry needling;Passive range of motion    PT Next Visit Plan  Review HEP given today. Continue to progress HEP discuss discharge, review neck ROM, re-assess for discharge, add a couple balance to exercises    PT Home Exercise Plan  Access Code: [email or web address]    Consulted and Agree with Plan of Care  Patient       Patient will benefit from skilled therapeutic intervention in order to improve the following deficits and impairments:     Visit Diagnosis: Other muscle spasm  Abnormal posture  Unspecified lack of coordination  Cervicalgia     Problem List Patient Active Problem List   Diagnosis Date Noted  . Age-related osteoporosis without current pathological fracture 01/04/2019  . Malnutrition of moderate degree ([place]) 05/23/2018  . Adult BMI <19 kg/sq m 04/13/2018  . Pharyngitis 01/09/2018  . Anxiety state 01/19/2016  . Adjustment disorder with anxious mood 01/19/2016  . HLD (hyperlipidemia) 01/07/2016  . IBS (irritable bowel syndrome) 07/11/2015  . Functional constipation 06/22/2015  . Chronic bronchitis ([place]) 05/07/2015  . 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 Trigeminal neuralgia 04/17/2014  . Face pain 01/22/2014  . 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 Asthma, chronic 05/13/2013  . Generalized anxiety disorder 05/13/2013  . Difficulty speaking 04/11/2013  . 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 Leg cramps 03/23/2013  . Acid reflux 03/11/2013  . Osteoporosis 01/30/2013  . Abdominal pain 01/16/2013  . Back ache 01/16/2013  . Chronic interstitial cystitis 01/16/2013  . FOM (frequency of micturition) 01/16/2013  . Seasonal allergic rhinitis 07/07/2012  . G E R D 08/15/2007  . VOCAL CORD POLYP, HX OF 08/15/2007    Jule Ser, PT 03/26/2019, 1:11 PM  [facility] [address], [place], [place], [address] Phone: [phone]   Fax:  [phone]  Name: Kristy Garza MRN: [id number] Date of Birth: 27-Aug-1950  PHYSICAL THERAPY DISCHARGE SUMMARY  Visits from Start of Care: 4  Current functional level related to goals / functional outcomes: See above goals   Remaining deficits: See above   Education / Equipment: HEP Plan: Patient agrees to discharge.  Patient goals were not met. Patient is being discharged due to not returning since the last visit.  ?????    [employer], PT 05/27/19 11:11 AM

## 2019-04-01 ENCOUNTER — Encounter: Payer: Medicare HMO | Admitting: Physical Therapy

## 2019-04-04 ENCOUNTER — Encounter: Payer: Medicare HMO | Admitting: Physical Therapy

## 2019-04-05 ENCOUNTER — Ambulatory Visit: Payer: Medicare HMO | Admitting: Physical Therapy

## 2019-04-08 ENCOUNTER — Encounter: Payer: Medicare HMO | Admitting: Physical Therapy

## 2019-04-10 ENCOUNTER — Ambulatory Visit (INDEPENDENT_AMBULATORY_CARE_PROVIDER_SITE_OTHER): Payer: Medicare HMO | Admitting: Licensed Clinical Social Worker

## 2019-04-10 ENCOUNTER — Ambulatory Visit: Payer: Self-pay | Admitting: *Deleted

## 2019-04-10 DIAGNOSIS — E782 Mixed hyperlipidemia: Secondary | ICD-10-CM | POA: Diagnosis not present

## 2019-04-10 DIAGNOSIS — G8929 Other chronic pain: Secondary | ICD-10-CM

## 2019-04-10 DIAGNOSIS — K219 Gastro-esophageal reflux disease without esophagitis: Secondary | ICD-10-CM

## 2019-04-10 DIAGNOSIS — K581 Irritable bowel syndrome with constipation: Secondary | ICD-10-CM

## 2019-04-10 DIAGNOSIS — M810 Age-related osteoporosis without current pathological fracture: Secondary | ICD-10-CM | POA: Diagnosis not present

## 2019-04-10 DIAGNOSIS — G500 Trigeminal neuralgia: Secondary | ICD-10-CM

## 2019-04-10 DIAGNOSIS — F411 Generalized anxiety disorder: Secondary | ICD-10-CM

## 2019-04-10 DIAGNOSIS — M26623 Arthralgia of bilateral temporomandibular joint: Secondary | ICD-10-CM

## 2019-04-10 NOTE — Chronic Care Management (AMB) (Signed)
Care Management Note   Kristy Garza is a 69 y.o. year old female who is a primary care patient of Kristy Norlander, DO. The CM team was consulted for assistance with chronic disease management and care coordination.   I reached out to Kristy Garza Kitchen by phone today.   Review of patient status, including review of consultants reports, relevant laboratory and other test results, and collaboration with appropriate care team members and the patient's provider was performed as part of comprehensive patient evaluation and provision of chronic care management services.   Social determinants of health: risk of tobacco use; risk of financial strain; risk of depression ; risk of housing needs    Office Visit from 08/28/2018 in [place]  PHQ-9 Total Score  0      GAD 7 : Generalized Anxiety Score 08/28/2018 05/23/2018 04/11/2018  Nervous, Anxious, on Edge 0 3 1  Control/stop worrying 0 2 0  Worry too much - different things 0 2 0  Trouble relaxing 0 2 0  Restless 0 1 0  Easily annoyed or irritable 0 1 0  Afraid - awful might happen 0 1 0  Total GAD 7 Score 0 12 1   Medications   (very important)  New medications from outside sources are available for reconciliation  acetaminophen (TYLENOL) 500 MG tablet albuterol (PROVENTIL HFA;VENTOLIN HFA) 108 (90 Base) MCG/ACT inhaler ARNUITY ELLIPTA 200 MCG/ACT AEPB azelastine (ASTELIN) 0.1 % nasal spray b complex vitamins capsule calcium citrate-vitamin D (CITRACAL+D) 315-200 MG-UNIT tablet cetirizine HCl (ZYRTEC) 5 MG/5ML SOLN dextromethorphan-guaiFENesin (MUCINEX DM) 30-600 MG 12hr tablet Ergocalciferol (VITAMIN D2) 2000 units TABS Halcinonide 0.1 % CREA hydrOXYzine (VISTARIL) 25 MG capsule hyoscyamine (LEVSIN SL) 0.125 MG SL tablet ipratropium (ATROVENT) 0.06 % nasal spray lidocaine (XYLOCAINE) 2 % jelly LORazepam (ATIVAN) 0.5 MG tablet mometasone (NASONEX) 50 MCG/ACT nasal spray pantoprazole (PROTONIX) 40 MG  tablet phenylephrine (SUDAFED PE) 10 MG TABS tablet pimecrolimus (ELIDEL) 1 % cream Polyethylene Glycol 3350 (MIRALAX PO) Prasterone, DHEA, 25 MG CAPS Probiotic Product (PROBIOTIC PO) Simethicone (GAS-X PO) tizanidine (ZANAFLEX) 2 MG capsule traMADol (ULTRAM) 50 MG tablet triamcinolone cream (KENALOG) 0.1 %  Goals        . Client said she wants to talk with someone about her health issues and how she deals with them (pt-stated)     Current Barriers:  . Limited access to food . Mental Health Concerns  in patient with chronic diagnoses of IBS, Hyperlipidemia, GAD, GERD and Osteoporosis . Pain issues  Clinical Social Work Clinical Goal(s):  Kristy Garza Kitchen Over the next 30 day days, client will work with LCSW to address concerns related to health issues of client and how she deals with health issues  Interventions:  Encouraged client to talk with RN CM about nursing needs of client  Talked with client about health issues of concern  Talked with client about pain issues of client  Talked with client about her current physical therapy sessions  Talked with Kristy Garza about her social support network (spouse)  Talked with client about relaxation techniques of choice (reading, listening to music, watching TV)  Talked with client about transport needs of client  Talked with client about her upcoming client appointments  Talked with client about sleeping issues   Patient Self Care Activities:  . Attends all scheduled provider appointments  . Self administers prescribed medications . Calls provider office for new concerns or questions  Plan: Patient to contact LCSW to discuss psychosocial needs of cllient LCSW  will call client in 4 weeks to discuss health needs of client  and client management of health needs Client to attend scheduled medical appointments Client to communicate with RN CM to discuss nursing needs of client  *Initial goal documentation       Follow Up Plan: LCSW to call  client in next 4 weeks to talk with client about health needs of client and about her management of health needs faced  [name].Sonja Manseau MSW, LCSW Licensed Clinical Social Worker [facility] Care Management [phone]

## 2019-04-10 NOTE — Patient Instructions (Addendum)
Licensed Clinical Social Worker Visit Information  Goals we discussed today:  Goals    . Client said she wants to talk with someone about her health issues and how she deals with them (pt-stated)     Current Barriers:  . Limited access to food . Mental Health Concerns  in patient with chronic diagnoses of IBS, Hyperlipidemia, GAD, GERD and Osteoporosis . Pain issues  Clinical Social Work Clinical Goal(s):  [name] Over the next 30 day days, client will work with LCSW to address concerns related to health issues of client and how she deals with health issues  Interventions: Encouraged client to talk with RN CM about nursing needs of client  Talked with client about health issues of concern  Talked with client about pain issues of client  Talked with client about her current physical therapy sessions  Talked with Philecia about her social support network (spouse)  Talked with client about relaxation techniques of choice (reading, listening to music, watching TV)  Talked with client about transport needs of client  Talked with client about her upcoming client appointments  Talked with client about sleeping issues  Patient Self Care Activities:  . Attends all scheduled provider appointments  . Self administers prescribed medications . Calls provider office for new concerns or questions  Plan: Patient to contact LCSW to discuss psychosocial needs of cllient LCSW will call client in 4 weeks to discuss health needs of client  and client management of health needs Client to attend scheduled medical appointments Client to communicate with RN CM to discuss nursing needs of client  *Initial goal documentation       Materials Provided: No  Follow Up Plan: LCSW to call client in next 4 weeks to talk about health needs of client and client management of health needs faced.  The patient verbalized understanding of instructions provided today and declined a print copy of patient instruction  materials.   [name].Yamilex Borgwardt MSW, LCSW Licensed Clinical Social Worker [facility] Care Management [phone]

## 2019-04-11 NOTE — Chronic Care Management (AMB) (Signed)
  Chronic Care Management   Note  04/10/2019 Name: Kristy Garza MRN: [id number] DOB: 1950-12-20  RN Care Plan    . "I would like to control the pain in my  face/jaw" (pt-stated)       Chronic fae/jaw pain in a patient with osteoporosis, trigeminal neuralgia, and TMJ arthralgia.  Current Barriers:  . Non-adherence to prescribed medication regimen due to anxiety over potential addiction . Generalized anxiety disorder  Nurse Case Manager Clinical Goal(s):  [name] Over the next 60 days, patient will demonstrate improved adherence to prescribed treatment plan for facial/jaw pain management as evidenced byself reported improvement in pain control. . Over the next 60 days, patient will keep appointments with integrative medicine specialist  Interventions:  . Chart review . Consulted by Theadore Nan, LCSW regarding chronic pain . Patient scheduled for RN outreach to discuss  Patient Self Care Activities:  . Performs ADL's independently . Performs IADL's independently  Please see past updates related to this goal by clicking on the "Past Updates" button in the selected goal           Follow up plan: RN will f/u with patient over the next 14 days  Chong Sicilian, BSN, RN-BC [facility] Direct Dial: [phone]

## 2019-04-11 NOTE — Patient Instructions (Signed)
Visit Information  Goals Addressed            This Visit's Progress     Patient Stated   . "I would like to control the pain in my  face/jaw" (pt-stated)       Current Barriers:  . Non-adherence to prescribed medication regimen due to anxiety over potential addiction . Generalized anxiety disorder  Nurse Case Manager Clinical Goal(s):  [name] Over the next 60 days, patient will demonstrate improved adherence to prescribed treatment plan for facial/jaw pain management as evidenced byself reported improvement in pain control. . Over the next 60 days, patient will keep appointments with integrative medicine specialist  Interventions:  . Chart review . Consulted by Theadore Nan, LCSW regarding chronic pain . Patient scheduled for RN outreach to discuss  Patient Self Care Activities:  . Performs ADL's independently . Performs IADL's independently  Please see past updates related to this goal by clicking on the "Past Updates" button in the selected goal        Chong Sicilian, BSN, RN-BC [place] [facility] [place]: [phone]

## 2019-04-12 DIAGNOSIS — M26629 Arthralgia of temporomandibular joint, unspecified side: Secondary | ICD-10-CM | POA: Diagnosis not present

## 2019-04-12 DIAGNOSIS — R1030 Lower abdominal pain, unspecified: Secondary | ICD-10-CM | POA: Diagnosis not present

## 2019-04-12 DIAGNOSIS — R350 Frequency of micturition: Secondary | ICD-10-CM | POA: Diagnosis not present

## 2019-04-12 DIAGNOSIS — N3010 Interstitial cystitis (chronic) without hematuria: Secondary | ICD-10-CM | POA: Diagnosis not present

## 2019-04-16 ENCOUNTER — Other Ambulatory Visit: Payer: Medicare HMO

## 2019-04-16 ENCOUNTER — Ambulatory Visit: Payer: Medicare HMO | Admitting: Allergy and Immunology

## 2019-04-16 ENCOUNTER — Other Ambulatory Visit: Payer: Self-pay

## 2019-04-16 DIAGNOSIS — M810 Age-related osteoporosis without current pathological fracture: Secondary | ICD-10-CM | POA: Diagnosis not present

## 2019-04-16 DIAGNOSIS — K5700 Diverticulitis of small intestine with perforation and abscess without bleeding: Secondary | ICD-10-CM | POA: Diagnosis not present

## 2019-04-16 DIAGNOSIS — N951 Menopausal and female climacteric states: Secondary | ICD-10-CM | POA: Diagnosis not present

## 2019-04-16 DIAGNOSIS — K589 Irritable bowel syndrome without diarrhea: Secondary | ICD-10-CM | POA: Diagnosis not present

## 2019-04-16 DIAGNOSIS — D649 Anemia, unspecified: Secondary | ICD-10-CM | POA: Diagnosis not present

## 2019-04-16 DIAGNOSIS — R5381 Other malaise: Secondary | ICD-10-CM | POA: Diagnosis not present

## 2019-04-19 ENCOUNTER — Telehealth: Payer: Medicare HMO

## 2019-04-19 DIAGNOSIS — N3010 Interstitial cystitis (chronic) without hematuria: Secondary | ICD-10-CM | POA: Diagnosis not present

## 2019-04-19 DIAGNOSIS — R102 Pelvic and perineal pain: Secondary | ICD-10-CM | POA: Diagnosis not present

## 2019-04-19 DIAGNOSIS — R300 Dysuria: Secondary | ICD-10-CM | POA: Diagnosis not present

## 2019-04-23 DIAGNOSIS — N3000 Acute cystitis without hematuria: Secondary | ICD-10-CM | POA: Diagnosis not present

## 2019-04-26 DIAGNOSIS — N3010 Interstitial cystitis (chronic) without hematuria: Secondary | ICD-10-CM | POA: Diagnosis not present

## 2019-04-29 ENCOUNTER — Ambulatory Visit (INDEPENDENT_AMBULATORY_CARE_PROVIDER_SITE_OTHER): Payer: Medicare HMO | Admitting: Licensed Clinical Social Worker

## 2019-04-29 DIAGNOSIS — K581 Irritable bowel syndrome with constipation: Secondary | ICD-10-CM

## 2019-04-29 DIAGNOSIS — M810 Age-related osteoporosis without current pathological fracture: Secondary | ICD-10-CM

## 2019-04-29 DIAGNOSIS — F411 Generalized anxiety disorder: Secondary | ICD-10-CM

## 2019-04-29 DIAGNOSIS — E782 Mixed hyperlipidemia: Secondary | ICD-10-CM

## 2019-04-29 DIAGNOSIS — K219 Gastro-esophageal reflux disease without esophagitis: Secondary | ICD-10-CM

## 2019-04-29 NOTE — Patient Instructions (Addendum)
Licensed Clinical Social Worker Visit Information  Goals we discussed today:  Goals        . Client said she wants to talk with someone about her health issues and how she deals with them (pt-stated)     Current Barriers:  . Limited access to food . Mental Health Concerns  in patient with chronic diagnoses of IBS, Hyperlipidemia, GAD, GERD and Osteoporosis . Pain issues  Clinical Social Work Clinical Goal(s):  [name] Over the next 30 day days, client will work with LCSW to address concerns related to health issues of client and how she deals with health issues  Interventions:  Encouraged client to talk with RN CM about nursing needs of client  Talked with client about pain issues of client  Talked with client about her current physical therapy sessions  Talked with [place] about her social support network (spouse)  Talked with client about relaxation techniques of choice (reading, listening to music, watching TV)  Talked with client about transport needs of client  Talked with client about her upcoming client appointments  Talked with client about sleeping issues  Talked with client about fatigue issues of client  Patient Self Care Activities:  . Attends all scheduled provider appointments  . Self administers prescribed medications . Calls provider office for new concerns or questions  Plan: Patient to contact LCSW to discuss psychosocial needs of cllient LCSW will call client in 4 weeks to discuss health needs of client  and client management of health needs Client to attend scheduled medical appointments Client to communicate with RN CM to discuss nursing needs of client  *Initial goal documentation        Materials Provided: No  Follow Up Plan:  LCSW will call client in next 4 weeks to discuss health needs of client and client management of health needs faced  The patient verbalized understanding of instructions provided today and declined a print copy of patient  instruction materials.   [name].Rasha Ibe MSW, LCSW Licensed Clinical Social Worker [facility] Care Management [phone]

## 2019-04-29 NOTE — Chronic Care Management (AMB) (Signed)
Care Management Note   Kristy Garza is a 69 y.o. year old female who is a primary care patient of Kristy Norlander, DO. The CM team was consulted for assistance with chronic disease management and care coordination.   I reached out to Kristy Garza by phone today.   Review of patient status, including review of consultants reports, relevant laboratory and other test results, and collaboration with appropriate care team members and the patient's provider was performed as part of comprehensive patient evaluation and provision of chronic care management services.   Social determinants of health: risk of depression; risk of stress; risk of tobacco use; risk of financial strain    Office Visit from 08/28/2018 in [place]  PHQ-9 Total Score  0     GAD 7 : Generalized Anxiety Score 08/28/2018 05/23/2018 04/11/2018  Nervous, Anxious, on Edge 0 3 1  Control/stop worrying 0 2 0  Worry too much - different things 0 2 0  Trouble relaxing 0 2 0  Restless 0 1 0  Easily annoyed or irritable 0 1 0  Afraid - awful might happen 0 1 0  Total GAD 7 Score 0 12 1   Medications   (very important)  New medications from outside sources are available for reconciliation  acetaminophen (TYLENOL) 500 MG tablet albuterol (PROVENTIL HFA;VENTOLIN HFA) 108 (90 Base) MCG/ACT inhaler ARNUITY ELLIPTA 200 MCG/ACT AEPB azelastine (ASTELIN) 0.1 % nasal spray b complex vitamins capsule calcium citrate-vitamin D (CITRACAL+D) 315-200 MG-UNIT tablet cetirizine HCl (ZYRTEC) 5 MG/5ML SOLN dextromethorphan-guaiFENesin (MUCINEX DM) 30-600 MG 12hr tablet Ergocalciferol (VITAMIN D2) 2000 units TABS Halcinonide 0.1 % CREA hydrOXYzine (VISTARIL) 25 MG capsule hyoscyamine (LEVSIN SL) 0.125 MG SL tablet ipratropium (ATROVENT) 0.06 % nasal spray lidocaine (XYLOCAINE) 2 % jelly LORazepam (ATIVAN) 0.5 MG tablet mometasone (NASONEX) 50 MCG/ACT nasal spray pantoprazole (PROTONIX) 40 MG  tablet phenylephrine (SUDAFED PE) 10 MG TABS tablet pimecrolimus (ELIDEL) 1 % cream Polyethylene Glycol 3350 (MIRALAX PO) Prasterone, DHEA, 25 MG CAPS Probiotic Product (PROBIOTIC PO) Simethicone (GAS-X PO) tizanidine (ZANAFLEX) 2 MG capsule traMADol (ULTRAM) 50 MG tablet triamcinolone cream (KENALOG) 0.1 %  Goals        . Client said she wants to talk with someone about her health issues and how she deals with them (pt-stated)     Current Barriers:  . Limited access to food . Mental Health Concerns  in patient with chronic diagnoses of IBS, Hyperlipidemia, GAD, GERD and Osteoporosis . Pain issues  Clinical Social Work Clinical Goal(s):  Kristy Garza Over the next 30 day days, client will work with LCSW to address concerns related to health issues of client and how she deals with health issues  Interventions:  Encouraged client to talk with RN CM about nursing needs of client  Talked with client about pain issues of client  Talked with client about her current physical therapy sessions  Talked with Kristy Garza about her social support network (spouse)  Talked with client about relaxation techniques of choice (reading, listening to music, watching TV)  Talked with client about transport needs of client  Talked with client about her upcoming client appointments  Talked with client about sleeping issues  Talked with client about fatigue issues of client  Patient Self Care Activities:  . Attends all scheduled provider appointments  . Self administers prescribed medications . Calls provider office for new concerns or questions  Plan: Patient to contact LCSW to discuss psychosocial needs of cllient LCSW will call client in  4 weeks to discuss health needs of client  and client management of health needs Client to attend scheduled medical appointments Client to communicate with RN CM to discuss nursing needs of client  *Initial goal documentation          Follow Up Plan: LCSW to  call client in next 4 weeks to talk about health needs of client and about client management of health needs  Kristy Riffle.Garza Defino MSW, LCSW Licensed Clinical Social Worker [facility] Care Management [phone]

## 2019-04-30 ENCOUNTER — Telehealth: Payer: Medicare HMO

## 2019-05-01 ENCOUNTER — Ambulatory Visit: Payer: Medicare HMO | Admitting: *Deleted

## 2019-05-01 NOTE — Chronic Care Management (AMB) (Signed)
  Chronic Care Management   Outreach Note  05/01/2019 Name: Kristy Garza MRN: [id number] DOB: [birth year]  Referred by: [name], DO Reason for referral : Chronic Care Management (RN follow up)   An unsuccessful telephone follow-up was attempted today. The patient was referred to the case management team by for assistance with care management and care coordination.   Follow Up Plan: A HIPPA compliant phone message was left for the patient providing contact information and requesting a return call.  The care management team will reach out to the patient again over the next 30 days.    Chong Sicilian, BSN, RN-BC Embedded Chronic Care Manager [facility] / [facility] [facility] Direct Dial: [phone]

## 2019-05-04 ENCOUNTER — Other Ambulatory Visit: Payer: Self-pay | Admitting: Family Medicine

## 2019-05-04 DIAGNOSIS — G4763 Sleep related bruxism: Secondary | ICD-10-CM

## 2019-05-05 ENCOUNTER — Other Ambulatory Visit: Payer: Self-pay | Admitting: Family Medicine

## 2019-05-06 DIAGNOSIS — N3010 Interstitial cystitis (chronic) without hematuria: Secondary | ICD-10-CM | POA: Diagnosis not present

## 2019-05-06 NOTE — Telephone Encounter (Signed)
Needs to be seen

## 2019-05-10 ENCOUNTER — Other Ambulatory Visit: Payer: Self-pay | Admitting: Family Medicine

## 2019-05-10 ENCOUNTER — Telehealth: Payer: Self-pay | Admitting: Family Medicine

## 2019-05-10 MED ORDER — LIDOCAINE HCL 2 % EX GEL: CUTANEOUS | 0 refills | Status: DC

## 2019-05-10 NOTE — Telephone Encounter (Signed)
Will send additional tube but she should use VERY sparingly.  There is a potential for systemic absorption that can cause cardiac problems/ toxicity.

## 2019-05-10 NOTE — Telephone Encounter (Signed)
What is the name of the medication? Lidocaine hcl 2%jelly...30 ml  Have you contacted your pharmacy to request a refill? no  Which pharmacy would you like this sent to? [facility] [place]  Patient notified that their request is being sent to the clinical staff for review and that they should receive a call once it is complete. If they do not receive a call within 24 hours they can check with their pharmacy or our office.

## 2019-05-10 NOTE — Telephone Encounter (Signed)
Patient said the tube was very small and she has already used all of it.  Needs a refill of larger tubes if possible.

## 2019-05-10 NOTE — Telephone Encounter (Signed)
This was sent 05/06/19.  Should be ready for pick up

## 2019-05-13 DIAGNOSIS — N3010 Interstitial cystitis (chronic) without hematuria: Secondary | ICD-10-CM | POA: Diagnosis not present

## 2019-05-13 NOTE — Telephone Encounter (Signed)
Patient aware.

## 2019-05-20 DIAGNOSIS — N3010 Interstitial cystitis (chronic) without hematuria: Secondary | ICD-10-CM | POA: Diagnosis not present

## 2019-05-21 ENCOUNTER — Ambulatory Visit: Payer: Medicare HMO | Admitting: *Deleted

## 2019-05-21 NOTE — Chronic Care Management (AMB) (Signed)
  Chronic Care Management   Outreach Note  05/21/2019 Name: Kristy Garza MRN: [id number] DOB: 06-13-1950  Referred by: [name], DO Reason for referral : Chronic Care Management (RN Follow up)   An unsuccessful telephone follow-up was attempted today. The patient was referred to the case management team for assistance with care management and care coordination.   Follow Up Plan: A HIPPA compliant phone message was left for the patient providing contact information and requesting a return call.  The care management team will reach out to the patient again over the next 30 days.   Chong Sicilian, BSN, RN-BC Embedded Chronic Care Manager [facility] / [place] [facility] Direct Dial: [phone]

## 2019-05-23 DIAGNOSIS — M6281 Muscle weakness (generalized): Secondary | ICD-10-CM | POA: Diagnosis not present

## 2019-05-23 DIAGNOSIS — M62838 Other muscle spasm: Secondary | ICD-10-CM | POA: Diagnosis not present

## 2019-05-23 DIAGNOSIS — M26603 Bilateral temporomandibular joint disorder, unspecified: Secondary | ICD-10-CM | POA: Diagnosis not present

## 2019-05-23 DIAGNOSIS — R300 Dysuria: Secondary | ICD-10-CM | POA: Diagnosis not present

## 2019-05-23 DIAGNOSIS — R278 Other lack of coordination: Secondary | ICD-10-CM | POA: Diagnosis not present

## 2019-05-23 DIAGNOSIS — N3010 Interstitial cystitis (chronic) without hematuria: Secondary | ICD-10-CM | POA: Diagnosis not present

## 2019-05-24 DIAGNOSIS — N3000 Acute cystitis without hematuria: Secondary | ICD-10-CM | POA: Diagnosis not present

## 2019-05-27 ENCOUNTER — Ambulatory Visit (INDEPENDENT_AMBULATORY_CARE_PROVIDER_SITE_OTHER): Payer: Medicare HMO | Admitting: Licensed Clinical Social Worker

## 2019-05-27 DIAGNOSIS — E782 Mixed hyperlipidemia: Secondary | ICD-10-CM | POA: Diagnosis not present

## 2019-05-27 DIAGNOSIS — K219 Gastro-esophageal reflux disease without esophagitis: Secondary | ICD-10-CM

## 2019-05-27 DIAGNOSIS — F411 Generalized anxiety disorder: Secondary | ICD-10-CM

## 2019-05-27 DIAGNOSIS — M810 Age-related osteoporosis without current pathological fracture: Secondary | ICD-10-CM | POA: Diagnosis not present

## 2019-05-27 DIAGNOSIS — K581 Irritable bowel syndrome with constipation: Secondary | ICD-10-CM

## 2019-05-27 NOTE — Patient Instructions (Addendum)
Licensed Clinical Social Worker Visit Information  Goals we discussed today:  Goals    . Client said she wants to talk with someone about her health issues and how she deals with them (pt-stated)     Current Barriers:  . Limited access to food . Mental Health Concerns  in patient with chronic diagnoses of IBS, Hyperlipidemia, GAD, GERD and Osteoporosis . Pain issues  Clinical Social Work Clinical Goal(s):  [name] Over the next 30 day days, client will work with LCSW to address concerns related to health issues of client and how she deals with health issues  Interventions:  Encouraged client to talk with RN CM about nursing needs of client  Talked with client about pain issues of client  Talked with client about her current physical therapy sessions  Talked with [place] about her social support network (spouse)  Talked with client about relaxation techniques of choice (reading, listening to music, watching TV)  Talked with client about transport needs of client  Talked with client about fatigue issues of client  Talked with client about sleeping issues  Talked with client about her upcoming client appointments  Patient Self Care Activities:  . Attends all scheduled provider appointments  . Self administers prescribed medications . Calls provider office for new concerns or questions  Plan: Patient to contact LCSW to discuss psychosocial needs of cllient LCSW will call client in next 4 weeks to discuss health needs of client  and client management of health needs Client to attend scheduled medical appointments Client to communicate with RN CM to discuss nursing needs of client  *Initial goal documentation       Materials Provided: No  Follow Up Plan: LCSW will call client in next 4 weeks to discuss health needs of client and about client management of health needs  The patient verbalized understanding of instructions provided today and declined a print copy of patient  instruction materials.   [name].Margorie Renner MSW, LCSW Licensed Clinical Social Worker [facility] [phone]

## 2019-05-27 NOTE — Chronic Care Management (AMB) (Signed)
Care Management Note   Kristy Garza is a 69 y.o. year old female who is a primary care patient of [name], DO. The CM team was consulted for assistance with chronic disease management and care coordination.   I reached out to [name] by phone today.    Review of patient status, including review of consultants reports, relevant laboratory and other test results, and collaboration with appropriate care team members and the patient's provider was performed as part of comprehensive patient evaluation and provision of chronic care management services.   Social determinants of health: risk of depression; risk of financial strain;risk of tobacco use    Office Visit from 08/28/2018 in [place]  PHQ-9 Total Score  0      GAD 7 : Generalized Anxiety Score 08/28/2018 05/23/2018 04/11/2018  Nervous, Anxious, on Edge 0 3 1  Control/stop worrying 0 2 0  Worry too much - different things 0 2 0  Trouble relaxing 0 2 0  Restless 0 1 0  Easily annoyed or irritable 0 1 0  Afraid - awful might happen 0 1 0  Total GAD 7 Score 0 12 1   Medications   (very important)  New medications from outside sources are available for reconciliation  acetaminophen (TYLENOL) 500 MG tablet albuterol (PROVENTIL HFA;VENTOLIN HFA) 108 (90 Base) MCG/ACT inhaler ARNUITY ELLIPTA 200 MCG/ACT AEPB azelastine (ASTELIN) 0.1 % nasal spray b complex vitamins capsule calcium citrate-vitamin D (CITRACAL+D) 315-200 MG-UNIT tablet cetirizine HCl (ZYRTEC) 5 MG/5ML SOLN dextromethorphan-guaiFENesin (MUCINEX DM) 30-600 MG 12hr tablet Ergocalciferol (VITAMIN D2) 2000 units TABS Halcinonide 0.1 % CREA hydrOXYzine (VISTARIL) 25 MG capsule hyoscyamine (LEVSIN SL) 0.125 MG SL tablet ipratropium (ATROVENT) 0.06 % nasal spray lidocaine (XYLOCAINE) 2 % jelly LORazepam (ATIVAN) 0.5 MG tablet mometasone (NASONEX) 50 MCG/ACT nasal spray pantoprazole (PROTONIX) 40 MG tablet phenylephrine  (SUDAFED PE) 10 MG TABS tablet pimecrolimus (ELIDEL) 1 % cream Polyethylene Glycol 3350 (MIRALAX PO) Prasterone, DHEA, 25 MG CAPS Probiotic Product (PROBIOTIC PO) Simethicone (GAS-X PO) tizanidine (ZANAFLEX) 2 MG capsule traMADol (ULTRAM) 50 MG tablet triamcinolone cream (KENALOG) 0.1 % UNABLE TO FIND  Goals    . Client said she wants to talk with someone about her health issues and how she deals with them (pt-stated)     Current Barriers:  . Limited access to food . Mental Health Concerns  in patient with chronic diagnoses of IBS, Hyperlipidemia, GAD, GERD and Osteoporosis . Pain issues  Clinical Social Work Clinical Goal(s):  [name] Over the next 30 day days, client will work with LCSW to address concerns related to health issues of client and how she deals with health issues  Interventions:  Encouraged client to talk with RN CM about nursing needs of client  Talked with client about pain issues of client  Talked with client about her current physical therapy sessions  Talked with [place] about her social support network (spouse)  Talked with client about relaxation techniques of choice (reading, listening to music, watching TV)  Talked with client about transport needs of client  Talked with client about fatigue issues of client  Talked with client about sleeping issues  Talked with client about her upcoming client appointments  Patient Self Care Activities:  . Attends all scheduled provider appointments  . Self administers prescribed medications . Calls provider office for new concerns or questions  Plan: Patient to contact LCSW to discuss psychosocial needs of cllient LCSW will call client in 4 weeks to  discuss health needs of client  and client management of health needs Client to attend scheduled medical appointments Client to communicate with RN CM to discuss nursing needs of client  *Initial goal documentation          Follow Up Plan: LCSW to call client in  next 4 weeks to talk about health needs of client and about client management of health needs  [name].Sharyon Peitz MSW, LCSW Licensed Clinical Social Worker [facility] [phone]

## 2019-05-29 DIAGNOSIS — N3000 Acute cystitis without hematuria: Secondary | ICD-10-CM | POA: Diagnosis not present

## 2019-06-03 DIAGNOSIS — N3010 Interstitial cystitis (chronic) without hematuria: Secondary | ICD-10-CM | POA: Diagnosis not present

## 2019-06-12 DIAGNOSIS — Z8639 Personal history of other endocrine, nutritional and metabolic disease: Secondary | ICD-10-CM | POA: Diagnosis not present

## 2019-06-12 DIAGNOSIS — K5730 Diverticulosis of large intestine without perforation or abscess without bleeding: Secondary | ICD-10-CM | POA: Diagnosis not present

## 2019-06-12 DIAGNOSIS — K5904 Chronic idiopathic constipation: Secondary | ICD-10-CM | POA: Diagnosis not present

## 2019-06-12 DIAGNOSIS — K599 Functional intestinal disorder, unspecified: Secondary | ICD-10-CM | POA: Diagnosis not present

## 2019-06-21 ENCOUNTER — Ambulatory Visit: Payer: Medicare HMO | Admitting: Family Medicine

## 2019-06-27 ENCOUNTER — Ambulatory Visit: Payer: Self-pay | Admitting: Licensed Clinical Social Worker

## 2019-06-27 DIAGNOSIS — F411 Generalized anxiety disorder: Secondary | ICD-10-CM

## 2019-06-27 DIAGNOSIS — K219 Gastro-esophageal reflux disease without esophagitis: Secondary | ICD-10-CM

## 2019-06-27 DIAGNOSIS — M810 Age-related osteoporosis without current pathological fracture: Secondary | ICD-10-CM

## 2019-06-27 DIAGNOSIS — K581 Irritable bowel syndrome with constipation: Secondary | ICD-10-CM

## 2019-06-27 DIAGNOSIS — E782 Mixed hyperlipidemia: Secondary | ICD-10-CM

## 2019-06-27 NOTE — Chronic Care Management (AMB) (Signed)
Care Management Note   Kristy Garza is a 69 y.o. year old female who is a primary care patient of [name], DO. The CM team was consulted for assistance with chronic disease management and care coordination.   I reached out to [name] by phone today.   Review of patient status, including review of consultants reports, relevant laboratory and other test results, and collaboration with appropriate care team members and the patient's provider was performed as part of comprehensive patient evaluation and provision of chronic care management services.   Social determinants of health: risk of tobacco use; risk of financial strain; risk of depression    Office Visit from 08/28/2018 in [place]  PHQ-9 Total Score  0     GAD 7 : Generalized Anxiety Score 08/28/2018 05/23/2018 04/11/2018  Nervous, Anxious, on Edge 0 3 1  Control/stop worrying 0 2 0  Worry too much - different things 0 2 0  Trouble relaxing 0 2 0  Restless 0 1 0  Easily annoyed or irritable 0 1 0  Afraid - awful might happen 0 1 0  Total GAD 7 Score 0 12 1   Medications   (very important)  New medications from outside sources are available for reconciliation  acetaminophen (TYLENOL) 500 MG tablet albuterol (PROVENTIL HFA;VENTOLIN HFA) 108 (90 Base) MCG/ACT inhaler ARNUITY ELLIPTA 200 MCG/ACT AEPB azelastine (ASTELIN) 0.1 % nasal spray b complex vitamins capsule calcium citrate-vitamin D (CITRACAL+D) 315-200 MG-UNIT tablet cetirizine HCl (ZYRTEC) 5 MG/5ML SOLN dextromethorphan-guaiFENesin (MUCINEX DM) 30-600 MG 12hr tablet Ergocalciferol (VITAMIN D2) 2000 units TABS Halcinonide 0.1 % CREA hydrOXYzine (VISTARIL) 25 MG capsule hyoscyamine (LEVSIN SL) 0.125 MG SL tablet ipratropium (ATROVENT) 0.06 % nasal spray lidocaine (XYLOCAINE) 2 % jelly LORazepam (ATIVAN) 0.5 MG tablet mometasone (NASONEX) 50 MCG/ACT nasal spray pantoprazole (PROTONIX) 40 MG tablet phenylephrine  (SUDAFED PE) 10 MG TABS tablet pimecrolimus (ELIDEL) 1 % cream Polyethylene Glycol 3350 (MIRALAX PO) Prasterone, DHEA, 25 MG CAPS Probiotic Product (PROBIOTIC PO) Simethicone (GAS-X PO) tizanidine (ZANAFLEX) 2 MG capsule traMADol (ULTRAM) 50 MG tablet triamcinolone cream (KENALOG) 0.1 %  Goals    . Client said she wants to talk with someone about her health issues and how she deals with them (pt-stated)     Current Barriers:  . Limited access to food . Mental Health Concerns  in patient with chronic diagnoses of IBS, Hyperlipidemia, GAD, GERD and Osteoporosis . Pain issues  Clinical Social Work Clinical Goal(s):  [name] Over the next 30 day days, client will work with LCSW to address concerns related to health issues of client and how she deals with health issues  Interventions:   Previously encouraged client to talk with RN CM about nursing needs of client  Previously talked with client about pain issues of client  Talked with [place] previously about her social support network (spouse)  Previously talked with client about relaxation techniques of choice (reading, listening to music, watching TV)  Talked with client previously about transport needs of client  Talked with client previously about fatigue issues of client  Previously talked with client about sleeping issues  Patient Self Care Activities:  . Attends all scheduled provider appointments  . Self administers prescribed medications . Calls provider office for new concerns or questions  Plan: Patient to contact LCSW to discuss psychosocial needs of cllient LCSW will call client in 4 weeks to discuss health needs of client  and client management of health needs Client to attend  scheduled medical appointments Client to communicate with RN CM to discuss nursing needs of client  *Initial goal documentation    Follow Up Plan: LCSW to call client in next 4 weeks to talk about the health needs of client and about client  management of health needs  [name].Kenyanna Grzesiak MSW, LCSW Licensed Clinical Social Worker [facility] [phone]

## 2019-06-27 NOTE — Patient Instructions (Addendum)
Licensed Clinical Social Worker Visit Information  Goals we discussed today:  Goals    . Client said she wants to talk with someone about her health issues and how she deals with them (pt-stated)     Current Barriers:  . Limited access to food . Mental Health Concerns  in patient with chronic diagnoses of IBS, Hyperlipidemia, GAD, GERD and Osteoporosis . Pain issues  Clinical Social Work Clinical Goal(s):  [name] Over the next 30 day days, client will work with LCSW to address concerns related to health issues of client and how she deals with health issues  Interventions:  Previously encouraged client to talk with RN CM about nursing needs of client  Previously talked with client about pain issues of client  Talked with [place] previously about her social support network (spouse)  Previously talked with client about relaxation techniques of choice (reading, listening to music, watching TV)  Talked with client previously about transport needs of client  Talked with client previously about fatigue issues of client  Previously talked with client about sleeping issues  Patient Self Care Activities:  . Attends all scheduled provider appointments  . Self administers prescribed medications . Calls provider office for new concerns or questions  Plan: Patient to contact LCSW to discuss psychosocial needs of cllient LCSW will call client in 4 weeks to discuss health needs of client  and client management of health needs Client to attend scheduled medical appointments Client to communicate with RN CM to discuss nursing needs of client  *Initial goal documentation      Materials Provided: No  Follow Up Plan: LCSW will call client in next 4 weeks to discuss health needs of client and client  management of health needs  The patient verbalized understanding of instructions provided today and declined a print copy of patient instruction materials.   [name].Jeray Shugart MSW, LCSW Licensed  Clinical Social Worker [facility] [phone]

## 2019-07-02 ENCOUNTER — Telehealth: Payer: Self-pay

## 2019-07-05 ENCOUNTER — Ambulatory Visit (INDEPENDENT_AMBULATORY_CARE_PROVIDER_SITE_OTHER): Payer: Medicare HMO | Admitting: Licensed Clinical Social Worker

## 2019-07-05 DIAGNOSIS — M810 Age-related osteoporosis without current pathological fracture: Secondary | ICD-10-CM | POA: Diagnosis not present

## 2019-07-05 DIAGNOSIS — E782 Mixed hyperlipidemia: Secondary | ICD-10-CM | POA: Diagnosis not present

## 2019-07-05 DIAGNOSIS — N3000 Acute cystitis without hematuria: Secondary | ICD-10-CM | POA: Diagnosis not present

## 2019-07-05 DIAGNOSIS — K581 Irritable bowel syndrome with constipation: Secondary | ICD-10-CM

## 2019-07-05 DIAGNOSIS — R109 Unspecified abdominal pain: Secondary | ICD-10-CM | POA: Diagnosis not present

## 2019-07-05 DIAGNOSIS — F411 Generalized anxiety disorder: Secondary | ICD-10-CM

## 2019-07-05 DIAGNOSIS — K219 Gastro-esophageal reflux disease without esophagitis: Secondary | ICD-10-CM

## 2019-07-05 NOTE — Patient Instructions (Addendum)
Licensed Clinical Social Worker Visit Information  Goals we discussed today:  Goals    . Client said she wants to talk with someone about her health issues and how she deals with them (pt-stated)     Current Barriers:  . Limited access to food . Mental Health Concerns  in patient with chronic diagnoses of IBS, Hyperlipidemia, GAD, GERD and Osteoporosis . Pain issues  Clinical Social Work Clinical Goal(s):  [name] Over the next 30 day days, client will work with LCSW to address concerns related to health issues of client and how she deals with health issues  Interventions:  Encouraged client to talk with RN CM about nursing needs of client  Talked with client about pain issues of client  Talked with [place] about her social support network (spouse)  Talked with client about relaxation techniques of choice (reading, listening to music, watching TV)  Talked with clientabout transport needs of client  Talked with clientabout fatigue issues of client  Talked with client about sleeping issues  Provided counseling support for client  Patient Self Care Activities:  . Attends all scheduled provider appointments  . Self administers prescribed medications . Calls provider office for new concerns or questions  Plan: Patient to contact LCSW to discuss psychosocial needs of cllient LCSW will call client in 4 weeks to discuss health needs of client  and client management of health needs Client to attend scheduled medical appointments Client to communicate with RN CM to discuss nursing needs of client  *Initial goal documentation        Materials Provided: No  Follow Up Plan: LCSW will call client in next 4 weeks to discuss health needs of client and client management of health needs faced  The patient verbalized understanding of instructions provided today and declined a print copy of patient instruction materials.   [name].Anastasija Anfinson MSW, LCSW Licensed Clinical Social  Worker [facility] [phone]

## 2019-07-05 NOTE — Chronic Care Management (AMB) (Signed)
Care Management Note   Kristy Garza is a 69 y.o. year old female who is a primary care patient of [name], DO. The CM team was consulted for assistance with chronic disease management and care coordination.   I reached out to [name] by phone today.   Review of patient status, including review of consultants reports, relevant laboratory and other test results, and collaboration with appropriate care team members and the patient's provider was performed as part of comprehensive patient evaluation and provision of chronic care management services.   Social determinants of health:risk of tobacco use; risk of depression; risk of financial strain    Office Visit from 08/28/2018 in [place]  PHQ-9 Total Score  0     GAD 7 : Generalized Anxiety Score 08/28/2018 05/23/2018 04/11/2018  Nervous, Anxious, on Edge 0 3 1  Control/stop worrying 0 2 0  Worry too much - different things 0 2 0  Trouble relaxing 0 2 0  Restless 0 1 0  Easily annoyed or irritable 0 1 0  Afraid - awful might happen 0 1 0  Total GAD 7 Score 0 12 1   Medications   (very important)  New medications from outside sources are available for reconciliation  acetaminophen (TYLENOL) 500 MG tablet albuterol (PROVENTIL HFA;VENTOLIN HFA) 108 (90 Base) MCG/ACT inhaler ARNUITY ELLIPTA 200 MCG/ACT AEPB azelastine (ASTELIN) 0.1 % nasal spray b complex vitamins capsule calcium citrate-vitamin D (CITRACAL+D) 315-200 MG-UNIT tablet cetirizine HCl (ZYRTEC) 5 MG/5ML SOLN dextromethorphan-guaiFENesin (MUCINEX DM) 30-600 MG 12hr tablet Ergocalciferol (VITAMIN D2) 2000 units TABS Halcinonide 0.1 % CREA hydrOXYzine (VISTARIL) 25 MG capsule hyoscyamine (LEVSIN SL) 0.125 MG SL tablet ipratropium (ATROVENT) 0.06 % nasal spray lidocaine (XYLOCAINE) 2 % jelly LORazepam (ATIVAN) 0.5 MG tablet mometasone (NASONEX) 50 MCG/ACT nasal spray pantoprazole (PROTONIX) 40 MG tablet phenylephrine (SUDAFED  PE) 10 MG TABS tablet pimecrolimus (ELIDEL) 1 % cream Polyethylene Glycol 3350 (MIRALAX PO) Prasterone, DHEA, 25 MG CAPS Probiotic Product (PROBIOTIC PO) Simethicone (GAS-X PO) tizanidine (ZANAFLEX) 2 MG capsule traMADol (ULTRAM) 50 MG tablet triamcinolone cream (KENALOG) 0.1 %  Goals    . Client said she wants to talk with someone about her health issues and how she deals with them (pt-stated)     Current Barriers:  . Limited access to food . Mental Health Concerns  in patient with chronic diagnoses of IBS, Hyperlipidemia, GAD, GERD and Osteoporosis . Pain issues  Clinical Social Work Clinical Goal(s):  [name] Over the next 30 day days, client will work with LCSW to address concerns related to health issues of client and how she deals with health issues  Interventions:  Encouraged client to talk with RN CM about nursing needs of client  Talked with client about pain issues of client  Talked with [place] about her social support network (spouse)  Talked with client about relaxation techniques of choice (reading, listening to music, watching TV)  Talked with client about transport needs of client  Talked with client about fatigue issues of client  Talked with client about sleeping issues  Provided counseling support for client  Patient Self Care Activities:  . Attends all scheduled provider appointments  . Self administers prescribed medications . Calls provider office for new concerns or questions  Plan: Patient to contact LCSW to discuss psychosocial needs of cllient LCSW will call client in 4 weeks to discuss health needs of client  and client management of health needs Client to attend scheduled medical appointments  Client to communicate with RN CM to discuss nursing needs of client  *Initial goal documentation       Follow Up Plan: LCSW to call client in next 4 weeks to talk about health needs of client and to talk about client management of health needs  faced  [name].Raylynne Cubbage MSW, LCSW Licensed Clinical Social Worker [facility] [phone]

## 2019-07-25 DIAGNOSIS — Z01812 Encounter for preprocedural laboratory examination: Secondary | ICD-10-CM | POA: Diagnosis not present

## 2019-07-25 DIAGNOSIS — Z20822 Contact with and (suspected) exposure to covid-19: Secondary | ICD-10-CM | POA: Diagnosis not present

## 2019-07-25 DIAGNOSIS — K581 Irritable bowel syndrome with constipation: Secondary | ICD-10-CM | POA: Diagnosis not present

## 2019-07-29 ENCOUNTER — Other Ambulatory Visit: Payer: Self-pay | Admitting: Allergy and Immunology

## 2019-07-30 ENCOUNTER — Other Ambulatory Visit: Payer: Self-pay

## 2019-07-30 ENCOUNTER — Ambulatory Visit (INDEPENDENT_AMBULATORY_CARE_PROVIDER_SITE_OTHER): Payer: Medicare HMO | Admitting: Allergy & Immunology

## 2019-07-30 ENCOUNTER — Telehealth: Payer: Self-pay | Admitting: Allergy

## 2019-07-30 ENCOUNTER — Encounter: Payer: Self-pay | Admitting: Allergy & Immunology

## 2019-07-30 DIAGNOSIS — K6389 Other specified diseases of intestine: Secondary | ICD-10-CM | POA: Diagnosis not present

## 2019-07-30 DIAGNOSIS — J4530 Mild persistent asthma, uncomplicated: Secondary | ICD-10-CM | POA: Diagnosis not present

## 2019-07-30 DIAGNOSIS — J310 Chronic rhinitis: Secondary | ICD-10-CM

## 2019-07-30 DIAGNOSIS — A049 Bacterial intestinal infection, unspecified: Secondary | ICD-10-CM | POA: Diagnosis not present

## 2019-07-30 DIAGNOSIS — J0101 Acute recurrent maxillary sinusitis: Secondary | ICD-10-CM | POA: Diagnosis not present

## 2019-07-30 MED ORDER — MOMETASONE FUROATE 50 MCG/ACT NA SUSP: NASAL | 5 refills | Status: DC

## 2019-07-30 MED ORDER — AZELASTINE HCL 0.1 % NA SOLN: 2.0000 | Freq: Two times a day (BID) | NASAL | 2 refills | Status: DC

## 2019-07-30 MED ORDER — ALBUTEROL SULFATE HFA 108 (90 BASE) MCG/ACT IN AERS: 2.0000 | INHALATION_SPRAY | RESPIRATORY_TRACT | 1 refills | Status: DC | PRN

## 2019-07-30 MED ORDER — ARNUITY ELLIPTA 200 MCG/ACT IN AEPB: INHALATION_SPRAY | RESPIRATORY_TRACT | 5 refills | Status: DC

## 2019-07-30 MED ORDER — PREDNISONE 10 MG PO TABS: ORAL_TABLET | ORAL | 0 refills | Status: DC

## 2019-07-30 NOTE — Progress Notes (Signed)
RE: Kristy Garza MRN: [id number] DOB: 1950/08/16 Date of Telemedicine Visit: 07/30/2019  Referring provider: [name], DO Primary care provider: [name], DO  Chief Complaint: Sinus Problem (Nasal congestion started last week. ), Headache, and Cough   Telemedicine Follow Up Visit via Telephone: I connected with Kristy Garza for a follow up on 07/30/19 by telephone and verified that I am speaking with the correct person using two identifiers.   I discussed the limitations, risks, security and privacy concerns of performing an evaluation and management service by telephone and the availability of in person appointments. I also discussed with the patient that there may be a patient responsible charge related to this service. The patient expressed understanding and agreed to proceed.  Patient is at home.  Provider is at the office.  Visit start time: 5:20 PM Visit end time: 5:29 PM Insurance consent/check in by: Mel Almond Medical consent and medical assistant/nurse: Garlon Hatchet  History of Present Illness:  Kristy Garza is a 69 y.o. female presenting for a sick visit.  She was last seen in October 2020 by Dr. Nelva Bush.  At that time, she continued to have a cough that was no better with 3 different antibiotics.  Dr. Nelva Bush felt like symptoms were likely viral.  She stopped the Allegra and started Ryclora 2 mg twice daily.  Her Nasonex was changed to Nasacort.  She was continued on Astelin.  Mucinex was added on as well.  Sinus CT was discussed.  Her lung testing looked great.  She was continued on Arnuity 1 puff once daily.  Her sinus CT in November showed postoperative changes with mucosal thickening in the paranasal sinuses slightly improved.  There were no cysts seen or air-fluid levels.   In the interim, she reports pressure over her temples with headaches, postnasal drip, and some congestion. She also reports a cough. It is occasionally productive and sometimes not  productive. She has had symptoms since last Wednesday (now on day #7). She was outdoors with her grandchildren on Sunday.  She was sick last in the fall 2020. She has been well since that time. She last saw an ENT in [place].   Asthma/Respiratory Symptom History: She remains on the Arnuity one puff once daily. She is using her rescue inhaler very rarely. She has not used it in quite some time.  Tracye's asthma has been well controlled. She has not required rescue medication, experienced nocturnal awakenings due to lower respiratory symptoms, nor have activities of daily living been limited. She has required no Emergency Department or Urgent Care visits for her asthma. She has required zero courses of systemic steroids for asthma exacerbations since the last visit. ACT score today is 21, indicating excellent asthma symptom control.    Allergic Rhinitis Symptom History: She remains on all of her nose sprays.  She has been very compliant with them.  She is also on her Allegra since her Ryclora was not covered well.  She has not needed antibiotics at all since the last visit.  Otherwise, there have been no changes to her past medical history, surgical history, family history, or social history.  Assessment and Plan:  Kristy Garza is a 69 y.o. female with:  Asthma, well controlled, mild persistent  Chronic rhinitis   Kristy Garza is doing fairly well at this point.  She thinks that her current symptoms are likely due to pollen exposure on Sunday, which does seem reasonable.  Given that, I think prednisone would be a better place to  start for her compared to an antibiotic.  She is perfectly fine with that but is aware that she has osteoporosis and would like to limit the steroid dose and duration.  Therefore, we are going to do 10 mg twice daily for 5 days.  I recommended that she continue with all of her nasal hygiene regimen since this collectively could be helping.  She is also on Mucinex DM, which is  helping somewhat.  She declines wanting any other additional cough suppressant.  She is going to give [name] an update on Thursday or Friday to let us know how she is doing.  We can certainly add on an antibiotic if indicated at that point in time.  Diagnostics: None.  Medication List:  Current Outpatient Medications  Medication Sig Dispense Refill  . acetaminophen (TYLENOL) 500 MG tablet Take by mouth.    [name] albuterol (VENTOLIN HFA) 108 (90 Base) MCG/ACT inhaler Inhale 2 puffs into the lungs every 4 (four) hours as needed for wheezing or shortness of breath. 18 g 1  . azelastine (ASTELIN) 0.1 % nasal spray Place 2 sprays into both nostrils 2 (two) times daily. Use in each nostril as directed 30 mL 2  . b complex vitamins capsule Take 1 capsule by mouth daily.    . calcium citrate-vitamin D (CITRACAL+D) 315-200 MG-UNIT tablet Take 2 tablets by mouth 2 (two) times daily.    . cetirizine HCl (ZYRTEC) 5 MG/5ML SOLN Take 5 mg by mouth daily.    [name] dextromethorphan-guaiFENesin (MUCINEX DM) 30-600 MG 12hr tablet Take 1 tablet by mouth 2 (two) times daily.    . Ergocalciferol (VITAMIN D2) 2000 units TABS Take 2,000 Units by mouth daily.    . Fluticasone Furoate (ARNUITY ELLIPTA) 200 MCG/ACT AEPB TAKE 1 PUFF BY MOUTH EVERY DAY 30 each 5  . Halcinonide 0.1 % CREA Apply thin layer to skin twice daily as needed 60 g 2  . hydrOXYzine (VISTARIL) 25 MG capsule Take 1 tablet by mouth at bedtime as needed for itch 30 capsule 3  . hyoscyamine (LEVSIN SL) 0.125 MG SL tablet PLEASE SEE ATTACHED FOR DETAILED DIRECTIONS    . ipratropium (ATROVENT) 0.06 % nasal spray 2 sprays by Each Nare route Three (3) times a day.    . lidocaine (XYLOCAINE) 2 % jelly APPLY TOPICALLY TO AFFECTED AREA EVERY DAY AS NEEDED (use sparingly) 85 g 0  . LORazepam (ATIVAN) 0.5 MG tablet Take 1 tablet (0.5 mg total) by mouth 2 (two) times daily as needed for anxiety. 30 tablet 0  . mometasone (NASONEX) 50 MCG/ACT nasal spray PLACE 2 SPRAYS INTO  THE NOSE DAILY. 17 g 5  . pantoprazole (PROTONIX) 40 MG tablet Take 1 tablet (40 mg total) by mouth daily. 30 tablet 5  . phenylephrine (SUDAFED PE) 10 MG TABS tablet Take 10 mg by mouth every 4 (four) hours as needed.    . pimecrolimus (ELIDEL) 1 % cream Apply topically 2 (two) times daily. 30 g 2  . Polyethylene Glycol 3350 (MIRALAX PO) Take by mouth as needed.    . Prasterone, DHEA, 25 MG CAPS Take 2 mg by mouth daily.    . Probiotic Product (PROBIOTIC PO) Take by mouth daily.    . Simethicone (GAS-X PO) Take by mouth.    . tizanidine (ZANAFLEX) 2 MG capsule Take 1-2 capsules (2-4 mg total) by mouth 3 (three) times daily as needed for muscle spasms. 60 capsule 2  . traMADol (ULTRAM) 50 MG tablet Take 1 tablet (50  mg total) by mouth every 12 (twelve) hours as needed. 30 tablet 1  . triamcinolone cream (KENALOG) 0.1 % Apply 1 application topically 2 (two) times daily. 80 g 1  . UNABLE TO FIND Take 1 capsule by mouth daily. Dv3- vitamin D 3 plus immune support    . predniSONE (DELTASONE) 10 MG tablet Take one tablet with food twice daily for five days. 10 tablet 0   No current facility-administered medications for this visit.   Allergies: Allergies  Allergen Reactions  . Cefuroxime Axetil Other (See Comments)    Extreme gas  . Gabapentin Other (See Comments)    Constipation Constipation  . Montelukast Other (See Comments)    Numbness and tingling in hand. Numbness and tingling in hand. Numbness and tingling in hand. NUMBNESS OF HANDS AND FEET. Numbness in hands and feet NUMBNESS OF HANDS AND FEET.  . Pregabalin Other (See Comments)    Drowsiness, dry mouth Drowsiness, dry mouth Makes her tired and thirsty  . Augmentin [Amoxicillin-Pot Clavulanate] Diarrhea  . Avelox [Moxifloxacin Hcl In Nacl] Other (See Comments)    Tremors   . Biaxin [Clarithromycin]     Unsure of reaction  . Cedax [Ceftibuten] Other (See Comments)    tremors  . Ciprofloxacin Other (See Comments) and  Nausea And Vomiting    Blurred vision Blurred vision Pt can't remember reaction  . Clindamycin/Lincomycin     tachycardia  . Levofloxacin Other (See Comments)    Feels dehydrated  . Lincomycin Other (See Comments)    tachycardia  . Montelukast Sodium Other (See Comments)    Numbness and tingling in hand. Numbness and tingling in hand.  . Sulfonamide Derivatives Other (See Comments)    Gi upset   I reviewed her past medical history, social history, family history, and environmental history and no significant changes have been reported from previous visits.  Review of Systems  Constitutional: Negative for activity change, appetite change, chills, diaphoresis and fatigue.  HENT: Positive for congestion, postnasal drip, rhinorrhea, sinus pressure and sinus pain. Negative for sore throat.   Eyes: Negative for pain, discharge, redness and itching.  Respiratory: Negative for shortness of breath, wheezing and stridor.   Gastrointestinal: Negative for diarrhea, nausea and vomiting.  Musculoskeletal: Negative for arthralgias, joint swelling and myalgias.  Skin: Negative for rash.  Allergic/Immunologic: Negative for environmental allergies and food allergies.    Objective:  Physical exam not obtained as encounter was done via telephone.   Previous notes and tests were reviewed.  I discussed the assessment and treatment plan with the patient. The patient was provided an opportunity to ask questions and all were answered. The patient agreed with the plan and demonstrated an understanding of the instructions.   The patient was advised to call back or seek an in-person evaluation if the symptoms worsen or if the condition fails to improve as anticipated.  I provided 20 minutes of non-face-to-face time during this encounter.  It was my pleasure to participate in [place] care today. Please feel free to contact me with any questions or concerns.   Sincerely,  Valentina Shaggy,  MD

## 2019-07-30 NOTE — Telephone Encounter (Signed)
Called and spoke with the patient and she stated that she has been having issues with sinus pressure, congestion, a dry cough that sometimes produces green sputum. She states that she has been taking Mucinex DM liquid every 4 hours and Phenylephrine and Tylenol for her headaches. She states that she is also taking Allegra, Astelin, and Nasonex for her symptoms. But she is not seeing much benefit. Please advise.

## 2019-07-30 NOTE — Telephone Encounter (Signed)
Patient called and wanted to talk with a nurse , she has sore ,throat, coughing and has green mucus and no fever . [facility]. [phone].

## 2019-07-30 NOTE — Telephone Encounter (Signed)
Patient was not able to make it to the office so we did a televisit with Dr. Ernst Bowler today for patient.

## 2019-07-30 NOTE — Telephone Encounter (Signed)
Please inform patient that she can come to see me in [place] tomorrow or she can come to [place] to see one of our providers tomorrow.  She has apparently seen Dr. Nelva Bush in October 2020 but I have not seen her since 2019.

## 2019-08-02 ENCOUNTER — Telehealth: Payer: Self-pay

## 2019-08-02 ENCOUNTER — Encounter: Payer: Self-pay | Admitting: Family Medicine

## 2019-08-02 ENCOUNTER — Other Ambulatory Visit: Payer: Self-pay

## 2019-08-02 ENCOUNTER — Ambulatory Visit: Payer: Medicare HMO | Admitting: Family Medicine

## 2019-08-02 VITALS — BP 102/70 | HR 84 | Temp 98.2°F | Resp 16 | Ht 68.0 in | Wt 110.6 lb

## 2019-08-02 DIAGNOSIS — J310 Chronic rhinitis: Secondary | ICD-10-CM

## 2019-08-02 DIAGNOSIS — J4530 Mild persistent asthma, uncomplicated: Secondary | ICD-10-CM

## 2019-08-02 DIAGNOSIS — K219 Gastro-esophageal reflux disease without esophagitis: Secondary | ICD-10-CM

## 2019-08-02 DIAGNOSIS — R05 Cough: Secondary | ICD-10-CM

## 2019-08-02 DIAGNOSIS — R059 Cough, unspecified: Secondary | ICD-10-CM

## 2019-08-02 DIAGNOSIS — J0100 Acute maxillary sinusitis, unspecified: Secondary | ICD-10-CM

## 2019-08-02 DIAGNOSIS — J0101 Acute recurrent maxillary sinusitis: Secondary | ICD-10-CM

## 2019-08-02 MED ORDER — DOXYCYCLINE HYCLATE 100 MG PO CAPS: 100.0000 mg | ORAL_CAPSULE | Freq: Two times a day (BID) | ORAL | 0 refills | Status: DC

## 2019-08-02 NOTE — Telephone Encounter (Signed)
Patient called the office and states prednisone was called in this week to help with her congestion, but it is not helping and states she may need antibiotics. Patient had a televisit with Dr. Ernst Bowler on 07/30/2019 and would like to come into the office today. Patient is scheduled with Anne at 1:30pm.   Please advise.

## 2019-08-02 NOTE — Progress Notes (Signed)
Kristy Garza [id number] Dept: [phone]  FOLLOW UP NOTE  Patient ID: Kristy Garza, female    DOB: 13-May-1950  Age: 69 y.o. MRN: [id number] Date of Office Visit: 08/02/2019  Assessment  Chief Complaint: Sinusitis (nasal congestion, cough, headaches, bodyaches)  HPI Kristy Garza is a 69 year old female who presents to the clinic for follow-up visit.  She was last seen in this clinic on 08/19/2019 by Dr. Ernst Bowler for evaluation of asthma, allergic rhinitis, and reflux.  At that time she was reporting significant congestion which occurred after sitting on the porch outside for which she was prescribed prednisone 10 mg once a day for 5 days.  At today's visit, she reports that her congestion has not improved and she has additional symptoms including thick nasal drainage, thick postnasal drainage, and cough with occasional clear to green and back to clear mucus.  She reports pain in the area above her teeth as well as headache over the frontal sinus that began about 3 or 4 days ago she denies fever and sick contacts.  She is currently taking Allegra or Benadryl almost daily, using Nasonex daily, azelastine daily, and saline nasal rinses daily.  She reports her asthma has been well controlled with Arnuity and albuterol 2-3 times this week.  Reflux is reported as well controlled with Protonix 40 mg once a day.  Her current medications are listed in the chart.   Drug Allergies:  Allergies  Allergen Reactions  . Cefuroxime Axetil Other (See Comments)    Extreme gas  . Gabapentin Other (See Comments)    Constipation Constipation  . Montelukast Other (See Comments)    Numbness and tingling in hand. Numbness and tingling in hand. Numbness and tingling in hand. NUMBNESS OF HANDS AND FEET. Numbness in hands and feet NUMBNESS OF HANDS AND FEET.  . Pregabalin Other (See Comments)    Drowsiness, dry mouth Drowsiness, dry mouth Makes her tired and thirsty  . Augmentin  [Amoxicillin-Pot Clavulanate] Diarrhea  . Avelox [Moxifloxacin Hcl In Nacl] Other (See Comments)    Tremors   . Biaxin [Clarithromycin]     Unsure of reaction  . Cedax [Ceftibuten] Other (See Comments)    tremors  . Ciprofloxacin Other (See Comments) and Nausea And Vomiting    Blurred vision Blurred vision Pt can't remember reaction  . Clindamycin/Lincomycin     tachycardia  . Levofloxacin Other (See Comments)    Feels dehydrated  . Lincomycin Other (See Comments)    tachycardia  . Montelukast Sodium Other (See Comments)    Numbness and tingling in hand. Numbness and tingling in hand.  . Sulfonamide Derivatives Other (See Comments)    Gi upset    Physical Exam: BP 102/70 (BP Location: Right Arm, Patient Position: Sitting, Cuff Size: Normal)   Pulse 84   Temp 98.2 F (36.8 C) (Temporal)   Resp 16   Ht 5\' 8"  (1.727 m)   Wt 110 lb 9.6 oz (50.2 kg)   SpO2 96%   BMI 16.82 kg/m    Physical Exam Vitals reviewed.  Constitutional:      Appearance: Normal appearance.  HENT:     Head: Normocephalic and atraumatic.     Right Ear: Tympanic membrane normal.     Left Ear: Tympanic membrane normal.     Nose:     Comments: Bilateral nares slightly erythematous with clear nasal drainage noted.  Pharynx slightly erythematous with no exudate.  Ears normal.  Eyes normal. Eyes:  Conjunctiva/sclera: Conjunctivae normal.  Cardiovascular:     Rate and Rhythm: Normal rate and regular rhythm.     Heart sounds: Normal heart sounds. No murmur.  Pulmonary:     Effort: Pulmonary effort is normal.     Breath sounds: Normal breath sounds.     Comments: Lungs clear to auscultation Musculoskeletal:        General: Normal range of motion.     Cervical back: Normal range of motion and neck supple.  Skin:    General: Skin is warm and dry.  Neurological:     Mental Status: She is alert and oriented to person, place, and time.  Psychiatric:        Mood and Affect: Mood normal.         Behavior: Behavior normal.        Thought Content: Thought content normal.        Judgment: Judgment normal.     Diagnostics: FVC 3.27, FEV1 2.41.  Predicted FVC 3.58, predicted FEV1 2.73.  Spirometry indicates normal ventilatory function.  Assessment and Plan: 1. Mild persistent asthma without complication   2. Acute non-recurrent maxillary sinusitis   3. Chronic rhinitis   4. Gastroesophageal reflux disease, unspecified whether esophagitis present   5. Cough     No orders of the defined types were placed in this encounter.   Patient Instructions  Acute sinusitis Begin Doxycyclin 100 mg twice a day for 10 days.  Continue saline rinses at least once a day Continue Mucinex [phone] mg twice a day and increase hydration  Allergic rhinitis Stop Allegra and begin cetirizine 5 mg once a day as needed for a runy nose Continue Nasonex 2 sprays in each nostril once a day as needed for stuffy nose Continue saline rinses at least once a day Continue azelastine 2 sprays in each nostril twice a day as needed for nasal symptoms Return to the clinic for environmental allergy skin testing when it is convenient for you. Remember to stop antihistamines for 3 days before the testing day  Asthma Increase Arnuity Ellipta 1 puff every day Continue albuterol 2 puffs every 4 hours as needed for cough or wheeze  Cough Begin the treatment plans as listed above  If your cough is not better by Monday, let us proceed with a chest x-ray If you develop a fever or your cough worsens call the clinic or go to the emergency department for assistance  Reflux Continue dietary and lifestyle modifications as listed below  Call the clinic if this treatment plan is not working well for you  Follow up in 2 weeks or sooner if needed.   Return in about 2 weeks (around 08/16/2019), or if symptoms worsen or fail to improve.    Thank you for the opportunity to care for this patient.  Please do not hesitate to  contact me with questions.  Gareth Morgan, FNP [facility]

## 2019-08-02 NOTE — Patient Instructions (Addendum)
Acute sinusitis Begin Doxycyclin 100 mg twice a day for 10 days.  Continue saline rinses at least once a day Continue Mucinex [phone] mg twice a day and increase hydration  Allergic rhinitis Stop Allegra and begin cetirizine 5 mg once a day as needed for a runy nose Continue Nasonex 2 sprays in each nostril once a day as needed for stuffy nose Continue saline rinses at least once a day Continue azelastine 2 sprays in each nostril twice a day as needed for nasal symptoms Return to the clinic for environmental allergy skin testing when it is convenient for you. Remember to stop antihistamines for 3 days before the testing day  Asthma Increase Arnuity Ellipta 1 puff every day Continue albuterol 2 puffs every 4 hours as needed for cough or wheeze  Cough Begin the treatment plans as listed above  If your cough is not better by Monday, let us proceed with a chest x-ray If you develop a fever or your cough worsens call the clinic or go to the emergency department for assistance  Reflux Continue dietary and lifestyle modifications as listed below  Call the clinic if this treatment plan is not working well for you  Follow up in 1 month or sooner if needed.   Lifestyle Changes for Controlling GERD When you have GERD, stomach acid feels as if it's backing up toward your mouth. Whether or not you take medication to control your GERD, your symptoms can often be improved with lifestyle changes.   Raise Your Head  Reflux is more likely to strike when you're lying down flat, because stomach fluid can  flow backward more easily. Raising the head of your bed 4-6 inches can help. To do this:  Slide blocks or books under the legs at the head of your bed. Or, place a wedge under  the mattress. Many foam stores can make a suitable wedge for you. The wedge  should run from your waist to the top of your head.  Don't just prop your head on several pillows. This increases pressure on  your  stomach. It can make GERD worse.  Watch Your Eating Habits Certain foods may increase the acid in your stomach or relax the lower esophageal sphincter, making GERD more likely. It's best to avoid the following:  Coffee, tea, and carbonated drinks (with and without caffeine)  Fatty, fried, or spicy food  Mint, chocolate, onions, and tomatoes  Any other foods that seem to irritate your stomach or cause you pain  Relieve the Pressure  Eat smaller meals, even if you have to eat more often.  Don't lie down right after you eat. Wait a few hours for your stomach to empty.  Avoid tight belts and tight-fitting clothes.  Lose excess weight.  Tobacco and Alcohol  Avoid smoking tobacco and drinking alcohol. They can make GERD symptoms worse.

## 2019-08-02 NOTE — Telephone Encounter (Signed)
Ambs, Kathrine Cords, FNP  [facility]  Can you please call this patient and have her increase her arnuity ellipta to one puff every day. Thank you   Sent a my chart message.  Also, left message on patient's voice mail to call back if she has any questions.

## 2019-08-05 ENCOUNTER — Ambulatory Visit (INDEPENDENT_AMBULATORY_CARE_PROVIDER_SITE_OTHER): Payer: Medicare HMO | Admitting: Licensed Clinical Social Worker

## 2019-08-05 DIAGNOSIS — E782 Mixed hyperlipidemia: Secondary | ICD-10-CM | POA: Diagnosis not present

## 2019-08-05 DIAGNOSIS — F411 Generalized anxiety disorder: Secondary | ICD-10-CM

## 2019-08-05 DIAGNOSIS — M810 Age-related osteoporosis without current pathological fracture: Secondary | ICD-10-CM | POA: Diagnosis not present

## 2019-08-05 DIAGNOSIS — K581 Irritable bowel syndrome with constipation: Secondary | ICD-10-CM

## 2019-08-05 DIAGNOSIS — K219 Gastro-esophageal reflux disease without esophagitis: Secondary | ICD-10-CM

## 2019-08-05 NOTE — Telephone Encounter (Signed)
OK thank you. Can you check on how she is feeling please? Breathing and sinus pressure? Thank you

## 2019-08-05 NOTE — Telephone Encounter (Signed)
Kristy Garza please advise as patient is already doing 1 puff once daily.

## 2019-08-05 NOTE — Patient Instructions (Addendum)
Licensed Clinical Social Worker Visit Information  Goals we discussed today:  Goals    . Client said she wants to talk with someone about her health issues and how she deals with them (pt-stated)     Current Barriers:  . Limited access to food . Mental Health Concerns  in patient with chronic diagnoses of IBS, Hyperlipidemia, GAD, GERD and Osteoporosis . Pain issues  Clinical Social Work Clinical Goal(s):  [name] Over the next 30 day days, client will work with LCSW to address concerns related to health issues of client and how she deals with health issues  Interventions:  Encouraged client to talk with RN CM about nursing needs of client  Talked with client about pain issues of client  Talked with Ryelyn about her social support network (spouse)  Talked with client about relaxation techniques of choice (reading, listening to music, watching TV)  Talked with client about transport needs of client  Talked with client about fatigue issues of client  Talked with client about sleeping issues  Talked with client about her recent medical appointment related to her allergy symptoms  Talked with client about her upcoming medical appointments  Talked with client about her mood (she said she thought her mood was stable; she said she does not take Ativan each day but only takes Ativan when needed)  Talked with client about support through CCM program  Patient Self Care Activities:  . Attends all scheduled provider appointments  . Self administers prescribed medications . Calls provider office for new concerns or questions  Plan: Patient to contact LCSW to discuss psychosocial needs of cllient LCSW will call client in 4 weeks to discuss health needs of client  and client management of health needs Client to attend scheduled medical appointments Client to communicate with RN CM to discuss nursing needs of client  *Initial goal documentation      Materials Provided:  No  Follow Up Plan: LCSW  to call client in next 4 weeks to discuss health needs of client and to discuss client management of health needs faced  The patient verbalized understanding of instructions provided today and declined a print copy of patient instruction materials.   [name].Tine Mabee MSW, LCSW Licensed Clinical Social Worker [facility] [phone]

## 2019-08-05 NOTE — Telephone Encounter (Signed)
Patient called and states someone was sending her a message on mychart, but she did not have it yet. Informed patient to increase Arnuity Ellipta to one puff every day and patient states she is already doing one puff everyday.  Please advise.

## 2019-08-05 NOTE — Chronic Care Management (AMB) (Signed)
Chronic Care Management    Clinical Social Work Follow Up Note  08/05/2019 Name: Kristy Garza MRN: [id number] DOB: 1950-04-13  Kristy Garza is a 69 y.o. year old female who is a primary care patient of [name], DO. The CCM team was consulted for assistance with [employer].   Review of patient status, including review of consultants reports, other relevant assessments, and collaboration with appropriate care team members and the patient's provider was performed as part of comprehensive patient evaluation and provision of chronic care management services.    SDOH (Social Determinants of Health) assessments performed: Yes;risk for depression; risk for financial concerns; risk for tobacco use    Office Visit from 08/28/2018 in [facility]  PHQ-9 Total Score  0       GAD 7 : Generalized Anxiety Score 08/28/2018 05/23/2018 04/11/2018  Nervous, Anxious, on Edge 0 3 1  Control/stop worrying 0 2 0  Worry too much - different things 0 2 0  Trouble relaxing 0 2 0  Restless 0 1 0  Easily annoyed or irritable 0 1 0  Afraid - awful might happen 0 1 0  Total GAD 7 Score 0 12 1    Outpatient Encounter Medications as of 08/05/2019  Medication Sig Note  . acetaminophen (TYLENOL) 500 MG tablet Take by mouth. 01/07/2016: Received from: [facility] Received Sig: Take by mouth.  [name] albuterol (VENTOLIN HFA) 108 (90 Base) MCG/ACT inhaler Inhale 2 puffs into the lungs every 4 (four) hours as needed for wheezing or shortness of breath.   [name] azelastine (ASTELIN) 0.1 % nasal spray Place 2 sprays into both nostrils 2 (two) times daily. Use in each nostril as directed   . b complex vitamins capsule Take 1 capsule by mouth daily.   . calcium citrate-vitamin D (CITRACAL+D) 315-200 MG-UNIT tablet Take 2 tablets by mouth 2 (two) times daily.   . cetirizine HCl (ZYRTEC) 5 MG/5ML SOLN Take 5 mg by mouth daily.   [name] dextromethorphan-guaiFENesin (MUCINEX  DM) 30-600 MG 12hr tablet Take 1 tablet by mouth 2 (two) times daily.   [name] doxycycline (VIBRAMYCIN) 100 MG capsule Take 1 capsule (100 mg total) by mouth 2 (two) times daily.   . Ergocalciferol (VITAMIN D2) 2000 units TABS Take 2,000 Units by mouth daily.   . Fluticasone Furoate (ARNUITY ELLIPTA) 200 MCG/ACT AEPB TAKE 1 PUFF BY MOUTH EVERY DAY   . Halcinonide 0.1 % CREA Apply thin layer to skin twice daily as needed   . hydrOXYzine (VISTARIL) 25 MG capsule Take 1 tablet by mouth at bedtime as needed for itch   . hyoscyamine (LEVSIN SL) 0.125 MG SL tablet PLEASE SEE ATTACHED FOR DETAILED DIRECTIONS   . ipratropium (ATROVENT) 0.06 % nasal spray 2 sprays by Each Nare route Three (3) times a day.   . lidocaine (XYLOCAINE) 2 % jelly APPLY TOPICALLY TO AFFECTED AREA EVERY DAY AS NEEDED (use sparingly)   . LORazepam (ATIVAN) 0.5 MG tablet Take 1 tablet (0.5 mg total) by mouth 2 (two) times daily as needed for anxiety.   . mometasone (NASONEX) 50 MCG/ACT nasal spray PLACE 2 SPRAYS INTO THE NOSE DAILY.   . pantoprazole (PROTONIX) 40 MG tablet Take 1 tablet (40 mg total) by mouth daily.   . phenylephrine (SUDAFED PE) 10 MG TABS tablet Take 10 mg by mouth every 4 (four) hours as needed.   . pimecrolimus (ELIDEL) 1 % cream Apply topically 2 (two) times daily.   [name]  Polyethylene Glycol 3350 (MIRALAX PO) Take by mouth as needed.   . Prasterone, DHEA, 25 MG CAPS Take 2 mg by mouth daily.   . predniSONE (DELTASONE) 10 MG tablet Take one tablet with food twice daily for five days.   . Probiotic Product (PROBIOTIC PO) Take by mouth daily.   . Simethicone (GAS-X PO) Take by mouth.   . tizanidine (ZANAFLEX) 2 MG capsule Take 1-2 capsules (2-4 mg total) by mouth 3 (three) times daily as needed for muscle spasms.   . traMADol (ULTRAM) 50 MG tablet Take 1 tablet (50 mg total) by mouth every 12 (twelve) hours as needed.   . triamcinolone cream (KENALOG) 0.1 % Apply 1 application topically 2 (two) times daily.   [name] UNABLE  TO FIND Take 1 capsule by mouth daily. Dv3- vitamin D 3 plus immune support    No facility-administered encounter medications on file as of 08/05/2019.    Goals        . Client said she wants to talk with someone about her health issues and how she deals with them (pt-stated)     Current Barriers:  . Limited access to food . Mental Health Concerns  in patient with chronic diagnoses of IBS, Hyperlipidemia, GAD, GERD and Osteoporosis . Pain issues  Clinical Social Work Clinical Goal(s):  [name] Over the next 30 day days, client will work with LCSW to address concerns related to health issues of client and how she deals with health issues  Interventions:  Encouraged client to talk with RN CM about nursing needs of client  Talked with client about pain issues of client  Talked with Treniti about her social support network (spouse)  Talked with client about relaxation techniques of choice (reading, listening to music, watching TV)  Talked with clientabout transport needs of client  Talked with clientabout fatigue issues of client  Talked with client about sleeping issues  Talked with client about her recent medical appointment related to her allergy symptoms  Talked with client about her upcoming medical appointments  Talked with client about her mood (she said she thought her mood was stable; she said she does not take Ativan each day but only takes Ativan when needed)  Talked with client about support through CCM program  Patient Self Care Activities:  . Attends all scheduled provider appointments  . Self administers prescribed medications . Calls provider office for new concerns or questions  Plan: Patient to contact LCSW to discuss psychosocial needs of cllient LCSW will call client in 4 weeks to discuss health needs of client  and client management of health needs Client to attend scheduled medical appointments Client to communicate with RN CM to discuss nursing needs of  client  *Initial goal documentation    Follow Up Plan: LCSW to call client in next 4 weeks to talk with client about health needs of client and about client management of health needs faced  [name].Tami Barren MSW, LCSW Licensed Clinical Social Worker [facility] [phone]

## 2019-08-06 NOTE — Telephone Encounter (Signed)
Called and left a message for patient to call the office in regards to this matter. 

## 2019-08-08 NOTE — Telephone Encounter (Signed)
Yes mam what would the diagnosis be for?

## 2019-08-08 NOTE — Telephone Encounter (Signed)
Allergic rhinitis without resolution despite aggressive treatment. Thank you

## 2019-08-08 NOTE — Telephone Encounter (Signed)
Can we please send her to ENT for evaluation and treatment? Thank you

## 2019-08-08 NOTE — Addendum Note (Signed)
Addended by: Chip Boer R on: 08/08/2019 09:24 AM   Modules accepted: Orders

## 2019-08-08 NOTE — Telephone Encounter (Signed)
Called and spoke with the patient and she stated that she is still having issues. She is still coughing and having nasal congestion. She states she is still taking her antihistamines, nasal rinses and sprays, and her antibiotics.

## 2019-08-08 NOTE — Telephone Encounter (Signed)
An order has been placed for ENT referral to Dr. Lucia Gaskins for Allergic rhinitis without resolution despite aggressive treatment. Called patient and advised. Patient verbalized understanding.

## 2019-08-09 ENCOUNTER — Other Ambulatory Visit: Payer: Self-pay | Admitting: Family

## 2019-08-12 DIAGNOSIS — N3010 Interstitial cystitis (chronic) without hematuria: Secondary | ICD-10-CM | POA: Diagnosis not present

## 2019-08-12 DIAGNOSIS — N3020 Other chronic cystitis without hematuria: Secondary | ICD-10-CM | POA: Diagnosis not present

## 2019-08-13 NOTE — Telephone Encounter (Signed)
Referral has been placed to Dr Lucia Gaskins. I will keep an eye on this referral for scheduling.

## 2019-08-25 ENCOUNTER — Telehealth (INDEPENDENT_AMBULATORY_CARE_PROVIDER_SITE_OTHER): Payer: Medicare HMO | Admitting: Family Medicine

## 2019-08-25 DIAGNOSIS — R3989 Other symptoms and signs involving the genitourinary system: Secondary | ICD-10-CM | POA: Diagnosis not present

## 2019-08-25 MED ORDER — FOSFOMYCIN TROMETHAMINE 3 G PO PACK: 3.0000 g | PACK | Freq: Once | ORAL | 0 refills | Status: DC

## 2019-08-25 MED ORDER — FLUCONAZOLE 150 MG PO TABS
150.0000 mg | ORAL_TABLET | Freq: Once | ORAL | 0 refills | Status: AC
Start: 2019-08-25 — End: 2019-08-25

## 2019-08-25 NOTE — Telephone Encounter (Signed)
Telephone visit  Subjective: CC: UTI PCP: [name], DO [id number] Kristy Garza is a 69 y.o. female calls for telephone consult today. Patient provides verbal consent for consult held via phone.  Due to COVID-19 pandemic this visit was conducted virtually. This visit type was conducted due to national recommendations for restrictions regarding the COVID-19 Pandemic (e.g. social distancing, sheltering in place) in an effort to limit this patient's exposure and mitigate transmission in our community. All issues noted in this document were discussed and addressed.  A physical exam was not performed with this format.   [place] from home Others present for call: none  1. Urinary symptoms Patient reports that after speaking to me on Friday, she defecated in her sleep after drinking a bunch of Miralax for bloating. She reports a 1 day h/o dysuria.  Denies urinary frequency, urgency, hematuria, fevers, chills, abdominal pain, nausea, vomiting, back pain, vaginal discharge.   ROS: Per HPI  Allergies  Allergen Reactions  . Cefuroxime Axetil Other (See Comments)    Extreme gas  . Gabapentin Other (See Comments)    Constipation Constipation  . Montelukast Other (See Comments)    Numbness and tingling in hand. Numbness and tingling in hand. Numbness and tingling in hand. NUMBNESS OF HANDS AND FEET. Numbness in hands and feet NUMBNESS OF HANDS AND FEET.  . Pregabalin Other (See Comments)    Drowsiness, dry mouth Drowsiness, dry mouth Makes her tired and thirsty  . Augmentin [Amoxicillin-Pot Clavulanate] Diarrhea  . Avelox [Moxifloxacin Hcl In Nacl] Other (See Comments)    Tremors   . Biaxin [Clarithromycin]     Unsure of reaction  . Cedax [Ceftibuten] Other (See Comments)    tremors  . Ciprofloxacin Other (See Comments) and Nausea And Vomiting    Blurred vision Blurred vision Pt can't remember reaction  .  Clindamycin/Lincomycin     tachycardia  . Levofloxacin Other (See Comments)    Feels dehydrated  . Lincomycin Other (See Comments)    tachycardia  . Montelukast Sodium Other (See Comments)    Numbness and tingling in hand. Numbness and tingling in hand.  . Sulfonamide Derivatives Other (See Comments)    Gi upset   Past Medical History:  Diagnosis Date  . Acid reflux   . Allergy   . Arthritis    ARTHRITIS IN NECK BY DR. Alroy Dust [facility]  . Asthma   . Interstitial cystitis   . Osteoporosis   . PONV (postoperative nausea and vomiting)   . Tinnitus   . Trigeminal neuralgia    Atypical trigeminal neuralgia    Current Outpatient Medications:  .  acetaminophen (TYLENOL) 500 MG tablet, Take by mouth., Disp: , Rfl:  .  albuterol (VENTOLIN HFA) 108 (90 Base) MCG/ACT inhaler, Inhale 2 puffs into the lungs every 4 (four) hours as needed for wheezing or shortness of breath., Disp: 18 g, Rfl: 1 .  azelastine (ASTELIN) 0.1 % nasal spray, Place 2 sprays into both nostrils 2 (two) times daily. Use in each nostril as directed, Disp: 30 mL, Rfl: 2 .  b complex vitamins capsule, Take 1 capsule by mouth daily., Disp: , Rfl:  .  calcium citrate-vitamin D (CITRACAL+D) 315-200 MG-UNIT tablet, Take 2 tablets by mouth 2 (two) times daily., Disp: , Rfl:  .  cetirizine HCl (ZYRTEC) 5 MG/5ML SOLN, Take 5 mg by mouth daily., Disp: , Rfl:  .  dextromethorphan-guaiFENesin (MUCINEX DM) 30-600 MG 12hr tablet, Take 1 tablet by  mouth 2 (two) times daily., Disp: , Rfl:  .  doxycycline (VIBRAMYCIN) 100 MG capsule, Take 1 capsule (100 mg total) by mouth 2 (two) times daily., Disp: 20 capsule, Rfl: 0 .  Ergocalciferol (VITAMIN D2) 2000 units TABS, Take 2,000 Units by mouth daily., Disp: , Rfl:  .  Fluticasone Furoate (ARNUITY ELLIPTA) 200 MCG/ACT AEPB, TAKE 1 PUFF BY MOUTH EVERY DAY, Disp: 30 each, Rfl: 5 .  Halcinonide 0.1 % CREA, Apply thin layer to skin twice daily as needed, Disp: 60 g, Rfl: 2 .  hydrOXYzine  (VISTARIL) 25 MG capsule, Take 1 tablet by mouth at bedtime as needed for itch, Disp: 30 capsule, Rfl: 3 .  hyoscyamine (LEVSIN SL) 0.125 MG SL tablet, PLEASE SEE ATTACHED FOR DETAILED DIRECTIONS, Disp: , Rfl:  .  ipratropium (ATROVENT) 0.06 % nasal spray, 2 sprays by Each Nare route Three (3) times a day., Disp: , Rfl:  .  lidocaine (XYLOCAINE) 2 % jelly, APPLY TOPICALLY TO AFFECTED AREA EVERY DAY AS NEEDED (use sparingly), Disp: 85 g, Rfl: 0 .  LORazepam (ATIVAN) 0.5 MG tablet, Take 1 tablet (0.5 mg total) by mouth 2 (two) times daily as needed for anxiety., Disp: 30 tablet, Rfl: 0 .  mometasone (NASONEX) 50 MCG/ACT nasal spray, PLACE 2 SPRAYS INTO THE NOSE DAILY., Disp: 17 g, Rfl: 5 .  pantoprazole (PROTONIX) 40 MG tablet, Take 1 tablet (40 mg total) by mouth daily., Disp: 30 tablet, Rfl: 5 .  phenylephrine (SUDAFED PE) 10 MG TABS tablet, Take 10 mg by mouth every 4 (four) hours as needed., Disp: , Rfl:  .  pimecrolimus (ELIDEL) 1 % cream, Apply topically 2 (two) times daily., Disp: 30 g, Rfl: 2 .  Polyethylene Glycol 3350 (MIRALAX PO), Take by mouth as needed., Disp: , Rfl:  .  Prasterone, DHEA, 25 MG CAPS, Take 2 mg by mouth daily., Disp: , Rfl:  .  predniSONE (DELTASONE) 10 MG tablet, Take one tablet with food twice daily for five days., Disp: 10 tablet, Rfl: 0 .  Probiotic Product (PROBIOTIC PO), Take by mouth daily., Disp: , Rfl:  .  Simethicone (GAS-X PO), Take by mouth., Disp: , Rfl:  .  tizanidine (ZANAFLEX) 2 MG capsule, Take 1-2 capsules (2-4 mg total) by mouth 3 (three) times daily as needed for muscle spasms., Disp: 60 capsule, Rfl: 2 .  traMADol (ULTRAM) 50 MG tablet, Take 1 tablet (50 mg total) by mouth every 12 (twelve) hours as needed., Disp: 30 tablet, Rfl: 1 .  triamcinolone cream (KENALOG) 0.1 %, Apply 1 application topically 2 (two) times daily., Disp: 80 g, Rfl: 1 .  UNABLE TO FIND, Take 1 capsule by mouth daily. Dv3- vitamin D 3 plus immune support, Disp: , Rfl:    Assessment/ Plan: 69 y.o. female   1. Suspected UTI Empiric treatment with Monurol.  We discussed red flags and reasons for in person evaluation at urgent care if symptoms do not improve.  Push water.  Follow up prn. - fosfomycin (MONUROL) 3 g PACK; Take 3 g by mouth once for 1 dose.  Dispense: 3 g; Refill: 0  Meds ordered this encounter  Medications  . fosfomycin (MONUROL) 3 g PACK    Sig: Take 3 g by mouth once for 1 dose.    Dispense:  3 g    Refill:  0  . fluconazole (DIFLUCAN) 150 MG tablet    Sig: Take 1 tablet (150 mg total) by mouth once for 1 dose.    Dispense:  1 tablet    Refill:  0     Start time: 12:30pm End time: 12:36pm  Total time spent on patient care (including telephone call/ virtual visit): 12 minutes  [place], [place] [phone]

## 2019-09-03 ENCOUNTER — Ambulatory Visit (INDEPENDENT_AMBULATORY_CARE_PROVIDER_SITE_OTHER): Payer: Medicare HMO | Admitting: Otolaryngology

## 2019-09-06 DIAGNOSIS — N959 Unspecified menopausal and perimenopausal disorder: Secondary | ICD-10-CM | POA: Diagnosis not present

## 2019-09-06 DIAGNOSIS — R5381 Other malaise: Secondary | ICD-10-CM | POA: Diagnosis not present

## 2019-09-10 ENCOUNTER — Ambulatory Visit: Payer: Self-pay | Admitting: Licensed Clinical Social Worker

## 2019-09-10 DIAGNOSIS — K581 Irritable bowel syndrome with constipation: Secondary | ICD-10-CM

## 2019-09-10 DIAGNOSIS — M810 Age-related osteoporosis without current pathological fracture: Secondary | ICD-10-CM

## 2019-09-10 DIAGNOSIS — F411 Generalized anxiety disorder: Secondary | ICD-10-CM

## 2019-09-10 DIAGNOSIS — K219 Gastro-esophageal reflux disease without esophagitis: Secondary | ICD-10-CM

## 2019-09-10 DIAGNOSIS — E782 Mixed hyperlipidemia: Secondary | ICD-10-CM

## 2019-09-10 NOTE — Patient Instructions (Addendum)
Licensed Clinical Social Worker Visit Information  Materials Provided: No  09/10/2019  Name: Kristy Garza         MRN: [id number]       DOB: [birth year]  Kristy Garza is a 69 y.o. year old female who is a primary care patient of Kristy Norlander, DO. The CCM team was consulted for assistance with [employer] .   Review of patient status, including review of consultants reports, other relevant assessments, and collaboration with appropriate care team members and the patient's provider was performed as part of comprehensive patient evaluation and provision of chronic care management services.    SDOH (Social Determinants of Health) assessments performed: No;risk for depression; risk for tobacco use; risk of financial strain  LCSW called client phone numbers several times today but LCSW was not able to reach client via phone today. However, LCSW did leave phone message requesting client please return call to LCSW at [phone]  Follow Up Plan: LCSW to call client in next 4 weeks to talk with client about health needs of client and about management of client's health needs  LCSW was not able to speak via phone with client today; thus the patient was not able to verbalize understanding of instructions provided today and was not able to accept or decline a print copy of patient instruction materials.   Kristy Garza.Kristy Garza MSW, LCSW Licensed Clinical Social Worker [facility] [phone]

## 2019-09-10 NOTE — Chronic Care Management (AMB) (Signed)
Chronic Care Management    Clinical Social Work Follow Up Note  09/10/2019 Name: Kristy Garza MRN: [id number] DOB: 04-10-1950  Kristy Garza is a 69 y.o. year old female who is a primary care patient of [name], DO. The CCM team was consulted for assistance with [employer] .   Review of patient status, including review of consultants reports, other relevant assessments, and collaboration with appropriate care team members and the patient's provider was performed as part of comprehensive patient evaluation and provision of chronic care management services.    SDOH (Social Determinants of Health) assessments performed: No;risk for depression; risk for tobacco use; risk of financial strain    Office Visit from 08/28/2018 in [place]  PHQ-9 Total Score 0      GAD 7 : Generalized Anxiety Score 08/28/2018 05/23/2018 04/11/2018  Nervous, Anxious, on Edge 0 3 1  Control/stop worrying 0 2 0  Worry too much - different things 0 2 0  Trouble relaxing 0 2 0  Restless 0 1 0  Easily annoyed or irritable 0 1 0  Afraid - awful might happen 0 1 0  Total GAD 7 Score 0 12 1    Outpatient Encounter Medications as of 09/10/2019  Medication Sig Note  . acetaminophen (TYLENOL) 500 MG tablet Take by mouth. 01/07/2016: Received from: [facility] Received Sig: Take by mouth.  [name] albuterol (VENTOLIN HFA) 108 (90 Base) MCG/ACT inhaler Inhale 2 puffs into the lungs every 4 (four) hours as needed for wheezing or shortness of breath.   [name] azelastine (ASTELIN) 0.1 % nasal spray Place 2 sprays into both nostrils 2 (two) times daily. Use in each nostril as directed   . b complex vitamins capsule Take 1 capsule by mouth daily.   . calcium citrate-vitamin D (CITRACAL+D) 315-200 MG-UNIT tablet Take 2 tablets by mouth 2 (two) times daily.   . cetirizine HCl (ZYRTEC) 5 MG/5ML SOLN Take 5 mg by mouth daily.   [name] dextromethorphan-guaiFENesin (MUCINEX DM)  30-600 MG 12hr tablet Take 1 tablet by mouth 2 (two) times daily.   [name] doxycycline (VIBRAMYCIN) 100 MG capsule Take 1 capsule (100 mg total) by mouth 2 (two) times daily.   . Ergocalciferol (VITAMIN D2) 2000 units TABS Take 2,000 Units by mouth daily.   . Fluticasone Furoate (ARNUITY ELLIPTA) 200 MCG/ACT AEPB TAKE 1 PUFF BY MOUTH EVERY DAY   . Halcinonide 0.1 % CREA Apply thin layer to skin twice daily as needed   . hydrOXYzine (VISTARIL) 25 MG capsule Take 1 tablet by mouth at bedtime as needed for itch   . hyoscyamine (LEVSIN SL) 0.125 MG SL tablet PLEASE SEE ATTACHED FOR DETAILED DIRECTIONS   . ipratropium (ATROVENT) 0.06 % nasal spray 2 sprays by Each Nare route Three (3) times a day.   . lidocaine (XYLOCAINE) 2 % jelly APPLY TOPICALLY TO AFFECTED AREA EVERY DAY AS NEEDED (use sparingly)   . LORazepam (ATIVAN) 0.5 MG tablet Take 1 tablet (0.5 mg total) by mouth 2 (two) times daily as needed for anxiety.   . mometasone (NASONEX) 50 MCG/ACT nasal spray PLACE 2 SPRAYS INTO THE NOSE DAILY.   . pantoprazole (PROTONIX) 40 MG tablet Take 1 tablet (40 mg total) by mouth daily.   . phenylephrine (SUDAFED PE) 10 MG TABS tablet Take 10 mg by mouth every 4 (four) hours as needed.   . pimecrolimus (ELIDEL) 1 % cream Apply topically 2 (two) times daily.   [name]  Polyethylene Glycol 3350 (MIRALAX PO) Take by mouth as needed.   . Prasterone, DHEA, 25 MG CAPS Take 2 mg by mouth daily.   . predniSONE (DELTASONE) 10 MG tablet Take one tablet with food twice daily for five days.   . Probiotic Product (PROBIOTIC PO) Take by mouth daily.   . Simethicone (GAS-X PO) Take by mouth.   . tizanidine (ZANAFLEX) 2 MG capsule Take 1-2 capsules (2-4 mg total) by mouth 3 (three) times daily as needed for muscle spasms.   . traMADol (ULTRAM) 50 MG tablet Take 1 tablet (50 mg total) by mouth every 12 (twelve) hours as needed.   . triamcinolone cream (KENALOG) 0.1 % Apply 1 application topically 2 (two) times daily.   [name] UNABLE TO  FIND Take 1 capsule by mouth daily. Dv3- vitamin D 3 plus immune support    No facility-administered encounter medications on file as of 09/10/2019.   LCSW called client phone numbers several times today but LCSW was not able to reach client via phone today. However, LCSW did leave phone message requesting client please return call to LCSW at [phone]  Follow Up Plan: LCSW to call client in next 4 weeks to talk with client about health needs of client and about management of client's health needs  [name].Jovahn Breit MSW, LCSW Licensed Clinical Social Worker [facility] [phone]

## 2019-09-11 ENCOUNTER — Ambulatory Visit (INDEPENDENT_AMBULATORY_CARE_PROVIDER_SITE_OTHER): Payer: Medicare HMO | Admitting: Otolaryngology

## 2019-09-11 ENCOUNTER — Ambulatory Visit: Payer: Medicare HMO | Admitting: Physician Assistant

## 2019-09-13 ENCOUNTER — Ambulatory Visit (INDEPENDENT_AMBULATORY_CARE_PROVIDER_SITE_OTHER): Payer: Medicare HMO | Admitting: *Deleted

## 2019-09-13 DIAGNOSIS — Z0000 Encounter for general adult medical examination without abnormal findings: Secondary | ICD-10-CM | POA: Diagnosis not present

## 2019-09-13 NOTE — Progress Notes (Signed)
MEDICARE ANNUAL WELLNESS VISIT  09/13/2019  Telephone Visit Disclaimer This Medicare AWV was conducted by telephone due to national recommendations for restrictions regarding the COVID-19 Pandemic (e.g. social distancing).  I verified, using two identifiers, that I am speaking with [name] or their authorized healthcare agent. I discussed the limitations, risks, security, and privacy concerns of performing an evaluation and management service by telephone and the potential availability of an in-person appointment in the future. The patient expressed understanding and agreed to proceed.   Subjective:  Kristy Garza is a 69 y.o. female patient of [name], DO who had a Medicare Annual Wellness Visit today via telephone. Kristy Garza is Retired and lives with their spouse. she has 3 children. she reports that she is socially active and does interact with friends/family regularly. she is moderately physically active and enjoys spending time with her grand kids, cooking and reading .  Patient Care Team: [name], DO as PCP - General (Family Medicine) Deneise Lever, MD as Consulting Physician (Pulmonary Disease) Minus Breeding, MD as Consulting Physician (Cardiology) Shea Evans, [name], LCSW as Social Worker (Licensed Clinical Social Worker) Ilean China, RN as Case Manager  Advanced Directives 09/13/2019 09/10/2018 09/07/2017 08/29/2017 07/28/2015 02/24/2015 07/11/2014  Does Patient Have a Medical Advance Directive? No No No No No No No  Would patient like information on creating a medical advance directive? No - Patient declined No - Patient declined Yes (MAU/Ambulatory/Procedural Areas - Information given) - No - patient declined information Yes - Scientist, clinical (histocompatibility and immunogenetics) given Yes - Scientist, clinical (histocompatibility and immunogenetics) given  Pre-existing out of facility DNR order (yellow form or pink MOST form) - - - - - - -    Hospital Utilization Over the Past 12 Months: # of hospitalizations  or ER visits: 0 # of surgeries: 0  Review of Systems    Patient reports that her overall health is worse compared to last year.  History obtained from chart review  Patient Reported Readings (BP, Pulse, CBG, Weight, etc) none  Pain Assessment Pain : No/denies pain     Current Medications & Allergies (verified) Allergies as of 09/13/2019      Reactions   Cefuroxime Axetil Other (See Comments)   Extreme gas   Gabapentin Other (See Comments)   Constipation Constipation   Montelukast Other (See Comments)   Numbness and tingling in hand. Numbness and tingling in hand. Numbness and tingling in hand. NUMBNESS OF HANDS AND FEET. Numbness in hands and feet NUMBNESS OF HANDS AND FEET.   Pregabalin Other (See Comments)   Drowsiness, dry mouth Drowsiness, dry mouth Makes her tired and thirsty   Augmentin [amoxicillin-pot Clavulanate] Diarrhea   Avelox [moxifloxacin Hcl In Nacl] Other (See Comments)   Tremors   Biaxin [clarithromycin]    Unsure of reaction   Cedax [ceftibuten] Other (See Comments)   tremors   Ciprofloxacin Other (See Comments), Nausea And Vomiting   Blurred vision Blurred vision Pt can't remember reaction   Clindamycin/lincomycin    tachycardia   Levofloxacin Other (See Comments)   Feels dehydrated   Lincomycin Other (See Comments)   tachycardia   Montelukast Sodium Other (See Comments)   Numbness and tingling in hand. Numbness and tingling in hand.   Sulfonamide Derivatives Other (See Comments)   Gi upset      Medication List       Accurate as of September 13, 2019 11:12 AM. If you have any questions, ask your nurse or doctor.  STOP taking these medications   cetirizine HCl 5 MG/5ML Soln Commonly known as: Zyrtec   doxycycline 100 MG capsule Commonly known as: VIBRAMYCIN   ipratropium 0.06 % nasal spray Commonly known as: ATROVENT   pimecrolimus 1 % cream Commonly known as: ELIDEL   predniSONE 10 MG tablet Commonly known as:  DELTASONE   triamcinolone cream 0.1 % Commonly known as: KENALOG   Vitamin D2 50 MCG (2000 UT) Tabs     TAKE these medications   acetaminophen 500 MG tablet Commonly known as: TYLENOL Take by mouth.   albuterol 108 (90 Base) MCG/ACT inhaler Commonly known as: VENTOLIN HFA Inhale 2 puffs into the lungs every 4 (four) hours as needed for wheezing or shortness of breath.   Arnuity Ellipta 200 MCG/ACT Aepb Generic drug: Fluticasone Furoate TAKE 1 PUFF BY MOUTH EVERY DAY   azelastine 0.1 % nasal spray Commonly known as: ASTELIN Place 2 sprays into both nostrils 2 (two) times daily. Use in each nostril as directed   b complex vitamins capsule Take 1 capsule by mouth daily.   calcium citrate-vitamin D 315-200 MG-UNIT tablet Commonly known as: CITRACAL+D Take 2 tablets by mouth 2 (two) times daily.   dextromethorphan-guaiFENesin 30-600 MG 12hr tablet Commonly known as: MUCINEX DM Take 1 tablet by mouth 2 (two) times daily.   GAS-X PO Take by mouth.   Halcinonide 0.1 % Crea Apply thin layer to skin twice daily as needed   hydrOXYzine 25 MG capsule Commonly known as: VISTARIL Take 1 tablet by mouth at bedtime as needed for itch   hyoscyamine 0.125 MG SL tablet Commonly known as: LEVSIN SL PLEASE SEE ATTACHED FOR DETAILED DIRECTIONS   lidocaine 2 % jelly Commonly known as: XYLOCAINE APPLY TOPICALLY TO AFFECTED AREA EVERY DAY AS NEEDED (use sparingly)   LORazepam 0.5 MG tablet Commonly known as: ATIVAN Take 1 tablet (0.5 mg total) by mouth 2 (two) times daily as needed for anxiety.   MIRALAX PO Take by mouth as needed.   mometasone 50 MCG/ACT nasal spray Commonly known as: NASONEX PLACE 2 SPRAYS INTO THE NOSE DAILY.   pantoprazole 40 MG tablet Commonly known as: PROTONIX Take 1 tablet (40 mg total) by mouth daily.   phenylephrine 10 MG Tabs tablet Commonly known as: SUDAFED PE Take 10 mg by mouth every 4 (four) hours as needed.   Prasterone (DHEA) 25 MG  Caps Take 2 mg by mouth daily.   PROBIOTIC PO Take by mouth daily.   tizanidine 2 MG capsule Commonly known as: ZANAFLEX Take 1-2 capsules (2-4 mg total) by mouth 3 (three) times daily as needed for muscle spasms.   traMADol 50 MG tablet Commonly known as: ULTRAM Take 1 tablet (50 mg total) by mouth every 12 (twelve) hours as needed.   UNABLE TO FIND Take 1 capsule by mouth daily. Dv3- vitamin D 3 plus immune support       History (reviewed): Past Medical History:  Diagnosis Date  . Acid reflux   . Allergy   . Arthritis    ARTHRITIS IN NECK BY DR. Alroy Dust [facility]  . Asthma   . Interstitial cystitis   . Osteoporosis   . PONV (postoperative nausea and vomiting)   . Tinnitus   . Trigeminal neuralgia    Atypical trigeminal neuralgia   Past Surgical History:  Procedure Laterality Date  . ABDOMINAL HYSTERECTOMY  2000  . CYSTOSCOPY WITH HYDRODISTENSION AND BIOPSY N/A 06/11/2012   Procedure: CYSTOSCOPY/BIOPSY/HYDRODISTENSION with Instillation of Pyridium and Marcaine and  Kenalog;  Surgeon: Ailene Rud, MD;  Location: [facility];  Service: Urology;  Laterality: N/A;  1 hour requested for this case  BLADDER BIOPSY  . ETHMOIDECTOMY  2012  . SEPTOPLASTY  1980's  . vocal cord surgery   1990's   polyp removal   Family History  Problem Relation Age of Onset  . Hyperlipidemia Mother   . Arthritis Mother   . Cancer Mother   . Lymphoma Mother   . Cancer Father   . Allergies Father   . Allergies Sister   . Allergies Sister   . Allergic rhinitis Neg Hx   . Angioedema Neg Hx   . Asthma Neg Hx   . Eczema Neg Hx   . Immunodeficiency Neg Hx   . Urticaria Neg Hx    Social History   Socioeconomic History  . Marital status: Married    Spouse name: [name]  . Number of children: 3  . Years of education: 83  . Highest education level: Some college, no degree  Occupational History  . Occupation: CNA    Comment: Retired  Tobacco Use  . Smoking  status: Never Smoker  . Smokeless tobacco: Never Used  Vaping Use  . Vaping Use: Never used  Substance and Sexual Activity  . Alcohol use: No    Alcohol/week: 0.0 standard drinks    Comment: 01-19-2016 per pt no  . Drug use: No    Comment: 01-19-2016 per pt no   . Sexual activity: Not Currently    Birth control/protection: Surgical  Other Topics Concern  . Not on file  Social History Narrative   Lives with husband.    Social Determinants of Health   Financial Resource Strain: Low Risk   . Difficulty of Paying Living Expenses: Not hard at all  Food Insecurity: No Food Insecurity  . Worried About Charity fundraiser in the Last Year: Never true  . Ran Out of Food in the Last Year: Never true  Transportation Needs: No Transportation Needs  . Lack of Transportation (Medical): No  . Lack of Transportation (Non-Medical): No  Physical Activity: Sufficiently Active  . Days of Exercise per Week: 5 days  . Minutes of Exercise per Session: 30 min  Stress: No Stress Concern Present  . Feeling of Stress : Only a little  Social Connections: Socially Integrated  . Frequency of Communication with Friends and Family: More than three times a week  . Frequency of Social Gatherings with Friends and Family: More than three times a week  . Attends Religious Services: More than 4 times per year  . Active Member of Clubs or Organizations: Yes  . Attends Archivist Meetings: More than 4 times per year  . Marital Status: Married    Activities of Daily Living In your present state of health, do you have any difficulty performing the following activities: 09/13/2019  Hearing? N  Vision? N  Difficulty concentrating or making decisions? N  Walking or climbing stairs? N  Dressing or bathing? N  Doing errands, shopping? N  Preparing Food and eating ? N  Using the Toilet? N  In the past six months, have you accidently leaked urine? N  Do you have problems with loss of bowel control? N    Managing your Medications? N  Managing your Finances? N  Housekeeping or managing your Housekeeping? N  Some recent data might be hidden    Patient Education/ Literacy How often do you need to have  someone help you when you read instructions, pamphlets, or other written materials from your doctor or pharmacy?: 1 - Never What is the last grade level you completed in school?: Some College-no degree  Exercise Current Exercise Habits: Home exercise routine, Type of exercise: walking, Time (Minutes): 30, Frequency (Times/Week): 5, Weekly Exercise (Minutes/Week): 150, Intensity: Mild, Exercise limited by: respiratory conditions(s)  Diet Patient reports consuming 3 meals a day and 2 snack(s) a day Patient reports that her primary diet is: Regular Patient reports that she does have regular access to food.   Depression Screen PHQ 2/9 Scores 09/13/2019 09/10/2018 08/28/2018 05/23/2018 04/13/2018 04/11/2018 02/15/2018  PHQ - 2 Score 0 0 0 0 2 0 0  PHQ- 9 Score - - 0 0 7 0 -     Fall Risk Fall Risk  09/13/2019 09/10/2018 08/28/2018 04/11/2018 02/15/2018  Falls in the past year? 0 0 0 0 0     Objective:  Kristy Garza seemed alert and oriented and she participated appropriately during our telephone visit.  Blood Pressure Weight BMI  BP Readings from Last 3 Encounters:  08/02/19 102/70  01/25/19 102/70  12/07/18 112/80   Wt Readings from Last 3 Encounters:  08/02/19 110 lb 9.6 oz (50.2 kg)  01/25/19 108 lb 3.2 oz (49.1 kg)  08/28/18 107 lb (48.5 kg)   BMI Readings from Last 1 Encounters:  08/02/19 16.82 kg/m    *Unable to obtain current vital signs, weight, and BMI due to telephone visit type  Hearing/Vision  . Katasha did not seem to have difficulty with hearing/understanding during the telephone conversation . Reports that she has had a formal eye exam by an eye care professional within the past year . Reports that she has not had a formal hearing evaluation within the past year *Unable  to fully assess hearing and vision during telephone visit type  Cognitive Function: 6CIT Screen 09/13/2019 09/10/2018  What Year? 0 points 0 points  What month? 0 points 0 points  What time? 0 points 0 points  Count back from 20 0 points 0 points  Months in reverse 0 points 0 points  Repeat phrase 0 points 0 points  Total Score 0 0   (Normal:0-7, Significant for Dysfunction: >8)  Normal Cognitive Function Screening: Yes   Immunization & Health Maintenance Record Immunization History  Administered Date(s) Administered  . Influenza,inj,Quad PF,6+ Mos 12/28/2012, 12/25/2013, 12/26/2014  . Pneumococcal Conjugate-13 02/07/2013  . Pneumococcal Polysaccharide-23 09/07/2017  . Tdap 05/21/2010  . Zoster 03/27/2012    Health Maintenance  Topic Date Due  . COVID-19 Vaccine (1) Never done  . MAMMOGRAM  12/21/2018  . INFLUENZA VACCINE  10/27/2019  . TETANUS/TDAP  05/21/2020  . DEXA SCAN  11/05/2020  . COLONOSCOPY  06/28/2025  . Hepatitis C Screening  Completed  . PNA vac Low Risk Adult  Completed       Assessment  This is a routine wellness examination for Kristy Garza.  Health Maintenance: Due or Overdue Health Maintenance Due  Topic Date Due  . COVID-19 Vaccine (1) Never done  . MAMMOGRAM  12/21/2018    [name] does not need a referral for Community Assistance: Care Management:   Enrolled with Chong Sicilian, RN Social Work:    Enrolled with [facility] Prescription Assistance:  no Nutrition/Diabetes Education:  no   Plan:  Personalized Goals Goals Addressed            This Visit's Progress   . DIET - INCREASE WATER  INTAKE       Try to drink 6-8 glasses of water daily.      Personalized Health Maintenance & Screening Recommendations  Screening mammography Shingrix vaccine  COVID vaccine  Lung Cancer Screening Recommended: no (Low Dose CT Chest recommended if Age 93-80 years, 30 pack-year currently smoking OR have quit w/in past 15  years) Hepatitis C Screening recommended: no HIV Screening recommended: no  Advanced Directives: Written information was not prepared per patient's request.  Referrals & Orders No orders of the defined types were placed in this encounter.   Follow-up Plan . Follow-up with [name], DO as planned . Schedule your Screening Mammogram as discussed . Consider Shingrix vaccine at your next visit with your PCP . Consider COVID vaccine at your local pharmacy   I have personally reviewed and noted the following in the patient's chart:   . Medical and social history . Use of alcohol, tobacco or illicit drugs  . Current medications and supplements . Functional ability and status . Nutritional status . Physical activity . Advanced directives . List of other physicians . Hospitalizations, surgeries, and ER visits in previous 12 months . Vitals . Screenings to include cognitive, depression, and falls . Referrals and appointments  In addition, I have reviewed and discussed with [name] certain preventive protocols, quality metrics, and best practice recommendations. A written personalized care plan for preventive services as well as general preventive health recommendations is available and can be mailed to the patient at her request.      Milas Hock, LPN  [id number]

## 2019-09-20 ENCOUNTER — Ambulatory Visit (INDEPENDENT_AMBULATORY_CARE_PROVIDER_SITE_OTHER): Payer: Medicare HMO | Admitting: *Deleted

## 2019-09-20 DIAGNOSIS — J4530 Mild persistent asthma, uncomplicated: Secondary | ICD-10-CM

## 2019-09-20 DIAGNOSIS — M810 Age-related osteoporosis without current pathological fracture: Secondary | ICD-10-CM

## 2019-09-20 NOTE — Chronic Care Management (AMB) (Signed)
Chronic Care Management   Follow Up Note   09/20/2019 Name: Kristy Garza MRN: [id number] DOB: 10/05/1950  Referred by: [name], DO Reason for referral : Chronic Care Management (Incoming patient call)   Kristy Garza is a 69 y.o. year old female who is a primary care patient of [name], DO. The CCM team was consulted for assistance with chronic disease management and care coordination needs.    Review of patient status, including review of consultants reports, relevant laboratory and other test results, and collaboration with appropriate care team members and the patient's provider was performed as part of comprehensive patient evaluation and provision of chronic care management services.    I spoke with Kristy Garza by telephone today regarding management of her chronic medical conditions.   SDOH (Social Determinants of Health) assessments performed: No See Care Plan activities for detailed interventions related to [facility])     Outpatient Encounter Medications as of 09/20/2019  Medication Sig Note  . acetaminophen (TYLENOL) 500 MG tablet Take by mouth. 01/07/2016: Received from: [facility] Received Sig: Take by mouth.  [name] albuterol (VENTOLIN HFA) 108 (90 Base) MCG/ACT inhaler Inhale 2 puffs into the lungs every 4 (four) hours as needed for wheezing or shortness of breath.   [name] azelastine (ASTELIN) 0.1 % nasal spray Place 2 sprays into both nostrils 2 (two) times daily. Use in each nostril as directed   . b complex vitamins capsule Take 1 capsule by mouth daily.   . calcium citrate-vitamin D (CITRACAL+D) 315-200 MG-UNIT tablet Take 2 tablets by mouth 2 (two) times daily.   [name] dextromethorphan-guaiFENesin (MUCINEX DM) 30-600 MG 12hr tablet Take 1 tablet by mouth 2 (two) times daily.   . Fluticasone Furoate (ARNUITY ELLIPTA) 200 MCG/ACT AEPB TAKE 1 PUFF BY MOUTH EVERY DAY   . Halcinonide 0.1 % CREA Apply thin layer to skin twice daily as needed     . hydrOXYzine (VISTARIL) 25 MG capsule Take 1 tablet by mouth at bedtime as needed for itch   . hyoscyamine (LEVSIN SL) 0.125 MG SL tablet PLEASE SEE ATTACHED FOR DETAILED DIRECTIONS   . lidocaine (XYLOCAINE) 2 % jelly APPLY TOPICALLY TO AFFECTED AREA EVERY DAY AS NEEDED (use sparingly)   . LORazepam (ATIVAN) 0.5 MG tablet Take 1 tablet (0.5 mg total) by mouth 2 (two) times daily as needed for anxiety. (Patient not taking: Reported on 09/13/2019)   . mometasone (NASONEX) 50 MCG/ACT nasal spray PLACE 2 SPRAYS INTO THE NOSE DAILY.   . pantoprazole (PROTONIX) 40 MG tablet Take 1 tablet (40 mg total) by mouth daily.   . phenylephrine (SUDAFED PE) 10 MG TABS tablet Take 10 mg by mouth every 4 (four) hours as needed.   . Polyethylene Glycol 3350 (MIRALAX PO) Take by mouth as needed.   . Prasterone, DHEA, 25 MG CAPS Take 2 mg by mouth daily.   . Probiotic Product (PROBIOTIC PO) Take by mouth daily.   . Simethicone (GAS-X PO) Take by mouth.   . tizanidine (ZANAFLEX) 2 MG capsule Take 1-2 capsules (2-4 mg total) by mouth 3 (three) times daily as needed for muscle spasms.   . traMADol (ULTRAM) 50 MG tablet Take 1 tablet (50 mg total) by mouth every 12 (twelve) hours as needed.   [name] UNABLE TO FIND Take 1 capsule by mouth daily. Dv3- vitamin D 3 plus immune support    No facility-administered encounter medications on file as of 09/20/2019.  RN Care Plan   .  "I want the yeast to clear up" (pt-stated)        CARE PLAN ENTRY (see longitudinal plan of care for additional care plan information)  Current Barriers:  . Frequent antibiotic use . Care coordination needs related to yeast overgrowth in patient with recent/frequent antibiotic use and a hx of asthma  Nurse Case Manager Clinical Goal(s):  [name] Over the next 30 days, patient will continue to work with GI and integrative medical providers to improve yeast overgrowth and thrush.  Interventions:  . Inter-disciplinary care team collaboration (see  longitudinal plan of care) . Chart reviewed including recent office notes and lab results . Discussed recent antibiotic use for various infections . Discussed visit with integrative medicine specialist who prescribed fluconazole 1/2 tablet Mon, Wed, Fri x 2 weeks . Discussed visit with GI who prescribed Nystatin oral liquid o Discussed that directions were unclear as to whether she should swish and spit or swish and swallow . Advised patient to call GI doctor to clarify directions especially since she is also on another antifungal. Advised her to make him aware of the fluconazole Rx. . Encouraged patient to follow up with both of these providers as planned and to reach out to them sooner with any new or worsening symptoms  Patient Self Care Activities:  . Performs ADL's independently . Performs IADL's independently  Initial goal documentation     .  "I want to manage my osteoporosis" (pt-stated)   Not on track     Current Barriers:  . Chronic Disease Management support and education needs related to osteoporosis  Nurse Case Manager Clinical Goal(s):  [name] Over the next 60 days, patient will verbalize basic understanding of osteoporosis disease process and self health management plan as evidenced by deciding on a treatment plan with her provider.  Interventions:  . Chart reviewed . Medications reviewed and discussed o Taking Vitamin D. Not consistent with calcium supplement. o No Rx for osteoporosis management . Discussed patient's plan for osteoporosis management.  o Dr Lynne Logan recommended Reclast infusions and patient is still considering this . Reviewed and discussed prior Dexa scan results  o Results have progressively worsened with each test o Most recent was -4.3 in June 2020 . Discussed importance of managing osteoporosis and that she is at significant risk for fracture . Discussed that yearly testing may be covered by Medicare because of progressively worsening T score . RNCM  will collaborate with PCP regarding order for bone density scan . Recommended patient talk with Kristy Garza, PharmD after dexa scan and to discuss Reclast and other options  Patient Self Care Activities:  . Patient verbalizes understanding of plan to follow-up with PCP re: bone density testing and osteoporosis management . Performs ADL's independently . Performs IADL's independently   Please see past updates related to this goal by clicking on the "Past Updates" button in the selected goal      .  COMPLETED: Allergy & Chronic Sinusitis Management (pt-stated)        "I would like to get better control of my allergies and prevent recurrent sinusitis."  Current Barriers:  . Chronic disease management and support related to allergies and chronic sinusitis  Nurse Case Manager Clinical Goal(s):  [name] Over the next 30 days, patient will see improvement in current sinusitis symptoms and improved management of allergies.   Interventions:  . Evaluation of current treatment plan related to allergies/sinusitis and patient's adherence to plan as established by provider. [name]  Reviewed medications with patient and discussed current use of Augmentin and Afrin prescribed by allergist. Reiterated to only use Afrin for 3 days as prescribed due to rebound congestion.  . Discussed plans with patient for ongoing care management follow up and provided patient with direct contact information for care management team  . Encouraged patient to f/u with allergist if symptoms worsen or fail to resolve  Patient Self Care Activities:  . Performs ADL's independently . Performs IADL's independently  Please see past updates related to this goal by clicking on the "Past Updates" button in the selected goal           Plan:   The care management team will reach out to the patient again over the next 60 days.    Chong Sicilian, BSN, RN-BC Embedded Chronic Care Manager [facility] / [place]  Management Direct Dial: [phone]

## 2019-09-20 NOTE — Patient Instructions (Signed)
Visit Information  Goals Addressed              This Visit's Progress     Patient Stated   .  "I want the yeast to clear up" (pt-stated)        CARE PLAN ENTRY (see longitudinal plan of care for additional care plan information)  Current Barriers:  . Frequent antibiotic use . Care coordination needs related to yeast overgrowth in patient with recent/frequent antibiotic use and a hx of asthma  Nurse Case Manager Clinical Goal(s):  [name] Over the next 30 days, patient will continue to work with GI and integrative medical providers to improve yeast overgrowth and thrush.  Interventions:  . Inter-disciplinary care team collaboration (see longitudinal plan of care) . Chart reviewed including recent office notes and lab results . Discussed recent antibiotic use for various infections . Discussed visit with integrative medicine specialist who prescribed fluconazole 1/2 tablet Mon, Wed, Fri x 2 weeks . Discussed visit with GI who prescribed Nystatin oral liquid o Discussed that directions were unclear as to whether she should swish and spit or swish and swallow . Advised patient to call GI doctor to clarify directions especially since she is also on another antifungal. Advised her to make him aware of the fluconazole Rx. . Encouraged patient to follow up with both of these providers as planned and to reach out to them sooner with any new or worsening symptoms  Patient Self Care Activities:  . Performs ADL's independently . Performs IADL's independently  Initial goal documentation     .  "I want to manage my osteoporosis" (pt-stated)   Not on track     Current Barriers:  . Chronic Disease Management support and education needs related to osteoporosis  Nurse Case Manager Clinical Goal(s):  [name] Over the next 60 days, patient will verbalize basic understanding of osteoporosis disease process and self health management plan as evidenced by deciding on a treatment plan with her  provider.  Interventions:  . Chart reviewed . Medications reviewed and discussed o Taking Vitamin D. Not consistent with calcium supplement. o No Rx for osteoporosis management . Discussed patient's plan for osteoporosis management.  o Dr Lynne Logan recommended Reclast infusions and patient is still considering this . Reviewed and discussed prior Dexa scan results  o Results have progressively worsened with each test o Most recent was -4.3 in June 2020 . Discussed importance of managing osteoporosis and that she is at significant risk for fracture . Discussed that yearly testing may be covered by Medicare because of progressively worsening T score . RNCM will collaborate with PCP regarding order for bone density scan . Recommended patient talk with Lottie Dawson, PharmD after dexa scan and to discuss Reclast and other options  Patient Self Care Activities:  . Patient verbalizes understanding of plan to follow-up with PCP re: bone density testing and osteoporosis management . Performs ADL's independently . Performs IADL's independently   Please see past updates related to this goal by clicking on the "Past Updates" button in the selected goal      .  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  Current Barriers:  . Non-adherence to prescribed medication regimen due to anxiety over potential addiction . Generalized anxiety disorder  Nurse Case Manager Clinical Goal(s):  [name] Over the next 60 days, patient will demonstrate  improved adherence to prescribed treatment plan for facial/jaw pain management as evidenced byself reported improvement in pain control. . Over the next 60 days, patient will keep appointments with integrative medicine specialist  Interventions:  . Chart review . Consulted by Theadore Nan, LCSW regarding chronic pain . Patient scheduled  for RN outreach to discuss  Patient Self Care Activities:  . Performs ADL's independently . Performs IADL's independently  Please see past updates related to this goal by clicking on the "Past Updates" button in the selected goal       .  COMPLETED: Allergy & Chronic Sinusitis Management (pt-stated)        "I would like to get better control of my allergies and prevent recurrent sinusitis."  Current Barriers:  . Chronic disease management and support related to allergies and chronic sinusitis  Nurse Case Manager Clinical Goal(s):  [name] Over the next 30 days, patient will see improvement in current sinusitis symptoms and improved management of allergies.   Interventions:  . Evaluation of current treatment plan related to allergies/sinusitis and patient's adherence to plan as established by provider. . Reviewed medications with patient and discussed current use of Augmentin and Afrin prescribed by allergist. Reiterated to only use Afrin for 3 days as prescribed due to rebound congestion.  . Discussed plans with patient for ongoing care management follow up and provided patient with direct contact information for care management team  . Encouraged patient to f/u with allergist if symptoms worsen or fail to resolve  Patient Self Care Activities:  . Performs ADL's independently . Performs IADL's independently  Please see past updates related to this goal by clicking on the "Past Updates" button in the selected goal          Patient verbalizes understanding of instructions provided today.   Follow-up Plan The care management team will reach out to the patient again over the next 60 days.   Chong Sicilian, BSN, RN-BC Embedded Chronic Care Manager [facility] / [facility] Direct Dial: [phone]

## 2019-09-24 ENCOUNTER — Ambulatory Visit: Payer: Medicare HMO | Admitting: *Deleted

## 2019-09-24 DIAGNOSIS — M810 Age-related osteoporosis without current pathological fracture: Secondary | ICD-10-CM

## 2019-09-24 DIAGNOSIS — J4530 Mild persistent asthma, uncomplicated: Secondary | ICD-10-CM | POA: Diagnosis not present

## 2019-09-24 NOTE — Chronic Care Management (AMB) (Signed)
Chronic Care Management   Follow Up Note   09/24/2019 Name: Kristy Garza MRN: [id number] DOB: Jan 08, 1951  Referred by: [name], DO Reason for referral : Chronic Care Management (RN follow up)   Kristy Garza is a 69 y.o. year old female who is a primary care patient of [name], DO. The CCM team was consulted for assistance with chronic disease management and care coordination needs.    Review of patient status, including review of consultants reports, relevant laboratory and other test results, and collaboration with appropriate care team members and the patient's provider was performed as part of comprehensive patient evaluation and provision of chronic care management services.    SDOH (Social Determinants of Health) assessments performed: No See Care Plan activities for detailed interventions related to [facility])     Outpatient Encounter Medications as of 09/24/2019  Medication Sig Note  . acetaminophen (TYLENOL) 500 MG tablet Take by mouth. 01/07/2016: Received from: [facility] Received Sig: Take by mouth.  [name] albuterol (VENTOLIN HFA) 108 (90 Base) MCG/ACT inhaler Inhale 2 puffs into the lungs every 4 (four) hours as needed for wheezing or shortness of breath.   [name] azelastine (ASTELIN) 0.1 % nasal spray Place 2 sprays into both nostrils 2 (two) times daily. Use in each nostril as directed   . b complex vitamins capsule Take 1 capsule by mouth daily.   . calcium citrate-vitamin D (CITRACAL+D) 315-200 MG-UNIT tablet Take 2 tablets by mouth 2 (two) times daily.   [name] dextromethorphan-guaiFENesin (MUCINEX DM) 30-600 MG 12hr tablet Take 1 tablet by mouth 2 (two) times daily.   . Fluticasone Furoate (ARNUITY ELLIPTA) 200 MCG/ACT AEPB TAKE 1 PUFF BY MOUTH EVERY DAY   . Halcinonide 0.1 % CREA Apply thin layer to skin twice daily as needed   . hydrOXYzine (VISTARIL) 25 MG capsule Take 1 tablet by mouth at bedtime as needed for itch   . hyoscyamine  (LEVSIN SL) 0.125 MG SL tablet PLEASE SEE ATTACHED FOR DETAILED DIRECTIONS   . lidocaine (XYLOCAINE) 2 % jelly APPLY TOPICALLY TO AFFECTED AREA EVERY DAY AS NEEDED (use sparingly)   . LORazepam (ATIVAN) 0.5 MG tablet Take 1 tablet (0.5 mg total) by mouth 2 (two) times daily as needed for anxiety. (Patient not taking: Reported on 09/13/2019)   . mometasone (NASONEX) 50 MCG/ACT nasal spray PLACE 2 SPRAYS INTO THE NOSE DAILY.   . pantoprazole (PROTONIX) 40 MG tablet Take 1 tablet (40 mg total) by mouth daily.   . phenylephrine (SUDAFED PE) 10 MG TABS tablet Take 10 mg by mouth every 4 (four) hours as needed.   . Polyethylene Glycol 3350 (MIRALAX PO) Take by mouth as needed.   . Prasterone, DHEA, 25 MG CAPS Take 2 mg by mouth daily.   . Probiotic Product (PROBIOTIC PO) Take by mouth daily.   . Simethicone (GAS-X PO) Take by mouth.   . tizanidine (ZANAFLEX) 2 MG capsule Take 1-2 capsules (2-4 mg total) by mouth 3 (three) times daily as needed for muscle spasms.   . traMADol (ULTRAM) 50 MG tablet Take 1 tablet (50 mg total) by mouth every 12 (twelve) hours as needed.   [name] UNABLE TO FIND Take 1 capsule by mouth daily. Dv3- vitamin D 3 plus immune support    No facility-administered encounter medications on file as of 09/24/2019.      RN Care Plan   .  "I want to manage my osteoporosis" (pt-stated)  Current Barriers:  . Chronic Disease Management support and education needs related to osteoporosis  Nurse Case Manager Clinical Goal(s):  [name] Over the next 30 days, patient will have bone density scan . Over the next 60 days, patient will collaborate with PCP regarding treatment plan  Interventions:  . Chart reviewed . Medications reviewed and previously discussed o Taking Vitamin D. Not consistent with calcium supplement. o No Rx for osteoporosis management . Previously reviewed past bone density scan results . Collaborated with PCP o Repeating dexa scan this year is reasonable considering  disease progression and consideration of starting a new treatment . Previously discussed Reclast for treatment o PCP agrees that this is a good treatment option o Patient is considering and will discuss after bone density scan . RNCM will collaborate with [facility] RT to schedule dexa scan  Patient Self Care Activities:  . Patient verbalizes understanding of plan to follow-up with PCP re: bone density testing and osteoporosis management . Performs ADL's independently . Performs IADL's independently   Please see past updates related to this goal by clicking on the "Past Updates" button in the selected goal          Plan:  The care management team will reach out to the patient again over the next 60 days.   [facility] RT will call patient to schedule bone density scan   Chong Sicilian, BSN, RN-BC [place] [facility] [place] [facility] Direct Dial: [phone]

## 2019-09-24 NOTE — Patient Instructions (Signed)
Visit Information  Goals Addressed              This Visit's Progress     Patient Stated   .  "I want to manage my osteoporosis" (pt-stated)        Current Barriers:  . Chronic Disease Management support and education needs related to osteoporosis  Nurse Case Manager Clinical Goal(s):  [name] Over the next 30 days, patient will have bone density scan . Over the next 60 days, patient will collaborate with PCP regarding treatment plan  Interventions:  . Chart reviewed . Medications reviewed and previously discussed o Taking Vitamin D. Not consistent with calcium supplement. o No Rx for osteoporosis management . Previously reviewed past bone density scan results . Collaborated with PCP o Repeating dexa scan this year is reasonable considering disease progression and consideration of starting a new treatment . Previously discussed Reclast for treatment o PCP agrees that this is a good treatment option o Patient is considering and will discuss after bone density scan . RNCM will collaborate with [facility] RT to schedule dexa scan  Patient Self Care Activities:  . Patient verbalizes understanding of plan to follow-up with PCP re: bone density testing and osteoporosis management . Performs ADL's independently . Performs IADL's independently   Please see past updates related to this goal by clicking on the "Past Updates" button in the selected goal         Patient verbalizes understanding of instructions provided today.   Follow-up Plan The care management team will reach out to the patient again over the next 60 days.   [facility] RT will call patient to schedule bone density scan  Chong Sicilian, BSN, RN-BC [place] [facility] [place] [facility] Direct Dial: [phone]

## 2019-09-27 DIAGNOSIS — K599 Functional intestinal disorder, unspecified: Secondary | ICD-10-CM | POA: Diagnosis not present

## 2019-09-27 DIAGNOSIS — R300 Dysuria: Secondary | ICD-10-CM | POA: Diagnosis not present

## 2019-10-01 DIAGNOSIS — R300 Dysuria: Secondary | ICD-10-CM | POA: Diagnosis not present

## 2019-10-04 DIAGNOSIS — R300 Dysuria: Secondary | ICD-10-CM | POA: Diagnosis not present

## 2019-10-04 DIAGNOSIS — R102 Pelvic and perineal pain: Secondary | ICD-10-CM | POA: Diagnosis not present

## 2019-10-10 DIAGNOSIS — N3010 Interstitial cystitis (chronic) without hematuria: Secondary | ICD-10-CM | POA: Diagnosis not present

## 2019-10-10 DIAGNOSIS — R350 Frequency of micturition: Secondary | ICD-10-CM | POA: Diagnosis not present

## 2019-10-15 ENCOUNTER — Ambulatory Visit: Payer: Medicare HMO | Admitting: Licensed Clinical Social Worker

## 2019-10-15 DIAGNOSIS — M810 Age-related osteoporosis without current pathological fracture: Secondary | ICD-10-CM

## 2019-10-15 DIAGNOSIS — K581 Irritable bowel syndrome with constipation: Secondary | ICD-10-CM

## 2019-10-15 DIAGNOSIS — K219 Gastro-esophageal reflux disease without esophagitis: Secondary | ICD-10-CM

## 2019-10-15 DIAGNOSIS — E782 Mixed hyperlipidemia: Secondary | ICD-10-CM

## 2019-10-15 DIAGNOSIS — F411 Generalized anxiety disorder: Secondary | ICD-10-CM

## 2019-10-15 NOTE — Patient Instructions (Addendum)
Licensed Clinical Social Worker Visit Information  Materials Provided: No  10/15/2019  Name: Kristy Garza         MRN: [id number]       DOB: [birth year]  Kristy Garza is a 69 y.o. year old female who is a primary care patient of [name], DO. The CCM team was consulted for assistance with [employer] .   Review of patient status, including review of consultants reports, other relevant assessments, and collaboration with appropriate care team members and the patient's provider was performed as part of comprehensive patient evaluation and provision of chronic care management services.    SDOH (Social Determinants of Health) assessments performed: No; risk of stress; risk of depression; risk of physical inactivity; risk of tobacco use  LCSW called client phone numbers several times today and LCSW was not able to speak via phone with client today;however, LCSW did leave phone message for Kristy Garza asking that she please call LCSW at [phone]  Follow Up Plan: LCSW to call client in next 4 weeks to talk with client about health needs of client and about management of client's health needs   LCSW was not able to speak via phone with client today; thus the patient was not able to verbalize understanding of instructions provided today and was not able to accept or decline a print copy of patient instruction materials.   [name].Sorcha Rotunno MSW, LCSW Licensed Clinical Social Worker [facility] Care Management [phone]

## 2019-10-15 NOTE — Chronic Care Management (AMB) (Signed)
Chronic Care Management    Clinical Social Work Follow Up Note  10/15/2019 Name: Kristy Garza MRN: [id number] DOB: [birth year]  Kristy Garza is a 69 y.o. year old female who is a primary care patient of [name], DO. The CCM team was consulted for assistance with [employer] .   Review of patient status, including review of consultants reports, other relevant assessments, and collaboration with appropriate care team members and the patient's provider was performed as part of comprehensive patient evaluation and provision of chronic care management services.    SDOH (Social Determinants of Health) assessments performed: No; risk of stress; risk of depression; risk of physical inactivity; risk of tobacco use    Office Visit from 08/28/2018 in [place]  PHQ-9 Total Score 0       GAD 7 : Generalized Anxiety Score 08/28/2018 05/23/2018 04/11/2018  Nervous, Anxious, on Edge 0 3 1  Control/stop worrying 0 2 0  Worry too much - different things 0 2 0  Trouble relaxing 0 2 0  Restless 0 1 0  Easily annoyed or irritable 0 1 0  Afraid - awful might happen 0 1 0  Total GAD 7 Score 0 12 1    Outpatient Encounter Medications as of 10/15/2019  Medication Sig Note  . acetaminophen (TYLENOL) 500 MG tablet Take by mouth. 01/07/2016: Received from: [facility] Received Sig: Take by mouth.  [name] albuterol (VENTOLIN HFA) 108 (90 Base) MCG/ACT inhaler Inhale 2 puffs into the lungs every 4 (four) hours as needed for wheezing or shortness of breath.   [name] azelastine (ASTELIN) 0.1 % nasal spray Place 2 sprays into both nostrils 2 (two) times daily. Use in each nostril as directed   . b complex vitamins capsule Take 1 capsule by mouth daily.   . calcium citrate-vitamin D (CITRACAL+D) 315-200 MG-UNIT tablet Take 2 tablets by mouth 2 (two) times daily.   [name] dextromethorphan-guaiFENesin (MUCINEX DM) 30-600 MG 12hr tablet Take 1 tablet by mouth 2 (two)  times daily.   . Fluticasone Furoate (ARNUITY ELLIPTA) 200 MCG/ACT AEPB TAKE 1 PUFF BY MOUTH EVERY DAY   . Halcinonide 0.1 % CREA Apply thin layer to skin twice daily as needed   . hydrOXYzine (VISTARIL) 25 MG capsule Take 1 tablet by mouth at bedtime as needed for itch   . hyoscyamine (LEVSIN SL) 0.125 MG SL tablet PLEASE SEE ATTACHED FOR DETAILED DIRECTIONS   . lidocaine (XYLOCAINE) 2 % jelly APPLY TOPICALLY TO AFFECTED AREA EVERY DAY AS NEEDED (use sparingly)   . LORazepam (ATIVAN) 0.5 MG tablet Take 1 tablet (0.5 mg total) by mouth 2 (two) times daily as needed for anxiety. (Patient not taking: Reported on 09/13/2019)   . mometasone (NASONEX) 50 MCG/ACT nasal spray PLACE 2 SPRAYS INTO THE NOSE DAILY.   . pantoprazole (PROTONIX) 40 MG tablet Take 1 tablet (40 mg total) by mouth daily.   . phenylephrine (SUDAFED PE) 10 MG TABS tablet Take 10 mg by mouth every 4 (four) hours as needed.   . Polyethylene Glycol 3350 (MIRALAX PO) Take by mouth as needed.   . Prasterone, DHEA, 25 MG CAPS Take 2 mg by mouth daily.   . Probiotic Product (PROBIOTIC PO) Take by mouth daily.   . Simethicone (GAS-X PO) Take by mouth.   . tizanidine (ZANAFLEX) 2 MG capsule Take 1-2 capsules (2-4 mg total) by mouth 3 (three) times daily as needed for muscle spasms.   . traMADol (  ULTRAM) 50 MG tablet Take 1 tablet (50 mg total) by mouth every 12 (twelve) hours as needed.   [name] UNABLE TO FIND Take 1 capsule by mouth daily. Dv3- vitamin D 3 plus immune support    No facility-administered encounter medications on file as of 10/15/2019.    LCSW called client phone numbers several times today and LCSW was not able to speak via phone with client today;however, LCSW did leave phone message for Shatina asking that she please call LCSW at [phone]  Follow Up Plan: LCSW to call client in next 4 weeks to talk with client about health needs of client and about management of client's health needs  [name].Clarinda Obi MSW,  LCSW Licensed Clinical Social Worker [facility] Care Management [phone]

## 2019-10-18 ENCOUNTER — Ambulatory Visit: Payer: Medicare HMO | Admitting: Family Medicine

## 2019-10-23 ENCOUNTER — Telehealth: Payer: Self-pay | Admitting: Family Medicine

## 2019-10-23 NOTE — Telephone Encounter (Signed)
Returned call, no answer, left message to return call 

## 2019-10-25 ENCOUNTER — Telehealth: Payer: Self-pay | Admitting: Family Medicine

## 2019-10-25 DIAGNOSIS — G8929 Other chronic pain: Secondary | ICD-10-CM

## 2019-10-25 DIAGNOSIS — G894 Chronic pain syndrome: Secondary | ICD-10-CM

## 2019-10-25 NOTE — Telephone Encounter (Signed)
Please review and advise.

## 2019-10-25 NOTE — Telephone Encounter (Signed)
Hm not sure I ever said that but I am glad to place a referral this time.

## 2019-10-25 NOTE — Telephone Encounter (Signed)
Pt called stating that she would like Dr [name] to refer her to Dr Francesco Runner for pain management. Says Dr [name] told her one time that she did not need an appt to be referred.   Phone# to Dr Ellouise Newer office is [phone].

## 2019-10-25 NOTE — Telephone Encounter (Signed)
Not sure what she would be seeing one for.  She's previously been tested for autoimmune arthritis and was negative.  Is there something specific she is looking for?

## 2019-10-25 NOTE — Telephone Encounter (Signed)
Pt aware of of referral to pain.    She also wonders if she could see an "auto-immune Doctor"  Thought?

## 2019-10-28 ENCOUNTER — Telehealth: Payer: Self-pay | Admitting: Family Medicine

## 2019-10-28 DIAGNOSIS — N959 Unspecified menopausal and perimenopausal disorder: Secondary | ICD-10-CM | POA: Diagnosis not present

## 2019-10-28 DIAGNOSIS — R5381 Other malaise: Secondary | ICD-10-CM | POA: Diagnosis not present

## 2019-10-28 NOTE — Telephone Encounter (Signed)
Needs appt

## 2019-10-28 NOTE — Telephone Encounter (Signed)
lmtcb

## 2019-10-29 DIAGNOSIS — R102 Pelvic and perineal pain: Secondary | ICD-10-CM | POA: Diagnosis not present

## 2019-10-29 NOTE — Telephone Encounter (Signed)
LMTCB

## 2019-11-12 DIAGNOSIS — N3000 Acute cystitis without hematuria: Secondary | ICD-10-CM | POA: Diagnosis not present

## 2019-11-12 DIAGNOSIS — R300 Dysuria: Secondary | ICD-10-CM | POA: Diagnosis not present

## 2019-11-12 DIAGNOSIS — R8290 Unspecified abnormal findings in urine: Secondary | ICD-10-CM | POA: Diagnosis not present

## 2019-11-18 ENCOUNTER — Ambulatory Visit (INDEPENDENT_AMBULATORY_CARE_PROVIDER_SITE_OTHER): Payer: Medicare HMO | Admitting: Licensed Clinical Social Worker

## 2019-11-18 ENCOUNTER — Encounter: Payer: Self-pay | Admitting: Family Medicine

## 2019-11-18 ENCOUNTER — Other Ambulatory Visit: Payer: Self-pay

## 2019-11-18 ENCOUNTER — Ambulatory Visit (INDEPENDENT_AMBULATORY_CARE_PROVIDER_SITE_OTHER): Payer: Medicare HMO | Admitting: Family Medicine

## 2019-11-18 VITALS — BP 117/71 | HR 74 | Temp 98.2°F | Ht 68.0 in | Wt 105.0 lb

## 2019-11-18 DIAGNOSIS — E782 Mixed hyperlipidemia: Secondary | ICD-10-CM | POA: Diagnosis not present

## 2019-11-18 DIAGNOSIS — G8929 Other chronic pain: Secondary | ICD-10-CM

## 2019-11-18 DIAGNOSIS — R6884 Jaw pain: Secondary | ICD-10-CM

## 2019-11-18 DIAGNOSIS — Z79899 Other long term (current) drug therapy: Secondary | ICD-10-CM | POA: Diagnosis not present

## 2019-11-18 DIAGNOSIS — G894 Chronic pain syndrome: Secondary | ICD-10-CM

## 2019-11-18 DIAGNOSIS — R5382 Chronic fatigue, unspecified: Secondary | ICD-10-CM

## 2019-11-18 DIAGNOSIS — R69 Illness, unspecified: Secondary | ICD-10-CM | POA: Diagnosis not present

## 2019-11-18 DIAGNOSIS — R6889 Other general symptoms and signs: Secondary | ICD-10-CM | POA: Diagnosis not present

## 2019-11-18 DIAGNOSIS — K581 Irritable bowel syndrome with constipation: Secondary | ICD-10-CM

## 2019-11-18 DIAGNOSIS — K5909 Other constipation: Secondary | ICD-10-CM

## 2019-11-18 DIAGNOSIS — M810 Age-related osteoporosis without current pathological fracture: Secondary | ICD-10-CM

## 2019-11-18 DIAGNOSIS — F411 Generalized anxiety disorder: Secondary | ICD-10-CM

## 2019-11-18 DIAGNOSIS — K219 Gastro-esophageal reflux disease without esophagitis: Secondary | ICD-10-CM

## 2019-11-18 DIAGNOSIS — L739 Follicular disorder, unspecified: Secondary | ICD-10-CM

## 2019-11-18 DIAGNOSIS — R252 Cramp and spasm: Secondary | ICD-10-CM

## 2019-11-18 MED ORDER — TRAMADOL HCL 50 MG PO TABS: 50.0000 mg | ORAL_TABLET | Freq: Two times a day (BID) | ORAL | 1 refills | Status: DC | PRN

## 2019-11-18 MED ORDER — ZOLEDRONIC ACID 5 MG/100ML IV SOLN
5.0000 mg | Freq: Once | INTRAVENOUS | Status: AC
  Administered 2023-03-24: 5 mg via INTRAVENOUS

## 2019-11-18 MED ORDER — TRULANCE 3 MG PO TABS: 1.0000 | ORAL_TABLET | Freq: Every day | ORAL | 0 refills | Status: DC

## 2019-11-18 NOTE — Patient Instructions (Addendum)
Licensed Clinical Social Worker Visit Information  Goals we discussed today:   .  Client said she wants to talk with someone about her health issues and how she deals with them (pt-stated)        Current Barriers:   Limited access to food  [facility] Concerns  in patient with chronic diagnoses of IBS, Hyperlipidemia, GAD, GERD and Osteoporosis  Pain issues  Clinical Social Work Clinical Goal(s):   Over the next 30 day days, client will work with LCSW to address concerns related to health issues of client and how she deals with health issues  Interventions:  Encouraged client to talk with RN CM about nursing needs of client  Talked with client about pain issues of client  Talked with Joslynn about her social support network (spouse, daughters, son)  Talked with client about relaxation techniques of choice (reading, listening to music, watching TV)  Talked with clientabout transport needs of client  Talked with clientabout fatigue issues of client  Talked with client about sleeping issues  Talked with client about her upcoming medical appointments  Talked with client about her mood   Talked with client about support through CCM program  Talked with client about headaches of client (she said she has headaches about 2 times weekly)  Talked with [place] about her referral to Pain Clinic in [place], [place]  Talked with client about appetite of client  Patient Self Care Activities:   Attends all scheduled provider appointments   Self administers prescribed medications  Calls provider office for new concerns or questions  Plan: Patient to contact LCSW to discuss psychosocial needs of cllient LCSW will call client in 4 weeks to discuss health needs of client  and client management of health needs Client to attend scheduled medical appointments Client to communicate with RN CM to discuss nursing needs of client  *Initial goal documentation     Follow  Up Plan:LCSW to call client in next 4 weeks to talk with client about health needs of client and about management of client's health needs  Materials Provided: No  The patient verbalized understanding of instructions provided today and declined a print copy of patient instruction materials.   [name].Vencent Hauschild MSW, LCSW Licensed Clinical Social Worker [facility] Care Management [phone]

## 2019-11-18 NOTE — Progress Notes (Signed)
Subjective: CC: Osteoporosis, constipation PCP: [name], DO BLT:JQZESP S Rayson is a 69 y.o. female presenting to clinic today for:  1.  Osteoporosis Last DEXA was performed August 2020 which showed severe osteoporosis with T score of -4.5.  After much consideration, she would like to proceed with Reclast.  She reports chronic fatigue but no bone pains.  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  She will be seeing a pain specialist for this soon  2.  Chronic constipation Patient with ongoing chronic constipation that sometimes is refractory to MiraLAX.  She takes MiraLAX on a daily basis.  She had intolerance to Linzess because it caused diarrhea but she would be willing to try something else.  She reports sensation of chronic bloating which often will impact her ability to intake food in normal amounts.  She is restrictive because her integrative doctor has recommended avoiding certain foods and she does this religiously.  3.  Cramping Patient reports cramping in her lower extremities.  She would like to have her electrolytes checked.  She brings to me lab work from March from her integrative doctor which showed normal potassium levels  4.  Skin rash Patient reports a rash in her groin area.  She is not been applying anything to the affected area but would like me to look at it today  5.  Generalized anxiety disorder Patient with ongoing anxiety and depressive symptoms but she does not want to take any medications for this.  She does not want to continue any lorazepam because she worries about dependent nature of this medicine.  Denies any overt panic attacks or physical manifestations of anxiety.  She continues to see [employer] via televisit for counseling services.  She drinks chamomile tea which does seem to help sometimes.  She is wondering if there are other any naturopathic medicine she can use   ROS: Per HPI  Allergies  Allergen Reactions    Cefuroxime Axetil Other (See Comments)    Extreme gas   Gabapentin Other (See Comments)    Constipation Constipation   Montelukast Other (See Comments)    Numbness and tingling in hand. Numbness and tingling in hand. Numbness and tingling in hand. NUMBNESS OF HANDS AND FEET. Numbness in hands and feet NUMBNESS OF HANDS AND FEET.   Pregabalin Other (See Comments)    Drowsiness, dry mouth Drowsiness, dry mouth Makes her tired and thirsty   Augmentin [Amoxicillin-Pot Clavulanate] Diarrhea   Avelox [Moxifloxacin Hcl In Nacl] Other (See Comments)    Tremors    Biaxin [Clarithromycin]     Unsure of reaction   Cedax [Ceftibuten] Other (See Comments)    tremors   Ciprofloxacin Other (See Comments) and Nausea And Vomiting    Blurred vision Blurred vision Pt can't remember reaction   Clindamycin/Lincomycin     tachycardia   Levofloxacin Other (See Comments)    Feels dehydrated   Lincomycin Other (See Comments)    tachycardia   Montelukast Sodium Other (See Comments)    Numbness and tingling in hand. Numbness and tingling in hand.   Sulfonamide Derivatives Other (See Comments)    Gi upset   Past Medical History:  Diagnosis Date   Acid reflux    Allergy    Arthritis    ARTHRITIS IN NECK BY DR. Alroy Dust [facility]   Asthma    Interstitial cystitis    Osteoporosis    PONV (postoperative nausea and vomiting)    Tinnitus  Trigeminal neuralgia    Atypical trigeminal neuralgia    Current Outpatient Medications:    acetaminophen (TYLENOL) 500 MG tablet, Take by mouth., Disp: , Rfl:    albuterol (VENTOLIN HFA) 108 (90 Base) MCG/ACT inhaler, Inhale 2 puffs into the lungs every 4 (four) hours as needed for wheezing or shortness of breath., Disp: 18 g, Rfl: 1   azelastine (ASTELIN) 0.1 % nasal spray, Place 2 sprays into both nostrils 2 (two) times daily. Use in each nostril as directed, Disp: 30 mL, Rfl: 2   b complex vitamins capsule, Take 1 capsule by  mouth daily., Disp: , Rfl:    dextromethorphan-guaiFENesin (MUCINEX DM) 30-600 MG 12hr tablet, Take 1 tablet by mouth 2 (two) times daily., Disp: , Rfl:    Fluticasone Furoate (ARNUITY ELLIPTA) 200 MCG/ACT AEPB, TAKE 1 PUFF BY MOUTH EVERY DAY, Disp: 30 each, Rfl: 5   hyoscyamine (LEVSIN SL) 0.125 MG SL tablet, PLEASE SEE ATTACHED FOR DETAILED DIRECTIONS, Disp: , Rfl:    lidocaine (XYLOCAINE) 2 % jelly, APPLY TOPICALLY TO AFFECTED AREA EVERY DAY AS NEEDED (use sparingly), Disp: 85 g, Rfl: 0   mometasone (NASONEX) 50 MCG/ACT nasal spray, PLACE 2 SPRAYS INTO THE NOSE DAILY., Disp: 17 g, Rfl: 5   pantoprazole (PROTONIX) 40 MG tablet, Take 1 tablet (40 mg total) by mouth daily., Disp: 30 tablet, Rfl: 5   phenylephrine (SUDAFED PE) 10 MG TABS tablet, Take 10 mg by mouth every 4 (four) hours as needed., Disp: , Rfl:    Polyethylene Glycol 3350 (MIRALAX PO), Take by mouth as needed., Disp: , Rfl:    Prasterone, DHEA, 25 MG CAPS, Take 2 mg by mouth daily., Disp: , Rfl:    Probiotic Product (PROBIOTIC PO), Take by mouth daily., Disp: , Rfl:    Simethicone (GAS-X PO), Take by mouth., Disp: , Rfl:    tizanidine (ZANAFLEX) 2 MG capsule, Take 1-2 capsules (2-4 mg total) by mouth 3 (three) times daily as needed for muscle spasms., Disp: 60 capsule, Rfl: 2   traMADol (ULTRAM) 50 MG tablet, Take 1 tablet (50 mg total) by mouth every 12 (twelve) hours as needed., Disp: 30 tablet, Rfl: 1   UNABLE TO FIND, Take 1 capsule by mouth daily. Dv3- vitamin D 3 plus immune support, Disp: , Rfl:  Social History   Socioeconomic History   Marital status: Married    Spouse name: [name]   Number of children: 3   Years of education: 12   Highest education level: Some college, no degree  Occupational History   Occupation: CNA    Comment: Retired  Tobacco Use   Smoking status: Never Smoker   Smokeless tobacco: Never Used  Scientific laboratory technician Use: Never used  Substance and Sexual Activity   Alcohol  use: No    Alcohol/week: 0.0 standard drinks    Comment: 01-19-2016 per pt no   Drug use: No    Comment: 01-19-2016 per pt no    Sexual activity: Not Currently    Birth control/protection: Surgical  Other Topics Concern   Not on file  Social History Narrative   Lives with husband.    Social Determinants of Health   Financial Resource Strain: Low Risk    Difficulty of Paying Living Expenses: Not hard at all  Food Insecurity: No Food Insecurity   Worried About Charity fundraiser in the Last Year: Never true   Ran Out of Food in the Last Year: Never true  Transportation Needs: No  Transportation Needs   Lack of Transportation (Medical): No   Lack of Transportation (Non-Medical): No  Physical Activity: Sufficiently Active   Days of Exercise per Week: 5 days   Minutes of Exercise per Session: 30 min  Stress: No Stress Concern Present   Feeling of Stress : Only a little  Social Connections: Engineer, building services of Communication with Friends and Family: More than three times a week   Frequency of Social Gatherings with Friends and Family: More than three times a week   Attends Religious Services: More than 4 times per year   Active Member of [employer] or Organizations: Yes   Attends Music therapist: More than 4 times per year   Marital Status: Married  Human resources officer Violence: Not At Risk   Fear of Current or Ex-Partner: No   Emotionally Abused: No   Physically Abused: No   Sexually Abused: No   Family History  Problem Relation Age of Onset   Hyperlipidemia Mother    Arthritis Mother    Cancer Mother    Lymphoma Mother    Cancer Father    Allergies Father    Allergies Sister    Allergies Sister    Allergic rhinitis Neg Hx    Angioedema Neg Hx    Asthma Neg Hx    Eczema Neg Hx    Immunodeficiency Neg Hx    Urticaria Neg Hx     Objective: Office vital signs reviewed. BP 117/71    Pulse 74    Temp 98.2 F  (36.8 C) (Temporal)    Ht $R'5\' 8"'BY$  (1.727 m)    Wt 105 lb (47.6 kg)    SpO2 99%    BMI 15.97 kg/m   Physical Examination:  General: Awake, alert, malnourished, No acute distress HEENT: Normal; sclera white Cardio: regular rate and rhythm, S1S2 heard, no murmurs appreciated Pulm: clear to auscultation bilaterally, no wheezes, rhonchi or rales; normal work of breathing on room air Extremities: warm, well perfused, No edema, cyanosis or clubbing; +2 pulses bilaterally MSK: normal gait and station Skin: dry; lesions of concern are consistent with folliculitis in the right upper thigh area.  No evidence of secondary bacterial infection Psych: Mood stable, speech normal.  Assessment/ Plan: 69 y.o. female   1. Chronic pain syndrome Seeing PMR soon  2. Chronic jaw pain Stable. The Narcotic Database has been reviewed.  There were no red flags.   - traMADol (ULTRAM) 50 MG tablet; Take 1 tablet (50 mg total) by mouth every 12 (twelve) hours as needed.  Dispense: 30 tablet; Refill: 1  3. Generalized anxiety disorder Camomile tea/ Lavender. Agree with avoiding Benzos.  Consider SSRI if patient agreeable.  Avoid St. John's wort given multiple drug interactions - TSH  4. Age-related osteoporosis without current pathological fracture Will arrange for Reclast for at least 3 years if tolerated. - CMP14+EGFR - TSH - VITAMIN D 25 Hydroxy (Vit-D Deficiency, Fractures) - CBC with Differential  5. Mixed hyperlipidemia - Lipid Panel  6. Leg cramping - Magnesium  7. Chronic fatigue Question due to malnutrition. Increase Caloric intake - Vitamin B12  8. Chronic constipation Trial of Trulance. 1 week of samples provide - Plecanatide (TRULANCE) 3 MG TABS; Take 1 tablet by mouth daily.  Dispense: 30 tablet; Refill: 0  9. Folliculitis Hibaclens, Dial  10. Controlled substance agreement signed - ToxASSURE Select 13 (MW), Urine   No orders of the defined types were placed in this  encounter.  Meds ordered  this encounter  Medications   traMADol (ULTRAM) 50 MG tablet    Sig: Take 1 tablet (50 mg total) by mouth every 12 (twelve) hours as needed.    Dispense:  30 tablet    Refill:  1   Plecanatide (TRULANCE) 3 MG TABS    Sig: Take 1 tablet by mouth daily.    Dispense:  30 tablet    Refill:  0   zoledronic acid (RECLAST) injection 5 mg     [name], DO [place] [phone]

## 2019-11-18 NOTE — Chronic Care Management (AMB) (Signed)
Chronic Care Management    Clinical Social Work Follow Up Note  11/18/2019 Name: Kristy Garza MRN: [id number] DOB: 06-27-1950  Kristy Garza is a 69 y.o. year old female who is a primary care patient of [name], DO. The CCM team was consulted for assistance with [employer] .   Review of patient status, including review of consultants reports, other relevant assessments, and collaboration with appropriate care team members and the patient's provider was performed as part of comprehensive patient evaluation and provision of chronic care management services.    SDOH (Social Determinants of Health) assessments performed: No;risk for social isolation;risk for depression; risk for stress; risk for physical inactivity    Office Visit from 11/18/2019 in [place]  PHQ-9 Total Score 10       GAD 7 : Generalized Anxiety Score 11/18/2019 08/28/2018 05/23/2018 04/11/2018  Nervous, Anxious, on Edge 2 0 3 1  Control/stop worrying 2 0 2 0  Worry too much - different things 2 0 2 0  Trouble relaxing 2 0 2 0  Restless 2 0 1 0  Easily annoyed or irritable 0 0 1 0  Afraid - awful might happen 1 0 1 0  Total GAD 7 Score 11 0 12 1  Anxiety Difficulty Somewhat difficult - - -    Outpatient Encounter Medications as of 11/18/2019  Medication Sig Note  . acetaminophen (TYLENOL) 500 MG tablet Take by mouth. 01/07/2016: Received from: [facility] Received Sig: Take by mouth.  [name] albuterol (VENTOLIN HFA) 108 (90 Base) MCG/ACT inhaler Inhale 2 puffs into the lungs every 4 (four) hours as needed for wheezing or shortness of breath.   [name] azelastine (ASTELIN) 0.1 % nasal spray Place 2 sprays into both nostrils 2 (two) times daily. Use in each nostril as directed   . b complex vitamins capsule Take 1 capsule by mouth daily.   [name] dextromethorphan-guaiFENesin (MUCINEX DM) 30-600 MG 12hr tablet Take 1 tablet by mouth 2 (two) times daily.   . Fluticasone  Furoate (ARNUITY ELLIPTA) 200 MCG/ACT AEPB TAKE 1 PUFF BY MOUTH EVERY DAY   . hyoscyamine (LEVSIN SL) 0.125 MG SL tablet PLEASE SEE ATTACHED FOR DETAILED DIRECTIONS   . lidocaine (XYLOCAINE) 2 % jelly APPLY TOPICALLY TO AFFECTED AREA EVERY DAY AS NEEDED (use sparingly)   . mometasone (NASONEX) 50 MCG/ACT nasal spray PLACE 2 SPRAYS INTO THE NOSE DAILY.   . pantoprazole (PROTONIX) 40 MG tablet Take 1 tablet (40 mg total) by mouth daily.   . phenylephrine (SUDAFED PE) 10 MG TABS tablet Take 10 mg by mouth every 4 (four) hours as needed.   [name] Plecanatide (TRULANCE) 3 MG TABS Take 1 tablet by mouth daily.   . Polyethylene Glycol 3350 (MIRALAX PO) Take by mouth as needed.   . Prasterone, DHEA, 25 MG CAPS Take 2 mg by mouth daily.   . Probiotic Product (PROBIOTIC PO) Take by mouth daily.   . Simethicone (GAS-X PO) Take by mouth.   . tizanidine (ZANAFLEX) 2 MG capsule Take 1-2 capsules (2-4 mg total) by mouth 3 (three) times daily as needed for muscle spasms.   . traMADol (ULTRAM) 50 MG tablet Take 1 tablet (50 mg total) by mouth every 12 (twelve) hours as needed.   [name] UNABLE TO FIND Take 1 capsule by mouth daily. Dv3- vitamin D 3 plus immune support    Facility-Administered Encounter Medications as of 11/18/2019  Medication  . zoledronic acid (RECLAST) injection 5  mg     Goals    .  Client said she wants to talk with someone about her health issues and how she deals with them (pt-stated)      Current Barriers:  . Limited access to food . Mental Health Concerns  in patient with chronic diagnoses of IBS, Hyperlipidemia, GAD, GERD and Osteoporosis . Pain issues  Clinical Social Work Clinical Goal(s):  [name] Over the next 30 day days, client will work with LCSW to address concerns related to health issues of client and how she deals with health issues  Interventions:  Encouraged client to talk with RN CM about nursing needs of client  Talked with client about pain issues of client  Talked with  Evana about her social support network (spouse, daughters, son)  Talked with client about relaxation techniques of choice (reading, listening to music, watching TV)  Talked with clientabout transport needs of client  Talked with clientabout fatigue issues of client  Talked with client about sleeping issues  Talked with client about her upcoming medical appointments  Talked with client about her mood   Talked with client about support through CCM program  Talked with client about headaches of client (she said she has headaches about 2 times weekly)  Talked with Edynn about her referral to Pain Clinic in [place], [place]  Talked with client about appetite of client  Patient Self Care Activities:  . Attends all scheduled provider appointments  . Self administers prescribed medications . Calls provider office for new concerns or questions  Plan: Patient to contact LCSW to discuss psychosocial needs of cllient LCSW will call client in 4 weeks to discuss health needs of client  and client management of health needs Client to attend scheduled medical appointments Client to communicate with RN CM to discuss nursing needs of client  *Initial goal documentation     Follow Up Plan: LCSW to call client in next 4 weeks to talk with client about health needs of client and about management of client's health needs  [name].Nansi Birmingham MSW, LCSW Licensed Clinical Social Worker [facility] [phone]

## 2019-11-18 NOTE — Patient Instructions (Signed)

## 2019-11-19 LAB — CMP14+EGFR
ALT: 23 IU/L (ref 0–32)
AST: 37 IU/L (ref 0–40)
Albumin/Globulin Ratio: 1.9 (ref 1.2–2.2)
Albumin: 4.5 g/dL (ref 3.8–4.8)
Alkaline Phosphatase: 86 IU/L (ref 48–121)
BUN/Creatinine Ratio: 9 — ABNORMAL LOW (ref 12–28)
BUN: 7 mg/dL — ABNORMAL LOW (ref 8–27)
Bilirubin Total: 0.4 mg/dL (ref 0.0–1.2)
CO2: 24 mmol/L (ref 20–29)
Calcium: 9.8 mg/dL (ref 8.7–10.3)
Chloride: 103 mmol/L (ref 96–106)
Creatinine, Ser: 0.74 mg/dL (ref 0.57–1.00)
GFR calc Af Amer: 96 mL/min/{1.73_m2} (ref >59)
GFR calc non Af Amer: 84 mL/min/{1.73_m2} (ref >59)
Globulin, Total: 2.4 g/dL (ref 1.5–4.5)
Glucose: 93 mg/dL (ref 65–99)
Potassium: 4.3 mmol/L (ref 3.5–5.2)
Sodium: 139 mmol/L (ref 134–144)
Total Protein: 6.9 g/dL (ref 6.0–8.5)

## 2019-11-19 LAB — CBC WITH DIFFERENTIAL/PLATELET
Basophils Absolute: 0 10*3/uL (ref 0.0–0.2)
Basos: 1 %
EOS (ABSOLUTE): 0.1 10*3/uL (ref 0.0–0.4)
Eos: 2 %
Hematocrit: 36.1 % (ref 34.0–46.6)
Hemoglobin: 12.2 g/dL (ref 11.1–15.9)
Immature Grans (Abs): 0 10*3/uL (ref 0.0–0.1)
Immature Granulocytes: 0 %
Lymphocytes Absolute: 0.8 10*3/uL (ref 0.7–3.1)
Lymphs: 18 %
MCH: 30.9 pg (ref 26.6–33.0)
MCHC: 33.8 g/dL (ref 31.5–35.7)
MCV: 91 fL (ref 79–97)
Monocytes Absolute: 0.4 10*3/uL (ref 0.1–0.9)
Monocytes: 9 %
Neutrophils Absolute: 3 10*3/uL (ref 1.4–7.0)
Neutrophils: 70 %
Platelets: 218 10*3/uL (ref 150–450)
RBC: 3.95 x10E6/uL (ref 3.77–5.28)
RDW: 11.7 % (ref 11.7–15.4)
WBC: 4.4 10*3/uL (ref 3.4–10.8)

## 2019-11-19 LAB — LIPID PANEL
Chol/HDL Ratio: 2.5 ratio (ref 0.0–4.4)
Cholesterol, Total: 176 mg/dL (ref 100–199)
HDL: 70 mg/dL (ref >39)
LDL Chol Calc (NIH): 96 mg/dL (ref 0–99)
Triglycerides: 47 mg/dL (ref 0–149)
VLDL Cholesterol Cal: 10 mg/dL (ref 5–40)

## 2019-11-19 LAB — VITAMIN B12: Vitamin B-12: 703 pg/mL (ref 232–1245)

## 2019-11-19 LAB — MAGNESIUM: Magnesium: 2 mg/dL (ref 1.6–2.3)

## 2019-11-19 LAB — VITAMIN D 25 HYDROXY (VIT D DEFICIENCY, FRACTURES): Vit D, 25-Hydroxy: 64.8 ng/mL (ref 30.0–100.0)

## 2019-11-19 LAB — TSH: TSH: 0.467 u[IU]/mL (ref 0.450–4.500)

## 2019-11-21 LAB — TOXASSURE SELECT 13 (MW), URINE

## 2019-11-22 ENCOUNTER — Ambulatory Visit: Payer: Medicare HMO | Admitting: *Deleted

## 2019-11-22 DIAGNOSIS — F411 Generalized anxiety disorder: Secondary | ICD-10-CM

## 2019-11-22 DIAGNOSIS — E782 Mixed hyperlipidemia: Secondary | ICD-10-CM

## 2019-11-22 DIAGNOSIS — M810 Age-related osteoporosis without current pathological fracture: Secondary | ICD-10-CM

## 2019-11-22 DIAGNOSIS — G894 Chronic pain syndrome: Secondary | ICD-10-CM

## 2019-11-22 DIAGNOSIS — E440 Moderate protein-calorie malnutrition: Secondary | ICD-10-CM

## 2019-11-22 NOTE — Chronic Care Management (AMB) (Signed)
Chronic Care Management   Follow Up Note   11/22/2019 Name: Kristy Garza MRN: [id number] DOB: 28-Jan-1951  Referred by: [name], DO Reason for referral : Chronic Care Management (RN follow-up)   Kristy Garza is a 69 y.o. year old female who is a primary care patient of [name], DO. The CCM team was consulted for assistance with chronic disease management and care coordination needs.    Review of patient status, including review of consultants reports, relevant laboratory and other test results, and collaboration with appropriate care team members and the patient's provider was performed as part of comprehensive patient evaluation and provision of chronic care management services.    SDOH (Social Determinants of Garza) assessments performed: No See Care Plan activities for detailed interventions related to [facility])     Outpatient Encounter Medications as of 11/22/2019  Medication Sig Note  . acetaminophen (TYLENOL) 500 MG tablet Take by mouth. 01/07/2016: Received from: [facility] Received Sig: Take by mouth.  Kristy Garza albuterol (VENTOLIN HFA) 108 (90 Base) MCG/ACT inhaler Inhale 2 puffs into the lungs every 4 (four) hours as needed for wheezing or shortness of breath.   Kristy Garza azelastine (ASTELIN) 0.1 % nasal spray Place 2 sprays into both nostrils 2 (two) times daily. Use in each nostril as directed   . b complex vitamins capsule Take 1 capsule by mouth daily.   Kristy Garza dextromethorphan-guaiFENesin (MUCINEX DM) 30-600 MG 12hr tablet Take 1 tablet by mouth 2 (two) times daily.   . Fluticasone Furoate (ARNUITY ELLIPTA) 200 MCG/ACT AEPB TAKE 1 PUFF BY MOUTH EVERY DAY   . hyoscyamine (LEVSIN SL) 0.125 MG SL tablet PLEASE SEE ATTACHED FOR DETAILED DIRECTIONS   . lidocaine (XYLOCAINE) 2 % jelly APPLY TOPICALLY TO AFFECTED AREA EVERY DAY AS NEEDED (use sparingly)   . mometasone (NASONEX) 50 MCG/ACT nasal spray PLACE 2 SPRAYS INTO THE NOSE DAILY.   .  pantoprazole (PROTONIX) 40 MG tablet Take 1 tablet (40 mg total) by mouth daily.   . phenylephrine (SUDAFED PE) 10 MG TABS tablet Take 10 mg by mouth every 4 (four) hours as needed.   Kristy Garza Plecanatide (TRULANCE) 3 MG TABS Take 1 tablet by mouth daily.   . Polyethylene Glycol 3350 (MIRALAX PO) Take by mouth as needed.   . Prasterone, DHEA, 25 MG CAPS Take 2 mg by mouth daily.   . Probiotic Product (PROBIOTIC PO) Take by mouth daily.   . Simethicone (GAS-X PO) Take by mouth.   . tizanidine (ZANAFLEX) 2 MG capsule Take 1-2 capsules (2-4 mg total) by mouth 3 (three) times daily as needed for muscle spasms.   . traMADol (ULTRAM) 50 MG tablet Take 1 tablet (50 mg total) by mouth every 12 (twelve) hours as needed.   Kristy Garza UNABLE TO FIND Take 1 capsule by mouth daily. Dv3- vitamin D 3 plus immune support    Facility-Administered Encounter Medications as of 11/22/2019  Medication  . zoledronic acid (RECLAST) injection 5 mg     RN Care Plan             This Visit's Progress     Patient Stated   .  "I want to feel better overall and stronger" (pt-stated)        CARE PLAN ENTRY (see longitudinal plan of care for additional care plan information)  Current Barriers:  . Anxiety . Malnutrition . Osteoporosis . Chronic pain  Nurse Case Manager Clinical Goal(s):  Kristy Garza Over the next 90 days,  patient will continue to talk with the CCM team regarding overall wellbeing  . Over the next 90 days, patient will keep all scheduled medical appointments  Interventions:  . Inter-disciplinary care team collaboration (see longitudinal plan of care) . Chart reviewed including recent office notes and lab results . Reviewed lab results with patient . Discussed that she experiences leg cramps at night . Discussed that potassium and magnesium levels were normal . Encouraged adequate hydration . Discussed magnesium supplements that chiropractor recommended . Discussed constipation treatment o Using miralax and  metamucil o Encouraged to drink plenty of water/juice with metamucil o Discussed magnesium oxide for constipation . Recommended a whole food multivitamin if she is interested in taking a multivitamin supplement . Discussed nutrition and increasing calorie intake . Encouraged to eat a variety of colorful fruits and vegetables to get the most nutrients . Discussed affect of diet and malnutrition on energy level and overall well-being . Encouraged to talk with Kristy Garza regarding psychosocial issues . Encouraged to keep all medical appointments . Encouraged to reach out to Kristy Garza as needed  Patient Self Care Activities:  . Performs ADL's independently . Performs IADL's independently  Initial goal documentation     .  "I want to manage my osteoporosis" (pt-stated)        Current Barriers:  . Chronic Disease Management support and education needs related to osteoporosis  Nurse Case Manager Clinical Goal(s):  Kristy Garza Over the next 30 days, patient will have bone density scan . Over the next 60 days, patient will collaborate with PCP regarding treatment plan  Interventions:  . Chart reviewed including recent office notes . Medications reviewed and previously discussed o Taking Vitamin D. Not consistent with calcium supplement. o No Rx for osteoporosis management . Encouraged patient to take 1200mg  calcium along with Vit D daily . Discussed concerns re: calcium causing constipation o Recommendations on OTC constipation management discussed . Previously reviewed past bone density scan results . Discussed Reclast for treatment o PCP agrees that this is a good treatment option o Patient has agreed but would like to wait until she feels "stronger" o Advised that the sooner she gets started with treatment, the better . Previously provided with RNCM contact number and encouraged to reach out as needed . Encouraged patient to work with PCP office to schedule Reclast infusion  Patient Self Care  Activities:  . Patient verbalizes understanding of plan to follow-up with PCP re: bone density testing and osteoporosis management . Performs ADL's independently . Performs IADL's independently   Please see past updates related to this goal by clicking on the "Past Updates" button in the selected goal      .  "I want to prevent Covid infection" (pt-stated)        CARE PLAN ENTRY (see longitudinal plan of care for additional care plan information)  Current Barriers:  Kristy Garza Knowledge Deficits related to covid vaccine  Nurse Case Manager Clinical Goal(s):  Kristy Garza Over the next 30 days, patient will have first Covid vaccine  Interventions:  . Inter-disciplinary care team collaboration (see longitudinal plan of care) . Chart reviewed . Discussed covid vaccine with patient . Discussed concerns and questions answered to patient's satisfaction . Advised that she can schedule an appt at [facility] on Wednesdays for the vaccine. If Wednesdays do not work then check with [employer] and [employer]. . Discussed that her husband does not want to get the vaccine . Encouraged to continue wearing a mask, social distancing, and good  handwashing  . Encouraged patient to reach out to [facility] team as needed  Patient Self Care Activities:  . Performs ADL's independently . Performs IADL's independently  Initial goal documentation     .  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  Current Barriers:  . Non-adherence to prescribed medication regimen due to anxiety over potential addiction . Generalized anxiety disorder  Nurse Case Manager Clinical Goal(s):  Kristy Garza Over the next 60 days, patient will demonstrate improved adherence to prescribed treatment plan for facial/jaw pain management as evidenced byself reported improvement in pain control. . Over the next 30 days, patient will keep  appointment with pain management specialist  Interventions:  . Chart reviewed including recent office notes and referral notes . Medications reviewed and discussed . Discussed referral to pain management provider . Reviewed and discussed upcoming appointments . Encouraged patient to keep all medical appointments . Encouraged patient to reach out to [facility] as needed . Encouraged patient to talk with Kristy Garza regarding psychosocial issues  Patient Self Care Activities:  . Performs ADL's independently . Performs IADL's independently  Please see past updates related to this goal by clicking on the "Past Updates" button in the selected goal           Plan:   The care management team will reach out to the patient again over the next 60 days.    Chong Sicilian, BSN, RN-BC Embedded Chronic Care Manager [facility] / [facility] Direct Dial: [phone]

## 2019-11-22 NOTE — Patient Instructions (Signed)
Visit Information  Goals Addressed              This Visit's Progress     Patient Stated   .  "I want to feel better overall and stronger" (pt-stated)        CARE PLAN ENTRY (see longitudinal plan of care for additional care plan information)  Current Barriers:  . Anxiety . Malnutrition . Osteoporosis . Chronic pain  Nurse Case Manager Clinical Goal(s):  [name] Over the next 90 days, patient will continue to talk with the CCM team regarding overall wellbeing  . Over the next 90 days, patient will keep all scheduled medical appointments  Interventions:  . Inter-disciplinary care team collaboration (see longitudinal plan of care) . Chart reviewed including recent office notes and lab results . Reviewed lab results with patient . Discussed that she experiences leg cramps at night . Discussed that potassium and magnesium levels were normal . Encouraged adequate hydration . Discussed magnesium supplements that chiropractor recommended . Discussed constipation treatment o Using miralax and metamucil o Encouraged to drink plenty of water/juice with metamucil o Discussed magnesium oxide for constipation . Recommended a whole food multivitamin if she is interested in taking a multivitamin supplement . Discussed nutrition and increasing calorie intake . Encouraged to eat a variety of colorful fruits and vegetables to get the most nutrients . Discussed affect of diet and malnutrition on energy level and overall well-being . Encouraged to talk with LCSW regarding psychosocial issues . Encouraged to keep all medical appointments . Encouraged to reach out to [facility] as needed  Patient Self Care Activities:  . Performs ADL's independently . Performs IADL's independently  Initial goal documentation     .  "I want to manage my osteoporosis" (pt-stated)        Current Barriers:  . Chronic Disease Management support and education needs related to osteoporosis  Nurse Case Manager Clinical  Goal(s):  [name] Over the next 30 days, patient will have bone density scan . Over the next 60 days, patient will collaborate with PCP regarding treatment plan  Interventions:  . Chart reviewed including recent office notes . Medications reviewed and previously discussed o Taking Vitamin D. Not consistent with calcium supplement. o No Rx for osteoporosis management . Encouraged patient to take 1200mg  calcium along with Vit D daily . Discussed concerns re: calcium causing constipation o Recommendations on OTC constipation management discussed . Previously reviewed past bone density scan results . Discussed Reclast for treatment o PCP agrees that this is a good treatment option o Patient has agreed but would like to wait until she feels "stronger" o Advised that the sooner she gets started with treatment, the better . Previously provided with RNCM contact number and encouraged to reach out as needed . Encouraged patient to work with PCP office to schedule Reclast infusion  Patient Self Care Activities:  . Patient verbalizes understanding of plan to follow-up with PCP re: bone density testing and osteoporosis management . Performs ADL's independently . Performs IADL's independently   Please see past updates related to this goal by clicking on the "Past Updates" button in the selected goal      .  "I want to prevent Covid infection" (pt-stated)        CARE PLAN ENTRY (see longitudinal plan of care for additional care plan information)  Current Barriers:  [name] Knowledge Deficits related to covid vaccine  Nurse Case Manager Clinical Goal(s):  [name] Over the next 30 days, patient will have  first Covid vaccine  Interventions:  . Inter-disciplinary care team collaboration (see longitudinal plan of care) . Chart reviewed . Discussed covid vaccine with patient . Discussed concerns and questions answered to patient's satisfaction . Advised that she can schedule an appt at [facility] on Wednesdays for  the vaccine. If Wednesdays do not work then check with [employer] and [employer]. . Discussed that her husband does not want to get the vaccine . Encouraged to continue wearing a mask, social distancing, and good handwashing  . Encouraged patient to reach out to [facility] team as needed  Patient Self Care Activities:  . Performs ADL's independently . Performs IADL's independently  Initial goal documentation     .  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  Current Barriers:  . Non-adherence to prescribed medication regimen due to anxiety over potential addiction . Generalized anxiety disorder  Nurse Case Manager Clinical Goal(s):  [name] Over the next 60 days, patient will demonstrate improved adherence to prescribed treatment plan for facial/jaw pain management as evidenced byself reported improvement in pain control. . Over the next 30 days, patient will keep appointment with pain management specialist  Interventions:  . Chart reviewed including recent office notes and referral notes . Medications reviewed and discussed . Discussed referral to pain management provider . Reviewed and discussed upcoming appointments . Encouraged patient to keep all medical appointments . Encouraged patient to reach out to [facility] as needed . Encouraged patient to talk with LCSW regarding psychosocial issues  Patient Self Care Activities:  . Performs ADL's independently . Performs IADL's independently  Please see past updates related to this goal by clicking on the "Past Updates" button in the selected goal          Patient verbalizes understanding of instructions provided today.   Follow-up Plan The care management team will reach out to the patient again over the next 60 days.   Chong Sicilian, BSN, RN-BC Embedded Chronic Care Manager [facility]  / [place] [facility] Direct Dial: [phone]

## 2019-11-26 DIAGNOSIS — N3000 Acute cystitis without hematuria: Secondary | ICD-10-CM | POA: Diagnosis not present

## 2019-11-26 DIAGNOSIS — R350 Frequency of micturition: Secondary | ICD-10-CM | POA: Diagnosis not present

## 2019-12-04 DIAGNOSIS — R300 Dysuria: Secondary | ICD-10-CM | POA: Diagnosis not present

## 2019-12-04 DIAGNOSIS — R102 Pelvic and perineal pain: Secondary | ICD-10-CM | POA: Diagnosis not present

## 2019-12-05 DIAGNOSIS — R300 Dysuria: Secondary | ICD-10-CM | POA: Diagnosis not present

## 2019-12-05 DIAGNOSIS — R102 Pelvic and perineal pain: Secondary | ICD-10-CM | POA: Diagnosis not present

## 2019-12-09 ENCOUNTER — Other Ambulatory Visit: Payer: Self-pay | Admitting: Family Medicine

## 2019-12-09 ENCOUNTER — Telehealth: Payer: Self-pay | Admitting: Family Medicine

## 2019-12-09 DIAGNOSIS — Z1231 Encounter for screening mammogram for malignant neoplasm of breast: Secondary | ICD-10-CM

## 2019-12-09 NOTE — Telephone Encounter (Signed)
REFERRAL REQUEST Telephone Note  Have you been seen at our office for this problem? Yes (Advise that they may need an appointment with their PCP before a referral can be done)  Reason for Referral: gas & bloating a lot Referral discussed with patient: Yes per pt. Best contact number of patient for referral team: [phone]   Has patient been seen by a specialist for this issue before: No Patient provider preference for referral: [facility] Patient location preference for referral: [place], [place]   Patient notified that referrals can take up to a week or longer to process. If they haven't heard anything within a week they should call back and speak with the referral department.  [facility] pt.  Please call pt.

## 2019-12-10 ENCOUNTER — Telehealth: Payer: Self-pay | Admitting: Family Medicine

## 2019-12-10 ENCOUNTER — Ambulatory Visit: Payer: Medicare HMO | Admitting: *Deleted

## 2019-12-10 DIAGNOSIS — K5909 Other constipation: Secondary | ICD-10-CM

## 2019-12-10 DIAGNOSIS — M810 Age-related osteoporosis without current pathological fracture: Secondary | ICD-10-CM

## 2019-12-10 DIAGNOSIS — F411 Generalized anxiety disorder: Secondary | ICD-10-CM

## 2019-12-10 NOTE — Telephone Encounter (Signed)
Patient aware.

## 2019-12-10 NOTE — Telephone Encounter (Signed)
Patient states that she is having BMs but is having abdominal tightness, gas and bloating.  Patient has take 3 gasX today with some relief.  Instructions on gasX states not to take more that 4 in 24 hours.  Patient also states that she has been eating fruits and drinking a lot of fruit juices.  Advised patient that fruits and fruit juices can cause flatulence

## 2019-12-10 NOTE — Telephone Encounter (Signed)
Have her use MiraLAX twice a day and an enema such as a suds enema over-the-counter or a fleets enema and she could even do milk of magnesia to clear her bowels and that should help her stomach.

## 2019-12-10 NOTE — Chronic Care Management (AMB) (Signed)
Chronic Care Management   Follow Up Note   12/10/2019 Name: Kristy Garza MRN: [id number] DOB: 1950-12-21  Referred by: [name], DO Reason for referral : Chronic Care Management (Incoming patient call)   Kristy Garza is a 69 y.o. year old female who is a primary care patient of [name], DO. The CCM team was consulted for assistance with chronic disease management and care coordination needs.    Review of patient status, including review of consultants reports, relevant laboratory and other test results, and collaboration with appropriate care team members and the patient's provider was performed as part of comprehensive patient evaluation and provision of chronic care management services.    SDOH (Social Determinants of Health) assessments performed: No See Care Plan activities for detailed interventions related to [facility])     Outpatient Encounter Medications as of 12/10/2019  Medication Sig Note  . acetaminophen (TYLENOL) 500 MG tablet Take by mouth. 01/07/2016: Received from: [facility] Received Sig: Take by mouth.  [name] albuterol (VENTOLIN HFA) 108 (90 Base) MCG/ACT inhaler Inhale 2 puffs into the lungs every 4 (four) hours as needed for wheezing or shortness of breath.   [name] azelastine (ASTELIN) 0.1 % nasal spray Place 2 sprays into both nostrils 2 (two) times daily. Use in each nostril as directed   . b complex vitamins capsule Take 1 capsule by mouth daily.   [name] dextromethorphan-guaiFENesin (MUCINEX DM) 30-600 MG 12hr tablet Take 1 tablet by mouth 2 (two) times daily.   . Fluticasone Furoate (ARNUITY ELLIPTA) 200 MCG/ACT AEPB TAKE 1 PUFF BY MOUTH EVERY DAY   . hyoscyamine (LEVSIN SL) 0.125 MG SL tablet PLEASE SEE ATTACHED FOR DETAILED DIRECTIONS   . lidocaine (XYLOCAINE) 2 % jelly APPLY TOPICALLY TO AFFECTED AREA EVERY DAY AS NEEDED (use sparingly)   . mometasone (NASONEX) 50 MCG/ACT nasal spray PLACE 2 SPRAYS INTO THE NOSE DAILY.   .  pantoprazole (PROTONIX) 40 MG tablet Take 1 tablet (40 mg total) by mouth daily.   . phenylephrine (SUDAFED PE) 10 MG TABS tablet Take 10 mg by mouth every 4 (four) hours as needed.   [name] Plecanatide (TRULANCE) 3 MG TABS Take 1 tablet by mouth daily.   . Polyethylene Glycol 3350 (MIRALAX PO) Take by mouth as needed.   . Prasterone, DHEA, 25 MG CAPS Take 2 mg by mouth daily.   . Probiotic Product (PROBIOTIC PO) Take by mouth daily.   . Simethicone (GAS-X PO) Take by mouth.   . tizanidine (ZANAFLEX) 2 MG capsule Take 1-2 capsules (2-4 mg total) by mouth 3 (three) times daily as needed for muscle spasms.   . traMADol (ULTRAM) 50 MG tablet Take 1 tablet (50 mg total) by mouth every 12 (twelve) hours as needed.   [name] UNABLE TO FIND Take 1 capsule by mouth daily. Dv3- vitamin D 3 plus immune support    Facility-Administered Encounter Medications as of 12/10/2019  Medication  . zoledronic acid (RECLAST) injection 5 mg      RN Care Plan: Goals Addressed              This Visit's Progress     Patient Stated   .  "I need help with constipation" (pt-stated)        CARE PLAN ENTRY (see longitudinal plan of care for additional care plan information)  Current Barriers:  . Care Coordination needs related to constipation in a patient with IBS, functional constipation, osteoporosis, anxiety, and asthma.  Nurse  Case Manager Clinical Goal(s):  [name] Over the next 24 hours, patient will have a complete bowel movement.  . Over the next 24 hours, patient will talk with gastroenterologist if she has still not had a complete bowel movement  Interventions:  . Inter-disciplinary care team collaboration (see longitudinal plan of care) . Reviewed and discussed medications . Reviewed chart . Discussed HPI o Last "normal" bowel movement was 5 days ago o Had cloud, watery BM about an hour ago o No abdominal cramping or pain o Positive for bloating . Discussed at home treatments o One dose of miralax  yesterday o Dulcolax suppository last night o 4 doses of miralax this morning and 1 tablet of dulcolax o Has eaten prunes . Encouraged to continue water/juice for hydration . Discussed how these medications worked and encouraged patient give them some more time . Discussed that she is also waiting for a call back from GI . Advised to reach out to GI with any new or worsening symptoms and to reach out to [facility] or GI if no bowel movement by this evening.   Patient Self Care Activities:  . Performs ADL's independently . Performs IADL's independently  Initial goal documentation         Plan:  CCM team will follow up as planned Patient will reach out to GI or RNCM with any new or worsening symptoms or if no bowel movement in the next 5 hours   Chong Sicilian, BSN, RN-BC [place] / [place] [facility] Direct Dial: [phone]

## 2019-12-10 NOTE — Patient Instructions (Signed)
Visit Information  Goals Addressed              This Visit's Progress     Patient Stated   .  "I need help with constipation" (pt-stated)        CARE PLAN ENTRY (see longitudinal plan of care for additional care plan information)  Current Barriers:  . Care Coordination needs related to constipation in a patient with IBS, functional constipation, osteoporosis, anxiety, and asthma.  Nurse Case Manager Clinical Goal(s):  [name] Over the next 24 hours, patient will have a complete bowel movement.  . Over the next 24 hours, patient will talk with gastroenterologist if she has still not had a complete bowel movement  Interventions:  . Inter-disciplinary care team collaboration (see longitudinal plan of care) . Reviewed and discussed medications . Reviewed chart . Discussed HPI o Last "normal" bowel movement was 5 days ago o Had cloud, watery BM about an hour ago o No abdominal cramping or pain o Positive for bloating . Discussed at home treatments o One dose of miralax yesterday o Dulcolax suppository last night o 4 doses of miralax this morning and 1 tablet of dulcolax o Has eaten prunes . Encouraged to continue water/juice for hydration . Discussed how these medications worked and encouraged patient give them some more time . Discussed that she is also waiting for a call back from GI . Advised to reach out to GI with any new or worsening symptoms and to reach out to [facility] or GI if no bowel movement by this evening.   Patient Self Care Activities:  . Performs ADL's independently . Performs IADL's independently  Initial goal documentation        Patient verbalizes understanding of instructions provided today.    Plan:  CCM team will follow up as planned Patient will reach out to GI or RNCM with any new or worsening symptoms or if no bowel movement in the next 5 hours   Chong Sicilian, BSN, RN-BC [place] / [place]  Management Direct Dial: [phone]

## 2019-12-15 DIAGNOSIS — K5900 Constipation, unspecified: Secondary | ICD-10-CM | POA: Diagnosis not present

## 2019-12-16 ENCOUNTER — Other Ambulatory Visit: Payer: Self-pay | Admitting: Family Medicine

## 2019-12-16 DIAGNOSIS — K5909 Other constipation: Secondary | ICD-10-CM

## 2019-12-16 DIAGNOSIS — R140 Abdominal distension (gaseous): Secondary | ICD-10-CM

## 2019-12-16 NOTE — Telephone Encounter (Signed)
Second opinion referral placed

## 2019-12-17 ENCOUNTER — Ambulatory Visit: Payer: Self-pay | Admitting: Allergy and Immunology

## 2019-12-17 ENCOUNTER — Encounter: Payer: Self-pay | Admitting: Family Medicine

## 2019-12-17 ENCOUNTER — Ambulatory Visit (INDEPENDENT_AMBULATORY_CARE_PROVIDER_SITE_OTHER): Payer: Medicare HMO | Admitting: Family Medicine

## 2019-12-17 ENCOUNTER — Other Ambulatory Visit: Payer: Self-pay

## 2019-12-17 VITALS — BP 138/82 | HR 85 | Temp 98.3°F | Ht 68.0 in | Wt 110.0 lb

## 2019-12-17 DIAGNOSIS — J302 Other seasonal allergic rhinitis: Secondary | ICD-10-CM | POA: Diagnosis not present

## 2019-12-17 DIAGNOSIS — R300 Dysuria: Secondary | ICD-10-CM

## 2019-12-17 DIAGNOSIS — F411 Generalized anxiety disorder: Secondary | ICD-10-CM

## 2019-12-17 DIAGNOSIS — G479 Sleep disorder, unspecified: Secondary | ICD-10-CM | POA: Diagnosis not present

## 2019-12-17 DIAGNOSIS — M26623 Arthralgia of bilateral temporomandibular joint: Secondary | ICD-10-CM

## 2019-12-17 DIAGNOSIS — R69 Illness, unspecified: Secondary | ICD-10-CM | POA: Diagnosis not present

## 2019-12-17 LAB — URINALYSIS, COMPLETE
Bilirubin, UA: NEGATIVE
Glucose, UA: NEGATIVE
Ketones, UA: NEGATIVE
Nitrite, UA: NEGATIVE
Protein,UA: NEGATIVE
RBC, UA: NEGATIVE
Specific Gravity, UA: <1.005 — ABNORMAL LOW (ref 1.005–1.030)
Urobilinogen, Ur: 0.2 mg/dL (ref 0.2–1.0)
pH, UA: 6.5 (ref 5.0–7.5)

## 2019-12-17 LAB — MICROSCOPIC EXAMINATION
Bacteria, UA: NONE SEEN
Epithelial Cells (non renal): NONE SEEN /hpf (ref 0–10)
RBC, Urine: NONE SEEN /hpf (ref 0–2)
WBC, UA: NONE SEEN /hpf (ref 0–5)

## 2019-12-17 MED ORDER — MIRTAZAPINE 7.5 MG PO TABS: 7.5000 mg | ORAL_TABLET | Freq: Every day | ORAL | 2 refills | Status: DC

## 2019-12-17 NOTE — Patient Instructions (Signed)
Anxiety - start Remeron 7.5 mg at bedtime  TMJ - Avil, increase tizanidine frequency or amount as ordered instead of only taking 2 mg once daily. Hopefully decreasing anxiety will decrease your clenching of the teeth at night.  Urine is being sent off for culture.   Change allergy medication to either Claritin, Zyrtec, or Xyzal.

## 2019-12-17 NOTE — Progress Notes (Signed)
Assessment & Plan:  1. Bilateral temporomandibular joint pain - Patient encouraged to try increasing the frequency or amount of tizanidine as her prescription is written to help with TMJ pain. Discussed she can take Advil as needed for pain and that hopefully decreasing some of her anxiety and helping her sleep will decrease the clenching of her teeth at night which is increasing her pain.   2-3. Anxiety state/Difficulty sleeping - Patient advised not to take any more lorazepam and to start mirtazapine at bedtime.  - mirtazapine (REMERON) 7.5 MG tablet; Take 1 tablet (7.5 mg total) by mouth at bedtime.  Dispense: 30 tablet; Refill: 2  4. Dysuria - Urinalysis, Complete - Urine dipstick shows positive for leukocytes.  Micro exam: negative for WBC's or RBC's and no bacteria. - Urine Culture  5. Seasonal allergies - Discussed that steroids are not appropriate treatment for her allergies, but since she has been taking Allegra for a long time, she could switch to either Claritin, Zyrtec, or Xyzal.    Follow up plan: Return 4-6 weeks, for f/u of anxiety/sleep with PCP.  Hendricks Limes, MSN, APRN, FNP-C [facility]  Subjective:   Patient ID: Kristy Garza, female    DOB: Aug 25, 1950, 69 y.o.   MRN: [id number]  HPI: Kristy Garza is a 69 y.o. female presenting on 12/17/2019 for Temporomandibular Joint Pain (Patient states that it is ongoing but worse last night.), Anxiety (Worse since mom got dx with cancer), and Dysuria (x 1 day)  Patient reports TMJ pain that has been going on, but was worse last night. She does wear a mouth guard nightly. She has prescriptions for Tramadol and tizanidine that she may use for TMJ pain. She reports she only takes 1/2 a Tramadol occasionally and one 2 mg tizanidine once daily, despite her prescription being for 1-2 capsules TID PRN.   She also reports her anxiety is worse recently and causes her to have trouble sleeping. Her mother was  diagnosed with cancer ~5 years ago and is still living at home alone. Her mother does not like to accept help and will not let any of her children move in with her. Patient reports she took half a lorazepam to try to help her sleep x2 nights, but that it made her too drowsy for days so she does not want to take it again.  Patient would like her urine checked due to dysuria x1 day. She reports a h/o interstitial cystitis and just wants to make sure she doesn't have an infection.   She is requesting steroids for allergies as she does not feel the Delma Freeze is working much for her anymore. She states she has been taking it for many years.    ROS: Negative unless specifically indicated above in HPI.   Relevant past medical history reviewed and updated as indicated.   Allergies and medications reviewed and updated.   Current Outpatient Medications:  .  acetaminophen (TYLENOL) 500 MG tablet, Take by mouth., Disp: , Rfl:  .  albuterol (VENTOLIN HFA) 108 (90 Base) MCG/ACT inhaler, Inhale 2 puffs into the lungs every 4 (four) hours as needed for wheezing or shortness of breath., Disp: 18 g, Rfl: 1 .  azelastine (ASTELIN) 0.1 % nasal spray, Place 2 sprays into both nostrils 2 (two) times daily. Use in each nostril as directed, Disp: 30 mL, Rfl: 2 .  b complex vitamins capsule, Take 1 capsule by mouth daily., Disp: , Rfl:  .  dextromethorphan-guaiFENesin ([place] DM)  30-600 MG 12hr tablet, Take 1 tablet by mouth 2 (two) times daily., Disp: , Rfl:  .  Fluticasone Furoate (ARNUITY ELLIPTA) 200 MCG/ACT AEPB, TAKE 1 PUFF BY MOUTH EVERY DAY, Disp: 30 each, Rfl: 5 .  hyoscyamine (LEVSIN SL) 0.125 MG SL tablet, PLEASE SEE ATTACHED FOR DETAILED DIRECTIONS, Disp: , Rfl:  .  lidocaine (XYLOCAINE) 2 % jelly, APPLY TOPICALLY TO AFFECTED AREA EVERY DAY AS NEEDED (use sparingly), Disp: 85 g, Rfl: 0 .  mometasone (NASONEX) 50 MCG/ACT nasal spray, PLACE 2 SPRAYS INTO THE NOSE DAILY., Disp: 17 g, Rfl: 5 .  pantoprazole  (PROTONIX) 40 MG tablet, Take 1 tablet (40 mg total) by mouth daily., Disp: 30 tablet, Rfl: 5 .  phenylephrine (SUDAFED PE) 10 MG TABS tablet, Take 10 mg by mouth every 4 (four) hours as needed., Disp: , Rfl:  .  Plecanatide (TRULANCE) 3 MG TABS, Take 1 tablet by mouth daily., Disp: 30 tablet, Rfl: 0 .  Polyethylene Glycol 3350 (MIRALAX PO), Take by mouth as needed., Disp: , Rfl:  .  Prasterone, DHEA, 25 MG CAPS, Take 2 mg by mouth daily., Disp: , Rfl:  .  Probiotic Product (PROBIOTIC PO), Take by mouth daily., Disp: , Rfl:  .  Simethicone (GAS-X PO), Take by mouth., Disp: , Rfl:  .  tizanidine (ZANAFLEX) 2 MG capsule, Take 1-2 capsules (2-4 mg total) by mouth 3 (three) times daily as needed for muscle spasms., Disp: 60 capsule, Rfl: 2 .  traMADol (ULTRAM) 50 MG tablet, Take 1 tablet (50 mg total) by mouth every 12 (twelve) hours as needed., Disp: 30 tablet, Rfl: 1 .  UNABLE TO FIND, Take 1 capsule by mouth daily. Dv3- vitamin D 3 plus immune support, Disp: , Rfl:   Current Facility-Administered Medications:  .  zoledronic acid (RECLAST) injection 5 mg, 5 mg, Intravenous, Once, Gottschalk, Ashly M, DO  Allergies  Allergen Reactions  . Cefuroxime Axetil Other (See Comments)    Extreme gas  . Gabapentin Other (See Comments)    Constipation Constipation  . Montelukast Other (See Comments)    Numbness and tingling in hand. Numbness and tingling in hand. Numbness and tingling in hand. NUMBNESS OF HANDS AND FEET. Numbness in hands and feet NUMBNESS OF HANDS AND FEET.  . Pregabalin Other (See Comments)    Drowsiness, dry mouth Drowsiness, dry mouth Makes her tired and thirsty  . Augmentin [Amoxicillin-Pot Clavulanate] Diarrhea  . Avelox [Moxifloxacin Hcl In Nacl] Other (See Comments)    Tremors   . Biaxin [Clarithromycin]     Unsure of reaction  . Cedax [Ceftibuten] Other (See Comments)    tremors  . Ciprofloxacin Other (See Comments) and Nausea And Vomiting    Blurred  vision Blurred vision Pt can't remember reaction  . Clindamycin/Lincomycin     tachycardia  . Levofloxacin Other (See Comments)    Feels dehydrated  . Lincomycin Other (See Comments)    tachycardia  . Montelukast Sodium Other (See Comments)    Numbness and tingling in hand. Numbness and tingling in hand.  . Sulfonamide Derivatives Other (See Comments)    Gi upset    Objective:   BP 138/82   Pulse 85   Temp 98.3 F (36.8 C) (Temporal)   Ht 5\' 8"  (1.727 m)   Wt 110 lb (49.9 kg)   SpO2 100%   BMI 16.73 kg/m    Physical Exam Vitals reviewed.  Constitutional:      General: She is not in acute distress.  Appearance: Normal appearance. She is underweight. She is not ill-appearing, toxic-appearing or diaphoretic.  HENT:     Head: Normocephalic and atraumatic.  Eyes:     General: No scleral icterus.       Right eye: No discharge.        Left eye: No discharge.     Conjunctiva/sclera: Conjunctivae normal.  Cardiovascular:     Rate and Rhythm: Normal rate and regular rhythm.     Heart sounds: Normal heart sounds. No murmur heard.  No friction rub. No gallop.   Pulmonary:     Effort: Pulmonary effort is normal. No respiratory distress.     Breath sounds: Normal breath sounds. No stridor. No wheezing, rhonchi or rales.  Musculoskeletal:        General: Normal range of motion.     Cervical back: Normal range of motion.  Skin:    General: Skin is warm and dry.     Capillary Refill: Capillary refill takes less than 2 seconds.  Neurological:     General: No focal deficit present.     Mental Status: She is alert and oriented to person, place, and time. Mental status is at baseline.  Psychiatric:        Mood and Affect: Mood normal.        Behavior: Behavior normal.        Thought Content: Thought content normal.        Judgment: Judgment normal.

## 2019-12-18 ENCOUNTER — Ambulatory Visit: Payer: Medicare HMO

## 2019-12-19 NOTE — Patient Instructions (Addendum)
Allergic rhinitis Continue Allegra once a day as needed for a runy nose. Continue Nasonex 2 sprays in each nostril once a day as needed for stuffy nose Continue saline rinses at least once a day Continue azelastine 2 sprays in each nostril twice a day as needed for nasal symptoms Return to the clinic for environmental allergy skin testing when it is convenient for you. Remember to stop antihistamines for 3 days before the testing day May use saline nasal rinse as needed for nasal symptoms. Use this prior to any medicated nasal sprays.  Asthma  Continue Arnuity Ellipta 1 puff every day.  Use this every day and make sure to rinse your mouth out after. Continue albuterol 2 puffs every 4 hours as needed for cough, wheeze, tightness in chest, or shortness of breath Asthma control goals:   Full participation in all desired activities (may need albuterol before activity)  Albuterol use two time or less a week on average (not counting use with activity)  Cough interfering with sleep two time or less a month  Oral steroids no more than once a year  No hospitalizations  Reflux Continue dietary and lifestyle modifications as listed below Continue pantoprazole   I would recommend getting tested for COVID Speak with your primary care physician about burning of tongue  Please let us know if this treatment plan is not working well for you.  Schedule follow-up appointment in 3 months

## 2019-12-20 ENCOUNTER — Other Ambulatory Visit: Payer: Self-pay

## 2019-12-20 ENCOUNTER — Ambulatory Visit (INDEPENDENT_AMBULATORY_CARE_PROVIDER_SITE_OTHER): Payer: Medicare HMO | Admitting: Family

## 2019-12-20 ENCOUNTER — Encounter: Payer: Self-pay | Admitting: Family

## 2019-12-20 ENCOUNTER — Ambulatory Visit: Payer: Medicare HMO | Admitting: Nurse Practitioner

## 2019-12-20 VITALS — BP 110/60 | HR 76 | Temp 98.2°F | Resp 18 | Ht 69.0 in | Wt 105.2 lb

## 2019-12-20 DIAGNOSIS — J029 Acute pharyngitis, unspecified: Secondary | ICD-10-CM | POA: Diagnosis not present

## 2019-12-20 DIAGNOSIS — J310 Chronic rhinitis: Secondary | ICD-10-CM | POA: Diagnosis not present

## 2019-12-20 DIAGNOSIS — K219 Gastro-esophageal reflux disease without esophagitis: Secondary | ICD-10-CM | POA: Diagnosis not present

## 2019-12-20 DIAGNOSIS — J4530 Mild persistent asthma, uncomplicated: Secondary | ICD-10-CM

## 2019-12-20 DIAGNOSIS — R059 Cough, unspecified: Secondary | ICD-10-CM

## 2019-12-20 DIAGNOSIS — Z20822 Contact with and (suspected) exposure to covid-19: Secondary | ICD-10-CM | POA: Diagnosis not present

## 2019-12-20 DIAGNOSIS — R05 Cough: Secondary | ICD-10-CM

## 2019-12-20 DIAGNOSIS — R0981 Nasal congestion: Secondary | ICD-10-CM | POA: Diagnosis not present

## 2019-12-20 LAB — URINE CULTURE

## 2019-12-20 MED ORDER — ALBUTEROL SULFATE HFA 108 (90 BASE) MCG/ACT IN AERS
INHALATION_SPRAY | RESPIRATORY_TRACT | 1 refills | Status: DC
Start: 2019-12-20 — End: 2021-01-04

## 2019-12-20 MED ORDER — AZELASTINE HCL 0.1 % NA SOLN
2.0000 | Freq: Two times a day (BID) | NASAL | 5 refills | Status: DC
Start: 2019-12-20 — End: 2020-08-20

## 2019-12-20 NOTE — Progress Notes (Signed)
[place], [place] [id number] Dept: [phone]  FOLLOW UP NOTE  Patient ID: [name], female    DOB: [birth year]  Age: 69 y.o. MRN: [id number] Date of Office Visit: 12/20/2019  Assessment  Chief Complaint: Allergic Rhinitis , Cough, and Asthma  HPI Kristy Garza is a 69 year old female who presents today for an acute visit.  She was last seen on Aug 02, 2019 by Gareth Morgan, FNP for mild persistent asthma without complication, acute nonrecurrent maxillary sinusitis, chronic rhinitis, gastroesophageal reflux disease, and cough.  She reports that since the beginning of this week she has had a cough that is dry, but one time she did cough up green sputum, a headache, watery eyes, clear rhinorrhea, burning tongue, and nasal congestion.  She denies any fevers chills or body aches.  She does not know of any known sick contacts.  She has not been vaccinated for COVID-19.  She recently started back her Arnuity 1 puff once a day on Monday when she started coughing.  She denies any wheezing, tightness in her chest, shortness of breath, no awakenings due to asthma.  Yesterday she used her albuterol twice, but has been usually using her albuterol approximately 2 times a week.  Allergic rhinitis is reported as not well controlled with Allegra once a day, Nasonex 2 sprays each nostril once a day, and azelastine 2 sprays each nostril twice a day.  She did not ever switch to cetirizine due to it making her drowsy.  She also reports that she has tried Xyzal and Claritin in the past and they do not help her symptoms.  She reports clear rhinorrhea, nasal congestion, postnasal drip and watery eyes.  While in the room she reported that her left eye became itchy.  Her reflux is reported as controlled with pantoprazole once a day.  Current medications are as listed in the chart.    Drug Allergies:  Allergies  Allergen Reactions  . Cefuroxime Axetil Other (See Comments)    Extreme  gas  . Gabapentin Other (See Comments)    Constipation Constipation  . Montelukast Other (See Comments)    Numbness and tingling in hand. Numbness and tingling in hand. Numbness and tingling in hand. NUMBNESS OF HANDS AND FEET. Numbness in hands and feet NUMBNESS OF HANDS AND FEET.  . Pregabalin Other (See Comments)    Drowsiness, dry mouth Drowsiness, dry mouth Makes her tired and thirsty  . Augmentin [Amoxicillin-Pot Clavulanate] Diarrhea  . Avelox [Moxifloxacin Hcl In Nacl] Other (See Comments)    Tremors   . Biaxin [Clarithromycin]     Unsure of reaction  . Cedax [Ceftibuten] Other (See Comments)    tremors  . Ciprofloxacin Other (See Comments) and Nausea And Vomiting    Blurred vision Blurred vision Pt can't remember reaction  . Clindamycin/Lincomycin     tachycardia  . Levofloxacin Other (See Comments)    Feels dehydrated  . Lincomycin Other (See Comments)    tachycardia  . Montelukast Sodium Other (See Comments)    Numbness and tingling in hand. Numbness and tingling in hand.  . Sulfonamide Derivatives Other (See Comments)    Gi upset    Review of Systems: Review of Systems  Constitutional: Negative for chills and fever.  HENT:       Reports clear rhinorrhea, post nasal drip and nasal congestion  Eyes:       Reports watery eyes  Respiratory: Positive for cough. Negative for shortness of breath and wheezing.  Cardiovascular: Negative for chest pain and palpitations.  Gastrointestinal: Negative for abdominal pain and heartburn.  Genitourinary: Negative for dysuria.  Musculoskeletal: Negative for myalgias.  Skin: Negative for itching and rash.  Neurological: Positive for headaches.  Endo/Heme/Allergies: Positive for environmental allergies.    Physical Exam: BP 110/60   Pulse 76   Temp 98.2 F (36.8 C) (Temporal)   Resp 18   Ht 5\' 9"  (1.753 m)   Wt 105 lb 3.2 oz (47.7 kg)   SpO2 98%   BMI 15.54 kg/m    Physical Exam Constitutional:       Appearance: Normal appearance.  HENT:     Head: Normocephalic and atraumatic.     Comments: Pharynx normal. Tongue normal. Eyes normal. Left ear normal. Unable to visualize right tympanic membrane due to cerumen. Nose: thick white drainage noted in bilateral nares    Right Ear: Ear canal and external ear normal.     Left Ear: Tympanic membrane, ear canal and external ear normal.     Mouth/Throat:     Mouth: Mucous membranes are moist.     Pharynx: Oropharynx is clear.  Eyes:     Conjunctiva/sclera: Conjunctivae normal.  Cardiovascular:     Rate and Rhythm: Normal rate and regular rhythm.     Heart sounds: Normal heart sounds.  Pulmonary:     Effort: Pulmonary effort is normal.     Breath sounds: Normal breath sounds.     Comments: Lungs clear to auscultation Musculoskeletal:     Cervical back: Neck supple.  Skin:    General: Skin is warm.  Neurological:     Mental Status: She is alert and oriented to person, place, and time.  Psychiatric:        Mood and Affect: Mood normal.        Behavior: Behavior normal.        Thought Content: Thought content normal.        Judgment: Judgment normal.     Diagnostics:  Not done today due to COVID symptoms  Assessment and Plan: 1. Mild persistent asthma without complication   2. Chronic rhinitis   3. Gastroesophageal reflux disease, unspecified whether esophagitis present   4. Cough     No orders of the defined types were placed in this encounter.   Patient Instructions  Allergic rhinitis Continue Allegra once a day as needed for a runy nose. Continue Nasonex 2 sprays in each nostril once a day as needed for stuffy nose Continue saline rinses at least once a day Continue azelastine 2 sprays in each nostril twice a day as needed for nasal symptoms Return to the clinic for environmental allergy skin testing when it is convenient for you. Remember to stop antihistamines for 3 days before the testing day May use saline nasal rinse  as needed for nasal symptoms. Use this prior to any medicated nasal sprays.  Asthma  Continue Arnuity Ellipta 1 puff every day.  Use this every day and make sure to rinse your mouth out after. Continue albuterol 2 puffs every 4 hours as needed for cough, wheeze, tightness in chest, or shortness of breath Asthma control goals:   Full participation in all desired activities (may need albuterol before activity)  Albuterol use two time or less a week on average (not counting use with activity)  Cough interfering with sleep two time or less a month  Oral steroids no more than once a year  No hospitalizations  Reflux Continue dietary and lifestyle modifications  as listed below Continue pantoprazole   I would recommend getting tested for COVID Speak with your primary care physician about burning of tongue  Please let us know if this treatment plan is not working well for you.  Schedule follow-up appointment in 3 months     Return in about 3 months (around 03/20/2020), or if symptoms worsen or fail to improve.    Thank you for the opportunity to care for this patient.  Please do not hesitate to contact me with questions.  Althea Charon, FNP [facility]

## 2019-12-23 ENCOUNTER — Encounter: Payer: Self-pay | Admitting: Family Medicine

## 2019-12-23 ENCOUNTER — Telehealth: Payer: Medicare HMO

## 2019-12-24 ENCOUNTER — Telehealth: Payer: Self-pay | Admitting: Family Medicine

## 2019-12-24 DIAGNOSIS — R102 Pelvic and perineal pain: Secondary | ICD-10-CM | POA: Diagnosis not present

## 2019-12-24 DIAGNOSIS — R300 Dysuria: Secondary | ICD-10-CM | POA: Diagnosis not present

## 2019-12-24 NOTE — Telephone Encounter (Signed)
See result note.  

## 2019-12-24 NOTE — Progress Notes (Signed)
Attempted to contact - NA 

## 2019-12-26 ENCOUNTER — Telehealth: Payer: Self-pay | Admitting: Family Medicine

## 2019-12-26 ENCOUNTER — Other Ambulatory Visit: Payer: Self-pay | Admitting: Family Medicine

## 2019-12-26 DIAGNOSIS — N3000 Acute cystitis without hematuria: Secondary | ICD-10-CM

## 2019-12-26 MED ORDER — DOXYCYCLINE HYCLATE 100 MG PO TABS: 100.0000 mg | ORAL_TABLET | Freq: Two times a day (BID) | ORAL | 0 refills | Status: AC

## 2019-12-26 NOTE — Telephone Encounter (Signed)
Pt returned missed call regarding lab results. Reviewed results with pt. Pt voiced understanding but said she would really like to come in and be retested because she was asleep last night and had a bowel movement in her sleep that was not good. Says she doesn't mind taking an antibiotic but does not want to take doxycycline. Wants to know what other antibiotic can be prescribed to her. Wants to be called back.

## 2019-12-27 DIAGNOSIS — R102 Pelvic and perineal pain: Secondary | ICD-10-CM | POA: Diagnosis not present

## 2019-12-27 NOTE — Telephone Encounter (Signed)
Left message to call back  

## 2019-12-27 NOTE — Telephone Encounter (Signed)
I have not been seeing pt for this issue please cc to appropriate provider or have her schedule a visit with me

## 2019-12-27 NOTE — Telephone Encounter (Signed)
Spoke with patient and advised her that Macrobid was the only other antibiotic that could be prescribed and this was not normally prescribed to anyone over age 69.  The patient reports that she went to her urologist today and her urine was checked and found to be clear of infection.  They think she has cystitis.  Patient states she does not want an antibiotic and she does not need anything from [name] at this time.

## 2019-12-27 NOTE — Telephone Encounter (Signed)
Please review, message and labs.[name] KitchenMarland KitchenMarland Kitchen

## 2019-12-27 NOTE — Telephone Encounter (Signed)
Given her list of allergies, the only other option I have is Macrobid which we try not to give to patients over the age of 7. If she is insistent, I can send it.

## 2020-01-01 ENCOUNTER — Ambulatory Visit (INDEPENDENT_AMBULATORY_CARE_PROVIDER_SITE_OTHER): Payer: Medicare Other

## 2020-01-01 ENCOUNTER — Other Ambulatory Visit: Payer: Self-pay

## 2020-01-01 ENCOUNTER — Telehealth: Payer: Self-pay | Admitting: *Deleted

## 2020-01-01 DIAGNOSIS — Z23 Encounter for immunization: Secondary | ICD-10-CM

## 2020-01-01 NOTE — Telephone Encounter (Signed)
Was this the lady you were helping me with before?  I ordered it in August but apparently, she hasn't been contacted.

## 2020-01-01 NOTE — Telephone Encounter (Signed)
Please go over the COVID-19 vaccine testing algorithm with the patient and depending on the results we can either do further evaluation and testing or she can get the [place] vaccine in one of our upcoming clinics if she would feel better about this. Thank you!

## 2020-01-01 NOTE — Telephone Encounter (Signed)
Left a message for pt to return call. If pt has already gotten the vaccine, how did she do? If she has not gotten it then I will ask questions and offer vaccine in clinic.

## 2020-01-01 NOTE — Telephone Encounter (Signed)
Patient in office for COVID vaccine.  Patient states that she is suppose to be starting Reclast and would like to know if this is still being worked on?

## 2020-01-01 NOTE — Progress Notes (Signed)
   Covid-19 Vaccination Clinic  Name:  Kristy Garza    MRN: [id number] DOB: 28-Jul-1950  01/01/2020  Kristy Garza was observed post Covid-19 immunization for 30 minutes based on pre-vaccination screening without incident. She was provided with Vaccine Information Sheet and instruction to access the V-Safe system.   Kristy Garza was instructed to call 911 with any severe reactions post vaccine: [name] Difficulty breathing  . Swelling of face and throat  . A fast heartbeat  . A bad rash all over body  . Dizziness and weakness   Immunizations Administered    Name Date Dose VIS Date Route   Pfizer COVID-19 Vaccine 01/01/2020  2:52 PM 0.3 mL 05/22/2018 Intramuscular   Manufacturer: [place]   Lot: [id number]   [place]: [id number]

## 2020-01-01 NOTE — Telephone Encounter (Signed)
Pt called and would like the doctors option on whether or not she should get the pfizer vaccine. Please advise.

## 2020-01-08 NOTE — Telephone Encounter (Signed)
Form completed and given to Dr. Darnell Level to sign.

## 2020-01-09 ENCOUNTER — Telehealth: Payer: Self-pay | Admitting: *Deleted

## 2020-01-09 ENCOUNTER — Telehealth: Payer: Medicare HMO | Admitting: *Deleted

## 2020-01-09 NOTE — Telephone Encounter (Signed)
Left a message for patient to call the office in regards to this matter. 

## 2020-01-09 NOTE — Telephone Encounter (Signed)
  Chronic Care Management   Outreach Note  01/09/2020 Name: Kristy Garza MRN: [id number] DOB: [birth year]  Referred by: [name], DO Reason for referral : Chronic Care Management (RN follow up)   An unsuccessful telephone follow-up was attempted today. The patient was referred to the case management team for assistance with care management and care coordination.   Follow Up Plan: The care management team will reach out to the patient again over the next 30 days.   Chong Sicilian, BSN, RN-BC Embedded Chronic Care Manager [facility] [place] [facility] Direct Dial: [phone]

## 2020-01-14 DIAGNOSIS — K5730 Diverticulosis of large intestine without perforation or abscess without bleeding: Secondary | ICD-10-CM | POA: Diagnosis not present

## 2020-01-14 DIAGNOSIS — Z86010 Personal history of colonic polyps: Secondary | ICD-10-CM | POA: Diagnosis not present

## 2020-01-14 DIAGNOSIS — R159 Full incontinence of feces: Secondary | ICD-10-CM | POA: Diagnosis not present

## 2020-01-14 DIAGNOSIS — K5904 Chronic idiopathic constipation: Secondary | ICD-10-CM | POA: Diagnosis not present

## 2020-01-15 NOTE — Telephone Encounter (Signed)
Patient received vaccine 01/01/2020, left a message to see how she did.

## 2020-01-16 NOTE — Telephone Encounter (Signed)
Pt has been r/s  

## 2020-01-17 ENCOUNTER — Encounter (HOSPITAL_COMMUNITY): Admission: RE | Admit: 2020-01-17 | Payer: Medicare HMO | Source: Ambulatory Visit

## 2020-01-27 ENCOUNTER — Telehealth: Payer: Self-pay

## 2020-01-27 NOTE — Telephone Encounter (Signed)
Pt called stating that she is supposed to have a Reclast done at [facility] on 01/31/20 and is scheduled to have [place] shot on 02/05/20.[name] wants to know if those appts are too close together?  Please call patient and advise.

## 2020-01-27 NOTE — Telephone Encounter (Signed)
Pt called stating that she wants to cancel her appt at [facility] on 01/31/20 but has tried calling [facility] and no one can help her to get the appt cancelled. How can pt cancel that appt?  Please call patient back.

## 2020-01-27 NOTE — Telephone Encounter (Signed)
Discussed with patient  Will wait 2 weeks in between

## 2020-01-29 NOTE — Telephone Encounter (Signed)
I see a cancelled appt for IV infusion via the patient reminder system. It looks like she already cancelled

## 2020-01-30 ENCOUNTER — Telehealth: Payer: Medicare HMO

## 2020-01-31 ENCOUNTER — Encounter (HOSPITAL_COMMUNITY): Admission: RE | Admit: 2020-01-31 | Payer: Medicare HMO | Source: Ambulatory Visit

## 2020-02-05 ENCOUNTER — Other Ambulatory Visit: Payer: Self-pay

## 2020-02-05 ENCOUNTER — Encounter (HOSPITAL_COMMUNITY)
Admission: RE | Admit: 2020-02-05 | Discharge: 2020-02-05 | Disposition: A | Discharge status: Home / Self Care | Payer: Medicare HMO | Source: Ambulatory Visit | Attending: Family Medicine | Admitting: Family Medicine

## 2020-02-05 ENCOUNTER — Ambulatory Visit (INDEPENDENT_AMBULATORY_CARE_PROVIDER_SITE_OTHER): Payer: Medicare HMO

## 2020-02-05 DIAGNOSIS — Z23 Encounter for immunization: Secondary | ICD-10-CM | POA: Diagnosis not present

## 2020-02-05 NOTE — Progress Notes (Signed)
Patient given second pfizer vaccine and tolerated well.

## 2020-02-18 DIAGNOSIS — N3000 Acute cystitis without hematuria: Secondary | ICD-10-CM | POA: Diagnosis not present

## 2020-02-18 DIAGNOSIS — J00 Acute nasopharyngitis [common cold]: Secondary | ICD-10-CM | POA: Diagnosis not present

## 2020-02-18 DIAGNOSIS — R300 Dysuria: Secondary | ICD-10-CM | POA: Diagnosis not present

## 2020-02-24 ENCOUNTER — Telehealth: Payer: Medicare HMO | Admitting: *Deleted

## 2020-02-24 DIAGNOSIS — R300 Dysuria: Secondary | ICD-10-CM | POA: Diagnosis not present

## 2020-02-24 DIAGNOSIS — N3010 Interstitial cystitis (chronic) without hematuria: Secondary | ICD-10-CM | POA: Diagnosis not present

## 2020-02-24 DIAGNOSIS — R102 Pelvic and perineal pain: Secondary | ICD-10-CM | POA: Diagnosis not present

## 2020-03-02 ENCOUNTER — Telehealth: Payer: Medicare HMO

## 2020-03-04 DIAGNOSIS — K5904 Chronic idiopathic constipation: Secondary | ICD-10-CM | POA: Diagnosis not present

## 2020-03-04 DIAGNOSIS — R252 Cramp and spasm: Secondary | ICD-10-CM | POA: Diagnosis not present

## 2020-03-04 DIAGNOSIS — R140 Abdominal distension (gaseous): Secondary | ICD-10-CM | POA: Diagnosis not present

## 2020-03-13 DIAGNOSIS — R102 Pelvic and perineal pain: Secondary | ICD-10-CM | POA: Diagnosis not present

## 2020-03-18 DIAGNOSIS — R102 Pelvic and perineal pain: Secondary | ICD-10-CM | POA: Diagnosis not present

## 2020-03-18 DIAGNOSIS — H16223 Keratoconjunctivitis sicca, not specified as Sjogren's, bilateral: Secondary | ICD-10-CM | POA: Diagnosis not present

## 2020-03-24 DIAGNOSIS — N3010 Interstitial cystitis (chronic) without hematuria: Secondary | ICD-10-CM | POA: Diagnosis not present

## 2020-03-25 DIAGNOSIS — K2970 Gastritis, unspecified, without bleeding: Secondary | ICD-10-CM | POA: Diagnosis not present

## 2020-03-25 DIAGNOSIS — M810 Age-related osteoporosis without current pathological fracture: Secondary | ICD-10-CM | POA: Diagnosis not present

## 2020-03-25 DIAGNOSIS — E611 Iron deficiency: Secondary | ICD-10-CM | POA: Diagnosis not present

## 2020-03-25 DIAGNOSIS — R5381 Other malaise: Secondary | ICD-10-CM | POA: Diagnosis not present

## 2020-03-25 DIAGNOSIS — D509 Iron deficiency anemia, unspecified: Secondary | ICD-10-CM | POA: Diagnosis not present

## 2020-03-25 DIAGNOSIS — K219 Gastro-esophageal reflux disease without esophagitis: Secondary | ICD-10-CM | POA: Diagnosis not present

## 2020-03-25 DIAGNOSIS — R7989 Other specified abnormal findings of blood chemistry: Secondary | ICD-10-CM | POA: Diagnosis not present

## 2020-03-25 DIAGNOSIS — N959 Unspecified menopausal and perimenopausal disorder: Secondary | ICD-10-CM | POA: Diagnosis not present

## 2020-03-25 DIAGNOSIS — R519 Headache, unspecified: Secondary | ICD-10-CM | POA: Diagnosis not present

## 2020-03-25 DIAGNOSIS — J441 Chronic obstructive pulmonary disease with (acute) exacerbation: Secondary | ICD-10-CM | POA: Diagnosis not present

## 2020-03-25 DIAGNOSIS — E539 Vitamin B deficiency, unspecified: Secondary | ICD-10-CM | POA: Diagnosis not present

## 2020-03-25 DIAGNOSIS — K589 Irritable bowel syndrome without diarrhea: Secondary | ICD-10-CM | POA: Diagnosis not present

## 2020-03-31 ENCOUNTER — Ambulatory Visit: Payer: Medicare HMO | Admitting: Allergy and Immunology

## 2020-04-07 ENCOUNTER — Telehealth: Payer: Medicare HMO

## 2020-04-10 ENCOUNTER — Telehealth: Payer: Self-pay | Admitting: Allergy and Immunology

## 2020-04-10 ENCOUNTER — Encounter (HOSPITAL_COMMUNITY)
Admission: RE | Admit: 2020-04-10 | Discharge: 2020-04-10 | Disposition: A | Discharge status: Home / Self Care | Payer: Medicare HMO | Source: Ambulatory Visit | Attending: Family Medicine | Admitting: Family Medicine

## 2020-04-10 NOTE — Telephone Encounter (Signed)
Left message to return call 

## 2020-04-10 NOTE — Telephone Encounter (Signed)
Patient called to say she has sinus drainage, headache and sore throat. She doesn't think it is Covid. She takes Human resources officer but thinks she may need to switch. She had a referral to [facility] that she cancelled. She was going to call them back to try to schedule with them. [facility].

## 2020-04-10 NOTE — Telephone Encounter (Signed)
I recommend that Kristy Garza get checked for COVID-19. Is Kristy Garza using her azelastine nasal spray? This can help with sinus drainage. Kristy Garza would need to use the azelastine nasal spray 1-2 sprays each nostril twice a day as needed for runny nose or sinus drainage. Kristy Garza can also do sinus rinses to help with drainage. We also recommended at her last office visit that Kristy Garza schedule an appointment for skin testing to environmental inhalents. Kristy Garza will need to be off all antihistamines 3 days prior. Thank you!

## 2020-04-10 NOTE — Telephone Encounter (Signed)
Chrissy will you please advise? Thank You.

## 2020-04-15 DIAGNOSIS — K5730 Diverticulosis of large intestine without perforation or abscess without bleeding: Secondary | ICD-10-CM | POA: Diagnosis not present

## 2020-04-15 DIAGNOSIS — K5904 Chronic idiopathic constipation: Secondary | ICD-10-CM | POA: Diagnosis not present

## 2020-04-15 DIAGNOSIS — D509 Iron deficiency anemia, unspecified: Secondary | ICD-10-CM | POA: Diagnosis not present

## 2020-04-15 DIAGNOSIS — Z86010 Personal history of colonic polyps: Secondary | ICD-10-CM | POA: Diagnosis not present

## 2020-04-15 NOTE — Telephone Encounter (Signed)
Attempted to call no answer phone kept ringing for several minutes

## 2020-04-17 ENCOUNTER — Telehealth: Payer: Self-pay | Admitting: Family Medicine

## 2020-04-17 DIAGNOSIS — D509 Iron deficiency anemia, unspecified: Secondary | ICD-10-CM | POA: Insufficient documentation

## 2020-04-17 DIAGNOSIS — M2550 Pain in unspecified joint: Secondary | ICD-10-CM

## 2020-04-17 NOTE — Telephone Encounter (Signed)
REFERRAL REQUEST Telephone Note  Have you been seen at our office for this problem? Patient is being seen by Dr. Durward Fortes and she stated he would recommend a Referral to Rheumatology for her Rheumatoid Arthritis  but it would need to be placed by PCP Office. (Advise that they may need an appointment with their PCP before a referral can be done)  Reason for Referral: Rheumatoid Arthritis  Referral discussed with patient:  Best contact number of patient for referral team:    Has patient been seen by a specialist for this issue before:  Patient provider preference for referral: Dr. Soyla Murphy Patient location preference for referral: [facility]    Patient notified that referrals can take up to a week or longer to process. If they haven't heard anything within a week they should call back and speak with the referral department.

## 2020-04-17 NOTE — Telephone Encounter (Signed)
I spoke with patient and she is feeling better. I did explain the medication recommendations to her. I also explained that if she develops symptoms again to go get tested. I also let her know that just because she has the covid vaccines she can still get covid, it will just help with her symptom severity. She verbalized understanding of this.

## 2020-04-20 ENCOUNTER — Encounter (HOSPITAL_COMMUNITY): Admission: RE | Admit: 2020-04-20 | Payer: Medicare HMO | Source: Ambulatory Visit

## 2020-04-21 DIAGNOSIS — D509 Iron deficiency anemia, unspecified: Secondary | ICD-10-CM | POA: Diagnosis not present

## 2020-04-22 DIAGNOSIS — D509 Iron deficiency anemia, unspecified: Secondary | ICD-10-CM | POA: Diagnosis not present

## 2020-04-27 DIAGNOSIS — M79641 Pain in right hand: Secondary | ICD-10-CM | POA: Diagnosis not present

## 2020-04-27 DIAGNOSIS — M79671 Pain in right foot: Secondary | ICD-10-CM | POA: Diagnosis not present

## 2020-04-27 DIAGNOSIS — M533 Sacrococcygeal disorders, not elsewhere classified: Secondary | ICD-10-CM | POA: Diagnosis not present

## 2020-04-27 DIAGNOSIS — M19072 Primary osteoarthritis, left ankle and foot: Secondary | ICD-10-CM | POA: Diagnosis not present

## 2020-04-27 DIAGNOSIS — I7300 Raynaud's syndrome without gangrene: Secondary | ICD-10-CM | POA: Diagnosis not present

## 2020-04-27 DIAGNOSIS — M47812 Spondylosis without myelopathy or radiculopathy, cervical region: Secondary | ICD-10-CM | POA: Diagnosis not present

## 2020-04-27 DIAGNOSIS — M79672 Pain in left foot: Secondary | ICD-10-CM | POA: Diagnosis not present

## 2020-04-27 DIAGNOSIS — M19042 Primary osteoarthritis, left hand: Secondary | ICD-10-CM | POA: Diagnosis not present

## 2020-04-27 DIAGNOSIS — M810 Age-related osteoporosis without current pathological fracture: Secondary | ICD-10-CM | POA: Diagnosis not present

## 2020-04-27 DIAGNOSIS — M19032 Primary osteoarthritis, left wrist: Secondary | ICD-10-CM | POA: Diagnosis not present

## 2020-04-27 DIAGNOSIS — M19041 Primary osteoarthritis, right hand: Secondary | ICD-10-CM | POA: Diagnosis not present

## 2020-04-27 DIAGNOSIS — M79642 Pain in left hand: Secondary | ICD-10-CM | POA: Diagnosis not present

## 2020-04-27 DIAGNOSIS — M19071 Primary osteoarthritis, right ankle and foot: Secondary | ICD-10-CM | POA: Diagnosis not present

## 2020-04-27 DIAGNOSIS — J439 Emphysema, unspecified: Secondary | ICD-10-CM | POA: Diagnosis not present

## 2020-04-27 DIAGNOSIS — M47818 Spondylosis without myelopathy or radiculopathy, sacral and sacrococcygeal region: Secondary | ICD-10-CM | POA: Diagnosis not present

## 2020-04-27 DIAGNOSIS — M2550 Pain in unspecified joint: Secondary | ICD-10-CM | POA: Diagnosis not present

## 2020-04-27 DIAGNOSIS — M545 Low back pain, unspecified: Secondary | ICD-10-CM | POA: Diagnosis not present

## 2020-04-27 DIAGNOSIS — H04129 Dry eye syndrome of unspecified lacrimal gland: Secondary | ICD-10-CM | POA: Diagnosis not present

## 2020-05-06 ENCOUNTER — Encounter: Payer: Self-pay | Admitting: Nurse Practitioner

## 2020-05-06 ENCOUNTER — Ambulatory Visit (INDEPENDENT_AMBULATORY_CARE_PROVIDER_SITE_OTHER): Payer: Medicare HMO | Admitting: Nurse Practitioner

## 2020-05-06 DIAGNOSIS — K5909 Other constipation: Secondary | ICD-10-CM

## 2020-05-06 MED ORDER — MINERAL OIL RE ENEM: 1.0000 | ENEMA | Freq: Once | RECTAL | 0 refills | Status: AC

## 2020-05-06 NOTE — Assessment & Plan Note (Signed)
Constipation not well controlled, patient has tried using, laxatives, stool softeners with no therapeutic effects, advised patient to complete mag citrate from previous order. Mineral oil enema ordered.  Follow up with worsening or unresolved symptoms

## 2020-05-06 NOTE — Progress Notes (Signed)
   Virtual Visit via telephone Note Due to COVID-19 pandemic this visit was conducted virtually. This visit type was conducted due to national recommendations for restrictions regarding the COVID-19 Pandemic (e.g. social distancing, sheltering in place) in an effort to limit this patient's exposure and mitigate transmission in our community. All issues noted in this document were discussed and addressed.  A physical exam was not performed with this format.  I connected with Kristy Garza Kitchen on 05/06/20 at 2:50 PM  by telephone and verified that I am speaking with the correct person using two identifiers. Kristy Garza is currently located at home During visit. The provider, Ivy Lynn, NP is located in their office at time of visit.  I discussed the limitations, risks, security and privacy concerns of performing an evaluation and management service by telephone and the availability of in person appointments. I also discussed with the patient that there may be a patient responsible charge related to this service. The patient expressed understanding and agreed to proceed.   History and Present Illness:  Constipation This is a recurrent problem. The current episode started in the past 7 days. The problem is unchanged. The stool is described as loose and watery. There has been adequate water intake. Associated symptoms include bloating and diarrhea. Pertinent negatives include no abdominal pain, fecal incontinence, fever or vomiting. She has tried laxatives and stool softeners for the symptoms. The treatment provided mild relief. Her past medical history is significant for irritable bowel syndrome. There is no history of abdominal surgery.      Review of Systems  Constitutional: Negative for fever.  Gastrointestinal: Positive for bloating, constipation and diarrhea. Negative for abdominal pain and vomiting.     Observations/Objective: Televisit  Assessment and Plan: Other  constipation Constipation not well controlled, patient has tried using, laxatives, stool softeners with no therapeutic effects, advised patient to complete mag citrate from previous order. Mineral oil enema ordered.  Follow up with worsening or unresolved symptoms   Follow Up Instructions: Follow up with worsening or unresolved symptoms   I discussed the assessment and treatment plan with the patient. The patient was provided an opportunity to ask questions and all were answered. The patient agreed with the plan and demonstrated an understanding of the instructions.   The patient was advised to call back or seek an in-person evaluation if the symptoms worsen or if the condition fails to improve as anticipated.  The above assessment and management plan was discussed with the patient. The patient verbalized understanding of and has agreed to the management plan. Patient is aware to call the clinic if symptoms persist or worsen. Patient is aware when to return to the clinic for a follow-up visit. Patient educated on when it is appropriate to go to the emergency department.   Time call ended: 3:57 PM  I provided 7 minutes of non-face-to-face time during this encounter.    Ivy Lynn, NP

## 2020-05-08 ENCOUNTER — Encounter (HOSPITAL_COMMUNITY): Admission: RE | Admit: 2020-05-08 | Payer: Medicare HMO | Source: Ambulatory Visit

## 2020-05-11 ENCOUNTER — Telehealth: Payer: Medicare HMO

## 2020-05-11 DIAGNOSIS — M2550 Pain in unspecified joint: Secondary | ICD-10-CM | POA: Diagnosis not present

## 2020-05-11 DIAGNOSIS — D8989 Other specified disorders involving the immune mechanism, not elsewhere classified: Secondary | ICD-10-CM | POA: Diagnosis not present

## 2020-05-11 DIAGNOSIS — M1990 Unspecified osteoarthritis, unspecified site: Secondary | ICD-10-CM | POA: Diagnosis not present

## 2020-05-15 DIAGNOSIS — K5710 Diverticulosis of small intestine without perforation or abscess without bleeding: Secondary | ICD-10-CM | POA: Diagnosis not present

## 2020-05-15 DIAGNOSIS — K5730 Diverticulosis of large intestine without perforation or abscess without bleeding: Secondary | ICD-10-CM | POA: Diagnosis not present

## 2020-05-15 DIAGNOSIS — K3189 Other diseases of stomach and duodenum: Secondary | ICD-10-CM | POA: Diagnosis not present

## 2020-05-15 DIAGNOSIS — D509 Iron deficiency anemia, unspecified: Secondary | ICD-10-CM | POA: Diagnosis not present

## 2020-05-22 ENCOUNTER — Ambulatory Visit (INDEPENDENT_AMBULATORY_CARE_PROVIDER_SITE_OTHER): Payer: Medicare HMO | Admitting: Family Medicine

## 2020-05-22 ENCOUNTER — Other Ambulatory Visit: Payer: Self-pay

## 2020-05-22 ENCOUNTER — Encounter: Payer: Self-pay | Admitting: Family Medicine

## 2020-05-22 VITALS — BP 113/73 | HR 66 | Temp 97.8°F | Ht 69.0 in | Wt 110.5 lb

## 2020-05-22 DIAGNOSIS — R140 Abdominal distension (gaseous): Secondary | ICD-10-CM | POA: Diagnosis not present

## 2020-05-22 DIAGNOSIS — K5909 Other constipation: Secondary | ICD-10-CM

## 2020-05-22 NOTE — Patient Instructions (Signed)
Low-FODMAP Eating Plan  FODMAP stands for fermentable oligosaccharides, disaccharides, monosaccharides, and polyols. These are sugars that are hard for some people to digest. A low-FODMAP eating plan may help some people who have irritable bowel syndrome (IBS) and certain other bowel (intestinal) diseases to manage their symptoms. This meal plan can be complicated to follow. Work with a diet and nutrition specialist (dietitian) to make a low-FODMAP eating plan that is right for you. A dietitian can help make sure that you get enough nutrition from this diet. What are tips for following this plan? Reading food labels  Check labels for hidden FODMAPs such as: ? High-fructose syrup. ? Honey. ? Agave. ? Natural fruit flavors. ? Onion or garlic powder.  Choose low-FODMAP foods that contain 3-4 grams of fiber per serving.  Check food labels for serving sizes. Eat only one serving at a time to make sure FODMAP levels stay low. Shopping  Shop with a list of foods that are recommended on this diet and make a meal plan. Meal planning  Follow a low-FODMAP eating plan for up to 6 weeks, or as told by your health care provider or dietitian.  To follow the eating plan: 1. Eliminate high-FODMAP foods from your diet completely. Choose only low-FODMAP foods to eat. You will do this for 2-6 weeks. 2. Gradually reintroduce high-FODMAP foods into your diet one at a time. Most people should wait a few days before introducing the next new high-FODMAP food into their meal plan. Your dietitian can recommend how quickly you may reintroduce foods. 3. Keep a daily record of what and how much you eat and drink. Make note of any symptoms that you have after eating. 4. Review your daily record with a dietitian regularly to identify which foods you can eat and which foods you should avoid. General tips  Drink enough fluid each day to keep your urine pale yellow.  Avoid processed foods. These often have added sugar  and may be high in FODMAPs.  Avoid most dairy products, whole grains, and sweeteners.  Work with a dietitian to make sure you get enough fiber in your diet.  Avoid high FODMAP foods at meals to manage symptoms. Recommended foods Fruits Bananas, oranges, tangerines, lemons, limes, blueberries, raspberries, strawberries, grapes, cantaloupe, honeydew melon, kiwi, papaya, passion fruit, and pineapple. Limited amounts of dried cranberries, banana chips, and shredded coconut. Vegetables Eggplant, zucchini, cucumber, peppers, green beans, bean sprouts, lettuce, arugula, kale, Swiss chard, spinach, collard greens, bok choy, summer squash, potato, and tomato. Limited amounts of corn, carrot, and sweet potato. Green parts of scallions. Grains Gluten-free grains, such as rice, oats, buckwheat, quinoa, corn, polenta, and millet. Gluten-free pasta, bread, or cereal. Rice noodles. Corn tortillas. Meats and other proteins Unseasoned beef, pork, poultry, or fish. Eggs. Berniece Salines. Tofu (firm) and tempeh. Limited amounts of nuts and seeds, such as almonds, walnuts, [place] nuts, pecans, peanuts, nut butters, pumpkin seeds, chia seeds, and sunflower seeds. Dairy Lactose-free milk, yogurt, and kefir. Lactose-free cottage cheese and ice cream. Non-dairy milks, such as almond, coconut, hemp, and rice milk. Non-dairy yogurt. Limited amounts of goat cheese, brie, mozzarella, parmesan, swiss, and other hard cheeses. Fats and oils Butter-free spreads. Vegetable oils, such as olive, canola, and sunflower oil. Seasoning and other foods Artificial sweeteners with names that do not end in "ol," such as aspartame, saccharine, and stevia. Maple syrup, white table sugar, raw sugar, brown sugar, and molasses. Mayonnaise, soy sauce, and tamari. Fresh basil, coriander, parsley, rosemary, and thyme. Beverages Water and  mineral water. Sugar-sweetened soft drinks. Small amounts of orange juice or cranberry juice. Black and green tea.  Most dry wines. Coffee. The items listed above may not be a complete list of foods and beverages you can eat. Contact a dietitian for more information. Foods to avoid Fruits Fresh, dried, and juiced forms of apple, pear, watermelon, peach, plum, cherries, apricots, blackberries, boysenberries, figs, nectarines, and mango. Avocado. Vegetables Chicory root, artichoke, asparagus, cabbage, snow peas, Brussels sprouts, broccoli, sugar snap peas, mushrooms, celery, and cauliflower. Onions, garlic, leeks, and the white part of scallions. Grains Wheat, including kamut, durum, and semolina. Barley and bulgur. Couscous. Wheat-based cereals. Wheat noodles, bread, crackers, and pastries. Meats and other proteins Fried or fatty meat. Sausage. Cashews and pistachios. Soybeans, baked beans, black beans, chickpeas, kidney beans, fava beans, navy beans, lentils, black-eyed peas, and split peas. Dairy Milk, yogurt, ice cream, and soft cheese. Cream and sour cream. Milk-based sauces. Custard. Buttermilk. Soy milk. Seasoning and other foods Any sugar-free gum or candy. Foods that contain artificial sweeteners such as sorbitol, mannitol, isomalt, or xylitol. Foods that contain honey, high-fructose corn syrup, or agave. Bouillon, vegetable stock, beef stock, and chicken stock. Garlic and onion powder. Condiments made with onion, such as hummus, chutney, pickles, relish, salad dressing, and salsa. Tomato paste. Beverages Chicory-based drinks. Coffee substitutes. Chamomile tea. Fennel tea. Sweet or fortified wines such as port or sherry. Diet soft drinks made with isomalt, mannitol, maltitol, sorbitol, or xylitol. Apple, pear, and mango juice. Juices with high-fructose corn syrup. The items listed above may not be a complete list of foods and beverages you should avoid. Contact a dietitian for more information. Summary  FODMAP stands for fermentable oligosaccharides, disaccharides, monosaccharides, and polyols. These  are sugars that are hard for some people to digest.  A low-FODMAP eating plan is a short-term diet that helps to ease symptoms of certain bowel diseases.  The eating plan usually lasts up to 6 weeks. After that, high-FODMAP foods are reintroduced gradually and one at a time. This can help you find out which foods may be causing symptoms.  A low-FODMAP eating plan can be complicated. It is best to work with a dietitian who has experience with this type of plan. This information is not intended to replace advice given to you by your health care provider. Make sure you discuss any questions you have with your health care provider. Document Revised: 08/01/2019 Document Reviewed: 08/01/2019 Elsevier Patient Education  [place].

## 2020-05-22 NOTE — Progress Notes (Signed)
Established Patient Office Visit  Subjective:  Patient ID: Kristy Garza, female    DOB: Sep 02, 1950  Age: 70 y.o. MRN: [id number]  CC:  Chief Complaint  Patient presents with  . Constipation    HPI SPRUHA WEIGHT presents for constipation and bloating. This is a chronic problem for her. She had some prune juice, miralax, and a stool softener yesterday. She only had tomato juice with crackers yesterday. She had a loose bowel movement this morning. She sees a GI doctor for her chronic constipation. She was found to have a bacterial overgrowth and was prescribed xifaxin for 2 weeks. She was unable to pick this up due to the price. She denies nausea or vomiting. She is staying well hydrated. She has a follow up appointment with GI in a few weeks.   Past Medical History:  Diagnosis Date  . Acid reflux   . Allergy   . Arthritis    ARTHRITIS IN NECK BY DR. Alroy Dust [facility]  . Asthma   . Interstitial cystitis   . Osteoporosis   . PONV (postoperative nausea and vomiting)   . Tinnitus   . Trigeminal neuralgia    Atypical trigeminal neuralgia    Past Surgical History:  Procedure Laterality Date  . ABDOMINAL HYSTERECTOMY  2000  . CYSTOSCOPY WITH HYDRODISTENSION AND BIOPSY N/A 06/11/2012   Procedure: CYSTOSCOPY/BIOPSY/HYDRODISTENSION with Instillation of Pyridium and Marcaine and Kenalog;  Surgeon: Ailene Rud, MD;  Location: [facility];  Service: Urology;  Laterality: N/A;  1 hour requested for this case  BLADDER BIOPSY  . ETHMOIDECTOMY  2012  . SEPTOPLASTY  1980's  . vocal cord surgery   1990's   polyp removal    Family History  Problem Relation Age of Onset  . Hyperlipidemia Mother   . Arthritis Mother   . Cancer Mother   . Lymphoma Mother   . Cancer Father   . Allergies Father   . Allergies Sister   . Allergies Sister   . Allergic rhinitis Neg Hx   . Angioedema Neg Hx   . Asthma Neg Hx   . Eczema Neg Hx   . Immunodeficiency Neg Hx   .  Urticaria Neg Hx     Social History   Socioeconomic History  . Marital status: Married    Spouse name: [name]  . Number of children: 3  . Years of education: 34  . Highest education level: Some college, no degree  Occupational History  . Occupation: CNA    Comment: Retired  Tobacco Use  . Smoking status: Never Smoker  . Smokeless tobacco: Never Used  Vaping Use  . Vaping Use: Never used  Substance and Sexual Activity  . Alcohol use: No    Alcohol/week: 0.0 standard drinks    Comment: 01-19-2016 per pt no  . Drug use: No    Comment: 01-19-2016 per pt no   . Sexual activity: Not Currently    Birth control/protection: Surgical  Other Topics Concern  . Not on file  Social History Narrative   Lives with husband.    Social Determinants of Health   Financial Resource Strain: Low Risk   . Difficulty of Paying Living Expenses: Not hard at all  Food Insecurity: No Food Insecurity  . Worried About Charity fundraiser in the Last Year: Never true  . Ran Out of Food in the Last Year: Never true  Transportation Needs: No Transportation Needs  . Lack of Transportation (Medical): No  .  Lack of Transportation (Non-Medical): No  Physical Activity: Sufficiently Active  . Days of Exercise per Week: 5 days  . Minutes of Exercise per Session: 30 min  Stress: No Stress Concern Present  . Feeling of Stress : Only a little  Social Connections: Socially Integrated  . Frequency of Communication with Friends and Family: More than three times a week  . Frequency of Social Gatherings with Friends and Family: More than three times a week  . Attends Religious Services: More than 4 times per year  . Active Member of Clubs or Organizations: Yes  . Attends Archivist Meetings: More than 4 times per year  . Marital Status: Married  Human resources officer Violence: Not At Risk  . Fear of Current or Ex-Partner: No  . Emotionally Abused: No  . Physically Abused: No  . Sexually Abused: No     Outpatient Medications Prior to Visit  Medication Sig Dispense Refill  . acetaminophen (TYLENOL) 500 MG tablet Take by mouth.    [name] albuterol (VENTOLIN HFA) 108 (90 Base) MCG/ACT inhaler Use 2 puffs every 4 hours as needed for cough, wheeze, tightness in chest, or shortness of breath 18 g 1  . azelastine (ASTELIN) 0.1 % nasal spray Place 2 sprays into both nostrils 2 (two) times daily. Use in each nostril as directed 30 mL 5  . b complex vitamins capsule Take 1 capsule by mouth daily.    [name] dextromethorphan-guaiFENesin (MUCINEX DM) 30-600 MG 12hr tablet Take 1 tablet by mouth 2 (two) times daily.    . Fluticasone Furoate (ARNUITY ELLIPTA) 200 MCG/ACT AEPB TAKE 1 PUFF BY MOUTH EVERY DAY 30 each 5  . lidocaine (XYLOCAINE) 2 % jelly APPLY TOPICALLY TO AFFECTED AREA EVERY DAY AS NEEDED (use sparingly) 85 g 0  . mometasone (NASONEX) 50 MCG/ACT nasal spray PLACE 2 SPRAYS INTO THE NOSE DAILY. 17 g 5  . pantoprazole (PROTONIX) 40 MG tablet Take 1 tablet (40 mg total) by mouth daily. 30 tablet 5  . phenylephrine (SUDAFED PE) 10 MG TABS tablet Take 10 mg by mouth every 4 (four) hours as needed.    . Polyethylene Glycol 3350 (MIRALAX PO) Take by mouth as needed.    . Prasterone, DHEA, 25 MG CAPS Take 2 mg by mouth daily.    . Probiotic Product (PROBIOTIC PO) Take by mouth daily.    . Simethicone (GAS-X PO) Take by mouth.    . tizanidine (ZANAFLEX) 2 MG capsule Take 1-2 capsules (2-4 mg total) by mouth 3 (three) times daily as needed for muscle spasms. 60 capsule 2  . traMADol (ULTRAM) 50 MG tablet Take 1 tablet (50 mg total) by mouth every 12 (twelve) hours as needed. 30 tablet 1  . UNABLE TO FIND Take 1 capsule by mouth daily. Dv3- vitamin D 3 plus immune support    . hyoscyamine (LEVSIN SL) 0.125 MG SL tablet PLEASE SEE ATTACHED FOR DETAILED DIRECTIONS    . mirtazapine (REMERON) 7.5 MG tablet Take 1 tablet (7.5 mg total) by mouth at bedtime. 30 tablet 2  . Plecanatide (TRULANCE) 3 MG TABS Take 1  tablet by mouth daily. 30 tablet 0   Facility-Administered Medications Prior to Visit  Medication Dose Route Frequency Provider Last Rate Last Admin  . zoledronic acid (RECLAST) injection 5 mg  5 mg Intravenous Once Gottschalk, Ashly M, DO        Allergies  Allergen Reactions  . Cefuroxime Axetil Other (See Comments)    Extreme gas  . Gabapentin  Other (See Comments)    Constipation Constipation  . Montelukast Other (See Comments)    Numbness and tingling in hand. Numbness and tingling in hand. Numbness and tingling in hand. NUMBNESS OF HANDS AND FEET. Numbness in hands and feet NUMBNESS OF HANDS AND FEET.  . Pregabalin Other (See Comments)    Drowsiness, dry mouth Drowsiness, dry mouth Makes her tired and thirsty  . Augmentin [Amoxicillin-Pot Clavulanate] Diarrhea  . Avelox [Moxifloxacin Hcl In Nacl] Other (See Comments)    Tremors   . Biaxin [Clarithromycin]     Unsure of reaction  . Cedax [Ceftibuten] Other (See Comments)    tremors  . Ciprofloxacin Other (See Comments) and Nausea And Vomiting    Blurred vision Blurred vision Pt can't remember reaction  . Clindamycin/Lincomycin     tachycardia  . Levofloxacin Other (See Comments)    Feels dehydrated  . Lincomycin Other (See Comments)    tachycardia  . Montelukast Sodium Other (See Comments)    Numbness and tingling in hand. Numbness and tingling in hand.  . Sulfonamide Derivatives Other (See Comments)    Gi upset    ROS Review of Systems Negative unless specially indicated above in HPI.   Objective:    Physical Exam Vitals and nursing note reviewed.  Constitutional:      General: She is not in acute distress.    Appearance: Normal appearance. She is not ill-appearing, toxic-appearing or diaphoretic.  Cardiovascular:     Rate and Rhythm: Normal rate and regular rhythm.     Heart sounds: Normal heart sounds. No murmur heard.   Pulmonary:     Effort: Pulmonary effort is normal. No respiratory  distress.     Breath sounds: Normal breath sounds.  Abdominal:     General: Bowel sounds are normal. There is no distension.     Palpations: Abdomen is soft.     Tenderness: There is no abdominal tenderness. There is no guarding or rebound.  Musculoskeletal:     Right lower leg: No edema.     Left lower leg: No edema.  Skin:    General: Skin is warm and dry.  Neurological:     General: No focal deficit present.     Mental Status: She is alert and oriented to person, place, and time.  Psychiatric:        Mood and Affect: Mood normal.        Behavior: Behavior normal.     BP 113/73   Pulse 66   Temp 97.8 F (36.6 C) (Temporal)   Ht 5\' 9"  (1.753 m)   Wt 110 lb 8 oz (50.1 kg)   BMI 16.32 kg/m  Wt Readings from Last 3 Encounters:  05/22/20 110 lb 8 oz (50.1 kg)  12/20/19 105 lb 3.2 oz (47.7 kg)  12/17/19 110 lb (49.9 kg)     Health Maintenance Due  Topic Date Due  . MAMMOGRAM  12/21/2018  . INFLUENZA VACCINE  10/27/2019  . TETANUS/TDAP  05/21/2020    There are no preventive care reminders to display for this patient.  Lab Results  Component Value Date   TSH 0.467 11/18/2019   Lab Results  Component Value Date   WBC 4.4 11/18/2019   HGB 12.2 11/18/2019   HCT 36.1 11/18/2019   MCV 91 11/18/2019   PLT 218 11/18/2019   Lab Results  Component Value Date   NA 139 11/18/2019   K 4.3 11/18/2019   CO2 24 11/18/2019   GLUCOSE 93 11/18/2019  BUN 7 (L) 11/18/2019   CREATININE 0.74 11/18/2019   BILITOT 0.4 11/18/2019   ALKPHOS 86 11/18/2019   AST 37 11/18/2019   ALT 23 11/18/2019   PROT 6.9 11/18/2019   ALBUMIN 4.5 11/18/2019   CALCIUM 9.8 11/18/2019   GFR 93.08 03/25/2013   Lab Results  Component Value Date   CHOL 176 11/18/2019   Lab Results  Component Value Date   HDL 70 11/18/2019   Lab Results  Component Value Date   LDLCALC 96 11/18/2019   Lab Results  Component Value Date   TRIG 47 11/18/2019   Lab Results  Component Value Date    CHOLHDL 2.5 11/18/2019   No results found for: HGBA1C    Assessment & Plan:   Tryphena was seen today for constipation.  Diagnoses and all orders for this visit:  Chronic constipation/Abdominal bloating Unremarkable abdominal exam today. Samples of Xifaxan were given to patient today with instructions to take 1 tablet BID for 14 days. Low-FODMAP handout given. Follow up with GI as scheduled.    Follow-up: Return if symptoms worsen or fail to improve.   The patient indicates understanding of these issues and agrees with the plan.    Gwenlyn Perking, FNP

## 2020-05-27 ENCOUNTER — Ambulatory Visit (INDEPENDENT_AMBULATORY_CARE_PROVIDER_SITE_OTHER): Payer: Medicare HMO | Admitting: Nurse Practitioner

## 2020-05-27 ENCOUNTER — Ambulatory Visit (INDEPENDENT_AMBULATORY_CARE_PROVIDER_SITE_OTHER): Payer: Medicare HMO

## 2020-05-27 DIAGNOSIS — K580 Irritable bowel syndrome with diarrhea: Secondary | ICD-10-CM

## 2020-05-27 DIAGNOSIS — K5900 Constipation, unspecified: Secondary | ICD-10-CM | POA: Diagnosis not present

## 2020-05-27 DIAGNOSIS — K5909 Other constipation: Secondary | ICD-10-CM

## 2020-05-27 NOTE — Progress Notes (Signed)
   Virtual Visit via telephone Note Due to COVID-19 pandemic this visit was conducted virtually. This visit type was conducted due to national recommendations for restrictions regarding the COVID-19 Pandemic (e.g. social distancing, sheltering in place) in an effort to limit this patient's exposure and mitigate transmission in our community. All issues noted in this document were discussed and addressed.  A physical exam was not performed with this format.  I connected with Kristy Garza Kitchen on 05/27/20 at  2:56 PM by telephone and verified that I am speaking with the correct person using two identifiers. Kristy Garza is currently located at home  during visit. The provider, Ivy Lynn, NP is located in their office at time of visit.  I discussed the limitations, risks, security and privacy concerns of performing an evaluation and management service by telephone and the availability of in person appointments. I also discussed with the patient that there may be a patient responsible charge related to this service. The patient expressed understanding and agreed to proceed.   History and Present Illness:  HPI  Patient continues to present with unresolved constipation and bloating.  This is a chronic problem for patient she is being assessed over televisit and reports she has used prune juice, MiraLAX, and stool softener and still feels miserable and bloated.  Patient was seen on May 06, 2020, and May 22, 2020 for the same problem.  Patient was found to have a bacterial overgrowth and was prescribed Xifaxan for 2 weeks.  Patient is not currently taking medication because she did not like how medication made her feel, she report taking Xifaxan for only 6 days.  Patient is asking for an abdominal x-ray to rule out stool impaction.  Review of Systems  Constitutional: Negative for chills, fever, malaise/fatigue and weight loss.  HENT: Negative.   Respiratory: Negative.   Cardiovascular:  Negative.   Gastrointestinal: Positive for constipation and diarrhea. Negative for blood in stool, nausea and vomiting.  Genitourinary: Negative.   Skin: Negative.   All other systems reviewed and are negative.    Observations/Objective: Televisit  Assessment and Plan: Abdominal x-ray ordered to rule out stool impaction  Follow Up Instructions: Follow-up with worsening symptoms    I discussed the assessment and treatment plan with the patient. The patient was provided an opportunity to ask questions and all were answered. The patient agreed with the plan and demonstrated an understanding of the instructions.   The patient was advised to call back or seek an in-person evaluation if the symptoms worsen or if the condition fails to improve as anticipated.  The above assessment and management plan was discussed with the patient. The patient verbalized understanding of and has agreed to the management plan. Patient is aware to call the clinic if symptoms persist or worsen. Patient is aware when to return to the clinic for a follow-up visit. Patient educated on when it is appropriate to go to the emergency department.   Time call ended: 3:6pm  I provided 9 minutes of non-face-to-face time during this encounter.    Ivy Lynn, NP

## 2020-05-28 ENCOUNTER — Telehealth: Payer: Self-pay

## 2020-05-28 NOTE — Telephone Encounter (Signed)
Pt aware of results, will watch what she eats for a little bit to see if she can rule out anything that maybe causing her irritation & bloating

## 2020-05-28 NOTE — Telephone Encounter (Signed)
Patient is calling requesting xray results form [facility] done yesterday. Je saw the patient and ordered the xray - since she is out of the office today can you please review and advise.

## 2020-05-28 NOTE — Telephone Encounter (Signed)
Possible enteritis (irritated bowel, sometimes seen in viral GI illness). No obstruction

## 2020-06-02 DIAGNOSIS — R110 Nausea: Secondary | ICD-10-CM | POA: Diagnosis not present

## 2020-06-02 DIAGNOSIS — R059 Cough, unspecified: Secondary | ICD-10-CM | POA: Diagnosis not present

## 2020-06-02 DIAGNOSIS — R6889 Other general symptoms and signs: Secondary | ICD-10-CM | POA: Diagnosis not present

## 2020-06-02 DIAGNOSIS — J029 Acute pharyngitis, unspecified: Secondary | ICD-10-CM | POA: Diagnosis not present

## 2020-06-10 ENCOUNTER — Telehealth: Payer: Medicare HMO

## 2020-06-15 ENCOUNTER — Ambulatory Visit (INDEPENDENT_AMBULATORY_CARE_PROVIDER_SITE_OTHER): Payer: Medicare HMO | Admitting: Family Medicine

## 2020-06-15 ENCOUNTER — Other Ambulatory Visit: Payer: Self-pay

## 2020-06-15 ENCOUNTER — Encounter: Payer: Self-pay | Admitting: Family Medicine

## 2020-06-15 VITALS — BP 98/66 | HR 70 | Temp 98.2°F | Resp 20 | Ht 69.0 in | Wt 113.0 lb

## 2020-06-15 DIAGNOSIS — M810 Age-related osteoporosis without current pathological fracture: Secondary | ICD-10-CM

## 2020-06-15 DIAGNOSIS — G629 Polyneuropathy, unspecified: Secondary | ICD-10-CM

## 2020-06-15 DIAGNOSIS — D509 Iron deficiency anemia, unspecified: Secondary | ICD-10-CM

## 2020-06-15 DIAGNOSIS — R6884 Jaw pain: Secondary | ICD-10-CM

## 2020-06-15 DIAGNOSIS — R739 Hyperglycemia, unspecified: Secondary | ICD-10-CM | POA: Diagnosis not present

## 2020-06-15 DIAGNOSIS — E440 Moderate protein-calorie malnutrition: Secondary | ICD-10-CM | POA: Diagnosis not present

## 2020-06-15 DIAGNOSIS — G8929 Other chronic pain: Secondary | ICD-10-CM

## 2020-06-15 DIAGNOSIS — G894 Chronic pain syndrome: Secondary | ICD-10-CM

## 2020-06-15 DIAGNOSIS — G4763 Sleep related bruxism: Secondary | ICD-10-CM | POA: Diagnosis not present

## 2020-06-15 DIAGNOSIS — K146 Glossodynia: Secondary | ICD-10-CM | POA: Diagnosis not present

## 2020-06-15 DIAGNOSIS — J301 Allergic rhinitis due to pollen: Secondary | ICD-10-CM

## 2020-06-15 DIAGNOSIS — F411 Generalized anxiety disorder: Secondary | ICD-10-CM

## 2020-06-15 DIAGNOSIS — R69 Illness, unspecified: Secondary | ICD-10-CM | POA: Diagnosis not present

## 2020-06-15 DIAGNOSIS — K5909 Other constipation: Secondary | ICD-10-CM

## 2020-06-15 LAB — BAYER DCA HB A1C WAIVED: HB A1C (BAYER DCA - WAIVED): 5.1 % (ref <7.0)

## 2020-06-15 MED ORDER — TIZANIDINE HCL 2 MG PO CAPS: 2.0000 mg | ORAL_CAPSULE | Freq: Three times a day (TID) | ORAL | 2 refills | Status: DC | PRN

## 2020-06-15 MED ORDER — TRAMADOL HCL 50 MG PO TABS: 50.0000 mg | ORAL_TABLET | Freq: Two times a day (BID) | ORAL | 1 refills | Status: DC | PRN

## 2020-06-15 MED ORDER — MAGIC MOUTHWASH W/LIDOCAINE: ORAL | 0 refills | Status: DC

## 2020-06-15 MED ORDER — DESLORATADINE 5 MG PO TABS: 5.0000 mg | ORAL_TABLET | Freq: Every day | ORAL | 99 refills | Status: DC

## 2020-06-15 MED ORDER — LORAZEPAM 0.5 MG PO TABS: 0.5000 mg | ORAL_TABLET | Freq: Two times a day (BID) | ORAL | 1 refills | Status: DC | PRN

## 2020-06-15 NOTE — Progress Notes (Signed)
Follow Up Note  RE: Kristy Garza MRN: [id number] DOB: [birth year] Date of Office Visit: 06/16/2020  Referring provider: [name], DO Primary care provider: [name], DO  Chief Complaint: allergy  (Has congestion, drainage, a small cough. Nasal spray doesn't seem to be working as good.Burning tingling sensation on the tongue.)  History of Present Illness: I had the pleasure of seeing Kristy Garza for a follow up visit at the [facility] [place] on 06/16/2020. She is a 70 y.o. female, who is being followed for allergic rhinitis, asthma and reflux. Her previous allergy office visit was on 12/20/2019 with Althea Charon FNP. Today is a regular follow up visit.  Allergic rhinitis Patient has been having nasal drainage, coughing, throat clearing, itchy eyes.   Patient went to UC on 3/8 and was started on antibiotics - amoxicillin with some benefit.  Currently taking Astelin 2 spray per nostril BID, Nasonex 1 spray per nostril BID, saline rinse once a day, allegra 180mg  daily. Takes OTC decongestants prn.  Zyrtec caused drowsiness in the past.   No recent allergy testing and not able to come off antihistamines for 3 days.   She also noted some tingling/burning tongue.   Asthma Having some cough but no wheezing. No shortness of breath or chest tightness. Takes mucinex DM BID with some benefit.  Currently takes Arnuity 1 puff once a day and rinsing mouth after each use. Using albuterol 1-2 times per day for coughing with good benefit.  Patient saw Dr. Annamaria Boots in the past and was told she had COPD.  No prednisone since the last visit. No prior covid-19 diagnosis.   Reflux Currently on pantoprazole daily with good benefit.    Assessment and Plan: Kristy Garza is a 70 y.o. female with: Not well controlled moderate persistent asthma Currently on Arnuity 228mcg 1 puff once a day but still using albuterol 1-2 times per day due to coughing fits with good  benefit. No prednisone since the last visit.  Today's spirometry showed: normal pattern with 7% improvement in FEV1 post bronchodilator treatment. Clinically feeling much better.  . Daily controller medication(s): STOP Arnuity  . START Breo 180mcg 1 puff daily and rinse mouth after each use.  . May use albuterol rescue inhaler 2 puffs every 4 to 6 hours as needed for shortness of breath, chest tightness, coughing, and wheezing. May use albuterol rescue inhaler 2 puffs 5 to 15 minutes prior to strenuous physical activities. Monitor frequency of use.   Call us if Kristy Garza is too expensive.  Repeat spirometry at next visit.  Chronic rhinitis Increased symptoms. No recent testing. Unable to come off antihistamines for 3 days.  May use over the counter antihistamines such as Zyrtec (cetirizine), Claritin (loratadine), Allegra (fexofenadine), or Xyzal (levocetirizine) daily as needed.  May use Nasonex nasal spray 1 spray per nostril twice a day as needed for nasal congestion.   STOP Astelin nasal spray.  May use ipratropium nasal 0.06% spray 1-2 sprays per nostril three times a day as needed for runny nose/drainage.  Nasal saline spray (i.e., Simply Saline) or nasal saline lavage (i.e., NeilMed) is recommended as needed and prior to medicated nasal sprays. Get bloodwork for environmental panel.  May take Mucinex DM with plenty of water twice a day to loosen up the mucous.  Acid reflux Stable.  Continue dietary and lifestyle modifications as listed below  Continue pantoprazole 40mg  daily. Nothing to eat or drink 30 minutes afterwards.  Return in about 2  months (around 08/16/2020).  Meds ordered this encounter  Medications  . fluticasone furoate-vilanterol (BREO ELLIPTA) 100-25 MCG/INH AEPB    Sig: Inhale 1 puff into the lungs daily. Rinse mouth after each use.    Dispense:  60 each    Refill:  5    Lab Orders     Allergens w/Total IgE Area 2     CBC with  Differential/Platelet  Diagnostics: Spirometry:  Tracings reviewed. Her effort: Good reproducible efforts. FVC: 3.12L FEV1: 2.29L, 85% predicted FEV1/FVC ratio: 73% Interpretation: Spirometry consistent with normal pattern with 7% improvement in FEV1 post bronchodilator treatment. Clinically feeling much better.   Please see scanned spirometry results for details.  Medication List:  Current Outpatient Medications  Medication Sig Dispense Refill  . acetaminophen (TYLENOL) 500 MG tablet Take by mouth.    [name] albuterol (VENTOLIN HFA) 108 (90 Base) MCG/ACT inhaler Use 2 puffs every 4 hours as needed for cough, wheeze, tightness in chest, or shortness of breath 18 g 1  . azelastine (ASTELIN) 0.1 % nasal spray Place 2 sprays into both nostrils 2 (two) times daily. Use in each nostril as directed 30 mL 5  . b complex vitamins capsule Take 1 capsule by mouth daily.    [name] dextromethorphan-guaiFENesin (MUCINEX DM) 30-600 MG 12hr tablet Take 1 tablet by mouth 2 (two) times daily.    . fluticasone furoate-vilanterol (BREO ELLIPTA) 100-25 MCG/INH AEPB Inhale 1 puff into the lungs daily. Rinse mouth after each use. 60 each 5  . lidocaine (XYLOCAINE) 2 % jelly APPLY TOPICALLY TO AFFECTED AREA EVERY DAY AS NEEDED (use sparingly) 85 g 0  . LORazepam (ATIVAN) 0.5 MG tablet Take 1 tablet (0.5 mg total) by mouth 2 (two) times daily as needed for anxiety. 30 tablet 1  . magic mouthwash w/lidocaine SOLN Gargle and spit 49mL every 6 hours as needed for sore throat. 49mL lidocaine, 74mL nystatin, 60mg  hydrocortisone tab, qs benadryl total 448mL. 480 mL 0  . mometasone (NASONEX) 50 MCG/ACT nasal spray PLACE 2 SPRAYS INTO THE NOSE DAILY. 17 g 5  . pantoprazole (PROTONIX) 40 MG tablet Take 1 tablet (40 mg total) by mouth daily. 30 tablet 5  . phenylephrine (SUDAFED PE) 10 MG TABS tablet Take 10 mg by mouth every 4 (four) hours as needed.    . Polyethylene Glycol 3350 (MIRALAX PO) Take by mouth as needed.    .  Probiotic Product (PROBIOTIC PO) Take by mouth daily.    . Simethicone (GAS-X PO) Take by mouth.    . tizanidine (ZANAFLEX) 2 MG capsule Take 1-2 capsules (2-4 mg total) by mouth 3 (three) times daily as needed for muscle spasms. 60 capsule 2  . traMADol (ULTRAM) 50 MG tablet Take 1 tablet (50 mg total) by mouth every 12 (twelve) hours as needed. 30 tablet 1  . UNABLE TO FIND Take 1 capsule by mouth daily. Dv3- vitamin D 3 plus immune support     Current Facility-Administered Medications  Medication Dose Route Frequency Provider Last Rate Last Admin  . zoledronic acid (RECLAST) injection 5 mg  5 mg Intravenous Once Ronnie Doss M, DO       Allergies: Allergies  Allergen Reactions  . Cefuroxime Axetil Other (See Comments)    Extreme gas  . Gabapentin Other (See Comments)    Constipation Constipation  . Montelukast Other (See Comments)    Numbness and tingling in hand. Numbness and tingling in hand. Numbness and tingling in hand. NUMBNESS OF HANDS AND FEET. Numbness in  hands and feet NUMBNESS OF HANDS AND FEET.  . Pregabalin Other (See Comments)    Drowsiness, dry mouth Drowsiness, dry mouth Makes her tired and thirsty  . Augmentin [Amoxicillin-Pot Clavulanate] Diarrhea  . Avelox [Moxifloxacin Hcl In Nacl] Other (See Comments)    Tremors   . Biaxin [Clarithromycin]     Unsure of reaction  . Cedax [Ceftibuten] Other (See Comments)    tremors  . Ciprofloxacin Other (See Comments) and Nausea And Vomiting    Blurred vision Blurred vision Pt can't remember reaction  . Clindamycin/Lincomycin     tachycardia  . Levofloxacin Other (See Comments)    Feels dehydrated  . Lincomycin Other (See Comments)    tachycardia  . Montelukast Sodium Other (See Comments)    Numbness and tingling in hand. Numbness and tingling in hand.  . Sulfonamide Derivatives Other (See Comments)    Gi upset   I reviewed her past medical history, social history, family history, and environmental  history and no significant changes have been reported from her previous visit.  Review of Systems  Constitutional: Negative for appetite change, chills, fever and unexpected weight change.  HENT: Positive for congestion, postnasal drip and rhinorrhea.   Eyes: Negative for itching.  Respiratory: Positive for cough. Negative for chest tightness, shortness of breath and wheezing.   Gastrointestinal: Positive for constipation. Negative for abdominal pain.  Skin: Negative for rash.  Allergic/Immunologic: Positive for environmental allergies.  Neurological: Negative for headaches.   Objective: BP 110/70 (BP Location: Right Arm, Patient Position: Sitting, Cuff Size: Normal)   Pulse 74   Ht 5' 8.5" (1.74 m)   Wt 111 lb 8 oz (50.6 kg)   SpO2 99%   BMI 16.71 kg/m  Body mass index is 16.71 kg/m. Physical Exam Vitals and nursing note reviewed.  Constitutional:      Appearance: Normal appearance. She is well-developed.  HENT:     Head: Normocephalic and atraumatic.     Right Ear: External ear normal.     Left Ear: External ear normal.     Nose: Nose normal.     Mouth/Throat:     Mouth: Mucous membranes are moist.     Pharynx: Oropharynx is clear.  Eyes:     Conjunctiva/sclera: Conjunctivae normal.  Cardiovascular:     Rate and Rhythm: Normal rate and regular rhythm.     Heart sounds: Normal heart sounds. No murmur heard.   Pulmonary:     Effort: Pulmonary effort is normal.     Breath sounds: Normal breath sounds. No wheezing, rhonchi or rales.  Musculoskeletal:     Cervical back: Neck supple.  Skin:    General: Skin is warm.     Findings: No rash.  Neurological:     Mental Status: She is alert and oriented to person, place, and time.  Psychiatric:        Behavior: Behavior normal.    Previous notes and tests were reviewed. The plan was reviewed with the patient/family, and all questions/concerned were addressed.  It was my pleasure to see Kristy Garza today and participate in  her care. Please feel free to contact me with any questions or concerns.  Sincerely,  Rexene Alberts, DO Allergy & Immunology  [facility] office: [place] office: [phone]

## 2020-06-15 NOTE — Patient Instructions (Signed)

## 2020-06-15 NOTE — Progress Notes (Signed)
Subjective: CC: chronic constipation PCP: Kristy Norlander, DO KNL:Kristy Garza is a 70 y.o. female presenting to clinic today for:  1.  Chronic constipation/IBS She has had normal CT enterography. Failed medications include: OTC stool softener, Miralax, Linzess (diarrhea), Xifaxan. Low FODMAP diet recommended.  She unfortunately lost the bottle of Trulance I gave her last visit so she would like to try this.  She has an appointment with her gastroenterologist today.  She is undergoing interval iron infusions for iron deficiency anemia  2.  Anxiety Patient with ongoing anxiety.  She wants a refill on Ativan.  Last refill was 2020.  She has extremely rare use of this medicine and does not report any falls, visual or auditory hallucinations.  No respiratory depression.  3.  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  She has been seen by many specialist for this issue.  She uses Zanaflex and tramadol as needed severe pain is asking for renewals on this.  She suffers from chronic constipation as above but does not feel that symptoms are exacerbated by either of these medicines.  4.  Allergic rhinitis Patient has recently been treated for a sinus infection.  She reports some ongoing throat congestion and feeling she needs to clear her throat.  She currently takes Allegra but feels that she may be needs to switch.  She is not tried Claritin or Clarinex.  She is to use Zyrtec in the past and found this to be excessively sedating and therefore she does not use.  5.  Burning tongue Prior to the treatment for her sinus infection she started experiencing a burning tongue.  She does not report any significant swelling or difficulty swallowing.  She also notes associated numbness and tingling of the toes.  She worried about diabetes as potentially being a problem.  She admits that she does not really eat any starches or carbs because of the inflammation and causes.  Her diet predominantly consists of  meat and vegetables.  However, even the vegetables are limited because she is trying to follow a low FODmap diet.   ROS: Per HPI  Allergies  Allergen Reactions  . Cefuroxime Axetil Other (See Comments)    Extreme gas  . Gabapentin Other (See Comments)    Constipation Constipation  . Montelukast Other (See Comments)    Numbness and tingling in hand. Numbness and tingling in hand. Numbness and tingling in hand. NUMBNESS OF HANDS AND FEET. Numbness in hands and feet NUMBNESS OF HANDS AND FEET.  . Pregabalin Other (See Comments)    Drowsiness, dry mouth Drowsiness, dry mouth Makes her tired and thirsty  . Augmentin [Amoxicillin-Pot Clavulanate] Diarrhea  . Avelox [Moxifloxacin Hcl In Nacl] Other (See Comments)    Tremors   . Biaxin [Clarithromycin]     Unsure of reaction  . Cedax [Ceftibuten] Other (See Comments)    tremors  . Ciprofloxacin Other (See Comments) and Nausea And Vomiting    Blurred vision Blurred vision Pt can't remember reaction  . Clindamycin/Lincomycin     tachycardia  . Levofloxacin Other (See Comments)    Feels dehydrated  . Lincomycin Other (See Comments)    tachycardia  . Montelukast Sodium Other (See Comments)    Numbness and tingling in hand. Numbness and tingling in hand.  . Sulfonamide Derivatives Other (See Comments)    Gi upset   Past Medical History:  Diagnosis Date  . Acid reflux   . Allergy   . Arthritis    ARTHRITIS  IN NECK BY DR. Alroy Dust [facility]  . Asthma   . Interstitial cystitis   . Osteoporosis   . PONV (postoperative nausea and vomiting)   . Tinnitus   . Trigeminal neuralgia    Atypical trigeminal neuralgia    Current Outpatient Medications:  .  acetaminophen (TYLENOL) 500 MG tablet, Take by mouth., Disp: , Rfl:  .  albuterol (VENTOLIN HFA) 108 (90 Base) MCG/ACT inhaler, Use 2 puffs every 4 hours as needed for cough, wheeze, tightness in chest, or shortness of breath, Disp: 18 g, Rfl: 1 .  azelastine (ASTELIN) 0.1 % nasal  spray, Place 2 sprays into both nostrils 2 (two) times daily. Use in each nostril as directed, Disp: 30 mL, Rfl: 5 .  b complex vitamins capsule, Take 1 capsule by mouth daily., Disp: , Rfl:  .  dextromethorphan-guaiFENesin (MUCINEX DM) 30-600 MG 12hr tablet, Take 1 tablet by mouth 2 (two) times daily., Disp: , Rfl:  .  Fluticasone Furoate (ARNUITY ELLIPTA) 200 MCG/ACT AEPB, TAKE 1 PUFF BY MOUTH EVERY DAY, Disp: 30 each, Rfl: 5 .  lidocaine (XYLOCAINE) 2 % jelly, APPLY TOPICALLY TO AFFECTED AREA EVERY DAY AS NEEDED (use sparingly), Disp: 85 g, Rfl: 0 .  mometasone (NASONEX) 50 MCG/ACT nasal spray, PLACE 2 SPRAYS INTO THE NOSE DAILY., Disp: 17 g, Rfl: 5 .  pantoprazole (PROTONIX) 40 MG tablet, Take 1 tablet (40 mg total) by mouth daily., Disp: 30 tablet, Rfl: 5 .  phenylephrine (SUDAFED PE) 10 MG TABS tablet, Take 10 mg by mouth every 4 (four) hours as needed., Disp: , Rfl:  .  Polyethylene Glycol 3350 (MIRALAX PO), Take by mouth as needed., Disp: , Rfl:  .  Prasterone, DHEA, 25 MG CAPS, Take 2 mg by mouth daily., Disp: , Rfl:  .  Probiotic Product (PROBIOTIC PO), Take by mouth daily., Disp: , Rfl:  .  Simethicone (GAS-X PO), Take by mouth., Disp: , Rfl:  .  tizanidine (ZANAFLEX) 2 MG capsule, Take 1-2 capsules (2-4 mg total) by mouth 3 (three) times daily as needed for muscle spasms., Disp: 60 capsule, Rfl: 2 .  traMADol (ULTRAM) 50 MG tablet, Take 1 tablet (50 mg total) by mouth every 12 (twelve) hours as needed., Disp: 30 tablet, Rfl: 1 .  UNABLE TO FIND, Take 1 capsule by mouth daily. Dv3- vitamin D 3 plus immune support, Disp: , Rfl:   Current Facility-Administered Medications:  .  zoledronic acid (RECLAST) injection 5 mg, 5 mg, Intravenous, Once, Kristy Norlander, DO Social History   Socioeconomic History  . Marital status: Married    Spouse name: Kristy Garza  . Number of children: 3  . Years of education: 4  . Highest education level: Some college, no degree  Occupational History  .  Occupation: CNA    Comment: Retired  Tobacco Use  . Smoking status: Never Smoker  . Smokeless tobacco: Never Used  Vaping Use  . Vaping Use: Never used  Substance and Sexual Activity  . Alcohol use: No    Alcohol/week: 0.0 standard drinks    Comment: 01-19-2016 per pt no  . Drug use: No    Comment: 01-19-2016 per pt no   . Sexual activity: Not Currently    Birth control/protection: Surgical  Other Topics Concern  . Not on file  Social History Narrative   Lives with husband.    Social Determinants of Health   Financial Resource Strain: Low Risk   . Difficulty of Paying Living Expenses: Not hard at all  Food Insecurity: No Food Insecurity  . Worried About Programme researcher, broadcasting/film/video in the Last Year: Never true  . Ran Out of Food in the Last Year: Never true  Transportation Needs: No Transportation Needs  . Lack of Transportation (Medical): No  . Lack of Transportation (Non-Medical): No  Physical Activity: Sufficiently Active  . Days of Exercise per Week: 5 days  . Minutes of Exercise per Session: 30 min  Stress: No Stress Concern Present  . Feeling of Stress : Only a little  Social Connections: Socially Integrated  . Frequency of Communication with Friends and Family: More than three times a week  . Frequency of Social Gatherings with Friends and Family: More than three times a week  . Attends Religious Services: More than 4 times per year  . Active Member of Clubs or Organizations: Yes  . Attends Banker Meetings: More than 4 times per year  . Marital Status: Married  Catering manager Violence: Not At Risk  . Fear of Current or Ex-Partner: No  . Emotionally Abused: No  . Physically Abused: No  . Sexually Abused: No   Family History  Problem Relation Age of Onset  . Hyperlipidemia Mother   . Arthritis Mother   . Cancer Mother   . Lymphoma Mother   . Cancer Father   . Allergies Father   . Allergies Sister   . Allergies Sister   . Allergic rhinitis Neg Hx    . Angioedema Neg Hx   . Asthma Neg Hx   . Eczema Neg Hx   . Immunodeficiency Neg Hx   . Urticaria Neg Hx     Objective: Office vital signs reviewed. BP 98/66   Pulse 70   Temp 98.2 F (36.8 C)   Resp 20   Ht 5\' 9"  (1.753 m)   Wt 113 lb (51.3 kg)   SpO2 99%   BMI 16.69 kg/m   Physical Examination:  General: Awake, alert, malnourished, No acute distress HEENT: Normal; sclera white.  No exophthalmos.  No goiter.  Oropharynx clear with mild cobblestone appearance.  Tongue normal size.  No appreciable lesions Cardio: regular rate and rhythm, S1S2 heard, no murmurs appreciated Pulm: clear to auscultation bilaterally, no wheezes, rhonchi or rales; normal work of breathing on room air GI: flat, soft, non-tender, non-distended, bowel sounds present x4, no hepatomegaly, no splenomegaly, no masses Extremities: warm, well perfused, No edema, cyanosis or clubbing; +2 pulses bilaterally MSK: normal gait and station Skin: dry; intact; no rashes or lesions Neuro: No tremor Psych: Mood stable.  Patient is pleasant  Assessment/ Plan: 70 y.o. female   Chronic constipation - Plan: TSH, T4, Free, CMP14+EGFR  Age-related osteoporosis without current pathological fracture - Plan: TSH, T4, Free, CMP14+EGFR  Malnutrition of moderate degree (HCC) - Plan: Anemia Profile B, TSH, T4, Free, Magnesium, CMP14+EGFR, Bayer DCA Hb A1c Waived  Generalized anxiety disorder  Chronic pain syndrome  Bruxism, sleep-related - Plan: tizanidine (ZANAFLEX) 2 MG capsule  Chronic jaw pain - Plan: traMADol (ULTRAM) 50 MG tablet  Burning tongue - Plan: Anemia Profile B, magic mouthwash w/lidocaine SOLN  Neuropathy - Plan: Anemia Profile B  Iron deficiency anemia, unspecified iron deficiency anemia type - Plan: Anemia Profile B  Non-seasonal allergic rhinitis due to pollen - Plan: desloratadine (CLARINEX) 5 MG tablet  Check thyroid, CMP.  Suspect chronic constipation is IBS-D.  I have given her information  about psyllium corn husk as a natural supplement for constipation.  Have also given her  a sample of Trulance again today  She is to continue infusions for osteoporosis  Ativan renewed for anxiety disorder.  National narcotic database was reviewed and there were no red flags  Tramadol renewed as well as tizanidine.  Check anemia profile, check vitamin B12 given reports of neuropathy.  A1c also ordered but I have low suspicion for diabetes.  Clarinex ordered.  Her oral exam was fairly unremarkable.  Will order Magic mouthwash containing lidocaine given recent use of antibiotics.  Advised to hydrate adequately.  We will look for vitamin deficiency in the setting of malnutrition.  No orders of the defined types were placed in this encounter.  No orders of the defined types were placed in this encounter.   Kristy Norlander, DO [place] [phone]

## 2020-06-16 ENCOUNTER — Ambulatory Visit: Payer: Medicare HMO | Admitting: Allergy

## 2020-06-16 ENCOUNTER — Encounter: Payer: Self-pay | Admitting: Allergy

## 2020-06-16 VITALS — BP 110/70 | HR 74 | Ht 68.5 in | Wt 111.5 lb

## 2020-06-16 DIAGNOSIS — J4540 Moderate persistent asthma, uncomplicated: Secondary | ICD-10-CM

## 2020-06-16 DIAGNOSIS — K219 Gastro-esophageal reflux disease without esophagitis: Secondary | ICD-10-CM

## 2020-06-16 DIAGNOSIS — H04129 Dry eye syndrome of unspecified lacrimal gland: Secondary | ICD-10-CM | POA: Insufficient documentation

## 2020-06-16 DIAGNOSIS — J310 Chronic rhinitis: Secondary | ICD-10-CM | POA: Diagnosis not present

## 2020-06-16 DIAGNOSIS — I7300 Raynaud's syndrome without gangrene: Secondary | ICD-10-CM | POA: Insufficient documentation

## 2020-06-16 DIAGNOSIS — F59 Unspecified behavioral syndromes associated with physiological disturbances and physical factors: Secondary | ICD-10-CM | POA: Insufficient documentation

## 2020-06-16 DIAGNOSIS — R300 Dysuria: Secondary | ICD-10-CM | POA: Diagnosis not present

## 2020-06-16 DIAGNOSIS — M1990 Unspecified osteoarthritis, unspecified site: Secondary | ICD-10-CM | POA: Insufficient documentation

## 2020-06-16 LAB — CMP14+EGFR
ALT: 16 IU/L (ref 0–32)
AST: 25 IU/L (ref 0–40)
Albumin/Globulin Ratio: 1.7 (ref 1.2–2.2)
Albumin: 4.5 g/dL (ref 3.8–4.8)
Alkaline Phosphatase: 79 IU/L (ref 44–121)
BUN/Creatinine Ratio: 18 (ref 12–28)
BUN: 14 mg/dL (ref 8–27)
Bilirubin Total: 0.3 mg/dL (ref 0.0–1.2)
CO2: 23 mmol/L (ref 20–29)
Calcium: 9.6 mg/dL (ref 8.7–10.3)
Chloride: 101 mmol/L (ref 96–106)
Creatinine, Ser: 0.76 mg/dL (ref 0.57–1.00)
Globulin, Total: 2.6 g/dL (ref 1.5–4.5)
Glucose: 89 mg/dL (ref 65–99)
Potassium: 4.7 mmol/L (ref 3.5–5.2)
Sodium: 137 mmol/L (ref 134–144)
Total Protein: 7.1 g/dL (ref 6.0–8.5)
eGFR: 85 mL/min/{1.73_m2} (ref >59)

## 2020-06-16 LAB — MAGNESIUM: Magnesium: 2.2 mg/dL (ref 1.6–2.3)

## 2020-06-16 LAB — ANEMIA PROFILE B
Basophils Absolute: 0 10*3/uL (ref 0.0–0.2)
Basos: 1 %
EOS (ABSOLUTE): 0.1 10*3/uL (ref 0.0–0.4)
Eos: 4 %
Ferritin: 48 ng/mL (ref 15–150)
Folate: >20 ng/mL (ref >3.0)
Hematocrit: 36.1 % (ref 34.0–46.6)
Hemoglobin: 11.7 g/dL (ref 11.1–15.9)
Immature Grans (Abs): 0 10*3/uL (ref 0.0–0.1)
Immature Granulocytes: 0 %
Iron Saturation: 16 % (ref 15–55)
Iron: 53 ug/dL (ref 27–139)
Lymphocytes Absolute: 0.7 10*3/uL (ref 0.7–3.1)
Lymphs: 22 %
MCH: 28.3 pg (ref 26.6–33.0)
MCHC: 32.4 g/dL (ref 31.5–35.7)
MCV: 87 fL (ref 79–97)
Monocytes Absolute: 0.4 10*3/uL (ref 0.1–0.9)
Monocytes: 13 %
Neutrophils Absolute: 2 10*3/uL (ref 1.4–7.0)
Neutrophils: 60 %
Platelets: 194 10*3/uL (ref 150–450)
RBC: 4.13 x10E6/uL (ref 3.77–5.28)
RDW: 14.9 % (ref 11.7–15.4)
Retic Ct Pct: 0.7 % (ref 0.6–2.6)
Total Iron Binding Capacity: 331 ug/dL (ref 250–450)
UIBC: 278 ug/dL (ref 118–369)
Vitamin B-12: 585 pg/mL (ref 232–1245)
WBC: 3.2 10*3/uL — ABNORMAL LOW (ref 3.4–10.8)

## 2020-06-16 LAB — TSH: TSH: 0.326 u[IU]/mL — ABNORMAL LOW (ref 0.450–4.500)

## 2020-06-16 LAB — T4, FREE: Free T4: 1.25 ng/dL (ref 0.82–1.77)

## 2020-06-16 MED ORDER — BREO ELLIPTA 100-25 MCG/INH IN AEPB: 1.0000 | INHALATION_SPRAY | Freq: Every day | RESPIRATORY_TRACT | 5 refills | Status: DC

## 2020-06-16 NOTE — Assessment & Plan Note (Signed)
Currently on Arnuity 280mcg 1 puff once a day but still using albuterol 1-2 times per day due to coughing fits with good benefit. No prednisone since the last visit.  Today's spirometry showed: normal pattern with 7% improvement in FEV1 post bronchodilator treatment. Clinically feeling much better.  . Daily controller medication(s): STOP Arnuity  . START Breo 162mcg 1 puff daily and rinse mouth after each use.  . May use albuterol rescue inhaler 2 puffs every 4 to 6 hours as needed for shortness of breath, chest tightness, coughing, and wheezing. May use albuterol rescue inhaler 2 puffs 5 to 15 minutes prior to strenuous physical activities. Monitor frequency of use.   Call us if [facility] is too expensive.  Repeat spirometry at next visit.

## 2020-06-16 NOTE — Assessment & Plan Note (Signed)
Stable.   Continue dietary and lifestyle modifications as listed below  Continue pantoprazole 40mg daily. Nothing to eat or drink 30 minutes afterwards. 

## 2020-06-16 NOTE — Assessment & Plan Note (Signed)
Increased symptoms. No recent testing. Unable to come off antihistamines for 3 days.  May use over the counter antihistamines such as Zyrtec (cetirizine), Claritin (loratadine), Allegra (fexofenadine), or Xyzal (levocetirizine) daily as needed.  May use Nasonex nasal spray 1 spray per nostril twice a day as needed for nasal congestion.   STOP Astelin nasal spray.  May use ipratropium nasal 0.06% spray 1-2 sprays per nostril three times a day as needed for runny nose/drainage.  Nasal saline spray (i.e., Simply Saline) or nasal saline lavage (i.e., NeilMed) is recommended as needed and prior to medicated nasal sprays. Get bloodwork for environmental panel.  May take Mucinex DM with plenty of water twice a day to loosen up the mucous.

## 2020-06-16 NOTE — Patient Instructions (Addendum)
Rhinitis:  May use over the counter antihistamines such as Zyrtec (cetirizine), Claritin (loratadine), Allegra (fexofenadine), or Xyzal (levocetirizine) daily as needed.  May use Nasonex nasal spray 1 spray per nostril twice a day as needed for nasal congestion.   STOP Astelin nasal spray.  May use ipratropium nasal 0.06% spray 1-2 sprays per nostril three times a day as needed for runny nose/drainage.  Nasal saline spray (i.e., Simply Saline) or nasal saline lavage (i.e., NeilMed) is recommended as needed and prior to medicated nasal sprays. Get bloodwork:  We are ordering labs, so please allow 1-2 weeks for the results to come back. With the newly implemented Cures Act, the labs might be visible to you at the same time that they become visible to me. However, I will not address the results until all of the results are back, so please be patient.  In the meantime, continue recommendations in your patient instructions, including avoidance measures (if applicable), until you hear from me.  May take Mucinex DM with plenty of water twice a day to loosen up the mucous.  Asthma . Daily controller medication(s): STOP Ellipta . START Breo 13mcg 1 puff daily and rinse mouth after each use.  . May use albuterol rescue inhaler 2 puffs every 4 to 6 hours as needed for shortness of breath, chest tightness, coughing, and wheezing. May use albuterol rescue inhaler 2 puffs 5 to 15 minutes prior to strenuous physical activities. Monitor frequency of use.  . Asthma control goals:  o Full participation in all desired activities (may need albuterol before activity) o Albuterol use two times or less a week on average (not counting use with activity) o Cough interfering with sleep two times or less a month o Oral steroids no more than once a year o No hospitalizations   Call us if [facility] is too expensive.  Reflux  Continue dietary and lifestyle modifications as listed below  Continue pantoprazole 40mg   daily. Nothing to eat or drink 30 minutes afterwards.  Follow up in 2 months or sooner if needed.   Gastroesophageal Reflux Disease, Adult  Gastroesophageal reflux (GER) happens when acid from the stomach flows up into the tube that connects the mouth and the stomach (esophagus). Normally, food travels down the esophagus and stays in the stomach to be digested. With GER, food and stomach acid sometimes move back up into the esophagus. You may have a disease called gastroesophageal reflux disease (GERD) if the reflux:  Happens often.  Causes frequent or very bad symptoms.  Causes problems such as damage to the esophagus. When this happens, the esophagus becomes sore and swollen. Over time, GERD can make small holes (ulcers) in the lining of the esophagus. What are the causes? This condition is caused by a problem with the muscle between the esophagus and the stomach. When this muscle is weak or not normal, it does not close properly to keep food and acid from coming back up from the stomach. The muscle can be weak because of:  Tobacco use.  Pregnancy.  Having a certain type of hernia (hiatal hernia).  Alcohol use.  Certain foods and drinks, such as coffee, chocolate, onions, and peppermint. What increases the risk?  Being overweight.  Having a disease that affects your connective tissue.  Taking NSAIDs, such a ibuprofen. What are the signs or symptoms?  Heartburn.  Difficult or painful swallowing.  The feeling of having a lump in the throat.  A bitter taste in the mouth.  Bad breath.  Having a lot of saliva.  Having an upset or bloated stomach.  Burping.  Chest pain. Different conditions can cause chest pain. Make sure you see your doctor if you have chest pain.  Shortness of breath or wheezing.  A long-term cough or a cough at night.  Wearing away of the surface of teeth (tooth enamel).  Weight loss. How is this treated?  Making changes to your  diet.  Taking medicine.  Having surgery. Treatment will depend on how bad your symptoms are. Follow these instructions at home: Eating and drinking  Follow a diet as told by your doctor. You may need to avoid foods and drinks such as: ? Coffee and tea, with or without caffeine. ? Drinks that contain alcohol. ? Energy drinks and sports drinks. ? Bubbly (carbonated) drinks or sodas. ? Chocolate and cocoa. ? Peppermint and mint flavorings. ? Garlic and onions. ? Horseradish. ? Spicy and acidic foods. These include peppers, chili powder, curry powder, vinegar, hot sauces, and BBQ sauce. ? Citrus fruit juices and citrus fruits, such as oranges, lemons, and limes. ? Tomato-based foods. These include red sauce, chili, salsa, and pizza with red sauce. ? Fried and fatty foods. These include donuts, french fries, potato chips, and high-fat dressings. ? High-fat meats. These include hot dogs, rib eye steak, sausage, ham, and bacon. ? High-fat dairy items, such as whole milk, butter, and cream cheese.  Eat small meals often. Avoid eating large meals.  Avoid drinking large amounts of liquid with your meals.  Avoid eating meals during the 2-3 hours before bedtime.  Avoid lying down right after you eat.  Do not exercise right after you eat.   Lifestyle  Do not smoke or use any products that contain nicotine or tobacco. If you need help quitting, ask your doctor.  Try to lower your stress. If you need help doing this, ask your doctor.  If you are overweight, lose an amount of weight that is healthy for you. Ask your doctor about a safe weight loss goal.   General instructions  Pay attention to any changes in your symptoms.  Take over-the-counter and prescription medicines only as told by your doctor.  Do not take aspirin, ibuprofen, or other NSAIDs unless your doctor says it is okay.  Wear loose clothes. Do not wear anything tight around your waist.  Raise (elevate) the head of  your bed about 6 inches (15 cm). You may need to use a wedge to do this.  Avoid bending over if this makes your symptoms worse.  Keep all follow-up visits. Contact a doctor if:  You have new symptoms.  You lose weight and you do not know why.  You have trouble swallowing or it hurts to swallow.  You have wheezing or a cough that keeps happening.  You have a hoarse voice.  Your symptoms do not get better with treatment. Get help right away if:  You have sudden pain in your arms, neck, jaw, teeth, or back.  You suddenly feel sweaty, dizzy, or light-headed.  You have chest pain or shortness of breath.  You vomit and the vomit is green, yellow, or black, or it looks like blood or coffee grounds.  You faint.  Your poop (stool) is red, bloody, or black.  You cannot swallow, drink, or eat. These symptoms may represent a serious problem that is an emergency. Do not wait to see if the symptoms will go away. Get medical help right away. Call your local emergency services (  911 in the [place] Do not drive yourself to the hospital. Summary  If a person has gastroesophageal reflux disease (GERD), food and stomach acid move back up into the esophagus and cause symptoms or problems such as damage to the esophagus.  Treatment will depend on how bad your symptoms are.  Follow a diet as told by your doctor.  Take all medicines only as told by your doctor. This information is not intended to replace advice given to you by your health care provider. Make sure you discuss any questions you have with your health care provider. Document Revised: 09/23/2019 Document Reviewed: 09/23/2019 Elsevier Patient Education  [place].

## 2020-06-17 ENCOUNTER — Telehealth: Payer: Self-pay

## 2020-06-17 ENCOUNTER — Other Ambulatory Visit: Payer: Self-pay

## 2020-06-17 DIAGNOSIS — D729 Disorder of white blood cells, unspecified: Secondary | ICD-10-CM

## 2020-06-17 NOTE — Telephone Encounter (Signed)
Patient is having cramps in lower legs and feet  - mostly at night x couple of weeks. Patient states that they will wake her during the night . Please review and advise if there is something the patient can do for this or if she needs an appointment. LOV 06/15/20

## 2020-06-17 NOTE — Telephone Encounter (Signed)
All of her blood levels looked normal so doubt electrolyte deficiency.  Recommend stretching before sleep, reinforce adequate hydration.  If persistent, let me know and I can refer to sleep medicine for eval of restless leg.

## 2020-06-17 NOTE — Progress Notes (Signed)
R/C about lab results

## 2020-06-18 NOTE — Telephone Encounter (Signed)
Patient aware and verbalized understanding. °

## 2020-06-22 DIAGNOSIS — D509 Iron deficiency anemia, unspecified: Secondary | ICD-10-CM | POA: Diagnosis not present

## 2020-06-22 DIAGNOSIS — J310 Chronic rhinitis: Secondary | ICD-10-CM | POA: Diagnosis not present

## 2020-06-22 DIAGNOSIS — K5904 Chronic idiopathic constipation: Secondary | ICD-10-CM | POA: Diagnosis not present

## 2020-06-22 DIAGNOSIS — J4530 Mild persistent asthma, uncomplicated: Secondary | ICD-10-CM | POA: Diagnosis not present

## 2020-06-22 DIAGNOSIS — Z86010 Personal history of colonic polyps: Secondary | ICD-10-CM | POA: Diagnosis not present

## 2020-06-22 DIAGNOSIS — R159 Full incontinence of feces: Secondary | ICD-10-CM | POA: Diagnosis not present

## 2020-06-22 DIAGNOSIS — K5730 Diverticulosis of large intestine without perforation or abscess without bleeding: Secondary | ICD-10-CM | POA: Diagnosis not present

## 2020-06-23 ENCOUNTER — Telehealth: Payer: Self-pay | Admitting: Family Medicine

## 2020-06-23 NOTE — Telephone Encounter (Signed)
Pt having trouble sleeping. Was seen in office by Dr Darnell Level on 06/15/20, is there anything that can be recommended  for her to try

## 2020-06-23 NOTE — Telephone Encounter (Signed)
May take up to 10mg  of melatonin at bedtime.  Camomile tea/ lavender bath/ warm milk (if can drink) are all natural relaxants.

## 2020-06-23 NOTE — Telephone Encounter (Signed)
Patient aware of recommendation.  

## 2020-06-24 ENCOUNTER — Telehealth: Payer: Self-pay | Admitting: Allergy

## 2020-06-24 NOTE — Telephone Encounter (Signed)
Please advise 

## 2020-06-24 NOTE — Telephone Encounter (Signed)
Kristy Garza saw Dr. Maudie Mercury on 06/16/20. She was prescribed Breo and she said since she has been using it, she cannot sleep.She wants to know if this is a side effect. She wants to go off of the [place] and go back on Arnuity and wants to know if that is ok.

## 2020-06-24 NOTE — Telephone Encounter (Signed)
Called patient to go over [place] regimen. Left a message for her to call the office back.

## 2020-06-24 NOTE — Telephone Encounter (Signed)
Can you call patient and ask her if she's taking the Breo at night or in the morning?  If she's taking at night, then take it in the morning.  If she's taking it in the morning then I will have to switch Breo to a different inhaler as Arnuity was not controlling her symptoms.

## 2020-06-25 MED ORDER — NYSTATIN 100000 UNIT/ML MT SUSP: 5.0000 mL | Freq: Four times a day (QID) | OROMUCOSAL | 0 refills | Status: DC

## 2020-06-25 MED ORDER — BUDESONIDE-FORMOTEROL FUMARATE 80-4.5 MCG/ACT IN AERO: 2.0000 | INHALATION_SPRAY | Freq: Two times a day (BID) | RESPIRATORY_TRACT | 5 refills | Status: DC

## 2020-06-25 NOTE — Telephone Encounter (Signed)
Patient states that she cant remember if she was taking the Breo in the morning or mid day. Patient also states that she has nystatin in her refrigerator and wants to know if she could use that since she believes she has thrush. Patient states she cannot recall when her sleeping regimen was disrupted.

## 2020-06-25 NOTE — Addendum Note (Signed)
Addended by: Garnet Sierras on: 06/25/2020 05:55 PM   Modules accepted: Orders

## 2020-06-25 NOTE — Telephone Encounter (Signed)
Please call patient.  Stop Breo. Start Symbicort 67mcg 2 puffs twice a day with spacer and rinse mouth afterwards.  I will send in a new nystatin swish and swallow liquid medicine for the thrush.

## 2020-06-25 NOTE — Telephone Encounter (Signed)
Called and spoke to patient and she expressed understanding.

## 2020-06-26 LAB — CBC WITH DIFFERENTIAL/PLATELET
Basophils Absolute: 0 10*3/uL (ref 0.0–0.2)
Basos: 1 %
EOS (ABSOLUTE): 0.1 10*3/uL (ref 0.0–0.4)
Eos: 3 %
Hematocrit: 35.6 % (ref 34.0–46.6)
Hemoglobin: 11.6 g/dL (ref 11.1–15.9)
Immature Grans (Abs): 0 10*3/uL (ref 0.0–0.1)
Immature Granulocytes: 0 %
Lymphocytes Absolute: 0.7 10*3/uL (ref 0.7–3.1)
Lymphs: 14 %
MCH: 28.6 pg (ref 26.6–33.0)
MCHC: 32.6 g/dL (ref 31.5–35.7)
MCV: 88 fL (ref 79–97)
Monocytes Absolute: 0.4 10*3/uL (ref 0.1–0.9)
Monocytes: 8 %
Neutrophils Absolute: 3.6 10*3/uL (ref 1.4–7.0)
Neutrophils: 74 %
Platelets: 210 10*3/uL (ref 150–450)
RBC: 4.05 x10E6/uL (ref 3.77–5.28)
RDW: 15.3 % (ref 11.7–15.4)
WBC: 4.9 10*3/uL (ref 3.4–10.8)

## 2020-06-26 LAB — ALLERGENS W/TOTAL IGE AREA 2

## 2020-07-06 DIAGNOSIS — M5137 Other intervertebral disc degeneration, lumbosacral region: Secondary | ICD-10-CM | POA: Diagnosis not present

## 2020-07-06 DIAGNOSIS — M9902 Segmental and somatic dysfunction of thoracic region: Secondary | ICD-10-CM | POA: Diagnosis not present

## 2020-07-06 DIAGNOSIS — M9903 Segmental and somatic dysfunction of lumbar region: Secondary | ICD-10-CM | POA: Diagnosis not present

## 2020-07-06 DIAGNOSIS — M9901 Segmental and somatic dysfunction of cervical region: Secondary | ICD-10-CM | POA: Diagnosis not present

## 2020-07-08 ENCOUNTER — Other Ambulatory Visit: Payer: Self-pay

## 2020-07-08 ENCOUNTER — Other Ambulatory Visit: Payer: Medicare HMO

## 2020-07-08 DIAGNOSIS — D729 Disorder of white blood cells, unspecified: Secondary | ICD-10-CM

## 2020-07-08 DIAGNOSIS — M9902 Segmental and somatic dysfunction of thoracic region: Secondary | ICD-10-CM | POA: Diagnosis not present

## 2020-07-08 DIAGNOSIS — M9901 Segmental and somatic dysfunction of cervical region: Secondary | ICD-10-CM | POA: Diagnosis not present

## 2020-07-08 DIAGNOSIS — M9903 Segmental and somatic dysfunction of lumbar region: Secondary | ICD-10-CM | POA: Diagnosis not present

## 2020-07-08 DIAGNOSIS — M5137 Other intervertebral disc degeneration, lumbosacral region: Secondary | ICD-10-CM | POA: Diagnosis not present

## 2020-07-08 LAB — CBC WITH DIFFERENTIAL/PLATELET
Basophils Absolute: 0 10*3/uL (ref 0.0–0.2)
Basos: 1 %
EOS (ABSOLUTE): 0.1 10*3/uL (ref 0.0–0.4)
Eos: 2 %
Hematocrit: 34.1 % (ref 34.0–46.6)
Hemoglobin: 11.4 g/dL (ref 11.1–15.9)
Immature Grans (Abs): 0 10*3/uL (ref 0.0–0.1)
Immature Granulocytes: 1 %
Lymphocytes Absolute: 0.9 10*3/uL (ref 0.7–3.1)
Lymphs: 25 %
MCH: 29.4 pg (ref 26.6–33.0)
MCHC: 33.4 g/dL (ref 31.5–35.7)
MCV: 88 fL (ref 79–97)
Monocytes Absolute: 0.4 10*3/uL (ref 0.1–0.9)
Monocytes: 10 %
Neutrophils Absolute: 2.3 10*3/uL (ref 1.4–7.0)
Neutrophils: 61 %
Platelets: 214 10*3/uL (ref 150–450)
RBC: 3.88 x10E6/uL (ref 3.77–5.28)
RDW: 14.8 % (ref 11.7–15.4)
WBC: 3.8 10*3/uL (ref 3.4–10.8)

## 2020-07-12 IMAGING — CT CT MAXILLOFACIAL W/O CM
3 of 5 series · 14 of 47 positions shown, 16 images · non-contrast
Comparison: Study of 05/13/2011.

CLINICAL DATA: Sinusitis, chronic.

EXAM:
CT MAXILLOFACIAL WITHOUT CONTRAST
TECHNIQUE: Multidetector CT imaging of the maxillofacial structures was
performed. Multiplanar CT image reconstructions were also generated.

[Series 2: sinus 2.00 hr60 s3 axial · axial · 0.34mm/px · z∈[-616,-510]mm · 8 of 69 slices shown, 10 images]
[im 8/69  brain]
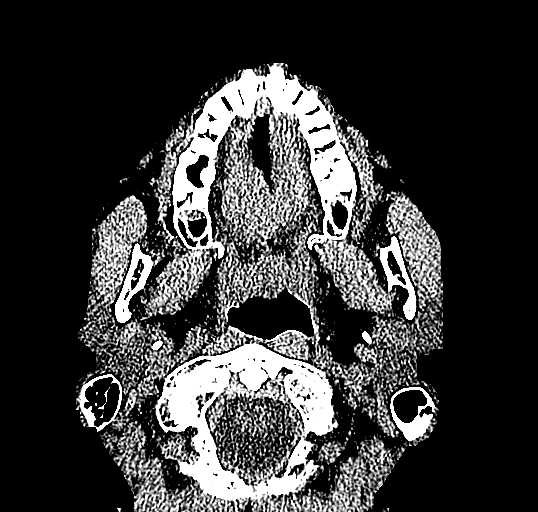
[im 8/69  bone]
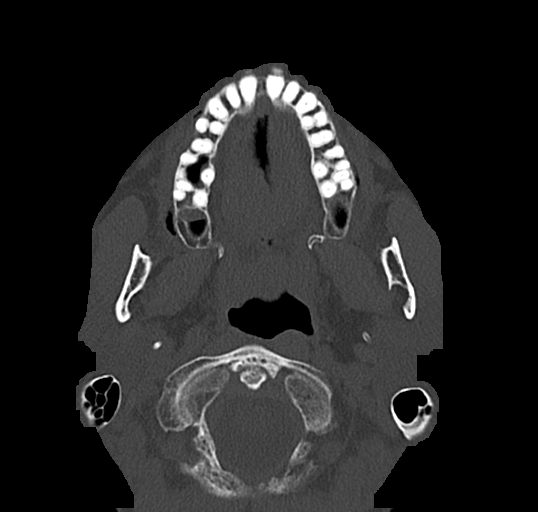
[im 16/69  bone]
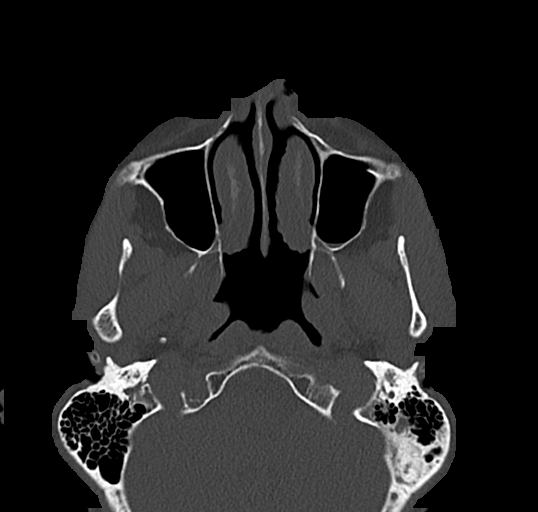
[im 23/69  bone]
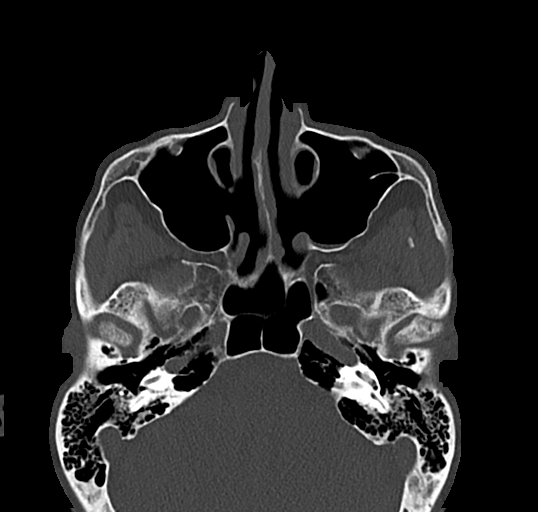
[im 31/69  bone]
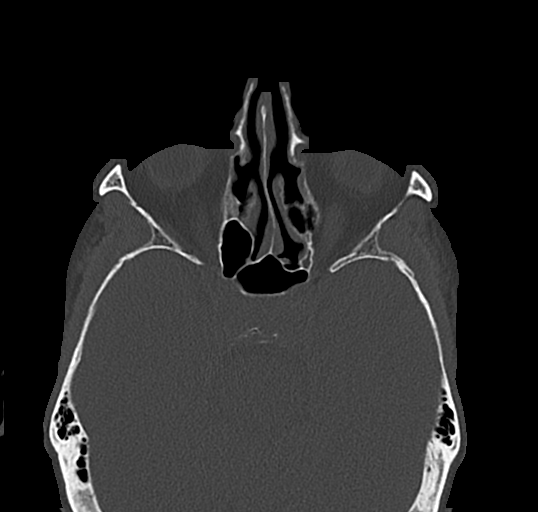
[im 38/69  brain]
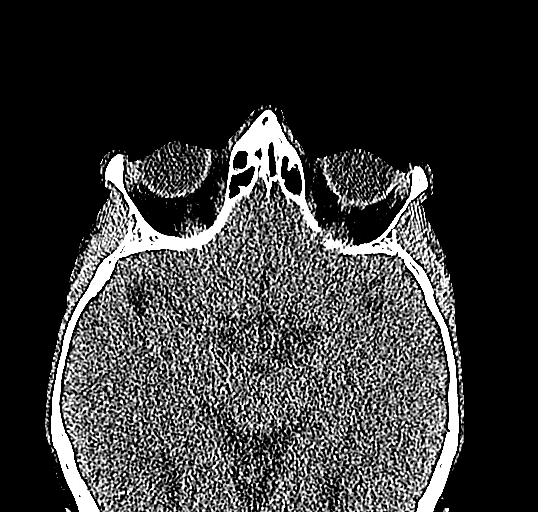
[im 38/69  bone]
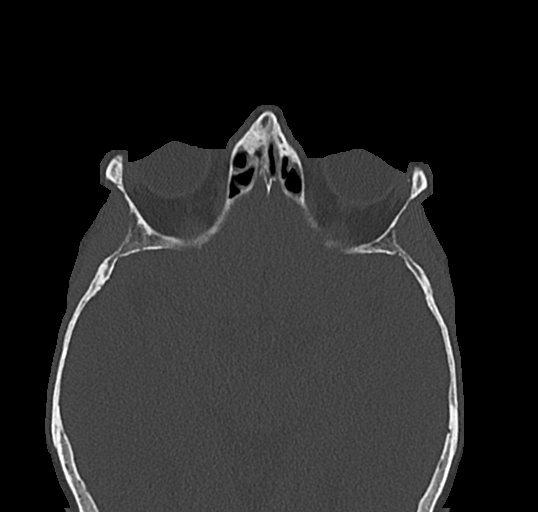
[im 46/69  bone]
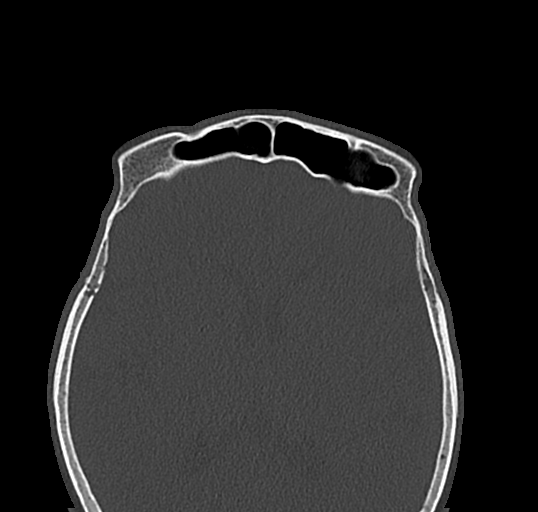
[im 53/69  bone]
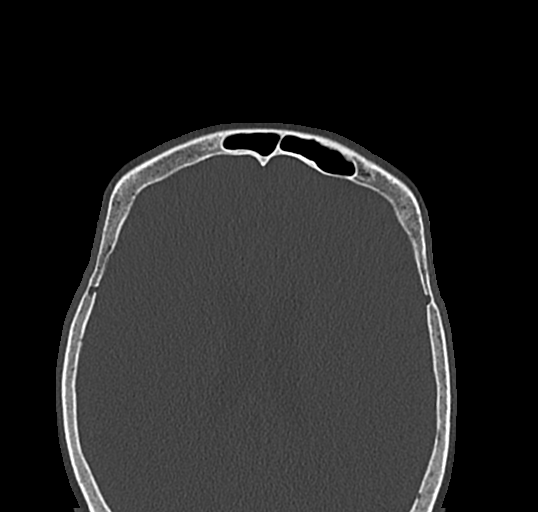
[im 61/69  bone]
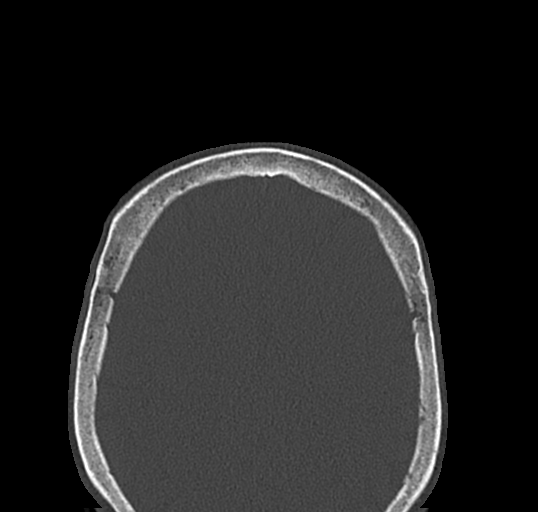

[Series 4: sinus 2.00 hr60 s3 cor · coronal · 0.27mm/px · 3 of 87 slices shown]
[im 29/87  bone]
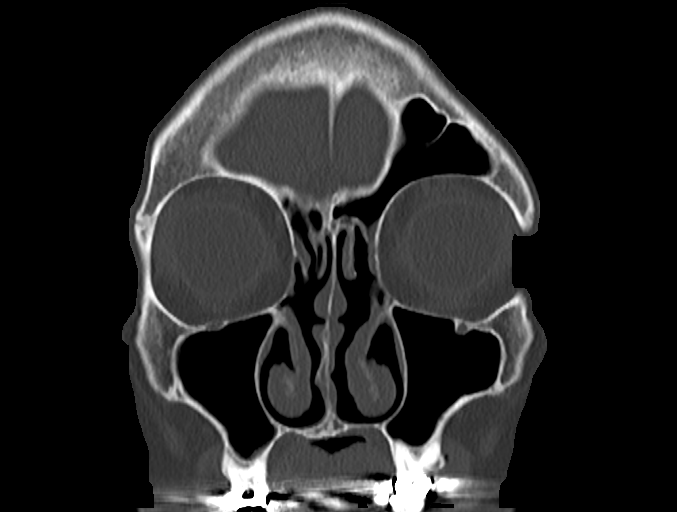
[im 39/87  bone]
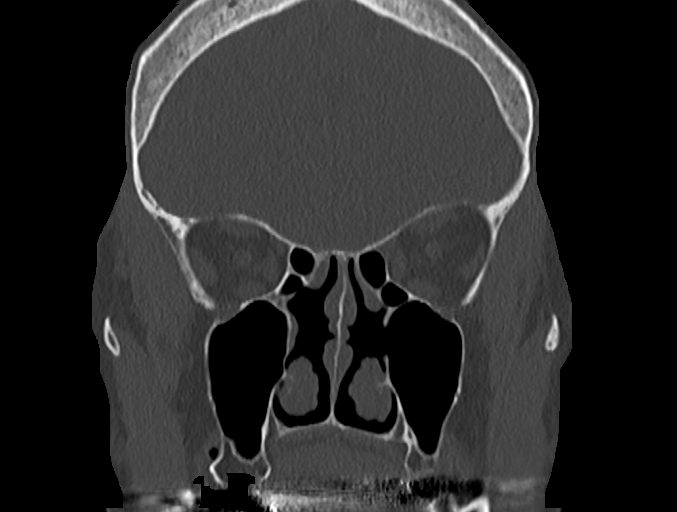
[im 48/87  bone]
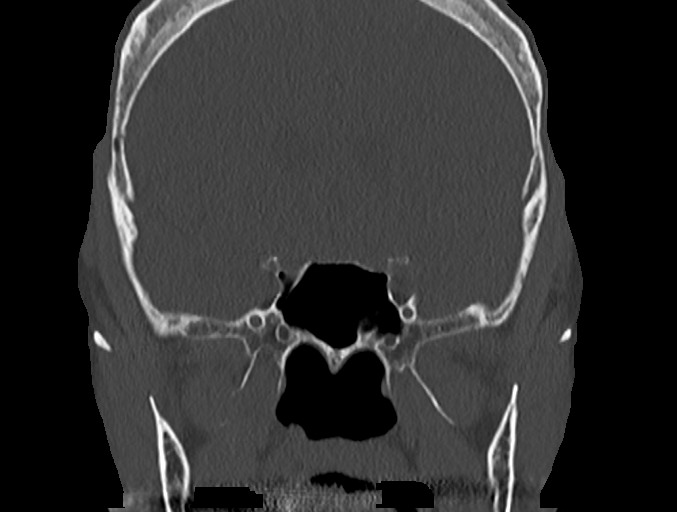

[Series 6: sinus 2.00 hr60 s3 sag · sagittal · 0.27mm/px · 3 of 91 slices shown]
[im 31/91  bone]
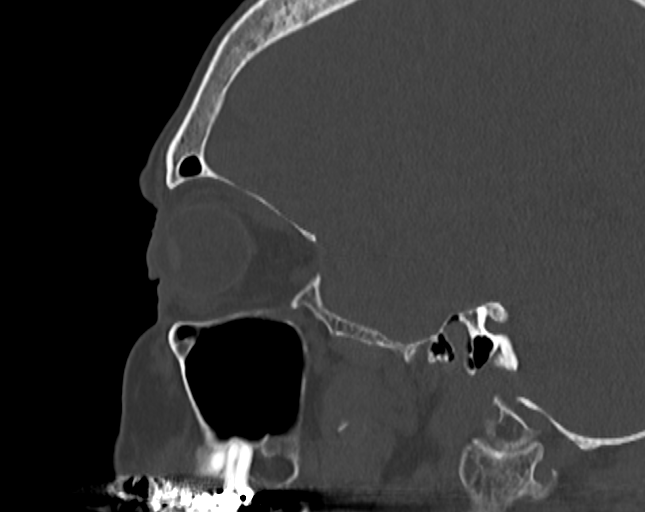
[im 46/91  bone]
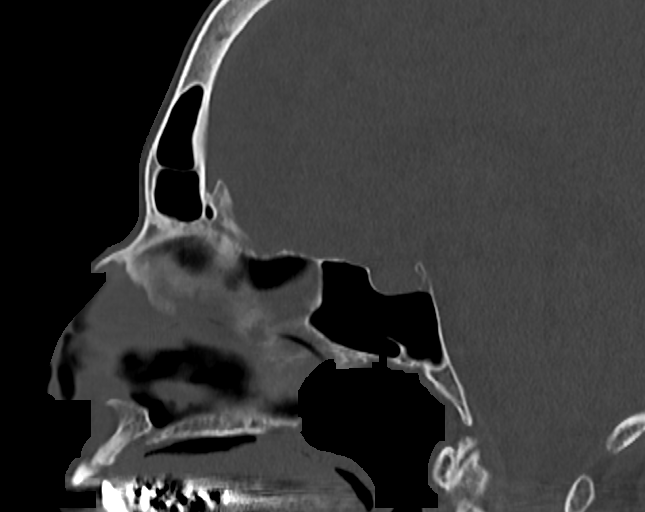
[im 61/91  bone]
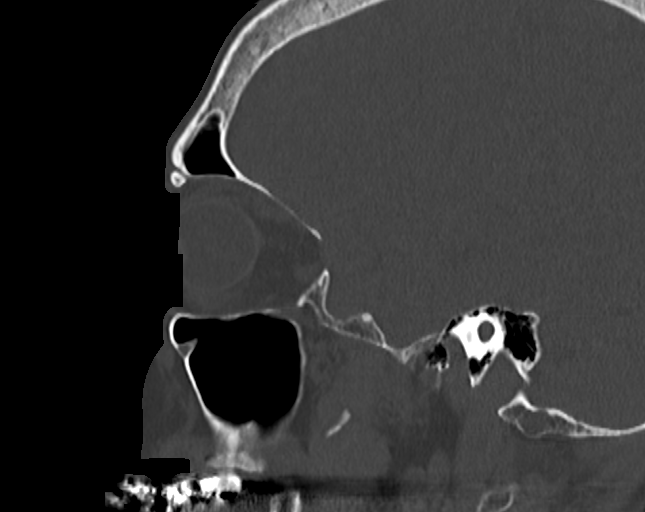

[14 of 47 positions shown; findings below may reference images not displayed]

FINDINGS: Osseous: No destructive bone finding. Mastoid air cells are clear.
Skull base is normal. Mandibular condyles are located. Zygomatic
arches are intact.

Orbits: Negative. No traumatic or inflammatory finding.

Sinuses: Configuration of ethmoid air cells and signs of extended
maxillary antrostomy with prior ethmoid surgical changes are similar
to the previous study. There is mucoperiosteal thickening in the
paranasal sinuses. There is no air-fluid level or signs of mucous
retention cyst or polyp no periapical lucency of maxillary
dentition.

No signs of mucoperiosteal thickening in the sphenoid sinuses.
Frontal sinuses are clear.

Soft tissues: Negative.

Limited intracranial: No significant or unexpected finding.
IMPRESSION: Postoperative changes as above. Mucosal thickening in the paranasal
sinuses may be slightly improved compared to prior study, remaining
moderate. No air-fluid level or signs of mucous retention cyst or
polyp

## 2020-07-13 ENCOUNTER — Telehealth: Payer: Self-pay | Admitting: Allergy

## 2020-07-13 DIAGNOSIS — M9901 Segmental and somatic dysfunction of cervical region: Secondary | ICD-10-CM | POA: Diagnosis not present

## 2020-07-13 DIAGNOSIS — M9902 Segmental and somatic dysfunction of thoracic region: Secondary | ICD-10-CM | POA: Diagnosis not present

## 2020-07-13 DIAGNOSIS — M9903 Segmental and somatic dysfunction of lumbar region: Secondary | ICD-10-CM | POA: Diagnosis not present

## 2020-07-13 DIAGNOSIS — M5137 Other intervertebral disc degeneration, lumbosacral region: Secondary | ICD-10-CM | POA: Diagnosis not present

## 2020-07-13 NOTE — Telephone Encounter (Signed)
Please call patient. Mychart message sent regarding results but patient did not view for over 14 days.    Your environmental allergy panel was negative to indoor/outdoor allergens. Blood count was normal. Continue with Symbicort 64mcg 2 puffs twice a day with spacer and rinse mouth afterwards.  Keep follow up in May.

## 2020-07-13 NOTE — Telephone Encounter (Signed)
Called patent to go over lab results she verbalized understanding, but shocked that the environmental panel came back negative. She understands to keep using the Symbicort 2 puffs bid with spacer and will keep may appointment.

## 2020-07-16 DIAGNOSIS — M9902 Segmental and somatic dysfunction of thoracic region: Secondary | ICD-10-CM | POA: Diagnosis not present

## 2020-07-16 DIAGNOSIS — M5137 Other intervertebral disc degeneration, lumbosacral region: Secondary | ICD-10-CM | POA: Diagnosis not present

## 2020-07-16 DIAGNOSIS — M9903 Segmental and somatic dysfunction of lumbar region: Secondary | ICD-10-CM | POA: Diagnosis not present

## 2020-07-16 DIAGNOSIS — M9901 Segmental and somatic dysfunction of cervical region: Secondary | ICD-10-CM | POA: Diagnosis not present

## 2020-07-20 DIAGNOSIS — M9901 Segmental and somatic dysfunction of cervical region: Secondary | ICD-10-CM | POA: Diagnosis not present

## 2020-07-20 DIAGNOSIS — M9902 Segmental and somatic dysfunction of thoracic region: Secondary | ICD-10-CM | POA: Diagnosis not present

## 2020-07-20 DIAGNOSIS — M9903 Segmental and somatic dysfunction of lumbar region: Secondary | ICD-10-CM | POA: Diagnosis not present

## 2020-07-20 DIAGNOSIS — M5137 Other intervertebral disc degeneration, lumbosacral region: Secondary | ICD-10-CM | POA: Diagnosis not present

## 2020-07-22 ENCOUNTER — Ambulatory Visit (INDEPENDENT_AMBULATORY_CARE_PROVIDER_SITE_OTHER): Payer: Medicare HMO | Admitting: Licensed Clinical Social Worker

## 2020-07-22 DIAGNOSIS — R519 Headache, unspecified: Secondary | ICD-10-CM

## 2020-07-22 DIAGNOSIS — M810 Age-related osteoporosis without current pathological fracture: Secondary | ICD-10-CM

## 2020-07-22 DIAGNOSIS — F411 Generalized anxiety disorder: Secondary | ICD-10-CM

## 2020-07-22 DIAGNOSIS — E782 Mixed hyperlipidemia: Secondary | ICD-10-CM

## 2020-07-22 DIAGNOSIS — K580 Irritable bowel syndrome with diarrhea: Secondary | ICD-10-CM

## 2020-07-22 DIAGNOSIS — K219 Gastro-esophageal reflux disease without esophagitis: Secondary | ICD-10-CM

## 2020-07-22 NOTE — Chronic Care Management (AMB) (Signed)
Chronic Care Management    Clinical Social Work Note  07/22/2020 Name: Kristy Garza MRN: [id number] DOB: Apr 06, 1950  Kristy Garza is a 70 y.o. year old female who is a primary care patient of [name], DO. The CCM team was consulted to assist the patient with chronic disease management and/or care coordination needs related to: [employer] .   Engaged with patient by telephone for follow up visit in response to provider referral for social work chronic care management and care coordination services.   Consent to Services:  The patient was given information about Chronic Care Management services, agreed to services, and gave verbal consent prior to initiation of services.  Please see initial visit note for detailed documentation.   Patient agreed to services and consent obtained.   Assessment: Review of patient past medical history, allergies, medications, and health status, including review of relevant consultants reports was performed today as part of a comprehensive evaluation and provision of chronic care management and care coordination services.     SDOH (Social Determinants of Health) assessments and interventions performed:  SDOH Interventions   Flowsheet Row Most Recent Value  SDOH Interventions   Depression Interventions/Treatment  Medication       Advanced Directives Status: See [employer]yn[employer] application for related entries.  CCM Care Plan  Allergies  Allergen Reactions  . Cefuroxime Axetil Other (See Comments)    Extreme gas  . Gabapentin Other (See Comments)    Constipation Constipation  . Montelukast Other (See Comments)    Numbness and tingling in hand. Numbness and tingling in hand. Numbness and tingling in hand. NUMBNESS OF HANDS AND FEET. Numbness in hands and feet NUMBNESS OF HANDS AND FEET.  . Pregabalin Other (See Comments)    Drowsiness, dry mouth Drowsiness, dry mouth Makes her tired and thirsty  . Augmentin [Amoxicillin-Pot  Clavulanate] Diarrhea  . Avelox [Moxifloxacin Hcl In Nacl] Other (See Comments)    Tremors   . Biaxin [Clarithromycin]     Unsure of reaction  . Cedax [Ceftibuten] Other (See Comments)    tremors  . Ciprofloxacin Other (See Comments) and Nausea And Vomiting    Blurred vision Blurred vision Pt can't remember reaction  . Clindamycin/Lincomycin     tachycardia  . Levofloxacin Other (See Comments)    Feels dehydrated  . Lincomycin Other (See Comments)    tachycardia  . Montelukast Sodium Other (See Comments)    Numbness and tingling in hand. Numbness and tingling in hand.  . Sulfonamide Derivatives Other (See Comments)    Gi upset    Outpatient Encounter Medications as of 07/22/2020  Medication Sig Note  . acetaminophen (TYLENOL) 500 MG tablet Take by mouth. 01/07/2016: Received from: [facility] Received Sig: Take by mouth.  [name] albuterol (VENTOLIN HFA) 108 (90 Base) MCG/ACT inhaler Use 2 puffs every 4 hours as needed for cough, wheeze, tightness in chest, or shortness of breath   . azelastine (ASTELIN) 0.1 % nasal spray Place 2 sprays into both nostrils 2 (two) times daily. Use in each nostril as directed   . b complex vitamins capsule Take 1 capsule by mouth daily.   . budesonide-formoterol (SYMBICORT) 80-4.5 MCG/ACT inhaler Inhale 2 puffs into the lungs in the morning and at bedtime. with spacer and rinse mouth afterwards.   . dextromethorphan-guaiFENesin (MUCINEX DM) 30-600 MG 12hr tablet Take 1 tablet by mouth 2 (two) times daily.   [name] lidocaine (XYLOCAINE) 2 % jelly APPLY TOPICALLY TO AFFECTED AREA EVERY  DAY AS NEEDED (use sparingly)   . LORazepam (ATIVAN) 0.5 MG tablet Take 1 tablet (0.5 mg total) by mouth 2 (two) times daily as needed for anxiety.   . magic mouthwash w/lidocaine SOLN Gargle and spit 26mL every 6 hours as needed for sore throat. 68mL lidocaine, 31mL nystatin, 60mg  hydrocortisone tab, qs benadryl total 460mL.   . mometasone (NASONEX) 50  MCG/ACT nasal spray PLACE 2 SPRAYS INTO THE NOSE DAILY.   [name] nystatin (MYCOSTATIN) 100000 UNIT/ML suspension Take 5 mLs (500,000 Units total) by mouth 4 (four) times daily. Swish and swallow   . pantoprazole (PROTONIX) 40 MG tablet Take 1 tablet (40 mg total) by mouth daily.   . phenylephrine (SUDAFED PE) 10 MG TABS tablet Take 10 mg by mouth every 4 (four) hours as needed.   . Polyethylene Glycol 3350 (MIRALAX PO) Take by mouth as needed.   . Probiotic Product (PROBIOTIC PO) Take by mouth daily.   . Simethicone (GAS-X PO) Take by mouth.   . tizanidine (ZANAFLEX) 2 MG capsule Take 1-2 capsules (2-4 mg total) by mouth 3 (three) times daily as needed for muscle spasms.   . traMADol (ULTRAM) 50 MG tablet Take 1 tablet (50 mg total) by mouth every 12 (twelve) hours as needed.   [name] UNABLE TO FIND Take 1 capsule by mouth daily. Dv3- vitamin D 3 plus immune support    Facility-Administered Encounter Medications as of 07/22/2020  Medication  . zoledronic acid (RECLAST) injection 5 mg    Patient Active Problem List   Diagnosis Date Noted  . Not well controlled moderate persistent asthma 06/16/2020  . Age-related osteoporosis without current pathological fracture 01/04/2019  . Malnutrition of moderate degree ([place]) 05/23/2018  . Adult BMI <19 kg/sq m 04/13/2018  . Pharyngitis 01/09/2018  . Anxiety state 01/19/2016  . Adjustment disorder with anxious mood 01/19/2016  . HLD (hyperlipidemia) 01/07/2016  . IBS (irritable bowel syndrome) 07/11/2015  . Chronic constipation 06/22/2015  . Chronic bronchitis ([place]) 05/07/2015  . 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 Trigeminal neuralgia 04/17/2014  . Face pain 01/22/2014  . 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 Asthma, chronic 05/13/2013  . Generalized anxiety disorder 05/13/2013  . Chronic rhinitis 05/13/2013  . Difficulty speaking 04/11/2013  . 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 Leg cramps 03/23/2013  . Acid reflux 03/11/2013  . Osteoporosis  01/30/2013  . Abdominal pain 01/16/2013  . Back ache 01/16/2013  . Chronic interstitial cystitis 01/16/2013  . FOM (frequency of micturition) 01/16/2013  . Seasonal allergic rhinitis 07/07/2012  . G E R D 08/15/2007  . VOCAL CORD POLYP, HX OF 08/15/2007    Conditions to be addressed/monitored: Monitor client management of depression and anxiety symptoms   Care Plan : LCSW Care Plan  Updates made by Katha Cabal, LCSW since 07/22/2020 12:00 AM    Problem: Emotional Distress     Goal: Emotional Health Supported;Manage depression issues; Manage anxiety issues   Start Date: 07/22/2020  Expected End Date: 10/21/2020  This Visit's Progress: On track  Priority: Medium  Note:   Current Barriers:  . Chronic Mental Health needs related to depression and anxiety . Pain issues . Suicidal Ideation/Homicidal Ideation: No  Clinical Social Work Goal(s):  . patient will work with SW monthly by telephone or in person to reduce or manage symptoms related to depression and anxiety . patient will work with SW monthly to address concerns related to pain issues of client   Interventions: . 1:1 collaboration with Ronnie Doss  M, DO regarding development and update of comprehensive plan of care as evidenced by provider attestation and co-signature . Talked with client about upcoming client medical appointments . Talked with client about pain issues (spoke of pain issues in her back) . Talked with client about medication procurement of client . Talked with client about appetite of client . Talked with client about sleeping issues of client . Talked with client about relaxation techniques of client (watches TV, listens to music, spends time with grandchildren) . Talked with client about vision of client . Talked with client about family support . Encouraged client to call RNCM as needed for nursing support  Patient Self Care Activities:  . Self administers medications as  prescribed . Attends all scheduled provider appointments . Performs ADL's independently  Patient Coping Strengths:  . Family . Friends  Patient Self Care Deficits:  [name] Depression issues . Anxiety issues  Patient Goals:  - spend time or talk with others at least 2 to 3 times per week - practice relaxation or meditation daily - keep a calendar with appointment dates  Follow Up Plan: LCSW to call client on 08/28/20     [name].Marv Alfrey MSW, LCSW Licensed Clinical Social Worker [facility] Care Management [phone]

## 2020-07-22 NOTE — Patient Instructions (Signed)
Visit Information  PATIENT GOALS: Goals Addressed            This Visit's Progress   . Protect My Health       Timeframe:  Short-Term Goal Priority:  Medium Progress: On Track Start Date:             07/22/20                Expected End Date:           10/21/20            Follow Up Date 08/28/20   Protect My Health (Patient)  Manage depression issues; Manage Anxiety issues    Why is this important?    Screening tests can find diseases early when they are easier to treat.   Your doctor or nurse will talk with you about which tests are important for you.   Getting shots for common diseases like the flu and shingles will help prevent them.     Patient Self Care Activities:  . Self administers medications as prescribed . Attends all scheduled provider appointments . Performs ADL's independently  Patient Coping Strengths:  . Family . Friends  Patient Self Care Deficits:  [name] Depression issues . Anxiety issues  Patient Goals:  - spend time or talk with others at least 2 to 3 times per week - practice relaxation or meditation daily - keep a calendar with appointment dates  Follow Up Plan: LCSW to call client on 08/28/20       [name].Kalif Kattner MSW, LCSW Licensed Clinical Social Worker [facility] Care Management [phone]

## 2020-07-27 DIAGNOSIS — M9903 Segmental and somatic dysfunction of lumbar region: Secondary | ICD-10-CM | POA: Diagnosis not present

## 2020-07-27 DIAGNOSIS — M9902 Segmental and somatic dysfunction of thoracic region: Secondary | ICD-10-CM | POA: Diagnosis not present

## 2020-07-27 DIAGNOSIS — M5137 Other intervertebral disc degeneration, lumbosacral region: Secondary | ICD-10-CM | POA: Diagnosis not present

## 2020-07-27 DIAGNOSIS — M9901 Segmental and somatic dysfunction of cervical region: Secondary | ICD-10-CM | POA: Diagnosis not present

## 2020-08-10 ENCOUNTER — Other Ambulatory Visit: Payer: Self-pay | Admitting: Allergy & Immunology

## 2020-08-10 DIAGNOSIS — J310 Chronic rhinitis: Secondary | ICD-10-CM

## 2020-08-11 DIAGNOSIS — N3010 Interstitial cystitis (chronic) without hematuria: Secondary | ICD-10-CM | POA: Diagnosis not present

## 2020-08-11 DIAGNOSIS — R102 Pelvic and perineal pain: Secondary | ICD-10-CM | POA: Diagnosis not present

## 2020-08-14 DIAGNOSIS — N3000 Acute cystitis without hematuria: Secondary | ICD-10-CM | POA: Diagnosis not present

## 2020-08-19 NOTE — Progress Notes (Signed)
Follow Up Note  RE: Kristy Garza MRN: [id number] DOB: 1950/05/07 Date of Office Visit: 08/20/2020  Referring provider: [name], DO Primary care provider: [name], DO  Chief Complaint: Asthma  History of Present Illness: I had the pleasure of seeing Kristy Garza for a follow up visit at the [facility] on 08/20/2020. She is a 70 y.o. female, who is being followed for asthma, chronic rhinitis and reflux. Her previous allergy office visit was on 06/16/2020 with Dr. Maudie Mercury. Today is a regular follow up visit.  Asthma: Currently on Symbicort 66mcg 1 puff once a day with spacer and rinsing mouth after each use. Got thrush after Breo.  Using albuterol about twice per week with good benefit.   Takes mucinex DM once a day with good benefit.   Denies any ER/urgent care visits or prednisone use since the last visit. Still has a lot of PND and coughing.   Chronic rhinitis Using Astelin nasal spray which does not work as well as ipratropium. No nosebleeds.   Acid reflux Only using pantoprazole 40mg  as needed.  Assessment and Plan: Kristy Garza is a 70 y.o. female with: Nonallergic rhinitis Past history - 2022 bloodwork negative to environmental allergy panel.  Interim history - increased PND but ran out of ipratropium.   Try to stop Allegra and see if you notice any worsening symptoms.   If you notice worsening symptoms then okay to restart daily.   May use Nasonex nasal spray 1 spray per nostril twice a day as needed for nasal congestion.   STOP Astelin nasal spray.  May use ipratropium nasal 0.06% spray 1-2 sprays per nostril three times a day as needed for runny nose/drainage.  Nasal saline spray (i.e., Simply Saline) or nasal saline lavage (i.e., NeilMed) is recommended as needed and prior to medicated nasal sprays.  May take Mucinex DM with plenty of water twice a day to loosen up the mucous.  Refer to ENT - for nonallergic rhinitis  (Dr. Wilburn Cornelia - patient seen him in the past).   Moderate persistent asthma without complication Past history - Breo caused thrush. 2022 spirometry showed: normal pattern with 7% improvement in FEV1 post bronchodilator treatment. Clinically feeling much better.  Interim history - only using Symbicort 40mcg 1 puff once a day and concerned if it keeps her awake at night.  . Today's spirometry was unremarkable.  . ACT score 23. . Daily controller medication(s): Increase Symbicort 34mcg to 2 puffs once a day in the mornings with spacer and rinse mouth afterwards. . May use albuterol rescue inhaler 2 puffs every 4 to 6 hours as needed for shortness of breath, chest tightness, coughing, and wheezing. May use albuterol rescue inhaler 2 puffs 5 to 15 minutes prior to strenuous physical activities. Monitor frequency of use.  . Get spirometry at next visit.  Acid reflux Only using med prn.  Continue dietary and lifestyle modifications as listed below  Continue pantoprazole 40mg  daily. Nothing to eat or drink 30 minutes afterwards.  Return in about 3 months (around 11/20/2020).  Meds ordered this encounter  Medications  . ipratropium (ATROVENT) 0.06 % nasal spray    Sig: Place 2 sprays into both nostrils 3 (three) times daily as needed (nasal drainage).    Dispense:  15 mL    Refill:  5   Lab Orders  No laboratory test(s) ordered today    Diagnostics: Spirometry:  Tracings reviewed. Her effort: Good reproducible efforts. FVC: 2.96L FEV1:  2.21L, 88% predicted FEV1/FVC ratio: 75% Interpretation: Spirometry consistent with normal pattern.  Please see scanned spirometry results for details.  Medication List:  Current Outpatient Medications  Medication Sig Dispense Refill  . ipratropium (ATROVENT) 0.06 % nasal spray Place 2 sprays into both nostrils 3 (three) times daily as needed (nasal drainage). 15 mL 5  . acetaminophen (TYLENOL) 500 MG tablet Take by mouth.    [name] albuterol (VENTOLIN  HFA) 108 (90 Base) MCG/ACT inhaler Use 2 puffs every 4 hours as needed for cough, wheeze, tightness in chest, or shortness of breath 18 g 1  . b complex vitamins capsule Take 1 capsule by mouth daily.    . budesonide-formoterol (SYMBICORT) 80-4.5 MCG/ACT inhaler Inhale 2 puffs into the lungs in the morning and at bedtime. with spacer and rinse mouth afterwards. 1 each 5  . dextromethorphan-guaiFENesin (MUCINEX DM) 30-600 MG 12hr tablet Take 1 tablet by mouth 2 (two) times daily.    [name] lidocaine (XYLOCAINE) 2 % jelly APPLY TOPICALLY TO AFFECTED AREA EVERY DAY AS NEEDED (use sparingly) 85 g 0  . LORazepam (ATIVAN) 0.5 MG tablet Take 1 tablet (0.5 mg total) by mouth 2 (two) times daily as needed for anxiety. 30 tablet 1  . magic mouthwash w/lidocaine SOLN Gargle and spit 39mL every 6 hours as needed for sore throat. 70mL lidocaine, 22mL nystatin, 60mg  hydrocortisone tab, qs benadryl total 472mL. 480 mL 0  . mometasone (NASONEX) 50 MCG/ACT nasal spray PLACE 2 SPRAYS INTO THE NOSE DAILY. 17 g 5  . nystatin (MYCOSTATIN) 100000 UNIT/ML suspension Take 5 mLs (500,000 Units total) by mouth 4 (four) times daily. Swish and swallow 60 mL 0  . pantoprazole (PROTONIX) 40 MG tablet Take 1 tablet (40 mg total) by mouth daily. 30 tablet 5  . phenylephrine (SUDAFED PE) 10 MG TABS tablet Take 10 mg by mouth every 4 (four) hours as needed.    . Polyethylene Glycol 3350 (MIRALAX PO) Take by mouth as needed.    . Probiotic Product (PROBIOTIC PO) Take by mouth daily.    . Simethicone (GAS-X PO) Take by mouth.    . tizanidine (ZANAFLEX) 2 MG capsule Take 1-2 capsules (2-4 mg total) by mouth 3 (three) times daily as needed for muscle spasms. 60 capsule 2  . traMADol (ULTRAM) 50 MG tablet Take 1 tablet (50 mg total) by mouth every 12 (twelve) hours as needed. 30 tablet 1  . UNABLE TO FIND Take 1 capsule by mouth daily. Dv3- vitamin D 3 plus immune support     Current Facility-Administered Medications  Medication Dose Route  Frequency Provider Last Rate Last Admin  . zoledronic acid (RECLAST) injection 5 mg  5 mg Intravenous Once Ronnie Doss M, DO       Allergies: Allergies  Allergen Reactions  . Cefuroxime Axetil Other (See Comments)    Extreme gas  . Gabapentin Other (See Comments)    Constipation Constipation  . Montelukast Other (See Comments)    Numbness and tingling in hand. Numbness and tingling in hand. Numbness and tingling in hand. NUMBNESS OF HANDS AND FEET. Numbness in hands and feet NUMBNESS OF HANDS AND FEET.  . Pregabalin Other (See Comments)    Drowsiness, dry mouth Drowsiness, dry mouth Makes her tired and thirsty  . Augmentin [Amoxicillin-Pot Clavulanate] Diarrhea  . Avelox [Moxifloxacin Hcl In Nacl] Other (See Comments)    Tremors   . Biaxin [Clarithromycin]     Unsure of reaction  . Cedax [Ceftibuten] Other (See Comments)  tremors  . Ciprofloxacin Other (See Comments) and Nausea And Vomiting    Blurred vision Blurred vision Pt can't remember reaction  . Clindamycin/Lincomycin     tachycardia  . Levofloxacin Other (See Comments)    Feels dehydrated  . Lincomycin Other (See Comments)    tachycardia  . Montelukast Sodium Other (See Comments)    Numbness and tingling in hand. Numbness and tingling in hand.  . Sulfonamide Derivatives Other (See Comments)    Gi upset   I reviewed her past medical history, social history, family history, and environmental history and no significant changes have been reported from her previous visit.  Review of Systems  Constitutional: Negative for appetite change, chills, fever and unexpected weight change.  HENT: Positive for postnasal drip and rhinorrhea.   Eyes: Negative for itching.  Respiratory: Positive for cough. Negative for chest tightness, shortness of breath and wheezing.   Gastrointestinal: Negative for abdominal pain.  Skin: Negative for rash.  Allergic/Immunologic: Positive for environmental allergies.   Neurological: Positive for headaches.   Objective: BP 108/64   Pulse 64   Resp 16   SpO2 99%  There is no height or weight on file to calculate BMI. Physical Exam Vitals and nursing note reviewed.  Constitutional:      Appearance: Normal appearance. She is well-developed.  HENT:     Head: Normocephalic and atraumatic.     Right Ear: External ear normal.     Left Ear: External ear normal.     Nose: Nose normal.     Mouth/Throat:     Mouth: Mucous membranes are moist.     Pharynx: Oropharynx is clear.  Eyes:     Conjunctiva/sclera: Conjunctivae normal.  Cardiovascular:     Rate and Rhythm: Normal rate and regular rhythm.     Heart sounds: Normal heart sounds. No murmur heard.   Pulmonary:     Effort: Pulmonary effort is normal.     Breath sounds: Normal breath sounds. No wheezing, rhonchi or rales.  Musculoskeletal:     Cervical back: Neck supple.  Skin:    General: Skin is warm.     Findings: No rash.  Neurological:     Mental Status: She is alert and oriented to person, place, and time.  Psychiatric:        Behavior: Behavior normal.    Previous notes and tests were reviewed. The plan was reviewed with the patient/family, and all questions/concerned were addressed.  It was my pleasure to see Yolandra today and participate in her care. Please feel free to contact me with any questions or concerns.  Sincerely,  Rexene Alberts, DO Allergy & Immunology  [facility] office: [place] office: [phone]

## 2020-08-20 ENCOUNTER — Other Ambulatory Visit: Payer: Self-pay

## 2020-08-20 ENCOUNTER — Encounter: Payer: Self-pay | Admitting: Allergy

## 2020-08-20 ENCOUNTER — Ambulatory Visit: Payer: Medicare HMO | Admitting: Allergy

## 2020-08-20 ENCOUNTER — Telehealth: Payer: Self-pay

## 2020-08-20 VITALS — BP 108/64 | HR 64 | Resp 16

## 2020-08-20 DIAGNOSIS — K219 Gastro-esophageal reflux disease without esophagitis: Secondary | ICD-10-CM

## 2020-08-20 DIAGNOSIS — J4540 Moderate persistent asthma, uncomplicated: Secondary | ICD-10-CM | POA: Diagnosis not present

## 2020-08-20 DIAGNOSIS — J310 Chronic rhinitis: Secondary | ICD-10-CM

## 2020-08-20 MED ORDER — IPRATROPIUM BROMIDE 0.06 % NA SOLN: 2.0000 | Freq: Three times a day (TID) | NASAL | 5 refills | Status: DC | PRN

## 2020-08-20 NOTE — Assessment & Plan Note (Addendum)
Past history - Breo caused thrush. 2022 spirometry showed: normal pattern with 7% improvement in FEV1 post bronchodilator treatment. Clinically feeling much better.  Interim history - only using Symbicort 8mcg 1 puff once a day and concerned if it keeps her awake at night.  . Today's spirometry was unremarkable.  . ACT score 23. . Daily controller medication(s): Increase Symbicort 84mcg to 2 puffs once a day in the mornings with spacer and rinse mouth afterwards. . May use albuterol rescue inhaler 2 puffs every 4 to 6 hours as needed for shortness of breath, chest tightness, coughing, and wheezing. May use albuterol rescue inhaler 2 puffs 5 to 15 minutes prior to strenuous physical activities. Monitor frequency of use.  . Get spirometry at next visit.

## 2020-08-20 NOTE — Assessment & Plan Note (Signed)
Past history - 2022 bloodwork negative to environmental allergy panel.  Interim history - increased PND but ran out of ipratropium.   Try to stop Allegra and see if you notice any worsening symptoms.   If you notice worsening symptoms then okay to restart daily.   May use Nasonex nasal spray 1 spray per nostril twice a day as needed for nasal congestion.   STOP Astelin nasal spray.  May use ipratropium nasal 0.06% spray 1-2 sprays per nostril three times a day as needed for runny nose/drainage.  Nasal saline spray (i.e., Simply Saline) or nasal saline lavage (i.e., NeilMed) is recommended as needed and prior to medicated nasal sprays.  May take Mucinex DM with plenty of water twice a day to loosen up the mucous.  Refer to ENT - for nonallergic rhinitis (Dr. Wilburn Cornelia - patient seen him in the past).

## 2020-08-20 NOTE — Patient Instructions (Addendum)
Non-allergic rhinitis  Try to stop Allegra and see if you notice any worsening symptoms.   If you notice worsening symptoms then okay to restart daily.   May use Nasonex nasal spray 1 spray per nostril twice a day as needed for nasal congestion.   STOP Astelin nasal spray.  May use ipratropium nasal 0.06% spray 1-2 sprays per nostril three times a day as needed for runny nose/drainage.  Nasal saline spray (i.e., Simply Saline) or nasal saline lavage (i.e., NeilMed) is recommended as needed and prior to medicated nasal sprays.  May take Mucinex DM with plenty of water twice a day to loosen up the mucous.  Refer to ENT - for nonallergic rhinitis (Dr. Wilburn Cornelia).   Asthma . Daily controller medication(s): Increase Symbicort 86mcg to 2 puffs once a day in the mornings with spacer and rinse mouth afterwards. . May use albuterol rescue inhaler 2 puffs every 4 to 6 hours as needed for shortness of breath, chest tightness, coughing, and wheezing. May use albuterol rescue inhaler 2 puffs 5 to 15 minutes prior to strenuous physical activities. Monitor frequency of use.  . Asthma control goals:  o Full participation in all desired activities (may need albuterol before activity) o Albuterol use two times or less a week on average (not counting use with activity) o Cough interfering with sleep two times or less a month o Oral steroids no more than once a year o No hospitalizations  Reflux  Continue dietary and lifestyle modifications as listed below  Continue pantoprazole 40mg  daily. Nothing to eat or drink 30 minutes afterwards.  Follow up in 3 months or sooner if needed.   Gastroesophageal Reflux Disease, Adult  Gastroesophageal reflux (GER) happens when acid from the stomach flows up into the tube that connects the mouth and the stomach (esophagus). Normally, food travels down the esophagus and stays in the stomach to be digested. With GER, food and stomach acid sometimes move back up  into the esophagus. You may have a disease called gastroesophageal reflux disease (GERD) if the reflux:  Happens often.  Causes frequent or very bad symptoms.  Causes problems such as damage to the esophagus. When this happens, the esophagus becomes sore and swollen. Over time, GERD can make small holes (ulcers) in the lining of the esophagus. What are the causes? This condition is caused by a problem with the muscle between the esophagus and the stomach. When this muscle is weak or not normal, it does not close properly to keep food and acid from coming back up from the stomach. The muscle can be weak because of:  Tobacco use.  Pregnancy.  Having a certain type of hernia (hiatal hernia).  Alcohol use.  Certain foods and drinks, such as coffee, chocolate, onions, and peppermint. What increases the risk?  Being overweight.  Having a disease that affects your connective tissue.  Taking NSAIDs, such a ibuprofen. What are the signs or symptoms?  Heartburn.  Difficult or painful swallowing.  The feeling of having a lump in the throat.  A bitter taste in the mouth.  Bad breath.  Having a lot of saliva.  Having an upset or bloated stomach.  Burping.  Chest pain. Different conditions can cause chest pain. Make sure you see your doctor if you have chest pain.  Shortness of breath or wheezing.  A long-term cough or a cough at night.  Wearing away of the surface of teeth (tooth enamel).  Weight loss. How is this treated?  Making changes  to your diet.  Taking medicine.  Having surgery. Treatment will depend on how bad your symptoms are. Follow these instructions at home: Eating and drinking  Follow a diet as told by your doctor. You may need to avoid foods and drinks such as: ? Coffee and tea, with or without caffeine. ? Drinks that contain alcohol. ? Energy drinks and sports drinks. ? Bubbly (carbonated) drinks or sodas. ? Chocolate and  cocoa. ? Peppermint and mint flavorings. ? Garlic and onions. ? Horseradish. ? Spicy and acidic foods. These include peppers, chili powder, curry powder, vinegar, hot sauces, and BBQ sauce. ? Citrus fruit juices and citrus fruits, such as oranges, lemons, and limes. ? Tomato-based foods. These include red sauce, chili, salsa, and pizza with red sauce. ? Fried and fatty foods. These include donuts, french fries, potato chips, and high-fat dressings. ? High-fat meats. These include hot dogs, rib eye steak, sausage, ham, and bacon. ? High-fat dairy items, such as whole milk, butter, and cream cheese.  Eat small meals often. Avoid eating large meals.  Avoid drinking large amounts of liquid with your meals.  Avoid eating meals during the 2-3 hours before bedtime.  Avoid lying down right after you eat.  Do not exercise right after you eat.   Lifestyle  Do not smoke or use any products that contain nicotine or tobacco. If you need help quitting, ask your doctor.  Try to lower your stress. If you need help doing this, ask your doctor.  If you are overweight, lose an amount of weight that is healthy for you. Ask your doctor about a safe weight loss goal.   General instructions  Pay attention to any changes in your symptoms.  Take over-the-counter and prescription medicines only as told by your doctor.  Do not take aspirin, ibuprofen, or other NSAIDs unless your doctor says it is okay.  Wear loose clothes. Do not wear anything tight around your waist.  Raise (elevate) the head of your bed about 6 inches (15 cm). You may need to use a wedge to do this.  Avoid bending over if this makes your symptoms worse.  Keep all follow-up visits. Contact a doctor if:  You have new symptoms.  You lose weight and you do not know why.  You have trouble swallowing or it hurts to swallow.  You have wheezing or a cough that keeps happening.  You have a hoarse voice.  Your symptoms do not get  better with treatment. Get help right away if:  You have sudden pain in your arms, neck, jaw, teeth, or back.  You suddenly feel sweaty, dizzy, or light-headed.  You have chest pain or shortness of breath.  You vomit and the vomit is green, yellow, or black, or it looks like blood or coffee grounds.  You faint.  Your poop (stool) is red, bloody, or black.  You cannot swallow, drink, or eat. These symptoms may represent a serious problem that is an emergency. Do not wait to see if the symptoms will go away. Get medical help right away. Call your local emergency services (911 in the [place] Do not drive yourself to the hospital. Summary  If a person has gastroesophageal reflux disease (GERD), food and stomach acid move back up into the esophagus and cause symptoms or problems such as damage to the esophagus.  Treatment will depend on how bad your symptoms are.  Follow a diet as told by your doctor.  Take all medicines only as told by  your doctor. This information is not intended to replace advice given to you by your health care provider. Make sure you discuss any questions you have with your health care provider. Document Revised: 09/23/2019 Document Reviewed: 09/23/2019 Elsevier Patient Education  [place].

## 2020-08-20 NOTE — Telephone Encounter (Signed)
Referral has been faxed along with notes and demographics to Dr. Wilburn Cornelia, [facility] [phone]. Left detailed message on patient's home phone with address, name, and phone number informing her of this information.

## 2020-08-20 NOTE — Assessment & Plan Note (Signed)
Only using med prn.  Continue dietary and lifestyle modifications as listed below  Continue pantoprazole 40mg  daily. Nothing to eat or drink 30 minutes afterwards.

## 2020-08-20 NOTE — Telephone Encounter (Signed)
ENT Referral (Dr. Wilburn Cornelia) Per Dr. Maudie Mercury

## 2020-08-25 NOTE — Telephone Encounter (Signed)
I checked [place] is scheduled for 10/02/2020 @ 1 with Dr Wilburn Cornelia .  Thanks

## 2020-08-28 ENCOUNTER — Ambulatory Visit (INDEPENDENT_AMBULATORY_CARE_PROVIDER_SITE_OTHER): Payer: Medicare HMO | Admitting: Licensed Clinical Social Worker

## 2020-08-28 DIAGNOSIS — E782 Mixed hyperlipidemia: Secondary | ICD-10-CM

## 2020-08-28 DIAGNOSIS — E440 Moderate protein-calorie malnutrition: Secondary | ICD-10-CM

## 2020-08-28 DIAGNOSIS — F411 Generalized anxiety disorder: Secondary | ICD-10-CM

## 2020-08-28 DIAGNOSIS — K580 Irritable bowel syndrome with diarrhea: Secondary | ICD-10-CM

## 2020-08-28 DIAGNOSIS — K219 Gastro-esophageal reflux disease without esophagitis: Secondary | ICD-10-CM

## 2020-08-28 DIAGNOSIS — R519 Headache, unspecified: Secondary | ICD-10-CM

## 2020-08-28 DIAGNOSIS — M810 Age-related osteoporosis without current pathological fracture: Secondary | ICD-10-CM

## 2020-08-28 NOTE — Chronic Care Management (AMB) (Signed)
Chronic Care Management    Clinical Social Work Note  08/28/2020 Name: Kristy Garza MRN: [id number] DOB: [birth year]  Kristy Garza is a 70 y.o. year old female who is a primary care patient of [name], DO. The CCM team was consulted to assist the patient with chronic disease management and/or care coordination needs related to: [employer] .   Engaged with patient by telephone for follow up visit in response to provider referral for social work chronic care management and care coordination services.   Consent to Services:  The patient was given information about Chronic Care Management services, agreed to services, and gave verbal consent prior to initiation of services.  Please see initial visit note for detailed documentation.   Patient agreed to services and consent obtained.   Assessment: Review of patient past medical history, allergies, medications, and health status, including review of relevant consultants reports was performed today as part of a comprehensive evaluation and provision of chronic care management and care coordination services.     SDOH (Social Determinants of Health) assessments and interventions performed:  SDOH Interventions   Flowsheet Row Most Recent Value  SDOH Interventions   Depression Interventions/Treatment  Medication       Advanced Directives Status: See Vyn[employer] application for related entries.  CCM Care Plan  Allergies  Allergen Reactions  . Cefuroxime Axetil Other (See Comments)    Extreme gas  . Gabapentin Other (See Comments)    Constipation Constipation  . Montelukast Other (See Comments)    Numbness and tingling in hand. Numbness and tingling in hand. Numbness and tingling in hand. NUMBNESS OF HANDS AND FEET. Numbness in hands and feet NUMBNESS OF HANDS AND FEET.  . Pregabalin Other (See Comments)    Drowsiness, dry mouth Drowsiness, dry mouth Makes her tired and thirsty  . Augmentin [Amoxicillin-Pot  Clavulanate] Diarrhea  . Avelox [Moxifloxacin Hcl In Nacl] Other (See Comments)    Tremors   . Biaxin [Clarithromycin]     Unsure of reaction  . Cedax [Ceftibuten] Other (See Comments)    tremors  . Ciprofloxacin Other (See Comments) and Nausea And Vomiting    Blurred vision Blurred vision Pt can't remember reaction  . Clindamycin/Lincomycin     tachycardia  . Levofloxacin Other (See Comments)    Feels dehydrated  . Lincomycin Other (See Comments)    tachycardia  . Montelukast Sodium Other (See Comments)    Numbness and tingling in hand. Numbness and tingling in hand.  . Sulfonamide Derivatives Other (See Comments)    Gi upset    Outpatient Encounter Medications as of 08/28/2020  Medication Sig Note  . acetaminophen (TYLENOL) 500 MG tablet Take by mouth. 01/07/2016: Received from: [facility] Received Sig: Take by mouth.  [name] albuterol (VENTOLIN HFA) 108 (90 Base) MCG/ACT inhaler Use 2 puffs every 4 hours as needed for cough, wheeze, tightness in chest, or shortness of breath   . b complex vitamins capsule Take 1 capsule by mouth daily.   . budesonide-formoterol (SYMBICORT) 80-4.5 MCG/ACT inhaler Inhale 2 puffs into the lungs in the morning and at bedtime. with spacer and rinse mouth afterwards.   . dextromethorphan-guaiFENesin (MUCINEX DM) 30-600 MG 12hr tablet Take 1 tablet by mouth 2 (two) times daily.   [name] ipratropium (ATROVENT) 0.06 % nasal spray Place 2 sprays into both nostrils 3 (three) times daily as needed (nasal drainage).   [name] lidocaine (XYLOCAINE) 2 % jelly APPLY TOPICALLY TO AFFECTED AREA EVERY DAY AS  NEEDED (use sparingly)   . LORazepam (ATIVAN) 0.5 MG tablet Take 1 tablet (0.5 mg total) by mouth 2 (two) times daily as needed for anxiety.   . magic mouthwash w/lidocaine SOLN Gargle and spit 60mL every 6 hours as needed for sore throat. 63mL lidocaine, 72mL nystatin, 60mg  hydrocortisone tab, qs benadryl total 459mL.   . mometasone (NASONEX) 50  MCG/ACT nasal spray PLACE 2 SPRAYS INTO THE NOSE DAILY.   [name] nystatin (MYCOSTATIN) 100000 UNIT/ML suspension Take 5 mLs (500,000 Units total) by mouth 4 (four) times daily. Swish and swallow   . pantoprazole (PROTONIX) 40 MG tablet Take 1 tablet (40 mg total) by mouth daily.   . phenylephrine (SUDAFED PE) 10 MG TABS tablet Take 10 mg by mouth every 4 (four) hours as needed.   . Polyethylene Glycol 3350 (MIRALAX PO) Take by mouth as needed.   . Probiotic Product (PROBIOTIC PO) Take by mouth daily.   . Simethicone (GAS-X PO) Take by mouth.   . tizanidine (ZANAFLEX) 2 MG capsule Take 1-2 capsules (2-4 mg total) by mouth 3 (three) times daily as needed for muscle spasms.   . traMADol (ULTRAM) 50 MG tablet Take 1 tablet (50 mg total) by mouth every 12 (twelve) hours as needed.   [name] UNABLE TO FIND Take 1 capsule by mouth daily. Dv3- vitamin D 3 plus immune support    Facility-Administered Encounter Medications as of 08/28/2020  Medication  . zoledronic acid (RECLAST) injection 5 mg    Patient Active Problem List   Diagnosis Date Noted  . Moderate persistent asthma without complication 93/23/5573  . Age-related osteoporosis without current pathological fracture 01/04/2019  . Malnutrition of moderate degree ([place]) 05/23/2018  . Adult BMI <19 kg/sq m 04/13/2018  . Pharyngitis 01/09/2018  . Anxiety state 01/19/2016  . Adjustment disorder with anxious mood 01/19/2016  . HLD (hyperlipidemia) 01/07/2016  . IBS (irritable bowel syndrome) 07/11/2015  . Chronic constipation 06/22/2015  . Chronic bronchitis ([place]) 05/07/2015  . 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 Trigeminal neuralgia 04/17/2014  . Face pain 01/22/2014  . 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 Asthma, chronic 05/13/2013  . Generalized anxiety disorder 05/13/2013  . Nonallergic rhinitis 05/13/2013  . Difficulty speaking 04/11/2013  . 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 Leg cramps 03/23/2013  . Acid reflux 03/11/2013  . Osteoporosis  01/30/2013  . Abdominal pain 01/16/2013  . Back ache 01/16/2013  . Chronic interstitial cystitis 01/16/2013  . FOM (frequency of micturition) 01/16/2013  . Seasonal allergic rhinitis 07/07/2012  . G E R D 08/15/2007  . VOCAL CORD POLYP, HX OF 08/15/2007    Conditions to be addressed/monitored: Monitor client management of anxiety and depression issues faced   Care Plan : LCSW Care Plan  Updates made by Kristy Cabal, LCSW since 08/28/2020 12:00 AM    Problem: Emotional Distress     Goal: Emotional Health Supported;Manage depression issues; Manage anxiety issues   Start Date: 07/22/2020  Expected End Date: 10/21/2020  This Visit's Progress: On track  Recent Progress: On track  Priority: Medium  Note:   Current Barriers:  . Chronic Mental Health needs related to depression and anxiety . Pain issues . Suicidal Ideation/Homicidal Ideation: No  Clinical Social Work Goal(s):  . patient will work with SW monthly by telephone or in person to reduce or manage symptoms related to depression and anxiety . patient will work with SW monthly to address concerns related to pain issues of client   Interventions: . 1:1 collaboration  with [name], DO regarding development and update of comprehensive plan of care as evidenced by provider attestation and co-signature . Talked with client about her tongue has burning feeling regularly (she said she had talked with Dr. [name] about this issue) . Talked with client about upcoming client medical appointments (she said she has appointment with arthritis doctor next Tuesday) . Talked with client about pain issues (spoke of pain issues in her back) . Talked with client about medication procurement of client . Talked with client about appetite of client . Talked with client about sleeping issues of client . Talked with client about relaxation techniques of client (watches TV, listens to music, spends time with grandchildren) . Talked  with client about vision of client . Encouraged client to call RNCM as needed for nursing support . Collaborated with RNCM about nursing needs of client  Patient Self Care Activities:  . Self administers medications as prescribed . Attends all scheduled provider appointments . Performs ADL's independently  Patient Coping Strengths:  . Family . Friends  Patient Self Care Deficits:  [name] Depression issues . Anxiety issues  Patient Goals:  - spend time or talk with others at least 2 to 3 times per week - practice relaxation or meditation daily - keep a calendar with appointment dates  Follow Up Plan: LCSW to call client on 10/13/20     [name].Emannuel Vise MSW, LCSW Licensed Clinical Social Worker [facility] Care Management [phone]

## 2020-08-28 NOTE — Patient Instructions (Signed)
Visit Information  PATIENT GOALS: Goals Addressed            This Visit's Progress   . Protect My Health; Manage Anxiety issues; Manage Depression issues       Timeframe:  Short-Term Goal Priority:  Medium Progress: On Track Start Date:             07/22/20                Expected End Date:           10/21/20            Follow Up Date 10/13/20   Protect My Health (Patient)  Manage depression issues; Manage Anxiety issues    Why is this important?    Screening tests can find diseases early when they are easier to treat.   Your doctor or nurse will talk with you about which tests are important for you.   Getting shots for common diseases like the flu and shingles will help prevent them.     Patient Self Care Activities:  . Self administers medications as prescribed . Attends all scheduled provider appointments . Performs ADL's independently  Patient Coping Strengths:  . Family . Friends  Patient Self Care Deficits:  [name] Depression issues . Anxiety issues  Patient Goals:  - spend time or talk with others at least 2 to 3 times per week - practice relaxation or meditation daily - keep a calendar with appointment dates  Follow Up Plan: LCSW to call client on 10/13/20       [name].Diavian Furgason MSW, LCSW Licensed Clinical Social Worker [facility] Care Management [phone]

## 2020-09-01 DIAGNOSIS — I7300 Raynaud's syndrome without gangrene: Secondary | ICD-10-CM | POA: Diagnosis not present

## 2020-09-01 DIAGNOSIS — M810 Age-related osteoporosis without current pathological fracture: Secondary | ICD-10-CM | POA: Diagnosis not present

## 2020-09-01 DIAGNOSIS — H04129 Dry eye syndrome of unspecified lacrimal gland: Secondary | ICD-10-CM | POA: Diagnosis not present

## 2020-09-01 DIAGNOSIS — M2550 Pain in unspecified joint: Secondary | ICD-10-CM | POA: Diagnosis not present

## 2020-09-02 ENCOUNTER — Encounter: Payer: Self-pay | Admitting: *Deleted

## 2020-09-02 ENCOUNTER — Ambulatory Visit: Payer: Medicare HMO | Admitting: *Deleted

## 2020-09-02 DIAGNOSIS — E782 Mixed hyperlipidemia: Secondary | ICD-10-CM

## 2020-09-02 DIAGNOSIS — M810 Age-related osteoporosis without current pathological fracture: Secondary | ICD-10-CM | POA: Diagnosis not present

## 2020-09-02 DIAGNOSIS — F411 Generalized anxiety disorder: Secondary | ICD-10-CM

## 2020-09-07 DIAGNOSIS — N3010 Interstitial cystitis (chronic) without hematuria: Secondary | ICD-10-CM | POA: Diagnosis not present

## 2020-09-08 DIAGNOSIS — N3010 Interstitial cystitis (chronic) without hematuria: Secondary | ICD-10-CM | POA: Diagnosis not present

## 2020-09-09 DIAGNOSIS — N3010 Interstitial cystitis (chronic) without hematuria: Secondary | ICD-10-CM | POA: Diagnosis not present

## 2020-09-10 ENCOUNTER — Ambulatory Visit: Payer: Medicare HMO | Admitting: Family Medicine

## 2020-09-11 DIAGNOSIS — N3010 Interstitial cystitis (chronic) without hematuria: Secondary | ICD-10-CM | POA: Diagnosis not present

## 2020-09-21 ENCOUNTER — Other Ambulatory Visit: Payer: Self-pay

## 2020-09-21 ENCOUNTER — Other Ambulatory Visit: Payer: Medicare HMO

## 2020-09-22 DIAGNOSIS — Z86010 Personal history of colonic polyps: Secondary | ICD-10-CM | POA: Diagnosis not present

## 2020-09-22 DIAGNOSIS — D509 Iron deficiency anemia, unspecified: Secondary | ICD-10-CM | POA: Diagnosis not present

## 2020-09-22 DIAGNOSIS — K5730 Diverticulosis of large intestine without perforation or abscess without bleeding: Secondary | ICD-10-CM | POA: Diagnosis not present

## 2020-09-22 DIAGNOSIS — K5904 Chronic idiopathic constipation: Secondary | ICD-10-CM | POA: Diagnosis not present

## 2020-09-24 DIAGNOSIS — M5137 Other intervertebral disc degeneration, lumbosacral region: Secondary | ICD-10-CM | POA: Diagnosis not present

## 2020-09-24 DIAGNOSIS — M9901 Segmental and somatic dysfunction of cervical region: Secondary | ICD-10-CM | POA: Diagnosis not present

## 2020-09-24 DIAGNOSIS — M9903 Segmental and somatic dysfunction of lumbar region: Secondary | ICD-10-CM | POA: Diagnosis not present

## 2020-09-24 DIAGNOSIS — M9902 Segmental and somatic dysfunction of thoracic region: Secondary | ICD-10-CM | POA: Diagnosis not present

## 2020-09-24 NOTE — Chronic Care Management (AMB) (Signed)
Chronic Care Management   CCM RN Visit Note  09/02/2020 Name: Kristy Garza MRN: [id number] DOB: 12/29/1950  Subjective: Kristy Garza is a 70 y.o. year old female who is a primary care patient of [name], DO. The care management team was consulted for assistance with disease management and care coordination needs.    Engaged with patient by telephone for follow up visit in response to provider referral for case management and/or care coordination services.   Consent to Services:  The patient was given information about Chronic Care Management services, agreed to services, and gave verbal consent prior to initiation of services.  Please see initial visit note for detailed documentation.   Patient agreed to services and verbal consent obtained.   Assessment: Review of patient past medical history, allergies, medications, health status, including review of consultants reports, laboratory and other test data, was performed as part of comprehensive evaluation and provision of chronic care management services.   SDOH (Social Determinants of Health) assessments and interventions performed:    CCM Care Plan  Allergies  Allergen Reactions   Cefuroxime Axetil Other (See Comments)    Extreme gas   Gabapentin Other (See Comments)    Constipation Constipation   Montelukast Other (See Comments)    Numbness and tingling in hand. Numbness and tingling in hand. Numbness and tingling in hand. NUMBNESS OF HANDS AND FEET. Numbness in hands and feet NUMBNESS OF HANDS AND FEET.   Pregabalin Other (See Comments)    Drowsiness, dry mouth Drowsiness, dry mouth Makes her tired and thirsty   Augmentin [Amoxicillin-Pot Clavulanate] Diarrhea   Avelox [Moxifloxacin Hcl In Nacl] Other (See Comments)    Tremors    Biaxin [Clarithromycin]     Unsure of reaction   Cedax [Ceftibuten] Other (See Comments)    tremors   Ciprofloxacin Other (See Comments) and Nausea And Vomiting    Blurred  vision Blurred vision Pt can't remember reaction   Clindamycin/Lincomycin     tachycardia   Levofloxacin Other (See Comments)    Feels dehydrated   Lincomycin Other (See Comments)    tachycardia   Montelukast Sodium Other (See Comments)    Numbness and tingling in hand. Numbness and tingling in hand.   Sulfonamide Derivatives Other (See Comments)    Gi upset   Sulfa Antibiotics Nausea Only    Other reaction(s): Other (See Comments) Gi upset  Other reaction(s): Other (See Comments) Gi upset Other reaction(s): Unknown    Outpatient Encounter Medications as of 09/02/2020  Medication Sig Note   acetaminophen (TYLENOL) 500 MG tablet Take by mouth. 01/07/2016: Received from: [facility] Received Sig: Take by mouth.   albuterol (VENTOLIN HFA) 108 (90 Base) MCG/ACT inhaler Use 2 puffs every 4 hours as needed for cough, wheeze, tightness in chest, or shortness of breath    b complex vitamins capsule Take 1 capsule by mouth daily.    budesonide-formoterol (SYMBICORT) 80-4.5 MCG/ACT inhaler Inhale 2 puffs into the lungs in the morning and at bedtime. with spacer and rinse mouth afterwards.    dextromethorphan-guaiFENesin (MUCINEX DM) 30-600 MG 12hr tablet Take 1 tablet by mouth 2 (two) times daily.    ipratropium (ATROVENT) 0.06 % nasal spray Place 2 sprays into both nostrils 3 (three) times daily as needed (nasal drainage).    lidocaine (XYLOCAINE) 2 % jelly APPLY TOPICALLY TO AFFECTED AREA EVERY DAY AS NEEDED (use sparingly)    LORazepam (ATIVAN) 0.5 MG tablet Take 1 tablet (0.5 mg total)  by mouth 2 (two) times daily as needed for anxiety.    magic mouthwash w/lidocaine SOLN Gargle and spit 36mL every 6 hours as needed for sore throat. 48mL lidocaine, 63mL nystatin, 60mg  hydrocortisone tab, qs benadryl total 44mL.    mometasone (NASONEX) 50 MCG/ACT nasal spray PLACE 2 SPRAYS INTO THE NOSE DAILY.    nystatin (MYCOSTATIN) 100000 UNIT/ML suspension Take 5 mLs (500,000  Units total) by mouth 4 (four) times daily. Swish and swallow    pantoprazole (PROTONIX) 40 MG tablet Take 1 tablet (40 mg total) by mouth daily.    phenylephrine (SUDAFED PE) 10 MG TABS tablet Take 10 mg by mouth every 4 (four) hours as needed.    Polyethylene Glycol 3350 (MIRALAX PO) Take by mouth as needed.    Probiotic Product (PROBIOTIC PO) Take by mouth daily.    Simethicone (GAS-X PO) Take by mouth.    tizanidine (ZANAFLEX) 2 MG capsule Take 1-2 capsules (2-4 mg total) by mouth 3 (three) times daily as needed for muscle spasms.    traMADol (ULTRAM) 50 MG tablet Take 1 tablet (50 mg total) by mouth every 12 (twelve) hours as needed.    UNABLE TO FIND Take 1 capsule by mouth daily. Dv3- vitamin D 3 plus immune support    Facility-Administered Encounter Medications as of 09/02/2020  Medication   zoledronic acid (RECLAST) injection 5 mg    Patient Active Problem List   Diagnosis Date Noted   Moderate persistent asthma without complication 01/60/1093   Raynaud phenomenon 06/16/2020   Tear film insufficiency 06/16/2020   Unspecified behavioral syndromes associated with physiological disturbances and physical factors 06/16/2020   Inflammatory arthritis 06/16/2020   Iron deficiency anemia, unspecified 04/17/2020   Age-related osteoporosis without current pathological fracture 01/04/2019   History of adenomatous polyp of colon 07/02/2018   Malnutrition of moderate degree ([place]) 05/23/2018   Adult BMI <19 kg/sq m 04/13/2018   Incontinence of feces 04/12/2018   Pharyngitis 01/09/2018   Anxiety state 01/19/2016   Adjustment disorder with anxious mood 01/19/2016   HLD (hyperlipidemia) 01/07/2016   IBS (irritable bowel syndrome) 07/11/2015   Chronic constipation 06/22/2015   Chronic bronchitis ([place]) 05/07/2015   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   Trigeminal neuralgia 04/17/2014   Face pain 01/22/2014   Bruxism, sleep-related 08/28/2013   Asthma, chronic 05/13/2013   Generalized anxiety  disorder 05/13/2013   Nonallergic rhinitis 05/13/2013   Difficulty speaking 04/11/2013   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   Leg cramps 03/23/2013   Acid reflux 03/11/2013   Osteoporosis 01/30/2013   Abdominal pain 01/16/2013   Back ache 01/16/2013   Chronic interstitial cystitis 01/16/2013   FOM (frequency of micturition) 01/16/2013   Seasonal allergic rhinitis 07/07/2012   G E R D 08/15/2007   VOCAL CORD POLYP, HX OF 08/15/2007    Conditions to be addressed/monitored:Anxiety and osteoporosis  Care Plan : RNCM Care Plan     Problem: Chronic Disease Management Needs (osteoporosis, anxiety)      Long-Range Goal: Work with [facility] Re: Chronic Disease Management Needs   Start Date: 09/02/2020  This Visit's Progress: Not on track  Priority: Medium  Note:   Current Barriers:  Knowledge Deficits related to osteoporosis management Chronic Disease Management support and education needs related to osteoporosis Overdue or Reclast infusion anxiety  RNCM Clinical Goal(s):  Patient will verbalize understanding of plan for management of osteoporosis  through collaboration with RN Care manager, provider, and care team.  Patient will work with Forestine Na Scheduling to  set up an appointment for Reclast infusion  Interventions: 1:1 collaboration with [name], DO regarding development and update of comprehensive plan of care as evidenced by provider attestation and co-signature Inter-disciplinary care team collaboration (see longitudinal plan of care)   Osteoporosis Evaluation of current treatment plan related to  osteoporosis ,  need for Reclast injection , self-management and patient's adherence to plan as established by provider. Discussed plans with patient for ongoing care management follow up and provided patient with direct contact information for care management team Advised patient to expect a call from Forestine Na to schedule her Reclast infusion Collaborated  with Blair Hailey at [facility] regarding scheduling of Reclast infusion She will reach out to patient directly to schedule  Patient Goals/Self-Care Activities: Patient will attend all scheduled provider appointments Patient will continue to perform ADL's independently Patient will continue to perform IADL's independently Patient will call provider office for new concerns or questions Schedule appointment at [facility] for [place] infusion Call RN Care Manager as needed [phone]  Follow Up Plan:  Telephone follow up appointment with care management team member scheduled for: 11/04/20 with [facility] Appointment for Reclast infusion pending      Plan:Telephone follow up appointment with care management team member scheduled for:  11/04/20 with RNCM  Chong Sicilian, BSN, RN-BC [place] / [place] Management Direct Dial: [phone]

## 2020-09-24 NOTE — Patient Instructions (Signed)
Visit Information  PATIENT GOALS:  Goals Addressed             This Visit's Progress    Work with RNCM Re: Chronic Disease Management Needs   On track    Timeframe:  Long-Range Goal Priority:  Medium Start Date:                             Expected End Date:                       Follow-up: 11/04/20  Patient will attend all scheduled provider appointments Patient will continue to perform ADL's independently Patient will continue to perform IADL's independently Patient will call provider office for new concerns or questions Schedule appointment at [facility] for [place] infusion Call RN Care Manager as needed [phone]          Patient verbalizes understanding of instructions provided today and agrees to view in [place].   Telephone follow up appointment with care management team member scheduled for: 11/04/20 with RNCM  Chong Sicilian, BSN, RN-BC [place] / [place] Management Direct Dial: [phone]

## 2020-09-29 ENCOUNTER — Encounter (HOSPITAL_COMMUNITY)
Admission: RE | Admit: 2020-09-29 | Discharge: 2020-09-29 | Disposition: A | Discharge status: Home / Self Care | Payer: Medicare HMO | Source: Ambulatory Visit | Attending: Family Medicine | Admitting: Family Medicine

## 2020-09-29 ENCOUNTER — Other Ambulatory Visit: Payer: Self-pay

## 2020-09-29 DIAGNOSIS — M810 Age-related osteoporosis without current pathological fracture: Secondary | ICD-10-CM | POA: Diagnosis not present

## 2020-09-29 MED ORDER — ZOLEDRONIC ACID 5 MG/100ML IV SOLN
5.0000 mg | Freq: Once | INTRAVENOUS | Status: AC
  Administered 2020-09-29: 5 mg via INTRAVENOUS

## 2020-09-29 MED ORDER — SODIUM CHLORIDE 0.9 % IV SOLN: INTRAVENOUS | Status: DC

## 2020-09-29 MED ORDER — ZOLEDRONIC ACID 5 MG/100ML IV SOLN
INTRAVENOUS | Status: AC
  Filled 2020-09-29: qty 100

## 2020-10-02 DIAGNOSIS — J310 Chronic rhinitis: Secondary | ICD-10-CM | POA: Diagnosis not present

## 2020-10-02 DIAGNOSIS — K146 Glossodynia: Secondary | ICD-10-CM | POA: Diagnosis not present

## 2020-10-02 DIAGNOSIS — K219 Gastro-esophageal reflux disease without esophagitis: Secondary | ICD-10-CM | POA: Diagnosis not present

## 2020-10-13 ENCOUNTER — Telehealth: Payer: Medicare HMO

## 2020-10-23 ENCOUNTER — Telehealth: Payer: Self-pay | Admitting: Family Medicine

## 2020-10-26 DIAGNOSIS — D508 Other iron deficiency anemias: Secondary | ICD-10-CM | POA: Diagnosis not present

## 2020-10-26 DIAGNOSIS — D509 Iron deficiency anemia, unspecified: Secondary | ICD-10-CM | POA: Diagnosis not present

## 2020-10-28 DIAGNOSIS — M5137 Other intervertebral disc degeneration, lumbosacral region: Secondary | ICD-10-CM | POA: Diagnosis not present

## 2020-10-28 DIAGNOSIS — M9903 Segmental and somatic dysfunction of lumbar region: Secondary | ICD-10-CM | POA: Diagnosis not present

## 2020-10-28 DIAGNOSIS — M9901 Segmental and somatic dysfunction of cervical region: Secondary | ICD-10-CM | POA: Diagnosis not present

## 2020-10-28 DIAGNOSIS — M9902 Segmental and somatic dysfunction of thoracic region: Secondary | ICD-10-CM | POA: Diagnosis not present

## 2020-11-02 ENCOUNTER — Ambulatory Visit: Payer: Self-pay | Admitting: Family Medicine

## 2020-11-04 ENCOUNTER — Other Ambulatory Visit: Payer: Self-pay

## 2020-11-04 ENCOUNTER — Encounter: Payer: Self-pay | Admitting: Family Medicine

## 2020-11-04 ENCOUNTER — Ambulatory Visit (INDEPENDENT_AMBULATORY_CARE_PROVIDER_SITE_OTHER): Payer: Medicare HMO | Admitting: Family Medicine

## 2020-11-04 ENCOUNTER — Ambulatory Visit (INDEPENDENT_AMBULATORY_CARE_PROVIDER_SITE_OTHER): Payer: Medicare HMO | Admitting: *Deleted

## 2020-11-04 VITALS — BP 128/77 | HR 89 | Temp 98.6°F | Ht 68.5 in | Wt 112.2 lb

## 2020-11-04 DIAGNOSIS — W57XXXA Bitten or stung by nonvenomous insect and other nonvenomous arthropods, initial encounter: Secondary | ICD-10-CM | POA: Diagnosis not present

## 2020-11-04 DIAGNOSIS — S70361A Insect bite (nonvenomous), right thigh, initial encounter: Secondary | ICD-10-CM

## 2020-11-04 DIAGNOSIS — M810 Age-related osteoporosis without current pathological fracture: Secondary | ICD-10-CM

## 2020-11-04 DIAGNOSIS — F411 Generalized anxiety disorder: Secondary | ICD-10-CM

## 2020-11-04 DIAGNOSIS — R300 Dysuria: Secondary | ICD-10-CM | POA: Diagnosis not present

## 2020-11-04 DIAGNOSIS — R21 Rash and other nonspecific skin eruption: Secondary | ICD-10-CM | POA: Diagnosis not present

## 2020-11-04 LAB — URINALYSIS
Bilirubin, UA: NEGATIVE
Glucose, UA: NEGATIVE
Ketones, UA: NEGATIVE
Leukocytes,UA: NEGATIVE
Nitrite, UA: NEGATIVE
Protein,UA: NEGATIVE
RBC, UA: NEGATIVE
Specific Gravity, UA: <1.005 — ABNORMAL LOW (ref 1.005–1.030)
Urobilinogen, Ur: 0.2 mg/dL (ref 0.2–1.0)
pH, UA: 7 (ref 5.0–7.5)

## 2020-11-04 MED ORDER — TRIAMCINOLONE ACETONIDE 0.1 % EX CREA: 1.0000 "application " | TOPICAL_CREAM | Freq: Two times a day (BID) | CUTANEOUS | 0 refills | Status: DC

## 2020-11-04 MED ORDER — DOXYCYCLINE HYCLATE 100 MG PO TABS: 100.0000 mg | ORAL_TABLET | Freq: Two times a day (BID) | ORAL | 0 refills | Status: AC

## 2020-11-04 NOTE — Progress Notes (Signed)
Subjective:  Patient ID: Kristy Garza, female    DOB: 1950/10/28, 70 y.o.   MRN: [id number]  Patient Care Team: [name], DO as PCP - General (Family Medicine) Deneise Lever, MD as Consulting Physician (Pulmonary Disease) Minus Breeding, MD as Consulting Physician (Cardiology) Shea Evans [name], LCSW as Social Worker (Licensed Clinical Social Worker) Ilean China, RN as Case Scientist, physiological Complaint:  Red bumps all over body and Dysuria   HPI: Kristy Garza is a 70 y.o. female presenting on 11/04/2020 for Red bumps all over body and Dysuria   Pt presents today with complaints of red bumps to her upper legs and groin. States this started several days ago. She states the areas are very pruritic and erythematous. She denies known new exposure. She states she also has slight dysuria. No hematuria, flank pain, fever, chills, confusion, or weakness.   Dysuria  This is a new problem. The current episode started in the past 7 days. The problem occurs intermittently. The problem has been waxing and waning. The quality of the pain is described as burning. The pain is at a severity of 2/10. The pain is mild. There has been no fever. She is Not sexually active. There is No history of pyelonephritis. Pertinent negatives include no chills, discharge, flank pain, frequency, hematuria, hesitancy, nausea, possible pregnancy, sweats, urgency or vomiting. She has tried nothing for the symptoms.    Relevant past medical, surgical, family, and social history reviewed and updated as indicated.  Allergies and medications reviewed and updated. Date reviewed: Chart in Epic.   Past Medical History:  Diagnosis Date   Acid reflux    Allergy    Arthritis    ARTHRITIS IN NECK BY DR. Alroy Dust [facility]   Asthma    Interstitial cystitis    Osteoporosis    PONV (postoperative nausea and vomiting)    Tinnitus    Trigeminal neuralgia    Atypical trigeminal neuralgia    Past Surgical  History:  Procedure Laterality Date   ABDOMINAL HYSTERECTOMY  2000   CYSTOSCOPY WITH HYDRODISTENSION AND BIOPSY N/A 06/11/2012   Procedure: CYSTOSCOPY/BIOPSY/HYDRODISTENSION with Instillation of Pyridium and Marcaine and Kenalog;  Surgeon: Ailene Rud, MD;  Location: [facility];  Service: Urology;  Laterality: N/A;  1 hour requested for this case  BLADDER BIOPSY   ETHMOIDECTOMY  2012   SEPTOPLASTY  1980's   vocal cord surgery   1990's   polyp removal    Social History   Socioeconomic History   Marital status: Married    Spouse name: [name]   Number of children: 3   Years of education: 12   Highest education level: Some college, no degree  Occupational History   Occupation: CNA    Comment: Retired  Tobacco Use   Smoking status: Never   Smokeless tobacco: Never  Vaping Use   Vaping Use: Never used  Substance and Sexual Activity   Alcohol use: No    Alcohol/week: 0.0 standard drinks    Comment: 01-19-2016 per pt no   Drug use: No    Comment: 01-19-2016 per pt no    Sexual activity: Not Currently    Birth control/protection: Surgical  Other Topics Concern   Not on file  Social History Narrative   Lives with husband.    Social Determinants of Health   Financial Resource Strain: Not on file  Food Insecurity: Not on file  Transportation Needs: Not on file  Physical Activity: Not on file  Stress: Not on file  Social Connections: Not on file  Intimate Partner Violence: Not on file    Outpatient Encounter Medications as of 11/04/2020  Medication Sig   acetaminophen (TYLENOL) 500 MG tablet Take by mouth.   albuterol (VENTOLIN HFA) 108 (90 Base) MCG/ACT inhaler Use 2 puffs every 4 hours as needed for cough, wheeze, tightness in chest, or shortness of breath   aspirin-acetaminophen-caffeine (EXCEDRIN MIGRAINE) 250-250-65 MG tablet Take by mouth.   b complex vitamins capsule Take 1 capsule by mouth daily.   BREO ELLIPTA 100-25 MCG/INH AEPB Inhale 1  puff into the lungs daily.   budesonide-formoterol (SYMBICORT) 80-4.5 MCG/ACT inhaler Inhale 2 puffs into the lungs in the morning and at bedtime. with spacer and rinse mouth afterwards.   calcium citrate-vitamin D (CITRACAL+D) 315-200 MG-UNIT tablet Take by mouth.   Cholecalciferol 25 MCG (1000 UT) tablet Take by mouth.   cycloSPORINE (RESTASIS) 0.05 % ophthalmic emulsion PLACE ONE DROP INTO BOTH EYES TWICE A DAY   Dermatological Products, Misc. (SUVICORT) EMUL 1 application as needed   dextromethorphan-guaiFENesin (MUCINEX DM) 30-600 MG 12hr tablet Take 1 tablet by mouth 2 (two) times daily.   DHEA 25 MG CAPS Take by mouth.   doxycycline (VIBRA-TABS) 100 MG tablet Take 1 tablet (100 mg total) by mouth 2 (two) times daily for 10 days. 1 po bid   fexofenadine (ALLEGRA) 60 MG tablet Take by mouth.   fluconazole (DIFLUCAN) 100 MG tablet TAKE 1/2 TABLET BY MOUTH ON MONDAY, WEDNESDAY, AND FRIDAY ONLY   ipratropium (ATROVENT) 0.06 % nasal spray Place 2 sprays into both nostrils 3 (three) times daily as needed (nasal drainage).   ipratropium (ATROVENT) 0.06 % nasal spray 2 sprays.   lidocaine (XYLOCAINE) 2 % jelly APPLY TOPICALLY TO AFFECTED AREA EVERY DAY AS NEEDED (use sparingly)   lidocaine (XYLOCAINE) 2 % jelly Apply topically daily as needed.   LORazepam (ATIVAN) 0.5 MG tablet Take 1 tablet (0.5 mg total) by mouth 2 (two) times daily as needed for anxiety.   LORazepam (ATIVAN) 0.5 MG tablet Take by mouth.   magic mouthwash w/lidocaine SOLN Gargle and spit 49m every 6 hours as needed for sore throat. 664mlidocaine, 6038mystatin, '60mg'$  hydrocortisone tab, qs benadryl total 480m98m magnesium oxide (MAG-OX) 400 MG tablet Take by mouth.   mometasone (NASONEX) 50 MCG/ACT nasal spray PLACE 2 SPRAYS INTO THE NOSE DAILY.   nystatin (MYCOSTATIN) 100000 UNIT/ML suspension Take 5 mLs (500,000 Units total) by mouth 4 (four) times daily. Swish and swallow   pantoprazole (PROTONIX) 40 MG tablet Take 1 tablet  (40 mg total) by mouth daily.   phenylephrine (SUDAFED PE) 10 MG TABS tablet Take 10 mg by mouth every 4 (four) hours as needed.   Plecanatide (TRULANCE) 3 MG TABS 1 tablet   Polyethylene Glycol 3350 (MIRALAX PO) Take by mouth as needed.   Probiotic Product (PROBIOTIC PO) Take by mouth daily.   Simethicone (GAS-X PO) Take by mouth.   tizanidine (ZANAFLEX) 2 MG capsule Take 1-2 capsules (2-4 mg total) by mouth 3 (three) times daily as needed for muscle spasms.   traMADol (ULTRAM) 50 MG tablet Take 1 tablet (50 mg total) by mouth every 12 (twelve) hours as needed.   triamcinolone cream (KENALOG) 0.1 % Apply 1 application topically 2 (two) times daily.   UNABLE TO FIND Take 1 capsule by mouth daily. Dv3- vitamin D 3 plus immune support   Facility-Administered Encounter Medications as of  11/04/2020  Medication   zoledronic acid (RECLAST) injection 5 mg    Allergies  Allergen Reactions   Cefuroxime Axetil Other (See Comments)    Extreme gas   Gabapentin Other (See Comments)    Constipation Constipation   Montelukast Other (See Comments)    Numbness and tingling in hand. Numbness and tingling in hand. Numbness and tingling in hand. NUMBNESS OF HANDS AND FEET. Numbness in hands and feet NUMBNESS OF HANDS AND FEET.   Pregabalin Other (See Comments)    Drowsiness, dry mouth Drowsiness, dry mouth Makes her tired and thirsty   Augmentin [Amoxicillin-Pot Clavulanate] Diarrhea   Avelox [Moxifloxacin Hcl In Nacl] Other (See Comments)    Tremors    Biaxin [Clarithromycin]     Unsure of reaction   Cedax [Ceftibuten] Other (See Comments)    tremors   Ciprofloxacin Other (See Comments) and Nausea And Vomiting    Blurred vision Blurred vision Pt can't remember reaction   Clindamycin/Lincomycin     tachycardia   Levofloxacin Other (See Comments)    Feels dehydrated   Lincomycin Other (See Comments)    tachycardia   Montelukast Sodium Other (See Comments)    Numbness and tingling in  hand. Numbness and tingling in hand.   Sulfonamide Derivatives Other (See Comments)    Gi upset   Sulfa Antibiotics Nausea Only    Other reaction(s): Other (See Comments) Gi upset  Other reaction(s): Other (See Comments) Gi upset Other reaction(s): Unknown    Review of Systems  Constitutional:  Negative for activity change, appetite change, chills, diaphoresis, fatigue, fever and unexpected weight change.  HENT: Negative.    Eyes: Negative.   Respiratory:  Negative for cough, chest tightness and shortness of breath.   Cardiovascular:  Negative for chest pain, palpitations and leg swelling.  Gastrointestinal:  Negative for abdominal distention, abdominal pain, anal bleeding, blood in stool, constipation, diarrhea, nausea and vomiting.  Endocrine: Negative.   Genitourinary:  Positive for dysuria. Negative for decreased urine volume, difficulty urinating, dyspareunia, enuresis, flank pain, frequency, genital sores, hematuria, hesitancy, menstrual problem, pelvic pain, urgency, vaginal bleeding, vaginal discharge and vaginal pain.  Musculoskeletal:  Negative for arthralgias and myalgias.  Skin:  Positive for color change and rash. Negative for pallor and wound.  Allergic/Immunologic: Negative.   Neurological:  Negative for dizziness, weakness and headaches.  Hematological: Negative.   Psychiatric/Behavioral:  Negative for confusion, hallucinations, sleep disturbance and suicidal ideas.   All other systems reviewed and are negative.      Objective:  BP 128/77   Pulse 89   Temp 98.6 F (37 C) (Temporal)   Ht 5' 8.5" (1.74 m)   Wt 112 lb 3.2 oz (50.9 kg)   SpO2 100%   BMI 16.81 kg/m    Wt Readings from Last 3 Encounters:  11/04/20 112 lb 3.2 oz (50.9 kg)  06/16/20 111 lb 8 oz (50.6 kg)  06/15/20 113 lb (51.3 kg)    Physical Exam Vitals and nursing note reviewed.  Constitutional:      General: She is not in acute distress.    Appearance: Normal appearance. She is  well-developed, well-groomed and underweight. She is not ill-appearing, toxic-appearing or diaphoretic.  HENT:     Head: Normocephalic and atraumatic.     Jaw: There is normal jaw occlusion.     Right Ear: Hearing normal.     Left Ear: Hearing normal.     Nose: Nose normal.     Mouth/Throat:     Lips:  Pink.     Mouth: Mucous membranes are moist.     Pharynx: Oropharynx is clear. Uvula midline.  Eyes:     General: Lids are normal.     Extraocular Movements: Extraocular movements intact.     Conjunctiva/sclera: Conjunctivae normal.     Pupils: Pupils are equal, round, and reactive to light.  Neck:     Thyroid: No thyroid mass, thyromegaly or thyroid tenderness.     Vascular: No carotid bruit or JVD.     Trachea: Trachea and phonation normal.  Cardiovascular:     Rate and Rhythm: Normal rate and regular rhythm.     Chest Wall: PMI is not displaced.     Pulses: Normal pulses.     Heart sounds: Normal heart sounds. No murmur heard.   No friction rub. No gallop.  Pulmonary:     Effort: Pulmonary effort is normal. No respiratory distress.     Breath sounds: Normal breath sounds. No wheezing.  Abdominal:     General: Bowel sounds are normal. There is no distension or abdominal bruit.     Palpations: Abdomen is soft. There is no hepatomegaly or splenomegaly.     Tenderness: There is no abdominal tenderness. There is no right CVA tenderness or left CVA tenderness.     Hernia: No hernia is present.  Musculoskeletal:        General: Normal range of motion.     Cervical back: Normal range of motion and neck supple.     Right lower leg: No edema.     Left lower leg: No edema.  Lymphadenopathy:     Cervical: No cervical adenopathy.  Skin:    General: Skin is warm and dry.     Capillary Refill: Capillary refill takes less than 2 seconds.     Coloration: Skin is not cyanotic, jaundiced or pale.     Findings: Erythema and rash present.          Comments: Upon evaluation, three embedded  ticks noted and removed using forceps. Surrounding erythema without purulent drainage.   Neurological:     General: No focal deficit present.     Mental Status: She is alert and oriented to person, place, and time.     Cranial Nerves: Cranial nerves are intact. No cranial nerve deficit.     Sensory: Sensation is intact. No sensory deficit.     Motor: Motor function is intact. No weakness.     Coordination: Coordination is intact. Coordination normal.     Gait: Gait is intact. Gait normal.     Deep Tendon Reflexes: Reflexes are normal and symmetric. Reflexes normal.  Psychiatric:        Attention and Perception: Attention and perception normal.        Mood and Affect: Mood and affect normal.        Speech: Speech normal.        Behavior: Behavior normal. Behavior is cooperative.        Thought Content: Thought content normal.        Cognition and Memory: Cognition and memory normal.        Judgment: Judgment normal.  Foreign Body Removal  Date/Time: 11/04/2020 2:03 PM Performed by: Baruch Gouty, FNP Authorized by: Baruch Gouty, FNP  Body area: skin General location: lower extremity Location details: right upper leg  Sedation: Patient sedated: no  Patient restrained: no Patient cooperative: yes Complexity: simple 3 objects recovered. Objects recovered: two ticks removed from right upper  leg, 1 tick removed from left upper leg Post-procedure assessment: foreign body removed Patient tolerance: patient tolerated the procedure well with no immediate complications    Results for orders placed or performed in visit on 11/04/20  Urinalysis  Result Value Ref Range   Specific Gravity, UA <1.005 (L) 1.005 - 1.030   pH, UA 7.0 5.0 - 7.5   Color, UA Yellow Yellow   Appearance Ur Clear Clear   Leukocytes,UA Negative Negative   Protein,UA Negative Negative/Trace   Glucose, UA Negative Negative   Ketones, UA Negative Negative   RBC, UA Negative Negative   Bilirubin, UA Negative  Negative   Urobilinogen, Ur 0.2 0.2 - 1.0 mg/dL   Nitrite, UA Negative Negative     Urinalysis in office unremarkable, culture pending.   Pertinent labs & imaging results that were available during my care of the patient were reviewed by me and considered in my medical decision making.  Assessment & Plan:  Kristy Garza was seen today for red bumps all over body and dysuria.  Diagnoses and all orders for this visit:  Tick bite of right thigh, initial encounter Tick bite with subsequent removal of tick Three ticks removed during visit without complications. Will empirically start doxycycline. Labs pending.  -     doxycycline (VIBRA-TABS) 100 MG tablet; Take 1 tablet (100 mg total) by mouth 2 (two) times daily for 10 days. 1 po bid -     Rocky mtn spotted fvr abs pnl(IgG+IgM) -     Lyme Disease Serology w/Reflex -     Foreign Body Removal  Rash Pruritic and erythematous rash at sites of tick bites, doxycycline initiated. Triamcinolone cream as needed for pruritis.  -     doxycycline (VIBRA-TABS) 100 MG tablet; Take 1 tablet (100 mg total) by mouth 2 (two) times daily for 10 days. 1 po bid -     Rocky mtn spotted fvr abs pnl(IgG+IgM) -     Lyme Disease Serology w/Reflex -     triamcinolone cream (KENALOG) 0.1 %; Apply 1 application topically 2 (two) times daily.  Dysuria Urinalysis in office unremarkable. Culture pending, will treat if warranted.  -     Urine Culture -     Urinalysis    Continue all other maintenance medications.  Follow up plan: Return if symptoms worsen or fail to improve.   Continue healthy lifestyle choices, including diet (rich in fruits, vegetables, and lean proteins, and low in salt and simple carbohydrates) and exercise (at least 30 minutes of moderate physical activity daily).  Educational handout given for tick bite  The above assessment and management plan was discussed with the patient. The patient verbalized understanding of and has agreed to the  management plan. Patient is aware to call the clinic if they develop any new symptoms or if symptoms persist or worsen. Patient is aware when to return to the clinic for a follow-up visit. Patient educated on when it is appropriate to go to the emergency department.   Monia Pouch, FNP-C [facility] [phone]

## 2020-11-05 DIAGNOSIS — M9902 Segmental and somatic dysfunction of thoracic region: Secondary | ICD-10-CM | POA: Diagnosis not present

## 2020-11-05 DIAGNOSIS — M5137 Other intervertebral disc degeneration, lumbosacral region: Secondary | ICD-10-CM | POA: Diagnosis not present

## 2020-11-05 DIAGNOSIS — M9901 Segmental and somatic dysfunction of cervical region: Secondary | ICD-10-CM | POA: Diagnosis not present

## 2020-11-05 DIAGNOSIS — M9903 Segmental and somatic dysfunction of lumbar region: Secondary | ICD-10-CM | POA: Diagnosis not present

## 2020-11-05 NOTE — Chronic Care Management (AMB) (Signed)
Chronic Care Management   CCM RN Visit Note  11/04/2020 Name: Kristy Garza MRN: [id number] DOB: 11/22/1950  Subjective: Kristy Garza is a 70 y.o. year old female who is a primary care patient of [name], DO. The care management team was consulted for assistance with disease management and care coordination needs.    Engaged with patient by telephone for follow up visit in response to provider referral for case management and/or care coordination services.   Consent to Services:  The patient was given information about Chronic Care Management services, agreed to services, and gave verbal consent prior to initiation of services.  Please see initial visit note for detailed documentation.   Patient agreed to services and verbal consent obtained.   Assessment: Review of patient past medical history, allergies, medications, health status, including review of consultants reports, laboratory and other test data, was performed as part of comprehensive evaluation and provision of chronic care management services.   SDOH (Social Determinants of Health) assessments and interventions performed:    CCM Care Plan  Allergies  Allergen Reactions   Cefuroxime Axetil Other (See Comments)    Extreme gas   Gabapentin Other (See Comments)    Constipation Constipation   Montelukast Other (See Comments)    Numbness and tingling in hand. Numbness and tingling in hand. Numbness and tingling in hand. NUMBNESS OF HANDS AND FEET. Numbness in hands and feet NUMBNESS OF HANDS AND FEET.   Pregabalin Other (See Comments)    Drowsiness, dry mouth Drowsiness, dry mouth Makes her tired and thirsty   Augmentin [Amoxicillin-Pot Clavulanate] Diarrhea   Avelox [Moxifloxacin Hcl In Nacl] Other (See Comments)    Tremors    Biaxin [Clarithromycin]     Unsure of reaction   Cedax [Ceftibuten] Other (See Comments)    tremors   Ciprofloxacin Other (See Comments) and Nausea And Vomiting    Blurred  vision Blurred vision Pt can't remember reaction   Clindamycin/Lincomycin     tachycardia   Levofloxacin Other (See Comments)    Feels dehydrated   Lincomycin Other (See Comments)    tachycardia   Montelukast Sodium Other (See Comments)    Numbness and tingling in hand. Numbness and tingling in hand.   Sulfonamide Derivatives Other (See Comments)    Gi upset   Sulfa Antibiotics Nausea Only    Other reaction(s): Other (See Comments) Gi upset  Other reaction(s): Other (See Comments) Gi upset Other reaction(s): Unknown    Outpatient Encounter Medications as of 11/04/2020  Medication Sig Note   acetaminophen (TYLENOL) 500 MG tablet Take by mouth. 01/07/2016: Received from: [facility] Received Sig: Take by mouth.   albuterol (VENTOLIN HFA) 108 (90 Base) MCG/ACT inhaler Use 2 puffs every 4 hours as needed for cough, wheeze, tightness in chest, or shortness of breath    aspirin-acetaminophen-caffeine (EXCEDRIN MIGRAINE) 250-250-65 MG tablet Take by mouth.    b complex vitamins capsule Take 1 capsule by mouth daily.    BREO ELLIPTA 100-25 MCG/INH AEPB Inhale 1 puff into the lungs daily.    budesonide-formoterol (SYMBICORT) 80-4.5 MCG/ACT inhaler Inhale 2 puffs into the lungs in the morning and at bedtime. with spacer and rinse mouth afterwards.    calcium citrate-vitamin D (CITRACAL+D) 315-200 MG-UNIT tablet Take by mouth.    Cholecalciferol 25 MCG (1000 UT) tablet Take by mouth.    cycloSPORINE (RESTASIS) 0.05 % ophthalmic emulsion PLACE ONE DROP INTO BOTH EYES TWICE A DAY    Dermatological Products,  Misc. (SUVICORT) EMUL 1 application as needed    dextromethorphan-guaiFENesin (MUCINEX DM) 30-600 MG 12hr tablet Take 1 tablet by mouth 2 (two) times daily.    DHEA 25 MG CAPS Take by mouth.    doxycycline (VIBRA-TABS) 100 MG tablet Take 1 tablet (100 mg total) by mouth 2 (two) times daily for 10 days. 1 po bid    fexofenadine (ALLEGRA) 60 MG tablet Take by mouth.     fluconazole (DIFLUCAN) 100 MG tablet TAKE 1/2 TABLET BY MOUTH ON MONDAY, WEDNESDAY, AND FRIDAY ONLY    ipratropium (ATROVENT) 0.06 % nasal spray Place 2 sprays into both nostrils 3 (three) times daily as needed (nasal drainage).    ipratropium (ATROVENT) 0.06 % nasal spray 2 sprays.    lidocaine (XYLOCAINE) 2 % jelly APPLY TOPICALLY TO AFFECTED AREA EVERY DAY AS NEEDED (use sparingly)    lidocaine (XYLOCAINE) 2 % jelly Apply topically daily as needed.    LORazepam (ATIVAN) 0.5 MG tablet Take 1 tablet (0.5 mg total) by mouth 2 (two) times daily as needed for anxiety.    LORazepam (ATIVAN) 0.5 MG tablet Take by mouth.    magic mouthwash w/lidocaine SOLN Gargle and spit 80m every 6 hours as needed for sore throat. 664mlidocaine, 6053mystatin, '60mg'$  hydrocortisone tab, qs benadryl total 480m71m  magnesium oxide (MAG-OX) 400 MG tablet Take by mouth.    mometasone (NASONEX) 50 MCG/ACT nasal spray PLACE 2 SPRAYS INTO THE NOSE DAILY.    nystatin (MYCOSTATIN) 100000 UNIT/ML suspension Take 5 mLs (500,000 Units total) by mouth 4 (four) times daily. Swish and swallow    pantoprazole (PROTONIX) 40 MG tablet Take 1 tablet (40 mg total) by mouth daily.    phenylephrine (SUDAFED PE) 10 MG TABS tablet Take 10 mg by mouth every 4 (four) hours as needed.    Plecanatide (TRULANCE) 3 MG TABS 1 tablet    Polyethylene Glycol 3350 (MIRALAX PO) Take by mouth as needed.    Probiotic Product (PROBIOTIC PO) Take by mouth daily.    Simethicone (GAS-X PO) Take by mouth.    tizanidine (ZANAFLEX) 2 MG capsule Take 1-2 capsules (2-4 mg total) by mouth 3 (three) times daily as needed for muscle spasms.    traMADol (ULTRAM) 50 MG tablet Take 1 tablet (50 mg total) by mouth every 12 (twelve) hours as needed.    triamcinolone cream (KENALOG) 0.1 % Apply 1 application topically 2 (two) times daily.    UNABLE TO FIND Take 1 capsule by mouth daily. Dv3- vitamin D 3 plus immune support    Facility-Administered Encounter  Medications as of 11/04/2020  Medication   zoledronic acid (RECLAST) injection 5 mg    Patient Active Problem List   Diagnosis Date Noted   Moderate persistent asthma without complication [id number] phenomenon 06/16/2020   Tear film insufficiency 06/16/2020   Unspecified behavioral syndromes associated with physiological disturbances and physical factors 06/16/2020   Inflammatory arthritis 06/16/2020   Iron deficiency anemia, unspecified 04/17/2020   Age-related osteoporosis without current pathological fracture 01/04/2019   History of adenomatous polyp of colon 07/02/2018   Malnutrition of moderate degree (HCC)[place]/26/2020   Adult BMI <19 kg/sq m 04/13/2018   Incontinence of feces 04/12/2018   Pharyngitis 01/09/2018   Anxiety state 01/19/2016   Adjustment disorder with anxious mood 01/19/2016   HLD (hyperlipidemia) 01/07/2016   IBS (irritable bowel syndrome) 07/11/2015   Chronic constipation 06/22/2015   Chronic bronchitis (HCC)[place]/11/2015   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  Trigeminal neuralgia 04/17/2014   Face pain 01/22/2014   Bruxism, sleep-related 08/28/2013   Asthma, chronic 05/13/2013   Generalized anxiety disorder 05/13/2013   Nonallergic rhinitis 05/13/2013   Difficulty speaking 04/11/2013   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   Leg cramps 03/23/2013   Acid reflux 03/11/2013   Osteoporosis 01/30/2013   Abdominal pain 01/16/2013   Back ache 01/16/2013   Chronic interstitial cystitis 01/16/2013   FOM (frequency of micturition) 01/16/2013   Seasonal allergic rhinitis 07/07/2012   G E R D 08/15/2007   VOCAL CORD POLYP, HX OF 08/15/2007    Conditions to be addressed/monitored:Anxiety and osteoporosis  Care Plan : [place]  Updates made by Ilean China, RN since 11/05/2020 12:00 AM     Problem: Chronic Disease Management Needs (osteoporosis, anxiety)      Long-Range Goal: Work with [facility] Regarding Care Coordination and Care Management  Associated with Chronic Disease Management Needs   Start Date: 09/02/2020  This Visit's Progress: On track  Recent Progress: Not on track  Priority: Medium  Note:   Current Barriers:  Chronic Disease Management support and education needs related to Anxiety with Excessive Worry, and osteoporosis  RNCM Clinical Goal(s):  Patient will verbalize understanding of plan for management of Anxiety and osteoporosis demonstrate ongoing health management independence     Keep all medical appointments Take medications as prescribed  Interventions: 1:1 collaboration with primary care provider regarding development and update of comprehensive plan of care as evidenced by provider attestation and co-signature Inter-disciplinary care team collaboration (see longitudinal plan of care) Evaluation of current treatment plan related to  self management and patient's adherence to plan as established by provider General health and multiple body systems discussed Patient encouraged to follow-up with PCP and specialists Reviewed medications  Anemia:  (Status: Goal on track: YES.) Assessment of understanding of anemia/bleeding disorder diagnosis Basic overview and discussion of anemia/bleeding disorder or acute disease state Medications reviewed - Counseled on importance of regular laboratory monitoring as directed by provider; - recommended promotion of rest and energy-conserving measures to manage fatigue, such as balancing activity with periods of rest - encouraged strategies to prevent falls related to fatigue, weakness and dizziness; encouraged sitting before standing and using an assistive device - encouraged optimal oral intake to support fluid balance and nutrition - encouraged dietary changes to increase dietary intake of iron, Vitamin 123456 and folic acid as advised/prescribed - Assessed social determinant of health barriers;   Discussed iron supplement intake and constipation Encouraged to take with  vitamin C for absorption Reviewed OTC medications to help with constipation Encouraged intake of whole foods containing fiber and supplement if necessary. Adequate water intake needed with insoluable fiber supplementation.  Extensively discussed diet Extensively discussed recent lab results that were available for review in [facility] and normal hgb, b12, and iron levels  Osteoporosis(Status: Goal on track: YES.)  Chart reviewed including relevant office notes, lab results, and imaging reports Discussed reclast infusion at [facility] for management of osteoporosis  Reviewed appt for next year for 2nd infusion Discussed side effect of body caches experienced for a couple of days after last infusion Encouraged intake of calcium and vitamin D through diet and supplements Fall precautions reviewed and encouraged patient to move carefully to avoid falls Advised dexa scan is due at least every 2 years  Anxiety(Status: Goal on track: YES.)  Encouraged to talk with LCSW regarding psychosocial concerns Therapeutic listening utilized regarding health anxiety associated with chronic medical conditions, current  health state, recent specialty visits, and current symptoms with unknown causes  Patient Goals/Self-Care Activities: Patient will self administer medications as prescribed Patient will call pharmacy for medication refills Patient will continue to perform ADL's independently Patient will continue to perform IADL's independently Patient will call provider office for new concerns or questions       Plan:Telephone follow up appointment with care management team member scheduled for:  01/06/21 with RNCM and The patient has been provided with contact information for the care management team and has been advised to call with any health related questions or concerns.   Chong Sicilian, BSN, RN-BC Embedded Chronic Care Manager [facility] / [place] [facility] Direct Dial:  [phone]

## 2020-11-05 NOTE — Patient Instructions (Signed)
Visit Information  PATIENT GOALS:  Goals Addressed             This Visit's Progress    Work with RNCM Re: Chronic Disease Management Needs       Timeframe:  Long-Range Goal Priority:  Medium Start Date:                             Expected End Date:                       Follow-up: 01/06/21 with RNCM  Patient will attend all scheduled provider appointments Patient will continue to perform ADL's independently Patient will continue to perform IADL's independently Patient will call provider office for new concerns or questions Talk with LCSW regarding anxiety and psychosocial concerns Call RN Care Manager as needed [phone]         Patient verbalizes understanding of instructions provided today and agrees to view in [place].   Plan:Telephone follow up appointment with care management team member scheduled for:  01/06/21 with RNCM and The patient has been provided with contact information for the care management team and has been advised to call with any health related questions or concerns.   Chong Sicilian, BSN, RN-BC Embedded Chronic Care Manager [facility] [place] [facility] Direct Dial: [phone]

## 2020-11-06 LAB — URINE CULTURE

## 2020-11-07 LAB — ROCKY MTN SPOTTED FVR ABS PNL(IGG+IGM)
RMSF IgG: NEGATIVE
RMSF IgM: 0.48 index (ref 0.00–0.89)

## 2020-11-07 LAB — LYME DISEASE SEROLOGY W/REFLEX: Lyme Total Antibody EIA: NEGATIVE

## 2020-11-09 DIAGNOSIS — K219 Gastro-esophageal reflux disease without esophagitis: Secondary | ICD-10-CM | POA: Diagnosis not present

## 2020-11-09 DIAGNOSIS — J301 Allergic rhinitis due to pollen: Secondary | ICD-10-CM | POA: Diagnosis not present

## 2020-11-09 DIAGNOSIS — K5900 Constipation, unspecified: Secondary | ICD-10-CM | POA: Diagnosis not present

## 2020-11-09 DIAGNOSIS — H04129 Dry eye syndrome of unspecified lacrimal gland: Secondary | ICD-10-CM | POA: Diagnosis not present

## 2020-11-09 DIAGNOSIS — J449 Chronic obstructive pulmonary disease, unspecified: Secondary | ICD-10-CM | POA: Diagnosis not present

## 2020-11-09 DIAGNOSIS — L309 Dermatitis, unspecified: Secondary | ICD-10-CM | POA: Diagnosis not present

## 2020-11-09 DIAGNOSIS — R69 Illness, unspecified: Secondary | ICD-10-CM | POA: Diagnosis not present

## 2020-11-09 DIAGNOSIS — M1990 Unspecified osteoarthritis, unspecified site: Secondary | ICD-10-CM | POA: Diagnosis not present

## 2020-11-09 DIAGNOSIS — G8929 Other chronic pain: Secondary | ICD-10-CM | POA: Diagnosis not present

## 2020-11-09 DIAGNOSIS — D649 Anemia, unspecified: Secondary | ICD-10-CM | POA: Diagnosis not present

## 2020-11-10 ENCOUNTER — Ambulatory Visit (INDEPENDENT_AMBULATORY_CARE_PROVIDER_SITE_OTHER): Payer: Medicare HMO | Admitting: Family Medicine

## 2020-11-10 ENCOUNTER — Other Ambulatory Visit: Payer: Self-pay

## 2020-11-10 ENCOUNTER — Ambulatory Visit (INDEPENDENT_AMBULATORY_CARE_PROVIDER_SITE_OTHER): Payer: Medicare HMO

## 2020-11-10 VITALS — BP 107/68 | HR 67 | Temp 97.3°F | Ht 68.5 in | Wt 114.0 lb

## 2020-11-10 DIAGNOSIS — G8929 Other chronic pain: Secondary | ICD-10-CM | POA: Diagnosis not present

## 2020-11-10 DIAGNOSIS — R6889 Other general symptoms and signs: Secondary | ICD-10-CM | POA: Diagnosis not present

## 2020-11-10 DIAGNOSIS — E440 Moderate protein-calorie malnutrition: Secondary | ICD-10-CM | POA: Diagnosis not present

## 2020-11-10 DIAGNOSIS — F5101 Primary insomnia: Secondary | ICD-10-CM

## 2020-11-10 DIAGNOSIS — R69 Illness, unspecified: Secondary | ICD-10-CM | POA: Diagnosis not present

## 2020-11-10 DIAGNOSIS — K581 Irritable bowel syndrome with constipation: Secondary | ICD-10-CM

## 2020-11-10 DIAGNOSIS — R6884 Jaw pain: Secondary | ICD-10-CM | POA: Diagnosis not present

## 2020-11-10 DIAGNOSIS — M810 Age-related osteoporosis without current pathological fracture: Secondary | ICD-10-CM | POA: Diagnosis not present

## 2020-11-10 DIAGNOSIS — Z79899 Other long term (current) drug therapy: Secondary | ICD-10-CM

## 2020-11-10 DIAGNOSIS — F411 Generalized anxiety disorder: Secondary | ICD-10-CM

## 2020-11-10 DIAGNOSIS — Z79891 Long term (current) use of opiate analgesic: Secondary | ICD-10-CM | POA: Diagnosis not present

## 2020-11-10 LAB — HEMOGLOBIN, FINGERSTICK: Hemoglobin: 13.7 g/dL (ref 11.1–15.9)

## 2020-11-10 MED ORDER — DOXEPIN HCL 3 MG PO TABS: 3.0000 mg | ORAL_TABLET | Freq: Every evening | ORAL | 5 refills | Status: DC | PRN

## 2020-11-10 MED ORDER — LORAZEPAM 0.5 MG PO TABS: 0.5000 mg | ORAL_TABLET | Freq: Two times a day (BID) | ORAL | 1 refills | Status: DC | PRN

## 2020-11-10 MED ORDER — TRAMADOL HCL 50 MG PO TABS: 50.0000 mg | ORAL_TABLET | Freq: Two times a day (BID) | ORAL | 1 refills | Status: DC | PRN

## 2020-11-10 NOTE — Patient Instructions (Signed)

## 2020-11-10 NOTE — Progress Notes (Signed)
Subjective: CC: Follow-up anxiety, chronic pain PCP: [name], DO [id number] Kristy Garza is a 70 y.o. female presenting to clinic today for:  1.  Anxiety disorder/insomnia Patient continues to use Ativan intermittently but very sparingly for breakthrough panic attacks.  Denies any excessive daytime sedation, falls or respiratory depression.  She continues to have difficulty with sleep and asked for assistance with that.  She does not do anything that would make her feel hung over the next day  2.  Chronic neck pain and jaw pain Patient with ongoing chronic pain issues.  She also struggles with chronic bladder pain and takes AZO for this.  Uses the tramadol intermittently.  She has chronic IBS constipation but does not feel that symptoms are exacerbated by tramadol.  No falls, respiratory depression  3.  Osteoporosis/malnutrition Asking what doses of calcium and vitamin D supplements to take.  She still limits her dairy intake due to lactose intolerance.  She only eats very specific foods that do not upset her IBS.  She got her first dose of Reclast and so far is tolerating this without difficulty.  Has considered looking for dairy free boost/Ensure as she recognizes that she is not at a healthy weight.    ROS: Per HPI  Allergies  Allergen Reactions   Cefuroxime Axetil Other (See Comments)    Extreme gas   Gabapentin Other (See Comments)    Constipation Constipation   Montelukast Other (See Comments)    Numbness and tingling in hand. Numbness and tingling in hand. Numbness and tingling in hand. NUMBNESS OF HANDS AND FEET. Numbness in hands and feet NUMBNESS OF HANDS AND FEET.   Pregabalin Other (See Comments)    Drowsiness, dry mouth Drowsiness, dry mouth Makes her tired and thirsty   Augmentin [Amoxicillin-Pot Clavulanate] Diarrhea   Avelox [Moxifloxacin Hcl In Nacl] Other (See Comments)    Tremors    Biaxin [Clarithromycin]     Unsure of reaction   Cedax  [Ceftibuten] Other (See Comments)    tremors   Ciprofloxacin Other (See Comments) and Nausea And Vomiting    Blurred vision Blurred vision Pt can't remember reaction   Clindamycin/Lincomycin     tachycardia   Levofloxacin Other (See Comments)    Feels dehydrated   Lincomycin Other (See Comments)    tachycardia   Montelukast Sodium Other (See Comments)    Numbness and tingling in hand. Numbness and tingling in hand.   Sulfonamide Derivatives Other (See Comments)    Gi upset   Sulfa Antibiotics Nausea Only    Other reaction(s): Other (See Comments) Gi upset  Other reaction(s): Other (See Comments) Gi upset Other reaction(s): Unknown   Past Medical History:  Diagnosis Date   Acid reflux    Allergy    Arthritis    ARTHRITIS IN NECK BY DR. Alroy Dust [facility]   Asthma    Interstitial cystitis    Osteoporosis    PONV (postoperative nausea and vomiting)    Tinnitus    Trigeminal neuralgia    Atypical trigeminal neuralgia    Current Outpatient Medications:    acetaminophen (TYLENOL) 500 MG tablet, Take by mouth., Disp: , Rfl:    albuterol (VENTOLIN HFA) 108 (90 Base) MCG/ACT inhaler, Use 2 puffs every 4 hours as needed for cough, wheeze, tightness in chest, or shortness of breath, Disp: 18 g, Rfl: 1   aspirin-acetaminophen-caffeine (EXCEDRIN MIGRAINE) 250-250-65 MG tablet, Take by mouth., Disp: , Rfl:    b complex vitamins capsule, Take 1 capsule  by mouth daily., Disp: , Rfl:    BREO ELLIPTA 100-25 MCG/INH AEPB, Inhale 1 puff into the lungs daily., Disp: , Rfl:    budesonide-formoterol (SYMBICORT) 80-4.5 MCG/ACT inhaler, Inhale 2 puffs into the lungs in the morning and at bedtime. with spacer and rinse mouth afterwards., Disp: 1 each, Rfl: 5   calcium citrate-vitamin D (CITRACAL+D) 315-200 MG-UNIT tablet, Take by mouth., Disp: , Rfl:    Cholecalciferol 25 MCG (1000 UT) tablet, Take by mouth., Disp: , Rfl:    cycloSPORINE (RESTASIS) 0.05 % ophthalmic emulsion, PLACE ONE DROP INTO  BOTH EYES TWICE A DAY, Disp: , Rfl:    Dermatological Products, Misc. (SUVICORT) EMUL, 1 application as needed, Disp: , Rfl:    dextromethorphan-guaiFENesin (MUCINEX DM) 30-600 MG 12hr tablet, Take 1 tablet by mouth 2 (two) times daily., Disp: , Rfl:    DHEA 25 MG CAPS, Take by mouth., Disp: , Rfl:    doxycycline (VIBRA-TABS) 100 MG tablet, Take 1 tablet (100 mg total) by mouth 2 (two) times daily for 10 days. 1 po bid, Disp: 20 tablet, Rfl: 0   fexofenadine (ALLEGRA) 60 MG tablet, Take by mouth., Disp: , Rfl:    fluconazole (DIFLUCAN) 100 MG tablet, TAKE 1/2 TABLET BY MOUTH ON MONDAY, WEDNESDAY, AND FRIDAY ONLY, Disp: , Rfl:    ipratropium (ATROVENT) 0.06 % nasal spray, Place 2 sprays into both nostrils 3 (three) times daily as needed (nasal drainage)., Disp: 15 mL, Rfl: 5   ipratropium (ATROVENT) 0.06 % nasal spray, 2 sprays., Disp: , Rfl:    lidocaine (XYLOCAINE) 2 % jelly, APPLY TOPICALLY TO AFFECTED AREA EVERY DAY AS NEEDED (use sparingly), Disp: 85 g, Rfl: 0   lidocaine (XYLOCAINE) 2 % jelly, Apply topically daily as needed., Disp: , Rfl:    LORazepam (ATIVAN) 0.5 MG tablet, Take 1 tablet (0.5 mg total) by mouth 2 (two) times daily as needed for anxiety., Disp: 30 tablet, Rfl: 1   LORazepam (ATIVAN) 0.5 MG tablet, Take by mouth., Disp: , Rfl:    magic mouthwash w/lidocaine SOLN, Gargle and spit 56m every 6 hours as needed for sore throat. 659mlidocaine, 6019mystatin, '60mg'$  hydrocortisone tab, qs benadryl total 480m45mDisp: 480 mL, Rfl: 0   magnesium oxide (MAG-OX) 400 MG tablet, Take by mouth., Disp: , Rfl:    mometasone (NASONEX) 50 MCG/ACT nasal spray, PLACE 2 SPRAYS INTO THE NOSE DAILY., Disp: 17 g, Rfl: 5   nystatin (MYCOSTATIN) 100000 UNIT/ML suspension, Take 5 mLs (500,000 Units total) by mouth 4 (four) times daily. Swish and swallow, Disp: 60 mL, Rfl: 0   pantoprazole (PROTONIX) 40 MG tablet, Take 1 tablet (40 mg total) by mouth daily., Disp: 30 tablet, Rfl: 5   phenylephrine  (SUDAFED PE) 10 MG TABS tablet, Take 10 mg by mouth every 4 (four) hours as needed., Disp: , Rfl:    Plecanatide (TRULANCE) 3 MG TABS, 1 tablet, Disp: , Rfl:    Polyethylene Glycol 3350 (MIRALAX PO), Take by mouth as needed., Disp: , Rfl:    Probiotic Product (PROBIOTIC PO), Take by mouth daily., Disp: , Rfl:    Simethicone (GAS-X PO), Take by mouth., Disp: , Rfl:    tizanidine (ZANAFLEX) 2 MG capsule, Take 1-2 capsules (2-4 mg total) by mouth 3 (three) times daily as needed for muscle spasms., Disp: 60 capsule, Rfl: 2   traMADol (ULTRAM) 50 MG tablet, Take 1 tablet (50 mg total) by mouth every 12 (twelve) hours as needed., Disp: 30 tablet, Rfl: 1  triamcinolone cream (KENALOG) 0.1 %, Apply 1 application topically 2 (two) times daily., Disp: 30 g, Rfl: 0   UNABLE TO FIND, Take 1 capsule by mouth daily. Dv3- vitamin D 3 plus immune support, Disp: , Rfl:   Current Facility-Administered Medications:    zoledronic acid (RECLAST) injection 5 mg, 5 mg, Intravenous, Once, Samreet Edenfield M, DO Social History   Socioeconomic History   Marital status: Married    Spouse name: [name]   Number of children: 3   Years of education: 12   Highest education level: Some college, no degree  Occupational History   Occupation: CNA    Comment: Retired  Tobacco Use   Smoking status: Never   Smokeless tobacco: Never  Vaping Use   Vaping Use: Never used  Substance and Sexual Activity   Alcohol use: No    Alcohol/week: 0.0 standard drinks    Comment: 01-19-2016 per pt no   Drug use: No    Comment: 01-19-2016 per pt no    Sexual activity: Not Currently    Birth control/protection: Surgical  Other Topics Concern   Not on file  Social History Narrative   Lives with husband.    Social Determinants of Health   Financial Resource Strain: Not on file  Food Insecurity: Not on file  Transportation Needs: Not on file  Physical Activity: Not on file  Stress: Not on file  Social Connections: Not on file   Intimate Partner Violence: Not on file   Family History  Problem Relation Age of Onset   Hyperlipidemia Mother    Arthritis Mother    Cancer Mother    Lymphoma Mother    Cancer Father    Allergies Father    Allergies Sister    Allergies Sister    Allergic rhinitis Neg Hx    Angioedema Neg Hx    Asthma Neg Hx    Eczema Neg Hx    Immunodeficiency Neg Hx    Urticaria Neg Hx     Objective: Office vital signs reviewed. BP 107/68   Pulse 67   Temp (!) 97.3 F (36.3 C)   Ht 5' 8.5" (1.74 m)   Wt 114 lb (51.7 kg)   SpO2 97%   BMI 17.08 kg/m   Physical Examination:  General: Awake, alert, under nourished, No acute distress HEENT: Normal; sclera white Cardio: regular rate and rhythm, S1S2 heard, no murmurs appreciated Pulm: clear to auscultation bilaterally, no wheezes, rhonchi or rales; normal work of breathing on room air GI: soft, non-tender, non-distended, bowel sounds present x4, no hepatomegaly, no splenomegaly, no masses MSK: Ambulating independently  Assessment/ Plan: 70 y.o. female   Age-related osteoporosis without current pathological fracture - Plan: DG [facility] DEXA  Irritable bowel syndrome with constipation  Malnutrition of moderate degree (HCC) - Plan: VITAMIN D 25 Hydroxy (Vit-D Deficiency, Fractures), Hemoglobin, fingerstick  Generalized anxiety disorder - Plan: Drug Screen 10 W/Conf, Se  Chronic jaw pain - Plan: traMADol (ULTRAM) 50 MG tablet  Controlled substance agreement signed  Primary insomnia - Plan: Doxepin HCl 3 MG TABS  Seems to be tolerating the Reclast without difficulty.  Anticipate she will need 5-year treatment given severity of osteoporosis.  Repeat DEXA scan today.  Reviewed adequate calcium and vitamin D intake.  Given her limited diet this will be needed via supplementation  She will follow-up with GI with regards to IBS.  Has not trialed Trulance for fear of diarrhea  Check vitamin D level, hemoglobin  Controlled substance  contract  and drug screen were obtained as per office policy for ongoing treatment with Ativan and tramadol as needed.  Uses very sparingly and no concerns for misuse. The Narcotic Database has been reviewed.  There were no red flags.    Trial of doxepin for sleep.  Discussed possible side effects. No orders of the defined types were placed in this encounter.  No orders of the defined types were placed in this encounter.    [name], DO [place] [phone]

## 2020-11-11 ENCOUNTER — Other Ambulatory Visit: Payer: Self-pay | Admitting: Family Medicine

## 2020-11-11 DIAGNOSIS — Z1231 Encounter for screening mammogram for malignant neoplasm of breast: Secondary | ICD-10-CM

## 2020-11-18 LAB — DRUG SCREEN 10 W/CONF, SERUM
Amphetamines, IA: NEGATIVE ng/mL
Barbiturates, IA: NEGATIVE ug/mL
Benzodiazepines, IA: NEGATIVE ng/mL
Cocaine & Metabolite, IA: NEGATIVE ng/mL
Methadone, IA: NEGATIVE ng/mL
Opiates, IA: NEGATIVE ng/mL
Oxycodones, IA: NEGATIVE ng/mL
Phencyclidine, IA: NEGATIVE ng/mL
Propoxyphene, IA: NEGATIVE ng/mL
THC(Marijuana) Metabolite, IA: NEGATIVE ng/mL

## 2020-11-18 LAB — VITAMIN D 25 HYDROXY (VIT D DEFICIENCY, FRACTURES): Vit D, 25-Hydroxy: 66.5 ng/mL (ref 30.0–100.0)

## 2020-11-19 DIAGNOSIS — M9901 Segmental and somatic dysfunction of cervical region: Secondary | ICD-10-CM | POA: Diagnosis not present

## 2020-11-19 DIAGNOSIS — M9902 Segmental and somatic dysfunction of thoracic region: Secondary | ICD-10-CM | POA: Diagnosis not present

## 2020-11-19 DIAGNOSIS — M5137 Other intervertebral disc degeneration, lumbosacral region: Secondary | ICD-10-CM | POA: Diagnosis not present

## 2020-11-19 DIAGNOSIS — M9903 Segmental and somatic dysfunction of lumbar region: Secondary | ICD-10-CM | POA: Diagnosis not present

## 2020-11-20 ENCOUNTER — Telehealth: Payer: Medicare HMO

## 2020-11-24 ENCOUNTER — Ambulatory Visit: Payer: Medicare HMO | Admitting: Allergy

## 2020-11-24 DIAGNOSIS — N3000 Acute cystitis without hematuria: Secondary | ICD-10-CM | POA: Diagnosis not present

## 2020-11-26 DIAGNOSIS — M9903 Segmental and somatic dysfunction of lumbar region: Secondary | ICD-10-CM | POA: Diagnosis not present

## 2020-11-26 DIAGNOSIS — M9901 Segmental and somatic dysfunction of cervical region: Secondary | ICD-10-CM | POA: Diagnosis not present

## 2020-11-26 DIAGNOSIS — M5137 Other intervertebral disc degeneration, lumbosacral region: Secondary | ICD-10-CM | POA: Diagnosis not present

## 2020-11-26 DIAGNOSIS — M9902 Segmental and somatic dysfunction of thoracic region: Secondary | ICD-10-CM | POA: Diagnosis not present

## 2020-12-02 ENCOUNTER — Ambulatory Visit (INDEPENDENT_AMBULATORY_CARE_PROVIDER_SITE_OTHER): Payer: Medicare HMO

## 2020-12-02 DIAGNOSIS — Z0000 Encounter for general adult medical examination without abnormal findings: Secondary | ICD-10-CM | POA: Diagnosis not present

## 2020-12-02 NOTE — Progress Notes (Signed)
MEDICARE ANNUAL WELLNESS VISIT  12/02/2020  Telephone Visit Disclaimer This Medicare AWV was conducted by telephone due to national recommendations for restrictions regarding the COVID-19 Pandemic (e.g. social distancing).  I verified, using two identifiers, that I am speaking with [name] or their authorized healthcare agent. I discussed the limitations, risks, security, and privacy concerns of performing an evaluation and management service by telephone and the potential availability of an in-person appointment in the future. The patient expressed understanding and agreed to proceed.  Location of Patient: Home Location of Provider (nurse):  [facility]  Subjective:    Kristy Garza is a 70 y.o. female patient of Kristy Norlander, DO who had a Medicare Annual Wellness Visit today via telephone. Kristy Garza is Retired and lives with her spouse. She has three children and four grandchildren.  She reports that she is socially active and does interact with friends/family regularly. She is physically active and enjoys reading, shopping, going out to dinner and watching television.  Patient Care Team: Kristy Norlander, DO as PCP - General (Family Medicine) Deneise Lever, MD as Consulting Physician (Pulmonary Disease) Minus Breeding, MD as Consulting Physician (Cardiology) Shea Evans, [name], LCSW as Social Worker (Licensed Clinical Social Worker) Ilean China, RN as Case Manager  Advanced Directives 12/02/2020 09/13/2019 09/10/2018 09/07/2017 08/29/2017 07/28/2015 02/24/2015  Does Patient Have a Medical Advance Directive? No No No No No No No  Would patient like information on creating a medical advance directive? No - Patient declined No - Patient declined No - Patient declined Yes (MAU/Ambulatory/Procedural Areas - Information given) - No - patient declined information Yes - Educational materials given  Pre-existing out of facility DNR order (yellow form or pink MOST form) - - - - - - -     Hospital Utilization Over the Past 12 Months: # of hospitalizations or ER visits: 0 # of surgeries: 0  Review of Systems    Patient reports that her overall health is unchanged compared to last year.  History obtained from chart review and the patient  Patient Reported Readings (BP, Pulse, CBG, Weight, etc) none  Pain Assessment Pain : No/denies pain     Current Medications & Allergies (verified) Allergies as of 12/02/2020       Reactions   Cefuroxime Axetil Other (See Comments)   Extreme gas   Gabapentin Other (See Comments)   Constipation Constipation   Montelukast Other (See Comments)   Numbness and tingling in hand. Numbness and tingling in hand. Numbness and tingling in hand. NUMBNESS OF HANDS AND FEET. Numbness in hands and feet NUMBNESS OF HANDS AND FEET.   Pregabalin Other (See Comments)   Drowsiness, dry mouth Drowsiness, dry mouth Makes her tired and thirsty   Augmentin [amoxicillin-pot Clavulanate] Diarrhea   Avelox [moxifloxacin Hcl In Nacl] Other (See Comments)   Tremors   Biaxin [clarithromycin]    Unsure of reaction   Cedax [ceftibuten] Other (See Comments)   tremors   Ciprofloxacin Other (See Comments), Nausea And Vomiting   Blurred vision Blurred vision Pt can't remember reaction   Clindamycin/lincomycin    tachycardia   Levofloxacin Other (See Comments)   Feels dehydrated   Lincomycin Other (See Comments)   tachycardia   Montelukast Sodium Other (See Comments)   Numbness and tingling in hand. Numbness and tingling in hand.   Sulfonamide Derivatives Other (See Comments)   Gi upset   Sulfa Antibiotics Nausea Only   Other reaction(s): Other (See Comments) Gi upset  Other reaction(s): Other (See Comments) Gi upset Other reaction(s): Unknown        Medication List        Accurate as of December 02, 2020  9:47 AM. If you have any questions, ask your nurse or doctor.          acetaminophen 500 MG tablet Commonly known as:  TYLENOL Take by mouth.   albuterol 108 (90 Base) MCG/ACT inhaler Commonly known as: VENTOLIN HFA Use 2 puffs every 4 hours as needed for cough, wheeze, tightness in chest, or shortness of breath   Arnuity Ellipta 100 MCG/ACT Aepb Generic drug: Fluticasone Furoate Inhale into the lungs.   aspirin-acetaminophen-caffeine [id number] MG tablet Commonly known as: EXCEDRIN MIGRAINE Take by mouth.   azelastine 0.1 % nasal spray Commonly known as: ASTELIN Place into both nostrils 2 (two) times daily. Use in each nostril as directed   b complex vitamins capsule Take 1 capsule by mouth daily.   Breo Ellipta 100-25 MCG/INH Aepb Generic drug: fluticasone furoate-vilanterol Inhale 1 puff into the lungs daily.   budesonide-formoterol 80-4.5 MCG/ACT inhaler Commonly known as: Symbicort Inhale 2 puffs into the lungs in the morning and at bedtime. with spacer and rinse mouth afterwards.   calcium citrate-vitamin D 315-200 MG-UNIT tablet Commonly known as: CITRACAL+D Take by mouth.   Cholecalciferol 25 MCG (1000 UT) tablet Take by mouth.   cycloSPORINE 0.05 % ophthalmic emulsion Commonly known as: RESTASIS PLACE ONE DROP INTO BOTH EYES TWICE A DAY   dextromethorphan-guaiFENesin 30-600 MG 12hr tablet Commonly known as: MUCINEX DM Take 1 tablet by mouth 2 (two) times daily.   DHEA 25 MG Caps Take by mouth.   Doxepin HCl 3 MG Tabs Take 1 tablet (3 mg total) by mouth at bedtime as needed (sleep).   fexofenadine 60 MG tablet Commonly known as: ALLEGRA Take by mouth.   fluconazole 100 MG tablet Commonly known as: DIFLUCAN TAKE 1/2 TABLET BY MOUTH ON MONDAY, WEDNESDAY, AND FRIDAY ONLY   GAS-X PO Take by mouth.   ipratropium 0.06 % nasal spray Commonly known as: ATROVENT 2 sprays.   ipratropium 0.06 % nasal spray Commonly known as: ATROVENT Place 2 sprays into both nostrils 3 (three) times daily as needed (nasal drainage).   lidocaine 2 % jelly Commonly known as:  XYLOCAINE APPLY TOPICALLY TO AFFECTED AREA EVERY DAY AS NEEDED (use sparingly)   LORazepam 0.5 MG tablet Commonly known as: ATIVAN Take 1 tablet (0.5 mg total) by mouth 2 (two) times daily as needed for anxiety.   magnesium oxide 400 MG tablet Commonly known as: MAG-OX Take by mouth.   melatonin 5 MG Tabs Take 5 mg by mouth.   MIRALAX PO Take by mouth as needed.   mometasone 50 MCG/ACT nasal spray Commonly known as: NASONEX PLACE 2 SPRAYS INTO THE NOSE DAILY.   nystatin 100000 UNIT/ML suspension Commonly known as: MYCOSTATIN Take 5 mLs (500,000 Units total) by mouth 4 (four) times daily. Swish and swallow   pantoprazole 40 MG tablet Commonly known as: PROTONIX Take 1 tablet (40 mg total) by mouth daily.   PROBIOTIC PO Take by mouth daily.   Suvicort Emul 1 application as needed   tizanidine 2 MG capsule Commonly known as: ZANAFLEX Take 1-2 capsules (2-4 mg total) by mouth 3 (three) times daily as needed for muscle spasms.   traMADol 50 MG tablet Commonly known as: ULTRAM Take 1 tablet (50 mg total) by mouth every 12 (twelve) hours as needed.   triamcinolone cream 0.1 %  Commonly known as: KENALOG Apply 1 application topically 2 (two) times daily.   Trulance 3 MG Tabs Generic drug: Plecanatide 1 tablet   UNABLE TO FIND Take 1 capsule by mouth daily. Dv3- vitamin D 3 plus immune support        History (reviewed): Past Medical History:  Diagnosis Date   Acid reflux    Allergy    Arthritis    ARTHRITIS IN NECK BY DR. Alroy Dust [facility]   Asthma    Interstitial cystitis    Osteoporosis    PONV (postoperative nausea and vomiting)    Tinnitus    Trigeminal neuralgia    Atypical trigeminal neuralgia   Past Surgical History:  Procedure Laterality Date   ABDOMINAL HYSTERECTOMY  2000   CYSTOSCOPY WITH HYDRODISTENSION AND BIOPSY N/A 06/11/2012   Procedure: CYSTOSCOPY/BIOPSY/HYDRODISTENSION with Instillation of Pyridium and Marcaine and Kenalog;  Surgeon:  Ailene Rud, MD;  Location: [facility];  Service: Urology;  Laterality: N/A;  1 hour requested for this case  BLADDER BIOPSY   ETHMOIDECTOMY  2012   SEPTOPLASTY  1980's   vocal cord surgery   1990's   polyp removal   Family History  Problem Relation Age of Onset   Hyperlipidemia Mother    Arthritis Mother    Cancer Mother    Lymphoma Mother    Cancer Father    Allergies Father    Allergies Sister    Allergies Sister    Allergic rhinitis Neg Hx    Angioedema Neg Hx    Asthma Neg Hx    Eczema Neg Hx    Immunodeficiency Neg Hx    Urticaria Neg Hx    Social History   Socioeconomic History   Marital status: Married    Spouse name: [name]   Number of children: 3   Years of education: 12   Highest education level: Some college, no degree  Occupational History   Occupation: CNA    Comment: Retired  Tobacco Use   Smoking status: Never   Smokeless tobacco: Never  Vaping Use   Vaping Use: Never used  Substance and Sexual Activity   Alcohol use: No    Alcohol/week: 0.0 standard drinks    Comment: 01-19-2016 per pt no   Drug use: No    Comment: 01-19-2016 per pt no    Sexual activity: Not Currently    Birth control/protection: Surgical  Other Topics Concern   Not on file  Social History Narrative   Lives with husband.    Social Determinants of Health   Financial Resource Strain: Not on file  Food Insecurity: Not on file  Transportation Needs: Not on file  Physical Activity: Not on file  Stress: Not on file  Social Connections: Not on file    Activities of Daily Living In your present state of health, do you have any difficulty performing the following activities: 12/02/2020  Hearing? N  Vision? N  Difficulty concentrating or making decisions? N  Walking or climbing stairs? N  Dressing or bathing? N  Doing errands, shopping? N  Preparing Food and eating ? N  Using the Toilet? N  In the past six months, have you accidently leaked  urine? N  Do you have problems with loss of bowel control? N  Managing your Medications? N  Managing your Finances? N  Housekeeping or managing your Housekeeping? N  Some recent data might be hidden    Patient Education/ Literacy How often do you need to have someone  help you when you read instructions, pamphlets, or other written materials from your doctor or pharmacy?: 1 - Never What is the last grade level you completed in school?: 12th grade  Exercise Current Exercise Habits: The patient does not participate in regular exercise at present, Exercise limited by: None identified  Diet Patient reports consuming 3 meals a day and 1 snack(s) a day Patient reports that her primary diet is: Regular Patient reports that she does have regular access to food.   Depression Screen PHQ 2/9 Scores 11/10/2020 11/04/2020 08/28/2020 07/22/2020 06/15/2020 12/17/2019 11/18/2019  PHQ - 2 Score '1 2 2 2 '$ 0 0 2  PHQ- 9 Score '4 8 5 5 '$ - 2 10     Fall Risk Fall Risk  12/02/2020 11/10/2020 06/15/2020 09/13/2019 09/10/2018  Falls in the past year? 0 0 0 0 0  Follow up Falls evaluation completed - - - -     Objective:  [name] seemed alert and oriented and she participated appropriately during our telephone visit.  Blood Pressure Weight BMI  BP Readings from Last 3 Encounters:  11/10/20 107/68  11/04/20 128/77  09/29/20 97/73   Wt Readings from Last 3 Encounters:  11/10/20 114 lb (51.7 kg)  11/04/20 112 lb 3.2 oz (50.9 kg)  06/16/20 111 lb 8 oz (50.6 kg)   BMI Readings from Last 1 Encounters:  11/10/20 17.08 kg/m    *Unable to obtain current vital signs, weight, and BMI due to telephone visit type  Hearing/Vision  Kristy Garza did not seem to have difficulty with hearing/understanding during the telephone conversation Reports that she has had a formal eye exam by an eye care professional within the past year Reports that she has not had a formal hearing evaluation within the past year *Unable to fully  assess hearing and vision during telephone visit type  Cognitive Function: 6CIT Screen 12/02/2020 09/13/2019 09/10/2018  What Year? 0 points 0 points 0 points  What month? 0 points 0 points 0 points  What time? 0 points 0 points 0 points  Count back from 20 0 points 0 points 0 points  Months in reverse 0 points 0 points 0 points  Repeat phrase 0 points 0 points 0 points  Total Score 0 0 0   (Normal:0-7, Significant for Dysfunction: >8)  Normal Cognitive Function Screening: Yes   Immunization & Health Maintenance Record Immunization History  Administered Date(s) Administered   Influenza,inj,Quad PF,6+ Mos 12/28/2012, 12/25/2013, 12/26/2014   PFIZER(Purple Top)SARS-COV-2 Vaccination 01/01/2020, 02/05/2020   Pneumococcal Conjugate-13 02/07/2013   Pneumococcal Polysaccharide-23 09/07/2017   Tdap 05/21/2010   Zoster, Live 03/27/2012    Health Maintenance  Topic Date Due   MAMMOGRAM  12/20/2017   COVID-19 Vaccine (3 - Pfizer risk series) 03/04/2020   INFLUENZA VACCINE  10/26/2020   Zoster Vaccines- Shingrix (1 of 2) 02/10/2021 (Originally 11/18/1969)   TETANUS/TDAP  06/15/2021 (Originally 05/21/2020)   DEXA SCAN  11/11/2022   COLONOSCOPY (Pts 45-89yr Insurance coverage will need to be confirmed)  06/28/2025   Hepatitis C Screening  Completed   PNA vac Low Risk Adult  Completed   HPV VACCINES  Aged Out       Assessment  This is a routine wellness examination for MMarland Kitchen  Health Maintenance: Due or Overdue Health Maintenance Due  Topic Date Due   MAMMOGRAM  12/20/2017   COVID-19 Vaccine (3 - Pfizer risk series) 03/04/2020   INFLUENZA VACCINE  10/26/2020    MMarland Kitchendoes not need a  referral for Community Assistance: Care Management:   no Social Work:    no Prescription Assistance:  no Nutrition/Diabetes Education:  no   Plan:  Personalized Goals  Goals Addressed             This Visit's Progress    Patient Stated       12/02/2020 AWV Goal: Fall  Prevention  Over the next year, patient will decrease their risk for falls by: Using assistive devices, such as a cane or walker, as needed Identifying fall risks within their home and correcting them by: Removing throw rugs Adding handrails to stairs or ramps Removing clutter and keeping a clear pathway throughout the home Increasing light, especially at night Adding shower handles/bars Raising toilet seat Identifying potential personal risk factors for falls: Medication side effects Incontinence/urgency Vestibular dysfunction Hearing loss Musculoskeletal disorders Neurological disorders Orthostatic hypotension         Personalized Health Maintenance & Screening Recommendations  Mammogram Influenza vaccine  Lung Cancer Screening Recommended: no (Low Dose CT Chest recommended if Age 38-80 years, 30 pack-year currently smoking OR have quit w/in past 15 years) Hepatitis C Screening recommended: no HIV Screening recommended: no  Advanced Directives: Written information was not prepared per patient's request.  Referrals & Orders No orders of the defined types were placed in this encounter.   Follow-up Plan Follow-up with Kristy Norlander, DO as planned    I have personally reviewed and noted the following in the patient's chart:   Medical and social history Use of alcohol, tobacco or illicit drugs  Current medications and supplements Functional ability and status Nutritional status Physical activity Advanced directives List of other physicians Hospitalizations, surgeries, and ER visits in previous 12 months Vitals Screenings to include cognitive, depression, and falls Referrals and appointments  In addition, I have reviewed and discussed with [name] certain preventive protocols, quality metrics, and best practice recommendations. A written personalized care plan for preventive services as well as general preventive health recommendations is available  and can be mailed to the patient at her request.      Felicity Coyer, LPN    [id number]

## 2020-12-03 DIAGNOSIS — M9903 Segmental and somatic dysfunction of lumbar region: Secondary | ICD-10-CM | POA: Diagnosis not present

## 2020-12-03 DIAGNOSIS — M9902 Segmental and somatic dysfunction of thoracic region: Secondary | ICD-10-CM | POA: Diagnosis not present

## 2020-12-03 DIAGNOSIS — M9901 Segmental and somatic dysfunction of cervical region: Secondary | ICD-10-CM | POA: Diagnosis not present

## 2020-12-03 DIAGNOSIS — M5137 Other intervertebral disc degeneration, lumbosacral region: Secondary | ICD-10-CM | POA: Diagnosis not present

## 2020-12-10 DIAGNOSIS — M5137 Other intervertebral disc degeneration, lumbosacral region: Secondary | ICD-10-CM | POA: Diagnosis not present

## 2020-12-10 DIAGNOSIS — M9901 Segmental and somatic dysfunction of cervical region: Secondary | ICD-10-CM | POA: Diagnosis not present

## 2020-12-10 DIAGNOSIS — M9903 Segmental and somatic dysfunction of lumbar region: Secondary | ICD-10-CM | POA: Diagnosis not present

## 2020-12-10 DIAGNOSIS — M9902 Segmental and somatic dysfunction of thoracic region: Secondary | ICD-10-CM | POA: Diagnosis not present

## 2020-12-11 DIAGNOSIS — R350 Frequency of micturition: Secondary | ICD-10-CM | POA: Diagnosis not present

## 2020-12-11 DIAGNOSIS — N3010 Interstitial cystitis (chronic) without hematuria: Secondary | ICD-10-CM | POA: Diagnosis not present

## 2020-12-14 DIAGNOSIS — M5137 Other intervertebral disc degeneration, lumbosacral region: Secondary | ICD-10-CM | POA: Diagnosis not present

## 2020-12-14 DIAGNOSIS — M9903 Segmental and somatic dysfunction of lumbar region: Secondary | ICD-10-CM | POA: Diagnosis not present

## 2020-12-14 DIAGNOSIS — M9901 Segmental and somatic dysfunction of cervical region: Secondary | ICD-10-CM | POA: Diagnosis not present

## 2020-12-14 DIAGNOSIS — M9902 Segmental and somatic dysfunction of thoracic region: Secondary | ICD-10-CM | POA: Diagnosis not present

## 2020-12-16 ENCOUNTER — Encounter: Payer: Medicare HMO | Admitting: Family Medicine

## 2020-12-17 ENCOUNTER — Ambulatory Visit: Payer: Medicare HMO | Admitting: Family Medicine

## 2020-12-17 ENCOUNTER — Encounter: Payer: Self-pay | Admitting: Family Medicine

## 2020-12-17 ENCOUNTER — Other Ambulatory Visit: Payer: Self-pay

## 2020-12-17 VITALS — BP 124/80 | HR 71 | Temp 98.6°F | Resp 17 | Ht 68.5 in | Wt 113.4 lb

## 2020-12-17 DIAGNOSIS — K219 Gastro-esophageal reflux disease without esophagitis: Secondary | ICD-10-CM

## 2020-12-17 DIAGNOSIS — J310 Chronic rhinitis: Secondary | ICD-10-CM

## 2020-12-17 DIAGNOSIS — R059 Cough, unspecified: Secondary | ICD-10-CM

## 2020-12-17 DIAGNOSIS — J4540 Moderate persistent asthma, uncomplicated: Secondary | ICD-10-CM

## 2020-12-17 MED ORDER — AZELASTINE HCL 0.1 % NA SOLN: 2.0000 | Freq: Two times a day (BID) | NASAL | 3 refills | Status: DC

## 2020-12-17 MED ORDER — MOMETASONE FUROATE 50 MCG/ACT NA SUSP: NASAL | 5 refills | Status: DC

## 2020-12-17 MED ORDER — PANTOPRAZOLE SODIUM 40 MG PO TBEC: 40.0000 mg | DELAYED_RELEASE_TABLET | Freq: Every day | ORAL | 5 refills | Status: DC

## 2020-12-17 NOTE — Progress Notes (Addendum)
[address] [place] [address] Dept: [phone]  FOLLOW UP NOTE  Patient ID: Kristy Garza, female    DOB: [birth year]  Age: 70 y.o. MRN: [id number] Date of Office Visit: 12/17/2020  Assessment  Chief Complaint: Follow-up (Pt is reporting due to dry cough, headache that's started over a week now. Pt states she haven't taken a COVID test since cough started. Pt states asthma symptoms are well managed. But currently have some tightness in chest, pt states if she eat anything spicy her tongue tingles.), Cough, Nasal Congestion, and Headache  HPI Kristy Garza is an 70 year old female who presents the clinic for follow-up visit.  She was last seen in this clinic on 08/20/2020 by Dr. Maudie Mercury for evaluation of asthma, chronic rhinitis, and reflux.  At today's visit, she reports that she continues to experience chronic cough that has worsened over the last 2 weeks. She reports her asthma as moderately well controlled with no shortness of breath or wheeze with rest or activity, however, she continues to experience a "dry, hacking cough" as well as intermittent chest tightness over the last week resolving with inhaler use. She has used Arnuity 100 two times this week as well as albuterol 2 times this week with moderate relief of cough.  Chronic rhinitis is reported as poorly controlled with symptoms including nasal congestion, occasional sneeze, and copious postnasal drainage with frequent throat clearing.  She denies rhinorrhea or discolored nasal drainage.  She continues Allegra 180 mg once a day, saline nasal rinses, azelastine, and Mucinex daily.  She does report headache in the frontal area that occurs in the morning for the last several days and resolves when she becomes active.  She denies reflux and is not currently taking pantoprazole.  She has gone to an Special educational needs teacher and tried a natural digestive enzyme to help control reflux with no relief of cough.  She does have a history of  several sinus surgeries including ethmoidectomy and septoplasty as well as vocal cord surgery.  Her current medications are listed in the chart.  Drug Allergies:  Allergies  Allergen Reactions   Cefuroxime Axetil Other (See Comments)    Extreme gas   Gabapentin Other (See Comments)    Constipation Constipation   Montelukast Other (See Comments)    Numbness and tingling in hand. Numbness and tingling in hand. Numbness and tingling in hand. NUMBNESS OF HANDS AND FEET. Numbness in hands and feet NUMBNESS OF HANDS AND FEET.   Pregabalin Other (See Comments)    Drowsiness, dry mouth Drowsiness, dry mouth Makes her tired and thirsty   Augmentin [Amoxicillin-Pot Clavulanate] Diarrhea   Avelox [Moxifloxacin Hcl In Nacl] Other (See Comments)    Tremors    Biaxin [Clarithromycin]     Unsure of reaction   Cedax [Ceftibuten] Other (See Comments)    tremors   Ciprofloxacin Other (See Comments) and Nausea And Vomiting    Blurred vision Blurred vision Pt can't remember reaction   Clindamycin/Lincomycin     tachycardia   Levofloxacin Other (See Comments)    Feels dehydrated   Lincomycin Other (See Comments)    tachycardia   Montelukast Sodium Other (See Comments)    Numbness and tingling in hand. Numbness and tingling in hand.   Sulfonamide Derivatives Other (See Comments)    Gi upset   Sulfa Antibiotics Nausea Only    Other reaction(s): Other (See Comments) Gi upset  Other reaction(s): Other (See Comments) Gi upset Other reaction(s): Unknown  Physical Exam: BP 124/80   Pulse 71   Temp 98.6 F (37 C) (Temporal)   Resp 17   Ht 5' 8.5" (1.74 m)   Wt 113 lb 6.4 oz (51.4 kg)   SpO2 98%   BMI 16.99 kg/m    Physical Exam Vitals reviewed.  Constitutional:      Appearance: She is well-developed.  HENT:     Head: Normocephalic and atraumatic.     Right Ear: Tympanic membrane normal.     Left Ear: Tympanic membrane normal.     Nose:     Comments: Bilateral nares  slightly erythematous with clear nasal drainage noted.  Pharynx normal.  Ears normal.  Eyes normal.    Mouth/Throat:     Pharynx: Oropharynx is clear.  Eyes:     Conjunctiva/sclera: Conjunctivae normal.  Cardiovascular:     Rate and Rhythm: Normal rate and regular rhythm.     Heart sounds: Normal heart sounds. No murmur heard. Pulmonary:     Effort: Pulmonary effort is normal.     Breath sounds: Normal breath sounds.     Comments: Lungs clear to auscultation Musculoskeletal:        General: Normal range of motion.     Cervical back: Normal range of motion and neck supple.  Skin:    General: Skin is warm and dry.  Neurological:     Mental Status: She is alert and oriented to person, place, and time.  Psychiatric:        Mood and Affect: Mood normal.        Behavior: Behavior normal.        Thought Content: Thought content normal.        Judgment: Judgment normal.    Diagnostics: FVC 3.34, FEV1 2.33.  Predicted FVC 3.32, predicted FEV1 2.99.  Spirometry indicates normal ventilatory function.  Assessment and Plan: 1. Moderate persistent asthma without complication   2. Cough   3. Chronic rhinitis   4. Gastroesophageal reflux disease, unspecified whether esophagitis present     Meds ordered this encounter  Medications   pantoprazole (PROTONIX) 40 MG tablet    Sig: Take 1 tablet (40 mg total) by mouth daily.    Dispense:  30 tablet    Refill:  5   DISCONTD: mometasone (NASONEX) 50 MCG/ACT nasal spray    Sig: PLACE 2 SPRAYS INTO THE NOSE DAILY.    Dispense:  17 g    Refill:  5   azelastine (ASTELIN) 0.1 % nasal spray    Sig: Place 2 sprays into both nostrils 2 (two) times daily. Use in each nostril as directed    Dispense:  30 mL    Refill:  3   mometasone (NASONEX) 50 MCG/ACT nasal spray    Sig: PLACE 2 SPRAYS INTO THE NOSE DAILY.    Dispense:  17 g    Refill:  5     Patient Instructions  Asthma Increase Arnuity 100 to 1 puff once a day with a spacer to prevent  cough and wheeze Continue albuterol 2 puffs once every 4 hours as needed for cough or wheeze You may use albuterol 2 puffs 5 to 15 minutes before activity to decrease cough or wheeze  Chronic rhinitis Begin Nasonex 2 sprays in each nostril once a day as needed for stuffy nose. In the right nostril, point the applicator out toward the right ear. In the left nostril, point the applicator out toward the left ear Continue saline nasal rinses as needed  for nasal symptoms. Use this before any medicated nasal sprays for best result Continue azelastine 2 sprays in each nostril twice a day only as needed for a runny nose or drainage For thick postnasal drainage, begin Mucinex 600 to 1200 mg twice a day and increase fluid intake to thin out mucus Try to stop Allegra at this time. If you can not do without Allegra, then take once a day only as needed for a runny nose or itch  Reflux Continue dietary and lifestyle modifications as listed below Restart pantoprazole 40 mg once a day to control reflux. This may help decrease your cough  Chronic cough Continue the treatment plans as listed above Recommend referral to ENT specialist for evaluation and treatment  Call the clinic if this treatment plan is not working well for you  Follow up in 1 month or sooner if needed.  Return in about 4 weeks (around 01/14/2021), or if symptoms worsen or fail to improve.    Thank you for the opportunity to care for this patient.  Please do not hesitate to contact me with questions.  Gareth Morgan, FNP [facility]

## 2020-12-17 NOTE — Patient Instructions (Addendum)
Asthma Increase Arnuity 100 to 1 puff once a day with a spacer to prevent cough and wheeze Continue albuterol 2 puffs once every 4 hours as needed for cough or wheeze You may use albuterol 2 puffs 5 to 15 minutes before activity to decrease cough or wheeze  Chronic rhinitis Begin Nasonex 2 sprays in each nostril once a day as needed for stuffy nose. In the right nostril, point the applicator out toward the right ear. In the left nostril, point the applicator out toward the left ear Continue saline nasal rinses as needed for nasal symptoms. Use this before any medicated nasal sprays for best result Continue azelastine 2 sprays in each nostril twice a day only as needed for a runny nose or drainage For thick postnasal drainage, begin Mucinex 600 to 1200 mg twice a day and increase fluid intake to thin out mucus Try to stop Allegra at this time. If you can not do without Allegra, then take once a day only as needed for a runny nose or itch  Reflux Continue dietary and lifestyle modifications as listed below Restart pantoprazole 40 mg once a day to control reflux. This may help decrease your cough  Chronic cough Continue the treatment plans as listed above Recommend referral to ENT specialist for evaluation and treatment  Call the clinic if this treatment plan is not working well for you  Follow up in 1 month or sooner if needed.

## 2020-12-18 DIAGNOSIS — N3010 Interstitial cystitis (chronic) without hematuria: Secondary | ICD-10-CM | POA: Diagnosis not present

## 2020-12-29 ENCOUNTER — Other Ambulatory Visit: Payer: Self-pay | Admitting: Allergy & Immunology

## 2020-12-30 ENCOUNTER — Telehealth: Payer: Medicare HMO

## 2020-12-30 ENCOUNTER — Ambulatory Visit (INDEPENDENT_AMBULATORY_CARE_PROVIDER_SITE_OTHER): Payer: Medicare HMO | Admitting: Family Medicine

## 2020-12-30 DIAGNOSIS — N3000 Acute cystitis without hematuria: Secondary | ICD-10-CM | POA: Diagnosis not present

## 2020-12-30 DIAGNOSIS — Z91199 Patient's noncompliance with other medical treatment and regimen due to unspecified reason: Secondary | ICD-10-CM

## 2020-12-30 NOTE — Progress Notes (Signed)
Attempted to reach at number provided and "number has not been established" x2 Attempted to reach at alternate number and no answer no VM

## 2020-12-31 DIAGNOSIS — R059 Cough, unspecified: Secondary | ICD-10-CM | POA: Diagnosis not present

## 2020-12-31 DIAGNOSIS — J029 Acute pharyngitis, unspecified: Secondary | ICD-10-CM | POA: Diagnosis not present

## 2020-12-31 DIAGNOSIS — R509 Fever, unspecified: Secondary | ICD-10-CM | POA: Diagnosis not present

## 2020-12-31 DIAGNOSIS — J329 Chronic sinusitis, unspecified: Secondary | ICD-10-CM | POA: Diagnosis not present

## 2021-01-01 ENCOUNTER — Other Ambulatory Visit: Payer: Self-pay | Admitting: Family

## 2021-01-02 NOTE — Telephone Encounter (Signed)
Ok to refill albuterol.

## 2021-01-04 ENCOUNTER — Telehealth: Payer: Self-pay | Admitting: Family Medicine

## 2021-01-04 NOTE — Telephone Encounter (Signed)
Pt called stating she went to Urgent Care last Thursday and was given azithromycin for a sinus infection. Patient is on her last tablet and is not feeling any better, patient also stated she was given an injection of kenalog of 40 mg. Patient is unsure of what else to do other than take mucinex and continue with her mediations. Patient still has congestion and believes it should have cleared up by now with the medication. Patient would like advise on what else to do. I did offer patient an office visit but she preferred to send a message to the nurses first.   Best contact number: [phone]

## 2021-01-04 NOTE — Telephone Encounter (Signed)
Still has a lot of congestion in head and throat, not coughing up anything, she has been tested for Covid on Thursday 10/06/2 but no results yet.  Patient scheduled an appointment with Dr. Maudie Mercury in the [facility] office to be seen.  Kristy Garza [phone]

## 2021-01-04 NOTE — Telephone Encounter (Signed)
Can you please call this patient and get some of her symptoms. She will need to make an in person or telephone call for an appointment as we did not diagnose her with acute sinusitis and she has a treatment failure with azithromycin and a steroid. Thank you

## 2021-01-04 NOTE — Telephone Encounter (Signed)
Thank you :)

## 2021-01-05 ENCOUNTER — Other Ambulatory Visit: Payer: Self-pay

## 2021-01-05 ENCOUNTER — Ambulatory Visit: Payer: Medicare HMO | Admitting: Allergy

## 2021-01-05 ENCOUNTER — Encounter: Payer: Self-pay | Admitting: Allergy

## 2021-01-05 VITALS — BP 112/68 | HR 75 | Temp 98.4°F | Resp 17 | Ht 68.5 in | Wt 112.8 lb

## 2021-01-05 DIAGNOSIS — J069 Acute upper respiratory infection, unspecified: Secondary | ICD-10-CM | POA: Insufficient documentation

## 2021-01-05 DIAGNOSIS — J4540 Moderate persistent asthma, uncomplicated: Secondary | ICD-10-CM | POA: Diagnosis not present

## 2021-01-05 DIAGNOSIS — K219 Gastro-esophageal reflux disease without esophagitis: Secondary | ICD-10-CM

## 2021-01-05 DIAGNOSIS — J310 Chronic rhinitis: Secondary | ICD-10-CM | POA: Diagnosis not present

## 2021-01-05 NOTE — Patient Instructions (Addendum)
Upper respiratory infection: May use Mucinex twice a day with plenty of water.   Non-allergic rhinitis May use Nasonex nasal spray 1 spray per nostril twice a day as needed for nasal congestion.  May use ipratropium nasal 0.06% spray 1-2 sprays per nostril three times a day as needed for runny nose/drainage. Nasal saline spray (i.e., Simply Saline) or nasal saline lavage (i.e., NeilMed) is recommended as needed and prior to medicated nasal sprays. Use over the counter antihistamines such as Zyrtec (cetirizine), Claritin (loratadine), Allegra (fexofenadine), or Xyzal (levocetirizine) daily as needed. May switch antihistamines every few months. May take Mucinex DM with plenty of water twice a day to loosen up the mucous. Refer to ENT - for nonallergic rhinitis (Dr. Wilburn Cornelia).   Asthma Daily controller medication(s): continue Arnuity 130mcg 1 puff once a day and rinse mouth after each use.  May use albuterol rescue inhaler 2 puffs every 4 to 6 hours as needed for shortness of breath, chest tightness, coughing, and wheezing. May use albuterol rescue inhaler 2 puffs 5 to 15 minutes prior to strenuous physical activities. Monitor frequency of use.  Asthma control goals:  Full participation in all desired activities (may need albuterol before activity) Albuterol use two times or less a week on average (not counting use with activity) Cough interfering with sleep two times or less a month Oral steroids no more than once a year No hospitalizations  Reflux Continue dietary and lifestyle modifications as listed below Continue pantoprazole 40mg  daily. Nothing to eat or drink 30 minutes afterwards.  Follow up in 2 months with Dr. Neldon Mc as scheduled or sooner if needed.  Drink plenty of fluids. Water, juice, clear broth or warm lemon water are good choices. Avoid caffeine and alcohol, which can dehydrate you. Eat chicken soup. Chicken soup and other warm fluids can be soothing and loosen  congestion. Rest. Adjust your room's temperature and humidity. Keep your room warm but not overheated. If the air is dry, a cool-mist humidifier or vaporizer can moisten the air and help ease congestion and coughing. Keep the humidifier clean to prevent the growth of bacteria and molds. Soothe your throat. Perform a saltwater gargle. Dissolve one-quarter to a half teaspoon of salt in a 4- to 8-ounce glass of warm water. This can relieve a sore or scratchy throat temporarily. Use saline nasal drops. To help relieve nasal congestion, try saline nasal drops. You can buy these drops over the counter, and they can help relieve symptoms ? even in children. Take over-the-counter cold and cough medications. For adults and children older than 5, over-the-counter decongestants, antihistamines and pain relievers might offer some symptom relief. However, they won't prevent a cold or shorten its duration.

## 2021-01-05 NOTE — Assessment & Plan Note (Signed)
Past history - 2022 bloodwork negative to environmental allergy panel.  Interim history - still has PND, did not see ENT as recommended at prior visits.  May use Nasonex nasal spray 1 spray per nostril twice a day as needed for nasal congestion.   May use ipratropium nasal 0.06% spray 1-2 sprays per nostril three times a day as needed for runny nose/drainage.  Nasal saline spray (i.e., Simply Saline) or nasal saline lavage (i.e., NeilMed) is recommended as needed and prior to medicated nasal sprays. . Use over the counter antihistamines such as Zyrtec (cetirizine), Claritin (loratadine), Allegra (fexofenadine), or Xyzal (levocetirizine) daily as needed. May switch antihistamines every few months.  Refer to ENT - for nonallergic rhinitis (Dr. Wilburn Cornelia).

## 2021-01-05 NOTE — Assessment & Plan Note (Signed)
Went to UC and treated with zpak and steroid injection. Today feeling better. . May use Mucinex twice a day - take with plenty of water.  [facility] handout on symptomatic treatment for URIs. . No indication for any additional antibiotics or prednisone at this time.

## 2021-01-05 NOTE — Assessment & Plan Note (Signed)
Stable.   Continue dietary and lifestyle modifications as listed below  Continue pantoprazole 40mg  daily. Nothing to eat or drink 30 minutes afterwards.

## 2021-01-05 NOTE — Progress Notes (Signed)
Follow Up Note  RE: Kristy Garza MRN: [id number] DOB: 11-22-1950 Date of Office Visit: 01/05/2021  Referring provider: [name], DO Primary care provider: [name], DO  Chief Complaint: Follow-up (Pt states she noticed some improvement since LOV, but she still have itchy throat. She went to Urgent care and was tested for flu and covid all negative. She was given Z-pak for sinus infection.), Asthma, and Allergic Rhinitis   History of Present Illness: I had the pleasure of seeing Kristy Garza for a follow up visit at the [facility] on 01/05/2021. She is a 70 y.o. female, who is being followed for asthma, rhinitis, reflux and cough. Her previous allergy office visit was on 12/17/2020 with Gareth Morgan, [place]. Today is a new complaint visit of not feeling well .  Asthma/rhinitis Currently using Arnuity 131mcg 1 puff once a day at night with some benefit.  Using albuterol a few times per day during URI with some benefit.   Patient finished zpak and steroid injection 40mg  at urgent care due to coughing and head congestion and slowly symptoms have been improving.   She is still having some scratchy throat. Currently taking ipratropium 2 sprays per nostril once a day and Nasonex 2 sprays BID. No nosebleeds. Still taking allegra 180mg  daily.  Taking Mucinex DM as needed.   Assessment and Plan: Kristy Garza is a 69 y.o. female with: Upper respiratory tract infection Went to UC and treated with zpak and steroid injection. Today feeling better. May use Mucinex twice a day - take with plenty of water.  Gave handout on symptomatic treatment for URIs. No indication for any additional antibiotics or prednisone at this time.   Moderate persistent asthma without complication Past history - Breo caused thrush. 2022 spirometry showed: normal pattern with 7% improvement in FEV1 post bronchodilator treatment. Clinically feeling much better.  Interim history -  coughing better with Arnuity.   Daily controller medication(s): continue Arnuity 152mcg 1 puff once a day and rinse mouth after each use.  May use albuterol rescue inhaler 2 puffs every 4 to 6 hours as needed for shortness of breath, chest tightness, coughing, and wheezing. May use albuterol rescue inhaler 2 puffs 5 to 15 minutes prior to strenuous physical activities. Monitor frequency of use.  Get spirometry at next visit.  Nonallergic rhinitis Past history - 2022 bloodwork negative to environmental allergy panel.  Interim history - still has PND, did not see ENT as recommended at prior visits. May use Nasonex nasal spray 1 spray per nostril twice a day as needed for nasal congestion.  May use ipratropium nasal 0.06% spray 1-2 sprays per nostril three times a day as needed for runny nose/drainage. Nasal saline spray (i.e., Simply Saline) or nasal saline lavage (i.e., NeilMed) is recommended as needed and prior to medicated nasal sprays. Use over the counter antihistamines such as Zyrtec (cetirizine), Claritin (loratadine), Allegra (fexofenadine), or Xyzal (levocetirizine) daily as needed. May switch antihistamines every few months. Refer to ENT - for nonallergic rhinitis (Dr. Wilburn Cornelia).   Gastroesophageal reflux disease Stable.  Continue dietary and lifestyle modifications as listed below Continue pantoprazole 40mg  daily. Nothing to eat or drink 30 minutes afterwards.  Return in about 2 months (around 03/07/2021).  No orders of the defined types were placed in this encounter.  Lab Orders  No laboratory test(s) ordered today    Diagnostics: None.  Medication List:  Current Outpatient Medications  Medication Sig Dispense Refill   acetaminophen (TYLENOL)  500 MG tablet Take by mouth.     albuterol (VENTOLIN HFA) 108 (90 Base) MCG/ACT inhaler USE 2 PUFFS EVERY 4 HOURS AS NEEDED FOR COUGH, WHEEZE, TIGHTNESS IN CHEST, OR SHORTNESS OF BREATH 18 each 1   aspirin-acetaminophen-caffeine  (EXCEDRIN MIGRAINE) 250-250-65 MG tablet Take by mouth.     b complex vitamins capsule Take 1 capsule by mouth daily.     calcium citrate-vitamin D (CITRACAL+D) 315-200 MG-UNIT tablet Take by mouth.     Cholecalciferol 25 MCG (1000 UT) tablet Take by mouth.     cycloSPORINE (RESTASIS) 0.05 % ophthalmic emulsion PLACE ONE DROP INTO BOTH EYES TWICE A DAY     Dermatological Products, Misc. (SUVICORT) EMUL 1 application as needed     dextromethorphan-guaiFENesin (MUCINEX DM) 30-600 MG 12hr tablet Take 1 tablet by mouth 2 (two) times daily.     DHEA 25 MG CAPS Take by mouth.     fexofenadine (ALLEGRA) 60 MG tablet Take by mouth.     Fluticasone Furoate (ARNUITY ELLIPTA) 200 MCG/ACT AEPB INHALE 1 PUFF BY MOUTH EVERY DAY 30 each 5   lidocaine (XYLOCAINE) 2 % jelly APPLY TOPICALLY TO AFFECTED AREA EVERY DAY AS NEEDED (use sparingly) 85 g 0   LORazepam (ATIVAN) 0.5 MG tablet Take 1 tablet (0.5 mg total) by mouth 2 (two) times daily as needed for anxiety. 30 tablet 1   magnesium oxide (MAG-OX) 400 MG tablet Take by mouth.     melatonin 5 MG TABS Take 5 mg by mouth.     mometasone (NASONEX) 50 MCG/ACT nasal spray PLACE 2 SPRAYS INTO THE NOSE DAILY. 17 g 5   pantoprazole (PROTONIX) 40 MG tablet Take 1 tablet (40 mg total) by mouth daily. 30 tablet 5   Polyethylene Glycol 3350 (MIRALAX PO) Take by mouth as needed.     Probiotic Product (PROBIOTIC PO) Take by mouth daily.     Simethicone (GAS-X PO) Take by mouth.     tizanidine (ZANAFLEX) 2 MG capsule Take 1-2 capsules (2-4 mg total) by mouth 3 (three) times daily as needed for muscle spasms. 60 capsule 2   traMADol (ULTRAM) 50 MG tablet Take 1 tablet (50 mg total) by mouth every 12 (twelve) hours as needed. 30 tablet 1   triamcinolone cream (KENALOG) 0.1 % Apply 1 application topically 2 (two) times daily. 30 g 0   Doxepin HCl 3 MG TABS Take 1 tablet (3 mg total) by mouth at bedtime as needed (sleep). (Patient not taking: No sig reported) 30 tablet 5    fluconazole (DIFLUCAN) 100 MG tablet TAKE 1/2 TABLET BY MOUTH ON MONDAY, WEDNESDAY, AND FRIDAY ONLY (Patient not taking: No sig reported)     ipratropium (ATROVENT) 0.06 % nasal spray Place 2 sprays into both nostrils 3 (three) times daily as needed (nasal drainage). (Patient not taking: No sig reported) 15 mL 5   nystatin (MYCOSTATIN) 100000 UNIT/ML suspension Take 5 mLs (500,000 Units total) by mouth 4 (four) times daily. Swish and swallow (Patient not taking: No sig reported) 60 mL 0   Plecanatide (TRULANCE) 3 MG TABS 1 tablet (Patient not taking: No sig reported)     UNABLE TO FIND Take 1 capsule by mouth daily. Dv3- vitamin D 3 plus immune support (Patient not taking: No sig reported)     Current Facility-Administered Medications  Medication Dose Route Frequency Provider Last Rate Last Admin   zoledronic acid (RECLAST) injection 5 mg  5 mg Intravenous Once Ronnie Doss M, DO  Allergies: Allergies  Allergen Reactions   Cefuroxime Axetil Other (See Comments)    Extreme gas   Gabapentin Other (See Comments)    Constipation Constipation   Montelukast Other (See Comments)    Numbness and tingling in hand. Numbness and tingling in hand. Numbness and tingling in hand. NUMBNESS OF HANDS AND FEET. Numbness in hands and feet NUMBNESS OF HANDS AND FEET.   Pregabalin Other (See Comments)    Drowsiness, dry mouth Drowsiness, dry mouth Makes her tired and thirsty   Augmentin [Amoxicillin-Pot Clavulanate] Diarrhea   Avelox [Moxifloxacin Hcl In Nacl] Other (See Comments)    Tremors    Biaxin [Clarithromycin]     Unsure of reaction   Cedax [Ceftibuten] Other (See Comments)    tremors   Ciprofloxacin Other (See Comments) and Nausea And Vomiting    Blurred vision Blurred vision Pt can't remember reaction   Clindamycin/Lincomycin     tachycardia   Levofloxacin Other (See Comments)    Feels dehydrated   Lincomycin Other (See Comments)    tachycardia   Montelukast Sodium Other  (See Comments)    Numbness and tingling in hand. Numbness and tingling in hand.   Sulfonamide Derivatives Other (See Comments)    Gi upset   Sulfa Antibiotics Nausea Only    Other reaction(s): Other (See Comments) Gi upset  Other reaction(s): Other (See Comments) Gi upset Other reaction(s): Unknown   I reviewed her past medical history, social history, family history, and environmental history and no significant changes have been reported from her previous visit.  Review of Systems  Constitutional:  Negative for appetite change, chills, fever and unexpected weight change.  HENT:  Positive for postnasal drip and rhinorrhea.   Eyes:  Negative for itching.  Respiratory:  Positive for cough. Negative for chest tightness, shortness of breath and wheezing.   Gastrointestinal:  Negative for abdominal pain.  Skin:  Negative for rash.  Allergic/Immunologic: Negative for environmental allergies.  Neurological:  Positive for headaches.   Objective: BP 112/68   Pulse 75   Temp 98.4 F (36.9 C) (Temporal)   Resp 17   Ht 5' 8.5" (1.74 m)   Wt 112 lb 12.8 oz (51.2 kg)   SpO2 98%   BMI 16.90 kg/m  Body mass index is 16.9 kg/m. Physical Exam Vitals and nursing note reviewed.  Constitutional:      Appearance: Normal appearance. She is well-developed.  HENT:     Head: Normocephalic and atraumatic.     Right Ear: External ear normal.     Left Ear: External ear normal.     Nose: Nose normal.     Mouth/Throat:     Mouth: Mucous membranes are moist.     Pharynx: Oropharynx is clear.  Eyes:     Conjunctiva/sclera: Conjunctivae normal.  Cardiovascular:     Rate and Rhythm: Normal rate and regular rhythm.     Heart sounds: Normal heart sounds. No murmur heard. Pulmonary:     Effort: Pulmonary effort is normal.     Breath sounds: Normal breath sounds. No wheezing, rhonchi or rales.  Musculoskeletal:     Cervical back: Neck supple.  Skin:    General: Skin is warm.     Findings: No  rash.  Neurological:     Mental Status: She is alert and oriented to person, place, and time.  Psychiatric:        Behavior: Behavior normal.   Previous notes and tests were reviewed. The plan was reviewed with the  patient/family, and all questions/concerned were addressed.  It was my pleasure to see [place] today and participate in her care. Please feel free to contact me with any questions or concerns.  Sincerely,  Rexene Alberts, DO Allergy & Immunology  [facility] office: [place] office: [phone]

## 2021-01-05 NOTE — Assessment & Plan Note (Signed)
Past history - Breo caused thrush. 2022 spirometry showed: normal pattern with 7% improvement in FEV1 post bronchodilator treatment. Clinically feeling much better.  Interim history - coughing better with Arnuity.   . Daily controller medication(s): continue Arnuity 127mcg 1 puff once a day and rinse mouth after each use.  . May use albuterol rescue inhaler 2 puffs every 4 to 6 hours as needed for shortness of breath, chest tightness, coughing, and wheezing. May use albuterol rescue inhaler 2 puffs 5 to 15 minutes prior to strenuous physical activities. Monitor frequency of use.   Get spirometry at next visit.

## 2021-01-06 ENCOUNTER — Ambulatory Visit (INDEPENDENT_AMBULATORY_CARE_PROVIDER_SITE_OTHER): Payer: Medicare HMO | Admitting: *Deleted

## 2021-01-06 DIAGNOSIS — F411 Generalized anxiety disorder: Secondary | ICD-10-CM

## 2021-01-06 DIAGNOSIS — J45909 Unspecified asthma, uncomplicated: Secondary | ICD-10-CM

## 2021-01-06 DIAGNOSIS — M810 Age-related osteoporosis without current pathological fracture: Secondary | ICD-10-CM

## 2021-01-06 NOTE — Chronic Care Management (AMB) (Signed)
Chronic Care Management   CCM RN Visit Note  01/06/2021 Name: Kristy Garza MRN: [id number] DOB: April 04, 1950  Subjective: Kristy Garza is a 70 y.o. year old female who is a primary care patient of [name], DO. The care management team was consulted for assistance with disease management and care coordination needs.    Engaged with patient by telephone for follow up visit in response to provider referral for case management and/or care coordination services.   Consent to Services:  The patient was given information about Chronic Care Management services, agreed to services, and gave verbal consent prior to initiation of services.  Please see initial visit note for detailed documentation.   Patient agreed to services and verbal consent obtained.   Assessment: Review of patient past medical history, allergies, medications, health status, including review of consultants reports, laboratory and other test data, was performed as part of comprehensive evaluation and provision of chronic care management services.   SDOH (Social Determinants of Health) assessments and interventions performed:    CCM Care Plan  Allergies  Allergen Reactions   Cefuroxime Axetil Other (See Comments)    Extreme gas   Gabapentin Other (See Comments)    Constipation Constipation   Montelukast Other (See Comments)    Numbness and tingling in hand. Numbness and tingling in hand. Numbness and tingling in hand. NUMBNESS OF HANDS AND FEET. Numbness in hands and feet NUMBNESS OF HANDS AND FEET.   Pregabalin Other (See Comments)    Drowsiness, dry mouth Drowsiness, dry mouth Makes her tired and thirsty   Augmentin [Amoxicillin-Pot Clavulanate] Diarrhea   Avelox [Moxifloxacin Hcl In Nacl] Other (See Comments)    Tremors    Biaxin [Clarithromycin]     Unsure of reaction   Cedax [Ceftibuten] Other (See Comments)    tremors   Ciprofloxacin Other (See Comments) and Nausea And Vomiting     Blurred vision Blurred vision Pt can't remember reaction   Clindamycin/Lincomycin     tachycardia   Levofloxacin Other (See Comments)    Feels dehydrated   Lincomycin Other (See Comments)    tachycardia   Montelukast Sodium Other (See Comments)    Numbness and tingling in hand. Numbness and tingling in hand.   Sulfonamide Derivatives Other (See Comments)    Gi upset   Sulfa Antibiotics Nausea Only    Other reaction(s): Other (See Comments) Gi upset  Other reaction(s): Other (See Comments) Gi upset Other reaction(s): Unknown    Outpatient Encounter Medications as of 01/06/2021  Medication Sig Note   acetaminophen (TYLENOL) 500 MG tablet Take by mouth. 01/07/2016: Received from: [facility] Received Sig: Take by mouth.   albuterol (VENTOLIN HFA) 108 (90 Base) MCG/ACT inhaler USE 2 PUFFS EVERY 4 HOURS AS NEEDED FOR COUGH, WHEEZE, TIGHTNESS IN CHEST, OR SHORTNESS OF BREATH    aspirin-acetaminophen-caffeine (EXCEDRIN MIGRAINE) 250-250-65 MG tablet Take by mouth.    b complex vitamins capsule Take 1 capsule by mouth daily.    calcium citrate-vitamin D (CITRACAL+D) 315-200 MG-UNIT tablet Take by mouth.    Cholecalciferol 25 MCG (1000 UT) tablet Take by mouth.    cycloSPORINE (RESTASIS) 0.05 % ophthalmic emulsion PLACE ONE DROP INTO BOTH EYES TWICE A DAY    Dermatological Products, Misc. (SUVICORT) EMUL 1 application as needed    dextromethorphan-guaiFENesin (MUCINEX DM) 30-600 MG 12hr tablet Take 1 tablet by mouth 2 (two) times daily.    DHEA 25 MG CAPS Take by mouth.    Doxepin HCl  3 MG TABS Take 1 tablet (3 mg total) by mouth at bedtime as needed (sleep). (Patient not taking: No sig reported)    fexofenadine (ALLEGRA) 60 MG tablet Take by mouth.    fluconazole (DIFLUCAN) 100 MG tablet TAKE 1/2 TABLET BY MOUTH ON MONDAY, WEDNESDAY, AND FRIDAY ONLY (Patient not taking: No sig reported)    Fluticasone Furoate (ARNUITY ELLIPTA) 200 MCG/ACT AEPB INHALE 1 PUFF BY  MOUTH EVERY DAY    ipratropium (ATROVENT) 0.06 % nasal spray Place 2 sprays into both nostrils 3 (three) times daily as needed (nasal drainage). (Patient not taking: No sig reported)    lidocaine (XYLOCAINE) 2 % jelly APPLY TOPICALLY TO AFFECTED AREA EVERY DAY AS NEEDED (use sparingly)    LORazepam (ATIVAN) 0.5 MG tablet Take 1 tablet (0.5 mg total) by mouth 2 (two) times daily as needed for anxiety.    magnesium oxide (MAG-OX) 400 MG tablet Take by mouth.    melatonin 5 MG TABS Take 5 mg by mouth.    mometasone (NASONEX) 50 MCG/ACT nasal spray PLACE 2 SPRAYS INTO THE NOSE DAILY.    nystatin (MYCOSTATIN) 100000 UNIT/ML suspension Take 5 mLs (500,000 Units total) by mouth 4 (four) times daily. Swish and swallow (Patient not taking: No sig reported)    pantoprazole (PROTONIX) 40 MG tablet Take 1 tablet (40 mg total) by mouth daily.    Plecanatide (TRULANCE) 3 MG TABS 1 tablet (Patient not taking: No sig reported)    Polyethylene Glycol 3350 (MIRALAX PO) Take by mouth as needed.    Probiotic Product (PROBIOTIC PO) Take by mouth daily.    Simethicone (GAS-X PO) Take by mouth.    tizanidine (ZANAFLEX) 2 MG capsule Take 1-2 capsules (2-4 mg total) by mouth 3 (three) times daily as needed for muscle spasms.    traMADol (ULTRAM) 50 MG tablet Take 1 tablet (50 mg total) by mouth every 12 (twelve) hours as needed.    triamcinolone cream (KENALOG) 0.1 % Apply 1 application topically 2 (two) times daily.    UNABLE TO FIND Take 1 capsule by mouth daily. 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 and physical factors 06/16/2020   Inflammatory arthritis 06/16/2020   Iron deficiency anemia, unspecified 04/17/2020   Age-related osteoporosis without current pathological fracture 01/04/2019   History of adenomatous polyp of colon 07/02/2018   Malnutrition of moderate degree ([place]) 05/23/2018   Adult BMI <19 kg/sq m 04/13/2018   Incontinence of feces 04/12/2018   Pharyngitis 01/09/2018   Anxiety state 01/19/2016   Adjustment disorder with anxious mood 01/19/2016   HLD (hyperlipidemia) 01/07/2016   IBS (irritable bowel syndrome) 07/11/2015   Chronic constipation 06/22/2015   Chronic bronchitis ([place]) 05/07/2015   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   Trigeminal neuralgia 04/17/2014   Face pain 01/22/2014   Bruxism, sleep-related 08/28/2013   Asthma, chronic 05/13/2013   Generalized anxiety disorder 05/13/2013   Nonallergic rhinitis 05/13/2013   Difficulty speaking 04/11/2013   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   Leg cramps 03/23/2013   Gastroesophageal reflux disease 03/11/2013   Osteoporosis 01/30/2013  Abdominal pain 01/16/2013   Back ache 01/16/2013   Chronic interstitial cystitis 01/16/2013   FOM (frequency of micturition) 01/16/2013   Seasonal allergic rhinitis 07/07/2012   G E R D 08/15/2007   Cough 08/15/2007   VOCAL CORD POLYP, HX OF 08/15/2007    Conditions to be addressed/monitored:Anxiety, Pulmonary Disease, and osteoporosis  Care Plan : [place]  Updates made by Ilean China, RN since 01/06/2021 12:00 AM     Problem: Chronic Disease Management Needs (osteoporosis, anxiety)      Long-Range Goal: Work with [facility] Regarding Care Coordination and Care Management Associated with Chronic Disease Management Needs   Start Date: 09/02/2020  This Visit's Progress: On track  Recent Progress: On track  Priority: Medium  Note:   Current Barriers:  Chronic Disease Management support and education needs  related to Anxiety with Excessive Worry, and osteoporosis  RNCM Clinical Goal(s):  Patient will verbalize understanding of plan for management of Anxiety and osteoporosis demonstrate ongoing health management independence     Keep all medical appointments Take medications as prescribed  Interventions: 1:1 collaboration with primary care provider regarding development and update of comprehensive plan of care as evidenced by provider attestation and co-signature Inter-disciplinary care team collaboration (see longitudinal plan of care) Evaluation of current treatment plan related to self management and patient's adherence to plan as established by provider General health and multiple body systems discussed in detail Patient encouraged to follow-up with PCP and specialists Reviewed medications and supplements in detail Reviewed upcoming appointments: CPE with Dr [name] on 01/26/21 Discussed appointments and treatment with integrative medicine specialist  Anemia:  (Status: Goal on track: YES.) Assessment of understanding of anemia/bleeding disorder diagnosis Basic overview and discussion of anemia/bleeding disorder or acute disease state Medications reviewed - Counseled on importance of regular laboratory monitoring as directed by provider; - recommended promotion of rest and energy-conserving measures to manage fatigue, such as balancing activity with periods of rest - encouraged strategies to prevent falls related to fatigue, weakness and dizziness; encouraged sitting before standing and using an assistive device - encouraged optimal oral intake to support fluid balance and nutrition - encouraged dietary changes to increase dietary intake of iron, Vitamin O17 and folic acid as advised/prescribed - Assessed social determinant of health barriers;   Discussed iron supplement intake and constipation Encouraged to take with vitamin C for absorption Reviewed OTC medications to help with  constipation Encouraged intake of whole foods containing fiber and supplement if necessary. Adequate water intake needed with insoluable fiber supplementation.  Extensively discussed diet Extensively discussed recent lab results that were available for review in [facility] and normal hgb, b12, and iron levels  Osteoporosis(Status: Goal on track: YES.)  Chart reviewed including relevant office notes, lab results, and imaging reports Discussed reclast infusion at [facility] for management of osteoporosis  Reviewed appt for next year for 2nd infusion Encouraged intake of calcium and vitamin D through diet and supplements Fall precautions reviewed and encouraged patient to move carefully to avoid falls Advised dexa scan is due at least every 2 years Printed dexa scan and asked front office staff to mail to patient for her records  Anxiety(Status: Goal on track: YES.)  Encouraged to talk with LCSW regarding psychosocial concerns Therapeutic listening utilized regarding health anxiety associated with chronic medical conditions, current health state, recent specialty visits, and current symptoms with unknown causes  Patient Goals/Self-Care Activities: Patient will self administer medications as prescribed Patient will call pharmacy for medication refills Patient will  continue to perform ADL's independently Patient will continue to perform IADL's independently Patient will call provider office for new concerns or questions       Plan:Telephone follow up appointment with care management team member scheduled for:  03/10/21 with RNCM and The patient has been provided with contact information for the care management team and has been advised to call with any health related questions or concerns.   Chong Sicilian, BSN, RN-BC Embedded Chronic Care Manager [facility] / [place] [facility] Direct Dial: [phone]

## 2021-01-06 NOTE — Patient Instructions (Signed)
Visit Information  PATIENT GOALS:  Goals Addressed             This Visit's Progress    Work with RNCM Re: Chronic Disease Management Needs   On track    Timeframe:  Long-Range Goal Priority:  Medium Start Date:                             Expected End Date:                       Follow-up: 03/10/21 with RNCM  Patient will attend all scheduled provider appointments Patient will continue to perform ADL's independently Patient will continue to perform IADL's independently Patient will call provider office for new concerns or questions Talk with LCSW regarding anxiety and psychosocial concerns Call RN Care Manager as needed [phone]         Patient verbalizes understanding of instructions provided today and agrees to view in [place].   Plan:Telephone follow up appointment with care management team member scheduled for:  03/10/21 with RNCM and The patient has been provided with contact information for the care management team and has been advised to call with any health related questions or concerns.   Chong Sicilian, BSN, RN-BC Embedded Chronic Care Manager [facility] [place] [facility] Direct Dial: [phone]

## 2021-01-13 ENCOUNTER — Telehealth: Payer: Self-pay | Admitting: Family Medicine

## 2021-01-13 DIAGNOSIS — R252 Cramp and spasm: Secondary | ICD-10-CM

## 2021-01-13 NOTE — Telephone Encounter (Signed)
Ok to order BMP, magnesium, ferritin level

## 2021-01-14 ENCOUNTER — Telehealth: Payer: Self-pay | Admitting: *Deleted

## 2021-01-14 ENCOUNTER — Other Ambulatory Visit: Payer: Medicare HMO

## 2021-01-14 ENCOUNTER — Other Ambulatory Visit: Payer: Self-pay

## 2021-01-14 DIAGNOSIS — M5137 Other intervertebral disc degeneration, lumbosacral region: Secondary | ICD-10-CM | POA: Diagnosis not present

## 2021-01-14 DIAGNOSIS — M9901 Segmental and somatic dysfunction of cervical region: Secondary | ICD-10-CM | POA: Diagnosis not present

## 2021-01-14 DIAGNOSIS — M9902 Segmental and somatic dysfunction of thoracic region: Secondary | ICD-10-CM | POA: Diagnosis not present

## 2021-01-14 DIAGNOSIS — R252 Cramp and spasm: Secondary | ICD-10-CM | POA: Diagnosis not present

## 2021-01-14 DIAGNOSIS — M9903 Segmental and somatic dysfunction of lumbar region: Secondary | ICD-10-CM | POA: Diagnosis not present

## 2021-01-14 DIAGNOSIS — R6889 Other general symptoms and signs: Secondary | ICD-10-CM | POA: Diagnosis not present

## 2021-01-14 NOTE — Telephone Encounter (Signed)
Referral order placed through epic on 01/05/21 to ENT Dr. Wilburn Cornelia per Dr. Maudie Mercury. Referral request faxed as well.

## 2021-01-15 LAB — BMP8+EGFR
BUN/Creatinine Ratio: 7 — ABNORMAL LOW (ref 12–28)
BUN: 6 mg/dL — ABNORMAL LOW (ref 8–27)
CO2: 21 mmol/L (ref 20–29)
Calcium: 9.3 mg/dL (ref 8.7–10.3)
Chloride: 99 mmol/L (ref 96–106)
Creatinine, Ser: 0.82 mg/dL (ref 0.57–1.00)
Glucose: 91 mg/dL (ref 70–99)
Potassium: 4.4 mmol/L (ref 3.5–5.2)
Sodium: 133 mmol/L — ABNORMAL LOW (ref 134–144)
eGFR: 77 mL/min/{1.73_m2} (ref >59)

## 2021-01-15 LAB — MAGNESIUM: Magnesium: 2 mg/dL (ref 1.6–2.3)

## 2021-01-15 LAB — FERRITIN: Ferritin: 45 ng/mL (ref 15–150)

## 2021-01-18 ENCOUNTER — Ambulatory Visit (INDEPENDENT_AMBULATORY_CARE_PROVIDER_SITE_OTHER): Payer: Medicare HMO | Admitting: Nurse Practitioner

## 2021-01-18 ENCOUNTER — Other Ambulatory Visit: Payer: Self-pay

## 2021-01-18 ENCOUNTER — Encounter: Payer: Self-pay | Admitting: Nurse Practitioner

## 2021-01-18 VITALS — BP 122/74 | HR 68 | Temp 98.5°F | Ht 68.5 in | Wt 109.0 lb

## 2021-01-18 DIAGNOSIS — R252 Cramp and spasm: Secondary | ICD-10-CM | POA: Diagnosis not present

## 2021-01-18 NOTE — Assessment & Plan Note (Signed)
Ongoing  intermittent  leg cramps not well controlled, patient is dehydrated and not drinking enough water, dicussed increase hydration and electrolytes, last labs normal for potassium.

## 2021-01-18 NOTE — Progress Notes (Signed)
Acute Office Visit  Subjective:    Patient ID: Kristy Garza, female    DOB: 02/07/1951, 70 y.o.   MRN: [id number]  Chief Complaint  Patient presents with   leg cramps    Leg Pain  The incident occurred more than 1 week ago. The incident occurred at home. There was no injury mechanism. The pain is present in the left leg. The quality of the pain is described as aching. The pain is moderate. She reports no foreign bodies present.     Past Medical History:  Diagnosis Date   Acid reflux    Allergy    Arthritis    ARTHRITIS IN NECK BY DR. Alroy Dust [facility]   Asthma    Interstitial cystitis    Osteoporosis    PONV (postoperative nausea and vomiting)    Tinnitus    Trigeminal neuralgia    Atypical trigeminal neuralgia    Past Surgical History:  Procedure Laterality Date   ABDOMINAL HYSTERECTOMY  2000   CYSTOSCOPY WITH HYDRODISTENSION AND BIOPSY N/A 06/11/2012   Procedure: CYSTOSCOPY/BIOPSY/HYDRODISTENSION with Instillation of Pyridium and Marcaine and Kenalog;  Surgeon: Ailene Rud, MD;  Location: [facility];  Service: Urology;  Laterality: N/A;  1 hour requested for this case  BLADDER BIOPSY   ETHMOIDECTOMY  2012   SEPTOPLASTY  1980's   vocal cord surgery   1990's   polyp removal    Family History  Problem Relation Age of Onset   Hyperlipidemia Mother    Arthritis Mother    Cancer Mother    Lymphoma Mother    Cancer Father    Allergies Father    Allergies Sister    Allergies Sister    Allergic rhinitis Neg Hx    Angioedema Neg Hx    Asthma Neg Hx    Eczema Neg Hx    Immunodeficiency Neg Hx    Urticaria Neg Hx     Social History   Socioeconomic History   Marital status: Married    Spouse name: [name]   Number of children: 3   Years of education: 12   Highest education level: Some college, no degree  Occupational History   Occupation: CNA    Comment: Retired  Tobacco Use   Smoking status: Never   Smokeless tobacco: Never   Vaping Use   Vaping Use: Never used  Substance and Sexual Activity   Alcohol use: No    Alcohol/week: 0.0 standard drinks    Comment: 01-19-2016 per pt no   Drug use: No    Comment: 01-19-2016 per pt no    Sexual activity: Not Currently    Birth control/protection: Surgical  Other Topics Concern   Not on file  Social History Narrative   Lives with husband.    Social Determinants of Health   Financial Resource Strain: Not on file  Food Insecurity: Not on file  Transportation Needs: Not on file  Physical Activity: Not on file  Stress: Not on file  Social Connections: Not on file  Intimate Partner Violence: Not on file    Outpatient Medications Prior to Visit  Medication Sig Dispense Refill   acetaminophen (TYLENOL) 500 MG tablet Take by mouth.     albuterol (VENTOLIN HFA) 108 (90 Base) MCG/ACT inhaler USE 2 PUFFS EVERY 4 HOURS AS NEEDED FOR COUGH, WHEEZE, TIGHTNESS IN CHEST, OR SHORTNESS OF BREATH 18 each 1   aspirin-acetaminophen-caffeine (EXCEDRIN MIGRAINE) 250-250-65 MG tablet Take by mouth.     b complex vitamins  capsule Take 1 capsule by mouth daily.     calcium citrate-vitamin D (CITRACAL+D) 315-200 MG-UNIT tablet Take by mouth.     Cholecalciferol 25 MCG (1000 UT) tablet Take by mouth.     cycloSPORINE (RESTASIS) 0.05 % ophthalmic emulsion PLACE ONE DROP INTO BOTH EYES TWICE A DAY     Dermatological Products, Misc. (SUVICORT) EMUL 1 application as needed     dextromethorphan-guaiFENesin (MUCINEX DM) 30-600 MG 12hr tablet Take 1 tablet by mouth 2 (two) times daily.     DHEA 25 MG CAPS Take by mouth.     Doxepin HCl 3 MG TABS Take 1 tablet (3 mg total) by mouth at bedtime as needed (sleep). 30 tablet 5   fexofenadine (ALLEGRA) 60 MG tablet Take by mouth.     fluconazole (DIFLUCAN) 100 MG tablet TAKE 1/2 TABLET BY MOUTH ON MONDAY, WEDNESDAY, AND FRIDAY ONLY     Fluticasone Furoate (ARNUITY ELLIPTA) 200 MCG/ACT AEPB INHALE 1 PUFF BY MOUTH EVERY DAY 30 each 5   ipratropium  (ATROVENT) 0.06 % nasal spray Place 2 sprays into both nostrils 3 (three) times daily as needed (nasal drainage). 15 mL 5   lidocaine (XYLOCAINE) 2 % jelly APPLY TOPICALLY TO AFFECTED AREA EVERY DAY AS NEEDED (use sparingly) 85 g 0   LORazepam (ATIVAN) 0.5 MG tablet Take 1 tablet (0.5 mg total) by mouth 2 (two) times daily as needed for anxiety. 30 tablet 1   magnesium oxide (MAG-OX) 400 MG tablet Take by mouth.     melatonin 5 MG TABS Take 5 mg by mouth.     mometasone (NASONEX) 50 MCG/ACT nasal spray PLACE 2 SPRAYS INTO THE NOSE DAILY. 17 g 5   nystatin (MYCOSTATIN) 100000 UNIT/ML suspension Take 5 mLs (500,000 Units total) by mouth 4 (four) times daily. Swish and swallow 60 mL 0   pantoprazole (PROTONIX) 40 MG tablet Take 1 tablet (40 mg total) by mouth daily. 30 tablet 5   Plecanatide (TRULANCE) 3 MG TABS 1 tablet     Polyethylene Glycol 3350 (MIRALAX PO) Take by mouth as needed.     Probiotic Product (PROBIOTIC PO) Take by mouth daily.     Simethicone (GAS-X PO) Take by mouth.     tizanidine (ZANAFLEX) 2 MG capsule Take 1-2 capsules (2-4 mg total) by mouth 3 (three) times daily as needed for muscle spasms. 60 capsule 2   traMADol (ULTRAM) 50 MG tablet Take 1 tablet (50 mg total) by mouth every 12 (twelve) hours as needed. 30 tablet 1   triamcinolone cream (KENALOG) 0.1 % Apply 1 application topically 2 (two) times daily. 30 g 0   UNABLE TO FIND Take 1 capsule by mouth daily. Dv3- vitamin D 3 plus immune support     Facility-Administered Medications Prior to Visit  Medication Dose Route Frequency Provider Last Rate Last Admin   zoledronic acid (RECLAST) injection 5 mg  5 mg Intravenous Once Gottschalk, Ashly M, DO        Allergies  Allergen Reactions   Cefuroxime Axetil Other (See Comments)    Extreme gas   Gabapentin Other (See Comments)    Constipation Constipation   Montelukast Other (See Comments)    Numbness and tingling in hand. Numbness and tingling in hand. Numbness and  tingling in hand. NUMBNESS OF HANDS AND FEET. Numbness in hands and feet NUMBNESS OF HANDS AND FEET.   Pregabalin Other (See Comments)    Drowsiness, dry mouth Drowsiness, dry mouth Makes her tired and thirsty  Augmentin [Amoxicillin-Pot Clavulanate] Diarrhea   Avelox [Moxifloxacin Hcl In Nacl] Other (See Comments)    Tremors    Biaxin [Clarithromycin]     Unsure of reaction   Cedax [Ceftibuten] Other (See Comments)    tremors   Ciprofloxacin Other (See Comments) and Nausea And Vomiting    Blurred vision Blurred vision Pt can't remember reaction   Clindamycin/Lincomycin     tachycardia   Levofloxacin Other (See Comments)    Feels dehydrated   Lincomycin Other (See Comments)    tachycardia   Montelukast Sodium Other (See Comments)    Numbness and tingling in hand. Numbness and tingling in hand.   Sulfonamide Derivatives Other (See Comments)    Gi upset   Sulfa Antibiotics Nausea Only    Other reaction(s): Other (See Comments) Gi upset  Other reaction(s): Other (See Comments) Gi upset Other reaction(s): Unknown    Review of Systems  HENT: Negative.    Eyes: Negative.   Cardiovascular: Negative.   Gastrointestinal: Negative.   Genitourinary: Negative.   Musculoskeletal:  Positive for myalgias.  All other systems reviewed and are negative.     Objective:    Physical Exam Vitals and nursing note reviewed.  Constitutional:      Appearance: Normal appearance.  HENT:     Head: Normocephalic.     Mouth/Throat:     Mouth: Mucous membranes are moist.     Pharynx: Oropharynx is clear.  Eyes:     Conjunctiva/sclera: Conjunctivae normal.  Cardiovascular:     Rate and Rhythm: Normal rate and regular rhythm.  Pulmonary:     Effort: Pulmonary effort is normal.     Breath sounds: Normal breath sounds.  Musculoskeletal:     Right lower leg: Tenderness present. No swelling.     Left lower leg: Tenderness present. No swelling.  Neurological:     Mental Status: She  is alert and oriented to person, place, and time.  Psychiatric:        Behavior: Behavior normal.    BP 122/74   Pulse 68   Temp 98.5 F (36.9 C) (Temporal)   Ht 5' 8.5" (1.74 m)   Wt 109 lb (49.4 kg)   SpO2 97%   BMI 16.33 kg/m  Wt Readings from Last 3 Encounters:  01/18/21 109 lb (49.4 kg)  01/05/21 112 lb 12.8 oz (51.2 kg)  12/17/20 113 lb 6.4 oz (51.4 kg)    Health Maintenance Due  Topic Date Due   MAMMOGRAM  12/20/2017   COVID-19 Vaccine (3 - Pfizer risk series) 03/04/2020   INFLUENZA VACCINE  10/26/2020    There are no preventive care reminders to display for this patient.   Lab Results  Component Value Date   TSH 0.326 (L) 06/15/2020   Lab Results  Component Value Date   WBC 3.8 07/08/2020   HGB 11.4 07/08/2020   HCT 34.1 07/08/2020   MCV 88 07/08/2020   PLT 214 07/08/2020   Lab Results  Component Value Date   NA 133 (L) 01/14/2021   K 4.4 01/14/2021   CO2 21 01/14/2021   GLUCOSE 91 01/14/2021   BUN 6 (L) 01/14/2021   CREATININE 0.82 01/14/2021   BILITOT 0.3 06/15/2020   ALKPHOS 79 06/15/2020   AST 25 06/15/2020   ALT 16 06/15/2020   PROT 7.1 06/15/2020   ALBUMIN 4.5 06/15/2020   CALCIUM 9.3 01/14/2021   EGFR 77 01/14/2021   GFR 93.08 03/25/2013   Lab Results  Component Value Date  CHOL 176 11/18/2019   Lab Results  Component Value Date   HDL 70 11/18/2019   Lab Results  Component Value Date   LDLCALC 96 11/18/2019   Lab Results  Component Value Date   TRIG 47 11/18/2019   Lab Results  Component Value Date   CHOLHDL 2.5 11/18/2019   Lab Results  Component Value Date   HGBA1C 5.1 06/15/2020       Assessment & Plan:   Problem List Items Addressed This Visit       Other   Leg cramps - Primary    Ongoing  intermittent  leg cramps not well controlled, patient is dehydrated and not drinking enough water, dicussed increase hydration and electrolytes, last labs normal for potassium.         No orders of the defined  types were placed in this encounter.    Ivy Lynn, NP

## 2021-01-18 NOTE — Patient Instructions (Signed)
Leg Cramps Leg cramps occur when one or more muscles tighten and a person has no control over it (involuntary muscle contraction). Muscle cramps are most common in the calf muscles of the leg. They can occur during exercise or at rest. Leg cramps are painful, and they may last for a few seconds to a few minutes. Cramps may return several times before they finallystop. Usually, leg cramps are not caused by a serious medical problem. In many cases, the cause is not known. Some common causes include: Excessive physical effort (overexertion), such as during intense exercise. Doing the same motion over and over. Staying in a certain position for a long period of time. Improper preparation, form, or technique while doing a sport or an activity. Dehydration. Injury. Side effects of certain medicines. Abnormally low levels of minerals in your blood (electrolytes), especially potassium and calcium. This could result from: Pregnancy. Taking diuretic medicines. Follow these instructions at home: Eating and drinking Drink enough fluid to keep your urine pale yellow. Staying hydrated may help prevent cramps. Eat a healthy diet that includes plenty of nutrients to help your muscles function. A healthy diet includes fruits and vegetables, lean protein, whole grains, and low-fat or nonfat dairy products. Managing pain, stiffness, and swelling     Try massaging, stretching, and relaxing the affected muscle. Do this for several minutes at a time. If directed, put ice on areas that are sore or painful after a cramp. To do this: Put ice in a plastic bag. Place a towel between your skin and the bag. Leave the ice on for 20 minutes, 2-3 times a day. Remove the ice if your skin turns bright red. This is very important. If you cannot feel pain, heat, or cold, you have a greater risk of damage to the area. If directed, apply heat to muscles that are tense or tight. Do this before you exercise, or as often as told  by your health care provider. Use the heat source that your health care provider recommends, such as a moist heat pack or a heating pad. To do this: Place a towel between your skin and the heat source. Leave the heat on for 20-30 minutes. Remove the heat if your skin turns bright red. This is especially important if you are unable to feel pain, heat, or cold. You may have a greater risk of getting burned. Try taking hot showers or baths to help relax tight muscles. General instructions If you are having frequent leg cramps, avoid intense exercise for several days. Take over-the-counter and prescription medicines only as told by your health care provider. Keep all follow-up visits. This is important. Contact a health care provider if: Your leg cramps get more severe or more frequent, or they do not improve over time. Your foot becomes cold, numb, or blue. Summary Muscle cramps can develop in any muscle, but the most common place is in the calf muscles of the leg. Leg cramps are painful, and they may last for a few seconds to a few minutes. Usually, leg cramps are not caused by a serious medical problem. Often, the cause is not known. Stay hydrated, and take over-the-counter and prescription medicines only as told by your health care provider. This information is not intended to replace advice given to you by your health care provider. Make sure you discuss any questions you have with your healthcare provider. Document Revised: 07/31/2019 Document Reviewed: 07/31/2019 Elsevier Patient Education  2022 [employer] Inc.  

## 2021-01-19 ENCOUNTER — Telehealth: Payer: Self-pay | Admitting: Family Medicine

## 2021-01-19 ENCOUNTER — Encounter: Payer: Medicare HMO | Admitting: Family Medicine

## 2021-01-19 ENCOUNTER — Other Ambulatory Visit: Payer: Self-pay | Admitting: Family Medicine

## 2021-01-19 DIAGNOSIS — R7989 Other specified abnormal findings of blood chemistry: Secondary | ICD-10-CM

## 2021-01-19 DIAGNOSIS — E876 Hypokalemia: Secondary | ICD-10-CM

## 2021-01-19 NOTE — Telephone Encounter (Signed)
done

## 2021-01-21 DIAGNOSIS — M9903 Segmental and somatic dysfunction of lumbar region: Secondary | ICD-10-CM | POA: Diagnosis not present

## 2021-01-21 DIAGNOSIS — M5137 Other intervertebral disc degeneration, lumbosacral region: Secondary | ICD-10-CM | POA: Diagnosis not present

## 2021-01-21 DIAGNOSIS — M9902 Segmental and somatic dysfunction of thoracic region: Secondary | ICD-10-CM | POA: Diagnosis not present

## 2021-01-21 DIAGNOSIS — M9901 Segmental and somatic dysfunction of cervical region: Secondary | ICD-10-CM | POA: Diagnosis not present

## 2021-01-25 DIAGNOSIS — J45909 Unspecified asthma, uncomplicated: Secondary | ICD-10-CM | POA: Diagnosis not present

## 2021-01-25 DIAGNOSIS — M810 Age-related osteoporosis without current pathological fracture: Secondary | ICD-10-CM

## 2021-01-26 ENCOUNTER — Encounter: Payer: Self-pay | Admitting: Family Medicine

## 2021-01-26 ENCOUNTER — Other Ambulatory Visit: Payer: Self-pay

## 2021-01-26 ENCOUNTER — Ambulatory Visit (INDEPENDENT_AMBULATORY_CARE_PROVIDER_SITE_OTHER): Payer: Medicare HMO | Admitting: Family Medicine

## 2021-01-26 VITALS — BP 97/65 | HR 70 | Temp 97.6°F | Ht 68.5 in | Wt 110.8 lb

## 2021-01-26 DIAGNOSIS — E440 Moderate protein-calorie malnutrition: Secondary | ICD-10-CM

## 2021-01-26 DIAGNOSIS — E871 Hypo-osmolality and hyponatremia: Secondary | ICD-10-CM | POA: Diagnosis not present

## 2021-01-26 DIAGNOSIS — Z0000 Encounter for general adult medical examination without abnormal findings: Secondary | ICD-10-CM

## 2021-01-26 DIAGNOSIS — E876 Hypokalemia: Secondary | ICD-10-CM | POA: Diagnosis not present

## 2021-01-26 DIAGNOSIS — F411 Generalized anxiety disorder: Secondary | ICD-10-CM

## 2021-01-26 DIAGNOSIS — R69 Illness, unspecified: Secondary | ICD-10-CM | POA: Diagnosis not present

## 2021-01-26 DIAGNOSIS — R7989 Other specified abnormal findings of blood chemistry: Secondary | ICD-10-CM

## 2021-01-26 DIAGNOSIS — Z0001 Encounter for general adult medical examination with abnormal findings: Secondary | ICD-10-CM | POA: Diagnosis not present

## 2021-01-26 MED ORDER — LORAZEPAM 0.5 MG PO TABS: 0.5000 mg | ORAL_TABLET | Freq: Two times a day (BID) | ORAL | 5 refills | Status: DC | PRN

## 2021-01-26 NOTE — Patient Instructions (Signed)
Protein-Energy Malnutrition Protein-energy malnutrition is when a person does not eat enough protein, fat, and calories. When this happens over time, it can lead to severe loss of muscle tissue (muscle wasting). This condition also affects the body's defense system (immune system) and can lead to other health problems. What are the causes? This condition may be caused by: Not eating enough protein, fat, or calories. Having certain chronic medical conditions. Eating too little. What increases the risk? The following factors may make you more likely to develop this condition: Living in poverty. Long-term hospitalization. Alcohol or drug dependency. Addiction often leads to a lifestyle in which proper diet is ignored. Dependency can also hurt the metabolism and the body's ability to absorb nutrients. Eating disorders, such as anorexia nervosa or bulimia. Chewing or swallowing problems. People with these disorders may not eat enough. Having certain conditions, such as: Inflammatory bowel disease. Inflammation of the intestines makes it difficult for the body to absorb nutrients. Cancer or AIDS. These diseases can cause a loss of appetite. Chronic heart failure. This interferes with how the body uses nutrients. Cystic fibrosis. This disease can make it difficult for the body to absorb nutrients. Eating a diet that extremely restricts protein, fat, or calorie intake. What are the signs or symptoms? Symptoms of this condition include: Tiredness (fatigue). Weakness. Dizziness. Fainting. Weight loss. Loss of muscle tone and muscle mass. Poor immune response. Lack of menstruation. Poor memory. Hair loss. Skin changes. How is this diagnosed? This condition may be diagnosed based on: Your medical and dietary history. A physical exam. This may include a measurement of your body mass index. Blood tests. How is this treated? This condition may be managed with: Nutrition therapy. This may  include working with a dietitian. Treatment for underlying conditions. People with severe protein-energy malnutrition may need to be treated in a hospital. This may involve receiving nutrition and fluids through an IV. Follow these instructions at home:  Eat a balanced diet. In each meal, include at least one food that is high in protein. Foods that are high in protein include: Meat. Poultry. Fish. Eggs. Cheese. Milk. Beans. Nuts. Eat nutrient-rich foods that are easy to swallow and digest, such as: Fruit and yogurt smoothies. Oatmeal with nut butter. Nutrition supplement drinks. Try to eat six small meals each day instead of three large meals. Take vitamin and protein supplements as told by your health care provider or dietitian. Follow your health care provider's recommendations about exercise and activity. Keep all follow-up visits. This is important. Contact a health care provider if: You have increased weakness or fatigue. You faint. You are a woman and you stop having your period (menstruating). You have rapid hair loss. You have unexpected weight loss. You have diarrhea. You have nausea and vomiting. Get help right away if: You have difficulty breathing. You have chest pain. These symptoms may represent a serious problem that is an emergency. Do not wait to see if the symptoms will go away. Get medical help right away. Call your local emergency services (911 in the [place] Do not drive yourself to the hospital. Summary Protein-energy malnutrition is when a person does not eat enough protein, fat, and calories. Protein-energy malnutrition can lead to severe loss of muscle tissue (muscle wasting). This condition also affects the body's defense system (immune system) and can lead to other health problems. Talk with your health care provider about treatment for this condition. Effective treatment depends on the underlying cause of the malnutrition. This information is not    intended to replace advice given to you by your health care provider. Make sure you discuss any questions you have with your health care provider. Document Revised: 03/14/2020 Document Reviewed: 03/14/2020 Elsevier Patient Education  2022 Elsevier Inc.  

## 2021-01-26 NOTE — Progress Notes (Signed)
Kristy Garza is a 70 y.o. female presents to office today for annual physical exam examination.    Concerns today include: 1.  No new concerns  Occupation: Retired, Marital status: Married, Substance use: None Diet: Very restricted due to multiple allergies and recommendation by her integrative specialist, Exercise: No structured Last colonoscopy: Up-to-date Last mammogram: Scheduled Last pap smear: N/A Refills needed today: Ativan Immunizations needed: Immunization History  Administered Date(s) Administered   Influenza,inj,Quad PF,6+ Mos 12/28/2012, 12/25/2013, 12/26/2014   PFIZER(Purple Top)SARS-COV-2 Vaccination 01/01/2020, 02/05/2020   Pneumococcal Conjugate-13 02/07/2013   Pneumococcal Polysaccharide-23 09/07/2017   Tdap 05/21/2010   Zoster, Live 03/27/2012     Past Medical History:  Diagnosis Date   Acid reflux    Allergy    Arthritis    ARTHRITIS IN NECK BY DR. Alroy Dust [facility]   Asthma    Interstitial cystitis    Osteoporosis    PONV (postoperative nausea and vomiting)    Tinnitus    Trigeminal neuralgia    Atypical trigeminal neuralgia   Social History   Socioeconomic History   Marital status: Married    Spouse name: [name]   Number of children: 3   Years of education: 12   Highest education level: Some college, no degree  Occupational History   Occupation: CNA    Comment: Retired  Tobacco Use   Smoking status: Never   Smokeless tobacco: Never  Vaping Use   Vaping Use: Never used  Substance and Sexual Activity   Alcohol use: No    Alcohol/week: 0.0 standard drinks    Comment: 01-19-2016 per pt no   Drug use: No    Comment: 01-19-2016 per pt no    Sexual activity: Not Currently    Birth control/protection: Surgical  Other Topics Concern   Not on file  Social History Narrative   Lives with husband.    Social Determinants of Health   Financial Resource Strain: Not on file  Food Insecurity: Not on file  Transportation Needs: Not on file   Physical Activity: Not on file  Stress: Not on file  Social Connections: Not on file  Intimate Partner Violence: Not on file   Past Surgical History:  Procedure Laterality Date   ABDOMINAL HYSTERECTOMY  2000   CYSTOSCOPY WITH HYDRODISTENSION AND BIOPSY N/A 06/11/2012   Procedure: CYSTOSCOPY/BIOPSY/HYDRODISTENSION with Instillation of Pyridium and Marcaine and Kenalog;  Surgeon: Ailene Rud, MD;  Location: [facility];  Service: Urology;  Laterality: N/A;  1 hour requested for this case  BLADDER BIOPSY   ETHMOIDECTOMY  2012   SEPTOPLASTY  1980's   vocal cord surgery   1990's   polyp removal   Family History  Problem Relation Age of Onset   Hyperlipidemia Mother    Arthritis Mother    Cancer Mother    Lymphoma Mother    Cancer Father    Allergies Father    Allergies Sister    Allergies Sister    Allergic rhinitis Neg Hx    Angioedema Neg Hx    Asthma Neg Hx    Eczema Neg Hx    Immunodeficiency Neg Hx    Urticaria Neg Hx     Current Outpatient Medications:    acetaminophen (TYLENOL) 500 MG tablet, Take by mouth., Disp: , Rfl:    albuterol (VENTOLIN HFA) 108 (90 Base) MCG/ACT inhaler, USE 2 PUFFS EVERY 4 HOURS AS NEEDED FOR COUGH, WHEEZE, TIGHTNESS IN CHEST, OR SHORTNESS OF BREATH, Disp: 18 each, Rfl: 1  aspirin-acetaminophen-caffeine (EXCEDRIN MIGRAINE) 250-250-65 MG tablet, Take by mouth., Disp: , Rfl:    b complex vitamins capsule, Take 1 capsule by mouth daily., Disp: , Rfl:    calcium citrate-vitamin D (CITRACAL+D) 315-200 MG-UNIT tablet, Take by mouth., Disp: , Rfl:    Cholecalciferol 25 MCG (1000 UT) tablet, Take by mouth., Disp: , Rfl:    cycloSPORINE (RESTASIS) 0.05 % ophthalmic emulsion, PLACE ONE DROP INTO BOTH EYES TWICE A DAY, Disp: , Rfl:    Dermatological Products, Misc. (SUVICORT) EMUL, 1 application as needed, Disp: , Rfl:    dextromethorphan-guaiFENesin (MUCINEX DM) 30-600 MG 12hr tablet, Take 1 tablet by mouth 2 (two) times daily.,  Disp: , Rfl:    DHEA 25 MG CAPS, Take by mouth., Disp: , Rfl:    Doxepin HCl 3 MG TABS, Take 1 tablet (3 mg total) by mouth at bedtime as needed (sleep)., Disp: 30 tablet, Rfl: 5   fexofenadine (ALLEGRA) 60 MG tablet, Take by mouth., Disp: , Rfl:    fluconazole (DIFLUCAN) 100 MG tablet, TAKE 1/2 TABLET BY MOUTH ON MONDAY, WEDNESDAY, AND FRIDAY ONLY, Disp: , Rfl:    Fluticasone Furoate (ARNUITY ELLIPTA) 200 MCG/ACT AEPB, INHALE 1 PUFF BY MOUTH EVERY DAY, Disp: 30 each, Rfl: 5   ipratropium (ATROVENT) 0.06 % nasal spray, Place 2 sprays into both nostrils 3 (three) times daily as needed (nasal drainage)., Disp: 15 mL, Rfl: 5   lidocaine (XYLOCAINE) 2 % jelly, APPLY TOPICALLY TO AFFECTED AREA EVERY DAY AS NEEDED (use sparingly), Disp: 85 g, Rfl: 0   LORazepam (ATIVAN) 0.5 MG tablet, Take 1 tablet (0.5 mg total) by mouth 2 (two) times daily as needed for anxiety., Disp: 30 tablet, Rfl: 1   magnesium oxide (MAG-OX) 400 MG tablet, Take by mouth., Disp: , Rfl:    melatonin 5 MG TABS, Take 5 mg by mouth., Disp: , Rfl:    mometasone (NASONEX) 50 MCG/ACT nasal spray, PLACE 2 SPRAYS INTO THE NOSE DAILY., Disp: 17 g, Rfl: 5   nystatin (MYCOSTATIN) 100000 UNIT/ML suspension, Take 5 mLs (500,000 Units total) by mouth 4 (four) times daily. Swish and swallow, Disp: 60 mL, Rfl: 0   pantoprazole (PROTONIX) 40 MG tablet, Take 1 tablet (40 mg total) by mouth daily., Disp: 30 tablet, Rfl: 5   Plecanatide (TRULANCE) 3 MG TABS, 1 tablet, Disp: , Rfl:    Polyethylene Glycol 3350 (MIRALAX PO), Take by mouth as needed., Disp: , Rfl:    Probiotic Product (PROBIOTIC PO), Take by mouth daily., Disp: , Rfl:    Simethicone (GAS-X PO), Take by mouth., Disp: , Rfl:    tizanidine (ZANAFLEX) 2 MG capsule, Take 1-2 capsules (2-4 mg total) by mouth 3 (three) times daily as needed for muscle spasms., Disp: 60 capsule, Rfl: 2   traMADol (ULTRAM) 50 MG tablet, Take 1 tablet (50 mg total) by mouth every 12 (twelve) hours as needed., Disp:  30 tablet, Rfl: 1   triamcinolone cream (KENALOG) 0.1 %, Apply 1 application topically 2 (two) times daily., Disp: 30 g, Rfl: 0   UNABLE TO FIND, Take 1 capsule by mouth daily. Dv3- vitamin D 3 plus immune support, Disp: , Rfl:   Current Facility-Administered Medications:    zoledronic acid (RECLAST) injection 5 mg, 5 mg, Intravenous, Once, Jayline Kilburg M, DO  Allergies  Allergen Reactions   Cefuroxime Axetil Other (See Comments)    Extreme gas   Gabapentin Other (See Comments)    Constipation Constipation   Montelukast Other (See Comments)  Numbness and tingling in hand. Numbness and tingling in hand. Numbness and tingling in hand. NUMBNESS OF HANDS AND FEET. Numbness in hands and feet NUMBNESS OF HANDS AND FEET.   Pregabalin Other (See Comments)    Drowsiness, dry mouth Drowsiness, dry mouth Makes her tired and thirsty   Augmentin [Amoxicillin-Pot Clavulanate] Diarrhea   Avelox [Moxifloxacin Hcl In Nacl] Other (See Comments)    Tremors    Biaxin [Clarithromycin]     Unsure of reaction   Cedax [Ceftibuten] Other (See Comments)    tremors   Ciprofloxacin Other (See Comments) and Nausea And Vomiting    Blurred vision Blurred vision Pt can't remember reaction   Clindamycin/Lincomycin     tachycardia   Levofloxacin Other (See Comments)    Feels dehydrated   Lincomycin Other (See Comments)    tachycardia   Montelukast Sodium Other (See Comments)    Numbness and tingling in hand. Numbness and tingling in hand.   Sulfonamide Derivatives Other (See Comments)    Gi upset   Sulfa Antibiotics Nausea Only    Other reaction(s): Other (See Comments) Gi upset  Other reaction(s): Other (See Comments) Gi upset Other reaction(s): Unknown     ROS: Review of Systems Pertinent items noted in HPI and remainder of comprehensive ROS otherwise negative.    Physical exam BP 97/65   Pulse 70   Temp 97.6 F (36.4 C)   Ht 5' 8.5" (1.74 m)   Wt 110 lb 12.8 oz (50.3 kg)    SpO2 99%   BMI 16.60 kg/m  General appearance: alert, cooperative, appears stated age, no distress, and underweight Head: Normocephalic, without obvious abnormality, atraumatic Eyes: negative findings: lids and lashes normal, conjunctivae and sclerae normal, corneas clear, and pupils equal, round, reactive to light and accomodation Ears: normal TM's and external ear canals both ears Nose: Nares normal. Septum midline. Mucosa normal. No drainage or sinus tenderness. Throat:  Oropharynx without masses.  Dentition fair Neck: no adenopathy, supple, symmetrical, trachea midline, and thyroid not enlarged, symmetric, no tenderness/mass/nodules Back: symmetric, no curvature. ROM normal. No CVA tenderness. Lungs: clear to auscultation bilaterally Heart: regular rate and rhythm, S1, S2 normal, no murmur, click, rub or gallop Abdomen: soft, non-tender; bowel sounds normal; no masses,  no organomegaly Extremities: extremities normal, atraumatic, no cyanosis or edema Pulses: 2+ and symmetric Skin: Skin color, texture, turgor normal. No rashes or lesions Lymph nodes: Cervical, supraclavicular, and axillary nodes normal. Neurologic: Grossly normal Psych: Mood stable, speech normal  Depression screen [facility] 2/9 01/26/2021 01/18/2021 12/02/2020  Decreased Interest 0 0 0  Down, Depressed, Hopeless 0 0 0  PHQ - 2 Score 0 0 0  Altered sleeping 1 - -  Tired, decreased energy 1 - -  Change in appetite 0 - -  Feeling bad or failure about yourself  0 - -  Trouble concentrating 0 - -  Moving slowly or fidgety/restless 0 - -  Suicidal thoughts 0 - -  PHQ-9 Score 2 - -  Difficult doing work/chores Somewhat difficult - -  Some recent data might be hidden   GAD 7 : Generalized Anxiety Score 01/26/2021 11/10/2020 11/04/2020 12/17/2019  Nervous, Anxious, on Edge 0 1 2 1   Control/stop worrying 0 0 1 0  Worry too much - different things 0 0 0 0  Trouble relaxing 0 0 0 0  Restless 0 0 0 0  Easily annoyed or irritable  0 0 0 0  Afraid - awful might happen 0 0 0 1  Total GAD 7 Score 0 1 3 2   Anxiety Difficulty Not difficult at all Somewhat difficult Somewhat difficult -   Assessment/ Plan: [name] here for annual physical exam.   Annual physical exam  Hyponatremia - Plan: Basic metabolic panel, Amb ref to Medical Nutrition Therapy-MNT  Low TSH level - Plan: T4, Free, TSH  Moderate protein-calorie malnutrition ([place]) - Plan: Amb ref to Medical Nutrition Therapy-MNT  Recheck sodium level.  Noted to be slightly low on last laboratory checkup  Check free T4, TSH given low TSH noted previously  Referral made to nutrition for assistance.  Patient demonstrates malnutrition and is fairly restricted in what she is able to eat due to multiple allergies and intolerances.  Would like to see her back in 6 months for repeat weight  Anxiety disorder stable.  [place] narcotic database reviewed and there were no red flags.  Renewal sent.  Soriah Leeman M. [name], DO

## 2021-01-27 ENCOUNTER — Other Ambulatory Visit: Payer: Self-pay | Admitting: Family Medicine

## 2021-01-27 DIAGNOSIS — R7989 Other specified abnormal findings of blood chemistry: Secondary | ICD-10-CM

## 2021-01-27 DIAGNOSIS — E440 Moderate protein-calorie malnutrition: Secondary | ICD-10-CM

## 2021-01-27 LAB — BASIC METABOLIC PANEL
BUN/Creatinine Ratio: 14 (ref 12–28)
BUN: 10 mg/dL (ref 8–27)
CO2: 26 mmol/L (ref 20–29)
Calcium: 9.5 mg/dL (ref 8.7–10.3)
Chloride: 102 mmol/L (ref 96–106)
Creatinine, Ser: 0.72 mg/dL (ref 0.57–1.00)
Glucose: 91 mg/dL (ref 70–99)
Potassium: 4.5 mmol/L (ref 3.5–5.2)
Sodium: 138 mmol/L (ref 134–144)
eGFR: 90 mL/min/{1.73_m2} (ref >59)

## 2021-01-27 LAB — TSH: TSH: 0.44 u[IU]/mL — ABNORMAL LOW (ref 0.450–4.500)

## 2021-01-27 LAB — T4, FREE: Free T4: 1.3 ng/dL (ref 0.82–1.77)

## 2021-01-30 DIAGNOSIS — R300 Dysuria: Secondary | ICD-10-CM | POA: Diagnosis not present

## 2021-02-01 ENCOUNTER — Other Ambulatory Visit: Payer: Self-pay

## 2021-02-01 ENCOUNTER — Ambulatory Visit
Admission: RE | Admit: 2021-02-01 | Discharge: 2021-02-01 | Disposition: A | Discharge status: Home / Self Care | Payer: Medicare HMO | Source: Ambulatory Visit | Attending: Family Medicine | Admitting: Family Medicine

## 2021-02-01 DIAGNOSIS — Z1231 Encounter for screening mammogram for malignant neoplasm of breast: Secondary | ICD-10-CM

## 2021-02-03 DIAGNOSIS — R102 Pelvic and perineal pain: Secondary | ICD-10-CM | POA: Diagnosis not present

## 2021-02-03 DIAGNOSIS — R300 Dysuria: Secondary | ICD-10-CM | POA: Diagnosis not present

## 2021-02-03 DIAGNOSIS — N3010 Interstitial cystitis (chronic) without hematuria: Secondary | ICD-10-CM | POA: Diagnosis not present

## 2021-02-10 DIAGNOSIS — N3010 Interstitial cystitis (chronic) without hematuria: Secondary | ICD-10-CM | POA: Diagnosis not present

## 2021-02-11 DIAGNOSIS — M9903 Segmental and somatic dysfunction of lumbar region: Secondary | ICD-10-CM | POA: Diagnosis not present

## 2021-02-11 DIAGNOSIS — M5137 Other intervertebral disc degeneration, lumbosacral region: Secondary | ICD-10-CM | POA: Diagnosis not present

## 2021-02-11 DIAGNOSIS — M9901 Segmental and somatic dysfunction of cervical region: Secondary | ICD-10-CM | POA: Diagnosis not present

## 2021-02-11 DIAGNOSIS — M9902 Segmental and somatic dysfunction of thoracic region: Secondary | ICD-10-CM | POA: Diagnosis not present

## 2021-02-12 ENCOUNTER — Telehealth: Payer: Self-pay | Admitting: Family Medicine

## 2021-02-12 ENCOUNTER — Other Ambulatory Visit: Payer: Self-pay

## 2021-02-12 ENCOUNTER — Encounter: Payer: Self-pay | Admitting: Family Medicine

## 2021-02-12 ENCOUNTER — Ambulatory Visit (INDEPENDENT_AMBULATORY_CARE_PROVIDER_SITE_OTHER): Payer: Medicare HMO | Admitting: Family Medicine

## 2021-02-12 VITALS — BP 113/74 | HR 78 | Temp 97.5°F | Resp 20 | Ht 68.5 in | Wt 114.0 lb

## 2021-02-12 DIAGNOSIS — Z23 Encounter for immunization: Secondary | ICD-10-CM | POA: Diagnosis not present

## 2021-02-12 DIAGNOSIS — R252 Cramp and spasm: Secondary | ICD-10-CM

## 2021-02-12 NOTE — Patient Instructions (Signed)
Leg Cramps Leg cramps occur when one or more muscles tighten and a person has no control over it (involuntary muscle contraction). Muscle cramps are most common in the calf muscles of the leg. They can occur during exercise or at rest. Leg cramps are painful, and they may last for a few seconds to a few minutes. Cramps may return several times before they finallystop. Usually, leg cramps are not caused by a serious medical problem. In many cases, the cause is not known. Some common causes include: Excessive physical effort (overexertion), such as during intense exercise. Doing the same motion over and over. Staying in a certain position for a long period of time. Improper preparation, form, or technique while doing a sport or an activity. Dehydration. Injury. Side effects of certain medicines. Abnormally low levels of minerals in your blood (electrolytes), especially potassium and calcium. This could result from: Pregnancy. Taking diuretic medicines. Follow these instructions at home: Eating and drinking Drink enough fluid to keep your urine pale yellow. Staying hydrated may help prevent cramps. Eat a healthy diet that includes plenty of nutrients to help your muscles function. A healthy diet includes fruits and vegetables, lean protein, whole grains, and low-fat or nonfat dairy products. Managing pain, stiffness, and swelling     Try massaging, stretching, and relaxing the affected muscle. Do this for several minutes at a time. If directed, put ice on areas that are sore or painful after a cramp. To do this: Put ice in a plastic bag. Place a towel between your skin and the bag. Leave the ice on for 20 minutes, 2-3 times a day. Remove the ice if your skin turns bright red. This is very important. If you cannot feel pain, heat, or cold, you have a greater risk of damage to the area. If directed, apply heat to muscles that are tense or tight. Do this before you exercise, or as often as told  by your health care provider. Use the heat source that your health care provider recommends, such as a moist heat pack or a heating pad. To do this: Place a towel between your skin and the heat source. Leave the heat on for 20-30 minutes. Remove the heat if your skin turns bright red. This is especially important if you are unable to feel pain, heat, or cold. You may have a greater risk of getting burned. Try taking hot showers or baths to help relax tight muscles. General instructions If you are having frequent leg cramps, avoid intense exercise for several days. Take over-the-counter and prescription medicines only as told by your health care provider. Keep all follow-up visits. This is important. Contact a health care provider if: Your leg cramps get more severe or more frequent, or they do not improve over time. Your foot becomes cold, numb, or blue. Summary Muscle cramps can develop in any muscle, but the most common place is in the calf muscles of the leg. Leg cramps are painful, and they may last for a few seconds to a few minutes. Usually, leg cramps are not caused by a serious medical problem. Often, the cause is not known. Stay hydrated, and take over-the-counter and prescription medicines only as told by your health care provider. This information is not intended to replace advice given to you by your health care provider. Make sure you discuss any questions you have with your healthcare provider. Document Revised: 07/31/2019 Document Reviewed: 07/31/2019 Elsevier Patient Education  2022 [employer] Inc.  

## 2021-02-12 NOTE — Progress Notes (Signed)
Acute Office Visit  Subjective:    Patient ID: Kristy Garza, female    DOB: [birth year], 70 y.o.   MRN: [id number]  Chief Complaint  Patient presents with   Leg Pain    HPI Patient is in today for leg cramps at night. This has been intermittent for the last month. She has been taking mustard with some improvement. She has been staying well hydrated. She has also tried [place] homeopathic leg cramps and zanaflex 2 mg without improvement. She denies warmth, tenderness, swelling, numbness, or tingling. She recently had labs done and her electrolytes were normal.  Past Medical History:  Diagnosis Date   Acid reflux    Allergy    Arthritis    ARTHRITIS IN NECK BY DR. Clovis Riley [facility]   Asthma    Interstitial cystitis    Osteoporosis    PONV (postoperative nausea and vomiting)    Tinnitus    Trigeminal neuralgia    Atypical trigeminal neuralgia    Past Surgical History:  Procedure Laterality Date   ABDOMINAL HYSTERECTOMY  2000   CYSTOSCOPY WITH HYDRODISTENSION AND BIOPSY N/A 06/11/2012   Procedure: CYSTOSCOPY/BIOPSY/HYDRODISTENSION with Instillation of Pyridium and Marcaine and Kenalog;  Surgeon: Kathi Ludwig, MD;  Location: [facility];  Service: Urology;  Laterality: N/A;  1 hour requested for this case  BLADDER BIOPSY   ETHMOIDECTOMY  2012   SEPTOPLASTY  1980's   vocal cord surgery   1990's   polyp removal    Family History  Problem Relation Age of Onset   Hyperlipidemia Mother    Arthritis Mother    Cancer Mother    Lymphoma Mother    Cancer Father    Allergies Father    Allergies Sister    Allergies Sister    Allergic rhinitis Neg Hx    Angioedema Neg Hx    Asthma Neg Hx    Eczema Neg Hx    Immunodeficiency Neg Hx    Urticaria Neg Hx     Social History   Socioeconomic History   Marital status: Married    Spouse name: Lyda Jester   Number of children: 3   Years of education: 12   Highest education level: Some college, no degree   Occupational History   Occupation: CNA    Comment: Retired  Tobacco Use   Smoking status: Never   Smokeless tobacco: Never  Vaping Use   Vaping Use: Never used  Substance and Sexual Activity   Alcohol use: No    Alcohol/week: 0.0 standard drinks    Comment: 01-19-2016 per pt no   Drug use: No    Comment: 01-19-2016 per pt no    Sexual activity: Not Currently    Birth control/protection: Surgical  Other Topics Concern   Not on file  Social History Narrative   Lives with husband.    Social Determinants of Health   Financial Resource Strain: Not on file  Food Insecurity: Not on file  Transportation Needs: Not on file  Physical Activity: Not on file  Stress: Not on file  Social Connections: Not on file  Intimate Partner Violence: Not on file    Outpatient Medications Prior to Visit  Medication Sig Dispense Refill   acetaminophen (TYLENOL) 500 MG tablet Take by mouth.     albuterol (VENTOLIN HFA) 108 (90 Base) MCG/ACT inhaler USE 2 PUFFS EVERY 4 HOURS AS NEEDED FOR COUGH, WHEEZE, TIGHTNESS IN CHEST, OR SHORTNESS OF BREATH 18 each 1   aspirin-acetaminophen-caffeine (EXCEDRIN MIGRAINE)  250-250-65 MG tablet Take by mouth.     b complex vitamins capsule Take 1 capsule by mouth daily.     calcium citrate-vitamin D (CITRACAL+D) 315-200 MG-UNIT tablet Take by mouth.     Cholecalciferol 25 MCG (1000 UT) tablet Take by mouth.     cycloSPORINE (RESTASIS) 0.05 % ophthalmic emulsion PLACE ONE DROP INTO BOTH EYES TWICE A DAY     Dermatological Products, Misc. (SUVICORT) EMUL 1 application as needed     dextromethorphan-guaiFENesin (MUCINEX DM) 30-600 MG 12hr tablet Take 1 tablet by mouth 2 (two) times daily.     DHEA 25 MG CAPS Take by mouth.     Doxepin HCl 3 MG TABS Take 1 tablet (3 mg total) by mouth at bedtime as needed (sleep). 30 tablet 5   fexofenadine (ALLEGRA) 60 MG tablet Take by mouth.     fluconazole (DIFLUCAN) 100 MG tablet TAKE 1/2 TABLET BY MOUTH ON MONDAY, WEDNESDAY, AND  FRIDAY ONLY     Fluticasone Furoate (ARNUITY ELLIPTA) 200 MCG/ACT AEPB INHALE 1 PUFF BY MOUTH EVERY DAY 30 each 5   ipratropium (ATROVENT) 0.06 % nasal spray Place 2 sprays into both nostrils 3 (three) times daily as needed (nasal drainage). 15 mL 5   lidocaine (XYLOCAINE) 2 % jelly APPLY TOPICALLY TO AFFECTED AREA EVERY DAY AS NEEDED (use sparingly) 85 g 0   Lidocaine-Collagen-Aloe Vera (REGENECARE) 2 % GEL Apply topically.     LORazepam (ATIVAN) 0.5 MG tablet Take 1 tablet (0.5 mg total) by mouth 2 (two) times daily as needed for anxiety. 30 tablet 5   magnesium oxide (MAG-OX) 400 MG tablet Take by mouth.     melatonin 5 MG TABS Take 5 mg by mouth.     mometasone (NASONEX) 50 MCG/ACT nasal spray PLACE 2 SPRAYS INTO THE NOSE DAILY. 17 g 5   nystatin (MYCOSTATIN) 100000 UNIT/ML suspension Take 5 mLs (500,000 Units total) by mouth 4 (four) times daily. Swish and swallow 60 mL 0   pantoprazole (PROTONIX) 40 MG tablet Take 1 tablet (40 mg total) by mouth daily. 30 tablet 5   Plecanatide (TRULANCE) 3 MG TABS 1 tablet     Polyethylene Glycol 3350 (MIRALAX PO) Take by mouth as needed.     Probiotic Product (PROBIOTIC PO) Take by mouth daily.     Simethicone (GAS-X PO) Take by mouth.     tizanidine (ZANAFLEX) 2 MG capsule Take 1-2 capsules (2-4 mg total) by mouth 3 (three) times daily as needed for muscle spasms. 60 capsule 2   traMADol (ULTRAM) 50 MG tablet Take 1 tablet (50 mg total) by mouth every 12 (twelve) hours as needed. 30 tablet 1   triamcinolone cream (KENALOG) 0.1 % Apply 1 application topically 2 (two) times daily. 30 g 0   UNABLE TO FIND Take 1 capsule by mouth daily. Dv3- vitamin D 3 plus immune support     Facility-Administered Medications Prior to Visit  Medication Dose Route Frequency Provider Last Rate Last Admin   zoledronic acid (RECLAST) injection 5 mg  5 mg Intravenous Once Gottschalk, Ashly M, DO        Allergies  Allergen Reactions   Cefuroxime Axetil Other (See Comments)     Extreme gas   Gabapentin Other (See Comments)    Constipation Constipation   Montelukast Other (See Comments)    Numbness and tingling in hand. Numbness and tingling in hand. Numbness and tingling in hand. NUMBNESS OF HANDS AND FEET. Numbness in hands and feet NUMBNESS OF  HANDS AND FEET.   Pregabalin Other (See Comments)    Drowsiness, dry mouth Drowsiness, dry mouth Makes her tired and thirsty   Augmentin [Amoxicillin-Pot Clavulanate] Diarrhea   Avelox [Moxifloxacin Hcl In Nacl] Other (See Comments)    Tremors    Biaxin [Clarithromycin]     Unsure of reaction   Cedax [Ceftibuten] Other (See Comments)    tremors   Ciprofloxacin Other (See Comments) and Nausea And Vomiting    Blurred vision Blurred vision Pt can't remember reaction   Clindamycin/Lincomycin     tachycardia   Levofloxacin Other (See Comments)    Feels dehydrated   Lincomycin Other (See Comments)    tachycardia   Montelukast Sodium Other (See Comments)    Numbness and tingling in hand. Numbness and tingling in hand.   Sulfonamide Derivatives Other (See Comments)    Gi upset   Sulfa Antibiotics Nausea Only    Other reaction(s): Other (See Comments) Gi upset  Other reaction(s): Other (See Comments) Gi upset Other reaction(s): Unknown    Review of Systems As per HPI.     Objective:    Physical Exam Vitals and nursing note reviewed.  Constitutional:      General: She is not in acute distress.    Appearance: Normal appearance. She is not ill-appearing, toxic-appearing or diaphoretic.  Pulmonary:     Effort: Pulmonary effort is normal. No respiratory distress.  Musculoskeletal:        General: No swelling, tenderness, deformity or signs of injury.     Right lower leg: No edema.     Left lower leg: No edema.  Skin:    General: Skin is warm and dry.  Neurological:     General: No focal deficit present.     Mental Status: She is alert and oriented to person, place, and time.  Psychiatric:         Mood and Affect: Mood normal.        Behavior: Behavior normal.    BP 113/74   Pulse 78   Temp (!) 97.5 F (36.4 C) (Temporal)   Resp 20   Ht 5' 8.5" (1.74 m)   Wt 114 lb (51.7 kg)   SpO2 98%   BMI 17.08 kg/m  Wt Readings from Last 3 Encounters:  02/12/21 114 lb (51.7 kg)  01/26/21 110 lb 12.8 oz (50.3 kg)  01/18/21 109 lb (49.4 kg)    Health Maintenance Due  Topic Date Due   Zoster Vaccines- Shingrix (1 of 2) Never done   COVID-19 Vaccine (3 - Pfizer risk series) 03/04/2020    There are no preventive care reminders to display for this patient.   Lab Results  Component Value Date   TSH 0.440 (L) 01/26/2021   Lab Results  Component Value Date   WBC 3.8 07/08/2020   HGB 11.4 07/08/2020   HCT 34.1 07/08/2020   MCV 88 07/08/2020   PLT 214 07/08/2020   Lab Results  Component Value Date   NA 138 01/26/2021   K 4.5 01/26/2021   CO2 26 01/26/2021   GLUCOSE 91 01/26/2021   BUN 10 01/26/2021   CREATININE 0.72 01/26/2021   BILITOT 0.3 06/15/2020   ALKPHOS 79 06/15/2020   AST 25 06/15/2020   ALT 16 06/15/2020   PROT 7.1 06/15/2020   ALBUMIN 4.5 06/15/2020   CALCIUM 9.5 01/26/2021   EGFR 90 01/26/2021   GFR 93.08 03/25/2013   Lab Results  Component Value Date   CHOL 176 11/18/2019  Lab Results  Component Value Date   HDL 70 11/18/2019   Lab Results  Component Value Date   LDLCALC 96 11/18/2019   Lab Results  Component Value Date   TRIG 47 11/18/2019   Lab Results  Component Value Date   CHOLHDL 2.5 11/18/2019   Lab Results  Component Value Date   HGBA1C 5.1 06/15/2020       Assessment & Plan:   Reegan was seen today for leg pain.  Diagnoses and all orders for this visit:  Leg cramps Recently had normal BNP. Discussed stretching, exercise, hydration, and compression socks. Try zanaflex 4 mg.   Need for immunization against influenza -     Flu Vaccine QUAD High Dose(Fluad)  Return to office for new or worsening symptoms, or if  symptoms persist.   The patient indicates understanding of these issues and agrees with the plan.  Gwenlyn Perking, FNP

## 2021-02-17 DIAGNOSIS — Z20822 Contact with and (suspected) exposure to covid-19: Secondary | ICD-10-CM | POA: Diagnosis not present

## 2021-02-17 DIAGNOSIS — U071 COVID-19: Secondary | ICD-10-CM | POA: Diagnosis not present

## 2021-02-22 ENCOUNTER — Ambulatory Visit (INDEPENDENT_AMBULATORY_CARE_PROVIDER_SITE_OTHER): Payer: Medicare HMO | Admitting: Family

## 2021-02-22 ENCOUNTER — Encounter: Payer: Self-pay | Admitting: Family

## 2021-02-22 ENCOUNTER — Telehealth: Payer: Medicare HMO

## 2021-02-22 DIAGNOSIS — U071 COVID-19: Secondary | ICD-10-CM | POA: Diagnosis not present

## 2021-02-22 MED ORDER — BENZONATATE 200 MG PO CAPS
200.0000 mg | ORAL_CAPSULE | Freq: Three times a day (TID) | ORAL | 1 refills | Status: DC | PRN
Start: 2021-02-22 — End: 2021-04-12

## 2021-02-22 MED ORDER — DM-GUAIFENESIN ER 30-600 MG PO TB12: 1.0000 | ORAL_TABLET | Freq: Two times a day (BID) | ORAL | 1 refills | Status: DC

## 2021-02-22 NOTE — Progress Notes (Signed)
Virtual Visit  Note Due to COVID-19 pandemic this visit was conducted virtually. This visit type was conducted due to national recommendations for restrictions regarding the COVID-19 Pandemic (e.g. social distancing, sheltering in place) in an effort to limit this patient's exposure and mitigate transmission in our community. All issues noted in this document were discussed and addressed.  A physical exam was not performed with this format.  I connected with Kristy Garza on 02/22/21 at 9:14 Am  by telephone and verified that I am speaking with the correct person using two identifiers. Kristy Garza is currently located at home and no one  is currently with her during visit. The provider, Evelina Dun, FNP is located in their office at time of visit.  I discussed the limitations, risks, security and privacy concerns of performing an evaluation and management service by telephone and the availability of in person appointments. I also discussed with the patient that there may be a patient responsible charge related to this service. The patient expressed understanding and agreed to proceed.   Ms. Kristy Garza, Kristy Garza are scheduled for a virtual visit with your provider today.    Just as we do with appointments in the office, we must obtain your consent to participate.  Your consent will be active for this visit and any virtual visit you may have with one of our providers in the next 365 days.    If you have a MyChart account, I can also send a copy of this consent to you electronically.  All virtual visits are billed to your insurance company just like a traditional visit in the office.  As this is a virtual visit, video technology does not allow for your provider to perform a traditional examination.  This may limit your provider's ability to fully assess your condition.  If your provider identifies any concerns that need to be evaluated in person or the need to arrange testing such as labs, EKG, etc, we  will make arrangements to do so.    Although advances in technology are sophisticated, we cannot ensure that it will always work on either your end or our end.  If the connection with a video visit is poor, we may have to switch to a telephone visit.  With either a video or telephone visit, we are not always able to ensure that we have a secure connection.   I need to obtain your verbal consent now.   Are you willing to proceed with your visit today?   Kristy Garza has provided verbal consent on 02/22/2021 for a virtual visit (video or telephone).   Evelina Dun, [place] 02/22/2021  9:16 AM   History and Present Illness:  Pt calls the office today with positive COVID. She reports her symptoms started on 02/16/21 and tested positive on 02/17/21. She was given prednisone and zpak. She reports she continues to feel congestion and fatigue.  Cough This is a new problem. The current episode started 1 to 4 weeks ago. The problem has been waxing and waning. The problem occurs every few minutes. The cough is Non-productive. Associated symptoms include nasal congestion. Pertinent negatives include no chills, ear congestion, ear pain, fever, headaches (improved), sore throat, shortness of breath (tightness) or wheezing. She has tried rest, OTC cough suppressant and oral steroids for the symptoms. The treatment provided mild relief.     Review of Systems  Constitutional:  Negative for chills and fever.  HENT:  Negative for ear pain and sore throat.  Respiratory:  Positive for cough. Negative for shortness of breath (tightness) and wheezing.   Neurological:  Negative for headaches (improved).    Observations/Objective: No SOB or distress noted, intermittent dry cough  Assessment and Plan: 1. COVID-19 virus detected COVID positive, rest, force fluids, tylenol as needed, Quarantine for at least 5 days and you are fever free, then must wear a mask out in public from day 1-79, report any worsening  symptoms such as increased shortness of breath, swelling, or continued high fevers. Out of window for antivirals, tessalon rx given.  - dextromethorphan-guaiFENesin (MUCINEX DM) 30-600 MG 12hr tablet; Take 1 tablet by mouth 2 (two) times daily.  Dispense: 40 tablet; Refill: 1 - benzonatate (TESSALON) 200 MG capsule; Take 1 capsule (200 mg total) by mouth 3 (three) times daily as needed.  Dispense: 30 capsule; Refill: 1    I discussed the assessment and treatment plan with the patient. The patient was provided an opportunity to ask questions and all were answered. The patient agreed with the plan and demonstrated an understanding of the instructions.   The patient was advised to call back or seek an in-person evaluation if the symptoms worsen or if the condition fails to improve as anticipated.  The above assessment and management plan was discussed with the patient. The patient verbalized understanding of and has agreed to the management plan. Patient is aware to call the clinic if symptoms persist or worsen. Patient is aware when to return to the clinic for a follow-up visit. Patient educated on when it is appropriate to go to the emergency department.   Time call ended:  9:25 AM  I provided 11 minutes of  non face-to-face time during this encounter.    Evelina Dun, FNP

## 2021-02-23 ENCOUNTER — Telehealth: Payer: Self-pay

## 2021-02-23 NOTE — Telephone Encounter (Signed)
Dr. Kim please advise 

## 2021-02-23 NOTE — Telephone Encounter (Signed)
Pt informed of all these directions and will try this all and give [name] an update

## 2021-02-23 NOTE — Telephone Encounter (Signed)
Patient called stating she was diagnosed with COVID on last Wednesday. She was placed on Z-Pack & Prednisone at that time. She has finished up the Z-Pack and Prednisone. Her pcp sent in [place] Mucinex on yesterday, but she hasn't had a chance to pick it up yet.   She is having headaches, congestion in her head/chest, light cough and weakness.  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  She is wondering can she take Pseudoephedrine and Allegra.   Please advise  (PCP is in Epic)

## 2021-02-23 NOTE — Telephone Encounter (Signed)
Please call patient back  May use Mucinex twice a day - take with plenty of water to loosen up the phlegm. Yes, she can take Pseudoephedrine and Allegra together for the next few days. Only recommend taking the pseudoephedrine for a few days.  Okay to take allegra for longer.   During upper respiratory infections/asthma flares:  Increase Arnuity 189mcg to 1 puff twice a day for 1-2 weeks until your breathing symptoms return to baseline.  Pretreat with albuterol 2 puffs. May use albuterol rescue inhaler 2 puffs every 4 to 6 hours as needed for shortness of breath, chest tightness, coughing, and wheezing. Monitor frequency of use.   May use Nasonex nasal spray 1 spray per nostril twice a day as needed for nasal congestion.  May use ipratropium nasal 0.06% spray 1-2 sprays per nostril three times a day as needed for runny nose/drainage. Nasal saline spray (i.e., Simply Saline) or nasal saline lavage (i.e., NeilMed) is recommended as needed and prior to medicated nasal sprays.

## 2021-02-25 ENCOUNTER — Ambulatory Visit (INDEPENDENT_AMBULATORY_CARE_PROVIDER_SITE_OTHER): Payer: Medicare HMO | Admitting: Family Medicine

## 2021-02-25 ENCOUNTER — Ambulatory Visit: Payer: Medicare HMO | Admitting: Nurse Practitioner

## 2021-02-25 ENCOUNTER — Encounter: Payer: Self-pay | Admitting: Family Medicine

## 2021-02-25 VITALS — BP 110/73 | HR 94 | Temp 98.1°F | Ht 68.5 in | Wt 114.4 lb

## 2021-02-25 DIAGNOSIS — J40 Bronchitis, not specified as acute or chronic: Secondary | ICD-10-CM

## 2021-02-25 DIAGNOSIS — J329 Chronic sinusitis, unspecified: Secondary | ICD-10-CM

## 2021-02-25 MED ORDER — BETAMETHASONE SOD PHOS & ACET 6 (3-3) MG/ML IJ SUSP
6.0000 mg | Freq: Once | INTRAMUSCULAR | Status: AC
Start: 2021-02-25 — End: 2021-02-25
  Administered 2021-02-25: 6 mg via INTRAMUSCULAR

## 2021-02-25 MED ORDER — CLARITHROMYCIN 500 MG PO TABS: 500.0000 mg | ORAL_TABLET | Freq: Two times a day (BID) | ORAL | 0 refills | Status: DC

## 2021-02-25 NOTE — Telephone Encounter (Signed)
Spoke to patient and she stated she already has an appointment scheduled with her primary today at 2:50PM and he's a lot closer. She will call with updates.  Kristy Garza [phone]

## 2021-02-25 NOTE — Progress Notes (Addendum)
Subjective:  Patient ID: Kristy Garza, female    DOB: [birth year]  Age: 70 y.o. MRN: [id number]  CC: Cough, Nasal Congestion, and Headache    HPI Kristy Garza presents for seen last week for covid. Took prednisone and zpac.  Pressure in face and ears. Mild Dry cough, headache. Tongue burns. Some tightness in chest. No fever.   Depression screen [facility] 2/9 02/25/2021 02/12/2021 01/26/2021  Decreased Interest 1 0 0  Down, Depressed, Hopeless 1 0 0  PHQ - 2 Score 2 0 0  Altered sleeping 1 2 1   Tired, decreased energy 1 1 1   Change in appetite 0 0 0  Feeling bad or failure about yourself  0 0 0  Trouble concentrating 0 0 0  Moving slowly or fidgety/restless 0 0 0  Suicidal thoughts 0 0 0  PHQ-9 Score 4 3 2   Difficult doing work/chores Somewhat difficult Somewhat difficult Somewhat difficult  Some recent data might be hidden    History Kristy Garza has a past medical history of Acid reflux, Allergy, Arthritis, Asthma, Interstitial cystitis, Osteoporosis, PONV (postoperative nausea and vomiting), Tinnitus, and Trigeminal neuralgia.   She has a past surgical history that includes Abdominal hysterectomy (2000); vocal cord surgery  (3335'K); Ethmoidectomy (2012); Septoplasty (1980's); and Cystoscopy with hydrodistension and biopsy (N/A, 06/11/2012).   Her family history includes Allergies in her father, sister, and sister; Arthritis in her mother; Cancer in her father and mother; Hyperlipidemia in her mother; Lymphoma in her mother.She reports that she has never smoked. She has never used smokeless tobacco. She reports that she does not drink alcohol and does not use drugs.    ROS Review of Systems  Constitutional: Negative.   HENT: Negative.    Eyes:  Negative for visual disturbance.  Respiratory:  Negative for shortness of breath.   Cardiovascular:  Negative for chest pain.  Gastrointestinal:  Negative for abdominal pain.  Musculoskeletal:  Negative for arthralgias.   Objective:  BP  110/73   Pulse 94   Temp 98.1 F (36.7 C)   Ht 5' 8.5" (1.74 m)   Wt 114 lb 6.4 oz (51.9 kg)   SpO2 97%   BMI 17.14 kg/m   BP Readings from Last 3 Encounters:  02/25/21 110/73  02/12/21 113/74  01/26/21 97/65    Wt Readings from Last 3 Encounters:  02/25/21 114 lb 6.4 oz (51.9 kg)  02/12/21 114 lb (51.7 kg)  01/26/21 110 lb 12.8 oz (50.3 kg)     Physical Exam Constitutional:      Appearance: She is well-developed.  HENT:     Head: Normocephalic and atraumatic.     Right Ear: Tympanic membrane and external ear normal. No decreased hearing noted.     Left Ear: Tympanic membrane and external ear normal. No decreased hearing noted.     Nose: Mucosal edema present.     Right Sinus: No frontal sinus tenderness.     Left Sinus: No frontal sinus tenderness.     Mouth/Throat:     Pharynx: No oropharyngeal exudate or posterior oropharyngeal erythema.  Neck:     Meningeal: Brudzinski's sign absent.  Pulmonary:     Effort: No respiratory distress.     Breath sounds: Normal breath sounds.  Lymphadenopathy:     Head:     Right side of head: No preauricular adenopathy.     Left side of head: No preauricular adenopathy.     Cervical:     Right cervical: No superficial cervical adenopathy.  Left cervical: No superficial cervical adenopathy.      Assessment & Plan:   Niva was seen today for cough, nasal congestion and headache.  Diagnoses and all orders for this visit:  Sinobronchitis -     betamethasone acetate-betamethasone sodium phosphate (CELESTONE) injection 6 mg  Other orders -     clarithromycin (BIAXIN) 500 MG tablet; Take 1 tablet (500 mg total) by mouth 2 (two) times daily.      I am having [name]. Brett start on clarithromycin. I am also having her maintain her b complex vitamins, acetaminophen, Simethicone (GAS-X PO), Probiotic Product (PROBIOTIC PO), UNABLE TO FIND, Polyethylene Glycol 3350 (MIRALAX PO), lidocaine, tizanidine, nystatin,  ipratropium, Trulance, DHEA, magnesium oxide, fexofenadine, Suvicort, cycloSPORINE, Cholecalciferol, calcium citrate-vitamin D, aspirin-acetaminophen-caffeine, triamcinolone cream, traMADol, Doxepin HCl, melatonin, pantoprazole, mometasone, Arnuity Ellipta, albuterol, LORazepam, Regenecare, dextromethorphan-guaiFENesin, and benzonatate. We administered betamethasone acetate-betamethasone sodium phosphate. We will continue to administer zoledronic acid.  Allergies as of 02/25/2021       Reactions   Cefuroxime Axetil Other (See Comments)   Extreme gas   Gabapentin Other (See Comments)   Constipation Constipation   Montelukast Other (See Comments)   Numbness and tingling in hand. Numbness and tingling in hand. Numbness and tingling in hand. NUMBNESS OF HANDS AND FEET. Numbness in hands and feet NUMBNESS OF HANDS AND FEET.   Pregabalin Other (See Comments)   Drowsiness, dry mouth Drowsiness, dry mouth Makes her tired and thirsty   Augmentin [amoxicillin-pot Clavulanate] Diarrhea   Avelox [moxifloxacin Hcl In Nacl] Other (See Comments)   Tremors   Biaxin [clarithromycin]    Unsure of reaction   Cedax [ceftibuten] Other (See Comments)   tremors   Ciprofloxacin Other (See Comments), Nausea And Vomiting   Blurred vision Blurred vision Pt can't remember reaction   Clindamycin/lincomycin    tachycardia   Levofloxacin Other (See Comments)   Feels dehydrated   Lincomycin Other (See Comments)   tachycardia   Montelukast Sodium Other (See Comments)   Numbness and tingling in hand. Numbness and tingling in hand.   Sulfonamide Derivatives Other (See Comments)   Gi upset   Sulfa Antibiotics Nausea Only   Other reaction(s): Other (See Comments) Gi upset Other reaction(s): Other (See Comments) Gi upset Other reaction(s): Unknown        Medication List        Accurate as of February 25, 2021 11:59 PM. If you have any questions, ask your nurse or doctor.          acetaminophen  500 MG tablet Commonly known as: TYLENOL Take by mouth.   albuterol 108 (90 Base) MCG/ACT inhaler Commonly known as: VENTOLIN HFA USE 2 PUFFS EVERY 4 HOURS AS NEEDED FOR COUGH, WHEEZE, TIGHTNESS IN CHEST, OR SHORTNESS OF BREATH   Arnuity Ellipta 200 MCG/ACT Aepb Generic drug: Fluticasone Furoate INHALE 1 PUFF BY MOUTH EVERY DAY   aspirin-acetaminophen-caffeine 779-390-30 MG tablet Commonly known as: EXCEDRIN MIGRAINE Take by mouth.   b complex vitamins capsule Take 1 capsule by mouth daily.   benzonatate 200 MG capsule Commonly known as: TESSALON Take 1 capsule (200 mg total) by mouth 3 (three) times daily as needed.   calcium citrate-vitamin D 315-200 MG-UNIT tablet Commonly known as: CITRACAL+D Take by mouth.   Cholecalciferol 25 MCG (1000 UT) tablet Take by mouth.   clarithromycin 500 MG tablet Commonly known as: BIAXIN Take 1 tablet (500 mg total) by mouth 2 (two) times daily. Started by: [name], MD   cycloSPORINE 0.05 %  ophthalmic emulsion Commonly known as: RESTASIS PLACE ONE DROP INTO BOTH EYES TWICE A DAY   dextromethorphan-guaiFENesin 30-600 MG 12hr tablet Commonly known as: MUCINEX DM Take 1 tablet by mouth 2 (two) times daily.   DHEA 25 MG Caps Take by mouth.   Doxepin HCl 3 MG Tabs Take 1 tablet (3 mg total) by mouth at bedtime as needed (sleep).   fexofenadine 60 MG tablet Commonly known as: ALLEGRA Take by mouth.   GAS-X PO Take by mouth.   ipratropium 0.06 % nasal spray Commonly known as: ATROVENT Place 2 sprays into both nostrils 3 (three) times daily as needed (nasal drainage).   lidocaine 2 % jelly Commonly known as: XYLOCAINE APPLY TOPICALLY TO AFFECTED AREA EVERY DAY AS NEEDED (use sparingly)   LORazepam 0.5 MG tablet Commonly known as: ATIVAN Take 1 tablet (0.5 mg total) by mouth 2 (two) times daily as needed for anxiety.   magnesium oxide 400 MG tablet Commonly known as: MAG-OX Take by mouth.   melatonin 5 MG  Tabs Take 5 mg by mouth.   MIRALAX PO Take by mouth as needed.   mometasone 50 MCG/ACT nasal spray Commonly known as: NASONEX PLACE 2 SPRAYS INTO THE NOSE DAILY.   nystatin 100000 UNIT/ML suspension Commonly known as: MYCOSTATIN Take 5 mLs (500,000 Units total) by mouth 4 (four) times daily. Swish and swallow   pantoprazole 40 MG tablet Commonly known as: PROTONIX Take 1 tablet (40 mg total) by mouth daily.   PROBIOTIC PO Take by mouth daily.   Regenecare 2 % Gel Generic drug: Lidocaine-Collagen-Aloe Vera Apply topically.   Suvicort Emul 1 application as needed   tizanidine 2 MG capsule Commonly known as: ZANAFLEX Take 1-2 capsules (2-4 mg total) by mouth 3 (three) times daily as needed for muscle spasms.   traMADol 50 MG tablet Commonly known as: ULTRAM Take 1 tablet (50 mg total) by mouth every 12 (twelve) hours as needed.   triamcinolone cream 0.1 % Commonly known as: KENALOG Apply 1 application topically 2 (two) times daily.   Trulance 3 MG Tabs Generic drug: Plecanatide 1 tablet   UNABLE TO FIND Take 1 capsule by mouth daily. Dv3- vitamin D 3 plus immune support         Follow-up: No follow-ups on file.  [name], M.D.

## 2021-02-25 NOTE — Telephone Encounter (Signed)
Patient states she is still congested. Patient feels it in her head and chest. Patient has been doing everything Dr. Maudie Mercury recommended.  Patient is wondering if something else could be sent in for her (either antibiotics or more prednisone). Patient also asked if she could use afrin nose spray to get rid of the congestion.   Best contact number: [phone]

## 2021-02-25 NOTE — Telephone Encounter (Signed)
Please call patient and have her schedule an in office follow up. Kristy Garza has openings today in [place]. If she can't come in person then please schedule for televisit.  Thank you.

## 2021-02-25 NOTE — Telephone Encounter (Signed)
Noted  

## 2021-03-01 ENCOUNTER — Telehealth: Payer: Self-pay | Admitting: Family Medicine

## 2021-03-01 NOTE — Telephone Encounter (Signed)
Please call patient back.  She can use sudafed for a few days to help with the congestion - monitor blood pressure.  Recommend doing the nasal saline rinse first, then use the other nasal sprays - afrin okay to use for a few days for the nasal congestion.  She can continue her other nasal sprays as well as below as per the last visit.   May use Nasonex nasal spray 1 spray per nostril twice a day as needed for nasal congestion.  May use ipratropium nasal 0.06% spray 1-2 sprays per nostril three times a day as needed for runny nose/drainage.

## 2021-03-01 NOTE — Telephone Encounter (Signed)
Patient went to her primary and was told she has sinusitis & bronchitis. Patient was given a steroid shot.   Patient spoke to Dr. Neldon Mc over the weekend, Dr. Neldon Mc recommended patient use Afrin (one spray in one nostril in the morning, one spray in the other nostril in the evening) along with continuing to use her nasanex.   Patient is wondering if she can take sudafed and do her nasal rinses with the nose spray.  Best contact number: [phone]

## 2021-03-01 NOTE — Telephone Encounter (Signed)
Pt had an appt on 12/1 for cough and congestion. She has stopped taking her antibiotic because it tasted bitter and she stated that when she blew her nose, it was not colored. She would like to know what else she can do. Please call back.

## 2021-03-01 NOTE — Telephone Encounter (Signed)
Dr. Kim please advise 

## 2021-03-01 NOTE — Telephone Encounter (Signed)
Spoke with patient, informed her of Dr. Julianne Rice lab recommendation. Patient verbalized understanding.

## 2021-03-02 ENCOUNTER — Telehealth: Payer: Self-pay | Admitting: Family Medicine

## 2021-03-02 NOTE — Telephone Encounter (Signed)
Pt aware.

## 2021-03-02 NOTE — Telephone Encounter (Signed)
She was recently diagnosed with COVID and has been on both the Z-Pak and Biaxin since then.  Very unlikely to be ongoing bacterial infection.  This is likely manifestations of ongoing COVID-19 side effects/symptoms.  Recommend adequate hydration, humidification, sinus rinses.  If she would like to send over some Tessalon Perles for coughing I am glad to do so.  Could consider honey if she is looking for a naturopathic remedy

## 2021-03-05 ENCOUNTER — Encounter: Payer: Self-pay | Admitting: Family Medicine

## 2021-03-05 ENCOUNTER — Ambulatory Visit (INDEPENDENT_AMBULATORY_CARE_PROVIDER_SITE_OTHER): Payer: Medicare HMO | Admitting: Family Medicine

## 2021-03-05 VITALS — BP 117/87 | HR 81 | Temp 97.9°F | Ht 68.5 in | Wt 108.0 lb

## 2021-03-05 DIAGNOSIS — R053 Chronic cough: Secondary | ICD-10-CM | POA: Diagnosis not present

## 2021-03-05 DIAGNOSIS — R059 Cough, unspecified: Secondary | ICD-10-CM | POA: Diagnosis not present

## 2021-03-05 DIAGNOSIS — U099 Post covid-19 condition, unspecified: Secondary | ICD-10-CM

## 2021-03-05 DIAGNOSIS — R300 Dysuria: Secondary | ICD-10-CM

## 2021-03-05 LAB — URINALYSIS, ROUTINE W REFLEX MICROSCOPIC
Bilirubin, UA: NEGATIVE
Glucose, UA: NEGATIVE
Ketones, UA: NEGATIVE
Nitrite, UA: NEGATIVE
Protein,UA: NEGATIVE
RBC, UA: NEGATIVE
Specific Gravity, UA: 1.01 (ref 1.005–1.030)
Urobilinogen, Ur: 0.2 mg/dL (ref 0.2–1.0)
pH, UA: 7.5 (ref 5.0–7.5)

## 2021-03-05 LAB — MICROSCOPIC EXAMINATION
Bacteria, UA: NONE SEEN
Epithelial Cells (non renal): NONE SEEN /hpf (ref 0–10)
RBC, Urine: NONE SEEN /hpf (ref 0–2)
WBC, UA: NONE SEEN /hpf (ref 0–5)

## 2021-03-05 MED ORDER — METHYLPREDNISOLONE ACETATE 40 MG/ML IJ SUSP
40.0000 mg | Freq: Once | INTRAMUSCULAR | Status: AC
Start: 2021-03-05 — End: 2021-03-05
  Administered 2021-03-05: 40 mg via INTRAMUSCULAR

## 2021-03-05 MED ORDER — GUAIFENESIN ER 600 MG PO TB12: 600.0000 mg | ORAL_TABLET | Freq: Two times a day (BID) | ORAL | 0 refills | Status: AC

## 2021-03-05 NOTE — Progress Notes (Signed)
Subjective:  Patient ID: Kristy Garza, female    DOB: [birth year], 70 y.o.   MRN: [id number]  Patient Care Team: [name], DO as PCP - General (Family Medicine) Deneise Lever, MD as Consulting Physician (Pulmonary Disease) Minus Breeding, MD as Consulting Physician (Cardiology) Shea Evans [name], LCSW as Social Worker (Licensed Clinical Social Worker) Ilean China, RN as Case Manager   Chief Complaint:  Dysuria (Patient also experiencing persistent cough congestion headache)   HPI: Kristy Garza is a 70 y.o. female presenting on 03/05/2021 for Dysuria (Patient also experiencing persistent cough congestion headache)   Patient presents today for evaluation of dysuria.  States over the last couple of days she has noticed increased dysuria.  She has chronic cystitis with burning at times.  Denies any fever, chills, weakness, flank pain, or hematuria.  She is currently on cefdinir for sinusitis. She also reports continued cough post-COVID 2 weeks ago.  Cough is not productive.  No respiratory distress.  No chest pain or palpitations.  Dysuria  This is a new problem. The current episode started in the past 7 days. The problem occurs intermittently. The problem has been waxing and waning. The quality of the pain is described as burning. The pain is at a severity of 3/10. The pain is mild. There has been no fever. She is Not sexually active. There is No history of pyelonephritis. Pertinent negatives include no chills, discharge, flank pain, frequency, hematuria, hesitancy, nausea, possible pregnancy, sweats, urgency or vomiting. She has tried nothing for the symptoms.    Relevant past medical, surgical, family, and social history reviewed and updated as indicated.  Allergies and medications reviewed and updated. Data reviewed: Chart in Epic.   Past Medical History:  Diagnosis Date   Acid reflux    Allergy    Arthritis    ARTHRITIS IN NECK BY DR. Alroy Dust [facility]    Asthma    Interstitial cystitis    Osteoporosis    PONV (postoperative nausea and vomiting)    Tinnitus    Trigeminal neuralgia    Atypical trigeminal neuralgia    Past Surgical History:  Procedure Laterality Date   ABDOMINAL HYSTERECTOMY  2000   CYSTOSCOPY WITH HYDRODISTENSION AND BIOPSY N/A 06/11/2012   Procedure: CYSTOSCOPY/BIOPSY/HYDRODISTENSION with Instillation of Pyridium and Marcaine and Kenalog;  Surgeon: Ailene Rud, MD;  Location: [facility];  Service: Urology;  Laterality: N/A;  1 hour requested for this case  BLADDER BIOPSY   ETHMOIDECTOMY  2012   SEPTOPLASTY  1980's   vocal cord surgery   1990's   polyp removal    Social History   Socioeconomic History   Marital status: Married    Spouse name: [name]   Number of children: 3   Years of education: 12   Highest education level: Some college, no degree  Occupational History   Occupation: CNA    Comment: Retired  Tobacco Use   Smoking status: Never   Smokeless tobacco: Never  Vaping Use   Vaping Use: Never used  Substance and Sexual Activity   Alcohol use: No    Alcohol/week: 0.0 standard drinks    Comment: 01-19-2016 per pt no   Drug use: No    Comment: 01-19-2016 per pt no    Sexual activity: Not Currently    Birth control/protection: Surgical  Other Topics Concern   Not on file  Social History Narrative   Lives with husband.    Social Determinants of  Health   Financial Resource Strain: Not on file  Food Insecurity: Not on file  Transportation Needs: Not on file  Physical Activity: Not on file  Stress: Not on file  Social Connections: Not on file  Intimate Partner Violence: Not on file    Outpatient Encounter Medications as of 03/05/2021  Medication Sig   acetaminophen (TYLENOL) 500 MG tablet Take by mouth.   albuterol (VENTOLIN HFA) 108 (90 Base) MCG/ACT inhaler USE 2 PUFFS EVERY 4 HOURS AS NEEDED FOR COUGH, WHEEZE, TIGHTNESS IN CHEST, OR SHORTNESS OF BREATH    aspirin-acetaminophen-caffeine (EXCEDRIN MIGRAINE) 250-250-65 MG tablet Take by mouth.   b complex vitamins capsule Take 1 capsule by mouth daily.   benzonatate (TESSALON) 200 MG capsule Take 1 capsule (200 mg total) by mouth 3 (three) times daily as needed.   calcium citrate-vitamin D (CITRACAL+D) 315-200 MG-UNIT tablet Take by mouth.   Cholecalciferol 25 MCG (1000 UT) tablet Take by mouth.   cycloSPORINE (RESTASIS) 0.05 % ophthalmic emulsion PLACE ONE DROP INTO BOTH EYES TWICE A DAY   Dermatological Products, Misc. (SUVICORT) EMUL 1 application as needed   DHEA 25 MG CAPS Take by mouth.   Doxepin HCl 3 MG TABS Take 1 tablet (3 mg total) by mouth at bedtime as needed (sleep).   fexofenadine (ALLEGRA) 60 MG tablet Take by mouth.   Fluticasone Furoate (ARNUITY ELLIPTA) 200 MCG/ACT AEPB INHALE 1 PUFF BY MOUTH EVERY DAY   guaiFENesin (MUCINEX) 600 MG 12 hr tablet Take 1 tablet (600 mg total) by mouth 2 (two) times daily for 14 days.   ipratropium (ATROVENT) 0.06 % nasal spray Place 2 sprays into both nostrils 3 (three) times daily as needed (nasal drainage).   lidocaine (XYLOCAINE) 2 % jelly APPLY TOPICALLY TO AFFECTED AREA EVERY DAY AS NEEDED (use sparingly)   Lidocaine-Collagen-Aloe Vera (REGENECARE) 2 % GEL Apply topically.   LORazepam (ATIVAN) 0.5 MG tablet Take 1 tablet (0.5 mg total) by mouth 2 (two) times daily as needed for anxiety.   magnesium oxide (MAG-OX) 400 MG tablet Take by mouth.   melatonin 5 MG TABS Take 5 mg by mouth.   mometasone (NASONEX) 50 MCG/ACT nasal spray PLACE 2 SPRAYS INTO THE NOSE DAILY.   nystatin (MYCOSTATIN) 100000 UNIT/ML suspension Take 5 mLs (500,000 Units total) by mouth 4 (four) times daily. Swish and swallow   pantoprazole (PROTONIX) 40 MG tablet Take 1 tablet (40 mg total) by mouth daily.   Plecanatide (TRULANCE) 3 MG TABS 1 tablet   Polyethylene Glycol 3350 (MIRALAX PO) Take by mouth as needed.   Probiotic Product (PROBIOTIC PO) Take by mouth daily.    Simethicone (GAS-X PO) Take by mouth.   tizanidine (ZANAFLEX) 2 MG capsule Take 1-2 capsules (2-4 mg total) by mouth 3 (three) times daily as needed for muscle spasms.   traMADol (ULTRAM) 50 MG tablet Take 1 tablet (50 mg total) by mouth every 12 (twelve) hours as needed.   triamcinolone cream (KENALOG) 0.1 % Apply 1 application topically 2 (two) times daily.   UNABLE TO FIND Take 1 capsule by mouth daily. Dv3- vitamin D 3 plus immune support   [DISCONTINUED] dextromethorphan-guaiFENesin (MUCINEX DM) 30-600 MG 12hr tablet Take 1 tablet by mouth 2 (two) times daily.   [DISCONTINUED] clarithromycin (BIAXIN) 500 MG tablet Take 1 tablet (500 mg total) by mouth 2 (two) times daily.   Facility-Administered Encounter Medications as of 03/05/2021  Medication   zoledronic acid (RECLAST) injection 5 mg    Allergies  Allergen Reactions  Cefuroxime Axetil Other (See Comments)    Extreme gas   Gabapentin Other (See Comments)    Constipation Constipation   Montelukast Other (See Comments)    Numbness and tingling in hand. Numbness and tingling in hand. Numbness and tingling in hand. NUMBNESS OF HANDS AND FEET. Numbness in hands and feet NUMBNESS OF HANDS AND FEET.   Pregabalin Other (See Comments)    Drowsiness, dry mouth Drowsiness, dry mouth Makes her tired and thirsty   Augmentin [Amoxicillin-Pot Clavulanate] Diarrhea   Avelox [Moxifloxacin Hcl In Nacl] Other (See Comments)    Tremors    Biaxin [Clarithromycin]     Unsure of reaction   Cedax [Ceftibuten] Other (See Comments)    tremors   Ciprofloxacin Other (See Comments) and Nausea And Vomiting    Blurred vision Blurred vision Pt can't remember reaction   Clindamycin/Lincomycin     tachycardia   Levofloxacin Other (See Comments)    Feels dehydrated   Lincomycin Other (See Comments)    tachycardia   Montelukast Sodium Other (See Comments)    Numbness and tingling in hand. Numbness and tingling in hand.   Sulfonamide  Derivatives Other (See Comments)    Gi upset   Sulfa Antibiotics Nausea Only    Other reaction(s): Other (See Comments) Gi upset  Other reaction(s): Other (See Comments) Gi upset Other reaction(s): Unknown    Review of Systems  Constitutional:  Positive for activity change. Negative for appetite change, chills, diaphoresis, fatigue, fever and unexpected weight change.  Respiratory:  Positive for cough. Negative for apnea, choking, chest tightness, shortness of breath, wheezing and stridor.   Cardiovascular:  Negative for chest pain, palpitations and leg swelling.  Gastrointestinal:  Negative for abdominal pain, nausea and vomiting.  Genitourinary:  Positive for dysuria. Negative for decreased urine volume, difficulty urinating, enuresis, flank pain, frequency, genital sores, hematuria, hesitancy, pelvic pain, urgency, vaginal discharge and vaginal pain.  Neurological:  Negative for dizziness, tremors, seizures, syncope, facial asymmetry, speech difficulty, weakness, light-headedness, numbness and headaches.  Psychiatric/Behavioral:  Negative for confusion. The patient is nervous/anxious.   All other systems reviewed and are negative.      Objective:  BP 117/87   Pulse 81   Temp 97.9 F (36.6 C)   Ht 5' 8.5" (1.74 m)   Wt 108 lb (49 kg)   SpO2 98%   BMI 16.18 kg/m    Wt Readings from Last 3 Encounters:  03/05/21 108 lb (49 kg)  02/25/21 114 lb 6.4 oz (51.9 kg)  02/12/21 114 lb (51.7 kg)    Physical Exam Vitals and nursing note reviewed.  Constitutional:      General: She is not in acute distress.    Appearance: Normal appearance. She is normal weight. She is not ill-appearing, toxic-appearing or diaphoretic.  HENT:     Head: Normocephalic and atraumatic.     Right Ear: Tympanic membrane, ear canal and external ear normal.     Left Ear: Tympanic membrane, ear canal and external ear normal.     Nose: Nose normal.     Mouth/Throat:     Mouth: Mucous membranes are moist.      Pharynx: Oropharynx is clear. No oropharyngeal exudate or posterior oropharyngeal erythema.  Eyes:     Conjunctiva/sclera: Conjunctivae normal.     Pupils: Pupils are equal, round, and reactive to light.  Cardiovascular:     Rate and Rhythm: Normal rate and regular rhythm.     Heart sounds: Normal heart sounds. No murmur heard.  No friction rub. No gallop.  Pulmonary:     Effort: Pulmonary effort is normal. No respiratory distress.     Breath sounds: Normal breath sounds. No stridor. No wheezing, rhonchi or rales.  Chest:     Chest wall: No tenderness.  Abdominal:     General: Bowel sounds are normal.     Palpations: Abdomen is soft.     Tenderness: There is no right CVA tenderness or left CVA tenderness.  Musculoskeletal:     Right lower leg: No edema.     Left lower leg: No edema.  Skin:    General: Skin is warm and dry.     Capillary Refill: Capillary refill takes less than 2 seconds.  Neurological:     General: No focal deficit present.     Mental Status: She is alert and oriented to person, place, and time.  Psychiatric:        Mood and Affect: Mood normal.        Behavior: Behavior normal.        Thought Content: Thought content normal.        Judgment: Judgment normal.    Results for orders placed or performed in visit on 71/21/97  Basic metabolic panel  Result Value Ref Range   Glucose 91 70 - 99 mg/dL   BUN 10 8 - 27 mg/dL   Creatinine, Ser 0.72 0.57 - 1.00 mg/dL   eGFR 90 >59 mL/min/1.73   BUN/Creatinine Ratio 14 12 - 28   Sodium 138 134 - 144 mmol/L   Potassium 4.5 3.5 - 5.2 mmol/L   Chloride 102 96 - 106 mmol/L   CO2 26 20 - 29 mmol/L   Calcium 9.5 8.7 - 10.3 mg/dL  T4, Free  Result Value Ref Range   Free T4 1.30 0.82 - 1.77 ng/dL  TSH  Result Value Ref Range   TSH 0.440 (L) 0.450 - 4.500 uIU/mL       Pertinent labs & imaging results that were available during my care of the patient were reviewed by me and considered in my medical decision  making.  Assessment & Plan:  Baby was seen today for dysuria.  Diagnoses and all orders for this visit:  Dysuria Urinalysis only revealed 1+ leukocytes in office.  Culture pending, will treat if warranted.  Symptomatic care discussed in detail.  Increase water intake and avoid bladder irritants such as caffeine. -     Urinalysis, Routine w reflex microscopic -     Urine Culture  Post-COVID chronic cough Cough post-COVID.  Patient aware to continue Mucinex with lots of water.  No indications of pneumonia on exam.  Patient aware to report any new, worsening, or persistent symptoms.  Follow-up as needed. -     guaiFENesin (MUCINEX) 600 MG 12 hr tablet; Take 1 tablet (600 mg total) by mouth 2 (two) times daily for 14 days.     Continue all other maintenance medications.  Follow up plan: Return if symptoms worsen or fail to improve.   Continue healthy lifestyle choices, including diet (rich in fruits, vegetables, and lean proteins, and low in salt and simple carbohydrates) and exercise (at least 30 minutes of moderate physical activity daily).  Educational handout given for cough  The above assessment and management plan was discussed with the patient. The patient verbalized understanding of and has agreed to the management plan. Patient is aware to call the clinic if they develop any new symptoms or if symptoms persist or worsen. Patient  is aware when to return to the clinic for a follow-up visit. Patient educated on when it is appropriate to go to the emergency department.   Monia Pouch, FNP-C [facility] [phone]

## 2021-03-05 NOTE — Addendum Note (Signed)
Addended by: Geryl Rankins D on: 03/05/2021 12:06 PM   Modules accepted: Orders

## 2021-03-07 ENCOUNTER — Other Ambulatory Visit: Payer: Self-pay | Admitting: Allergy

## 2021-03-07 NOTE — Progress Notes (Signed)
Called by pt.  Had covid in Nov and states has been on steroids and antibiotics.   This past Friday she got depo 40mg  shot and advised to mucinex dm.  She was reading on the information about the medication and not using it in those who had asthma or emphysema.  So she is calling to seeif it is ok to use.   I advised we use mucinex often in those with asthma thus ok to use.  Advised take with full glass of water.   Since having the depo she states she feels jittery and anxious and asked if she could take ativan.  I I advised if that is what the ativan is intended for this likely ok but as I do not prescribe ativan she would need to talk to the prescriber for this confirmation.  She also asked if she could take more Afrin (states Dr. Neldon Mc has advised can use this).  I advised yes she could if having severe nasal congestion and would follow it with use of her Nasonex and only use Afrin for like 5 days at a time or less.

## 2021-03-08 ENCOUNTER — Ambulatory Visit: Payer: Medicare HMO | Admitting: Licensed Clinical Social Worker

## 2021-03-08 DIAGNOSIS — F411 Generalized anxiety disorder: Secondary | ICD-10-CM

## 2021-03-08 DIAGNOSIS — E782 Mixed hyperlipidemia: Secondary | ICD-10-CM

## 2021-03-08 DIAGNOSIS — R519 Headache, unspecified: Secondary | ICD-10-CM

## 2021-03-08 DIAGNOSIS — K219 Gastro-esophageal reflux disease without esophagitis: Secondary | ICD-10-CM

## 2021-03-08 DIAGNOSIS — E440 Moderate protein-calorie malnutrition: Secondary | ICD-10-CM

## 2021-03-08 NOTE — Chronic Care Management (AMB) (Signed)
Chronic Care Management    Clinical Social Work Note  03/08/2021 Name: Kristy Garza MRN: [id number] DOB: Mar 25, 1951  Kristy Garza is a 70 y.o. year old female who is a primary care patient of [name], DO. The CCM team was consulted to assist the patient with chronic disease management and/or care coordination needs related to: [employer] .   Engaged with patient by telephone for follow up visit in response to provider referral for social work chronic care management and care coordination services.   Consent to Services:  The patient was given information about Chronic Care Management services, agreed to services, and gave verbal consent prior to initiation of services.  Please see initial visit note for detailed documentation.   Patient agreed to services and consent obtained.   Assessment: Review of patient past medical history, allergies, medications, and health status, including review of relevant consultants reports was performed today as part of a comprehensive evaluation and provision of chronic care management and care coordination services.     SDOH (Social Determinants of Health) assessments and interventions performed:  SDOH Interventions    Flowsheet Row Most Recent Value  SDOH Interventions   Stress Interventions Provide Counseling  [client has stress related to her recovery from COVID-19. she has fatigue, low energy.]  Transportation Interventions Other (Comment)  [client has occasional tranport needs. LCSW gave client information and phone number for [facility]]  Depression Interventions/Treatment  --  [informed client of LCSW support and of RNCM support]        Advanced Directives Status: See [employer]yn[employer] application for related entries.  CCM Care Plan  Allergies  Allergen Reactions   Cefuroxime Axetil Other (See Comments)    Extreme gas   Gabapentin Other (See Comments)    Constipation Constipation   Montelukast Other (See Comments)    Numbness  and tingling in hand. Numbness and tingling in hand. Numbness and tingling in hand. NUMBNESS OF HANDS AND FEET. Numbness in hands and feet NUMBNESS OF HANDS AND FEET.   Pregabalin Other (See Comments)    Drowsiness, dry mouth Drowsiness, dry mouth Makes her tired and thirsty   Augmentin [Amoxicillin-Pot Clavulanate] Diarrhea   Avelox [Moxifloxacin Hcl In Nacl] Other (See Comments)    Tremors    Biaxin [Clarithromycin]     Unsure of reaction   Cedax [Ceftibuten] Other (See Comments)    tremors   Ciprofloxacin Other (See Comments) and Nausea And Vomiting    Blurred vision Blurred vision Pt can't remember reaction   Clindamycin/Lincomycin     tachycardia   Levofloxacin Other (See Comments)    Feels dehydrated   Lincomycin Other (See Comments)    tachycardia   Montelukast Sodium Other (See Comments)    Numbness and tingling in hand. Numbness and tingling in hand.   Sulfonamide Derivatives Other (See Comments)    Gi upset   Sulfa Antibiotics Nausea Only    Other reaction(s): Other (See Comments) Gi upset  Other reaction(s): Other (See Comments) Gi upset Other reaction(s): Unknown    Outpatient Encounter Medications as of 03/08/2021  Medication Sig Note   acetaminophen (TYLENOL) 500 MG tablet Take by mouth. 01/07/2016: Received from: [facility] Received Sig: Take by mouth.   albuterol (VENTOLIN HFA) 108 (90 Base) MCG/ACT inhaler USE 2 PUFFS EVERY 4 HOURS AS NEEDED FOR COUGH, WHEEZE, TIGHTNESS IN CHEST, OR SHORTNESS OF BREATH    aspirin-acetaminophen-caffeine (EXCEDRIN MIGRAINE) 250-250-65 MG tablet Take by mouth.    b complex vitamins  capsule Take 1 capsule by mouth daily.    benzonatate (TESSALON) 200 MG capsule Take 1 capsule (200 mg total) by mouth 3 (three) times daily as needed.    calcium citrate-vitamin D (CITRACAL+D) 315-200 MG-UNIT tablet Take by mouth.    Cholecalciferol 25 MCG (1000 UT) tablet Take by mouth.    cycloSPORINE (RESTASIS)  0.05 % ophthalmic emulsion PLACE ONE DROP INTO BOTH EYES TWICE A DAY    Dermatological Products, Misc. (SUVICORT) EMUL 1 application as needed    DHEA 25 MG CAPS Take by mouth.    Doxepin HCl 3 MG TABS Take 1 tablet (3 mg total) by mouth at bedtime as needed (sleep).    fexofenadine (ALLEGRA) 60 MG tablet Take by mouth.    Fluticasone Furoate (ARNUITY ELLIPTA) 200 MCG/ACT AEPB INHALE 1 PUFF BY MOUTH EVERY DAY    guaiFENesin (MUCINEX) 600 MG 12 hr tablet Take 1 tablet (600 mg total) by mouth 2 (two) times daily for 14 days.    ipratropium (ATROVENT) 0.06 % nasal spray Place 2 sprays into both nostrils 3 (three) times daily as needed (nasal drainage).    lidocaine (XYLOCAINE) 2 % jelly APPLY TOPICALLY TO AFFECTED AREA EVERY DAY AS NEEDED (use sparingly)    Lidocaine-Collagen-Aloe Vera (REGENECARE) 2 % GEL Apply topically.    LORazepam (ATIVAN) 0.5 MG tablet Take 1 tablet (0.5 mg total) by mouth 2 (two) times daily as needed for anxiety.    magnesium oxide (MAG-OX) 400 MG tablet Take by mouth.    melatonin 5 MG TABS Take 5 mg by mouth.    mometasone (NASONEX) 50 MCG/ACT nasal spray PLACE 2 SPRAYS INTO THE NOSE DAILY.    nystatin (MYCOSTATIN) 100000 UNIT/ML suspension Take 5 mLs (500,000 Units total) by mouth 4 (four) times daily. Swish and swallow    pantoprazole (PROTONIX) 40 MG tablet Take 1 tablet (40 mg total) by mouth daily.    Plecanatide (TRULANCE) 3 MG TABS 1 tablet    Polyethylene Glycol 3350 (MIRALAX PO) Take by mouth as needed.    Probiotic Product (PROBIOTIC PO) Take by mouth daily.    Simethicone (GAS-X PO) Take by mouth.    tizanidine (ZANAFLEX) 2 MG capsule Take 1-2 capsules (2-4 mg total) by mouth 3 (three) times daily as needed for muscle spasms.    traMADol (ULTRAM) 50 MG tablet Take 1 tablet (50 mg total) by mouth every 12 (twelve) hours as needed.    triamcinolone cream (KENALOG) 0.1 % Apply 1 application topically 2 (two) times daily.    UNABLE TO FIND Take 1 capsule by mouth  daily. Dv3- vitamin D 3 plus immune support    Facility-Administered Encounter Medications as of 03/08/2021  Medication   zoledronic acid (RECLAST) injection 5 mg    Patient Active Problem List   Diagnosis Date Noted   Upper respiratory tract infection 01/05/2021   Moderate persistent asthma without complication 97/98/9211   Raynaud phenomenon 06/16/2020   Tear film insufficiency 06/16/2020   Unspecified behavioral syndromes associated with physiological disturbances and physical factors 06/16/2020   Inflammatory arthritis 06/16/2020   Iron deficiency anemia, unspecified 04/17/2020   Age-related osteoporosis without current pathological fracture 01/04/2019   History of adenomatous polyp of colon 07/02/2018   Malnutrition of moderate degree ([place]) 05/23/2018   Adult BMI <19 kg/sq m 04/13/2018   Incontinence of feces 04/12/2018   Pharyngitis 01/09/2018   Anxiety state 01/19/2016   Adjustment disorder with anxious mood 01/19/2016   HLD (hyperlipidemia) 01/07/2016   IBS (irritable  bowel syndrome) 07/11/2015   Chronic constipation 06/22/2015   Chronic bronchitis ([place]) 05/07/2015   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   Trigeminal neuralgia 04/17/2014   Face pain 01/22/2014   Bruxism, sleep-related 08/28/2013   Asthma, chronic 05/13/2013   Generalized anxiety disorder 05/13/2013   Nonallergic rhinitis 05/13/2013   Difficulty speaking 04/11/2013   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   Leg cramps 03/23/2013   Gastroesophageal reflux disease 03/11/2013   Osteoporosis 01/30/2013   Abdominal pain 01/16/2013   Back ache 01/16/2013   Chronic interstitial cystitis 01/16/2013   FOM (frequency of micturition) 01/16/2013   Seasonal allergic rhinitis 07/07/2012   G E R D 08/15/2007   Cough 08/15/2007   VOCAL CORD POLYP, HX OF 08/15/2007    Conditions to be addressed/monitored: monitor client management of anxiety and depression symptoms  Care Plan : LCSW Care Plan  Updates made by  Katha Cabal, LCSW since 03/08/2021 12:00 AM     Problem: Emotional Distress      Goal: Emotional Health Supported;Manage depression issues; Manage anxiety issues   Start Date: 03/08/2021  Expected End Date: 05/31/2021  This Visit's Progress: On track  Recent Progress: On track  Priority: Medium  Note:   Current Barriers:  Chronic Mental Health needs related to depression and anxiety Challenges with recovery from COVID-19 Suicidal Ideation/Homicidal Ideation: No  Clinical Social Work Goal(s):  patient will work with SW monthly by telephone or in person to reduce or manage symptoms related to depression and anxiety patient will work with SW monthly to address concerns related to pain issues of client  Patient will communicate with RNCM in next 30 days to discuss nursing needs of client  Interventions: 1:1 collaboration with [name], DO regarding development and update of comprehensive plan of care as evidenced by provider attestation and co-signature Discussed client's upcoming medical appointments (she said she has appointment with a Gastroenterologist next Monday. She has appointment with allergy specialist  on 03/23/21. She also said she has an appointment with a medical provider next Tuesday to discuss thyroid needs.  Discussed transport needs of client. LCSW discussed [facility] services with client. LCSW gave client the phone number for [facility] Provided counseling support for client Discussed family support. She said she has support from her spouse. Reviewed ambulation of client. Danicka said she was walking well. Reviewed relaxation techniques. She likes to read, likes to listen to music, and likes to watch TV Discussed client recovery from COVID-19.  She said she has taken prescribed medications. She said she has a dry cough at present. She said she was fatigued, had low energy.  She was interested in [facility] calling her to discuss her current nursing needs. Collaborated  with RNCM, Chong Sicilian, regarding nursing needs of client Reviewed sleeping issues of client and reviewed pain issues of client  Encouraged client to communicate with RNCM regarding nursing needs of client  Patient Self Care Activities:  Self administers medications as prescribed Attends all scheduled provider appointments Performs ADL's independently  Patient Coping Strengths:  Family Friends  Patient Self Care Deficits:  Depression issues Anxiety issues  Patient Goals:  - spend time or talk with others at least 2 to 3 times per week - practice relaxation or meditation daily - keep a calendar with appointment dates  Follow Up Plan: LCSW to call client on 05/03/21 at 10:00 AM to assess client needs at that time.       [name].Trei Schoch MSW, LCSW Licensed Holiday representative [place]  Management [phone]

## 2021-03-08 NOTE — Patient Instructions (Addendum)
Visit Information  Patient Goals:  Protect My Health (Patient). Manage depression issues. Manage Anxiety issues  Timeframe:  Short-Term Goal Priority:  Medium Progress: On Track Start Date:          03/08/21                Expected End Date:        05/31/21            Follow Up Date   05/03/21 at 10:00 AM   Protect My Health (Patient)  Manage depression issues; Manage Anxiety issues    Why is this important?   Screening tests can find diseases early when they are easier to treat.  Your doctor or nurse will talk with you about which tests are important for you.  Getting shots for common diseases like the flu and shingles will help prevent them.     Patient Self Care Activities:  Self administers medications as prescribed Attends all scheduled provider appointments Performs ADL's independently  Patient Coping Strengths:  Family Friends  Patient Self Care Deficits:  Depression issues Anxiety issues  Patient Goals:  - spend time or talk with others at least 2 to 3 times per week - practice relaxation or meditation daily - keep a calendar with appointment dates  Follow Up Plan: LCSW to call client on 05/03/21 at 10:00 AM to assess client needs.    [name].Milina Pagett MSW, LCSW Licensed Clinical Social Worker [facility] Management [phone]

## 2021-03-09 ENCOUNTER — Telehealth: Payer: Self-pay | Admitting: Family Medicine

## 2021-03-09 LAB — URINE CULTURE

## 2021-03-09 NOTE — Telephone Encounter (Signed)
Contacted patient. Gave results. Patient verbalized understanding.  Patient states that her body does not agree with doxycycline.  She is asking for amoxicillin instead.  She says the doxycycline leaves her feeling numb and nervous.

## 2021-03-09 NOTE — Telephone Encounter (Signed)
Pt called to get her urine culture result. Would like for nurse or provider to call her back about it.

## 2021-03-09 NOTE — Telephone Encounter (Signed)
Advised patient that urine culture was negative.  Patient states she is still having urinary pain. She sees urology tomorrow. Advised patient to stop doxycycline

## 2021-03-10 ENCOUNTER — Telehealth: Payer: Medicare HMO

## 2021-03-10 DIAGNOSIS — N3010 Interstitial cystitis (chronic) without hematuria: Secondary | ICD-10-CM | POA: Diagnosis not present

## 2021-03-12 ENCOUNTER — Ambulatory Visit (INDEPENDENT_AMBULATORY_CARE_PROVIDER_SITE_OTHER): Payer: Medicare HMO | Admitting: *Deleted

## 2021-03-12 DIAGNOSIS — J4520 Mild intermittent asthma, uncomplicated: Secondary | ICD-10-CM

## 2021-03-12 DIAGNOSIS — F411 Generalized anxiety disorder: Secondary | ICD-10-CM

## 2021-03-12 DIAGNOSIS — U099 Post covid-19 condition, unspecified: Secondary | ICD-10-CM

## 2021-03-12 DIAGNOSIS — R053 Chronic cough: Secondary | ICD-10-CM

## 2021-03-12 NOTE — Chronic Care Management (AMB) (Signed)
Chronic Care Management   CCM RN Visit Note  03/12/2021 Name: Kristy Garza MRN: [id number] DOB: [birth year]  Subjective: Kristy Garza is a 70 y.o. year old female who is a primary care patient of [name], DO. The care management team was consulted for assistance with disease management and care coordination needs.    Engaged with patient by telephone for follow up visit in response to provider referral for case management and/or care coordination services.   Consent to Services:  The patient was given the following information about Chronic Care Management services today, agreed to services, and gave verbal consent: 1. CCM service includes personalized support from designated clinical staff supervised by the primary care provider, including individualized plan of care and coordination with other care providers 2. 24/7 contact phone numbers for assistance for urgent and routine care needs. 3. Service will only be billed when office clinical staff spend 20 minutes or more in a month to coordinate care. 4. Only one practitioner may furnish and bill the service in a calendar month. 5.The patient may stop CCM services at any time (effective at the end of the month) by phone call to the office staff. 6. The patient will be responsible for cost sharing (co-pay) of up to 20% of the service fee (after annual deductible is met). Patient agreed to services and consent obtained.  Patient agreed to services and verbal consent obtained.   Assessment: Review of patient past medical history, allergies, medications, health status, including review of consultants reports, laboratory and other test data, was performed as part of comprehensive evaluation and provision of chronic care management services.   SDOH (Social Determinants of Health) assessments and interventions performed:    CCM Care Plan  Allergies  Allergen Reactions   Cefuroxime Axetil Other (See Comments)    Extreme gas    Gabapentin Other (See Comments)    Constipation Constipation   Montelukast Other (See Comments)    Numbness and tingling in hand. Numbness and tingling in hand. Numbness and tingling in hand. NUMBNESS OF HANDS AND FEET. Numbness in hands and feet NUMBNESS OF HANDS AND FEET.   Pregabalin Other (See Comments)    Drowsiness, dry mouth Drowsiness, dry mouth Makes her tired and thirsty   Augmentin [Amoxicillin-Pot Clavulanate] Diarrhea   Avelox [Moxifloxacin Hcl In Nacl] Other (See Comments)    Tremors    Biaxin [Clarithromycin]     Unsure of reaction   Cedax [Ceftibuten] Other (See Comments)    tremors   Ciprofloxacin Other (See Comments) and Nausea And Vomiting    Blurred vision Blurred vision Pt can't remember reaction   Clindamycin/Lincomycin     tachycardia   Levofloxacin Other (See Comments)    Feels dehydrated   Lincomycin Other (See Comments)    tachycardia   Montelukast Sodium Other (See Comments)    Numbness and tingling in hand. Numbness and tingling in hand.   Sulfonamide Derivatives Other (See Comments)    Gi upset   Sulfa Antibiotics Nausea Only    Other reaction(s): Other (See Comments) Gi upset  Other reaction(s): Other (See Comments) Gi upset Other reaction(s): Unknown    Outpatient Encounter Medications as of 03/12/2021  Medication Sig Note   acetaminophen (TYLENOL) 500 MG tablet Take by mouth. 01/07/2016: Received from: [facility] Received Sig: Take by mouth.   albuterol (VENTOLIN HFA) 108 (90 Base) MCG/ACT inhaler USE 2 PUFFS EVERY 4 HOURS AS NEEDED FOR COUGH, WHEEZE, TIGHTNESS IN CHEST, OR SHORTNESS  OF BREATH    aspirin-acetaminophen-caffeine (EXCEDRIN MIGRAINE) 250-250-65 MG tablet Take by mouth.    b complex vitamins capsule Take 1 capsule by mouth daily.    benzonatate (TESSALON) 200 MG capsule Take 1 capsule (200 mg total) by mouth 3 (three) times daily as needed.    calcium citrate-vitamin D (CITRACAL+D) 315-200 MG-UNIT  tablet Take by mouth.    Cholecalciferol 25 MCG (1000 UT) tablet Take by mouth.    cycloSPORINE (RESTASIS) 0.05 % ophthalmic emulsion PLACE ONE DROP INTO BOTH EYES TWICE A DAY    Dermatological Products, Misc. (SUVICORT) EMUL 1 application as needed    DHEA 25 MG CAPS Take by mouth.    Doxepin HCl 3 MG TABS Take 1 tablet (3 mg total) by mouth at bedtime as needed (sleep).    fexofenadine (ALLEGRA) 60 MG tablet Take by mouth.    Fluticasone Furoate (ARNUITY ELLIPTA) 200 MCG/ACT AEPB INHALE 1 PUFF BY MOUTH EVERY DAY    guaiFENesin (MUCINEX) 600 MG 12 hr tablet Take 1 tablet (600 mg total) by mouth 2 (two) times daily for 14 days.    ipratropium (ATROVENT) 0.06 % nasal spray Place 2 sprays into both nostrils 3 (three) times daily as needed (nasal drainage).    lidocaine (XYLOCAINE) 2 % jelly APPLY TOPICALLY TO AFFECTED AREA EVERY DAY AS NEEDED (use sparingly)    Lidocaine-Collagen-Aloe Vera (REGENECARE) 2 % GEL Apply topically.    LORazepam (ATIVAN) 0.5 MG tablet Take 1 tablet (0.5 mg total) by mouth 2 (two) times daily as needed for anxiety.    magnesium oxide (MAG-OX) 400 MG tablet Take by mouth.    melatonin 5 MG TABS Take 5 mg by mouth.    mometasone (NASONEX) 50 MCG/ACT nasal spray PLACE 2 SPRAYS INTO THE NOSE DAILY.    nystatin (MYCOSTATIN) 100000 UNIT/ML suspension Take 5 mLs (500,000 Units total) by mouth 4 (four) times daily. Swish and swallow    pantoprazole (PROTONIX) 40 MG tablet Take 1 tablet (40 mg total) by mouth daily.    Plecanatide (TRULANCE) 3 MG TABS 1 tablet    Polyethylene Glycol 3350 (MIRALAX PO) Take by mouth as needed.    Probiotic Product (PROBIOTIC PO) Take by mouth daily.    Simethicone (GAS-X PO) Take by mouth.    tizanidine (ZANAFLEX) 2 MG capsule Take 1-2 capsules (2-4 mg total) by mouth 3 (three) times daily as needed for muscle spasms.    traMADol (ULTRAM) 50 MG tablet Take 1 tablet (50 mg total) by mouth every 12 (twelve) hours as needed.    triamcinolone cream  (KENALOG) 0.1 % Apply 1 application topically 2 (two) times daily.    UNABLE TO FIND Take 1 capsule by mouth daily. Dv3- vitamin D 3 plus immune support    Facility-Administered Encounter Medications as of 03/12/2021  Medication   zoledronic acid (RECLAST) injection 5 mg    Patient Active Problem List   Diagnosis Date Noted   Upper respiratory tract infection 01/05/2021   Moderate persistent asthma without complication 76/28/3151   Raynaud phenomenon 06/16/2020   Tear film insufficiency 06/16/2020   Unspecified behavioral syndromes associated with physiological disturbances and physical factors 06/16/2020   Inflammatory arthritis 06/16/2020   Iron deficiency anemia, unspecified 04/17/2020   Age-related osteoporosis without current pathological fracture 01/04/2019   History of adenomatous polyp of colon 07/02/2018   Malnutrition of moderate degree ([place]) 05/23/2018   Adult BMI <19 kg/sq m 04/13/2018   Incontinence of feces 04/12/2018   Pharyngitis 01/09/2018  Anxiety state 01/19/2016   Adjustment disorder with anxious mood 01/19/2016   HLD (hyperlipidemia) 01/07/2016   IBS (irritable bowel syndrome) 07/11/2015   Chronic constipation 06/22/2015   Chronic bronchitis (HCC) 05/07/2015   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   Trigeminal neuralgia 04/17/2014   Face pain 01/22/2014   Bruxism, sleep-related 08/28/2013   Asthma, chronic 05/13/2013   Generalized anxiety disorder 05/13/2013   Nonallergic rhinitis 05/13/2013   Difficulty speaking 04/11/2013   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   Leg cramps 03/23/2013   Gastroesophageal reflux disease 03/11/2013   Osteoporosis 01/30/2013   Abdominal pain 01/16/2013   Back ache 01/16/2013   Chronic interstitial cystitis 01/16/2013   FOM (frequency of micturition) 01/16/2013   Seasonal allergic rhinitis 07/07/2012   G E R D 08/15/2007   Cough 08/15/2007   VOCAL CORD POLYP, HX OF 08/15/2007    Conditions to be  addressed/monitored:Anxiety, Pulmonary Disease, and Osteoporosis  Care Plan : [place]  Updates made by Ilean China, RN since 03/12/2021 12:00 AM     Problem: Chronic Disease Management Needs (osteoporosis, asthma, anxiety)   Priority: High     Long-Range Goal: Work with [facility] Regarding Care Coordination and Care Management Associated with Chronic Disease Management Needs   Start Date: 09/02/2020  This Visit's Progress: On track  Recent Progress: On track  Priority: High  Note:   Current Barriers:  Chronic Disease Management support and education needs related to Asthma, anxiety with Excessive Worry, and osteoporosis  RNCM Clinical Goal(s):  Patient will verbalize understanding of plan for management of Anxiety and osteoporosis demonstrate ongoing health management independence     Keep all medical appointments Take medications as prescribed  Interventions: 1:1 collaboration with primary care provider regarding development and update of comprehensive plan of care as evidenced by provider attestation and co-signature Inter-disciplinary care team collaboration (see longitudinal plan of care) Evaluation of current treatment plan related to self management and patient's adherence to plan as established by provider General health and multiple body systems discussed in detail Patient encouraged to follow-up with PCP and specialists Reviewed medications and supplements in detail Reviewed upcoming appointments  Anemia:  (Status: Condition stable. Not addressed this visit.) Assessment of understanding of anemia/bleeding disorder diagnosis Basic overview and discussion of anemia/bleeding disorder or acute disease state Medications reviewed - Counseled on importance of regular laboratory monitoring as directed by provider; - recommended promotion of rest and energy-conserving measures to manage fatigue, such as balancing activity with periods of rest - encouraged strategies to  prevent falls related to fatigue, weakness and dizziness; encouraged sitting before standing and using an assistive device - encouraged optimal oral intake to support fluid balance and nutrition - encouraged dietary changes to increase dietary intake of iron, Vitamin W80 and folic acid as advised/prescribed - Assessed social determinant of health barriers;   Discussed iron supplement intake and constipation Encouraged to take with vitamin C for absorption Reviewed OTC medications to help with constipation Encouraged intake of whole foods containing fiber and supplement if necessary. Adequate water intake needed with insoluable fiber supplementation.  Extensively discussed diet Extensively discussed recent lab results that were available for review in [facility] and normal hgb, b12, and iron levels  Osteoporosis(Status: Condition stable. Not addressed this visit.)  Chart reviewed including relevant office notes, lab results, and imaging reports Discussed reclast infusion at [facility] for management of osteoporosis  Reviewed appt for next year for 2nd infusion Encouraged intake of calcium and vitamin D through diet and supplements  Fall precautions reviewed and encouraged patient to move carefully to avoid falls Advised dexa scan is due at least every 2 years Printed dexa scan and asked front office staff to mail to patient for her records  Anxiety(Status: Goal on track: YES.)  Encouraged to talk with LCSW regarding psychosocial concerns Therapeutic listening utilized regarding health anxiety associated with chronic medical conditions, current health state, recent specialty visits, and current symptoms with unknown causes   COVID:  (Status: New goal.) Short Term Goal  Provided education to patient to enhance basic understanding of COVID-19 as a viral disease, measures to prevent exposure, signs and symptoms, recommended vaccine schedule, when to contact provider  Discussed effects, symptoms, and  management of "long COVID"' Assessed social determinant of health barriers Reviewed and discussed medications and OTC treatments for residual symptoms Encouraged to reach out to PCP or seek appropriate medical attention for any new or worsening symptoms Therapeutic listening utilized regarding recent Covid infection and current residual symptoms Discussed need for rest but to increase activity level as tolerated so as not to become deconditioned   Patient Goals/Self-Care Activities: Patient will self administer medications as prescribed Patient will call pharmacy for medication refills Patient will continue to perform ADL's independently Patient will continue to perform IADL's independently Patient will call provider office for new concerns or questions Call RN Care Manager as needed [phone] Call LCSW with any psychosocial concerns Increase activity level as tolerated and rest as needed  Plan:Telephone follow up appointment with care management team member scheduled for:  04/14/21 with RNCM The patient has been provided with contact information for the care management team and has been advised to call with any health related questions or concerns.   Chong Sicilian, BSN, RN-BC Embedded Chronic Care Manager [facility] / [place] Management Direct Dial: [phone]

## 2021-03-12 NOTE — Patient Instructions (Signed)
Visit Information   Patient Goals/Self-Care Activities: Patient will self administer medications as prescribed Patient will call pharmacy for medication refills Patient will continue to perform ADL's independently Patient will continue to perform IADL's independently Patient will call provider office for new concerns or questions Call [place] as needed [phone] Call LCSW with any psychosocial concerns Increase activity level as tolerated and rest as needed  Patient verbalizes understanding of instructions provided today and agrees to view in [place].   Plan:Telephone follow up appointment with care management team member scheduled for:  04/14/21 with RNCM The patient has been provided with contact information for the care management team and has been advised to call with any health related questions or concerns.   Chong Sicilian, BSN, RN-BC Embedded Chronic Care Manager [facility] / [place] Management Direct Dial: [phone]

## 2021-03-15 ENCOUNTER — Other Ambulatory Visit: Payer: Self-pay

## 2021-03-15 ENCOUNTER — Encounter: Payer: Self-pay | Admitting: Allergy

## 2021-03-15 ENCOUNTER — Telehealth: Payer: Self-pay

## 2021-03-15 ENCOUNTER — Ambulatory Visit: Payer: Medicare HMO | Admitting: Allergy

## 2021-03-15 VITALS — BP 124/78 | HR 83 | Temp 98.3°F | Resp 16 | Ht 68.5 in | Wt 111.8 lb

## 2021-03-15 DIAGNOSIS — J4540 Moderate persistent asthma, uncomplicated: Secondary | ICD-10-CM

## 2021-03-15 DIAGNOSIS — J069 Acute upper respiratory infection, unspecified: Secondary | ICD-10-CM

## 2021-03-15 DIAGNOSIS — R058 Other specified cough: Secondary | ICD-10-CM | POA: Diagnosis not present

## 2021-03-15 DIAGNOSIS — B999 Unspecified infectious disease: Secondary | ICD-10-CM | POA: Diagnosis not present

## 2021-03-15 DIAGNOSIS — K219 Gastro-esophageal reflux disease without esophagitis: Secondary | ICD-10-CM

## 2021-03-15 DIAGNOSIS — J310 Chronic rhinitis: Secondary | ICD-10-CM

## 2021-03-15 MED ORDER — BUDESONIDE 0.5 MG/2ML IN SUSP: 0.5000 mg | Freq: Two times a day (BID) | RESPIRATORY_TRACT | 2 refills | Status: DC

## 2021-03-15 MED ORDER — BUDESONIDE-FORMOTEROL FUMARATE 80-4.5 MCG/ACT IN AERO: 2.0000 | INHALATION_SPRAY | Freq: Two times a day (BID) | RESPIRATORY_TRACT | 5 refills | Status: DC

## 2021-03-15 MED ORDER — ALBUTEROL SULFATE (2.5 MG/3ML) 0.083% IN NEBU: 2.5000 mg | INHALATION_SOLUTION | RESPIRATORY_TRACT | 1 refills | Status: DC | PRN

## 2021-03-15 NOTE — Assessment & Plan Note (Signed)
•   Keep track of infections and antibiotics use.  Get bloodwork to look at immune system.

## 2021-03-15 NOTE — Assessment & Plan Note (Signed)
Past history - 2022 bloodwork negative to environmental allergy panel.  Interim history - ENT evaluation unremarkable (patient declined laryngoscopy), still c/o nasal congestion.  Use Nasacort (triamcinolone) nasal spray 1 spray per nostril twice a day as needed for nasal congestion. Sample given.   Use azelastine nasal spray 1-2 sprays per nostril twice a day as needed for runny nose/drainage.  Nasal saline spray (i.e., Simply Saline) or nasal saline lavage (i.e., NeilMed) is recommended as needed and prior to medicated nasal sprays.  Use over the counter antihistamines such as Zyrtec (cetirizine), Claritin (loratadine), Allegra (fexofenadine), or Xyzal (levocetirizine) daily as needed. May switch antihistamines every few months.

## 2021-03-15 NOTE — Progress Notes (Signed)
Follow Up Note  RE: Kristy Garza MRN: [id number] DOB: 04-May-1950 Date of Office Visit: 03/15/2021  Referring provider: [name], DO Primary care provider: [name], DO  Chief Complaint: Follow-up (Covid day before thanksgiving, went to urgent care and started a 10 day antibiotic (did not finish). Dec 1 still not feeling good, went to PCP and got a steroid injection and antibiotics, Dec 9 went to PCP and received another steroid injection ([id number]). Patient still has congestion, scratchy throat, cough. PCP stated she had post covid symptoms but she is not sure.)  History of Present Illness: I had the pleasure of seeing Kristy Garza for a follow up visit at the [facility] on 03/15/2021. She is a 70 y.o. female, who is being followed for asthma, nonallergic rhinitis, GERD. Her previous allergy office visit was on 01/05/2021 with Dr. Maudie Mercury. Today is a new complaint visit of not feeling well .  Breathing/infections Patient had Covid-19 in November, 2022 and went to [facility]. She was treated  prednisone and antibiotics for the sinus congestion with minimal benefit.  She went to see her PCP and got a steroid injection x 2 with some benefit.  Currently her main complaint is dry coughing, headaches, head congestion.  She has been using sudafed and afrin on and off. She is able to sleep through the night though.  Taking Arnuity 186mcg 1 puff 1-2 times a day and using albuterol once a day with some benefit.   No nebulizer machine at home.   No recent CXR.  Denies any fevers/chills.   Patient was on Symbicort at one point and not sure why it was stopped. Breo caused thrush.     Nonallergic rhinitis Currently using Nasonex 1 spray BID and Astelin 2 sprays BID.    Gastroesophageal reflux disease Stable.    03/05/2021 ENT visit: "1) postinfectious cough  We discussed further work-up of this including chest x-ray and flexible fiberoptic laryngoscopy.  However, she wishes to defer these at this time. I did inform her that sometimes a postinfectious cough can take up to 12 weeks to resolve and she stated that her PCP told her it could be 6 months. I have advised her to continue her current medications We will be happy to see back here as needed or if she wishes to pursue additional testing."  Assessment and Plan: Kristy Garza is a 70 y.o. female with: Not well controlled moderate persistent asthma Past history - Breo caused thrush. 2022 spirometry showed: normal pattern with 7% improvement in FEV1 post bronchodilator treatment. Clinically feeling much better.  Interim history - since October patient had multiple courses of antibiotics and steroids. Covid-19 in November. Breathing is not back to baseline.  Get chest x-ray.  Nebulizer machine given and demonstrated proper use.  START Budesonide 0.5mg  nebulizer twice a day for the next 1-2 weeks. Pretreat with albuterol nebulizer. Daily controller medication(s): START Symbicort 26mcg 2 puffs twice a day with spacer and rinse mouth afterwards. STOP Arnuity for now. During upper respiratory infections/asthma flares:  Start budesonide 0.5mg  nebulizer twice a day for 1-2 weeks until your breathing symptoms return to baseline.  Pretreat with albuterol 2 puffs or albuterol nebulizer.  If you need to use your albuterol nebulizer machine back to back within 15-30 minutes with no relief then please go to the ER/urgent care for further evaluation.  May use albuterol rescue inhaler 2 puffs or nebulizer every 4 to 6 hours as needed for shortness of  breath, chest tightness, coughing, and wheezing. May use albuterol rescue inhaler 2 puffs 5 to 15 minutes prior to strenuous physical activities. Monitor frequency of use.  Get spirometry at next visit.  Recurrent infections Keep track of infections and antibiotics use. Get bloodwork to look at immune system.  Nonallergic rhinitis Past history - 2022 bloodwork  negative to environmental allergy panel.  Interim history - ENT evaluation unremarkable (patient declined laryngoscopy), still c/o nasal congestion. Use Nasacort (triamcinolone) nasal spray 1 spray per nostril twice a day as needed for nasal congestion. Sample given.  Use azelastine nasal spray 1-2 sprays per nostril twice a day as needed for runny nose/drainage. Nasal saline spray (i.e., Simply Saline) or nasal saline lavage (i.e., NeilMed) is recommended as needed and prior to medicated nasal sprays. Use over the counter antihistamines such as Zyrtec (cetirizine), Claritin (loratadine), Allegra (fexofenadine), or Xyzal (levocetirizine) daily as needed. May switch antihistamines every few months.  Gastroesophageal reflux disease Continue dietary and lifestyle modifications as listed below Continue pantoprazole 40mg  daily. Nothing to eat or drink 30 minutes afterwards.  Return in about 2 months (around 05/16/2021).  Meds ordered this encounter  Medications   budesonide (PULMICORT) 0.5 MG/2ML nebulizer solution    Sig: Take 2 mLs (0.5 mg total) by nebulization in the morning and at bedtime.    Dispense:  120 mL    Refill:  2   albuterol (PROVENTIL) (2.5 MG/3ML) 0.083% nebulizer solution    Sig: Take 3 mLs (2.5 mg total) by nebulization every 4 (four) hours as needed for wheezing or shortness of breath (coughing fits).    Dispense:  75 mL    Refill:  1   budesonide-formoterol (SYMBICORT) 80-4.5 MCG/ACT inhaler    Sig: Inhale 2 puffs into the lungs in the morning and at bedtime. with spacer and rinse mouth afterwards.    Dispense:  1 each    Refill:  5   Lab Orders         CBC with Differential/Platelet         IgG, IgA, IgM         Strep pneumoniae 23 Serotypes IgG         Complement, total         Diphtheria / Tetanus Antibody Panel      Diagnostics: None.   Medication List:  Current Outpatient Medications  Medication Sig Dispense Refill   acetaminophen (TYLENOL) 500 MG tablet  Take by mouth.     albuterol (PROVENTIL) (2.5 MG/3ML) 0.083% nebulizer solution Take 3 mLs (2.5 mg total) by nebulization every 4 (four) hours as needed for wheezing or shortness of breath (coughing fits). 75 mL 1   albuterol (VENTOLIN HFA) 108 (90 Base) MCG/ACT inhaler USE 2 PUFFS EVERY 4 HOURS AS NEEDED FOR COUGH, WHEEZE, TIGHTNESS IN CHEST, OR SHORTNESS OF BREATH 18 each 1   aspirin-acetaminophen-caffeine (EXCEDRIN MIGRAINE) 250-250-65 MG tablet Take by mouth.     b complex vitamins capsule Take 1 capsule by mouth daily.     benzonatate (TESSALON) 200 MG capsule Take 1 capsule (200 mg total) by mouth 3 (three) times daily as needed. 30 capsule 1   budesonide (PULMICORT) 0.5 MG/2ML nebulizer solution Take 2 mLs (0.5 mg total) by nebulization in the morning and at bedtime. 120 mL 2   budesonide-formoterol (SYMBICORT) 80-4.5 MCG/ACT inhaler Inhale 2 puffs into the lungs in the morning and at bedtime. with spacer and rinse mouth afterwards. 1 each 5   cycloSPORINE (RESTASIS) 0.05 % ophthalmic emulsion  PLACE ONE DROP INTO BOTH EYES TWICE A DAY     DHEA 25 MG CAPS Take by mouth.     fexofenadine (ALLEGRA) 60 MG tablet Take by mouth.     guaiFENesin (MUCINEX) 600 MG 12 hr tablet Take 1 tablet (600 mg total) by mouth 2 (two) times daily for 14 days. 28 tablet 0   lidocaine (XYLOCAINE) 2 % jelly APPLY TOPICALLY TO AFFECTED AREA EVERY DAY AS NEEDED (use sparingly) 85 g 0   Lidocaine-Collagen-Aloe Vera (REGENECARE) 2 % GEL Apply topically.     LORazepam (ATIVAN) 0.5 MG tablet Take 1 tablet (0.5 mg total) by mouth 2 (two) times daily as needed for anxiety. 30 tablet 5   mometasone (NASONEX) 50 MCG/ACT nasal spray PLACE 2 SPRAYS INTO THE NOSE DAILY. 17 g 5   nystatin (MYCOSTATIN) 100000 UNIT/ML suspension Take 5 mLs (500,000 Units total) by mouth 4 (four) times daily. Swish and swallow 60 mL 0   pantoprazole (PROTONIX) 40 MG tablet Take 1 tablet (40 mg total) by mouth daily. 30 tablet 5   Polyethylene Glycol  3350 (MIRALAX PO) Take by mouth as needed.     Probiotic Product (PROBIOTIC PO) Take by mouth daily.     Simethicone (GAS-X PO) Take by mouth.     tizanidine (ZANAFLEX) 2 MG capsule Take 1-2 capsules (2-4 mg total) by mouth 3 (three) times daily as needed for muscle spasms. 60 capsule 2   traMADol (ULTRAM) 50 MG tablet Take 1 tablet (50 mg total) by mouth every 12 (twelve) hours as needed. 30 tablet 1   UNABLE TO FIND Take 1 capsule by mouth daily. Dv3- vitamin D 3 plus immune support     calcium citrate-vitamin D (CITRACAL+D) 315-200 MG-UNIT tablet Take by mouth. (Patient not taking: Reported on 03/15/2021)     Cholecalciferol 25 MCG (1000 UT) tablet Take by mouth. (Patient not taking: Reported on 03/15/2021)     Dermatological Products, Misc. ([place]) EMUL 1 application as needed (Patient not taking: Reported on 03/15/2021)     Doxepin HCl 3 MG TABS Take 1 tablet (3 mg total) by mouth at bedtime as needed (sleep). (Patient not taking: Reported on 03/15/2021) 30 tablet 5   magnesium oxide (MAG-OX) 400 MG tablet Take by mouth. (Patient not taking: Reported on 03/15/2021)     melatonin 5 MG TABS Take 5 mg by mouth. (Patient not taking: Reported on 03/15/2021)     Plecanatide (TRULANCE) 3 MG TABS 1 tablet (Patient not taking: Reported on 03/15/2021)     Current Facility-Administered Medications  Medication Dose Route Frequency Provider Last Rate Last Admin   zoledronic acid (RECLAST) injection 5 mg  5 mg Intravenous Once Gottschalk, Ashly M, DO       Allergies: Allergies  Allergen Reactions   Cefuroxime Axetil Other (See Comments)    Extreme gas   Gabapentin Other (See Comments)    Constipation Constipation   Montelukast Other (See Comments)    Numbness and tingling in hand. Numbness and tingling in hand. Numbness and tingling in hand. NUMBNESS OF HANDS AND FEET. Numbness in hands and feet NUMBNESS OF HANDS AND FEET.   Pregabalin Other (See Comments)    Drowsiness, dry  mouth Drowsiness, dry mouth Makes her tired and thirsty   Augmentin [Amoxicillin-Pot Clavulanate] Diarrhea   Avelox [Moxifloxacin Hcl In Nacl] Other (See Comments)    Tremors    Biaxin [Clarithromycin]     Unsure of reaction   Cedax [Ceftibuten] Other (See Comments)  tremors   Ciprofloxacin Other (See Comments) and Nausea And Vomiting    Blurred vision Blurred vision Pt can't remember reaction   Clindamycin/Lincomycin     tachycardia   Levofloxacin Other (See Comments)    Feels dehydrated   Lincomycin Other (See Comments)    tachycardia   Montelukast Sodium Other (See Comments)    Numbness and tingling in hand. Numbness and tingling in hand.   Sulfonamide Derivatives Other (See Comments)    Gi upset   Sulfa Antibiotics Nausea Only    Other reaction(s): Other (See Comments) Gi upset  Other reaction(s): Other (See Comments) Gi upset Other reaction(s): Unknown   I reviewed her past medical history, social history, family history, and environmental history and no significant changes have been reported from her previous visit.  Review of Systems  Constitutional:  Negative for appetite change, chills, fever and unexpected weight change.  HENT:  Positive for postnasal drip and rhinorrhea.   Eyes:  Negative for itching.  Respiratory:  Positive for cough. Negative for chest tightness, shortness of breath and wheezing.   Gastrointestinal:  Negative for abdominal pain.  Skin:  Negative for rash.  Allergic/Immunologic: Negative for environmental allergies.  Neurological:  Positive for headaches.   Objective: BP 124/78 (BP Location: Right Arm, Patient Position: Sitting)    Pulse 83    Temp 98.3 F (36.8 C) (Temporal)    Resp 16    Ht 5' 8.5" (1.74 m)    Wt 111 lb 12.8 oz (50.7 kg)    SpO2 99%    BMI 16.75 kg/m  Body mass index is 16.75 kg/m. Physical Exam Vitals and nursing note reviewed.  Constitutional:      Appearance: Normal appearance. She is well-developed.  HENT:      Head: Normocephalic and atraumatic.     Right Ear: External ear normal.     Left Ear: External ear normal.     Nose: Nose normal.     Mouth/Throat:     Mouth: Mucous membranes are moist.     Pharynx: Oropharynx is clear.  Eyes:     Conjunctiva/sclera: Conjunctivae normal.  Cardiovascular:     Rate and Rhythm: Normal rate and regular rhythm.     Heart sounds: Normal heart sounds. No murmur heard. Pulmonary:     Effort: Pulmonary effort is normal.     Breath sounds: Normal breath sounds. No wheezing, rhonchi or rales.  Musculoskeletal:     Cervical back: Neck supple.  Skin:    General: Skin is warm.     Findings: No rash.  Neurological:     Mental Status: She is alert and oriented to person, place, and time.  Psychiatric:        Behavior: Behavior normal.   Previous notes and tests were reviewed. The plan was reviewed with the patient/family, and all questions/concerned were addressed.  It was my pleasure to see Kristy Garza today and participate in her care. Please feel free to contact me with any questions or concerns.  Sincerely,  Rexene Alberts, DO Allergy & Immunology  [facility] office: [place] office: [phone]

## 2021-03-15 NOTE — Telephone Encounter (Signed)
A copy of the chest x ray requisition has been faxed over to [facility]. Per patient Request.   Phone: [phone]  Fax: [phone]

## 2021-03-15 NOTE — Assessment & Plan Note (Signed)
Past history - Breo caused thrush. 2022 spirometry showed: normal pattern with 7% improvement in FEV1 post bronchodilator treatment. Clinically feeling much better.  Interim history - since October patient had multiple courses of antibiotics and steroids. Covid-19 in November. Breathing is not back to baseline.   Get chest x-ray.   Nebulizer machine given and demonstrated proper use.   START Budesonide 0.5mg  nebulizer twice a day for the next 1-2 weeks.  Pretreat with albuterol nebulizer.  Daily controller medication(s): START Symbicort 47mcg 2 puffs twice a day with spacer and rinse mouth afterwards. o STOP Arnuity for now.  During upper respiratory infections/asthma flares:  o Start budesonide 0.5mg  nebulizer twice a day for 1-2 weeks until your breathing symptoms return to baseline.  o Pretreat with albuterol 2 puffs or albuterol nebulizer.  o If you need to use your albuterol nebulizer machine back to back within 15-30 minutes with no relief then please go to the ER/urgent care for further evaluation.   May use albuterol rescue inhaler 2 puffs or nebulizer every 4 to 6 hours as needed for shortness of breath, chest tightness, coughing, and wheezing. May use albuterol rescue inhaler 2 puffs 5 to 15 minutes prior to strenuous physical activities. Monitor frequency of use.   Get spirometry at next visit.

## 2021-03-15 NOTE — Patient Instructions (Addendum)
Upper respiratory infection: May use Mucinex twice a day with plenty of water.  Keep track of infections and antibiotics use. Get bloodwork to look at immune system. We are ordering labs, so please allow 1-2 weeks for the results to come back. With the newly implemented Cures Act, the labs might be visible to you at the same time that they become visible to me. However, I will not address the results until all of the results are back, so please be patient.  In the meantime, continue recommendations in your patient instructions, including avoidance measures (if applicable), until you hear from me. Get chest X-ray.   Rhinitis Use Nasacort (triamcinolone) nasal spray 1 spray per nostril twice a day as needed for nasal congestion. Sample given.  Use azelastine nasal spray 1-2 sprays per nostril twice a day as needed for runny nose/drainage. Nasal saline spray (i.e., Simply Saline) or nasal saline lavage (i.e., NeilMed) is recommended as needed and prior to medicated nasal sprays. Use over the counter antihistamines such as Zyrtec (cetirizine), Claritin (loratadine), Allegra (fexofenadine), or Xyzal (levocetirizine) daily as needed. May switch antihistamines every few months.  Asthma Nebulizer machine given.  START Budesonide 0.5mg  nebulizer twice a day for the next 1-2 weeks. Pretreat with albuterol nebulizer.  Daily controller medication(s): START Symbicort 32mcg 2 puffs twice a day with spacer and rinse mouth afterwards. STOP Arnuity for now. During upper respiratory infections/asthma flares:  Start budesonide 0.5mg  nebulizer twice a day for 1-2 weeks until your breathing symptoms return to baseline.  Pretreat with albuterol 2 puffs or albuterol nebulizer.  If you need to use your albuterol nebulizer machine back to back within 15-30 minutes with no relief then please go to the ER/urgent care for further evaluation.  May use albuterol rescue inhaler 2 puffs or nebulizer every 4 to 6 hours as  needed for shortness of breath, chest tightness, coughing, and wheezing. May use albuterol rescue inhaler 2 puffs 5 to 15 minutes prior to strenuous physical activities. Monitor frequency of use.  Asthma control goals:  Full participation in all desired activities (may need albuterol before activity) Albuterol use two times or less a week on average (not counting use with activity) Cough interfering with sleep two times or less a month Oral steroids no more than once a year No hospitalizations   Reflux Continue dietary and lifestyle modifications as listed below Continue pantoprazole 40mg  daily. Nothing to eat or drink 30 minutes afterwards.  Follow up in 2 months or sooner if needed.   Drink plenty of fluids. Water, juice, clear broth or warm lemon water are good choices. Avoid caffeine and alcohol, which can dehydrate you. Eat chicken soup. Chicken soup and other warm fluids can be soothing and loosen congestion. Rest. Adjust your room's temperature and humidity. Keep your room warm but not overheated. If the air is dry, a cool-mist humidifier or vaporizer can moisten the air and help ease congestion and coughing. Keep the humidifier clean to prevent the growth of bacteria and molds. Soothe your throat. Perform a saltwater gargle. Dissolve one-quarter to a half teaspoon of salt in a 4- to 8-ounce glass of warm water. This can relieve a sore or scratchy throat temporarily. Use saline nasal drops. To help relieve nasal congestion, try saline nasal drops. You can buy these drops over the counter, and they can help relieve symptoms ? even in children. Take over-the-counter cold and cough medications. For adults and children older than 5, over-the-counter decongestants, antihistamines and pain relievers might offer  some symptom relief. However, they won't prevent a cold or shorten its duration.

## 2021-03-15 NOTE — Assessment & Plan Note (Signed)
·   Continue dietary and lifestyle modifications as listed below  Continue pantoprazole 40mg  daily. Nothing to eat or drink 30 minutes afterwards.

## 2021-03-16 ENCOUNTER — Ambulatory Visit (INDEPENDENT_AMBULATORY_CARE_PROVIDER_SITE_OTHER): Payer: Medicare HMO

## 2021-03-16 ENCOUNTER — Telehealth: Payer: Self-pay | Admitting: Allergy

## 2021-03-16 ENCOUNTER — Other Ambulatory Visit: Payer: Self-pay

## 2021-03-16 ENCOUNTER — Ambulatory Visit: Payer: Medicare HMO | Admitting: Nurse Practitioner

## 2021-03-16 DIAGNOSIS — R051 Acute cough: Secondary | ICD-10-CM

## 2021-03-16 DIAGNOSIS — R059 Cough, unspecified: Secondary | ICD-10-CM | POA: Diagnosis not present

## 2021-03-16 NOTE — Telephone Encounter (Signed)
Dr. Kim please advise 

## 2021-03-16 NOTE — Telephone Encounter (Signed)
Patient came in yesterday and seen dr Maudie Mercury and was put on symbicort 2 puffs and she did it this morning and it made her very jittery and wants to know if she can do 1 puff and see if that is better for her. [phone].

## 2021-03-17 DIAGNOSIS — M9901 Segmental and somatic dysfunction of cervical region: Secondary | ICD-10-CM | POA: Diagnosis not present

## 2021-03-17 DIAGNOSIS — M9903 Segmental and somatic dysfunction of lumbar region: Secondary | ICD-10-CM | POA: Diagnosis not present

## 2021-03-17 DIAGNOSIS — M9902 Segmental and somatic dysfunction of thoracic region: Secondary | ICD-10-CM | POA: Diagnosis not present

## 2021-03-17 DIAGNOSIS — M5137 Other intervertebral disc degeneration, lumbosacral region: Secondary | ICD-10-CM | POA: Diagnosis not present

## 2021-03-17 NOTE — Telephone Encounter (Signed)
Called and left a voicemail asking for patient to return call to discuss.  °

## 2021-03-17 NOTE — Telephone Encounter (Signed)
Yes okay to do 1 puff twice a day.

## 2021-03-17 NOTE — Telephone Encounter (Signed)
Patient called and I told her she could do 1 puff 2x a day. And she said that was fine.

## 2021-03-19 LAB — CBC WITH DIFFERENTIAL/PLATELET
Basophils Absolute: 0 10*3/uL (ref 0.0–0.2)
Basos: 1 %
EOS (ABSOLUTE): 0.1 10*3/uL (ref 0.0–0.4)
Eos: 1 %
Hematocrit: 33.7 % — ABNORMAL LOW (ref 34.0–46.6)
Hemoglobin: 11.6 g/dL (ref 11.1–15.9)
Immature Grans (Abs): 0 10*3/uL (ref 0.0–0.1)
Immature Granulocytes: 0 %
Lymphocytes Absolute: 0.8 10*3/uL (ref 0.7–3.1)
Lymphs: 15 %
MCH: 32.1 pg (ref 26.6–33.0)
MCHC: 34.4 g/dL (ref 31.5–35.7)
MCV: 93 fL (ref 79–97)
Monocytes Absolute: 0.4 10*3/uL (ref 0.1–0.9)
Monocytes: 8 %
Neutrophils Absolute: 4.1 10*3/uL (ref 1.4–7.0)
Neutrophils: 75 %
Platelets: 214 10*3/uL (ref 150–450)
RBC: 3.61 x10E6/uL — ABNORMAL LOW (ref 3.77–5.28)
RDW: 11.9 % (ref 11.7–15.4)
WBC: 5.5 10*3/uL (ref 3.4–10.8)

## 2021-03-19 LAB — STREP PNEUMONIAE 23 SEROTYPES IGG
Pneumo Ab Type 1*: 2 ug/mL (ref >1.3)
Pneumo Ab Type 12 (12F)*: 1.2 ug/mL — ABNORMAL LOW (ref >1.3)
Pneumo Ab Type 14*: >18.7 ug/mL (ref >1.3)
Pneumo Ab Type 17 (17F)*: 4.5 ug/mL (ref >1.3)
Pneumo Ab Type 19 (19F)*: 3.5 ug/mL (ref >1.3)
Pneumo Ab Type 2*: 4 ug/mL (ref >1.3)
Pneumo Ab Type 20*: 6.4 ug/mL (ref >1.3)
Pneumo Ab Type 22 (22F)*: 0.7 ug/mL — ABNORMAL LOW (ref >1.3)
Pneumo Ab Type 23 (23F)*: 2.1 ug/mL (ref >1.3)
Pneumo Ab Type 26 (6B)*: 2.4 ug/mL (ref >1.3)
Pneumo Ab Type 3*: 3 ug/mL (ref >1.3)
Pneumo Ab Type 34 (10A)*: >19.5 ug/mL (ref >1.3)
Pneumo Ab Type 4*: 0.5 ug/mL — ABNORMAL LOW (ref >1.3)
Pneumo Ab Type 43 (11A)*: 3.7 ug/mL (ref >1.3)
Pneumo Ab Type 5*: 6.5 ug/mL (ref >1.3)
Pneumo Ab Type 51 (7F)*: >13.6 ug/mL (ref >1.3)
Pneumo Ab Type 54 (15B)*: 3.5 ug/mL (ref >1.3)
Pneumo Ab Type 56 (18C)*: >8.1 ug/mL (ref >1.3)
Pneumo Ab Type 57 (19A)*: 6 ug/mL (ref >1.3)
Pneumo Ab Type 68 (9V)*: 1.8 ug/mL (ref >1.3)
Pneumo Ab Type 70 (33F)*: 1.6 ug/mL (ref >1.3)
Pneumo Ab Type 8*: 2.8 ug/mL (ref >1.3)
Pneumo Ab Type 9 (9N)*: 1.4 ug/mL (ref >1.3)

## 2021-03-19 LAB — IGG, IGA, IGM
IgA/Immunoglobulin A, Serum: 121 mg/dL (ref 87–352)
IgG (Immunoglobin G), Serum: 1057 mg/dL (ref 586–1602)
IgM (Immunoglobulin M), Srm: 116 mg/dL (ref 26–217)

## 2021-03-19 LAB — DIPHTHERIA / TETANUS ANTIBODY PANEL
Diphtheria Ab: 0.97 IU/mL (ref <0.10)
Tetanus Ab, IgG: 5.07 IU/mL (ref <0.10)

## 2021-03-19 LAB — COMPLEMENT, TOTAL: Compl, Total (CH50): >60 U/mL (ref >41)

## 2021-03-23 ENCOUNTER — Ambulatory Visit: Payer: Medicare HMO | Admitting: Allergy and Immunology

## 2021-03-24 ENCOUNTER — Telehealth: Payer: Self-pay | Admitting: Allergy

## 2021-03-24 NOTE — Telephone Encounter (Signed)
Called patient and advised of Dr. Julianne Rice recommendation. Patient states that her breathing is better and she will stop the medication. She asked what to do if the stomach issues persist. I advised that if she believes that it is from the budesonide than it should stop, I advised that if it persists after stopping the budesonide then she should call her PCP. Patient verbalized understanding.

## 2021-03-24 NOTE — Telephone Encounter (Signed)
That is usually not a common complaint after using budesonide.  If her breathing is doing better, then okay to stop. Otherwise, she can try to use it once a day instead of twice a day.

## 2021-03-24 NOTE — Telephone Encounter (Signed)
Kristy Garza called in and states she believes she is having a reaction to Budesonide for the nebulizer.  Kristy Garza states starting yesterday she has noticed stomach pain, bloating, and cramps.  Kristy Garza states she read in the leaflet that budesonide can cause gastroenteritis and states she thinks this is what is happening to her.  Kristy Garza wants to know if she can stop using it.  Please advise.

## 2021-03-26 ENCOUNTER — Encounter: Payer: Self-pay | Admitting: Family Medicine

## 2021-03-26 ENCOUNTER — Ambulatory Visit (INDEPENDENT_AMBULATORY_CARE_PROVIDER_SITE_OTHER): Payer: Medicare HMO

## 2021-03-26 ENCOUNTER — Ambulatory Visit: Payer: Medicare HMO | Admitting: Family Medicine

## 2021-03-26 ENCOUNTER — Ambulatory Visit (INDEPENDENT_AMBULATORY_CARE_PROVIDER_SITE_OTHER): Payer: Medicare HMO | Admitting: Family Medicine

## 2021-03-26 VITALS — BP 109/73 | HR 79 | Temp 98.7°F | Ht 68.5 in | Wt 106.0 lb

## 2021-03-26 DIAGNOSIS — R109 Unspecified abdominal pain: Secondary | ICD-10-CM | POA: Diagnosis not present

## 2021-03-26 DIAGNOSIS — R1084 Generalized abdominal pain: Secondary | ICD-10-CM

## 2021-03-26 DIAGNOSIS — K581 Irritable bowel syndrome with constipation: Secondary | ICD-10-CM

## 2021-03-26 NOTE — Progress Notes (Signed)
Subjective:  Patient ID: Kristy Garza, female    DOB: September 02, 1950, 70 y.o.   MRN: [id number]  Patient Care Team: [name], DO as PCP - General (Family Medicine) Deneise Lever, MD as Consulting Physician (Pulmonary Disease) Minus Breeding, MD as Consulting Physician (Cardiology) Shea Evans [name], LCSW as Social Worker (Licensed Clinical Social Worker) Ilean China, RN as Case Manager   Chief Complaint:  Abdominal Pain   HPI: Kristy Garza is a 70 y.o. female presenting on 03/26/2021 for Abdominal Pain   Pt presents today for evaluation of abdominal cramping and bloating associated with constipation. She states over the holidays and with travel she did not have regular bowel movements. States she spoke with her GI and did a Miralax clean out. States she has had several bowel movements since starting the Miralax but still has slight cramping at times. No melena, hematochezia, rectal pain, fever, chills, weakness, or decreased urine output.   Abdominal Pain This is a recurrent problem. The current episode started in the past 7 days. The problem occurs intermittently. The problem has been waxing and waning. The pain is located in the generalized abdominal region. The pain is at a severity of 4/10. The pain is mild. The quality of the pain is cramping, aching and a sensation of fullness. The abdominal pain does not radiate. Associated symptoms include constipation. Pertinent negatives include no arthralgias, diarrhea, dysuria, fever, frequency, headaches, myalgias, nausea or vomiting. Nothing aggravates the pain. The pain is relieved by Bowel movements. Treatments tried: Miralax. The treatment provided moderate relief. Her past medical history is significant for irritable bowel syndrome.     Relevant past medical, surgical, family, and social history reviewed and updated as indicated.  Allergies and medications reviewed and updated. Data reviewed: Chart in  Epic.   Past Medical History:  Diagnosis Date   Acid reflux    Allergy    Arthritis    ARTHRITIS IN NECK BY DR. Alroy Dust [facility]   Asthma    Interstitial cystitis    Osteoporosis    PONV (postoperative nausea and vomiting)    Tinnitus    Trigeminal neuralgia    Atypical trigeminal neuralgia    Past Surgical History:  Procedure Laterality Date   ABDOMINAL HYSTERECTOMY  2000   CYSTOSCOPY WITH HYDRODISTENSION AND BIOPSY N/A 06/11/2012   Procedure: CYSTOSCOPY/BIOPSY/HYDRODISTENSION with Instillation of Pyridium and Marcaine and Kenalog;  Surgeon: Ailene Rud, MD;  Location: [facility];  Service: Urology;  Laterality: N/A;  1 hour requested for this case  BLADDER BIOPSY   ETHMOIDECTOMY  2012   SEPTOPLASTY  1980's   vocal cord surgery   1990's   polyp removal    Social History   Socioeconomic History   Marital status: Married    Spouse name: [name]   Number of children: 3   Years of education: 12   Highest education level: Some college, no degree  Occupational History   Occupation: CNA    Comment: Retired  Tobacco Use   Smoking status: Never   Smokeless tobacco: Never  Vaping Use   Vaping Use: Never used  Substance and Sexual Activity   Alcohol use: No    Alcohol/week: 0.0 standard drinks    Comment: 01-19-2016 per pt no   Drug use: No    Comment: 01-19-2016 per pt no    Sexual activity: Not Currently    Birth control/protection: Surgical  Other Topics Concern   Not on file  Social History Narrative   Lives with husband.    Social Determinants of Health   Financial Resource Strain: Not on file  Food Insecurity: Not on file  Transportation Needs: Unmet Transportation Needs   Lack of Transportation (Medical): No   Lack of Transportation (Non-Medical): Yes  Physical Activity: Not on file  Stress: Stress Concern Present   Feeling of Stress : To some extent  Social Connections: Not on file  Intimate Partner Violence: Not on file     Outpatient Encounter Medications as of 03/26/2021  Medication Sig   acetaminophen (TYLENOL) 500 MG tablet Take by mouth.   albuterol (PROVENTIL) (2.5 MG/3ML) 0.083% nebulizer solution Take 3 mLs (2.5 mg total) by nebulization every 4 (four) hours as needed for wheezing or shortness of breath (coughing fits).   albuterol (VENTOLIN HFA) 108 (90 Base) MCG/ACT inhaler USE 2 PUFFS EVERY 4 HOURS AS NEEDED FOR COUGH, WHEEZE, TIGHTNESS IN CHEST, OR SHORTNESS OF BREATH   ascorbic acid (VITAMIN C) 1000 MG tablet Take by mouth.   aspirin-acetaminophen-caffeine (EXCEDRIN MIGRAINE) 250-250-65 MG tablet Take by mouth.   b complex vitamins capsule Take 1 capsule by mouth daily.   benzonatate (TESSALON) 200 MG capsule Take 1 capsule (200 mg total) by mouth 3 (three) times daily as needed.   budesonide (PULMICORT) 0.5 MG/2ML nebulizer solution Take 2 mLs (0.5 mg total) by nebulization in the morning and at bedtime.   budesonide-formoterol (SYMBICORT) 80-4.5 MCG/ACT inhaler Inhale 2 puffs into the lungs in the morning and at bedtime. with spacer and rinse mouth afterwards.   calcium citrate-vitamin D (CITRACAL+D) 315-200 MG-UNIT tablet Take by mouth.   Cholecalciferol 25 MCG (1000 UT) tablet Take by mouth.   Cholecalciferol 50 MCG (2000 UT) CAPS Take 1 capsule by mouth daily.   cycloSPORINE (RESTASIS) 0.05 % ophthalmic emulsion PLACE ONE DROP INTO BOTH EYES TWICE A DAY   Dermatological Products, Misc. (SUVICORT) EMUL    DHEA 25 MG CAPS Take by mouth.   Doxepin HCl 3 MG TABS Take 1 tablet (3 mg total) by mouth at bedtime as needed (sleep).   fexofenadine (ALLEGRA) 60 MG tablet Take by mouth.   lidocaine (XYLOCAINE) 2 % jelly APPLY TOPICALLY TO AFFECTED AREA EVERY DAY AS NEEDED (use sparingly)   Lidocaine-Collagen-Aloe Vera (REGENECARE) 2 % GEL Apply topically.   LORazepam (ATIVAN) 0.5 MG tablet Take 1 tablet (0.5 mg total) by mouth 2 (two) times daily as needed for anxiety.   magnesium oxide (MAG-OX) 400 MG  tablet Take by mouth.   melatonin 5 MG TABS Take 5 mg by mouth.   mometasone (NASONEX) 50 MCG/ACT nasal spray PLACE 2 SPRAYS INTO THE NOSE DAILY.   nystatin (MYCOSTATIN) 100000 UNIT/ML suspension Take 5 mLs (500,000 Units total) by mouth 4 (four) times daily. Swish and swallow   pantoprazole (PROTONIX) 40 MG tablet Take 1 tablet (40 mg total) by mouth daily.   Plecanatide (TRULANCE) 3 MG TABS    Polyethylene Glycol 3350 (MIRALAX PO) Take by mouth as needed.   Probiotic Product (PROBIOTIC PO) Take by mouth daily.   Simethicone (GAS-X PO) Take by mouth.   tizanidine (ZANAFLEX) 2 MG capsule Take 1-2 capsules (2-4 mg total) by mouth 3 (three) times daily as needed for muscle spasms.   traMADol (ULTRAM) 50 MG tablet Take 1 tablet (50 mg total) by mouth every 12 (twelve) hours as needed.   UNABLE TO FIND Take 1 capsule by mouth daily. Dv3- vitamin D 3 plus immune support   Facility-Administered Encounter  Medications as of 03/26/2021  Medication   zoledronic acid (RECLAST) injection 5 mg    Allergies  Allergen Reactions   Cefuroxime Axetil Other (See Comments)    Extreme gas   Gabapentin Other (See Comments)    Constipation Constipation   Montelukast Other (See Comments)    Numbness and tingling in hand. Numbness and tingling in hand. Numbness and tingling in hand. NUMBNESS OF HANDS AND FEET. Numbness in hands and feet NUMBNESS OF HANDS AND FEET.   Pregabalin Other (See Comments)    Drowsiness, dry mouth Drowsiness, dry mouth Makes her tired and thirsty   Augmentin [Amoxicillin-Pot Clavulanate] Diarrhea   Avelox [Moxifloxacin Hcl In Nacl] Other (See Comments)    Tremors    Biaxin [Clarithromycin]     Unsure of reaction   Cedax [Ceftibuten] Other (See Comments)    tremors   Ciprofloxacin Other (See Comments) and Nausea And Vomiting    Blurred vision Blurred vision Pt can't remember reaction   Clindamycin/Lincomycin     tachycardia   Levofloxacin Other (See Comments)     Feels dehydrated   Lincomycin Other (See Comments)    tachycardia   Montelukast Sodium Other (See Comments)    Numbness and tingling in hand. Numbness and tingling in hand.   Sulfonamide Derivatives Other (See Comments)    Gi upset   Sulfa Antibiotics Nausea Only    Other reaction(s): Other (See Comments) Gi upset  Other reaction(s): Other (See Comments) Gi upset Other reaction(s): Unknown    Review of Systems  Constitutional:  Negative for activity change, appetite change, chills, diaphoresis, fatigue, fever and unexpected weight change.  HENT: Negative.    Eyes: Negative.   Respiratory:  Negative for cough, chest tightness and shortness of breath.   Cardiovascular:  Negative for chest pain, palpitations and leg swelling.  Gastrointestinal:  Positive for abdominal distention, abdominal pain and constipation. Negative for anal bleeding, blood in stool, diarrhea, nausea, rectal pain and vomiting.  Endocrine: Negative.   Genitourinary:  Negative for decreased urine volume, difficulty urinating, dysuria, frequency and urgency.  Musculoskeletal:  Negative for arthralgias and myalgias.  Skin: Negative.   Allergic/Immunologic: Negative.   Neurological:  Negative for dizziness and headaches.  Hematological: Negative.   Psychiatric/Behavioral:  Negative for confusion, hallucinations, sleep disturbance and suicidal ideas.   All other systems reviewed and are negative.    X-Ray: KUB: gas and stool throughout, no obvious obstruction, no significant stool burden. No acute findings. Preliminary x-ray reading by Monia Pouch, FNP-C, [facility].  Objective:  BP 109/73    Pulse 79    Temp 98.7 F (37.1 C)    Ht 5' 8.5" (1.74 m)    Wt 106 lb (48.1 kg)    SpO2 96%    BMI 15.88 kg/m    Wt Readings from Last 3 Encounters:  03/26/21 106 lb (48.1 kg)  03/15/21 111 lb 12.8 oz (50.7 kg)  03/05/21 108 lb (49 kg)    Physical Exam Vitals and nursing note reviewed.  Constitutional:      General:  She is not in acute distress.    Appearance: Normal appearance. She is well-developed, well-groomed and underweight. She is not ill-appearing, toxic-appearing or diaphoretic.  HENT:     Head: Normocephalic and atraumatic.     Jaw: There is normal jaw occlusion.     Right Ear: Hearing normal.     Left Ear: Hearing normal.     Nose: Nose normal.     Mouth/Throat:  Lips: Pink.     Mouth: Mucous membranes are moist.     Pharynx: Oropharynx is clear. Uvula midline.  Eyes:     General: Lids are normal.     Extraocular Movements: Extraocular movements intact.     Conjunctiva/sclera: Conjunctivae normal.     Pupils: Pupils are equal, round, and reactive to light.  Neck:     Thyroid: No thyroid mass, thyromegaly or thyroid tenderness.     Vascular: No carotid bruit or JVD.     Trachea: Trachea and phonation normal.  Cardiovascular:     Rate and Rhythm: Normal rate and regular rhythm.     Chest Wall: PMI is not displaced.     Pulses: Normal pulses.     Heart sounds: Normal heart sounds. No murmur heard.   No friction rub. No gallop.  Pulmonary:     Effort: Pulmonary effort is normal. No respiratory distress.     Breath sounds: Normal breath sounds. No wheezing.  Abdominal:     General: Abdomen is flat. Bowel sounds are normal. There is no distension or abdominal bruit. There are no signs of injury.     Palpations: Abdomen is soft. There is no shifting dullness, fluid wave, hepatomegaly, splenomegaly, mass or pulsatile mass.     Tenderness: There is no abdominal tenderness. There is no right CVA tenderness, left CVA tenderness, guarding or rebound. Negative signs include Murphy's sign, Rovsing's sign, McBurney's sign, psoas sign and obturator sign.     Hernia: No hernia is present.  Musculoskeletal:        General: Normal range of motion.     Cervical back: Normal range of motion and neck supple.     Right lower leg: No edema.     Left lower leg: No edema.  Lymphadenopathy:      Cervical: No cervical adenopathy.  Skin:    General: Skin is warm and dry.     Capillary Refill: Capillary refill takes less than 2 seconds.     Coloration: Skin is not cyanotic, jaundiced or pale.     Findings: No rash.  Neurological:     General: No focal deficit present.     Mental Status: She is alert and oriented to person, place, and time.     Sensory: Sensation is intact.     Motor: Motor function is intact.     Coordination: Coordination is intact.     Gait: Gait is intact.     Deep Tendon Reflexes: Reflexes are normal and symmetric.  Psychiatric:        Attention and Perception: Attention and perception normal.        Mood and Affect: Mood and affect normal.        Speech: Speech normal.        Behavior: Behavior normal. Behavior is cooperative.        Thought Content: Thought content normal.        Cognition and Memory: Cognition and memory normal.        Judgment: Judgment normal.    Results for orders placed or performed in visit on 03/15/21  CBC with Differential/Platelet  Result Value Ref Range   WBC 5.5 3.4 - 10.8 x10E3/uL   RBC 3.61 (L) 3.77 - 5.28 x10E6/uL   Hemoglobin 11.6 11.1 - 15.9 g/dL   Hematocrit 33.7 (L) 34.0 - 46.6 %   MCV 93 79 - 97 fL   MCH 32.1 26.6 - 33.0 pg   MCHC 34.4 31.5 - 35.7  g/dL   RDW 11.9 11.7 - 15.4 %   Platelets 214 150 - 450 x10E3/uL   Neutrophils 75 Not Estab. %   Lymphs 15 Not Estab. %   Monocytes 8 Not Estab. %   Eos 1 Not Estab. %   Basos 1 Not Estab. %   Neutrophils Absolute 4.1 1.4 - 7.0 x10E3/uL   Lymphocytes Absolute 0.8 0.7 - 3.1 x10E3/uL   Monocytes Absolute 0.4 0.1 - 0.9 x10E3/uL   EOS (ABSOLUTE) 0.1 0.0 - 0.4 x10E3/uL   Basophils Absolute 0.0 0.0 - 0.2 x10E3/uL   Immature Granulocytes 0 Not Estab. %   Immature Grans (Abs) 0.0 0.0 - 0.1 x10E3/uL  IgG, IgA, IgM  Result Value Ref Range   IgG (Immunoglobin G), Serum 1,057 586 - 1,602 mg/dL   IgA/Immunoglobulin A, Serum 121 87 - 352 mg/dL   IgM (Immunoglobulin M),  Srm 116 26 - 217 mg/dL  Strep pneumoniae 23 Serotypes IgG  Result Value Ref Range   Pneumo Ab Type 1* 2.0 >1.3 ug/mL   Pneumo Ab Type 3* 3.0 >1.3 ug/mL   Pneumo Ab Type 4* 0.5 (L) >1.3 ug/mL   Pneumo Ab Type 8* 2.8 >1.3 ug/mL   Pneumo Ab Type 9 (9N)* 1.4 >1.3 ug/mL   Pneumo Ab Type 12 (60F)* 1.2 (L) >1.3 ug/mL   Pneumo Ab Type 14* >18.7 >1.3 ug/mL   Pneumo Ab Type 17 (110F)* 4.5 >1.3 ug/mL   Pneumo Ab Type 19 (89F)* 3.5 >1.3 ug/mL   Pneumo Ab Type 2* 4.0 >1.3 ug/mL   Pneumo Ab Type 20* 6.4 >1.3 ug/mL   Pneumo Ab Type 22 (66F)* 0.7 (L) >1.3 ug/mL   Pneumo Ab Type 23 (63F)* 2.1 >1.3 ug/mL   Pneumo Ab Type 26 (6B)* 2.4 >1.3 ug/mL   Pneumo Ab Type 34 (10A)* >19.5 >1.3 ug/mL   Pneumo Ab Type 43 (11A)* 3.7 >1.3 ug/mL   Pneumo Ab Type 5* 6.5 >1.3 ug/mL   Pneumo Ab Type 51 (110F)* >13.6 >1.3 ug/mL   Pneumo Ab Type 54 (15B)* 3.5 >1.3 ug/mL   Pneumo Ab Type 56 (18C)* >8.1 >1.3 ug/mL   Pneumo Ab Type 57 (19A)* 6.0 >1.3 ug/mL   Pneumo Ab Type 68 (9V)* 1.8 >1.3 ug/mL   Pneumo Ab Type 70 (110F)* 1.6 >1.3 ug/mL  Complement, total  Result Value Ref Range   Compl, Total (CH50) >60 >41 U/mL  Diphtheria / Tetanus Antibody Panel  Result Value Ref Range   Tetanus Ab, IgG 5.07 <0.10 IU/mL   Diphtheria Ab 0.97 <0.10 IU/mL       Pertinent labs & imaging results that were available during my care of the patient were reviewed by me and considered in my medical decision making.  Assessment & Plan:  Tammara was seen today for abdominal pain.  Diagnoses and all orders for this visit:  Generalized abdominal pain Irritable bowel syndrome with constipation KUB without acute findings. Improvement after using Miralax. Pt aware to continue daily Miralax to prevent future constipation. Increase water and fiber intake. Report any new, worsening, or persistent symptoms. Aware of red flags which require emergent evaluation.  -     DG Abd 1 View     Continue all other maintenance medications.  Follow up  plan: Return if symptoms worsen or fail to improve.   Continue healthy lifestyle choices, including diet (rich in fruits, vegetables, and lean proteins, and low in salt and simple carbohydrates) and exercise (at least 30 minutes of moderate physical activity daily).  Educational  handout given for IBS  The above assessment and management plan was discussed with the patient. The patient verbalized understanding of and has agreed to the management plan. Patient is aware to call the clinic if they develop any new symptoms or if symptoms persist or worsen. Patient is aware when to return to the clinic for a follow-up visit. Patient educated on when it is appropriate to go to the emergency department.   Monia Pouch, FNP-C [facility] [phone]

## 2021-03-27 DIAGNOSIS — F411 Generalized anxiety disorder: Secondary | ICD-10-CM | POA: Diagnosis not present

## 2021-03-27 DIAGNOSIS — R69 Illness, unspecified: Secondary | ICD-10-CM | POA: Diagnosis not present

## 2021-03-27 DIAGNOSIS — J4520 Mild intermittent asthma, uncomplicated: Secondary | ICD-10-CM

## 2021-04-01 ENCOUNTER — Ambulatory Visit (INDEPENDENT_AMBULATORY_CARE_PROVIDER_SITE_OTHER): Payer: Medicare HMO | Admitting: Family Medicine

## 2021-04-01 ENCOUNTER — Encounter: Payer: Self-pay | Admitting: Family Medicine

## 2021-04-01 VITALS — BP 116/76 | HR 87 | Temp 98.0°F | Ht 68.5 in | Wt 106.0 lb

## 2021-04-01 DIAGNOSIS — M9903 Segmental and somatic dysfunction of lumbar region: Secondary | ICD-10-CM | POA: Diagnosis not present

## 2021-04-01 DIAGNOSIS — M5137 Other intervertebral disc degeneration, lumbosacral region: Secondary | ICD-10-CM | POA: Diagnosis not present

## 2021-04-01 DIAGNOSIS — M9901 Segmental and somatic dysfunction of cervical region: Secondary | ICD-10-CM | POA: Diagnosis not present

## 2021-04-01 DIAGNOSIS — R300 Dysuria: Secondary | ICD-10-CM

## 2021-04-01 DIAGNOSIS — M9902 Segmental and somatic dysfunction of thoracic region: Secondary | ICD-10-CM | POA: Diagnosis not present

## 2021-04-01 LAB — MICROSCOPIC EXAMINATION
Epithelial Cells (non renal): NONE SEEN /hpf (ref 0–10)
RBC, Urine: NONE SEEN /hpf (ref 0–2)
Renal Epithel, UA: NONE SEEN /hpf
WBC, UA: NONE SEEN /hpf (ref 0–5)

## 2021-04-01 LAB — URINALYSIS, ROUTINE W REFLEX MICROSCOPIC
Bilirubin, UA: NEGATIVE
Glucose, UA: NEGATIVE
Ketones, UA: NEGATIVE
Nitrite, UA: NEGATIVE
Protein,UA: NEGATIVE
RBC, UA: NEGATIVE
Specific Gravity, UA: 1.01 (ref 1.005–1.030)
Urobilinogen, Ur: 0.2 mg/dL (ref 0.2–1.0)
pH, UA: 7 (ref 5.0–7.5)

## 2021-04-01 NOTE — Progress Notes (Signed)
Subjective:  Patient ID: Kristy Garza, female    DOB: 12-Feb-1951, 71 y.o.   MRN: [id number]  Patient Care Team: [name], DO as PCP - General (Family Medicine) Deneise Lever, MD as Consulting Physician (Pulmonary Disease) Minus Breeding, MD as Consulting Physician (Cardiology) Shea Evans [name], LCSW as Social Worker (Licensed Clinical Social Worker) Ilean China, RN as Case Manager   Chief Complaint:  Dysuria (Incontinence, burning)   HPI: Kristy Garza is a 71 y.o. female presenting on 04/01/2021 for Dysuria (Incontinence, burning)   Dysuria  This is a new problem. The current episode started yesterday. The problem occurs every urination. The problem has been unchanged. The quality of the pain is described as burning. The pain is at a severity of 3/10. The pain is mild. There has been no fever. She is Not sexually active. There is No history of pyelonephritis. Associated symptoms include frequency. Pertinent negatives include no chills, discharge, flank pain, hematuria, hesitancy, nausea, possible pregnancy, sweats, urgency or vomiting. She has tried increased fluids for the symptoms. The treatment provided no relief.    Relevant past medical, surgical, family, and social history reviewed and updated as indicated.  Allergies and medications reviewed and updated. Data reviewed: Chart in Epic.   Past Medical History:  Diagnosis Date   Acid reflux    Allergy    Arthritis    ARTHRITIS IN NECK BY DR. Alroy Dust [facility]   Asthma    Interstitial cystitis    Osteoporosis    PONV (postoperative nausea and vomiting)    Tinnitus    Trigeminal neuralgia    Atypical trigeminal neuralgia    Past Surgical History:  Procedure Laterality Date   ABDOMINAL HYSTERECTOMY  2000   CYSTOSCOPY WITH HYDRODISTENSION AND BIOPSY N/A 06/11/2012   Procedure: CYSTOSCOPY/BIOPSY/HYDRODISTENSION with Instillation of Pyridium and Marcaine and Kenalog;  Surgeon: Ailene Rud,  MD;  Location: [facility];  Service: Urology;  Laterality: N/A;  1 hour requested for this case  BLADDER BIOPSY   ETHMOIDECTOMY  2012   SEPTOPLASTY  1980's   vocal cord surgery   1990's   polyp removal    Social History   Socioeconomic History   Marital status: Married    Spouse name: [name]   Number of children: 3   Years of education: 12   Highest education level: Some college, no degree  Occupational History   Occupation: CNA    Comment: Retired  Tobacco Use   Smoking status: Never   Smokeless tobacco: Never  Vaping Use   Vaping Use: Never used  Substance and Sexual Activity   Alcohol use: No    Alcohol/week: 0.0 standard drinks    Comment: 01-19-2016 per pt no   Drug use: No    Comment: 01-19-2016 per pt no    Sexual activity: Not Currently    Birth control/protection: Surgical  Other Topics Concern   Not on file  Social History Narrative   Lives with husband.    Social Determinants of Health   Financial Resource Strain: Not on file  Food Insecurity: Not on file  Transportation Needs: Unmet Transportation Needs   Lack of Transportation (Medical): No   Lack of Transportation (Non-Medical): Yes  Physical Activity: Not on file  Stress: Stress Concern Present   Feeling of Stress : To some extent  Social Connections: Not on file  Intimate Partner Violence: Not on file    Outpatient Encounter Medications as of 04/01/2021  Medication Sig   acetaminophen (TYLENOL) 500 MG tablet Take by mouth.   albuterol (PROVENTIL) (2.5 MG/3ML) 0.083% nebulizer solution Take 3 mLs (2.5 mg total) by nebulization every 4 (four) hours as needed for wheezing or shortness of breath (coughing fits).   albuterol (VENTOLIN HFA) 108 (90 Base) MCG/ACT inhaler USE 2 PUFFS EVERY 4 HOURS AS NEEDED FOR COUGH, WHEEZE, TIGHTNESS IN CHEST, OR SHORTNESS OF BREATH   ascorbic acid (VITAMIN C) 1000 MG tablet Take by mouth.   aspirin-acetaminophen-caffeine (EXCEDRIN MIGRAINE) 250-250-65  MG tablet Take by mouth.   b complex vitamins capsule Take 1 capsule by mouth daily.   benzonatate (TESSALON) 200 MG capsule Take 1 capsule (200 mg total) by mouth 3 (three) times daily as needed.   budesonide (PULMICORT) 0.5 MG/2ML nebulizer solution Take 2 mLs (0.5 mg total) by nebulization in the morning and at bedtime.   budesonide-formoterol (SYMBICORT) 80-4.5 MCG/ACT inhaler Inhale 2 puffs into the lungs in the morning and at bedtime. with spacer and rinse mouth afterwards.   calcium citrate-vitamin D (CITRACAL+D) 315-200 MG-UNIT tablet Take by mouth.   Cholecalciferol 25 MCG (1000 UT) tablet Take by mouth.   Cholecalciferol 50 MCG (2000 UT) CAPS Take 1 capsule by mouth daily.   cycloSPORINE (RESTASIS) 0.05 % ophthalmic emulsion PLACE ONE DROP INTO BOTH EYES TWICE A DAY   Dermatological Products, Misc. (SUVICORT) EMUL    DHEA 25 MG CAPS Take by mouth.   Doxepin HCl 3 MG TABS Take 1 tablet (3 mg total) by mouth at bedtime as needed (sleep).   fexofenadine (ALLEGRA) 60 MG tablet Take by mouth.   lidocaine (XYLOCAINE) 2 % jelly APPLY TOPICALLY TO AFFECTED AREA EVERY DAY AS NEEDED (use sparingly)   Lidocaine-Collagen-Aloe Vera (REGENECARE) 2 % GEL Apply topically.   LORazepam (ATIVAN) 0.5 MG tablet Take 1 tablet (0.5 mg total) by mouth 2 (two) times daily as needed for anxiety.   magnesium oxide (MAG-OX) 400 MG tablet Take by mouth.   melatonin 5 MG TABS Take 5 mg by mouth.   mometasone (NASONEX) 50 MCG/ACT nasal spray PLACE 2 SPRAYS INTO THE NOSE DAILY.   nystatin (MYCOSTATIN) 100000 UNIT/ML suspension Take 5 mLs (500,000 Units total) by mouth 4 (four) times daily. Swish and swallow   pantoprazole (PROTONIX) 40 MG tablet Take 1 tablet (40 mg total) by mouth daily.   Plecanatide (TRULANCE) 3 MG TABS    Polyethylene Glycol 3350 (MIRALAX PO) Take by mouth as needed.   Probiotic Product (PROBIOTIC PO) Take by mouth daily.   Simethicone (GAS-X PO) Take by mouth.   tizanidine (ZANAFLEX) 2 MG  capsule Take 1-2 capsules (2-4 mg total) by mouth 3 (three) times daily as needed for muscle spasms.   traMADol (ULTRAM) 50 MG tablet Take 1 tablet (50 mg total) by mouth every 12 (twelve) hours as needed.   UNABLE TO FIND Take 1 capsule by mouth daily. Dv3- vitamin D 3 plus immune support   Facility-Administered Encounter Medications as of 04/01/2021  Medication   zoledronic acid (RECLAST) injection 5 mg    Allergies  Allergen Reactions   Cefuroxime Axetil Other (See Comments)    Extreme gas   Gabapentin Other (See Comments)    Constipation Constipation   Montelukast Other (See Comments)    Numbness and tingling in hand. Numbness and tingling in hand. Numbness and tingling in hand. NUMBNESS OF HANDS AND FEET. Numbness in hands and feet NUMBNESS OF HANDS AND FEET.   Pregabalin Other (See Comments)  Drowsiness, dry mouth Drowsiness, dry mouth Makes her tired and thirsty   Augmentin [Amoxicillin-Pot Clavulanate] Diarrhea   Avelox [Moxifloxacin Hcl In Nacl] Other (See Comments)    Tremors    Biaxin [Clarithromycin]     Unsure of reaction   Cedax [Ceftibuten] Other (See Comments)    tremors   Ciprofloxacin Other (See Comments) and Nausea And Vomiting    Blurred vision Blurred vision Pt can't remember reaction   Clindamycin/Lincomycin     tachycardia   Levofloxacin Other (See Comments)    Feels dehydrated   Lincomycin Other (See Comments)    tachycardia   Montelukast Sodium Other (See Comments)    Numbness and tingling in hand. Numbness and tingling in hand.   Sulfonamide Derivatives Other (See Comments)    Gi upset   Sulfa Antibiotics Nausea Only    Other reaction(s): Other (See Comments) Gi upset  Other reaction(s): Other (See Comments) Gi upset Other reaction(s): Unknown    Review of Systems  Constitutional:  Negative for activity change, appetite change, chills, diaphoresis, fatigue, fever and unexpected weight change.  Gastrointestinal:  Negative for  abdominal pain, nausea and vomiting.  Genitourinary:  Positive for dysuria and frequency. Negative for decreased urine volume, difficulty urinating, enuresis, flank pain, genital sores, hematuria, hesitancy, urgency, vaginal bleeding, vaginal discharge and vaginal pain.  Musculoskeletal:  Negative for back pain.  Neurological:  Negative for weakness.  Psychiatric/Behavioral:  Negative for confusion.   All other systems reviewed and are negative.      Objective:  BP 116/76    Pulse 87    Temp 98 F (36.7 C)    Ht 5' 8.5" (1.74 m)    Wt 106 lb (48.1 kg)    SpO2 93%    BMI 15.88 kg/m    Wt Readings from Last 3 Encounters:  04/01/21 106 lb (48.1 kg)  03/26/21 106 lb (48.1 kg)  03/15/21 111 lb 12.8 oz (50.7 kg)    Physical Exam Vitals and nursing note reviewed.  Constitutional:      Appearance: Normal appearance. She is underweight.  HENT:     Head: Normocephalic and atraumatic.  Eyes:     Conjunctiva/sclera: Conjunctivae normal.     Pupils: Pupils are equal, round, and reactive to light.  Cardiovascular:     Rate and Rhythm: Normal rate and regular rhythm.     Heart sounds: Normal heart sounds. No murmur heard.   No friction rub. No gallop.  Pulmonary:     Effort: Pulmonary effort is normal.     Breath sounds: Normal breath sounds.  Abdominal:     General: Abdomen is flat. There is no distension.     Palpations: Abdomen is soft. There is no mass.     Tenderness: There is no abdominal tenderness. There is no right CVA tenderness, left CVA tenderness, guarding or rebound.     Hernia: No hernia is present.  Skin:    General: Skin is warm and dry.     Capillary Refill: Capillary refill takes less than 2 seconds.  Neurological:     General: No focal deficit present.     Mental Status: She is alert and oriented to person, place, and time.  Psychiatric:        Mood and Affect: Mood normal.        Behavior: Behavior normal.        Thought Content: Thought content normal.         Judgment: Judgment normal.    Results  for orders placed or performed in visit on 03/15/21  CBC with Differential/Platelet  Result Value Ref Range   WBC 5.5 3.4 - 10.8 x10E3/uL   RBC 3.61 (L) 3.77 - 5.28 x10E6/uL   Hemoglobin 11.6 11.1 - 15.9 g/dL   Hematocrit 33.7 (L) 34.0 - 46.6 %   MCV 93 79 - 97 fL   MCH 32.1 26.6 - 33.0 pg   MCHC 34.4 31.5 - 35.7 g/dL   RDW 11.9 11.7 - 15.4 %   Platelets 214 150 - 450 x10E3/uL   Neutrophils 75 Not Estab. %   Lymphs 15 Not Estab. %   Monocytes 8 Not Estab. %   Eos 1 Not Estab. %   Basos 1 Not Estab. %   Neutrophils Absolute 4.1 1.4 - 7.0 x10E3/uL   Lymphocytes Absolute 0.8 0.7 - 3.1 x10E3/uL   Monocytes Absolute 0.4 0.1 - 0.9 x10E3/uL   EOS (ABSOLUTE) 0.1 0.0 - 0.4 x10E3/uL   Basophils Absolute 0.0 0.0 - 0.2 x10E3/uL   Immature Granulocytes 0 Not Estab. %   Immature Grans (Abs) 0.0 0.0 - 0.1 x10E3/uL  IgG, IgA, IgM  Result Value Ref Range   IgG (Immunoglobin G), Serum 1,057 586 - 1,602 mg/dL   IgA/Immunoglobulin A, Serum 121 87 - 352 mg/dL   IgM (Immunoglobulin M), Srm 116 26 - 217 mg/dL  Strep pneumoniae 23 Serotypes IgG  Result Value Ref Range   Pneumo Ab Type 1* 2.0 >1.3 ug/mL   Pneumo Ab Type 3* 3.0 >1.3 ug/mL   Pneumo Ab Type 4* 0.5 (L) >1.3 ug/mL   Pneumo Ab Type 8* 2.8 >1.3 ug/mL   Pneumo Ab Type 9 (9N)* 1.4 >1.3 ug/mL   Pneumo Ab Type 12 (63F)* 1.2 (L) >1.3 ug/mL   Pneumo Ab Type 14* >18.7 >1.3 ug/mL   Pneumo Ab Type 17 (45F)* 4.5 >1.3 ug/mL   Pneumo Ab Type 19 (43F)* 3.5 >1.3 ug/mL   Pneumo Ab Type 2* 4.0 >1.3 ug/mL   Pneumo Ab Type 20* 6.4 >1.3 ug/mL   Pneumo Ab Type 22 (62F)* 0.7 (L) >1.3 ug/mL   Pneumo Ab Type 23 (22F)* 2.1 >1.3 ug/mL   Pneumo Ab Type 26 (6B)* 2.4 >1.3 ug/mL   Pneumo Ab Type 34 (10A)* >19.5 >1.3 ug/mL   Pneumo Ab Type 43 (11A)* 3.7 >1.3 ug/mL   Pneumo Ab Type 5* 6.5 >1.3 ug/mL   Pneumo Ab Type 51 (72F)* >13.6 >1.3 ug/mL   Pneumo Ab Type 54 (15B)* 3.5 >1.3 ug/mL   Pneumo Ab Type 56 (18C)* >8.1 >1.3  ug/mL   Pneumo Ab Type 57 (19A)* 6.0 >1.3 ug/mL   Pneumo Ab Type 68 (9V)* 1.8 >1.3 ug/mL   Pneumo Ab Type 70 (79F)* 1.6 >1.3 ug/mL  Complement, total  Result Value Ref Range   Compl, Total (CH50) >60 >41 U/mL  Diphtheria / Tetanus Antibody Panel  Result Value Ref Range   Tetanus Ab, IgG 5.07 <0.10 IU/mL   Diphtheria Ab 0.97 <0.10 IU/mL   *Note: Due to a large number of results and/or encounters for the requested time period, some results have not been displayed. A complete set of results can be found in Results Review.     Urinalysis in office with only trace leukocytes.   Pertinent labs & imaging results that were available during my care of the patient were reviewed by me and considered in my medical decision making.  Assessment & Plan:  Denys was seen today for dysuria.  Diagnoses and all orders for this  visit:  Dysuria Urinalysis not indicative of acute urinary tract infection. Culture pending, will initiate treatment if warranted. Avoid bladder irritants such as caffeine. Increase water intake. Report any new, worsening, or persistent symptoms.  -     Urinalysis, Routine w reflex microscopic -     Urine Culture     Continue all other maintenance medications.  Follow up plan: Return if symptoms worsen or fail to improve.   Continue healthy lifestyle choices, including diet (rich in fruits, vegetables, and lean proteins, and low in salt and simple carbohydrates) and exercise (at least 30 minutes of moderate physical activity daily).  Educational handout given for dysuria  The above assessment and management plan was discussed with the patient. The patient verbalized understanding of and has agreed to the management plan. Patient is aware to call the clinic if they develop any new symptoms or if symptoms persist or worsen. Patient is aware when to return to the clinic for a follow-up visit. Patient educated on when it is appropriate to go to the emergency department.    Monia Pouch, FNP-C [facility] [phone]

## 2021-04-02 LAB — URINE CULTURE: Organism ID, Bacteria: NO GROWTH

## 2021-04-05 DIAGNOSIS — N3010 Interstitial cystitis (chronic) without hematuria: Secondary | ICD-10-CM | POA: Diagnosis not present

## 2021-04-12 ENCOUNTER — Encounter: Payer: Self-pay | Admitting: Nurse Practitioner

## 2021-04-12 ENCOUNTER — Other Ambulatory Visit: Payer: Self-pay

## 2021-04-12 ENCOUNTER — Ambulatory Visit: Payer: Medicare HMO | Admitting: Nurse Practitioner

## 2021-04-12 VITALS — BP 96/62 | HR 69 | Ht 67.5 in | Wt 106.6 lb

## 2021-04-12 DIAGNOSIS — R7989 Other specified abnormal findings of blood chemistry: Secondary | ICD-10-CM

## 2021-04-12 DIAGNOSIS — E0590 Thyrotoxicosis, unspecified without thyrotoxic crisis or storm: Secondary | ICD-10-CM | POA: Diagnosis not present

## 2021-04-12 NOTE — Patient Instructions (Signed)
Thyroid-Stimulating Hormone Test Why am I having this test? The thyroid is a gland in the lower front of the neck. It makes hormones that affect many body parts and systems, including the system that affects how quickly the body burns fuel for energy (metabolism). The pituitary gland is located just below the brain, behind the eyes and nasal passages. It helps maintain thyroid hormone levels and thyroid gland function. You may have a thyroid-stimulating hormone (TSH) test if you have possible symptoms of abnormal thyroid hormone levels. This test can help your health care provider: Diagnose a disorder of the thyroid gland or pituitary gland. Manage your condition and treatment if you have an underactive thyroid (hypothyroidism) or an overactive thyroid (hyperthyroidism). Newborn babies may have this test done to screen for hypothyroidism that is present at birth (congenital). What is being tested? This test measures the amount of TSH in your blood. TSH may also be called thyrotropin. When the thyroid does not make enough hormones, the pituitary gland releases TSH into the bloodstream to stimulate the thyroid gland to make more hormones. What kind of sample is taken?   A blood sample is required for this test. It is usually collected by inserting a needle into a blood vessel. For newborns, a small amount of blood may be collected from the umbilical cord, or by using a small needle to prick the baby's heel (heel stick). Tell a health care provider about: All medicines you are taking, including vitamins, herbs, eye drops, creams, and over-the-counter medicines. Any blood disorders you have. Any surgeries you have had. Any medical conditions you have. Whether you are pregnant or may be pregnant. How are the results reported? Your test results will be reported as a value that indicates how much TSH is in your blood. Your health care provider will compare your results to normal ranges that were  established after testing a large group of people (reference ranges). Reference ranges may vary among labs and hospitals. For this test, common reference ranges are: Adult: 2-10 microunits/mL or 2-10 milliunits/L. Newborn: Heel stick: 3-18 microunits/mL or 3-18 milliunits/L. Umbilical cord: 3-29 microunits/mL or 3-12 milliunits/L. What do the results mean? Results that are within the reference range are considered normal. This means that you have a normal amount of TSH in your blood. Results that are higher than the reference range mean that your TSH levels are too high. This may mean: Your thyroid gland is not making enough thyroid hormones. Your thyroid medicine dosage is too low. You have a tumor on your pituitary gland. This is rare. Results that are lower than the reference range mean that your TSH levels are too low. This may be caused by hyperthyroidism or by a problem with the pituitary gland function. Talk with your health care provider about what your results mean. Questions to ask your health care provider Ask your health care provider, or the department that is doing the test: When will my results be ready? How will I get my results? What are my treatment options? What other tests do I need? What are my next steps? Summary You may have a thyroid-stimulating hormone (TSH) test if you have possible symptoms of abnormal thyroid hormone levels. The thyroid is a gland in the lower front of the neck. It makes hormones that affect many body parts and systems. The pituitary gland is located just below the brain, behind the eyes and nasal passages. It helps maintain thyroid hormone levels and thyroid gland function. This test measures the  amount of TSH in your blood. TSH is made by the pituitary gland. It may also be called thyrotropin. This information is not intended to replace advice given to you by your health care provider. Make sure you discuss any questions you have with your  health care provider. Document Revised: 11/19/2020 Document Reviewed: 11/28/2019 Elsevier Patient Education  2022 [employer].

## 2021-04-12 NOTE — Progress Notes (Signed)
04/12/2021     Endocrinology Consult Note    Subjective:    Patient ID: Kristy Garza Kitchen, female    DOB: 01/06/1951, PCP Kristy Norlander, DO.   Past Medical History:  Diagnosis Date   Acid reflux    Allergy    Arthritis    ARTHRITIS IN NECK BY DR. Alroy Dust [facility]   Asthma    Interstitial cystitis    Osteoporosis    PONV (postoperative nausea and vomiting)    Tinnitus    Trigeminal neuralgia    Atypical trigeminal neuralgia    Past Surgical History:  Procedure Laterality Date   ABDOMINAL HYSTERECTOMY  2000   CYSTOSCOPY WITH HYDRODISTENSION AND BIOPSY N/A 06/11/2012   Procedure: CYSTOSCOPY/BIOPSY/HYDRODISTENSION with Instillation of Pyridium and Marcaine and Kenalog;  Surgeon: Ailene Rud, MD;  Location: [facility];  Service: Urology;  Laterality: N/A;  1 hour requested for this case  BLADDER BIOPSY   ETHMOIDECTOMY  2012   SEPTOPLASTY  1980's   vocal cord surgery   1990's   polyp removal    Social History   Socioeconomic History   Marital status: Married    Spouse name: Kristy Garza   Number of children: 3   Years of education: 12   Highest education level: Some college, no degree  Occupational History   Occupation: CNA    Comment: Retired  Tobacco Use   Smoking status: Never   Smokeless tobacco: Never  Vaping Use   Vaping Use: Never used  Substance and Sexual Activity   Alcohol use: No    Alcohol/week: 0.0 standard drinks    Comment: 01-19-2016 per pt no   Drug use: No    Comment: 01-19-2016 per pt no    Sexual activity: Not Currently    Birth control/protection: Surgical  Other Topics Concern   Not on file  Social History Narrative   Lives with husband.    Social Determinants of Health   Financial Resource Strain: Not on file  Food Insecurity: Not on file  Transportation Needs: Unmet Transportation Needs   Lack of Transportation (Medical): No   Lack of Transportation (Non-Medical): Yes  Physical Activity: Not on  file  Stress: Stress Concern Present   Feeling of Stress : To some extent  Social Connections: Not on file    Family History  Problem Relation Age of Onset   Hyperlipidemia Mother    Arthritis Mother    Cancer Mother    Lymphoma Mother    Cancer Father    Allergies Father    Allergies Sister    Allergies Sister    Allergic rhinitis Neg Hx    Angioedema Neg Hx    Asthma Neg Hx    Eczema Neg Hx    Immunodeficiency Neg Hx    Urticaria Neg Hx     Outpatient Encounter Medications as of 04/12/2021  Medication Sig   acetaminophen (TYLENOL) 500 MG tablet Take by mouth.   albuterol (PROVENTIL) (2.5 MG/3ML) 0.083% nebulizer solution Take 3 mLs (2.5 mg total) by nebulization every 4 (four) hours as needed for wheezing or shortness of breath (coughing fits).   albuterol (VENTOLIN HFA) 108 (90 Base) MCG/ACT inhaler USE 2 PUFFS EVERY 4 HOURS AS NEEDED FOR COUGH, WHEEZE, TIGHTNESS IN CHEST, OR SHORTNESS OF BREATH   ascorbic acid (VITAMIN C) 1000 MG tablet Take by mouth.   aspirin-acetaminophen-caffeine (EXCEDRIN MIGRAINE) 250-250-65 MG tablet Take by mouth.   b complex vitamins capsule Take 1 capsule by  mouth daily.   budesonide-formoterol (SYMBICORT) 80-4.5 MCG/ACT inhaler Inhale 2 puffs into the lungs in the morning and at bedtime. with spacer and rinse mouth afterwards.   calcium citrate-vitamin D (CITRACAL+D) 315-200 MG-UNIT tablet Take by mouth.   Cholecalciferol (VITAMIN D3) 125 MCG (5000 UT) CAPS Take 5,000 Units by mouth daily.   cycloSPORINE (RESTASIS) 0.05 % ophthalmic emulsion PLACE ONE DROP INTO BOTH EYES TWICE A DAY   Dermatological Products, Misc. (SUVICORT) EMUL    DHEA 25 MG CAPS Take by mouth.   fexofenadine (ALLEGRA) 60 MG tablet Take by mouth.   hyoscyamine (LEVSIN SL) 0.125 MG SL tablet Place under the tongue every 4 (four) hours as needed.   lidocaine (XYLOCAINE) 2 % jelly APPLY TOPICALLY TO AFFECTED AREA EVERY DAY AS NEEDED (use sparingly)   Lidocaine-Collagen-Aloe Vera  (REGENECARE) 2 % GEL Apply topically.   LORazepam (ATIVAN) 0.5 MG tablet Take 1 tablet (0.5 mg total) by mouth 2 (two) times daily as needed for anxiety.   magnesium oxide (MAG-OX) 400 MG tablet Take by mouth.   melatonin 5 MG TABS Take 5 mg by mouth.   mometasone (NASONEX) 50 MCG/ACT nasal spray PLACE 2 SPRAYS INTO THE NOSE DAILY.   pantoprazole (PROTONIX) 40 MG tablet Take 1 tablet (40 mg total) by mouth daily.   Plecanatide (TRULANCE) 3 MG TABS    Polyethylene Glycol 3350 (MIRALAX PO) Take by mouth as needed.   Probiotic Product (PROBIOTIC PO) Take by mouth daily.   Simethicone (GAS-X PO) Take by mouth.   tizanidine (ZANAFLEX) 2 MG capsule Take 1-2 capsules (2-4 mg total) by mouth 3 (three) times daily as needed for muscle spasms.   traMADol (ULTRAM) 50 MG tablet Take 1 tablet (50 mg total) by mouth every 12 (twelve) hours as needed.   UNABLE TO FIND Take 1 capsule by mouth daily. Dv3- vitamin D 3 plus immune support   [DISCONTINUED] benzonatate (TESSALON) 200 MG capsule Take 1 capsule (200 mg total) by mouth 3 (three) times daily as needed. (Patient not taking: Reported on 04/12/2021)   [DISCONTINUED] budesonide (PULMICORT) 0.5 MG/2ML nebulizer solution Take 2 mLs (0.5 mg total) by nebulization in the morning and at bedtime. (Patient not taking: Reported on 04/12/2021)   [DISCONTINUED] Cholecalciferol 25 MCG (1000 UT) tablet Take by mouth. (Patient not taking: Reported on 04/12/2021)   [DISCONTINUED] Cholecalciferol 50 MCG (2000 UT) CAPS Take 1 capsule by mouth daily. (Patient not taking: Reported on 04/12/2021)   [DISCONTINUED] Doxepin HCl 3 MG TABS Take 1 tablet (3 mg total) by mouth at bedtime as needed (sleep). (Patient not taking: Reported on 04/12/2021)   [DISCONTINUED] nystatin (MYCOSTATIN) 100000 UNIT/ML suspension Take 5 mLs (500,000 Units total) by mouth 4 (four) times daily. Swish and swallow (Patient not taking: Reported on 04/12/2021)   Facility-Administered Encounter Medications as of  04/12/2021  Medication   zoledronic acid (RECLAST) injection 5 mg    ALLERGIES: Allergies  Allergen Reactions   Cefuroxime Axetil Other (See Comments)    Extreme gas   Gabapentin Other (See Comments)    Constipation Constipation   Montelukast Other (See Comments)    Numbness and tingling in hand. Numbness and tingling in hand. Numbness and tingling in hand. NUMBNESS OF HANDS AND FEET. Numbness in hands and feet NUMBNESS OF HANDS AND FEET.   Pregabalin Other (See Comments)    Drowsiness, dry mouth Drowsiness, dry mouth Makes her tired and thirsty   Augmentin [Amoxicillin-Pot Clavulanate] Diarrhea   Avelox [Moxifloxacin Hcl In Nacl] Other (See  Comments)    Tremors    Biaxin [Clarithromycin]     Unsure of reaction   Cedax [Ceftibuten] Other (See Comments)    tremors   Ciprofloxacin Other (See Comments) and Nausea And Vomiting    Blurred vision Blurred vision Pt can't remember reaction   Clindamycin/Lincomycin     tachycardia   Levofloxacin Other (See Comments)    Feels dehydrated   Lincomycin Other (See Comments)    tachycardia   Montelukast Sodium Other (See Comments)    Numbness and tingling in hand. Numbness and tingling in hand.   Sulfonamide Derivatives Other (See Comments)    Gi upset   Sulfa Antibiotics Nausea Only    Other reaction(s): Other (See Comments) Gi upset  Other reaction(s): Other (See Comments) Gi upset Other reaction(s): Unknown    VACCINATION STATUS: Immunization History  Administered Date(s) Administered   Fluad Quad(high Dose 65+) 02/12/2021   Influenza,inj,Quad PF,6+ Mos 12/28/2012, 12/25/2013, 12/26/2014   PFIZER(Purple Top)SARS-COV-2 Vaccination 01/01/2020, 02/05/2020   Pneumococcal Conjugate-13 02/07/2013   Pneumococcal Polysaccharide-23 09/07/2017   Tdap 05/21/2010   Zoster, Live 03/27/2012     HPI  Kristy Garza is 71 y.o. female who presents today with a medical history as above. she is being seen in consultation for  hyperthyroidism requested by Kristy Norlander, DO.  she has been dealing with symptoms of anxiety for several months now but she reports she is under a great amount of stress as her mothers health isnt well and she will likely pass soon. 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   Upon reviewing her referral, she was noticed to have slightly suppressed TSH dating back as far as 45.  She has never had any imaging studies of her thyroid in the past.   she denies family history of thyroid dysfunction and denies family hx of thyroid cancer. she denies personal history of goiter. she is not on any anti-thyroid medications nor on any thyroid hormone supplements. Denies use of Biotin containing supplements.  she is willing to proceed with appropriate work up and therapy for thyrotoxicosis.   Review of systems  Constitutional: + Minimally fluctuating body weight, current Body mass index is 16.45 kg/m., no fatigue, no subjective hyperthermia, no subjective hypothermia Eyes: no blurry vision, no xerophthalmia ENT: no sore throat, no nodules palpated in throat, no dysphagia/odynophagia, no hoarseness Cardiovascular: no chest pain, no shortness of breath, no palpitations, no leg swelling Respiratory: no cough, no shortness of breath Gastrointestinal: no nausea/vomiting/diarrhea Musculoskeletal: no muscle/joint aches Skin: no rashes, no hyperemia Neurological: no tremors, no numbness, no tingling, no dizziness Psychiatric: no depression, + anxiety and related insomnia   Objective:    BP 96/62    Pulse 69    Ht 5' 7.5" (1.715 m)    Wt 106 lb 9.6 oz (48.4 kg)    SpO2 97%    BMI 16.45 kg/m   Wt Readings from Last 3 Encounters:  04/12/21 106 lb 9.6 oz (48.4 kg)  04/01/21 106 lb (48.1 kg)  03/26/21 106 lb (48.1 kg)     BP Readings from Last 3 Encounters:   04/12/21 96/62  04/01/21 116/76  03/26/21 109/73                          Physical Exam- Limited  Constitutional:  Body mass index  is 16.45 kg/m., not in acute distress, normal state of mind, + raspy voice Eyes:  EOMI, no exophthalmos Neck: Supple Thyroid: No gross goiter Cardiovascular: RRR, no murmurs, rubs, or gallops, no edema Respiratory: Adequate breathing efforts, no crackles, rales, rhonchi, or wheezing Musculoskeletal: no gross deformities, strength intact in all four extremities, no gross restriction of joint movements Skin:  no rashes, no hyperemia Neurological: slight tremor in right hand    CMP     Component Value Date/Time   NA 138 01/26/2021 0846   K 4.5 01/26/2021 0846   CL 102 01/26/2021 0846   CO2 26 01/26/2021 0846   GLUCOSE 91 01/26/2021 0846   GLUCOSE 95 03/25/2013 1052   BUN 10 01/26/2021 0846   CREATININE 0.72 01/26/2021 0846   CALCIUM 9.5 01/26/2021 0846   PROT 7.1 06/15/2020 0922   ALBUMIN 4.5 06/15/2020 0922   AST 25 06/15/2020 0922   ALT 16 06/15/2020 0922   ALKPHOS 79 06/15/2020 0922   BILITOT 0.3 06/15/2020 0922   GFRNONAA 84 11/18/2019 0914   GFRAA 96 11/18/2019 0914     CBC    Component Value Date/Time   WBC 5.5 03/15/2021 1647   WBC 4.9 06/16/2014 1338   WBC 5.9 10/05/2012 1228   RBC 3.61 (L) 03/15/2021 1647   RBC 3.82 (A) 06/16/2014 1338   RBC 4.07 10/05/2012 1228   RBC 4.07 10/05/2012 1228   HGB 11.6 03/15/2021 1647   HCT 33.7 (L) 03/15/2021 1647   PLT 214 03/15/2021 1647   MCV 93 03/15/2021 1647   MCH 32.1 03/15/2021 1647   MCH 30.3 06/16/2014 1338   MCH 29.7 10/05/2012 1228   MCHC 34.4 03/15/2021 1647   MCHC 32.4 06/16/2014 1338   MCHC 33.8 10/05/2012 1228   RDW 11.9 03/15/2021 1647   LYMPHSABS 0.8 03/15/2021 1647   MONOABS 1.0 06/23/2011 0110   EOSABS 0.1 03/15/2021 1647   BASOSABS 0.0 03/15/2021 1647     Diabetic Labs (most recent): Lab Results  Component Value Date   HGBA1C 5.1 06/15/2020    Lipid  Panel     Component Value Date/Time   CHOL 176 11/18/2019 0914   TRIG 47 11/18/2019 0914   HDL 70 11/18/2019 0914   CHOLHDL 2.5 11/18/2019 0914   LDLCALC 96 11/18/2019 0914   LDLDIRECT 99 02/14/2014 1047   LABVLDL 10 11/18/2019 0914     Lab Results  Component Value Date   TSH 0.440 (L) 01/26/2021   TSH 0.326 (L) 06/15/2020   TSH 0.467 11/18/2019   TSH 0.483 08/28/2018   TSH 0.474 06/12/2017   TSH 0.497 01/08/2016   TSH 0.377 (L) 04/08/2015   TSH 0.329 (L) 11/26/2014   TSH 0.402 (L) 08/13/2014   TSH 0.355 (L) 06/16/2014   FREET4 1.30 01/26/2021   FREET4 1.25 06/15/2020   FREET4 1.42 01/08/2016   FREET4 1.42 07/30/2013        Assessment & Plan:   1. Abnormal TSH  she is being seen at a kind request of Kristy Norlander, DO.  her history and most recent labs are reviewed, and she was examined clinically. She appears to have mildly suppressed TSH and normal Free T4 levels dating as far back as 2015.  Subjective and objective findings are NOT consistent with thyrotoxicosis from primary hyperthyroidism. However we did discuss the potential risks of untreated thyrotoxicosis.  More information is needed to identify the etiology of her thyroid condition, if any.     I will repeat full profile thyroid  function tests today, including antibody testing to rule out autoimmune thyroid dysfunction.  She does not need additional imaging studies such as uptake and scan or thyroid ultrasound at this time.   she will return in 2 weeks to discuss test results and treatment plan.    -Patient is advised to maintain close follow up with Kristy Norlander, DO for primary care needs.   - Time spent with the patient: 60 minutes, of which >50% was spent in obtaining information about her symptoms, reviewing her previous labs, evaluations, and treatments, counseling her about her hyperthyroidism , and developing a plan to confirm the diagnosis and long term treatment as necessary. Please  refer to "Patient Self Inventory" in the Media tab for reviewed elements of pertinent patient history.  Kristy Garza Kitchen participated in the discussions, expressed understanding, and voiced agreement with the above plans.  All questions were answered to her satisfaction. she is encouraged to contact clinic should she have any questions or concerns prior to her return visit.   Follow up plan: Return in about 2 weeks (around 04/26/2021) for Thyroid follow up, Previsit labs.   Thank you for involving me in the care of this pleasant patient, and I will continue to update you with her progress.    Rayetta Pigg, [facility] [address] [place], [place] [address] Phone: [phone] Fax: [phone]  04/12/2021, 2:43 PM

## 2021-04-13 LAB — T3, FREE: T3, Free: 2.4 pg/mL (ref 2.0–4.4)

## 2021-04-13 LAB — TSH: TSH: 0.53 u[IU]/mL (ref 0.450–4.500)

## 2021-04-13 LAB — T4, FREE: Free T4: 1.15 ng/dL (ref 0.82–1.77)

## 2021-04-13 LAB — THYROGLOBULIN ANTIBODY: Thyroglobulin Antibody: <1 IU/mL (ref 0.0–0.9)

## 2021-04-13 LAB — THYROID PEROXIDASE ANTIBODY: Thyroperoxidase Ab SerPl-aCnc: 11 IU/mL (ref 0–34)

## 2021-04-14 ENCOUNTER — Telehealth: Payer: Medicare HMO | Admitting: *Deleted

## 2021-04-22 ENCOUNTER — Other Ambulatory Visit: Payer: Self-pay | Admitting: Allergy

## 2021-04-26 ENCOUNTER — Encounter: Payer: Self-pay | Admitting: Nurse Practitioner

## 2021-04-26 ENCOUNTER — Ambulatory Visit: Payer: Medicare HMO | Admitting: Nurse Practitioner

## 2021-04-26 ENCOUNTER — Other Ambulatory Visit: Payer: Self-pay

## 2021-04-26 VITALS — BP 113/75 | HR 68 | Ht 67.5 in | Wt 110.0 lb

## 2021-04-26 DIAGNOSIS — R7989 Other specified abnormal findings of blood chemistry: Secondary | ICD-10-CM | POA: Diagnosis not present

## 2021-04-26 DIAGNOSIS — K909 Intestinal malabsorption, unspecified: Secondary | ICD-10-CM | POA: Diagnosis not present

## 2021-04-26 DIAGNOSIS — E0590 Thyrotoxicosis, unspecified without thyrotoxic crisis or storm: Secondary | ICD-10-CM

## 2021-04-26 NOTE — Progress Notes (Signed)
04/26/2021     Endocrinology Follow Up Note    Subjective:    Patient ID: Kristy Kitchen, female    DOB: [birth year], PCP [name], DO.   Past Medical History:  Diagnosis Date   Acid reflux    Allergy    Arthritis    ARTHRITIS IN NECK BY DR. Alroy Dust [facility]   Asthma    Interstitial cystitis    Osteoporosis    PONV (postoperative nausea and vomiting)    Tinnitus    Trigeminal neuralgia    Atypical trigeminal neuralgia    Past Surgical History:  Procedure Laterality Date   ABDOMINAL HYSTERECTOMY  2000   CYSTOSCOPY WITH HYDRODISTENSION AND BIOPSY N/A 06/11/2012   Procedure: CYSTOSCOPY/BIOPSY/HYDRODISTENSION with Instillation of Pyridium and Marcaine and Kenalog;  Surgeon: Ailene Rud, MD;  Location: [facility];  Service: Urology;  Laterality: N/A;  1 hour requested for this case  BLADDER BIOPSY   ETHMOIDECTOMY  2012   SEPTOPLASTY  1980's   vocal cord surgery   1990's   polyp removal    Social History   Socioeconomic History   Marital status: Married    Spouse name: [name]   Number of children: 3   Years of education: 12   Highest education level: Some college, no degree  Occupational History   Occupation: CNA    Comment: Retired  Tobacco Use   Smoking status: Never   Smokeless tobacco: Never  Vaping Use   Vaping Use: Never used  Substance and Sexual Activity   Alcohol use: No    Alcohol/week: 0Garza0 standard drinks    Comment: 01-19-2016 per pt no   Drug use: No    Comment: 01-19-2016 per pt no    Sexual activity: Not Currently    Birth control/protection: Surgical  Other Topics Concern   Not on file  Social History Narrative   Lives with husband.    Social Determinants of Health   Financial Resource Strain: Not on file  Food Insecurity: Not on file  Transportation Needs: Unmet Transportation Needs   Lack of Transportation (Medical): No   Lack of Transportation (Non-Medical): Yes  Physical Activity: Not  on file  Stress: Stress Concern Present   Feeling of Stress : To some extent  Social Connections: Not on file    Family History  Problem Relation Age of Onset   Hyperlipidemia Mother    Arthritis Mother    Cancer Mother    Lymphoma Mother    Cancer Father    Allergies Father    Allergies Sister    Allergies Sister    Allergic rhinitis Neg Hx    Angioedema Neg Hx    Asthma Neg Hx    Eczema Neg Hx    Immunodeficiency Neg Hx    Urticaria Neg Hx     Outpatient Encounter Medications as of 04/26/2021  Medication Sig   acetaminophen (TYLENOL) 500 MG tablet Take by mouth.   albuterol (PROVENTIL) (2Garza5 MG/3ML) 0Garza083% nebulizer solution Take 3 mLs (2Garza5 mg total) by nebulization every 4 (four) hours as needed for wheezing or shortness of breath (coughing fits).   albuterol (VENTOLIN HFA) 108 (90 Base) MCG/ACT inhaler USE 2 PUFFS EVERY 4 HOURS AS NEEDED FOR COUGH, WHEEZE, TIGHTNESS IN CHEST, OR SHORTNESS OF BREATH   ascorbic acid (VITAMIN C) 1000 MG tablet Take by mouth.   aspirin-acetaminophen-caffeine (EXCEDRIN MIGRAINE) 250-250-65 MG tablet Take by mouth.   Azelastine HCl 137 MCG/SPRAY SOLN Place into  both nostrils.   b complex vitamins capsule Take 1 capsule by mouth daily.   budesonide (PULMICORT) 0Garza5 MG/2ML nebulizer solution TAKE 2 MLS (0Garza5 MG TOTAL) BY NEBULIZATION IN THE MORNING AND AT BEDTIME.   budesonide-formoterol (SYMBICORT) 80-4Garza5 MCG/ACT inhaler Inhale 2 puffs into the lungs in the morning and at bedtime. with spacer and rinse mouth afterwards.   calcium citrate-vitamin D (CITRACAL+D) 315-200 MG-UNIT tablet Take by mouth.   Cholecalciferol (VITAMIN D3) 125 MCG (5000 UT) CAPS Take 5,000 Units by mouth daily.   cycloSPORINE (RESTASIS) 0Garza05 % ophthalmic emulsion PLACE ONE DROP INTO BOTH EYES TWICE A DAY   Dermatological Products, Misc. (SUVICORT) EMUL    DHEA 25 MG CAPS Take by mouth.   fexofenadine (ALLEGRA) 60 MG tablet Take by mouth.   hyoscyamine (LEVSIN SL) 0Garza125 MG SL  tablet Place under the tongue every 4 (four) hours as needed.   lidocaine (XYLOCAINE) 2 % jelly APPLY TOPICALLY TO AFFECTED AREA EVERY DAY AS NEEDED (use sparingly)   Lidocaine-Collagen-Aloe Vera (REGENECARE) 2 % GEL Apply topically.   LORazepam (ATIVAN) 0Garza5 MG tablet Take 1 tablet (0Garza5 mg total) by mouth 2 (two) times daily as needed for anxiety.   magnesium oxide (MAG-OX) 400 MG tablet Take by mouth.   melatonin 5 MG TABS Take 5 mg by mouth.   mometasone (NASONEX) 50 MCG/ACT nasal spray PLACE 2 SPRAYS INTO THE NOSE DAILY.   pantoprazole (PROTONIX) 40 MG tablet Take 1 tablet (40 mg total) by mouth daily.   Plecanatide (TRULANCE) 3 MG TABS    Polyethylene Glycol 3350 (MIRALAX PO) Take by mouth as needed.   Probiotic Product (PROBIOTIC PO) Take by mouth daily.   Simethicone (GAS-X PO) Take by mouth.   tizanidine (ZANAFLEX) 2 MG capsule Take 1-2 capsules (2-4 mg total) by mouth 3 (three) times daily as needed for muscle spasms.   traMADol (ULTRAM) 50 MG tablet Take 1 tablet (50 mg total) by mouth every 12 (twelve) hours as needed.   UNABLE TO FIND Take 1 capsule by mouth daily. Dv3- vitamin D 3 plus immune support   ipratropium (ATROVENT) 0Garza06 % nasal spray Place into both nostrils. (Patient not taking: Reported on 04/26/2021)   Facility-Administered Encounter Medications as of 04/26/2021  Medication   zoledronic acid (RECLAST) injection 5 mg    ALLERGIES: Allergies  Allergen Reactions   Cefuroxime Axetil Other (See Comments)    Extreme gas   Gabapentin Other (See Comments)    Constipation Constipation   Montelukast Other (See Comments)    Numbness and tingling in hand. Numbness and tingling in hand. Numbness and tingling in hand. NUMBNESS OF HANDS AND FEET. Numbness in hands and feet NUMBNESS OF HANDS AND FEET.   Pregabalin Other (See Comments)    Drowsiness, dry mouth Drowsiness, dry mouth Makes her tired and thirsty   Augmentin [Amoxicillin-Pot Clavulanate] Diarrhea   Avelox  [Moxifloxacin Hcl In Nacl] Other (See Comments)    Tremors    Biaxin [Clarithromycin]     Unsure of reaction   Cedax [Ceftibuten] Other (See Comments)    tremors   Ciprofloxacin Other (See Comments) and Nausea And Vomiting    Blurred vision Blurred vision Pt can't remember reaction   Clindamycin/Lincomycin     tachycardia   Levofloxacin Other (See Comments)    Feels dehydrated   Lincomycin Other (See Comments)    tachycardia   Montelukast Sodium Other (See Comments)    Numbness and tingling in hand. Numbness and tingling in hand.  Sulfonamide Derivatives Other (See Comments)    Gi upset   Sulfa Antibiotics Nausea Only    Other reaction(s): Other (See Comments) Gi upset  Other reaction(s): Other (See Comments) Gi upset Other reaction(s): Unknown    VACCINATION STATUS: Immunization History  Administered Date(s) Administered   Fluad Quad(high Dose 65+) 02/12/2021   Influenza,inj,Quad PF,6+ Mos 12/28/2012, 12/25/2013, 12/26/2014   PFIZER(Purple Top)SARS-COV-2 Vaccination 01/01/2020, 02/05/2020   Pneumococcal Conjugate-13 02/07/2013   Pneumococcal Polysaccharide-23 09/07/2017   Tdap 05/21/2010   Zoster, Live 03/27/2012     HPI  Kristy Garza is 71 yGarzao. female who presents today with a medical history as above. she is being seen in follow up after being seen in consultation for hyperthyroidism requested by [name], DO.  she has been dealing with symptoms of anxiety for several months now but she reports she is under a great amount of stress as her mothers health isnt well and she will likely pass soon. 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   Upon reviewing her referral, she was noticed to have slightly suppressed TSH dating back as far as 56.  She has never had any imaging studies of her thyroid in the past.   she  denies family history of thyroid dysfunction and denies family hx of thyroid cancer. she denies personal history of goiter. she is not on any anti-thyroid medications nor on any thyroid hormone supplements. Denies use of Biotin containing supplements.  she is willing to proceed with appropriate work up and therapy for thyrotoxicosis.   Review of systems  Constitutional: + Minimally fluctuating body weight, current Body mass index is 16Garza97 kg/m., no fatigue, no subjective hyperthermia, no subjective hypothermia Eyes: no blurry vision, no xerophthalmia ENT: no sore throat, no nodules palpated in throat, no dysphagia/odynophagia, + hoarseness- chronic Cardiovascular: no chest pain, no shortness of breath, no palpitations, no leg swelling Respiratory: no cough, no shortness of breath Gastrointestinal: no nausea/vomiting/diarrhea Musculoskeletal: no muscle/joint aches Skin: no rashes, no hyperemia Neurological: no tremors, no numbness, no tingling, no dizziness Psychiatric: no depression, + anxiety and related insomnia   Objective:    BP 113/75    Pulse 68    Ht 5' 7Garza5" (1Garza715 m)    Wt 110 lb (49Garza9 kg)    SpO2 100%    BMI 16Garza97 kg/m   Wt Readings from Last 3 Encounters:  04/26/21 110 lb (49Garza9 kg)  04/12/21 106 lb 9Garza6 oz (48Garza4 kg)  04/01/21 106 lb (48Garza1 kg)     BP Readings from Last 3 Encounters:  04/26/21 113/75  04/12/21 96/62  04/01/21 116/76                        Physical Exam- Limited  Constitutional:  Body mass index is 16Garza97 kg/m.-slightly malnourished appearance, not in acute distress, normal state of mind, + raspy voice-continued Eyes:  EOMI, no exophthalmos Neck: Supple Cardiovascular: RRR, no murmurs, rubs, or gallops, no edema Respiratory: Adequate breathing efforts, no crackles, rales, rhonchi, or wheezing Musculoskeletal: no gross deformities, strength intact in all four extremities, no gross restriction of joint movements Skin:  no rashes, no  hyperemia Neurological: slight tremor in right hand    CMP     Component Value Date/Time   NA 138 01/26/2021 0846   K 4Garza5 01/26/2021 0846   CL 102  01/26/2021 0846   CO2 26 01/26/2021 0846   GLUCOSE 91 01/26/2021 0846   GLUCOSE 95 03/25/2013 1052   BUN 10 01/26/2021 0846   CREATININE 0Garza72 01/26/2021 0846   CALCIUM 9Garza5 01/26/2021 0846   PROT 7Garza1 06/15/2020 0922   ALBUMIN 4Garza5 06/15/2020 0922   AST 25 06/15/2020 0922   ALT 16 06/15/2020 0922   ALKPHOS 79 06/15/2020 0922   BILITOT 0Garza3 06/15/2020 0922   GFRNONAA 84 11/18/2019 0914   GFRAA 96 11/18/2019 0914     CBC    Component Value Date/Time   WBC 5Garza5 03/15/2021 1647   WBC 4Garza9 06/16/2014 1338   WBC 5Garza9 10/05/2012 1228   RBC 3Garza61 (L) 03/15/2021 1647   RBC 3Garza82 (A) 06/16/2014 1338   RBC 4Garza07 10/05/2012 1228   RBC 4Garza07 10/05/2012 1228   HGB 11Garza6 03/15/2021 1647   HCT 33Garza7 (L) 03/15/2021 1647   PLT 214 03/15/2021 1647   MCV 93 03/15/2021 1647   MCH 32Garza1 03/15/2021 1647   MCH 30Garza3 06/16/2014 1338   MCH 29Garza7 10/05/2012 1228   MCHC 34Garza4 03/15/2021 1647   MCHC 32Garza4 06/16/2014 1338   MCHC 33Garza8 10/05/2012 1228   RDW 11Garza9 03/15/2021 1647   LYMPHSABS 0Garza8 03/15/2021 1647   MONOABS 1Garza0 06/23/2011 0110   EOSABS 0Garza1 03/15/2021 1647   BASOSABS 0Garza0 03/15/2021 1647     Diabetic Labs (most recent): Lab Results  Component Value Date   HGBA1C 5Garza1 06/15/2020    Lipid Panel     Component Value Date/Time   CHOL 176 11/18/2019 0914   TRIG 47 11/18/2019 0914   HDL 70 11/18/2019 0914   CHOLHDL 2Garza5 11/18/2019 0914   LDLCALC 96 11/18/2019 0914   LDLDIRECT 99 02/14/2014 1047   LABVLDL 10 11/18/2019 0914     Lab Results  Component Value Date   TSH 0Garza530 04/12/2021   TSH 0Garza440 (L) 01/26/2021   TSH 0Garza326 (L) 06/15/2020   TSH 0Garza467 11/18/2019   TSH 0Garza483 08/28/2018   TSH 0Garza474 06/12/2017   TSH 0Garza497 01/08/2016   TSH 0Garza377 (L) 04/08/2015   TSH 0Garza329 (L) 11/26/2014   TSH 0Garza402 (L) 08/13/2014   FREET4 1Garza15 04/12/2021    FREET4 1Garza30 01/26/2021   FREET4 1Garza25 06/15/2020   FREET4 1Garza42 01/08/2016   FREET4 1Garza42 07/30/2013      Latest Reference Range & Units 08/28/18 10:54 11/18/19 09:14 06/15/20 09:22 01/26/21 08:46 04/12/21 14:30  TSH 0Garza450 - 4Garza500 uIU/mL 0Garza483 0Garza467 0Garza326 (L) 0Garza440 (L) 0Garza530  Triiodothyronine,Free,Serum 2Garza0 - 4Garza4 pg/mL     2Garza4  T4,Free(Direct) 0Garza82 - 1Garza77 ng/dL   1Garza25 1Garza30 1Garza15  Thyroperoxidase Ab SerPl-aCnc 0 - 34 IU/mL     11  Thyroglobulin Antibody 0Garza0 - 0Garza9 IU/mL     <1Garza0  (L): Data is abnormally low   Assessment & Plan:   1. Abnormal TSH  she is being seen at a kind request of [name], DO.  Her repeat thyroid function tests are WNL, her antibody testing rules out autoimmune thyroid dysfunction.  -There is no need for thyroid imaging at this time.  Will plan to repeat thyroid function tests in 6 months for surveillance purposes.    -Patient is advised to maintain close follow up with [name], DO for primary care needs. She could benefit from seeing nutritionist for malabsorption syndrome given her small size and complicated GI history.  I did enter order for this today.     I spent 30 minutes  in the care of the patient today including review of labs from Thyroid Function, CMP, and other relevant labs ; imaging/biopsy records (current and previous including abstractions from other facilities); face-to-face time discussing  her lab results and symptoms, medications doses, her options of short and long term treatment based on the latest standards of care / guidelines;   and documenting the encounter.  Kristy Kitchen  participated in the discussions, expressed understanding, and voiced agreement with the above plans.  All questions were answered to her satisfaction. she is encouraged to contact clinic should she have any questions or concerns prior to her return visit.   Follow up plan: Return in about 6 months (around 10/24/2021) for Thyroid follow up,  Previsit labs.   Thank you for involving me in the care of this pleasant patient, and I will continue to update you with her progress.    Rayetta Pigg, [facility] [address] [place], [place] [address] Phone: [phone] Fax: [phone]  04/26/2021, 3:25 PM

## 2021-04-28 ENCOUNTER — Other Ambulatory Visit: Payer: Self-pay | Admitting: Allergy

## 2021-04-28 DIAGNOSIS — M9903 Segmental and somatic dysfunction of lumbar region: Secondary | ICD-10-CM | POA: Diagnosis not present

## 2021-04-28 DIAGNOSIS — M9902 Segmental and somatic dysfunction of thoracic region: Secondary | ICD-10-CM | POA: Diagnosis not present

## 2021-04-28 DIAGNOSIS — M5137 Other intervertebral disc degeneration, lumbosacral region: Secondary | ICD-10-CM | POA: Diagnosis not present

## 2021-04-28 DIAGNOSIS — M9901 Segmental and somatic dysfunction of cervical region: Secondary | ICD-10-CM | POA: Diagnosis not present

## 2021-05-03 ENCOUNTER — Ambulatory Visit (INDEPENDENT_AMBULATORY_CARE_PROVIDER_SITE_OTHER): Payer: Medicare HMO

## 2021-05-03 DIAGNOSIS — M810 Age-related osteoporosis without current pathological fracture: Secondary | ICD-10-CM

## 2021-05-03 DIAGNOSIS — J4520 Mild intermittent asthma, uncomplicated: Secondary | ICD-10-CM

## 2021-05-03 DIAGNOSIS — F411 Generalized anxiety disorder: Secondary | ICD-10-CM

## 2021-05-03 DIAGNOSIS — K581 Irritable bowel syndrome with constipation: Secondary | ICD-10-CM

## 2021-05-03 DIAGNOSIS — K219 Gastro-esophageal reflux disease without esophagitis: Secondary | ICD-10-CM

## 2021-05-03 DIAGNOSIS — R519 Headache, unspecified: Secondary | ICD-10-CM

## 2021-05-03 NOTE — Patient Instructions (Addendum)
Visit Information  Patient Goals: Protect My Health (Patient).  Manage Depression issues.  Manage anxiety issues. Manage Grief issues  Timeframe:  Short-Term Goal Priority:  Medium Progress: On Track Start Date:        05/03/21                Expected End Date:        07/29/21            Follow Up Date   06/23/21 at 10:00 AM   Protect My Health (Patient)  Manage depression issues; Manage Anxiety issues . Manage grief issues   Why is this important?   Screening tests can find diseases early when they are easier to treat.  Your doctor or nurse will talk with you about which tests are important for you.  Getting shots for common diseases like the flu and shingles will help prevent them.     Patient Self Care Activities:  Self administers medications as prescribed Attends all scheduled provider appointments Performs ADL's independently  Patient Coping Strengths:  Family Friends  Patient Self Care Deficits:  Depression issues Anxiety issues  Patient Goals:  - spend time or talk with others at least 2 to 3 times per week - practice relaxation or meditation daily - keep a calendar with appointment dates  Follow Up Plan: LCSW to call client on 06/23/21 at 10:00 to assess client needs.    [name].Sahithi Ordoyne MSW, [place] [employer] representative [facility] Care Management [phone]

## 2021-05-03 NOTE — Chronic Care Management (AMB) (Signed)
Chronic Care Management    Clinical Social Work Note  05/03/2021 Name: Kristy Garza MRN: [id number] DOB: September 22, 1950  Kristy Garza is a 71 y.o. year old female who is a primary care patient of [name], DO. The CCM team was consulted to assist the patient with chronic disease management and/or care coordination needs related to: [employer] .   Engaged with patient by telephone for follow up visit in response to provider referral for social work chronic care management and care coordination services.   Consent to Services:  The patient was given information about Chronic Care Management services, agreed to services, and gave verbal consent prior to initiation of services.  Please see initial visit note for detailed documentation.   Patient agreed to services and consent obtained.   Assessment: Review of patient past medical history, allergies, medications, and health status, including review of relevant consultants reports was performed today as part of a comprehensive evaluation and provision of chronic care management and care coordination services.     SDOH (Social Determinants of Health) assessments and interventions performed:  SDOH Interventions    Flowsheet Row Most Recent Value  SDOH Interventions   Physical Activity Interventions Other (Comments)  [fatigues occasionally when walking]  Stress Interventions Provide Counseling  [client has stress relate to managing medical needs. client is experiencing grief related to recent death of her mother]  Depression Interventions/Treatment  Counseling        Advanced Directives Status: See [employer] application for related entries.  CCM Care Plan  Allergies  Allergen Reactions   Cefuroxime Axetil Other (See Comments)    Extreme gas   Gabapentin Other (See Comments)    Constipation Constipation   Montelukast Other (See Comments)    Numbness and tingling in hand. Numbness and tingling in hand. Numbness and  tingling in hand. NUMBNESS OF HANDS AND FEET. Numbness in hands and feet NUMBNESS OF HANDS AND FEET.   Pregabalin Other (See Comments)    Drowsiness, dry mouth Drowsiness, dry mouth Makes her tired and thirsty   Augmentin [Amoxicillin-Pot Clavulanate] Diarrhea   Avelox [Moxifloxacin Hcl In Nacl] Other (See Comments)    Tremors    Biaxin [Clarithromycin]     Unsure of reaction   Cedax [Ceftibuten] Other (See Comments)    tremors   Ciprofloxacin Other (See Comments) and Nausea And Vomiting    Blurred vision Blurred vision Pt can't remember reaction   Clindamycin/Lincomycin     tachycardia   Levofloxacin Other (See Comments)    Feels dehydrated   Lincomycin Other (See Comments)    tachycardia   Montelukast Sodium Other (See Comments)    Numbness and tingling in hand. Numbness and tingling in hand.   Sulfonamide Derivatives Other (See Comments)    Gi upset   Sulfa Antibiotics Nausea Only    Other reaction(s): Other (See Comments) Gi upset  Other reaction(s): Other (See Comments) Gi upset Other reaction(s): Unknown    Outpatient Encounter Medications as of 05/03/2021  Medication Sig Note   acetaminophen (TYLENOL) 500 MG tablet Take by mouth. 01/07/2016: Received from: [facility] Received Sig: Take by mouth.   albuterol (PROVENTIL) (2.5 MG/3ML) 0.083% nebulizer solution Take 3 mLs (2.5 mg total) by nebulization every 4 (four) hours as needed for wheezing or shortness of breath (coughing fits).    albuterol (VENTOLIN HFA) 108 (90 Base) MCG/ACT inhaler USE 2 PUFFS EVERY 4 HOURS AS NEEDED FOR COUGH, WHEEZE, TIGHTNESS IN CHEST, OR SHORTNESS OF BREATH  ascorbic acid (VITAMIN C) 1000 MG tablet Take by mouth.    aspirin-acetaminophen-caffeine (EXCEDRIN MIGRAINE) 250-250-65 MG tablet Take by mouth.    Azelastine HCl 137 MCG/SPRAY SOLN Place into both nostrils.    b complex vitamins capsule Take 1 capsule by mouth daily.    budesonide (PULMICORT) 0.5 MG/2ML  nebulizer solution USE 1 VIAL VIA NEBULIZER IN THE MORNING AND AT BEDTIME    budesonide-formoterol (SYMBICORT) 80-4.5 MCG/ACT inhaler Inhale 2 puffs into the lungs in the morning and at bedtime. with spacer and rinse mouth afterwards.    calcium citrate-vitamin D (CITRACAL+D) 315-200 MG-UNIT tablet Take by mouth.    Cholecalciferol (VITAMIN D3) 125 MCG (5000 UT) CAPS Take 5,000 Units by mouth daily.    cycloSPORINE (RESTASIS) 0.05 % ophthalmic emulsion PLACE ONE DROP INTO BOTH EYES TWICE A DAY    Dermatological Products, Misc. (SUVICORT) EMUL     DHEA 25 MG CAPS Take by mouth.    fexofenadine (ALLEGRA) 60 MG tablet Take by mouth.    hyoscyamine (LEVSIN SL) 0.125 MG SL tablet Place under the tongue every 4 (four) hours as needed.    ipratropium (ATROVENT) 0.06 % nasal spray Place into both nostrils. (Patient not taking: Reported on 04/26/2021)    lidocaine (XYLOCAINE) 2 % jelly APPLY TOPICALLY TO AFFECTED AREA EVERY DAY AS NEEDED (use sparingly)    Lidocaine-Collagen-Aloe Vera (REGENECARE) 2 % GEL Apply topically.    LORazepam (ATIVAN) 0.5 MG tablet Take 1 tablet (0.5 mg total) by mouth 2 (two) times daily as needed for anxiety.    magnesium oxide (MAG-OX) 400 MG tablet Take by mouth.    melatonin 5 MG TABS Take 5 mg by mouth.    mometasone (NASONEX) 50 MCG/ACT nasal spray PLACE 2 SPRAYS INTO THE NOSE DAILY.    pantoprazole (PROTONIX) 40 MG tablet Take 1 tablet (40 mg total) by mouth daily.    Plecanatide (TRULANCE) 3 MG TABS     Polyethylene Glycol 3350 (MIRALAX PO) Take by mouth as needed.    Probiotic Product (PROBIOTIC PO) Take by mouth daily.    Simethicone (GAS-X PO) Take by mouth.    tizanidine (ZANAFLEX) 2 MG capsule Take 1-2 capsules (2-4 mg total) by mouth 3 (three) times daily as needed for muscle spasms.    traMADol (ULTRAM) 50 MG tablet Take 1 tablet (50 mg total) by mouth every 12 (twelve) hours as needed.    UNABLE TO FIND Take 1 capsule by mouth daily. Dv3- vitamin D 3 plus  immune support    Facility-Administered Encounter Medications as of 05/03/2021  Medication   zoledronic acid (RECLAST) injection 5 mg    Patient Active Problem List   Diagnosis Date Noted   Recurrent infections 03/15/2021   Upper respiratory tract infection 01/05/2021   Not well controlled moderate persistent asthma 06/16/2020   Raynaud phenomenon 06/16/2020   Tear film insufficiency 06/16/2020   Unspecified behavioral syndromes associated with physiological disturbances and physical factors 06/16/2020   Inflammatory arthritis 06/16/2020   Iron deficiency anemia, unspecified 04/17/2020   Age-related osteoporosis without current pathological fracture 01/04/2019   History of adenomatous polyp of colon 07/02/2018   Malnutrition of moderate degree ([place]) 05/23/2018   Adult BMI <19 kg/sq m 04/13/2018   Incontinence of feces 04/12/2018   Pharyngitis 01/09/2018   Anxiety state 01/19/2016   Adjustment disorder with anxious mood 01/19/2016   HLD (hyperlipidemia) 01/07/2016   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   Chronic constipation 06/22/2015   Chronic bronchitis ([place]) 05/07/2015  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   Trigeminal neuralgia 04/17/2014   Face pain 01/22/2014   Bruxism, sleep-related 08/28/2013   Asthma, chronic 05/13/2013   Generalized anxiety disorder 05/13/2013   Nonallergic rhinitis 05/13/2013   Difficulty speaking 04/11/2013   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   Leg cramps 03/23/2013   Gastroesophageal reflux disease 03/11/2013   Osteoporosis 01/30/2013   Abdominal pain 01/16/2013   Back ache 01/16/2013   Chronic interstitial cystitis 01/16/2013   FOM (frequency of micturition) 01/16/2013   Seasonal allergic rhinitis 07/07/2012   G E R D 08/15/2007   Cough 08/15/2007   VOCAL CORD POLYP, HX OF 08/15/2007    Conditions to be addressed/monitored: monitor client management of grief issues and depression issues  Care Plan : LCSW Care Plan   Updates made by Katha Cabal, LCSW since 05/03/2021 12:00 AM     Problem: Emotional Distress      Goal: Manage depression issues; Manage anxiety issues. Manage Grief issues   Start Date: 05/03/2021  Expected End Date: 07/29/2021  This Visit's Progress: On track  Recent Progress: On track  Priority: Medium  Note:   Current Barriers:  Chronic Mental Health needs related to depression and anxiety Challenges with recovery from COVID-19 Suicidal Ideation/Homicidal Ideation: No Grief issues faced  Clinical Social Work Goal(s):  patient will work with SW monthly by telephone or in person to reduce or manage symptoms related to depression and anxiety patient will work with SW monthly to address concerns related to grief issues of client  Patient will communicate with RNCM in next 30 days to discuss nursing needs of client  Interventions: 1:1 collaboration with [name], DO regarding development and update of comprehensive plan of care as evidenced by provider attestation and co-signature Discussed transport needs of client. LCSW discussed [facility] services with client. LCSW gave client the phone number for [facility]. Client said she also has family support to help her with transport needs Provided counseling support for client Discussed family support. She said she has support from her spouse and from her sister Reviewed ambulation of client. Zareth said she was walking well. Reviewed grief management of client. Her mother passed away in Apr 14, 2021.  She said adjusting to death of her mother has been difficult. She has several sisters and they are trying to support one another during this time of grief. Reviewed medication procurement of client Reviewed sleeping issues of client and reviewed pain issues of client . Client said she is not sleeping well Encouraged client to communicate with [facility] regarding nursing needs of client Reviewed energy level of client. Perri said she has  reduced energy. She said she and her sisters are trying to organize home of her mother at this time.  She said her mother passed away on 04-16-21. Provided grief counseling support. Encouraged client to talk with chaplain or counselor at [facility] to further discuss grief management of client. Client has said that [facility] representative offered to link client to grief support help. Encouraged client to call LCSW as needed for SW support  Patient Self Care Activities:  Self administers medications as prescribed Attends all scheduled provider appointments Performs ADL's independently  Patient Coping Strengths:  Family Friends  Patient Self Care Deficits:  Depression issues Anxiety issues  Patient Goals:  - spend time or talk with others at least 2 to 3 times per week - practice relaxation or meditation daily - keep a calendar with appointment dates  Follow Up Plan: LCSW  to call client on 06/23/21 at 10:00 to assess client needs     [name].Alinda Egolf MSW, [facility] representative [facility] Care Management [phone]

## 2021-05-05 DIAGNOSIS — M9901 Segmental and somatic dysfunction of cervical region: Secondary | ICD-10-CM | POA: Diagnosis not present

## 2021-05-05 DIAGNOSIS — M9903 Segmental and somatic dysfunction of lumbar region: Secondary | ICD-10-CM | POA: Diagnosis not present

## 2021-05-05 DIAGNOSIS — M9902 Segmental and somatic dysfunction of thoracic region: Secondary | ICD-10-CM | POA: Diagnosis not present

## 2021-05-05 DIAGNOSIS — M5137 Other intervertebral disc degeneration, lumbosacral region: Secondary | ICD-10-CM | POA: Diagnosis not present

## 2021-05-12 DIAGNOSIS — R636 Underweight: Secondary | ICD-10-CM | POA: Diagnosis not present

## 2021-05-12 DIAGNOSIS — Z862 Personal history of diseases of the blood and blood-forming organs and certain disorders involving the immune mechanism: Secondary | ICD-10-CM | POA: Diagnosis not present

## 2021-05-12 DIAGNOSIS — D509 Iron deficiency anemia, unspecified: Secondary | ICD-10-CM | POA: Diagnosis not present

## 2021-05-12 DIAGNOSIS — K5904 Chronic idiopathic constipation: Secondary | ICD-10-CM | POA: Diagnosis not present

## 2021-05-12 DIAGNOSIS — K5730 Diverticulosis of large intestine without perforation or abscess without bleeding: Secondary | ICD-10-CM | POA: Diagnosis not present

## 2021-05-12 DIAGNOSIS — Z86010 Personal history of colonic polyps: Secondary | ICD-10-CM | POA: Diagnosis not present

## 2021-05-12 DIAGNOSIS — R140 Abdominal distension (gaseous): Secondary | ICD-10-CM | POA: Diagnosis not present

## 2021-05-12 DIAGNOSIS — Z8639 Personal history of other endocrine, nutritional and metabolic disease: Secondary | ICD-10-CM | POA: Diagnosis not present

## 2021-05-18 ENCOUNTER — Ambulatory Visit: Payer: Medicare HMO | Admitting: Allergy

## 2021-05-20 ENCOUNTER — Encounter: Payer: Self-pay | Admitting: Family Medicine

## 2021-05-20 ENCOUNTER — Telehealth: Payer: Self-pay | Admitting: Family Medicine

## 2021-05-20 ENCOUNTER — Ambulatory Visit (INDEPENDENT_AMBULATORY_CARE_PROVIDER_SITE_OTHER): Payer: Medicare HMO | Admitting: Family Medicine

## 2021-05-20 DIAGNOSIS — N3090 Cystitis, unspecified without hematuria: Secondary | ICD-10-CM

## 2021-05-20 DIAGNOSIS — R3989 Other symptoms and signs involving the genitourinary system: Secondary | ICD-10-CM

## 2021-05-20 LAB — URINALYSIS
Bilirubin, UA: NEGATIVE
Glucose, UA: NEGATIVE
Ketones, UA: NEGATIVE
Nitrite, UA: NEGATIVE
Protein,UA: NEGATIVE
Specific Gravity, UA: 1.01 (ref 1.005–1.030)
Urobilinogen, Ur: 0.2 mg/dL (ref 0.2–1.0)
pH, UA: 7.5 (ref 5.0–7.5)

## 2021-05-20 MED ORDER — FOSFOMYCIN TROMETHAMINE 3 G PO PACK: 3.0000 g | PACK | Freq: Once | ORAL | 0 refills | Status: AC

## 2021-05-20 NOTE — Progress Notes (Addendum)
Subjective:    Patient ID: Kristy Garza, female    DOB: 02/04/1951, 71 y.o.   MRN: [id number]   HPI: Kristy Garza is a 71 y.o. female presenting for burning with urination  for several days. Denies fever . No flank pain. No nausea, vomiting. Has had headaches this week.     Depression screen [facility] 2/9 05/03/2021 04/01/2021 03/26/2021 03/08/2021 03/05/2021  Decreased Interest 1 0 0 1 1  Down, Depressed, Hopeless 1 0 0 1 1  PHQ - 2 Score 2 0 0 2 2  Altered sleeping 1 0 0 1 1  Tired, decreased energy 1 1 1 2 1   Change in appetite 1 0 0 0 0  Feeling bad or failure about yourself  1 0 0 0 0  Trouble concentrating 1 0 0 0 0  Moving slowly or fidgety/restless 1 0 0 1 0  Suicidal thoughts 0 0 0 0 0  PHQ-9 Score 8 1 1 6 4   Difficult doing work/chores Somewhat difficult - Not difficult at all Somewhat difficult -  Some recent data might be hidden     Relevant past medical, surgical, family and social history reviewed and updated as indicated.  Interim medical history since our last visit reviewed. Allergies and medications reviewed and updated.  ROS:  Review of Systems  Constitutional:  Negative for chills, diaphoresis and fever.  HENT:  Negative for congestion.   Eyes:  Negative for visual disturbance.  Respiratory:  Negative for cough and shortness of breath.   Cardiovascular:  Negative for chest pain and palpitations.  Gastrointestinal:  Negative for constipation, diarrhea and nausea.  Genitourinary:  Positive for dysuria and urgency. Negative for decreased urine volume, flank pain, frequency, hematuria, menstrual problem and pelvic pain.  Musculoskeletal:  Negative for arthralgias and joint swelling.  Skin:  Negative for rash.  Neurological:  Negative for dizziness and numbness.    Social History   Tobacco Use  Smoking Status Never  Smokeless Tobacco Never       Objective:     Wt Readings from Last 3 Encounters:  04/26/21 110 lb (49.9 kg)  04/12/21 106 lb 9.6 oz  (48.4 kg)  04/01/21 106 lb (48.1 kg)     Exam deferred. Pt. Harboring due to COVID 19. Phone visit performed.   Assessment & Plan:   1. Cystitis   2. Suspected UTI     Meds ordered this encounter  Medications   fosfomycin (MONUROL) 3 g PACK    Sig: Take 3 g by mouth once for 1 dose.    Dispense:  3 g    Refill:  0    Orders Placed This Encounter  Procedures   Urine Culture   Urinalysis      Diagnoses and all orders for this visit:  Cystitis -     Urinalysis -     Urine Culture  Suspected UTI -     fosfomycin (MONUROL) 3 g PACK; Take 3 g by mouth once for 1 dose.    Virtual Visit via telephone Note  I discussed the limitations, risks, security and privacy concerns of performing an evaluation and management service by telephone and the availability of in person appointments. The patient was identified with two identifiers. Pt.expressed understanding and agreed to proceed. Pt. Is at home. Dr. Livia Snellen is in his office.  Follow Up Instructions:   I discussed the assessment and treatment plan with the patient. The patient was provided an opportunity to ask questions  and all were answered. The patient agreed with the plan and demonstrated an understanding of the instructions.   The patient was advised to call back or seek an in-person evaluation if the symptoms worsen or if the condition fails to improve as anticipated.   Total minutes including chart review and phone contact time: 7   Follow up plan: Return if symptoms worsen or fail to improve.  [name], MD [place]

## 2021-05-20 NOTE — Telephone Encounter (Signed)
Please call patients cell phone with urine results

## 2021-05-23 LAB — URINE CULTURE

## 2021-05-24 ENCOUNTER — Other Ambulatory Visit: Payer: Self-pay | Admitting: Family Medicine

## 2021-05-25 ENCOUNTER — Other Ambulatory Visit: Payer: Self-pay | Admitting: *Deleted

## 2021-05-25 ENCOUNTER — Telehealth: Payer: Self-pay | Admitting: Family Medicine

## 2021-05-25 ENCOUNTER — Other Ambulatory Visit: Payer: Medicare HMO

## 2021-05-25 DIAGNOSIS — R300 Dysuria: Secondary | ICD-10-CM | POA: Diagnosis not present

## 2021-05-25 LAB — URINALYSIS, COMPLETE
Bilirubin, UA: NEGATIVE
Glucose, UA: NEGATIVE
Ketones, UA: NEGATIVE
Leukocytes,UA: NEGATIVE
Nitrite, UA: NEGATIVE
Protein,UA: NEGATIVE
RBC, UA: NEGATIVE
Specific Gravity, UA: <1.005 — ABNORMAL LOW (ref 1.005–1.030)
Urobilinogen, Ur: 0.2 mg/dL (ref 0.2–1.0)
pH, UA: 6.5 (ref 5.0–7.5)

## 2021-05-25 LAB — MICROSCOPIC EXAMINATION
Bacteria, UA: NONE SEEN
Epithelial Cells (non renal): NONE SEEN /hpf (ref 0–10)
RBC, Urine: NONE SEEN /hpf (ref 0–2)
Renal Epithel, UA: NONE SEEN /hpf
WBC, UA: NONE SEEN /hpf (ref 0–5)

## 2021-05-25 NOTE — Telephone Encounter (Signed)
Spoke with patient and advised per Dr Livia Snellen to come leave urine sample. Patient agreeable

## 2021-05-25 NOTE — Telephone Encounter (Signed)
Pt contacted. Pt aware lab results.

## 2021-05-25 NOTE — Telephone Encounter (Signed)
Pt called stating that she had a televisit with Dr Livia Snellen last week for UTI and was prescribed some medicine to take for it. Pt says she is still having symptoms and wants to know if she can have her urine rechecked to see if infection is still present.

## 2021-05-26 NOTE — Progress Notes (Signed)
Follow Up Note  RE: Kristy Garza MRN: [id number] DOB: 01/31/1951 Date of Office Visit: 05/27/2021  Referring provider: [name], DO Primary care provider: [name], DO  Chief Complaint: Allergy Testing  History of Present Illness: I had the pleasure of seeing Kristy Garza for a follow up visit at the [facility] on 05/27/2021. She is a 71 y.o. female, who is being followed for asthma, recurrent infections, nonallergic rhinitis and GERD. Her previous allergy office visit was on 03/15/2021 with Dr. Maudie Mercury. Today is a skin testing and follow up visit.  Asthma:  Currently taking Symbicort 39mcg 1 puff twice a day. 2 puffs made her jittery.  Using albuterol in the mornings twice per week due to some chest congestion and coughing.  She thinks the Symbicort is working pretty well for her.  No nebulizer machine lately.  Denies any ER/urgent care visits or prednisone use since the last visit.  Recurrent infections No infections/antibiotics use.    Nonallergic rhinitis Patient had some type of bloodwork drawn by an integrative physician. This apparently showed allergy to trees and mold - records not available for now.  Still having some PND, coughing, nasal congestion.  Using saline rinse, Vick's.  Used azelastine and Nasonex/Flonase on and off with some benefit. Atrovent nasal spray seemed to work better though.  Benadryl caused dry eyes.    Gastroesophageal reflux disease Stopped taking pantoprazole for a few months now. Denies any worsening symptoms.  Assessment and Plan: Kristy Garza is a 71 y.o. female with: Nonallergic rhinitis Past history - 2022 bloodwork negative to environmental allergy panel. ENT evaluation unremarkable (patient declined laryngoscopy) Interim history - still having symptoms and wants to get allergy skin testing done today.  Today's skin testing showed: Negative to indoor/outdoor allergies and common foods.  Discussed  with patient that she can have these symptoms and not have environmental allergies.  Use Nasacort (triamcinolone) or Nasonex nasal spray 1 spray per nostril twice a day as needed for nasal congestion.  Use Atrovent (ipratropium) 0.63% 1-2 sprays per nostril three a day as needed for runny nose/drainage. Nasal saline spray (i.e., Simply Saline) or nasal saline lavage (i.e., NeilMed) is recommended as needed and prior to medicated nasal sprays.  Moderate persistent asthma without complication Past history - Breo caused thrush. 2022 spirometry showed: normal pattern with 7% improvement in FEV1 post bronchodilator treatment. Clinically feeling much better. Covid-19 in Nov 2022. Interim history - doing better with Symbicort but can only tolerate 1 puff BID. No additional prednisone since the last visit. Some coughing in the mornings and uses albuterol twice per week with good benefit. No recent neb use. Today's spirometry was normal.  Daily controller medication(s): Symbicort 22mcg 1 puff twice a day with spacer and rinse mouth afterwards. During upper respiratory infections/asthma flares:  Start budesonide 0.5mg  nebulizer twice a day for 1-2 weeks until your breathing symptoms return to baseline.  Pretreat with albuterol 2 puffs or albuterol nebulizer.  If you need to use your albuterol nebulizer machine back to back within 15-30 minutes with no relief then please go to the ER/urgent care for further evaluation.  May use albuterol rescue inhaler 2 puffs or nebulizer every 4 to 6 hours as needed for shortness of breath, chest tightness, coughing, and wheezing. May use albuterol rescue inhaler 2 puffs 5 to 15 minutes prior to strenuous physical activities. Monitor frequency of use.  Get spirometry at next visit.  Gastroesophageal reflux disease Stopped PPI with  no worsening symptoms.  Continue dietary and lifestyle modifications as listed below Monitor symptoms.   Recurrent infections Interim  history - bloodwork (Immunoglobulin levels, pneumococcal titer, tetanus/diptheria titer) normal. Keep track of infections and antibiotics use.  Return in about 3 months (around 08/27/2021). Advised patient to bring a copy of her allergy bloodwork that she had done at an integrative medicine clinic.   Meds ordered this encounter  Medications   ipratropium (ATROVENT) 0.06 % nasal spray    Sig: Place 1-2 sprays into both nostrils 3 (three) times daily as needed (drainage).    Dispense:  15 mL    Refill:  5   mometasone (NASONEX) 50 MCG/ACT nasal spray    Sig: Place 1 spray into the nose 2 (two) times daily as needed (nasal congestion).    Dispense:  1 each    Refill:  5   Lab Orders  No laboratory test(s) ordered today    Diagnostics: Spirometry:  Tracings reviewed. Her effort: Good reproducible efforts. FVC: 3.37L FEV1: 2.51L, 98% predicted FEV1/FVC ratio: 74% Interpretation: Spirometry consistent with normal pattern.  Please see scanned spirometry results for details.  Skin Testing: Environmental allergy panel and select foods. Negative to indoor/outdoor allergies and common foods.  Results discussed with patient/family.  Airborne Adult Perc - 05/27/21 1459     Time Antigen Placed 1500    Allergen Manufacturer Lavella Hammock    Location Back    Number of Test 59    1. Control-Buffer 50% Glycerol Negative    2. Control-Histamine 1 mg/ml 2+    3. Albumin saline Negative    4. [place] Negative    5. [place] Negative    6. Johnson Negative    7. Winslow Blue Negative    8. Meadow Fescue Negative    9. Perennial Rye Negative    10. Sweet Vernal Negative    11. Timothy Negative    12. Cocklebur Negative    13. Burweed Marshelder Negative    14. Ragweed, short Negative    15. Ragweed, Giant Negative    16. Plantain,  English Negative    17. Lamb's Quarters Negative    18. Sheep Sorrell Negative    19. Rough Pigweed Negative    20. Marsh Elder, Rough Negative    21. Mugwort,  Common Negative    22. Ash mix Negative    23. Birch mix Negative    24. Beech American Negative    25. Box, Elder Negative    26. Cedar, red Negative    27. Cottonwood, [place] Negative    28. Elm mix Negative    29. Hickory Negative    30. Maple mix Negative    31. Oak, [place] mix Negative    32. Pecan Pollen Negative    33. Pine mix Negative    34. Sycamore Eastern Negative    35. [place]ton, Black Pollen Negative    36. Alternaria alternata Negative    37. Cladosporium Herbarum Negative    38. Aspergillus mix Negative    39. Penicillium mix Negative    40. Bipolaris sorokiniana (Helminthosporium) Negative    41. Drechslera spicifera (Curvularia) Negative    42. Mucor plumbeus Negative    43. Fusarium moniliforme Negative    44. Aureobasidium pullulans (pullulara) Negative    45. Rhizopus oryzae Negative    46. Botrytis cinera Negative    47. Epicoccum nigrum Negative    48. Phoma betae Negative    49. Candida Albicans Negative    50.  Trichophyton mentagrophytes Negative    51. Mite, D Farinae  5,000 AU/ml Negative    52. Mite, D Pteronyssinus  5,000 AU/ml Negative    53. Cat Hair 10,000 BAU/ml Negative    54.  Dog Epithelia Negative    55. Mixed Feathers Negative    56. Horse Epithelia Negative    57. Cockroach, German Negative    58. Mouse Negative    59. Tobacco Leaf Negative             Food Perc - 05/27/21 1459       Test Information   Time Antigen Placed 1500    Allergen Manufacturer Greer    Location Back    Number of allergen test 10      Food   1. Peanut Negative    2. Soybean food Negative    3. Wheat, whole Negative    4. Sesame Negative    5. Milk, cow Negative    6. Egg White, chicken Negative    7. Casein Negative    8. Shellfish mix Negative    9. Fish mix Negative    10. Cashew Negative             Intradermal - 05/27/21 1500     Time Antigen Placed 1535    Allergen Manufacturer Lavella Hammock    Location Back    Number of Test 15     Control Negative   Simultaneous filing. User may not have seen previous data.   [place] Negative   Simultaneous filing. User may not have seen previous data.   Johnson Negative   Simultaneous filing. User may not have seen previous data.   7 Grass Negative   Simultaneous filing. User may not have seen previous data.   Ragweed mix Negative   Simultaneous filing. User may not have seen previous data.   Weed mix Negative   Simultaneous filing. User may not have seen previous data.   Tree mix Negative   Simultaneous filing. User may not have seen previous data.   Mold 1 Negative   Simultaneous filing. User may not have seen previous data.   Mold 2 Negative   Simultaneous filing. User may not have seen previous data.   Mold 3 Negative   Simultaneous filing. User may not have seen previous data.   Mold 4 Negative   Simultaneous filing. User may not have seen previous data.   Cat Negative   Simultaneous filing. User may not have seen previous data.   Dog Negative   Simultaneous filing. User may not have seen previous data.   Cockroach Negative   Simultaneous filing. User may not have seen previous data.   Mite mix Negative   Simultaneous filing. User may not have seen previous data.            Medication List:  Current Outpatient Medications  Medication Sig Dispense Refill   acetaminophen (TYLENOL) 500 MG tablet Take by mouth.     albuterol (VENTOLIN HFA) 108 (90 Base) MCG/ACT inhaler USE 2 PUFFS EVERY 4 HOURS AS NEEDED FOR COUGH, WHEEZE, TIGHTNESS IN CHEST, OR SHORTNESS OF BREATH 18 each 1   ascorbic acid (VITAMIN C) 1000 MG tablet Take by mouth.     aspirin-acetaminophen-caffeine (EXCEDRIN MIGRAINE) 250-250-65 MG tablet Take by mouth.     b complex vitamins capsule Take 1 capsule by mouth daily.     budesonide-formoterol (SYMBICORT) 80-4.5 MCG/ACT inhaler Inhale 2 puffs into the lungs in the morning and at  bedtime. with spacer and rinse mouth afterwards. 1 each 5   calcium citrate-vitamin D  (CITRACAL+D) 315-200 MG-UNIT tablet Take by mouth.     Cholecalciferol (VITAMIN D3) 125 MCG (5000 UT) CAPS Take 5,000 Units by mouth daily.     cycloSPORINE (RESTASIS) 0.05 % ophthalmic emulsion PLACE ONE DROP INTO BOTH EYES TWICE A DAY     Dermatological Products, Misc. (SUVICORT) EMUL      DHEA 25 MG CAPS Take by mouth.     hyoscyamine (LEVSIN SL) 0.125 MG SL tablet Place under the tongue every 4 (four) hours as needed.     ipratropium (ATROVENT) 0.06 % nasal spray Place 1-2 sprays into both nostrils 3 (three) times daily as needed (drainage). 15 mL 5   lidocaine (XYLOCAINE) 2 % jelly APPLY TOPICALLY TO AFFECTED AREA EVERY DAY AS NEEDED (use sparingly) 85 g 0   Lidocaine-Collagen-Aloe Vera (REGENECARE) 2 % GEL Apply topically.     LORazepam (ATIVAN) 0.5 MG tablet Take 1 tablet (0.5 mg total) by mouth 2 (two) times daily as needed for anxiety. 30 tablet 5   magnesium oxide (MAG-OX) 400 MG tablet Take by mouth.     melatonin 5 MG TABS Take 5 mg by mouth.     mometasone (NASONEX) 50 MCG/ACT nasal spray Place 1 spray into the nose 2 (two) times daily as needed (nasal congestion). 1 each 5   Plecanatide (TRULANCE) 3 MG TABS      Polyethylene Glycol 3350 (MIRALAX PO) Take by mouth as needed.     Probiotic Product (PROBIOTIC PO) Take by mouth daily.     Simethicone (GAS-X PO) Take by mouth.     tizanidine (ZANAFLEX) 2 MG capsule Take 1-2 capsules (2-4 mg total) by mouth 3 (three) times daily as needed for muscle spasms. 60 capsule 2   traMADol (ULTRAM) 50 MG tablet Take 1 tablet (50 mg total) by mouth every 12 (twelve) hours as needed. 30 tablet 1   UNABLE TO FIND Take 1 capsule by mouth daily. Dv3- vitamin D 3 plus immune support     albuterol (PROVENTIL) (2.5 MG/3ML) 0.083% nebulizer solution Take 3 mLs (2.5 mg total) by nebulization every 4 (four) hours as needed for wheezing or shortness of breath (coughing fits). (Patient not taking: Reported on 05/27/2021) 75 mL 1   budesonide (PULMICORT) 0.5  MG/2ML nebulizer solution USE 1 VIAL VIA NEBULIZER IN THE MORNING AND AT BEDTIME (Patient not taking: Reported on 05/27/2021) 360 mL 1   Current Facility-Administered Medications  Medication Dose Route Frequency Provider Last Rate Last Admin   zoledronic acid (RECLAST) injection 5 mg  5 mg Intravenous Once Gottschalk, Ashly M, DO       Allergies: Allergies  Allergen Reactions   Cefuroxime Axetil Other (See Comments)    Extreme gas   Gabapentin Other (See Comments)    Constipation Constipation   Montelukast Other (See Comments)    Numbness and tingling in hand. Numbness and tingling in hand. Numbness and tingling in hand. NUMBNESS OF HANDS AND FEET. Numbness in hands and feet NUMBNESS OF HANDS AND FEET.   Pregabalin Other (See Comments)    Drowsiness, dry mouth Drowsiness, dry mouth Makes her tired and thirsty   Augmentin [Amoxicillin-Pot Clavulanate] Diarrhea   Avelox [Moxifloxacin Hcl In Nacl] Other (See Comments)    Tremors    Biaxin [Clarithromycin]     Unsure of reaction   Cedax [Ceftibuten] Other (See Comments)    tremors   Ciprofloxacin Other (See Comments) and Nausea  And Vomiting    Blurred vision Blurred vision Pt can't remember reaction   Clindamycin/Lincomycin     tachycardia   Levofloxacin Other (See Comments)    Feels dehydrated   Lincomycin Other (See Comments)    tachycardia   Montelukast Sodium Other (See Comments)    Numbness and tingling in hand. Numbness and tingling in hand.   Sulfonamide Derivatives Other (See Comments)    Gi upset   Sulfa Antibiotics Nausea Only    Other reaction(s): Other (See Comments) Gi upset  Other reaction(s): Other (See Comments) Gi upset Other reaction(s): Unknown   I reviewed her past medical history, social history, family history, and environmental history and no significant changes have been reported from her previous visit.  Review of Systems  Constitutional:  Negative for appetite change, chills, fever and  unexpected weight change.  HENT:  Positive for postnasal drip and rhinorrhea. Negative for congestion.   Eyes:  Negative for itching.  Respiratory:  Positive for cough. Negative for chest tightness, shortness of breath and wheezing.   Gastrointestinal:  Negative for abdominal pain.  Skin:  Negative for rash.  Allergic/Immunologic: Negative for environmental allergies and food allergies.  Neurological:  Positive for headaches.   Objective: BP 90/68    Pulse 77    Temp 98.2 F (36.8 C) (Temporal)    Resp 16    Ht 5' 7.5" (1.715 m)    SpO2 98%    BMI 16.97 kg/m  Body mass index is 16.97 kg/m. Physical Exam Vitals and nursing note reviewed.  Constitutional:      Appearance: Normal appearance. She is well-developed.  HENT:     Head: Normocephalic and atraumatic.     Right Ear: External ear normal.     Left Ear: External ear normal.     Nose: Nose normal.     Mouth/Throat:     Mouth: Mucous membranes are moist.     Pharynx: Oropharynx is clear.  Eyes:     Conjunctiva/sclera: Conjunctivae normal.  Cardiovascular:     Rate and Rhythm: Normal rate and regular rhythm.     Heart sounds: Normal heart sounds. No murmur heard. Pulmonary:     Effort: Pulmonary effort is normal.     Breath sounds: Normal breath sounds. No wheezing, rhonchi or rales.  Musculoskeletal:     Cervical back: Neck supple.  Skin:    General: Skin is warm.     Findings: No rash.  Neurological:     Mental Status: She is alert and oriented to person, place, and time.  Psychiatric:        Behavior: Behavior normal.   Previous notes and tests were reviewed. The plan was reviewed with the patient/family, and all questions/concerned were addressed.  It was my pleasure to see Kristy Garza today and participate in her care. Please feel free to contact me with any questions or concerns.  Sincerely,  Rexene Alberts, DO Allergy & Immunology  [facility] office: [place] office: [phone]

## 2021-05-27 ENCOUNTER — Other Ambulatory Visit: Payer: Self-pay

## 2021-05-27 ENCOUNTER — Ambulatory Visit: Payer: Medicare HMO | Admitting: Allergy

## 2021-05-27 ENCOUNTER — Encounter: Payer: Self-pay | Admitting: Allergy

## 2021-05-27 VITALS — BP 90/68 | HR 77 | Temp 98.2°F | Resp 16 | Ht 67.5 in

## 2021-05-27 DIAGNOSIS — J310 Chronic rhinitis: Secondary | ICD-10-CM

## 2021-05-27 DIAGNOSIS — B999 Unspecified infectious disease: Secondary | ICD-10-CM

## 2021-05-27 DIAGNOSIS — J4540 Moderate persistent asthma, uncomplicated: Secondary | ICD-10-CM

## 2021-05-27 DIAGNOSIS — K219 Gastro-esophageal reflux disease without esophagitis: Secondary | ICD-10-CM

## 2021-05-27 LAB — URINE CULTURE: Organism ID, Bacteria: NO GROWTH

## 2021-05-27 MED ORDER — IPRATROPIUM BROMIDE 0.06 % NA SOLN: 1.0000 | Freq: Three times a day (TID) | NASAL | 5 refills | Status: DC | PRN

## 2021-05-27 MED ORDER — MOMETASONE FUROATE 50 MCG/ACT NA SUSP: 1.0000 | Freq: Two times a day (BID) | NASAL | 5 refills | Status: DC | PRN

## 2021-05-27 NOTE — Assessment & Plan Note (Signed)
Stopped PPI with no worsening symptoms.  ?? Continue dietary and lifestyle modifications as listed below ?? Monitor symptoms.  ?

## 2021-05-27 NOTE — Assessment & Plan Note (Signed)
Past history - Breo caused thrush. 2022 spirometry showed: normal pattern with 7% improvement in FEV1 post bronchodilator treatment. Clinically feeling much better. Covid-19 in Nov 2022. ?Interim history - doing better with Symbicort but can only tolerate 1 puff BID. No additional prednisone since the last visit. Some coughing in the mornings and uses albuterol twice per week with good benefit. No recent neb use. ?? Today's spirometry was normal.  ?? Daily controller medication(s): Symbicort 2mcg 1 puff twice a day with spacer and rinse mouth afterwards. ?? During upper respiratory infections/asthma flares:  ?o Start budesonide 0.5mg  nebulizer twice a day for 1-2 weeks until your breathing symptoms return to baseline.  ?o Pretreat with albuterol 2 puffs or albuterol nebulizer.  ?o If you need to use your albuterol nebulizer machine back to back within 15-30 minutes with no relief then please go to the ER/urgent care for further evaluation.  ?? May use albuterol rescue inhaler 2 puffs or nebulizer every 4 to 6 hours as needed for shortness of breath, chest tightness, coughing, and wheezing. May use albuterol rescue inhaler 2 puffs 5 to 15 minutes prior to strenuous physical activities. Monitor frequency of use.  ?? Get spirometry at next visit. ?

## 2021-05-27 NOTE — Assessment & Plan Note (Signed)
Past history - 2022 bloodwork negative to environmental allergy panel. ENT evaluation unremarkable (patient declined laryngoscopy) ?Interim history - still having symptoms and wants to get allergy skin testing done today.  ?? Today's skin testing showed: Negative to indoor/outdoor allergies and common foods.  ?? Discussed with patient that she can have these symptoms and not have environmental allergies.  ?? Use Nasacort (triamcinolone) or Nasonex nasal spray 1 spray per nostril twice a day as needed for nasal congestion.  ?? Use Atrovent (ipratropium) 0.63% 1-2 sprays per nostril three a day as needed for runny nose/drainage. ?? Nasal saline spray (i.e., Simply Saline) or nasal saline lavage (i.e., NeilMed) is recommended as needed and prior to medicated nasal sprays. ?

## 2021-05-27 NOTE — Assessment & Plan Note (Signed)
Interim history - bloodwork (Immunoglobulin levels, pneumococcal titer, tetanus/diptheria titer) normal. . Keep track of infections and antibiotics use. 

## 2021-05-27 NOTE — Patient Instructions (Addendum)
Today's skin testing showed: ?Negative to indoor/outdoor allergies and common foods.  ? ?Results given. ? ?Rhinitis ?Use Nasacort (triamcinolone) or Nasonex nasal spray 1 spray per nostril twice a day as needed for nasal congestion. ?Use Atrovent (ipratropium) 0.63% 1-2 sprays per nostril three a day as needed for runny nose/drainage. ?Nasal saline spray (i.e., Simply Saline) or nasal saline lavage (i.e., NeilMed) is recommended as needed and prior to medicated nasal sprays. ? ?Asthma ?Daily controller medication(s): Symbicort 24mcg 1 puff twice a day with spacer and rinse mouth afterwards. ?During upper respiratory infections/asthma flares:  ?Start budesonide 0.5mg  nebulizer twice a day for 1-2 weeks until your breathing symptoms return to baseline.  ?Pretreat with albuterol 2 puffs or albuterol nebulizer.  ?If you need to use your albuterol nebulizer machine back to back within 15-30 minutes with no relief then please go to the ER/urgent care for further evaluation.  ?May use albuterol rescue inhaler 2 puffs or nebulizer every 4 to 6 hours as needed for shortness of breath, chest tightness, coughing, and wheezing. May use albuterol rescue inhaler 2 puffs 5 to 15 minutes prior to strenuous physical activities. Monitor frequency of use.  ?Asthma control goals:  ?Full participation in all desired activities (may need albuterol before activity) ?Albuterol use two times or less a week on average (not counting use with activity) ?Cough interfering with sleep two times or less a month ?Oral steroids no more than once a year ?No hospitalizations  ? ?Reflux ?Continue dietary and lifestyle modifications as below. ?Monitor symptoms.  ? ?Follow up in 3 months or sooner if needed.   ? ?Please bring a copy of your bloodwork to your visit - the one that was done at the integrative medicine. ? ?

## 2021-05-28 NOTE — Progress Notes (Signed)
Patient calling back about lab results.  °

## 2021-06-01 ENCOUNTER — Ambulatory Visit (INDEPENDENT_AMBULATORY_CARE_PROVIDER_SITE_OTHER): Payer: Medicare HMO | Admitting: Family

## 2021-06-01 ENCOUNTER — Encounter: Payer: Self-pay | Admitting: Family

## 2021-06-01 VITALS — BP 120/75 | HR 75 | Temp 98.3°F | Ht 67.5 in | Wt 114.0 lb

## 2021-06-01 DIAGNOSIS — R399 Unspecified symptoms and signs involving the genitourinary system: Secondary | ICD-10-CM

## 2021-06-01 DIAGNOSIS — N3010 Interstitial cystitis (chronic) without hematuria: Secondary | ICD-10-CM

## 2021-06-01 LAB — MICROSCOPIC EXAMINATION
Bacteria, UA: NONE SEEN
Epithelial Cells (non renal): NONE SEEN /hpf (ref 0–10)
RBC, Urine: NONE SEEN /hpf (ref 0–2)
Renal Epithel, UA: NONE SEEN /hpf
WBC, UA: NONE SEEN /hpf (ref 0–5)

## 2021-06-01 LAB — URINALYSIS, COMPLETE
Bilirubin, UA: NEGATIVE
Glucose, UA: NEGATIVE
Ketones, UA: NEGATIVE
Leukocytes,UA: NEGATIVE
Nitrite, UA: NEGATIVE
Protein,UA: NEGATIVE
RBC, UA: NEGATIVE
Specific Gravity, UA: 1.01 (ref 1.005–1.030)
Urobilinogen, Ur: 0.2 mg/dL (ref 0.2–1.0)
pH, UA: 7 (ref 5.0–7.5)

## 2021-06-01 MED ORDER — REGENECARE 2 % EX GEL: CUTANEOUS | 2 refills | Status: AC

## 2021-06-01 NOTE — Progress Notes (Signed)
? ?  Subjective:  ? ? Patient ID: Kristy Garza, female    DOB: 12-Jul-1950, 71 y.o.   MRN: [id number] ? ?Chief Complaint  ?Patient presents with  ? Dysuria  ? ?PT presents to the office today for dysuria. She has interstitial cystitis and has a Urologists. She uses regenecare ha 2% gel that helps with the dysuria.  ?Dysuria  ?This is a new problem. The current episode started 1 to 4 weeks ago. The problem occurs intermittently. The problem has been waxing and waning. The quality of the pain is described as burning. The pain is at a severity of 5/10. The pain is mild. There has been no fever. Pertinent negatives include no chills, discharge, flank pain, frequency, hematuria, hesitancy, nausea, urgency or vomiting. She has tried increased fluids for the symptoms. The treatment provided mild relief.  ? ? ? ?Review of Systems  ?Constitutional:  Negative for chills.  ?Gastrointestinal:  Negative for nausea and vomiting.  ?Genitourinary:  Positive for dysuria. Negative for flank pain, frequency, hematuria, hesitancy and urgency.  ?All other systems reviewed and are negative. ? ?   ?Objective:  ? Physical Exam ?Vitals reviewed.  ?Constitutional:   ?   General: She is not in acute distress. ?   Appearance: She is well-developed.  ?HENT:  ?   Head: Normocephalic and atraumatic.  ?   Comments: Frequent throat clearing ?Eyes:  ?   Pupils: Pupils are equal, round, and reactive to light.  ?Neck:  ?   Thyroid: No thyromegaly.  ?Cardiovascular:  ?   Rate and Rhythm: Normal rate and regular rhythm.  ?   Heart sounds: Normal heart sounds. No murmur heard. ?Pulmonary:  ?   Effort: Pulmonary effort is normal. No respiratory distress.  ?   Breath sounds: Normal breath sounds. No wheezing.  ?Abdominal:  ?   General: Bowel sounds are normal. There is no distension.  ?   Palpations: Abdomen is soft.  ?   Tenderness: There is no abdominal tenderness.  ?Musculoskeletal:     ?   General: No tenderness. Normal range of motion.  ?   Cervical  back: Normal range of motion and neck supple.  ?Skin: ?   General: Skin is warm and dry.  ?Neurological:  ?   Mental Status: She is alert and oriented to person, place, and time.  ?   Cranial Nerves: No cranial nerve deficit.  ?   Deep Tendon Reflexes: Reflexes are normal and symmetric.  ?Psychiatric:     ?   Behavior: Behavior normal.     ?   Thought Content: Thought content normal.     ?   Judgment: Judgment normal.  ? ? ?BP 120/75   Pulse 75   Temp 98.3 ?F (36.8 ?C) (Temporal)   Ht 5' 7.5" (1.715 m)   Wt 114 lb (51.7 kg)   BMI 17.59 kg/m?  ?   ?Assessment & Plan:  ?Kristy Garza comes in today with chief complaint of Dysuria ? ? ?Diagnosis and orders addressed: ? ?1. UTI symptoms ?Urine clear ?Force fluids  ?- Urinalysis, Complete ? ?2. Interstitial cystitis ?Call urologists for bladder instillation ?Avoid spicy foods ?Limit stress ?- Lidocaine-Collagen-Aloe Vera (REGENECARE) 2 % GEL; Apply topically daily.  Dispense: 85 g; Refill: 2 ? ? ?Evelina Dun, FNP ? ? ?

## 2021-06-01 NOTE — Patient Instructions (Signed)
Interstitial Cystitis °Interstitial cystitis is inflammation of the bladder. This condition is also known as painful bladder syndrome. This may cause pain in the bladder area as well as a frequent and urgent need to urinate. The bladder is an organ that stores urine after the urine is made in the kidneys. °The severity of interstitial cystitis can vary from person to person. You may have flare-ups, and then your symptoms may go away for a while. For many people, it becomes a long-term (chronic) problem. °What are the causes? °The cause of this condition is not known. °What increases the risk? °The following factors may make you more likely to develop this condition: °Being female. °Having fibromyalgia. °Having irritable bowel syndrome (IBS). °Having endometriosis. °Having chronic fatigue syndrome. °This condition may be aggravated by: °Stress. °Smoking. °Spicy foods. °What are the signs or symptoms? °Symptoms of interstitial cystitis vary, and they can change over time. Symptoms may include: °Discomfort or pain in the bladder area, which is in the lower abdomen. Pain can range from mild to severe. The pain may change in intensity as the bladder fills with urine or as it empties. °Pain in the pelvic area, between the hip bones. °A constant urge to urinate. °Frequent urination. °Pain during urination. °Pain during sex. °Blood in the urine. °Feeling tired (fatigue). °For women, symptoms often get worse during menstruation. °How is this diagnosed? °This condition is diagnosed based on your symptoms, your medical history, and a physical exam. Your health care provider may need to rule out other conditions and may order other tests, such as: °Urine tests. °Cystoscopy. For this test, a tool similar to a very thin telescope is used to look into your bladder. °Biopsy. This involves taking a sample of tissue from the bladder to be examined under a microscope. °How is this treated? °There is no cure for this condition, but  treatment can help you control your symptoms. Work closely with your health care provider to find the most effective treatments for you. Treatment options may include: °Medicines to relieve pain and reduce how often you feel the need to urinate. This treatment may include: °A procedure where a small amount of medicine that eases irritation is put inside your bladder through a catheter (bladder instillation). °Lifestyle changes, such as changing your diet or taking steps to control stress. °Physical therapy. This may include: °Exercises to help relax the pelvic floor muscles. °Massage to relax tight muscles (myofascial release). °Learning ways to control when you urinate (bladder training). °Using a device that provides electrical stimulation to your nerves, which can relieve pain (neuromodulation therapy). The device is placed on your back, where it blocks the nerves that cause you to feel pain in your bladder area. °A procedure that stretches your bladder by filling it with air or fluid (hydrodistention). °Surgery. This is rare. It is only done for extreme cases, if other treatments do not help. °Follow these instructions at home: °Lifestyle °Learn and practice relaxation techniques, such as deep breathing and muscle relaxation. °Get care for your body and mental well-being, such as: °Cognitive behavioral therapy (CBT). This therapy changes the way you think or act in response to different situations. This may improve how you feel. °Seeing a mental health therapist to evaluate and treat depression, if necessary. °Work with your health care provider on other ways to manage pain. Acupuncture may be helpful. °Avoid drinking alcohol. °Do not use any products that contain nicotine or tobacco. These products include cigarettes, chewing tobacco, and vaping devices, such as   e-cigarettes. If you need help quitting, ask your health care provider. °Eating and drinking °Make dietary changes as recommended by your health care  provider. You may need to avoid: °Spicy foods. °Foods that contain a lot of potassium. °Limit your intake of drinks that increase your urge to urinate. These include alcohol and caffeinated drinks like soda, coffee, and tea. °Bladder training ° °Use bladder training techniques as directed. Techniques may include: °Urinating at scheduled times. °Training yourself to delay urination. °Keep a bladder diary. °Write down the times you urinate and any symptoms that you have. This can help you find out which foods, liquids, or activities make your symptoms worse. °Use your bladder diary to schedule bathroom trips. If you are away from home, plan to be near a bathroom at each of your scheduled times. °Make sure that you urinate just before you leave the house and just before you go to bed. °General instructions °Take over-the-counter and prescription medicines only as told by your health care provider. °Try a warm or cool compress over your bladder for comfort. °Avoid wearing tight clothing. °Do exercises to relax your pelvic floor muscles as told by your physical therapist. °Keep all follow-up visits. This is important. °Where to find more information °To find more information or a support group near you, visit: °[facility] [employer] [facility]: urologyhealth.org °Interstitial Cystitis Association: ichelp.org °Contact a health care provider if you have: °Symptoms that do not get better with treatment. °Pain or discomfort that gets worse. °More frequent urges to urinate. °A fever. °Get help right away if: °You have no control over when you urinate. °Summary °Interstitial cystitis is inflammation of the bladder. °This condition may cause pain in the bladder area as well as a frequent and urgent need to urinate. °You may have flare-ups of the condition, and then it may go away for a while. For many people, it becomes a long-term (chronic) problem. °There is no cure for interstitial cystitis, but treatment methods are available to  control your symptoms. °This information is not intended to replace advice given to you by your health care provider. Make sure you discuss any questions you have with your health care provider. °Document Revised: 10/18/2019 Document Reviewed: 10/18/2019 °Elsevier Patient Education © 2022 [employer] ° °

## 2021-06-02 DIAGNOSIS — N3010 Interstitial cystitis (chronic) without hematuria: Secondary | ICD-10-CM | POA: Diagnosis not present

## 2021-06-03 DIAGNOSIS — M9903 Segmental and somatic dysfunction of lumbar region: Secondary | ICD-10-CM | POA: Diagnosis not present

## 2021-06-03 DIAGNOSIS — M9901 Segmental and somatic dysfunction of cervical region: Secondary | ICD-10-CM | POA: Diagnosis not present

## 2021-06-03 DIAGNOSIS — M5137 Other intervertebral disc degeneration, lumbosacral region: Secondary | ICD-10-CM | POA: Diagnosis not present

## 2021-06-03 DIAGNOSIS — M9902 Segmental and somatic dysfunction of thoracic region: Secondary | ICD-10-CM | POA: Diagnosis not present

## 2021-06-07 DIAGNOSIS — N3010 Interstitial cystitis (chronic) without hematuria: Secondary | ICD-10-CM | POA: Diagnosis not present

## 2021-06-09 DIAGNOSIS — M9903 Segmental and somatic dysfunction of lumbar region: Secondary | ICD-10-CM | POA: Diagnosis not present

## 2021-06-09 DIAGNOSIS — M9902 Segmental and somatic dysfunction of thoracic region: Secondary | ICD-10-CM | POA: Diagnosis not present

## 2021-06-09 DIAGNOSIS — M9901 Segmental and somatic dysfunction of cervical region: Secondary | ICD-10-CM | POA: Diagnosis not present

## 2021-06-09 DIAGNOSIS — M5137 Other intervertebral disc degeneration, lumbosacral region: Secondary | ICD-10-CM | POA: Diagnosis not present

## 2021-06-16 DIAGNOSIS — M5137 Other intervertebral disc degeneration, lumbosacral region: Secondary | ICD-10-CM | POA: Diagnosis not present

## 2021-06-16 DIAGNOSIS — M9902 Segmental and somatic dysfunction of thoracic region: Secondary | ICD-10-CM | POA: Diagnosis not present

## 2021-06-16 DIAGNOSIS — M9903 Segmental and somatic dysfunction of lumbar region: Secondary | ICD-10-CM | POA: Diagnosis not present

## 2021-06-16 DIAGNOSIS — M9901 Segmental and somatic dysfunction of cervical region: Secondary | ICD-10-CM | POA: Diagnosis not present

## 2021-06-23 ENCOUNTER — Telehealth: Payer: Medicare HMO

## 2021-06-23 DIAGNOSIS — M9901 Segmental and somatic dysfunction of cervical region: Secondary | ICD-10-CM | POA: Diagnosis not present

## 2021-06-23 DIAGNOSIS — M9902 Segmental and somatic dysfunction of thoracic region: Secondary | ICD-10-CM | POA: Diagnosis not present

## 2021-06-23 DIAGNOSIS — M5137 Other intervertebral disc degeneration, lumbosacral region: Secondary | ICD-10-CM | POA: Diagnosis not present

## 2021-06-23 DIAGNOSIS — M9903 Segmental and somatic dysfunction of lumbar region: Secondary | ICD-10-CM | POA: Diagnosis not present

## 2021-06-30 DIAGNOSIS — M5137 Other intervertebral disc degeneration, lumbosacral region: Secondary | ICD-10-CM | POA: Diagnosis not present

## 2021-06-30 DIAGNOSIS — M9902 Segmental and somatic dysfunction of thoracic region: Secondary | ICD-10-CM | POA: Diagnosis not present

## 2021-06-30 DIAGNOSIS — M9903 Segmental and somatic dysfunction of lumbar region: Secondary | ICD-10-CM | POA: Diagnosis not present

## 2021-06-30 DIAGNOSIS — M9901 Segmental and somatic dysfunction of cervical region: Secondary | ICD-10-CM | POA: Diagnosis not present

## 2021-07-08 DIAGNOSIS — M9901 Segmental and somatic dysfunction of cervical region: Secondary | ICD-10-CM | POA: Diagnosis not present

## 2021-07-08 DIAGNOSIS — M9903 Segmental and somatic dysfunction of lumbar region: Secondary | ICD-10-CM | POA: Diagnosis not present

## 2021-07-08 DIAGNOSIS — M9902 Segmental and somatic dysfunction of thoracic region: Secondary | ICD-10-CM | POA: Diagnosis not present

## 2021-07-08 DIAGNOSIS — M5137 Other intervertebral disc degeneration, lumbosacral region: Secondary | ICD-10-CM | POA: Diagnosis not present

## 2021-07-15 DIAGNOSIS — M9903 Segmental and somatic dysfunction of lumbar region: Secondary | ICD-10-CM | POA: Diagnosis not present

## 2021-07-15 DIAGNOSIS — M5137 Other intervertebral disc degeneration, lumbosacral region: Secondary | ICD-10-CM | POA: Diagnosis not present

## 2021-07-15 DIAGNOSIS — M9902 Segmental and somatic dysfunction of thoracic region: Secondary | ICD-10-CM | POA: Diagnosis not present

## 2021-07-15 DIAGNOSIS — M9901 Segmental and somatic dysfunction of cervical region: Secondary | ICD-10-CM | POA: Diagnosis not present

## 2021-07-22 DIAGNOSIS — M9902 Segmental and somatic dysfunction of thoracic region: Secondary | ICD-10-CM | POA: Diagnosis not present

## 2021-07-22 DIAGNOSIS — M9903 Segmental and somatic dysfunction of lumbar region: Secondary | ICD-10-CM | POA: Diagnosis not present

## 2021-07-22 DIAGNOSIS — M9901 Segmental and somatic dysfunction of cervical region: Secondary | ICD-10-CM | POA: Diagnosis not present

## 2021-07-22 DIAGNOSIS — M5137 Other intervertebral disc degeneration, lumbosacral region: Secondary | ICD-10-CM | POA: Diagnosis not present

## 2021-07-26 ENCOUNTER — Ambulatory Visit (INDEPENDENT_AMBULATORY_CARE_PROVIDER_SITE_OTHER): Payer: Medicare HMO | Admitting: Family Medicine

## 2021-07-26 ENCOUNTER — Encounter: Payer: Self-pay | Admitting: Family Medicine

## 2021-07-26 VITALS — BP 117/78 | HR 60 | Temp 98.0°F | Ht 67.5 in | Wt 112.8 lb

## 2021-07-26 DIAGNOSIS — E440 Moderate protein-calorie malnutrition: Secondary | ICD-10-CM

## 2021-07-26 DIAGNOSIS — N3010 Interstitial cystitis (chronic) without hematuria: Secondary | ICD-10-CM

## 2021-07-26 DIAGNOSIS — R6884 Jaw pain: Secondary | ICD-10-CM | POA: Diagnosis not present

## 2021-07-26 DIAGNOSIS — Z13220 Encounter for screening for lipoid disorders: Secondary | ICD-10-CM

## 2021-07-26 DIAGNOSIS — H04129 Dry eye syndrome of unspecified lacrimal gland: Secondary | ICD-10-CM

## 2021-07-26 DIAGNOSIS — G894 Chronic pain syndrome: Secondary | ICD-10-CM

## 2021-07-26 DIAGNOSIS — K219 Gastro-esophageal reflux disease without esophagitis: Secondary | ICD-10-CM

## 2021-07-26 DIAGNOSIS — G8929 Other chronic pain: Secondary | ICD-10-CM | POA: Diagnosis not present

## 2021-07-26 DIAGNOSIS — M810 Age-related osteoporosis without current pathological fracture: Secondary | ICD-10-CM | POA: Diagnosis not present

## 2021-07-26 DIAGNOSIS — R69 Illness, unspecified: Secondary | ICD-10-CM | POA: Diagnosis not present

## 2021-07-26 DIAGNOSIS — F411 Generalized anxiety disorder: Secondary | ICD-10-CM

## 2021-07-26 MED ORDER — TRAMADOL HCL 50 MG PO TABS: 50.0000 mg | ORAL_TABLET | Freq: Two times a day (BID) | ORAL | 1 refills | Status: DC | PRN

## 2021-07-26 MED ORDER — DULOXETINE HCL 30 MG PO CPEP: 30.0000 mg | ORAL_CAPSULE | Freq: Every day | ORAL | 1 refills | Status: DC

## 2021-07-26 MED ORDER — FAMOTIDINE 20 MG PO TABS: 20.0000 mg | ORAL_TABLET | Freq: Two times a day (BID) | ORAL | 0 refills | Status: DC | PRN

## 2021-07-26 NOTE — Progress Notes (Signed)
? ?Subjective: ?CC: Chronic follow-up ?PCP: [name], DO ?VOZ:DGUYQI SIGNORA ZUCCO is a 71 y.o. female presenting to clinic today for: ? ?1.  Osteoporosis ?Patient is due for Reclast in July.  She apparently was contacted by [facility] to inform her that her appoint will be canceled as they are no longer administering Reclast at [facility].  This will be her second infusion.  Her last DEXA was performed in August 2022 and demonstrated osteoporosis with T score of -4.6 at the femur. ? ?2.  Malnutrition ?Patient reports that she recently added some Ensure to try and supplement her diet.  She is tolerating this from a GI standpoint without difficulty.  She is utilizing Metamucil and MiraLAX as needed constipation but does still have Trulance for severe episodes.  No blood reported in stool.  She continues to follow-up with gastroenterology as directed.  Acid reflux has been more prevalent despite use of Protonix ? ?3.  Dry eye ?Patient reports that she will be seeing her eye doctor tomorrow about this but wanted to inquire as to whether or not there were any other treatments for dry eye besides Restasis.  She does not feel like this is working very well.  She does suffer from some allergies but does not feel the symptoms to be related to allergies. ? ?4.  Chronic pain ?Patient continues to have chronic pain in her neck and other various joints.  This is not well relieved by Tylenol currently.  She uses tramadol as needed and would like to get a renewal on that.  She uses extremely sparingly.  She admits to ongoing anxiety and feels that the Ativan is overly sedating so she wishes to discontinue that.  She has never been treated with Cymbalta but would be willing to try. ? ? ?ROS: Per HPI ? ?Allergies  ?Allergen Reactions  ? Cefuroxime Axetil Other (See Comments)  ?  Extreme gas  ? Gabapentin Other (See Comments)  ?  Constipation ?Constipation  ? Montelukast Other (See Comments)  ?  Numbness and tingling in  hand. ?Numbness and tingling in hand. ?Numbness and tingling in hand. ?NUMBNESS OF HANDS AND FEET. ?Numbness in hands and feet ?NUMBNESS OF HANDS AND FEET.  ? Pregabalin Other (See Comments)  ?  Drowsiness, dry mouth ?Drowsiness, dry mouth ?Makes her tired and thirsty  ? Augmentin [Amoxicillin-Pot Clavulanate] Diarrhea  ? Avelox [Moxifloxacin Hcl In Nacl] Other (See Comments)  ?  Tremors ?  ? Biaxin [Clarithromycin]   ?  Unsure of reaction  ? Cedax [Ceftibuten] Other (See Comments)  ?  tremors  ? Ciprofloxacin Other (See Comments) and Nausea And Vomiting  ?  Blurred vision ?Blurred vision ?Pt can't remember reaction  ? Clindamycin/Lincomycin   ?  tachycardia  ? Levofloxacin Other (See Comments)  ?  Feels dehydrated  ? Lincomycin Other (See Comments)  ?  tachycardia  ? Montelukast Sodium Other (See Comments)  ?  Numbness and tingling in hand. ?Numbness and tingling in hand.  ? Sulfonamide Derivatives Other (See Comments)  ?  Gi upset  ? Sulfa Antibiotics Nausea Only  ?  Other reaction(s): Other (See Comments) ?Gi upset ? ?Other reaction(s): Other (See Comments) ?Gi upset ?Other reaction(s): Unknown  ? ?Past Medical History:  ?Diagnosis Date  ? Acid reflux   ? Allergy   ? Arthritis   ? ARTHRITIS IN NECK BY DR. Alroy Dust [facility]  ? Asthma   ? Interstitial cystitis   ? Osteoporosis   ? PONV (postoperative  nausea and vomiting)   ? Tinnitus   ? Trigeminal neuralgia   ? Atypical trigeminal neuralgia  ? ? ?Current Outpatient Medications:  ?  acetaminophen (TYLENOL) 500 MG tablet, Take by mouth., Disp: , Rfl:  ?  albuterol (PROVENTIL) (2.5 MG/3ML) 0.083% nebulizer solution, Take 3 mLs (2.5 mg total) by nebulization every 4 (four) hours as needed for wheezing or shortness of breath (coughing fits)., Disp: 75 mL, Rfl: 1 ?  albuterol (VENTOLIN HFA) 108 (90 Base) MCG/ACT inhaler, USE 2 PUFFS EVERY 4 HOURS AS NEEDED FOR COUGH, WHEEZE, TIGHTNESS IN CHEST, OR SHORTNESS OF BREATH, Disp: 18 each, Rfl: 1 ?  ascorbic acid (VITAMIN C) 1000  MG tablet, Take by mouth., Disp: , Rfl:  ?  aspirin-acetaminophen-caffeine (EXCEDRIN MIGRAINE) 250-250-65 MG tablet, Take by mouth., Disp: , Rfl:  ?  b complex vitamins capsule, Take 1 capsule by mouth daily., Disp: , Rfl:  ?  budesonide-formoterol (SYMBICORT) 80-4.5 MCG/ACT inhaler, Inhale 2 puffs into the lungs in the morning and at bedtime. with spacer and rinse mouth afterwards., Disp: 1 each, Rfl: 5 ?  calcium citrate-vitamin D (CITRACAL+D) 315-200 MG-UNIT tablet, Take by mouth., Disp: , Rfl:  ?  Cholecalciferol (VITAMIN D3) 125 MCG (5000 UT) CAPS, Take 5,000 Units by mouth daily., Disp: , Rfl:  ?  cycloSPORINE (RESTASIS) 0.05 % ophthalmic emulsion, PLACE ONE DROP INTO BOTH EYES TWICE A DAY, Disp: , Rfl:  ?  Dermatological Products, Misc. (SUVICORT) EMUL, , Disp: , Rfl:  ?  DHEA 25 MG CAPS, Take by mouth., Disp: , Rfl:  ?  hyoscyamine (LEVSIN SL) 0.125 MG SL tablet, Place under the tongue every 4 (four) hours as needed., Disp: , Rfl:  ?  ipratropium (ATROVENT) 0.06 % nasal spray, Place 1-2 sprays into both nostrils 3 (three) times daily as needed (drainage)., Disp: 15 mL, Rfl: 5 ?  lidocaine (XYLOCAINE) 2 % jelly, APPLY TOPICALLY TO AFFECTED AREA EVERY DAY AS NEEDED (use sparingly), Disp: 85 g, Rfl: 0 ?  Lidocaine-Collagen-Aloe Vera (REGENECARE) 2 % GEL, Apply topically daily., Disp: 85 g, Rfl: 2 ?  LORazepam (ATIVAN) 0.5 MG tablet, Take 1 tablet (0.5 mg total) by mouth 2 (two) times daily as needed for anxiety., Disp: 30 tablet, Rfl: 5 ?  magnesium oxide (MAG-OX) 400 MG tablet, Take by mouth., Disp: , Rfl:  ?  melatonin 5 MG TABS, Take 5 mg by mouth., Disp: , Rfl:  ?  mometasone (NASONEX) 50 MCG/ACT nasal spray, Place 1 spray into the nose 2 (two) times daily as needed (nasal congestion)., Disp: 1 each, Rfl: 5 ?  Plecanatide (TRULANCE) 3 MG TABS, , Disp: , Rfl:  ?  Polyethylene Glycol 3350 (MIRALAX PO), Take by mouth as needed., Disp: , Rfl:  ?  Probiotic Product (PROBIOTIC PO), Take by mouth daily., Disp: ,  Rfl:  ?  Simethicone (GAS-X PO), Take by mouth., Disp: , Rfl:  ?  tizanidine (ZANAFLEX) 2 MG capsule, Take 1-2 capsules (2-4 mg total) by mouth 3 (three) times daily as needed for muscle spasms., Disp: 60 capsule, Rfl: 2 ?  traMADol (ULTRAM) 50 MG tablet, Take 1 tablet (50 mg total) by mouth every 12 (twelve) hours as needed., Disp: 30 tablet, Rfl: 1 ?  UNABLE TO FIND, Take 1 capsule by mouth daily. Dv3- vitamin D 3 plus immune support, Disp: , Rfl:  ? ?Current Facility-Administered Medications:  ?  zoledronic acid (RECLAST) injection 5 mg, 5 mg, Intravenous, Once, Ronnie Doss M, DO ?Social History  ? ?Socioeconomic History  ?  Marital status: Married  ?  Spouse name: [name]  ? Number of children: 3  ? Years of education: 6  ? Highest education level: Some college, no degree  ?Occupational History  ? Occupation: CNA  ?  Comment: Retired  ?Tobacco Use  ? Smoking status: Never  ? Smokeless tobacco: Never  ?Vaping Use  ? Vaping Use: Never used  ?Substance and Sexual Activity  ? Alcohol use: No  ?  Alcohol/week: 0.0 standard drinks  ?  Comment: 01-19-2016 per pt no  ? Drug use: No  ?  Comment: 01-19-2016 per pt no   ? Sexual activity: Not Currently  ?  Birth control/protection: Surgical  ?Other Topics Concern  ? Not on file  ?Social History Narrative  ? Lives with husband.   ? ?Social Determinants of Health  ? ?Financial Resource Strain: Not on file  ?Food Insecurity: Not on file  ?Transportation Needs: Unmet Transportation Needs  ? Lack of Transportation (Medical): No  ? Lack of Transportation (Non-Medical): Yes  ?Physical Activity: Inactive  ? Days of Exercise per Week: 0 days  ? Minutes of Exercise per Session: 0 min  ?Stress: Stress Concern Present  ? Feeling of Stress : Rather much  ?Social Connections: Not on file  ?Intimate Partner Violence: Not on file  ? ?Family History  ?Problem Relation Age of Onset  ? Hyperlipidemia Mother   ? Arthritis Mother   ? Cancer Mother   ? Lymphoma Mother   ? Cancer Father   ?  Allergies Father   ? Allergies Sister   ? Allergies Sister   ? Allergic rhinitis Neg Hx   ? Angioedema Neg Hx   ? Asthma Neg Hx   ? Eczema Neg Hx   ? Immunodeficiency Neg Hx   ? Urticaria Neg Hx   ? ? ?O

## 2021-07-26 NOTE — Patient Instructions (Signed)
You had labs performed today.  You will be contacted with the results of the labs once they are available, usually in the next 3 business days for routine lab work.  If you have an active my chart account, they will be released to your MyChart.  If you prefer to have these labs released to you via telephone, please let us know.     

## 2021-07-27 DIAGNOSIS — H52229 Regular astigmatism, unspecified eye: Secondary | ICD-10-CM | POA: Diagnosis not present

## 2021-07-27 DIAGNOSIS — Z0100 Encounter for examination of eyes and vision without abnormal findings: Secondary | ICD-10-CM | POA: Diagnosis not present

## 2021-07-27 DIAGNOSIS — H25813 Combined forms of age-related cataract, bilateral: Secondary | ICD-10-CM | POA: Diagnosis not present

## 2021-07-27 LAB — CBC
Hematocrit: 38.1 % (ref 34.0–46.6)
Hemoglobin: 12.4 g/dL (ref 11.1–15.9)
MCH: 30 pg (ref 26.6–33.0)
MCHC: 32.5 g/dL (ref 31.5–35.7)
MCV: 92 fL (ref 79–97)
Platelets: 211 10*3/uL (ref 150–450)
RBC: 4.13 x10E6/uL (ref 3.77–5.28)
RDW: 11.7 % (ref 11.7–15.4)
WBC: 4.6 10*3/uL (ref 3.4–10.8)

## 2021-07-27 LAB — CMP14+EGFR
ALT: 15 IU/L (ref 0–32)
AST: 23 IU/L (ref 0–40)
Albumin/Globulin Ratio: 2.1 (ref 1.2–2.2)
Albumin: 4.6 g/dL (ref 3.8–4.8)
Alkaline Phosphatase: 53 IU/L (ref 44–121)
BUN/Creatinine Ratio: 8 — ABNORMAL LOW (ref 12–28)
BUN: 6 mg/dL — ABNORMAL LOW (ref 8–27)
Bilirubin Total: 0.4 mg/dL (ref 0.0–1.2)
CO2: 24 mmol/L (ref 20–29)
Calcium: 9.4 mg/dL (ref 8.7–10.3)
Chloride: 102 mmol/L (ref 96–106)
Creatinine, Ser: 0.74 mg/dL (ref 0.57–1.00)
Globulin, Total: 2.2 g/dL (ref 1.5–4.5)
Glucose: 92 mg/dL (ref 70–99)
Potassium: 4.5 mmol/L (ref 3.5–5.2)
Sodium: 140 mmol/L (ref 134–144)
Total Protein: 6.8 g/dL (ref 6.0–8.5)
eGFR: 87 mL/min/{1.73_m2} (ref >59)

## 2021-07-27 LAB — LIPID PANEL
Chol/HDL Ratio: 2.4 ratio (ref 0.0–4.4)
Cholesterol, Total: 181 mg/dL (ref 100–199)
HDL: 75 mg/dL (ref >39)
LDL Chol Calc (NIH): 94 mg/dL (ref 0–99)
Triglycerides: 60 mg/dL (ref 0–149)
VLDL Cholesterol Cal: 12 mg/dL (ref 5–40)

## 2021-07-27 LAB — VITAMIN B12: Vitamin B-12: 530 pg/mL (ref 232–1245)

## 2021-07-27 LAB — VITAMIN D 25 HYDROXY (VIT D DEFICIENCY, FRACTURES): Vit D, 25-Hydroxy: 67.1 ng/mL (ref 30.0–100.0)

## 2021-07-28 DIAGNOSIS — M9901 Segmental and somatic dysfunction of cervical region: Secondary | ICD-10-CM | POA: Diagnosis not present

## 2021-07-28 DIAGNOSIS — M9903 Segmental and somatic dysfunction of lumbar region: Secondary | ICD-10-CM | POA: Diagnosis not present

## 2021-07-28 DIAGNOSIS — M9902 Segmental and somatic dysfunction of thoracic region: Secondary | ICD-10-CM | POA: Diagnosis not present

## 2021-07-28 DIAGNOSIS — M5137 Other intervertebral disc degeneration, lumbosacral region: Secondary | ICD-10-CM | POA: Diagnosis not present

## 2021-08-03 LAB — TOXASSURE SELECT 13 (MW), URINE

## 2021-08-16 DIAGNOSIS — M9902 Segmental and somatic dysfunction of thoracic region: Secondary | ICD-10-CM | POA: Diagnosis not present

## 2021-08-16 DIAGNOSIS — M5137 Other intervertebral disc degeneration, lumbosacral region: Secondary | ICD-10-CM | POA: Diagnosis not present

## 2021-08-16 DIAGNOSIS — H04123 Dry eye syndrome of bilateral lacrimal glands: Secondary | ICD-10-CM | POA: Diagnosis not present

## 2021-08-16 DIAGNOSIS — H1045 Other chronic allergic conjunctivitis: Secondary | ICD-10-CM | POA: Diagnosis not present

## 2021-08-16 DIAGNOSIS — M9901 Segmental and somatic dysfunction of cervical region: Secondary | ICD-10-CM | POA: Diagnosis not present

## 2021-08-16 DIAGNOSIS — M9903 Segmental and somatic dysfunction of lumbar region: Secondary | ICD-10-CM | POA: Diagnosis not present

## 2021-08-17 ENCOUNTER — Telehealth: Payer: Self-pay | Admitting: Family Medicine

## 2021-08-17 DIAGNOSIS — G8929 Other chronic pain: Secondary | ICD-10-CM

## 2021-08-17 NOTE — Telephone Encounter (Signed)
done

## 2021-08-17 NOTE — Telephone Encounter (Signed)
REFERRAL REQUEST Telephone Note  Have you been seen at our office for this problem? 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 If they haven't heard anything within a week they should call back and speak with the referral department.

## 2021-08-25 ENCOUNTER — Encounter: Payer: Self-pay | Admitting: Family Medicine

## 2021-08-25 ENCOUNTER — Ambulatory Visit (INDEPENDENT_AMBULATORY_CARE_PROVIDER_SITE_OTHER): Payer: Medicare HMO | Admitting: Family Medicine

## 2021-08-25 VITALS — BP 90/62 | HR 77 | Temp 98.3°F | Resp 20 | Ht 67.0 in | Wt 118.0 lb

## 2021-08-25 DIAGNOSIS — M810 Age-related osteoporosis without current pathological fracture: Secondary | ICD-10-CM

## 2021-08-25 DIAGNOSIS — R69 Illness, unspecified: Secondary | ICD-10-CM | POA: Diagnosis not present

## 2021-08-25 DIAGNOSIS — M5137 Other intervertebral disc degeneration, lumbosacral region: Secondary | ICD-10-CM | POA: Diagnosis not present

## 2021-08-25 DIAGNOSIS — F418 Other specified anxiety disorders: Secondary | ICD-10-CM | POA: Diagnosis not present

## 2021-08-25 DIAGNOSIS — M79675 Pain in left toe(s): Secondary | ICD-10-CM | POA: Diagnosis not present

## 2021-08-25 DIAGNOSIS — M542 Cervicalgia: Secondary | ICD-10-CM | POA: Diagnosis not present

## 2021-08-25 DIAGNOSIS — G479 Sleep disorder, unspecified: Secondary | ICD-10-CM | POA: Diagnosis not present

## 2021-08-25 DIAGNOSIS — G8929 Other chronic pain: Secondary | ICD-10-CM | POA: Diagnosis not present

## 2021-08-25 DIAGNOSIS — M9901 Segmental and somatic dysfunction of cervical region: Secondary | ICD-10-CM | POA: Diagnosis not present

## 2021-08-25 DIAGNOSIS — Z808 Family history of malignant neoplasm of other organs or systems: Secondary | ICD-10-CM

## 2021-08-25 DIAGNOSIS — M9903 Segmental and somatic dysfunction of lumbar region: Secondary | ICD-10-CM | POA: Diagnosis not present

## 2021-08-25 DIAGNOSIS — M9902 Segmental and somatic dysfunction of thoracic region: Secondary | ICD-10-CM | POA: Diagnosis not present

## 2021-08-25 MED ORDER — MIRTAZAPINE 7.5 MG PO TABS: 7.5000 mg | ORAL_TABLET | Freq: Every day | ORAL | 1 refills | Status: DC

## 2021-08-25 NOTE — Progress Notes (Signed)
Subjective: CC: Needs referrals PCP: [name], DO Kristy Garza is a 71 y.o. female presenting to clinic today for:  1.  Family history of melanoma Patient reports that her father died from complications of melanoma.  She would like to see a dermatologist for full body exam.  She has multiple moles but has not really been able to see any big changes in them.  She of course cannot see her back so this does worry her.  2.  Toe pain Patient reports some pain at the joint of the left pinky toe.  Often rubs on to the adjacent toes such that she has to wrap the toes that they do not cause irritation on each other.  She would like to see podiatrist for treatment of this  3.  Neck pain/ low back pain Patient with ongoing chronic neck pain.  She wants to see an orthopedist for this.  She is treated with Zanaflex, tramadol but these do not fully alleviate pain.  She wants to see Dr. Nelva Bush  4.  Sleep difficulties Patient reports ongoing sleep difficulties despite use of melatonin and as needed Ativan.  She wonders if perhaps she would benefit from a more potent benzodiazepine but was not aware that this also poses more danger.  She has been treated with Cymbalta in the past for chronic pain and mood but discontinued that due to intolerance.  Has never been on any other medications for sleep   ROS: Per HPI  Allergies  Allergen Reactions   Cefuroxime Axetil Other (See Comments)    Extreme gas   Gabapentin Other (See Comments)    Constipation Constipation   Montelukast Other (See Comments)    Numbness and tingling in hand. Numbness and tingling in hand. Numbness and tingling in hand. NUMBNESS OF HANDS AND FEET. Numbness in hands and feet NUMBNESS OF HANDS AND FEET.   Pregabalin Other (See Comments)    Drowsiness, dry mouth Drowsiness, dry mouth Makes her tired and thirsty   Augmentin [Amoxicillin-Pot Clavulanate] Diarrhea   Avelox [Moxifloxacin Hcl In Nacl] Other (See  Comments)    Tremors    Biaxin [Clarithromycin]     Unsure of reaction   Cedax [Ceftibuten] Other (See Comments)    tremors   Ciprofloxacin Other (See Comments) and Nausea And Vomiting    Blurred vision Blurred vision Pt can't remember reaction   Clindamycin/Lincomycin     tachycardia   Levofloxacin Other (See Comments)    Feels dehydrated   Lincomycin Other (See Comments)    tachycardia   Montelukast Sodium Other (See Comments)    Numbness and tingling in hand. Numbness and tingling in hand.   Sulfonamide Derivatives Other (See Comments)    Gi upset   Sulfa Antibiotics Nausea Only    Other reaction(s): Other (See Comments) Gi upset  Other reaction(s): Other (See Comments) Gi upset Other reaction(s): Unknown   Past Medical History:  Diagnosis Date   Acid reflux    Allergy    Arthritis    ARTHRITIS IN NECK BY DR. Alroy Dust [facility]   Asthma    Interstitial cystitis    Osteoporosis    PONV (postoperative nausea and vomiting)    Tinnitus    Trigeminal neuralgia    Atypical trigeminal neuralgia    Current Outpatient Medications:    acetaminophen (TYLENOL) 500 MG tablet, Take by mouth., Disp: , Rfl:    albuterol (PROVENTIL) (2.5 MG/3ML) 0.083% nebulizer solution, Take 3 mLs (2.5 mg total) by nebulization  every 4 (four) hours as needed for wheezing or shortness of breath (coughing fits)., Disp: 75 mL, Rfl: 1   albuterol (VENTOLIN HFA) 108 (90 Base) MCG/ACT inhaler, USE 2 PUFFS EVERY 4 HOURS AS NEEDED FOR COUGH, WHEEZE, TIGHTNESS IN CHEST, OR SHORTNESS OF BREATH, Disp: 18 each, Rfl: 1   ascorbic acid (VITAMIN C) 1000 MG tablet, Take by mouth., Disp: , Rfl:    aspirin-acetaminophen-caffeine (EXCEDRIN MIGRAINE) 250-250-65 MG tablet, Take by mouth., Disp: , Rfl:    b complex vitamins capsule, Take 1 capsule by mouth daily., Disp: , Rfl:    budesonide-formoterol (SYMBICORT) 80-4.5 MCG/ACT inhaler, Inhale 2 puffs into the lungs in the morning and at bedtime. with spacer and rinse  mouth afterwards., Disp: 1 each, Rfl: 5   calcium citrate-vitamin D (CITRACAL+D) 315-200 MG-UNIT tablet, Take by mouth., Disp: , Rfl:    Cholecalciferol (VITAMIN D3) 125 MCG (5000 UT) CAPS, Take 5,000 Units by mouth daily., Disp: , Rfl:    cycloSPORINE (RESTASIS) 0.05 % ophthalmic emulsion, PLACE ONE DROP INTO BOTH EYES TWICE A DAY, Disp: , Rfl:    Dermatological Products, Misc. (SUVICORT) EMUL, , Disp: , Rfl:    DHEA 25 MG CAPS, Take by mouth., Disp: , Rfl:    DULoxetine (CYMBALTA) 30 MG capsule, Take 1 capsule (30 mg total) by mouth daily., Disp: 90 capsule, Rfl: 1   famotidine (PEPCID) 20 MG tablet, Take 1 tablet (20 mg total) by mouth 2 (two) times daily as needed for heartburn or indigestion., Disp: 180 tablet, Rfl: 0   hyoscyamine (LEVSIN SL) 0.125 MG SL tablet, Place under the tongue every 4 (four) hours as needed., Disp: , Rfl:    ipratropium (ATROVENT) 0.06 % nasal spray, Place 1-2 sprays into both nostrils 3 (three) times daily as needed (drainage)., Disp: 15 mL, Rfl: 5   lidocaine (XYLOCAINE) 2 % jelly, APPLY TOPICALLY TO AFFECTED AREA EVERY DAY AS NEEDED (use sparingly), Disp: 85 g, Rfl: 0   Lidocaine-Collagen-Aloe Vera (REGENECARE) 2 % GEL, Apply topically daily., Disp: 85 g, Rfl: 2   magnesium oxide (MAG-OX) 400 MG tablet, Take by mouth., Disp: , Rfl:    melatonin 5 MG TABS, Take 5 mg by mouth., Disp: , Rfl:    mometasone (NASONEX) 50 MCG/ACT nasal spray, Place 1 spray into the nose 2 (two) times daily as needed (nasal congestion)., Disp: 1 each, Rfl: 5   Plecanatide (TRULANCE) 3 MG TABS, , Disp: , Rfl:    Polyethylene Glycol 3350 (MIRALAX PO), Take by mouth as needed., Disp: , Rfl:    Probiotic Product (PROBIOTIC PO), Take by mouth daily., Disp: , Rfl:    Simethicone (GAS-X PO), Take by mouth., Disp: , Rfl:    tizanidine (ZANAFLEX) 2 MG capsule, Take 1-2 capsules (2-4 mg total) by mouth 3 (three) times daily as needed for muscle spasms., Disp: 60 capsule, Rfl: 2   traMADol (ULTRAM)  50 MG tablet, Take 1 tablet (50 mg total) by mouth every 12 (twelve) hours as needed., Disp: 30 tablet, Rfl: 1   UNABLE TO FIND, Take 1 capsule by mouth daily. Dv3- vitamin D 3 plus immune support, Disp: , Rfl:   Current Facility-Administered Medications:    zoledronic acid (RECLAST) injection 5 mg, 5 mg, Intravenous, Once, [name], DO Social History   Socioeconomic History   Marital status: Married    Spouse name: [name]   Number of children: 3   Years of education: 12   Highest education level: Some college, no degree  Occupational History   Occupation: CNA    Comment: Retired  Tobacco Use   Smoking status: Never   Smokeless tobacco: Never  Vaping Use   Vaping Use: Never used  Substance and Sexual Activity   Alcohol use: No    Alcohol/week: 0.0 standard drinks    Comment: 01-19-2016 per pt no   Drug use: No    Comment: 01-19-2016 per pt no    Sexual activity: Not Currently    Birth control/protection: Surgical  Other Topics Concern   Not on file  Social History Narrative   Lives with husband.    Social Determinants of Health   Financial Resource Strain: Not on file  Food Insecurity: Not on file  Transportation Needs: Unmet Transportation Needs   Lack of Transportation (Medical): No   Lack of Transportation (Non-Medical): Yes  Physical Activity: Inactive   Days of Exercise per Week: 0 days   Minutes of Exercise per Session: 0 min  Stress: Stress Concern Present   Feeling of Stress : Rather much  Social Connections: Not on file  Intimate Partner Violence: Not on file   Family History  Problem Relation Age of Onset   Hyperlipidemia Mother    Arthritis Mother    Cancer Mother    Lymphoma Mother    Cancer Father    Allergies Father    Allergies Sister    Allergies Sister    Allergic rhinitis Neg Hx    Angioedema Neg Hx    Asthma Neg Hx    Eczema Neg Hx    Immunodeficiency Neg Hx    Urticaria Neg Hx     Objective: Office vital signs  reviewed. BP 90/62   Pulse 77   Temp 98.3 F (36.8 C) (Temporal)   Resp 20   Ht '5\' 7"'$  (1.702 m)   Wt 118 lb (53.5 kg)   SpO2 99%   BMI 18.48 kg/m   Physical Examination:  General: Awake, alert, underweight elderly female, No acute distress HEENT: Sclera white MSK: Ambulating independently.  Gait is normal.  Normal tone.  No gross deformity of the neck. Skin: Multiple pigmented skin lesions Psych: Mood stable but anxious appearing.     08/25/2021   11:07 AM 07/26/2021   10:35 AM 05/03/2021   10:13 AM  Depression screen PHQ 2/9  Decreased Interest 0 0 1  Down, Depressed, Hopeless 0 0 1  PHQ - 2 Score 0 0 2  Altered sleeping 0  1  Tired, decreased energy 0  1  Change in appetite 0  1  Feeling bad or failure about yourself  0  1  Trouble concentrating 0  1  Moving slowly or fidgety/restless 0  1  Suicidal thoughts 0  0  PHQ-9 Score 0  8  Difficult doing work/chores Not difficult at all  Somewhat difficult      08/25/2021   11:07 AM 07/26/2021   10:35 AM 04/01/2021   10:58 AM 03/26/2021    8:14 AM  GAD 7 : Generalized Anxiety Score  Nervous, Anxious, on Edge 0 0 1 0  Control/stop worrying 0 0 0 0  Worry too much - different things 0 0 0 0  Trouble relaxing 0 0 0 0  Restless 0 0 0 0  Easily annoyed or irritable 0 0 0 0  Afraid - awful might happen 0 0 0 0  Total GAD 7 Score 0 0 1 0  Anxiety Difficulty Not difficult at all Not difficult at all Somewhat difficult  Not difficult at all    Assessment/ Plan: 71 y.o. female   Depression with anxiety - Plan: mirtazapine (REMERON) 7.5 MG tablet  Sleep difficulties - Plan: mirtazapine (REMERON) 7.5 MG tablet  Osteoporosis of multiple sites  Neck pain - Plan: Ambulatory referral to Orthopedic Surgery  Toe pain, chronic, left - Plan: Ambulatory referral to Podiatry  Family history of malignant melanoma of skin - Plan: Ambulatory referral to Dermatology  Start mirtazapine 7.5 mg daily.  We will CC our referral coordinator  to determine what facility the request patients will be receiving their infusions add as the one at [facility] has recently been closed  With regards to her referrals, each referral has been placed per her request.  I am not quite sure what they will do about that chronic left toe pain other than use of corn pads and or moleskin as I directed.  May consider some injection therapy if this is felt to be arthritic in nature  No orders of the defined types were placed in this encounter.  No orders of the defined types were placed in this encounter.    [name], DO [place] [phone]

## 2021-08-25 NOTE — Patient Instructions (Signed)
Marguerite Olea PA Dermatologist [address] [place], [place] [phone]

## 2021-08-26 ENCOUNTER — Ambulatory Visit: Payer: Medicare HMO | Attending: Family Medicine | Admitting: Physical Therapy

## 2021-08-26 ENCOUNTER — Encounter: Payer: Self-pay | Admitting: Physical Therapy

## 2021-08-26 DIAGNOSIS — M542 Cervicalgia: Secondary | ICD-10-CM | POA: Insufficient documentation

## 2021-08-26 DIAGNOSIS — G8929 Other chronic pain: Secondary | ICD-10-CM | POA: Diagnosis not present

## 2021-08-26 DIAGNOSIS — R293 Abnormal posture: Secondary | ICD-10-CM | POA: Insufficient documentation

## 2021-08-26 DIAGNOSIS — R6884 Jaw pain: Secondary | ICD-10-CM | POA: Diagnosis not present

## 2021-08-26 NOTE — Therapy (Signed)
[facility] [place], [place], [address] Phone: [phone]   Fax:  [phone]  Physical Therapy Evaluation  Patient Details  Name: Kristy Garza MRN: [id number] Date of Birth: 1950-05-28 Referring Provider (PT): Ronnie Doss DO   Encounter Date: 08/26/2021   PT End of Session - 08/26/21 1442     Visit Number 1    Number of Visits 12    Date for PT Re-Evaluation 11/24/21    Authorization Type FOTO AT LEAST EVERY 5TH VISIT.  PROGRESS NOTE AT 10TH VISIT.  KX MODIFIER AFTER 15 VISITS.    PT Start Time 0100    PT Stop Time 0200    PT Time Calculation (min) 60 min    Activity Tolerance Patient tolerated treatment well    Behavior During Therapy WFL for tasks assessed/performed             Past Medical History:  Diagnosis Date   Abdominal pain 01/16/2013   Acid reflux    Allergy    Arthritis    ARTHRITIS IN NECK BY DR. Alroy Dust [facility]   Asthma    Interstitial cystitis    Osteoporosis    PONV (postoperative nausea and vomiting)    Tinnitus    Trigeminal neuralgia    Atypical trigeminal neuralgia    Past Surgical History:  Procedure Laterality Date   ABDOMINAL HYSTERECTOMY  2000   CYSTOSCOPY WITH HYDRODISTENSION AND BIOPSY N/A 06/11/2012   Procedure: CYSTOSCOPY/BIOPSY/HYDRODISTENSION with Instillation of Pyridium and Marcaine and Kenalog;  Surgeon: Ailene Rud, MD;  Location: [place] [facility];  Service: Urology;  Laterality: N/A;  1 hour requested for this case  BLADDER BIOPSY   ETHMOIDECTOMY  2012   SEPTOPLASTY  1980's   vocal cord surgery   1990's   polyp removal    There were no vitals filed for this visit.    Subjective Assessment - 08/26/21 1336     Subjective The patient presents to the clinic wiht c/o chronic neck pain that has been ongoing for ~10 years or more.  She has had Chiropratic care but is not finding it helpful.  Hr pain is rated at a 6/10 today and she has  headaches daily.  OTC pain patches have helped decreases her pain.  Look down and washing her hair increases her pain.    Pertinent History H/o TMJ pain, OP.    How long can you sit comfortably? Okay if aware of posture.    Patient Stated Goals Get out of pain.  Not have headaches.    Currently in Pain? Yes    Pain Score 6     Pain Location Neck    Pain Orientation Right;Left    Pain Descriptors / Indicators Sharp    Pain Type Chronic pain    Pain Onset More than a month ago    Pain Frequency Constant    Aggravating Factors  See above.    Pain Relieving Factors See above.                [facility] PT Assessment - 08/26/21 0001       Assessment   Medical Diagnosis Neck pain    Referring Provider (PT) Ronnie Doss DO    Onset Date/Surgical Date --   Many years.     Precautions   Precautions --   OP.     Restrictions   Weight Bearing Restrictions No      Balance Screen   Has the patient  fallen in the past 6 months No    Has the patient had a decrease in activity level because of a fear of falling?  Yes    Is the patient reluctant to leave their home because of a fear of falling?  No      Home Environment   Living Environment Private residence      Prior Function   Level of Independence Independent      Observation/Other Assessments   Focus on Therapeutic Outcomes (FOTO)  Complete.      Posture/Postural Control   Posture/Postural Control Postural limitations    Postural Limitations Rounded Shoulders;Forward head      Deep Tendon Reflexes   DTR Assessment Site Biceps;Brachioradialis;Triceps    Biceps DTR 2+    Brachioradialis DTR 2+    Triceps DTR 2+      ROM / Strength   AROM / PROM / Strength AROM;Strength      AROM   Overall AROM Comments Bilateral active cervical rotation is 65 degrees.      Strength   Overall Strength Comments Normal bilateral UE strength.      Palpation   Palpation comment Tender to palpation over bilateral suboccipital and upper  cervical paraspinal musculature with increase tone and trigger points over bilateral UT's.                        Objective measurements completed on examination: See above findings.       [facility] Adult PT Treatment/Exercise - 08/26/21 0001       Modalities   Modalities Electrical Stimulation;Moist Heat      Moist Heat Therapy   Number Minutes Moist Heat 20 Minutes    Moist Heat Location Cervical      Electrical Stimulation   Electrical Stimulation Location Bilateral cervical/UT's.    Electrical Stimulation Action IFC at 80-150 Hz.    Electrical Stimulation Parameters 40% scan x 20 minutes.    Electrical Stimulation Goals Pain;Tone      Manual Therapy   Manual Therapy Soft tissue mobilization    Soft tissue mobilization In supine:  STW/M x 8 minutes to patient's bilateral upper cervical and suboccipital region to decrease tone and pain.                     PT Education - 08/26/21 1444     Education Details Chin tucks and cervical extension, also performed with a towel for stability.    Person(s) Educated Patient    Methods Explanation;Demonstration    Comprehension Verbalized understanding;Returned demonstration                 PT Long Term Goals - 08/26/21 1451       PT LONG TERM GOAL #1   Title be independent in advanced HEP    Time 6    Period Weeks    Status New      PT LONG TERM GOAL #2   Title Increase active cervical rotation to 75 degrees+ so patient can turn head more easily while driving.    Time 6    Period Weeks    Status New      PT LONG TERM GOAL #3   Title Eliminate headaches.    Time 6    Period Weeks    Status New      PT LONG TERM GOAL #4   Title Perform ADL's with neck pain not > 3/10.    Time  6    Period Weeks    Status New                    Plan - 08/26/21 1444     Clinical Impression Statement The patient presents to OPPT with a long h/o neck pain.  She reports having headaches daily.   She has normal UE DTR's and strength.  She has some limitations of bilateral active cervical rotation.  She was palpably tender over bilateral suboccipital and upper cervical paraspinal musculature and increased tone her bilateral UT's.  Performing certain ADL's increases her pain significantly.  Patient will benefit from skilled physical therapy intervention to address pain and deficits.    Personal Factors and Comorbidities Comorbidity 1;Other    Comorbidities H/o TMJ pain, OP.    Examination-Activity Limitations Other;Hygiene/Grooming    Examination-Participation Restrictions Other    Stability/Clinical Decision Making Stable/Uncomplicated    Clinical Decision Making Low    Rehab Potential Excellent    PT Frequency 2x / week    PT Duration 6 weeks    PT Treatment/Interventions ADLs/Self Care Home Management;Cryotherapy;Electrical Stimulation;Ultrasound;Moist Heat;Therapeutic activities;Therapeutic exercise;Manual techniques;Patient/family education;Passive range of motion;Dry needling    PT Next Visit Plan Combo e'stim/US; STW/M including suboccipital release, postural exercises.    Consulted and Agree with Plan of Care Patient             Patient will benefit from skilled therapeutic intervention in order to improve the following deficits and impairments:  Pain, Postural dysfunction, Decreased activity tolerance, Decreased range of motion, Increased muscle spasms  Visit Diagnosis: Cervicalgia - Plan: PT plan of care cert/re-cert  Abnormal posture - Plan: PT plan of care cert/re-cert     Problem List Patient Active Problem List   Diagnosis Date Noted   Recurrent infections 03/15/2021   Moderate persistent asthma without complication 50/11/3816   Raynaud phenomenon 06/16/2020   Tear film insufficiency 06/16/2020   Unspecified behavioral syndromes associated with physiological disturbances and physical factors 06/16/2020   Inflammatory arthritis 06/16/2020   Iron deficiency  anemia, unspecified 04/17/2020   Age-related osteoporosis without current pathological fracture 01/04/2019   History of adenomatous polyp of colon 07/02/2018   Malnutrition of moderate degree ([place]) 05/23/2018   Adult BMI <19 kg/sq m 04/13/2018   Incontinence of feces 04/12/2018   Anxiety state 01/19/2016   Adjustment disorder with anxious mood 01/19/2016   HLD (hyperlipidemia) 01/07/2016   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   Chronic constipation 06/22/2015   Chronic bronchitis ([place]) 05/07/2015   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   Trigeminal neuralgia 04/17/2014   Face pain 01/22/2014   Bruxism, sleep-related 08/28/2013   Asthma, chronic 05/13/2013   Generalized anxiety disorder 05/13/2013   Nonallergic rhinitis 05/13/2013   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 [place], [place], [address] Phone: [phone]   Fax:  [phone]  Name: ELEANORE JUNIO MRN: [id number] Date of Birth: 31-Jul-1950

## 2021-08-27 ENCOUNTER — Ambulatory Visit: Payer: Medicare HMO | Admitting: Family Medicine

## 2021-09-01 ENCOUNTER — Encounter: Payer: Self-pay | Admitting: Physical Therapy

## 2021-09-01 ENCOUNTER — Ambulatory Visit: Payer: Medicare HMO | Admitting: Physical Therapy

## 2021-09-01 DIAGNOSIS — R293 Abnormal posture: Secondary | ICD-10-CM | POA: Diagnosis not present

## 2021-09-01 DIAGNOSIS — M542 Cervicalgia: Secondary | ICD-10-CM | POA: Diagnosis not present

## 2021-09-01 DIAGNOSIS — G8929 Other chronic pain: Secondary | ICD-10-CM | POA: Diagnosis not present

## 2021-09-01 DIAGNOSIS — R6884 Jaw pain: Secondary | ICD-10-CM | POA: Diagnosis not present

## 2021-09-01 NOTE — Therapy (Signed)
[facility] [place], [place], [address] Phone: [phone]   Fax:  [phone]  Physical Therapy Treatment  Patient Details  Name: Kristy Garza MRN: [id number] Date of Birth: 05/17/1950 Referring Provider (PT): Ronnie Doss DO   Encounter Date: 09/01/2021   PT End of Session - 09/01/21 1038     Visit Number 2    Number of Visits 12    Date for PT Re-Evaluation 11/24/21    Authorization Type FOTO AT LEAST EVERY 5TH VISIT.  PROGRESS NOTE AT 10TH VISIT.  KX MODIFIER AFTER 15 VISITS.    PT Start Time 1038    PT Stop Time 1117    PT Time Calculation (min) 39 min    Activity Tolerance Patient tolerated treatment well    Behavior During Therapy WFL for tasks assessed/performed             Past Medical History:  Diagnosis Date   Abdominal pain 01/16/2013   Acid reflux    Allergy    Arthritis    ARTHRITIS IN NECK BY DR. Alroy Dust [facility]   Asthma    Interstitial cystitis    Osteoporosis    PONV (postoperative nausea and vomiting)    Tinnitus    Trigeminal neuralgia    Atypical trigeminal neuralgia    Past Surgical History:  Procedure Laterality Date   ABDOMINAL HYSTERECTOMY  2000   CYSTOSCOPY WITH HYDRODISTENSION AND BIOPSY N/A 06/11/2012   Procedure: CYSTOSCOPY/BIOPSY/HYDRODISTENSION with Instillation of Pyridium and Marcaine and Kenalog;  Surgeon: Ailene Rud, MD;  Location: [facility];  Service: Urology;  Laterality: N/A;  1 hour requested for this case  BLADDER BIOPSY   ETHMOIDECTOMY  2012   SEPTOPLASTY  1980's   vocal cord surgery   1990's   polyp removal    There were no vitals filed for this visit.   Subjective Assessment - 09/01/21 1037     Subjective States that she feels tightness along bottom of her skull. Has used Salonpas patches for pain. States that she may need new pillow and mattress.    Pertinent History H/o TMJ pain, OP.    How long can you sit comfortably?  Okay if aware of posture.    Patient Stated Goals Get out of pain.  Not have headaches.    Currently in Pain? Yes    Pain Score 6     Pain Location Neck    Pain Orientation Right;Left;Proximal    Pain Descriptors / Indicators Tightness;Discomfort    Pain Type Chronic pain    Pain Onset More than a month ago    Pain Frequency Constant                [facility] PT Assessment - 09/01/21 0001       Assessment   Medical Diagnosis Neck pain    Referring Provider (PT) Ronnie Doss DO      Restrictions   Weight Bearing Restrictions No                           [facility] Adult PT Treatment/Exercise - 09/01/21 0001       Modalities   Modalities Electrical Stimulation;Moist Heat;Ultrasound      Moist Heat Therapy   Number Minutes Moist Heat 15 Minutes    Moist Heat Location Cervical      Electrical Stimulation   Electrical Stimulation Location Bilateral cervical/UT's.    Electrical Stimulation Action IFC  Electrical Stimulation Parameters 80-'150hz'$  x15 min    Electrical Stimulation Goals Pain;Tone      Ultrasound   Ultrasound Location B UT    Ultrasound Parameters combo 1.2 w/cm2, 100% x10 min    Ultrasound Goals Pain      Manual Therapy   Manual Therapy Soft tissue mobilization    Soft tissue mobilization STW to B UT, cervical paraspinals, SCM, suboccipital release to reduce tone and pain                          PT Long Term Goals - 08/26/21 1451       PT LONG TERM GOAL #1   Title be independent in advanced HEP    Time 6    Period Weeks    Status New      PT LONG TERM GOAL #2   Title Increase active cervical rotation to 75 degrees+ so patient can turn head more easily while driving.    Time 6    Period Weeks    Status New      PT LONG TERM GOAL #3   Title Eliminate headaches.    Time 6    Period Weeks    Status New      PT LONG TERM GOAL #4   Title Perform ADL's with neck pain not > 3/10.    Time 6    Period Weeks     Status New                   Plan - 09/01/21 1333     Clinical Impression Statement Patient presented in clinic with reports of neck pain and headaches. Patient educated regarding pillows and sleep posiitoning. VCs for posture provided in sitting as patient sits in rounded spine posture, forward shoulders and forward head. Greater tone palpable in L UT and suboccipitials as well. Normal modalities response noted following removal of the modalities. Patient provided DN handout with instruction.    Personal Factors and Comorbidities Comorbidity 1;Other    Comorbidities H/o TMJ pain, OP.    Examination-Activity Limitations Other;Hygiene/Grooming    Examination-Participation Restrictions Other    Stability/Clinical Decision Making Stable/Uncomplicated    Rehab Potential Excellent    PT Frequency 2x / week    PT Duration 6 weeks    PT Treatment/Interventions ADLs/Self Care Home Management;Cryotherapy;Electrical Stimulation;Ultrasound;Moist Heat;Therapeutic activities;Therapeutic exercise;Manual techniques;Patient/family education;Passive range of motion;Dry needling    PT Next Visit Plan Combo e'stim/US; STW/M including suboccipital release, postural exercises.    Consulted and Agree with Plan of Care Patient             Patient will benefit from skilled therapeutic intervention in order to improve the following deficits and impairments:  Pain, Postural dysfunction, Decreased activity tolerance, Decreased range of motion, Increased muscle spasms  Visit Diagnosis: Cervicalgia  Abnormal posture     Problem List Patient Active Problem List   Diagnosis Date Noted   Recurrent infections 03/15/2021   Moderate persistent asthma without complication 93/81/0175   Raynaud phenomenon 06/16/2020   Tear film insufficiency 06/16/2020   Unspecified behavioral syndromes associated with physiological disturbances and physical factors 06/16/2020   Inflammatory arthritis 06/16/2020    Iron deficiency anemia, unspecified 04/17/2020   Age-related osteoporosis without current pathological fracture 01/04/2019   History of adenomatous polyp of colon 07/02/2018   Malnutrition of moderate degree ([place]) 05/23/2018   Adult BMI <19 kg/sq m 04/13/2018   Incontinence of feces 04/12/2018  Anxiety state 01/19/2016   Adjustment disorder with anxious mood 01/19/2016   HLD (hyperlipidemia) 01/07/2016   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   Chronic constipation 06/22/2015   Chronic bronchitis (HCC) 05/07/2015   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   Trigeminal neuralgia 04/17/2014   Face pain 01/22/2014   Bruxism, sleep-related 08/28/2013   Asthma, chronic 05/13/2013   Generalized anxiety disorder 05/13/2013   Nonallergic rhinitis 05/13/2013   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

## 2021-09-01 NOTE — Patient Instructions (Signed)
[facility]  DRY NEEDLING CONSENT FORM   Trigger point dry needling is a physical therapy approach to treat Myofascial Pain and Dysfunction.  Dry Needling (DN) is a valuable and effective way to deactivate myofascial trigger points (muscle knots). It is skilled intervention that uses a thin filiform needle to penetrate the skin and stimulate underlying myofascial trigger points, muscular, and connective tissues for the management of neuromusculoskeletal pain and movement impairments.  A local twitch response (LTR) will be elicited.  This can sometimes feel like a deep ache in the muscle during the procedure. Multiple trigger points in multiple muscles can be treated during each treatment.  No medication of any kind is injected.   As with any medical treatment and procedure, there are possible adverse events.  While significant adverse events are uncommon, they do sometimes occur and must be considered prior to giving consent.  1. Dry needling often causes a "post needling soreness".  There can be an increase in pain from a couple of hours to 2-3 days, followed by an improvement in the overall pain state. 2. Any time a needle is used there is a risk of infection.  However, we are using new, sterile, and disposable needles; infections are extremely rare. 3. There is a possibility that you may bleed or bruise.  You may feel tired and some nausea following treatment. 4. There is a rare possibility of a pneumothorax (air in the chest cavity). 5. Allergic reaction to nickel in the stainless steel needle. 6. If a nerve is touched, it may cause paresthesia (a prickling/shock sensation) which is usually brief, but may continue for a couple of days.  Following treatment stay hydrated.  Continue regular activities but not too vigorous initially after treatment for 24-48 hours. You may apply heat to sore muscles.  Dry Needling is best when combined with other physical therapy  interventions such as strengthening, stretching and other therapeutic modalities.     PLEASE ANSWER THE FOLLOWING QUESTIONS:  Do you have a lack of sensation?   Y/N  Do you have a phobia or fear of needles  Y/N  Are you pregnant?    Y/N If yes:  How many weeks? _____  Do you have any implanted devices?  Y/N If yes:  Pacemaker/Spinal Cord         Stimulator/Deep Brain         Stimulator/Insulin          Pump/Other: ____________ Do you have any implants?   Y/N If yes:      Do you take any blood thinners?   Y/N If yes: Coumadin          (Warfarin)/Other:  Do you have a bleeding disorder?   Y/N If yes: What kind:   Do you take any immunosuppressants?  Y/N If yes:   What kind:   Do you take anti-inflammatories?   Y/N If yes: What kind:  Have you ever been diagnosed with Scoliosis? Y/N  Have you had back surgery?    Y/N If yes:         Laminectomy/Fusion/Other:   I have read, or had read to me, the above.  I have had the opportunity to ask any questions.  All of my questions have been answered to my satisfaction and I understand the risks involved with dry needling.  I consent to examination and treatment at [facility], including dry needling, of any and all of my involved and affected   muscles.   

## 2021-09-02 ENCOUNTER — Encounter: Payer: Self-pay | Admitting: Family Medicine

## 2021-09-02 ENCOUNTER — Ambulatory Visit (INDEPENDENT_AMBULATORY_CARE_PROVIDER_SITE_OTHER): Payer: Medicare HMO | Admitting: Family Medicine

## 2021-09-02 VITALS — BP 110/72 | HR 80 | Temp 98.8°F | Ht 67.0 in | Wt 114.0 lb

## 2021-09-02 DIAGNOSIS — R300 Dysuria: Secondary | ICD-10-CM | POA: Diagnosis not present

## 2021-09-02 DIAGNOSIS — G44209 Tension-type headache, unspecified, not intractable: Secondary | ICD-10-CM | POA: Diagnosis not present

## 2021-09-02 LAB — URINALYSIS, ROUTINE W REFLEX MICROSCOPIC
Bilirubin, UA: NEGATIVE
Glucose, UA: NEGATIVE
Ketones, UA: NEGATIVE
Leukocytes,UA: NEGATIVE
Nitrite, UA: NEGATIVE
Protein,UA: NEGATIVE
RBC, UA: NEGATIVE
Specific Gravity, UA: <1.005 — ABNORMAL LOW (ref 1.005–1.030)
Urobilinogen, Ur: 0.2 mg/dL (ref 0.2–1.0)
pH, UA: 6 (ref 5.0–7.5)

## 2021-09-02 MED ORDER — METHYLPREDNISOLONE ACETATE 40 MG/ML IJ SUSP
40.0000 mg | Freq: Once | INTRAMUSCULAR | Status: AC
  Administered 2021-09-02: 60 mg via INTRAMUSCULAR

## 2021-09-02 NOTE — Progress Notes (Signed)
Subjective:  Patient ID: Kristy Garza, female    DOB: 1951/03/05, 71 y.o.   MRN: [id number]  Patient Care Team: [name], DO as PCP - General (Family Medicine) Deneise Lever, MD as Consulting Physician (Pulmonary Disease) Minus Breeding, MD as Consulting Physician (Cardiology) Shea Evans [name], LCSW as Social Worker (Licensed Clinical Social Worker) Ilean China, RN as Case Manager   Chief Complaint:  Headache   HPI: Kristy Garza is a 71 y.o. female presenting on 09/02/2021 for Headache   Pt presents today for evaluation of headache. She has been seeing a chiropractor for her neck and head pain. States she has been using salon pas and CBD ointment with some relief of symptoms. She denies weakness, confusion, visual changes, nausea, vomiting, fever, chills, or nuchal rigidity. She states she is also concerned about a possible UTI. States she has slight dysuria this morning but this has since resolved.   Headache  This is a new problem. The current episode started 1 to 4 weeks ago. The problem occurs intermittently. The problem has been waxing and waning. The pain is located in the Bilateral region. The quality of the pain is described as squeezing and band-like. The pain is mild. Associated symptoms include neck pain. Pertinent negatives include no abdominal pain, abnormal behavior, anorexia, back pain, blurred vision, coughing, dizziness, drainage, ear pain, eye pain, eye redness, eye watering, facial sweating, fever, hearing loss, insomnia, loss of balance, muscle aches, nausea, numbness, phonophobia, photophobia, rhinorrhea, scalp tenderness, seizures, sinus pressure, sore throat, swollen glands, tingling, tinnitus, visual change, vomiting, weakness or weight loss. Nothing aggravates the symptoms. She has tried NSAIDs, acetaminophen, Excedrin and cold packs for the symptoms. The treatment provided mild relief.     Relevant past medical, surgical, family, and  social history reviewed and updated as indicated.  Allergies and medications reviewed and updated. Data reviewed: Chart in Epic.   Past Medical History:  Diagnosis Date   Abdominal pain 01/16/2013   Acid reflux    Allergy    Arthritis    ARTHRITIS IN NECK BY DR. Alroy Dust [facility]   Asthma    Interstitial cystitis    Osteoporosis    PONV (postoperative nausea and vomiting)    Tinnitus    Trigeminal neuralgia    Atypical trigeminal neuralgia    Past Surgical History:  Procedure Laterality Date   ABDOMINAL HYSTERECTOMY  2000   CYSTOSCOPY WITH HYDRODISTENSION AND BIOPSY N/A 06/11/2012   Procedure: CYSTOSCOPY/BIOPSY/HYDRODISTENSION with Instillation of Pyridium and Marcaine and Kenalog;  Surgeon: Ailene Rud, MD;  Location: [facility];  Service: Urology;  Laterality: N/A;  1 hour requested for this case  BLADDER BIOPSY   ETHMOIDECTOMY  2012   SEPTOPLASTY  1980's   vocal cord surgery   1990's   polyp removal    Social History   Socioeconomic History   Marital status: Married    Spouse name: [name]   Number of children: 3   Years of education: 12   Highest education level: Some college, no degree  Occupational History   Occupation: CNA    Comment: Retired  Tobacco Use   Smoking status: Never   Smokeless tobacco: Never  Vaping Use   Vaping Use: Never used  Substance and Sexual Activity   Alcohol use: No    Alcohol/week: 0.0 standard drinks of alcohol    Comment: 01-19-2016 per pt no   Drug use: No    Comment: 01-19-2016 per pt  no    Sexual activity: Not Currently    Birth control/protection: Surgical  Other Topics Concern   Not on file  Social History Narrative   Lives with husband.    Social Determinants of Health   Financial Resource Strain: Not on file  Food Insecurity: Not on file  Transportation Needs: Unmet Transportation Needs (03/08/2021)   PRAPARE - Transportation    Lack of Transportation (Medical): No    Lack of  Transportation (Non-Medical): Yes  Physical Activity: Inactive (05/03/2021)   Exercise Vital Sign    Days of Exercise per Week: 0 days    Minutes of Exercise per Session: 0 min  Stress: Stress Concern Present (05/03/2021)   [place]    Feeling of Stress : Rather much  Social Connections: Somewhat Isolated (09/07/2017)   Social Connection and Isolation Panel [NHANES]    Frequency of Communication with Friends and Family: More than three times a week    Frequency of Social Gatherings with Friends and Family: More than three times a week    Attends Religious Services: Never    Marine scientist or Organizations: No    Attends Archivist Meetings: Never    Marital Status: Married  Human resources officer Violence: Not on file    Outpatient Encounter Medications as of 09/02/2021  Medication Sig   acetaminophen (TYLENOL) 500 MG tablet Take by mouth.   albuterol (PROVENTIL) (2.5 MG/3ML) 0.083% nebulizer solution Take 3 mLs (2.5 mg total) by nebulization every 4 (four) hours as needed for wheezing or shortness of breath (coughing fits).   albuterol (VENTOLIN HFA) 108 (90 Base) MCG/ACT inhaler USE 2 PUFFS EVERY 4 HOURS AS NEEDED FOR COUGH, WHEEZE, TIGHTNESS IN CHEST, OR SHORTNESS OF BREATH   ascorbic acid (VITAMIN C) 1000 MG tablet Take by mouth.   aspirin-acetaminophen-caffeine (EXCEDRIN MIGRAINE) 250-250-65 MG tablet Take by mouth.   azelastine (ASTELIN) 0.1 % nasal spray Place into both nostrils 2 (two) times daily. Use in each nostril as directed   b complex vitamins capsule Take 1 capsule by mouth daily.   budesonide-formoterol (SYMBICORT) 80-4.5 MCG/ACT inhaler Inhale 2 puffs into the lungs in the morning and at bedtime. with spacer and rinse mouth afterwards.   calcium citrate-vitamin D (CITRACAL+D) 315-200 MG-UNIT tablet Take by mouth.   Cholecalciferol (VITAMIN D3) 125 MCG (5000 UT) CAPS Take 5,000 Units by mouth  daily.   cycloSPORINE (RESTASIS) 0.05 % ophthalmic emulsion    Dermatological Products, Misc. (SUVICORT) EMUL    DHEA 25 MG CAPS Take by mouth.   hyoscyamine (LEVSIN SL) 0.125 MG SL tablet Place under the tongue every 4 (four) hours as needed.   ipratropium (ATROVENT) 0.06 % nasal spray Place 1-2 sprays into both nostrils 3 (three) times daily as needed (drainage).   lidocaine (XYLOCAINE) 2 % jelly APPLY TOPICALLY TO AFFECTED AREA EVERY DAY AS NEEDED (use sparingly)   Lidocaine-Collagen-Aloe Vera (REGENECARE) 2 % GEL Apply topically daily.   magnesium oxide (MAG-OX) 400 MG tablet Take by mouth.   melatonin 5 MG TABS Take 5 mg by mouth.   mirtazapine (REMERON) 7.5 MG tablet Take 1 tablet (7.5 mg total) by mouth at bedtime.   mometasone (NASONEX) 50 MCG/ACT nasal spray Place 1 spray into the nose 2 (two) times daily as needed (nasal congestion).   Polyethylene Glycol 3350 (MIRALAX PO) Take by mouth as needed.   Probiotic Product (PROBIOTIC PO) Take by mouth daily.   Simethicone (GAS-X PO) Take  by mouth.   tizanidine (ZANAFLEX) 2 MG capsule Take 1-2 capsules (2-4 mg total) by mouth 3 (three) times daily as needed for muscle spasms.   traMADol (ULTRAM) 50 MG tablet Take 1 tablet (50 mg total) by mouth every 12 (twelve) hours as needed.   UNABLE TO FIND Take 1 capsule by mouth daily. Dv3- vitamin D 3 plus immune support   Facility-Administered Encounter Medications as of 09/02/2021  Medication   zoledronic acid (RECLAST) injection 5 mg    Allergies  Allergen Reactions   Cefuroxime Axetil Other (See Comments)    Extreme gas   Gabapentin Other (See Comments)    Constipation Constipation   Montelukast Other (See Comments)    Numbness and tingling in hand. Numbness and tingling in hand. Numbness and tingling in hand. NUMBNESS OF HANDS AND FEET. Numbness in hands and feet NUMBNESS OF HANDS AND FEET.   Pregabalin Other (See Comments)    Drowsiness, dry mouth Drowsiness, dry mouth Makes  her tired and thirsty   Augmentin [Amoxicillin-Pot Clavulanate] Diarrhea   Avelox [Moxifloxacin Hcl In Nacl] Other (See Comments)    Tremors    Biaxin [Clarithromycin]     Unsure of reaction   Cedax [Ceftibuten] Other (See Comments)    tremors   Ciprofloxacin Other (See Comments) and Nausea And Vomiting    Blurred vision Blurred vision Pt can't remember reaction   Clindamycin/Lincomycin     tachycardia   Levofloxacin Other (See Comments)    Feels dehydrated   Lincomycin Other (See Comments)    tachycardia   Montelukast Sodium Other (See Comments)    Numbness and tingling in hand. Numbness and tingling in hand.   Sulfonamide Derivatives Other (See Comments)    Gi upset   Sulfa Antibiotics Nausea Only    Other reaction(s): Other (See Comments) Gi upset  Other reaction(s): Other (See Comments) Gi upset Other reaction(s): Unknown    Review of Systems  Constitutional:  Negative for activity change, appetite change, chills, diaphoresis, fatigue, fever, unexpected weight change and weight loss.  HENT: Negative.  Negative for ear pain, hearing loss, rhinorrhea, sinus pressure, sore throat and tinnitus.   Eyes: Negative.  Negative for blurred vision, photophobia, pain, redness and visual disturbance.  Respiratory:  Negative for cough, chest tightness and shortness of breath.   Cardiovascular:  Negative for chest pain, palpitations and leg swelling.  Gastrointestinal:  Negative for abdominal pain, anorexia, blood in stool, constipation, diarrhea, nausea and vomiting.  Endocrine: Negative.  Negative for cold intolerance, heat intolerance, polydipsia, polyphagia and polyuria.  Genitourinary:  Positive for dysuria. Negative for decreased urine volume, difficulty urinating, dyspareunia, enuresis, flank pain, frequency, genital sores, hematuria, menstrual problem, pelvic pain, urgency, vaginal bleeding, vaginal discharge and vaginal pain.  Musculoskeletal:  Positive for neck pain. Negative  for arthralgias, back pain, gait problem, joint swelling, myalgias and neck stiffness.  Skin: Negative.   Allergic/Immunologic: Negative.   Neurological:  Positive for headaches. Negative for dizziness, tingling, tremors, seizures, syncope, facial asymmetry, speech difficulty, weakness, light-headedness, numbness and loss of balance.  Hematological: Negative.   Psychiatric/Behavioral:  Negative for confusion, hallucinations, sleep disturbance and suicidal ideas. The patient does not have insomnia.   All other systems reviewed and are negative.       Objective:  BP 110/72   Pulse 80   Temp 98.8 F (37.1 C)   Ht $R'5\' 7"'Dk$  (1.702 m)   Wt 114 lb (51.7 kg)   SpO2 99%   BMI 17.85 kg/m  Wt Readings from Last 3 Encounters:  09/02/21 114 lb (51.7 kg)  08/25/21 118 lb (53.5 kg)  07/26/21 112 lb 12.8 oz (51.2 kg)    Physical Exam Vitals and nursing note reviewed.  Constitutional:      General: She is not in acute distress.    Appearance: Normal appearance. She is well-developed. She is not ill-appearing, toxic-appearing or diaphoretic.  HENT:     Head: Normocephalic and atraumatic.     Mouth/Throat:     Mouth: Mucous membranes are moist.     Pharynx: Oropharynx is clear.  Eyes:     Extraocular Movements: Extraocular movements intact.     Pupils: Pupils are equal, round, and reactive to light.  Cardiovascular:     Rate and Rhythm: Normal rate and regular rhythm.     Heart sounds: Normal heart sounds. No murmur heard.    No friction rub. No gallop.  Pulmonary:     Effort: Pulmonary effort is normal.     Breath sounds: Normal breath sounds.  Abdominal:     General: Bowel sounds are normal.     Palpations: Abdomen is soft.     Tenderness: There is no right CVA tenderness or left CVA tenderness.  Musculoskeletal:        General: No swelling. Normal range of motion.     Cervical back: Normal range of motion and neck supple.  Skin:    General: Skin is warm and dry.     Capillary  Refill: Capillary refill takes less than 2 seconds.  Neurological:     General: No focal deficit present.     Mental Status: She is alert and oriented to person, place, and time.  Psychiatric:        Mood and Affect: Mood normal.        Speech: Speech normal.        Behavior: Behavior normal.        Thought Content: Thought content normal.        Judgment: Judgment normal.     Results for orders placed or performed in visit on 07/26/21  ToxASSURE Select 13 (MW), Urine  Result Value Ref Range   Summary Note   CMP14+EGFR  Result Value Ref Range   Glucose 92 70 - 99 mg/dL   BUN 6 (L) 8 - 27 mg/dL   Creatinine, Ser 0.74 0.57 - 1.00 mg/dL   eGFR 87 >59 mL/min/1.73   BUN/Creatinine Ratio 8 (L) 12 - 28   Sodium 140 134 - 144 mmol/L   Potassium 4.5 3.5 - 5.2 mmol/L   Chloride 102 96 - 106 mmol/L   CO2 24 20 - 29 mmol/L   Calcium 9.4 8.7 - 10.3 mg/dL   Total Protein 6.8 6.0 - 8.5 g/dL   Albumin 4.6 3.8 - 4.8 g/dL   Globulin, Total 2.2 1.5 - 4.5 g/dL   Albumin/Globulin Ratio 2.1 1.2 - 2.2   Bilirubin Total 0.4 0.0 - 1.2 mg/dL   Alkaline Phosphatase 53 44 - 121 IU/L   AST 23 0 - 40 IU/L   ALT 15 0 - 32 IU/L  Lipid Panel  Result Value Ref Range   Cholesterol, Total 181 100 - 199 mg/dL   Triglycerides 60 0 - 149 mg/dL   HDL 75 >39 mg/dL   VLDL Cholesterol Cal 12 5 - 40 mg/dL   LDL Chol Calc (NIH) 94 0 - 99 mg/dL   Chol/HDL Ratio 2.4 0.0 - 4.4 ratio  CBC  Result Value Ref  Range   WBC 4.6 3.4 - 10.8 x10E3/uL   RBC 4.13 3.77 - 5.28 x10E6/uL   Hemoglobin 12.4 11.1 - 15.9 g/dL   Hematocrit 38.1 34.0 - 46.6 %   MCV 92 79 - 97 fL   MCH 30.0 26.6 - 33.0 pg   MCHC 32.5 31.5 - 35.7 g/dL   RDW 11.7 11.7 - 15.4 %   Platelets 211 150 - 450 x10E3/uL  Vitamin B12  Result Value Ref Range   Vitamin B-12 530 232 - 1,245 pg/mL  VITAMIN D 25 Hydroxy (Vit-D Deficiency, Fractures)  Result Value Ref Range   Vit D, 25-Hydroxy 67.1 30.0 - 100.0 ng/mL   *Note: Due to a large number of results  and/or encounters for the requested time period, some results have not been displayed. A complete set of results can be found in Results Review.       Pertinent labs & imaging results that were available during my care of the patient were reviewed by me and considered in my medical decision making.  Assessment & Plan:  Ardel was seen today for headache.  Diagnoses and all orders for this visit:  Tension headache Classic tension headache with no red flags present. Will dose with depo-medrol in office today. Symptomatic care at home discussed in detail. Report any new, worsening, or persistent symptoms.   Dysuria Urinalysis unremarkable. Avoid bladder irritants such as caffeine.  -     Urinalysis, Routine w reflex microscopic     Continue all other maintenance medications.  Follow up plan: Return if symptoms worsen or fail to improve.   Continue healthy lifestyle choices, including diet (rich in fruits, vegetables, and lean proteins, and low in salt and simple carbohydrates) and exercise (at least 30 minutes of moderate physical activity daily).  Educational handout given for tension headache  The above assessment and management plan was discussed with the patient. The patient verbalized understanding of and has agreed to the management plan. Patient is aware to call the clinic if they develop any new symptoms or if symptoms persist or worsen. Patient is aware when to return to the clinic for a follow-up visit. Patient educated on when it is appropriate to go to the emergency department.   Monia Pouch, FNP-C [facility] [phone]

## 2021-09-13 ENCOUNTER — Encounter: Payer: Medicare HMO | Admitting: Physical Therapy

## 2021-09-15 DIAGNOSIS — J029 Acute pharyngitis, unspecified: Secondary | ICD-10-CM | POA: Diagnosis not present

## 2021-09-15 DIAGNOSIS — R059 Cough, unspecified: Secondary | ICD-10-CM | POA: Diagnosis not present

## 2021-09-15 DIAGNOSIS — R309 Painful micturition, unspecified: Secondary | ICD-10-CM | POA: Diagnosis not present

## 2021-09-15 DIAGNOSIS — Z20822 Contact with and (suspected) exposure to covid-19: Secondary | ICD-10-CM | POA: Diagnosis not present

## 2021-09-15 NOTE — Progress Notes (Deleted)
Follow Up Note  RE: Kristy Garza MRN: [id number] DOB: [birth year] Date of Office Visit: 09/16/2021  Referring provider: [name], DO Primary care provider: [name], DO  Chief Complaint: No chief complaint on file.  History of Present Illness: I had the pleasure of seeing Kristy Garza for a follow up visit at the [facility] on 09/15/2021. She is a 71 y.o. female, who is being followed for nonallergic rhinitis, asthma, GERD, recurrent infections. Her previous allergy office visit was on 05/27/2021 with Dr. Maudie Mercury. Today is a regular follow up visit.  Nonallergic rhinitis Past history - 2022 bloodwork negative to environmental allergy panel. ENT evaluation unremarkable (patient declined laryngoscopy) Interim history - still having symptoms and wants to get allergy skin testing done today.  Today's skin testing showed: Negative to indoor/outdoor allergies and common foods.  Discussed with patient that she can have these symptoms and not have environmental allergies.  Use Nasacort (triamcinolone) or Nasonex nasal spray 1 spray per nostril twice a day as needed for nasal congestion.  Use Atrovent (ipratropium) 0.63% 1-2 sprays per nostril three a day as needed for runny nose/drainage. Nasal saline spray (i.e., Simply Saline) or nasal saline lavage (i.e., NeilMed) is recommended as needed and prior to medicated nasal sprays.   Moderate persistent asthma without complication Past history - Breo caused thrush. 2022 spirometry showed: normal pattern with 7% improvement in FEV1 post bronchodilator treatment. Clinically feeling much better. Covid-19 in Nov 2022. Interim history - doing better with Symbicort but can only tolerate 1 puff BID. No additional prednisone since the last visit. Some coughing in the mornings and uses albuterol twice per week with good benefit. No recent neb use. Today's spirometry was normal.  Daily controller medication(s): Symbicort  36mg 1 puff twice a day with spacer and rinse mouth afterwards. During upper respiratory infections/asthma flares:  Start budesonide 0.'5mg'$  nebulizer twice a day for 1-2 weeks until your breathing symptoms return to baseline.  Pretreat with albuterol 2 puffs or albuterol nebulizer.  If you need to use your albuterol nebulizer machine back to back within 15-30 minutes with no relief then please go to the ER/urgent care for further evaluation.  May use albuterol rescue inhaler 2 puffs or nebulizer every 4 to 6 hours as needed for shortness of breath, chest tightness, coughing, and wheezing. May use albuterol rescue inhaler 2 puffs 5 to 15 minutes prior to strenuous physical activities. Monitor frequency of use.  Get spirometry at next visit.   Gastroesophageal reflux disease Stopped PPI with no worsening symptoms.  Continue dietary and lifestyle modifications as listed below Monitor symptoms.    Recurrent infections Interim history - bloodwork (Immunoglobulin levels, pneumococcal titer, tetanus/diptheria titer) normal. Keep track of infections and antibiotics use.   Return in about 3 months (around 08/27/2021). Advised patient to bring a copy of her allergy bloodwork that she had done at an integrative medicine clinic.   Assessment and Plan: [name] a 71y.o. female with: No problem-specific Assessment & Plan notes found for this encounter.  No follow-ups on file.  No orders of the defined types were placed in this encounter.  Lab Orders  No laboratory test(s) ordered today    Diagnostics: Spirometry:  Tracings reviewed. Her effort: {Blank single:19197::"Good reproducible efforts.","It was hard to get consistent efforts and there is a question as to whether this reflects a maximal maneuver.","Poor effort, data can not be interpreted."} FVC: ***L FEV1: ***L, ***% predicted FEV1/FVC ratio: ***%  Interpretation: {Blank single:19197::"Spirometry consistent with mild obstructive  disease","Spirometry consistent with moderate obstructive disease","Spirometry consistent with severe obstructive disease","Spirometry consistent with possible restrictive disease","Spirometry consistent with mixed obstructive and restrictive disease","Spirometry uninterpretable due to technique","Spirometry consistent with normal pattern","No overt abnormalities noted given today's efforts"}.  Please see scanned spirometry results for details.  Skin Testing: {Blank single:19197::"Select foods","Environmental allergy panel","Environmental allergy panel and select foods","Food allergy panel","None","Deferred due to recent antihistamines use"}. *** Results discussed with patient/family.   Medication List:  Current Outpatient Medications  Medication Sig Dispense Refill   acetaminophen (TYLENOL) 500 MG tablet Take by mouth.     albuterol (PROVENTIL) (2.5 MG/3ML) 0.083% nebulizer solution Take 3 mLs (2.5 mg total) by nebulization every 4 (four) hours as needed for wheezing or shortness of breath (coughing fits). 75 mL 1   albuterol (VENTOLIN HFA) 108 (90 Base) MCG/ACT inhaler USE 2 PUFFS EVERY 4 HOURS AS NEEDED FOR COUGH, WHEEZE, TIGHTNESS IN CHEST, OR SHORTNESS OF BREATH 18 each 1   ascorbic acid (VITAMIN C) 1000 MG tablet Take by mouth.     aspirin-acetaminophen-caffeine (EXCEDRIN MIGRAINE) 250-250-65 MG tablet Take by mouth.     azelastine (ASTELIN) 0.1 % nasal spray Place into both nostrils 2 (two) times daily. Use in each nostril as directed     b complex vitamins capsule Take 1 capsule by mouth daily.     budesonide-formoterol (SYMBICORT) 80-4.5 MCG/ACT inhaler Inhale 2 puffs into the lungs in the morning and at bedtime. with spacer and rinse mouth afterwards. 1 each 5   calcium citrate-vitamin D (CITRACAL+D) 315-200 MG-UNIT tablet Take by mouth.     Cholecalciferol (VITAMIN D3) 125 MCG (5000 UT) CAPS Take 5,000 Units by mouth daily.     cycloSPORINE (RESTASIS) 0.05 % ophthalmic emulsion       Dermatological Products, Misc. (SUVICORT) EMUL      DHEA 25 MG CAPS Take by mouth.     hyoscyamine (LEVSIN SL) 0.125 MG SL tablet Place under the tongue every 4 (four) hours as needed.     ipratropium (ATROVENT) 0.06 % nasal spray Place 1-2 sprays into both nostrils 3 (three) times daily as needed (drainage). 15 mL 5   lidocaine (XYLOCAINE) 2 % jelly APPLY TOPICALLY TO AFFECTED AREA EVERY DAY AS NEEDED (use sparingly) 85 g 0   Lidocaine-Collagen-Aloe Vera (REGENECARE) 2 % GEL Apply topically daily. 85 g 2   magnesium oxide (MAG-OX) 400 MG tablet Take by mouth.     melatonin 5 MG TABS Take 5 mg by mouth.     mirtazapine (REMERON) 7.5 MG tablet Take 1 tablet (7.5 mg total) by mouth at bedtime. 90 tablet 1   mometasone (NASONEX) 50 MCG/ACT nasal spray Place 1 spray into the nose 2 (two) times daily as needed (nasal congestion). 1 each 5   Polyethylene Glycol 3350 (MIRALAX PO) Take by mouth as needed.     Probiotic Product (PROBIOTIC PO) Take by mouth daily.     Simethicone (GAS-X PO) Take by mouth.     tizanidine (ZANAFLEX) 2 MG capsule Take 1-2 capsules (2-4 mg total) by mouth 3 (three) times daily as needed for muscle spasms. 60 capsule 2   traMADol (ULTRAM) 50 MG tablet Take 1 tablet (50 mg total) by mouth every 12 (twelve) hours as needed. 30 tablet 1   UNABLE TO FIND Take 1 capsule by mouth daily. Dv3- vitamin D 3 plus immune support     Current Facility-Administered Medications  Medication Dose Route Frequency Provider Last Rate Last Admin  zoledronic acid (RECLAST) injection 5 mg  5 mg Intravenous Once Ronnie Doss M, DO       Allergies: Allergies  Allergen Reactions   Cefuroxime Axetil Other (See Comments)    Extreme gas   Gabapentin Other (See Comments)    Constipation Constipation   Montelukast Other (See Comments)    Numbness and tingling in hand. Numbness and tingling in hand. Numbness and tingling in hand. NUMBNESS OF HANDS AND FEET. Numbness in hands and  feet NUMBNESS OF HANDS AND FEET.   Pregabalin Other (See Comments)    Drowsiness, dry mouth Drowsiness, dry mouth Makes her tired and thirsty   Augmentin [Amoxicillin-Pot Clavulanate] Diarrhea   Avelox [Moxifloxacin Hcl In Nacl] Other (See Comments)    Tremors    Biaxin [Clarithromycin]     Unsure of reaction   Cedax [Ceftibuten] Other (See Comments)    tremors   Ciprofloxacin Other (See Comments) and Nausea And Vomiting    Blurred vision Blurred vision Pt can't remember reaction   Clindamycin/Lincomycin     tachycardia   Levofloxacin Other (See Comments)    Feels dehydrated   Lincomycin Other (See Comments)    tachycardia   Montelukast Sodium Other (See Comments)    Numbness and tingling in hand. Numbness and tingling in hand.   Sulfonamide Derivatives Other (See Comments)    Gi upset   Sulfa Antibiotics Nausea Only    Other reaction(s): Other (See Comments) Gi upset  Other reaction(s): Other (See Comments) Gi upset Other reaction(s): Unknown   I reviewed her past medical history, social history, family history, and environmental history and no significant changes have been reported from her previous visit.  Review of Systems  Constitutional:  Negative for appetite change, chills, fever and unexpected weight change.  HENT:  Positive for postnasal drip and rhinorrhea. Negative for congestion.   Eyes:  Negative for itching.  Respiratory:  Positive for cough. Negative for chest tightness, shortness of breath and wheezing.   Gastrointestinal:  Negative for abdominal pain.  Skin:  Negative for rash.  Allergic/Immunologic: Negative for environmental allergies and food allergies.  Neurological:  Positive for headaches.    Objective: There were no vitals taken for this visit. There is no height or weight on file to calculate BMI. Physical Exam Vitals and nursing note reviewed.  Constitutional:      Appearance: Normal appearance. She is well-developed.  HENT:      Head: Normocephalic and atraumatic.     Right Ear: External ear normal.     Left Ear: External ear normal.     Nose: Nose normal.     Mouth/Throat:     Mouth: Mucous membranes are moist.     Pharynx: Oropharynx is clear.  Eyes:     Conjunctiva/sclera: Conjunctivae normal.  Cardiovascular:     Rate and Rhythm: Normal rate and regular rhythm.     Heart sounds: Normal heart sounds. No murmur heard. Pulmonary:     Effort: Pulmonary effort is normal.     Breath sounds: Normal breath sounds. No wheezing, rhonchi or rales.  Musculoskeletal:     Cervical back: Neck supple.  Skin:    General: Skin is warm.     Findings: No rash.  Neurological:     Mental Status: She is alert and oriented to person, place, and time.  Psychiatric:        Behavior: Behavior normal.    Previous notes and tests were reviewed. The plan was reviewed with the patient/family, and  all questions/concerned were addressed.  It was my pleasure to see Kristy Garza today and participate in her care. Please feel free to contact me with any questions or concerns.  Sincerely,  Rexene Alberts, DO Allergy & Immunology  [facility] office: [place] office: [phone]

## 2021-09-16 ENCOUNTER — Ambulatory Visit: Payer: Medicare HMO | Admitting: Physical Therapy

## 2021-09-16 ENCOUNTER — Telehealth: Payer: Self-pay | Admitting: Allergy

## 2021-09-16 ENCOUNTER — Ambulatory Visit: Payer: Medicare HMO | Admitting: Allergy

## 2021-09-16 DIAGNOSIS — G8929 Other chronic pain: Secondary | ICD-10-CM | POA: Diagnosis not present

## 2021-09-16 DIAGNOSIS — R293 Abnormal posture: Secondary | ICD-10-CM | POA: Diagnosis not present

## 2021-09-16 DIAGNOSIS — R6884 Jaw pain: Secondary | ICD-10-CM | POA: Diagnosis not present

## 2021-09-16 DIAGNOSIS — M542 Cervicalgia: Secondary | ICD-10-CM | POA: Diagnosis not present

## 2021-09-16 NOTE — Telephone Encounter (Signed)
I called the patient and went over the previous message by Dr. Maudie Mercury. The patient verbalized understanding and did not have any other questions and concerns.

## 2021-09-16 NOTE — Telephone Encounter (Signed)
Please call patient.  Okay to take afrin with nasonex for 3-5 days for severe nasal congestion as needed.  Take mucinex twice a day with plenty of water to loosen the mucous.   She can take sudafed for a few days for the congestion.  Just keep an eye on blood pressure as decongestants can raise it.  Thank you.

## 2021-09-16 NOTE — Telephone Encounter (Signed)
Pt was seen yesterday at Urgent Care & was told she has a viral infection. Patient was given a steroid, she feels congested in her head and has a thick but clear mucus that is coming out. Patient would like to know if she can use afrin with nasonex and if she can take sudafed - pt states she was not given an antibiotic  Please advise  Best contact number: [phone]

## 2021-09-16 NOTE — Therapy (Signed)
[facility] [place], [place], [address] Phone: [phone]   Fax:  [phone]  Physical Therapy Treatment  Patient Details  Name: Kristy Garza MRN: [id number] Date of Birth: 09/21/1950 Referring Provider (PT): Ronnie Doss DO   Encounter Date: 09/16/2021   PT End of Session - 09/16/21 1644     Visit Number 3    Number of Visits 12    Date for PT Re-Evaluation 11/24/21    Authorization Type FOTO AT LEAST EVERY 5TH VISIT.  PROGRESS NOTE AT 10TH VISIT.  KX MODIFIER AFTER 15 VISITS.    PT Start Time 0350    PT Stop Time 0446    PT Time Calculation (min) 56 min    Activity Tolerance Patient tolerated treatment well    Behavior During Therapy WFL for tasks assessed/performed             Past Medical History:  Diagnosis Date   Abdominal pain 01/16/2013   Acid reflux    Allergy    Arthritis    ARTHRITIS IN NECK BY DR. Alroy Dust [facility]   Asthma    Interstitial cystitis    Osteoporosis    PONV (postoperative nausea and vomiting)    Tinnitus    Trigeminal neuralgia    Atypical trigeminal neuralgia    Past Surgical History:  Procedure Laterality Date   ABDOMINAL HYSTERECTOMY  2000   CYSTOSCOPY WITH HYDRODISTENSION AND BIOPSY N/A 06/11/2012   Procedure: CYSTOSCOPY/BIOPSY/HYDRODISTENSION with Instillation of Pyridium and Marcaine and Kenalog;  Surgeon: Ailene Rud, MD;  Location: [facility];  Service: Urology;  Laterality: N/A;  1 hour requested for this case  BLADDER BIOPSY   ETHMOIDECTOMY  2012   SEPTOPLASTY  1980's   vocal cord surgery   1990's   polyp removal    There were no vitals filed for this visit.   Subjective Assessment - 09/16/21 1644     Subjective Pain at a 5.    Pertinent History H/o TMJ pain, OP.    How long can you sit comfortably? Okay if aware of posture.    Patient Stated Goals Get out of pain.  Not have headaches.    Currently in Pain? Yes    Pain Score  5     Pain Location Neck    Pain Orientation Right;Left    Pain Descriptors / Indicators Discomfort                               [facility] Adult PT Treatment/Exercise - 09/16/21 0001       Modalities   Modalities Electrical Stimulation;Moist Heat      Moist Heat Therapy   Number Minutes Moist Heat 15 Minutes    Moist Heat Location Cervical      Electrical Stimulation   Electrical Stimulation Location Bilateral cervical.    Electrical Stimulation Action IFC at 80-150 Hz.    Electrical Stimulation Parameters 40% scan x 15 minutes.    Electrical Stimulation Goals Pain;Tone      Ultrasound   Ultrasound Location Bilateral cervical.    Ultrasound Parameters Combo e'stim/US at 1.50 W/CM2 x 12 minutes with small soundhead.    Ultrasound Goals Pain      Manual Therapy   Manual Therapy Soft tissue mobilization    Soft tissue mobilization STW/M x 12 minutes to patient's bilateral cervical musculature to decrease tone.  Trigger Point Dry Needling - 09/16/21 0001     Consent Given? Yes    Education Handout Provided Yes    Muscles Treated Head and Neck Semispinalis capitus   Right side.   Semispinalis capitus Response Twitch reponse elicited                        PT Long Term Goals - 08/26/21 1451       PT LONG TERM GOAL #1   Title be independent in advanced HEP    Time 6    Period Weeks    Status New      PT LONG TERM GOAL #2   Title Increase active cervical rotation to 75 degrees+ so patient can turn head more easily while driving.    Time 6    Period Weeks    Status New      PT LONG TERM GOAL #3   Title Eliminate headaches.    Time 6    Period Weeks    Status New      PT LONG TERM GOAL #4   Title Perform ADL's with neck pain not > 3/10.    Time 6    Period Weeks    Status New                   Plan - 09/16/21 1647     Clinical Impression Statement Patient tolerated dry needling without complaint.   Patient's neck felt better and "less tight" following treatment.    Personal Factors and Comorbidities Comorbidity 1;Other    Comorbidities H/o TMJ pain, OP.    Examination-Activity Limitations Other;Hygiene/Grooming    Examination-Participation Restrictions Other    Stability/Clinical Decision Making Stable/Uncomplicated    Rehab Potential Excellent    PT Frequency 2x / week    PT Duration 6 weeks    PT Treatment/Interventions ADLs/Self Care Home Management;Cryotherapy;Electrical Stimulation;Ultrasound;Moist Heat;Therapeutic activities;Therapeutic exercise;Manual techniques;Patient/family education;Passive range of motion;Dry needling    PT Next Visit Plan Combo e'stim/US; STW/M including suboccipital release, postural exercises.    Consulted and Agree with Plan of Care Patient             Patient will benefit from skilled therapeutic intervention in order to improve the following deficits and impairments:  Pain, Postural dysfunction, Decreased activity tolerance, Decreased range of motion, Increased muscle spasms  Visit Diagnosis: Cervicalgia  Abnormal posture     Problem List Patient Active Problem List   Diagnosis Date Noted   Recurrent infections 03/15/2021   Moderate persistent asthma without complication 45/36/4680   Raynaud phenomenon 06/16/2020   Tear film insufficiency 06/16/2020   Unspecified behavioral syndromes associated with physiological disturbances and physical factors 06/16/2020   Inflammatory arthritis 06/16/2020   Iron deficiency anemia, unspecified 04/17/2020   Age-related osteoporosis without current pathological fracture 01/04/2019   History of adenomatous polyp of colon 07/02/2018   Malnutrition of moderate degree ([place]) 05/23/2018   Adult BMI <19 kg/sq m 04/13/2018   Incontinence of feces 04/12/2018   Anxiety state 01/19/2016   Adjustment disorder with anxious mood 01/19/2016   HLD (hyperlipidemia) 01/07/2016   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   Chronic constipation 06/22/2015   Chronic bronchitis ([place]) 05/07/2015   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   Trigeminal neuralgia 04/17/2014   Face pain 01/22/2014   Bruxism, 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  Macy Lingenfelter, [place], PT 09/16/2021, 4:58 PM  [facility] [address] [place], [place], [address] Phone: [phone]   Fax:  [phone]  Name: Kristy Garza MRN: [id number] Date of Birth: 1951-02-09

## 2021-09-21 ENCOUNTER — Ambulatory Visit (INDEPENDENT_AMBULATORY_CARE_PROVIDER_SITE_OTHER): Payer: Medicare HMO | Admitting: Family Medicine

## 2021-09-21 ENCOUNTER — Ambulatory Visit: Payer: Medicare HMO | Admitting: Physical Therapy

## 2021-09-21 ENCOUNTER — Encounter: Payer: Self-pay | Admitting: Family Medicine

## 2021-09-21 VITALS — BP 106/64 | HR 90 | Temp 98.0°F | Resp 20 | Ht 67.0 in | Wt 113.0 lb

## 2021-09-21 DIAGNOSIS — R293 Abnormal posture: Secondary | ICD-10-CM

## 2021-09-21 DIAGNOSIS — R6884 Jaw pain: Secondary | ICD-10-CM | POA: Diagnosis not present

## 2021-09-21 DIAGNOSIS — G8929 Other chronic pain: Secondary | ICD-10-CM | POA: Diagnosis not present

## 2021-09-21 DIAGNOSIS — J069 Acute upper respiratory infection, unspecified: Secondary | ICD-10-CM | POA: Diagnosis not present

## 2021-09-21 DIAGNOSIS — M542 Cervicalgia: Secondary | ICD-10-CM | POA: Diagnosis not present

## 2021-09-21 MED ORDER — FLUTICASONE PROPIONATE 50 MCG/ACT NA SUSP: 2.0000 | Freq: Every day | NASAL | 6 refills | Status: DC

## 2021-09-21 MED ORDER — GUAIFENESIN ER 600 MG PO TB12: 600.0000 mg | ORAL_TABLET | Freq: Two times a day (BID) | ORAL | 0 refills | Status: AC

## 2021-09-21 MED ORDER — DOXYCYCLINE HYCLATE 100 MG PO TABS: 100.0000 mg | ORAL_TABLET | Freq: Two times a day (BID) | ORAL | 0 refills | Status: AC

## 2021-09-24 ENCOUNTER — Telehealth: Payer: Self-pay | Admitting: Allergy

## 2021-09-24 ENCOUNTER — Other Ambulatory Visit: Payer: Self-pay | Admitting: Family Medicine

## 2021-09-24 ENCOUNTER — Telehealth: Payer: Self-pay | Admitting: Family Medicine

## 2021-09-24 ENCOUNTER — Encounter: Payer: Medicare HMO | Admitting: Physical Therapy

## 2021-09-24 DIAGNOSIS — F411 Generalized anxiety disorder: Secondary | ICD-10-CM

## 2021-09-24 NOTE — Telephone Encounter (Signed)
Patient states she went to her PCP and they diagnosed her with an upper respiratory infection. She was prescribed doxycycline and she's on day 3 but she is not feeling any better. She has a dry cough, congestion, headaches, and her teeth hurt. She's not sure if she is needing a steroid or if she should go to Urgent Care.   Best pharmacy- [facility] in [place] on [address]

## 2021-09-24 NOTE — Telephone Encounter (Signed)
  Incoming Patient Call  09/24/2021  What symptoms do you have? Headache and congestion, teeth hurt, cough  How long have you been sick? 2 weeks   Have you been seen for this problem? 6-27 with Sharyn Lull. Sharyn Lull told patient to let her know if she didn't get any better  If your provider decides to give you a prescription, which pharmacy would you like for it to be sent to? [facility]   Patient informed that this information will be sent to the clinical staff for review and that they should receive a follow up call.

## 2021-09-24 NOTE — Telephone Encounter (Signed)
Was sent abx already 6/27.  Would complete abx before coming into be seen again unless she is getting worse.

## 2021-09-24 NOTE — Telephone Encounter (Signed)
Pt aware.

## 2021-09-24 NOTE — Telephone Encounter (Signed)
Called and informed patient of Anne's recommendation. Patient verbalized understanding.

## 2021-09-24 NOTE — Telephone Encounter (Signed)
Can you please have her go to urgent care? She needs to have someone look and listen to her. Thank you

## 2021-09-24 NOTE — Telephone Encounter (Signed)
Please advise as Dr. Maudie Mercury is out of the office.

## 2021-09-24 NOTE — Telephone Encounter (Signed)
REFERRAL REQUEST Telephone Note  Have you been seen at our office for this problem? yes (Advise that they may need an appointment with their PCP before a referral can be done)  Reason for Referral: Reclast (infusion in her vein) Referral discussed with patient: yes  Best contact number of patient for referral team: [phone]    Has patient been seen by a specialist for this issue before: yes  Patient provider preference for referral: [place]? Patient location preference for referral: [place]   Patient notified that referrals can take up to a week or longer to process. If they haven't heard anything within a week they should call back and speak with the referral department.    It is time for her infusion.

## 2021-09-29 ENCOUNTER — Encounter (HOSPITAL_COMMUNITY): Payer: Medicare HMO

## 2021-09-29 ENCOUNTER — Encounter: Payer: Medicare HMO | Admitting: Physical Therapy

## 2021-10-01 ENCOUNTER — Encounter: Payer: Self-pay | Admitting: Family Medicine

## 2021-10-01 ENCOUNTER — Ambulatory Visit (INDEPENDENT_AMBULATORY_CARE_PROVIDER_SITE_OTHER): Payer: Medicare HMO | Admitting: Family Medicine

## 2021-10-01 VITALS — BP 103/66 | HR 90 | Temp 98.2°F | Ht 67.0 in | Wt 115.8 lb

## 2021-10-01 DIAGNOSIS — J302 Other seasonal allergic rhinitis: Secondary | ICD-10-CM

## 2021-10-01 DIAGNOSIS — J069 Acute upper respiratory infection, unspecified: Secondary | ICD-10-CM

## 2021-10-01 MED ORDER — CETIRIZINE HCL 10 MG PO TABS: 10.0000 mg | ORAL_TABLET | Freq: Every day | ORAL | 11 refills | Status: DC

## 2021-10-01 MED ORDER — METHYLPREDNISOLONE ACETATE 80 MG/ML IJ SUSP
60.0000 mg | Freq: Once | INTRAMUSCULAR | Status: AC
  Administered 2021-10-01: 60 mg via INTRAMUSCULAR

## 2021-10-01 NOTE — Progress Notes (Signed)
Assessment & Plan:  1. URI, acute Encourage symptom management. Recommended humidifier.  - methylPREDNISolone acetate (DEPO-MEDROL) injection 60 mg  2. Seasonal allergies Switching from Allegra to Zyrtec. - cetirizine (ZYRTEC) 10 MG tablet; Take 1 tablet (10 mg total) by mouth daily.  Dispense: 30 tablet; Refill: 11   Follow up plan: Return if symptoms worsen or fail to improve.  Hendricks Limes, MSN, APRN, FNP-C [facility]  Subjective:   Patient ID: Kristy Garza, female    DOB: 1951/01/23, 71 y.o.   MRN: [id number]  HPI: Kristy Garza is a 71 y.o. female presenting on 10/01/2021 for Nasal Congestion, Cough, and Headache (Patient had visit on 6/27 and states she is no better. )  Patient complains of cough, head congestion, headache, sore throat, ear pain/pressure, postnasal drainage, and swollen lymph nodes. Onset of symptoms was 4 weeks ago, unchanged since that time. She is drinking plenty of fluids. Evaluation to date: previously seen twice - once where she was treated symptomatically and then again when she was treated with Doxycycline, Mucinex, and Flonase. Treatment to date: antibiotics, antihistamines, cough suppressants, decongestants, and nasal steroids. She has a history of asthma. She does not smoke. She has tested negative for COVID, influenza, RSV, and strep.   ROS: Negative unless specifically indicated above in HPI.   Relevant past medical history reviewed and updated as indicated.   Allergies and medications reviewed and updated.   Current Outpatient Medications:    acetaminophen (TYLENOL) 500 MG tablet, Take by mouth., Disp: , Rfl:    albuterol (PROVENTIL) (2.5 MG/3ML) 0.083% nebulizer solution, Take 3 mLs (2.5 mg total) by nebulization every 4 (four) hours as needed for wheezing or shortness of breath (coughing fits)., Disp: 75 mL, Rfl: 1   albuterol (VENTOLIN HFA) 108 (90 Base) MCG/ACT inhaler, USE 2 PUFFS EVERY 4 HOURS AS NEEDED FOR  COUGH, WHEEZE, TIGHTNESS IN CHEST, OR SHORTNESS OF BREATH, Disp: 18 each, Rfl: 1   ascorbic acid (VITAMIN C) 1000 MG tablet, Take by mouth., Disp: , Rfl:    aspirin-acetaminophen-caffeine (EXCEDRIN MIGRAINE) 250-250-65 MG tablet, Take by mouth., Disp: , Rfl:    azelastine (ASTELIN) 0.1 % nasal spray, Place into both nostrils 2 (two) times daily. Use in each nostril as directed, Disp: , Rfl:    b complex vitamins capsule, Take 1 capsule by mouth daily., Disp: , Rfl:    budesonide-formoterol (SYMBICORT) 80-4.5 MCG/ACT inhaler, Inhale 2 puffs into the lungs in the morning and at bedtime. with spacer and rinse mouth afterwards., Disp: 1 each, Rfl: 5   calcium citrate-vitamin D (CITRACAL+D) 315-200 MG-UNIT tablet, Take by mouth., Disp: , Rfl:    Cholecalciferol (VITAMIN D3) 125 MCG (5000 UT) CAPS, Take 5,000 Units by mouth daily., Disp: , Rfl:    cycloSPORINE (RESTASIS) 0.05 % ophthalmic emulsion, , Disp: , Rfl:    Dermatological Products, Misc. (SUVICORT) EMUL, , Disp: , Rfl:    DHEA 25 MG CAPS, Take by mouth., Disp: , Rfl:    fluticasone (FLONASE) 50 MCG/ACT nasal spray, Place 2 sprays into both nostrils daily., Disp: 16 g, Rfl: 6   guaiFENesin (MUCINEX) 600 MG 12 hr tablet, Take 1 tablet (600 mg total) by mouth 2 (two) times daily for 10 days., Disp: 20 tablet, Rfl: 0   hyoscyamine (LEVSIN SL) 0.125 MG SL tablet, Place under the tongue every 4 (four) hours as needed., Disp: , Rfl:    ipratropium (ATROVENT) 0.06 % nasal spray, Place 1-2 sprays into both nostrils 3 (  three) times daily as needed (drainage)., Disp: 15 mL, Rfl: 5   lidocaine (XYLOCAINE) 2 % jelly, APPLY TOPICALLY TO AFFECTED AREA EVERY DAY AS NEEDED (use sparingly), Disp: 85 g, Rfl: 0   Lidocaine-Collagen-Aloe Vera (REGENECARE) 2 % GEL, Apply topically daily., Disp: 85 g, Rfl: 2   magnesium oxide (MAG-OX) 400 MG tablet, Take by mouth., Disp: , Rfl:    melatonin 5 MG TABS, Take 5 mg by mouth., Disp: , Rfl:    mirtazapine (REMERON) 7.5 MG  tablet, Take 1 tablet (7.5 mg total) by mouth at bedtime., Disp: 90 tablet, Rfl: 1   mometasone (NASONEX) 50 MCG/ACT nasal spray, Place 1 spray into the nose 2 (two) times daily as needed (nasal congestion)., Disp: 1 each, Rfl: 5   Polyethylene Glycol 3350 (MIRALAX PO), Take by mouth as needed., Disp: , Rfl:    Probiotic Product (PROBIOTIC PO), Take by mouth daily., Disp: , Rfl:    Simethicone (GAS-X PO), Take by mouth., Disp: , Rfl:    tizanidine (ZANAFLEX) 2 MG capsule, Take 1-2 capsules (2-4 mg total) by mouth 3 (three) times daily as needed for muscle spasms., Disp: 60 capsule, Rfl: 2   traMADol (ULTRAM) 50 MG tablet, Take 1 tablet (50 mg total) by mouth every 12 (twelve) hours as needed., Disp: 30 tablet, Rfl: 1   UNABLE TO FIND, Take 1 capsule by mouth daily. Dv3- vitamin D 3 plus immune support, Disp: , Rfl:   Current Facility-Administered Medications:    zoledronic acid (RECLAST) injection 5 mg, 5 mg, Intravenous, Once, Gottschalk, Ashly M, DO  Allergies  Allergen Reactions   Cefuroxime Axetil Other (See Comments)    Extreme gas   Gabapentin Other (See Comments)    Constipation Constipation   Montelukast Other (See Comments)    Numbness and tingling in hand. Numbness and tingling in hand. Numbness and tingling in hand. NUMBNESS OF HANDS AND FEET. Numbness in hands and feet NUMBNESS OF HANDS AND FEET.   Pregabalin Other (See Comments)    Drowsiness, dry mouth Drowsiness, dry mouth Makes her tired and thirsty   Augmentin [Amoxicillin-Pot Clavulanate] Diarrhea   Avelox [Moxifloxacin Hcl In Nacl] Other (See Comments)    Tremors    Biaxin [Clarithromycin]     Unsure of reaction   Cedax [Ceftibuten] Other (See Comments)    tremors   Ciprofloxacin Other (See Comments) and Nausea And Vomiting    Blurred vision Blurred vision Pt can't remember reaction   Clindamycin/Lincomycin     tachycardia   Levofloxacin Other (See Comments)    Feels dehydrated   Lincomycin Other (See  Comments)    tachycardia   Montelukast Sodium Other (See Comments)    Numbness and tingling in hand. Numbness and tingling in hand.   Sulfonamide Derivatives Other (See Comments)    Gi upset   Sulfa Antibiotics Nausea Only    Other reaction(s): Other (See Comments) Gi upset  Other reaction(s): Other (See Comments) Gi upset Other reaction(s): Unknown    Objective:   BP 103/66   Pulse 90   Temp 98.2 F (36.8 C) (Temporal)   Ht '5\' 7"'$  (1.702 m)   Wt 115 lb 12.8 oz (52.5 kg)   SpO2 99%   BMI 18.14 kg/m    Physical Exam Vitals reviewed.  Constitutional:      General: She is not in acute distress.    Appearance: Normal appearance. She is not ill-appearing, toxic-appearing or diaphoretic.  HENT:     Head: Normocephalic and atraumatic.  Right Ear: Tympanic membrane, ear canal and external ear normal. There is no impacted cerumen.     Left Ear: Tympanic membrane, ear canal and external ear normal. There is no impacted cerumen.     Nose: Congestion and rhinorrhea present. Rhinorrhea is clear.     Mouth/Throat:     Mouth: Mucous membranes are moist.     Pharynx: Oropharynx is clear. Posterior oropharyngeal erythema present. No oropharyngeal exudate.  Eyes:     General: No scleral icterus.       Right eye: No discharge.        Left eye: No discharge.     Conjunctiva/sclera: Conjunctivae normal.  Cardiovascular:     Rate and Rhythm: Normal rate and regular rhythm.     Heart sounds: Normal heart sounds. No murmur heard.    No friction rub. No gallop.  Pulmonary:     Effort: Pulmonary effort is normal. No respiratory distress.     Breath sounds: Normal breath sounds. No stridor. No wheezing, rhonchi or rales.  Musculoskeletal:        General: Normal range of motion.     Cervical back: Normal range of motion.  Lymphadenopathy:     Cervical: No cervical adenopathy.  Skin:    General: Skin is warm and dry.     Capillary Refill: Capillary refill takes less than 2 seconds.   Neurological:     General: No focal deficit present.     Mental Status: She is alert and oriented to person, place, and time. Mental status is at baseline.  Psychiatric:        Mood and Affect: Mood normal.        Behavior: Behavior normal.        Thought Content: Thought content normal.        Judgment: Judgment normal.

## 2021-10-04 NOTE — Telephone Encounter (Signed)
Wendy/ Jamie typically arranged for this for our patients.  Not sure who is covering.

## 2021-10-05 ENCOUNTER — Encounter: Payer: Self-pay | Admitting: Physical Therapy

## 2021-10-05 ENCOUNTER — Ambulatory Visit: Payer: Medicare HMO | Attending: Family Medicine | Admitting: Physical Therapy

## 2021-10-05 DIAGNOSIS — R293 Abnormal posture: Secondary | ICD-10-CM | POA: Diagnosis not present

## 2021-10-05 DIAGNOSIS — M542 Cervicalgia: Secondary | ICD-10-CM | POA: Diagnosis not present

## 2021-10-05 NOTE — Therapy (Addendum)
OUTPATIENT PHYSICAL THERAPY TREATMENT NOTE   Patient Name: Kristy Garza MRN: [id number] DOB:[birth year], 71 y.o., female Today's Date: 10/05/2021   REFERRING PROVIDER: Ronnie Doss DO   PT End of Session - 10/05/21 1333     Visit Number 5    Number of Visits 12    Date for PT Re-Evaluation 11/24/21    Authorization Type FOTO AT LEAST EVERY 5TH VISIT.  PROGRESS NOTE AT 10TH VISIT.  KX MODIFIER AFTER 15 VISITS.    PT Start Time 1255    PT Stop Time 1344    PT Time Calculation (min) 49 min    Activity Tolerance Patient tolerated treatment well    Behavior During Therapy WFL for tasks assessed/performed             Past Medical History:  Diagnosis Date   Abdominal pain 01/16/2013   Acid reflux    Allergy    Arthritis    ARTHRITIS IN NECK BY DR. Alroy Dust [facility]   Asthma    Interstitial cystitis    Osteoporosis    PONV (postoperative nausea and vomiting)    Tinnitus    Trigeminal neuralgia    Atypical trigeminal neuralgia   Past Surgical History:  Procedure Laterality Date   ABDOMINAL HYSTERECTOMY  2000   CYSTOSCOPY WITH HYDRODISTENSION AND BIOPSY N/A 06/11/2012   Procedure: CYSTOSCOPY/BIOPSY/HYDRODISTENSION with Instillation of Pyridium and Marcaine and Kenalog;  Surgeon: Ailene Rud, MD;  Location: [facility];  Service: Urology;  Laterality: N/A;  1 hour requested for this case  BLADDER BIOPSY   ETHMOIDECTOMY  2012   SEPTOPLASTY  1980's   vocal cord surgery   1990's   polyp removal   Patient Active Problem List   Diagnosis Date Noted   Recurrent infections 03/15/2021   Moderate persistent asthma without complication 40/98/1191   Raynaud phenomenon 06/16/2020   Tear film insufficiency 06/16/2020   Unspecified behavioral syndromes associated with physiological disturbances and physical factors 06/16/2020   Inflammatory arthritis 06/16/2020   Iron deficiency anemia, unspecified 04/17/2020   Age-related osteoporosis without current  pathological fracture 01/04/2019   History of adenomatous polyp of colon 07/02/2018   Malnutrition of moderate degree ([place]) 05/23/2018   Adult BMI <19 kg/sq m 04/13/2018   Incontinence of feces 04/12/2018   Anxiety state 01/19/2016   Adjustment disorder with anxious mood 01/19/2016   HLD (hyperlipidemia) 01/07/2016   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   Chronic constipation 06/22/2015   Chronic bronchitis ([place]) 05/07/2015   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  THERAPY DIAG:  Cervicalgia  Abnormal posture  Rationale for Evaluation and Treatment Rehabilitation  PERTINENT HISTORY: OP.  PRECAUTIONS: OP  SUBJECTIVE: Pain at a 3 and no headache.  PAIN:  Are you having pain?  3/10.  Neck.  Ache.     TODAY'S TREATMENT:   Trigger Point Dry-Needling  Treatment instructions: Expect mild to moderate muscle soreness.  S/S of pneumothorax if dry needled over a lung field, and to seek immediate medical attention should they occur. Patient verbalized understanding of these instructions and education.  Patient Consent Given: Yes Education handout provided: Yes Muscles treated: Bilateral upper cervical paraspinals. Treatment response/outcome: Twitch.   STW/M x 23 minutes to patient's bilateral upper cervical/suboccipital and UT's to reduce tone f/b IFC at 80-150 Hz on 40% scan x  12 minutes.        PT Long Term Goals - 10/05/21 1332       PT LONG TERM GOAL #1   Title be independent in advanced HEP    Time 6    Period Weeks    Status New      PT LONG TERM GOAL #2   Title Increase active cervical rotation to 75 degrees+ so patient can turn head more easily while driving.    Time 6    Period Weeks    Status New      PT LONG TERM GOAL #3   Title Eliminate headaches.    Time 6    Period Weeks    Status New      PT LONG TERM GOAL #4   Title Perform ADL's with neck pain not > 3/10.    Time 6    Period Weeks    Status New              Plan - 10/05/21 1339     Clinical Impression Statement Great response to treatments thus far with a reduction in pain and headaches.   Personal Factors and Comorbidities Comorbidity 1;Other    Comorbidities H/o TMJ pain, OP.    Examination-Activity Limitations Other;Hygiene/Grooming    Examination-Participation Restrictions Other    Stability/Clinical Decision Making Stable/Uncomplicated    Rehab Potential Excellent    PT Frequency 2x / week    PT Duration 6 weeks    PT Treatment/Interventions ADLs/Self Care Home Management;Cryotherapy;Electrical Stimulation;Ultrasound;Moist Heat;Therapeutic activities;Therapeutic exercise;Manual techniques;Patient/family education;Passive range of motion;Dry needling    PT Next Visit Plan Combo e'stim/US; STW/M including suboccipital release, postural exercises.    Consulted and Agree with Plan of Care Patient               Drexel Ivey, [place], PT 10/05/2021, 1:45 PM    PHYSICAL THERAPY DISCHARGE SUMMARY  Visits from Start of Care: 5.  Current functional level related to goals / functional outcomes: See above.   Remaining deficits: See below.   Education / Equipment: HEP.   Patient agrees to discharge. Patient goals were not met. Patient is being discharged due to not returning since the last visit.    Mali Sharay Bellissimo MPT

## 2021-10-11 ENCOUNTER — Encounter: Payer: Medicare HMO | Admitting: Physical Therapy

## 2021-10-12 ENCOUNTER — Other Ambulatory Visit: Payer: Self-pay

## 2021-10-12 ENCOUNTER — Encounter: Payer: Self-pay | Admitting: Family Medicine

## 2021-10-12 ENCOUNTER — Other Ambulatory Visit: Payer: Self-pay | Admitting: *Deleted

## 2021-10-12 ENCOUNTER — Telehealth: Payer: Self-pay | Admitting: Pharmacy Technician

## 2021-10-12 DIAGNOSIS — M810 Age-related osteoporosis without current pathological fracture: Secondary | ICD-10-CM | POA: Insufficient documentation

## 2021-10-12 NOTE — Telephone Encounter (Signed)
I called [facility] Their [facility] area does this, but only for [facility] physician orders.   We will have to stick with a [facility] facility.  I called pt and discussed this - she is okay with going to the [facility] at [facility]   Patient referrals may be made by a provider only. For more information, call [phone].  Spoke with Levan Hurst when I called - they just need this order and then they will check insurance, call the pt and schedule her.   I will attempt to pend order in an orders only encounter for you

## 2021-10-12 NOTE — Telephone Encounter (Signed)
There is no longer a paper trial for this- all in epic.  I attempted to put this order in for you, but did not have the access needed.   I spoke with the manager at the infusion center and he placed this order for you. You do not have to do anything from this point forward.   They will call pt and set it up.   (Side note, they are going to open AP back up for infusions in the coming months, working out the logistics now)   (He is also putting in a ticket for [place] and I to be granted access for this)

## 2021-10-12 NOTE — Telephone Encounter (Signed)
Kristy Garza always gave me an order to fax for this.  They will need to tell me exactly the order number in order for me to place. Not sure what they are wanting as I've only ever filled out a paper form.

## 2021-10-12 NOTE — Telephone Encounter (Signed)
Thank you so very much for coordinating this patient's care!

## 2021-10-12 NOTE — Telephone Encounter (Signed)
Dr. [name], Juluis Rainier note:  Auth Submission: no auth needed Payer: [employer] medicare Medication & CPT/J Code(s) submitted: Reclast (Zolendronic acid) S0630 Route of submission (phone, fax, portal): phone Auth type: Buy/Bill Units/visits requested: x1 dose Reference number:  Approval from: 10/12/21 to 03/27/22   Patient will be scheduled as soon as possible.  Maudie Mercury

## 2021-10-14 ENCOUNTER — Ambulatory Visit (INDEPENDENT_AMBULATORY_CARE_PROVIDER_SITE_OTHER): Payer: Medicare HMO | Admitting: Family Medicine

## 2021-10-14 ENCOUNTER — Encounter: Payer: Self-pay | Admitting: Family Medicine

## 2021-10-14 ENCOUNTER — Ambulatory Visit: Payer: Medicare HMO | Admitting: Family

## 2021-10-14 VITALS — BP 121/79 | HR 67 | Temp 98.7°F | Ht 67.0 in | Wt 112.0 lb

## 2021-10-14 DIAGNOSIS — M5137 Other intervertebral disc degeneration, lumbosacral region: Secondary | ICD-10-CM | POA: Diagnosis not present

## 2021-10-14 DIAGNOSIS — M9903 Segmental and somatic dysfunction of lumbar region: Secondary | ICD-10-CM | POA: Diagnosis not present

## 2021-10-14 DIAGNOSIS — J301 Allergic rhinitis due to pollen: Secondary | ICD-10-CM

## 2021-10-14 DIAGNOSIS — M9902 Segmental and somatic dysfunction of thoracic region: Secondary | ICD-10-CM | POA: Diagnosis not present

## 2021-10-14 DIAGNOSIS — M9901 Segmental and somatic dysfunction of cervical region: Secondary | ICD-10-CM | POA: Diagnosis not present

## 2021-10-14 NOTE — Progress Notes (Signed)
Subjective:  Patient ID: Kristy Garza, female    DOB: 13-Aug-1950, 71 y.o.   MRN: [id number]  Patient Care Team: [name], DO as PCP - General (Family Medicine) Deneise Lever, MD as Consulting Physician (Pulmonary Disease) Minus Breeding, MD as Consulting Physician (Cardiology) Shea Evans [name], LCSW as Social Worker (Licensed Clinical Social Worker) Ilean China, RN as Case Scientist, physiological Complaint:  cough, congestion, phlegm, drainage   HPI: Kristy Garza is a 71 y.o. female presenting on 10/14/2021 for cough, congestion, phlegm, drainage   URI  This is a recurrent problem. The current episode started 1 to 4 weeks ago. The problem has been waxing and waning. There has been no fever. Associated symptoms include congestion, coughing, ear pain, a plugged ear sensation and rhinorrhea. Pertinent negatives include no abdominal pain, chest pain, diarrhea, dysuria, headaches, joint pain, joint swelling, nausea, neck pain, rash, sinus pain, sneezing, sore throat, swollen glands, vomiting or wheezing. She has tried decongestant for the symptoms. The treatment provided no relief.    Relevant past medical, surgical, family, and social history reviewed and updated as indicated.  Allergies and medications reviewed and updated. Data reviewed: Chart in Epic.   Past Medical History:  Diagnosis Date   Abdominal pain 01/16/2013   Acid reflux    Allergy    Arthritis    ARTHRITIS IN NECK BY DR. Alroy Dust [facility]   Asthma    Interstitial cystitis    Osteoporosis    PONV (postoperative nausea and vomiting)    Tinnitus    Trigeminal neuralgia    Atypical trigeminal neuralgia    Past Surgical History:  Procedure Laterality Date   ABDOMINAL HYSTERECTOMY  2000   CYSTOSCOPY WITH HYDRODISTENSION AND BIOPSY N/A 06/11/2012   Procedure: CYSTOSCOPY/BIOPSY/HYDRODISTENSION with Instillation of Pyridium and Marcaine and Kenalog;  Surgeon: Ailene Rud, MD;  Location:  [facility];  Service: Urology;  Laterality: N/A;  1 hour requested for this case  BLADDER BIOPSY   ETHMOIDECTOMY  2012   SEPTOPLASTY  1980's   vocal cord surgery   1990's   polyp removal    Social History   Socioeconomic History   Marital status: Married    Spouse name: [name]   Number of children: 3   Years of education: 12   Highest education level: Some college, no degree  Occupational History   Occupation: CNA    Comment: Retired  Tobacco Use   Smoking status: Never   Smokeless tobacco: Never  Vaping Use   Vaping Use: Never used  Substance and Sexual Activity   Alcohol use: No    Alcohol/week: 0.0 standard drinks of alcohol    Comment: 01-19-2016 per pt no   Drug use: No    Comment: 01-19-2016 per pt no    Sexual activity: Not Currently    Birth control/protection: Surgical  Other Topics Concern   Not on file  Social History Narrative   Lives with husband.    Social Determinants of Health   Financial Resource Strain: Low Risk  (09/13/2019)   Overall Financial Resource Strain (CARDIA)    Difficulty of Paying Living Expenses: Not hard at all  Food Insecurity: No Food Insecurity (09/13/2019)   Hunger Vital Sign    Worried About Running Out of Food in the Last Year: Never true    Ran Out of Food in the Last Year: Never true  Transportation Needs: Unmet Transportation Needs (03/08/2021)   PRAPARE -  Hydrologist (Medical): No    Lack of Transportation (Non-Medical): Yes  Physical Activity: Inactive (05/03/2021)   Exercise Vital Sign    Days of Exercise per Week: 0 days    Minutes of Exercise per Session: 0 min  Stress: Stress Concern Present (05/03/2021)   [place]    Feeling of Stress : Rather much  Social Connections: Socially Integrated (09/13/2019)   Social Connection and Isolation Panel [NHANES]    Frequency of Communication with Friends and Family:  More than three times a week    Frequency of Social Gatherings with Friends and Family: More than three times a week    Attends Religious Services: More than 4 times per year    Active Member of [employer] or Organizations: Yes    Attends Music therapist: More than 4 times per year    Marital Status: Married  Human resources officer Violence: Not At Risk (09/13/2019)   Humiliation, Afraid, Rape, and Kick questionnaire    Fear of Current or Ex-Partner: No    Emotionally Abused: No    Physically Abused: No    Sexually Abused: No    Outpatient Encounter Medications as of 10/14/2021  Medication Sig   acetaminophen (TYLENOL) 500 MG tablet Take by mouth.   albuterol (PROVENTIL) (2.5 MG/3ML) 0.083% nebulizer solution Take 3 mLs (2.5 mg total) by nebulization every 4 (four) hours as needed for wheezing or shortness of breath (coughing fits).   albuterol (VENTOLIN HFA) 108 (90 Base) MCG/ACT inhaler USE 2 PUFFS EVERY 4 HOURS AS NEEDED FOR COUGH, WHEEZE, TIGHTNESS IN CHEST, OR SHORTNESS OF BREATH   ascorbic acid (VITAMIN C) 1000 MG tablet Take by mouth.   aspirin-acetaminophen-caffeine (EXCEDRIN MIGRAINE) 250-250-65 MG tablet Take by mouth.   azelastine (ASTELIN) 0.1 % nasal spray Place into both nostrils 2 (two) times daily. Use in each nostril as directed   b complex vitamins capsule Take 1 capsule by mouth daily.   budesonide-formoterol (SYMBICORT) 80-4.5 MCG/ACT inhaler Inhale 2 puffs into the lungs in the morning and at bedtime. with spacer and rinse mouth afterwards.   calcium citrate-vitamin D (CITRACAL+D) 315-200 MG-UNIT tablet Take by mouth.   cetirizine (ZYRTEC) 10 MG tablet Take 1 tablet (10 mg total) by mouth daily.   Cholecalciferol (VITAMIN D3) 125 MCG (5000 UT) CAPS Take 5,000 Units by mouth daily.   cycloSPORINE (RESTASIS) 0.05 % ophthalmic emulsion    Dermatological Products, Misc. (SUVICORT) EMUL    DHEA 25 MG CAPS Take by mouth.   fluticasone (FLONASE) 50 MCG/ACT nasal spray  Place 2 sprays into both nostrils daily.   hyoscyamine (LEVSIN SL) 0.125 MG SL tablet Place under the tongue every 4 (four) hours as needed.   ipratropium (ATROVENT) 0.06 % nasal spray Place 1-2 sprays into both nostrils 3 (three) times daily as needed (drainage).   lidocaine (XYLOCAINE) 2 % jelly APPLY TOPICALLY TO AFFECTED AREA EVERY DAY AS NEEDED (use sparingly)   Lidocaine-Collagen-Aloe Vera (REGENECARE) 2 % GEL Apply topically daily.   magnesium oxide (MAG-OX) 400 MG tablet Take by mouth.   melatonin 5 MG TABS Take 5 mg by mouth.   mirtazapine (REMERON) 7.5 MG tablet Take 1 tablet (7.5 mg total) by mouth at bedtime.   mometasone (NASONEX) 50 MCG/ACT nasal spray Place 1 spray into the nose 2 (two) times daily as needed (nasal congestion).   Polyethylene Glycol 3350 (MIRALAX PO) Take by mouth as needed.   Probiotic  Product (PROBIOTIC PO) Take by mouth daily.   Simethicone (GAS-X PO) Take by mouth.   tizanidine (ZANAFLEX) 2 MG capsule Take 1-2 capsules (2-4 mg total) by mouth 3 (three) times daily as needed for muscle spasms.   traMADol (ULTRAM) 50 MG tablet Take 1 tablet (50 mg total) by mouth every 12 (twelve) hours as needed.   UNABLE TO FIND Take 1 capsule by mouth daily. Dv3- vitamin D 3 plus immune support   Facility-Administered Encounter Medications as of 10/14/2021  Medication   zoledronic acid (RECLAST) injection 5 mg    Allergies  Allergen Reactions   Cefuroxime Axetil Other (See Comments)    Extreme gas   Gabapentin Other (See Comments)    Constipation Constipation   Montelukast Other (See Comments)    Numbness and tingling in hand. Numbness and tingling in hand. Numbness and tingling in hand. NUMBNESS OF HANDS AND FEET. Numbness in hands and feet NUMBNESS OF HANDS AND FEET.   Pregabalin Other (See Comments)    Drowsiness, dry mouth Drowsiness, dry mouth Makes her tired and thirsty   Augmentin [Amoxicillin-Pot Clavulanate] Diarrhea   Avelox [Moxifloxacin Hcl In  Nacl] Other (See Comments)    Tremors    Biaxin [Clarithromycin]     Unsure of reaction   Cedax [Ceftibuten] Other (See Comments)    tremors   Ciprofloxacin Other (See Comments) and Nausea And Vomiting    Blurred vision Blurred vision Pt can't remember reaction   Clindamycin/Lincomycin     tachycardia   Levofloxacin Other (See Comments)    Feels dehydrated   Lincomycin Other (See Comments)    tachycardia   Montelukast Sodium Other (See Comments)    Numbness and tingling in hand. Numbness and tingling in hand.   Sulfonamide Derivatives Other (See Comments)    Gi upset   Sulfa Antibiotics Nausea Only    Other reaction(s): Other (See Comments) Gi upset  Other reaction(s): Other (See Comments) Gi upset Other reaction(s): Unknown    Review of Systems  Constitutional:  Positive for activity change. Negative for appetite change, chills, diaphoresis, fatigue, fever and unexpected weight change.  HENT:  Positive for congestion, ear pain, postnasal drip and rhinorrhea. Negative for dental problem, drooling, ear discharge, facial swelling, hearing loss, mouth sores, nosebleeds, sinus pressure, sinus pain, sneezing, sore throat, tinnitus, trouble swallowing and voice change.   Eyes: Negative.  Negative for photophobia and visual disturbance.  Respiratory:  Positive for cough. Negative for chest tightness, shortness of breath and wheezing.   Cardiovascular:  Negative for chest pain, palpitations and leg swelling.  Gastrointestinal:  Negative for abdominal pain, blood in stool, constipation, diarrhea, nausea and vomiting.  Endocrine: Negative.   Genitourinary:  Negative for decreased urine volume, difficulty urinating, dysuria, frequency and urgency.  Musculoskeletal:  Negative for arthralgias, joint pain, myalgias and neck pain.  Skin: Negative.  Negative for rash.  Allergic/Immunologic: Negative.   Neurological:  Negative for dizziness, tremors, seizures, syncope, facial asymmetry,  speech difficulty, weakness, light-headedness, numbness and headaches.  Hematological: Negative.   Psychiatric/Behavioral:  Negative for confusion, hallucinations, sleep disturbance and suicidal ideas.   All other systems reviewed and are negative.       Objective:  BP 121/79   Pulse 67   Temp 98.7 F (37.1 C)   Ht '5\' 7"'$  (1.702 m)   Wt 112 lb (50.8 kg)   SpO2 97%   BMI 17.54 kg/m    Wt Readings from Last 3 Encounters:  10/14/21 112 lb (50.8 kg)  10/01/21 115 lb 12.8 oz (52.5 kg)  09/21/21 113 lb (51.3 kg)    Physical Exam Vitals and nursing note reviewed.  Constitutional:      General: She is not in acute distress.    Appearance: Normal appearance. She is not ill-appearing, toxic-appearing or diaphoretic.  HENT:     Head: Normocephalic and atraumatic.     Right Ear: Tympanic membrane, ear canal and external ear normal. There is no impacted cerumen.     Left Ear: Tympanic membrane, ear canal and external ear normal. There is no impacted cerumen.     Nose: Congestion present. No rhinorrhea.     Right Turbinates: Not enlarged, swollen or pale.     Left Turbinates: Not enlarged, swollen or pale.     Right Sinus: No maxillary sinus tenderness or frontal sinus tenderness.     Left Sinus: No maxillary sinus tenderness or frontal sinus tenderness.     Mouth/Throat:     Mouth: Mucous membranes are moist.     Pharynx: Oropharynx is clear. No oropharyngeal exudate or posterior oropharyngeal erythema.  Eyes:     Conjunctiva/sclera: Conjunctivae normal.     Pupils: Pupils are equal, round, and reactive to light.  Cardiovascular:     Rate and Rhythm: Normal rate and regular rhythm.     Heart sounds: Normal heart sounds.  Pulmonary:     Effort: Pulmonary effort is normal.     Breath sounds: Normal breath sounds. No wheezing or rhonchi.  Musculoskeletal:     Cervical back: Normal range of motion and neck supple.  Lymphadenopathy:     Cervical: No cervical adenopathy.  Skin:     General: Skin is warm and dry.     Capillary Refill: Capillary refill takes less than 2 seconds.  Neurological:     General: No focal deficit present.     Mental Status: She is alert and oriented to person, place, and time.  Psychiatric:        Mood and Affect: Mood normal.        Behavior: Behavior normal.        Thought Content: Thought content normal.        Judgment: Judgment normal.     Results for orders placed or performed in visit on 09/02/21  Urinalysis, Routine w reflex microscopic  Result Value Ref Range   Specific Gravity, UA <1.005 (L) 1.005 - 1.030   pH, UA 6.0 5.0 - 7.5   Color, UA Yellow Yellow   Appearance Ur Clear Clear   Leukocytes,UA Negative Negative   Protein,UA Negative Negative/Trace   Glucose, UA Negative Negative   Ketones, UA Negative Negative   RBC, UA Negative Negative   Bilirubin, UA Negative Negative   Urobilinogen, Ur 0.2 0.2 - 1.0 mg/dL   Nitrite, UA Negative Negative   *Note: Due to a large number of results and/or encounters for the requested time period, some results have not been displayed. A complete set of results can be found in Results Review.       Pertinent labs & imaging results that were available during my care of the patient were reviewed by me and considered in my medical decision making.  Assessment & Plan:  Kristy Garza was seen today for cough, congestion, phlegm, drainage.  Diagnoses and all orders for this visit:  Seasonal allergic rhinitis due to pollen Has not switched antihistamine as advised, pt aware to switch. No indications of bacterial infection. Symptomatic management discussed in detail. Follow up with ENT  on Monday as scheduled.     Continue all other maintenance medications.  Follow up plan: Return if symptoms worsen or fail to improve.   Continue healthy lifestyle choices, including diet (rich in fruits, vegetables, and lean proteins, and low in salt and simple carbohydrates) and exercise (at least 30  minutes of moderate physical activity daily).  Educational handout given for allergic rhinitis  The above assessment and management plan was discussed with the patient. The patient verbalized understanding of and has agreed to the management plan. Patient is aware to call the clinic if they develop any new symptoms or if symptoms persist or worsen. Patient is aware when to return to the clinic for a follow-up visit. Patient educated on when it is appropriate to go to the emergency department.   Monia Pouch, FNP-C [facility] [phone]

## 2021-10-16 ENCOUNTER — Other Ambulatory Visit: Payer: Self-pay | Admitting: Family Medicine

## 2021-10-16 DIAGNOSIS — K219 Gastro-esophageal reflux disease without esophagitis: Secondary | ICD-10-CM

## 2021-10-18 DIAGNOSIS — R058 Other specified cough: Secondary | ICD-10-CM | POA: Diagnosis not present

## 2021-10-18 DIAGNOSIS — R0989 Other specified symptoms and signs involving the circulatory and respiratory systems: Secondary | ICD-10-CM | POA: Diagnosis not present

## 2021-10-18 DIAGNOSIS — Q310 Web of larynx: Secondary | ICD-10-CM | POA: Diagnosis not present

## 2021-10-18 DIAGNOSIS — K219 Gastro-esophageal reflux disease without esophagitis: Secondary | ICD-10-CM | POA: Diagnosis not present

## 2021-10-20 ENCOUNTER — Other Ambulatory Visit: Payer: Medicare HMO

## 2021-10-20 DIAGNOSIS — R7989 Other specified abnormal findings of blood chemistry: Secondary | ICD-10-CM | POA: Diagnosis not present

## 2021-10-21 ENCOUNTER — Ambulatory Visit: Payer: Self-pay | Admitting: *Deleted

## 2021-10-21 DIAGNOSIS — M810 Age-related osteoporosis without current pathological fracture: Secondary | ICD-10-CM

## 2021-10-21 LAB — T3, FREE: T3, Free: 2.2 pg/mL (ref 2.0–4.4)

## 2021-10-21 LAB — T4, FREE: Free T4: 1.42 ng/dL (ref 0.82–1.77)

## 2021-10-21 LAB — TSH: TSH: 0.335 u[IU]/mL — ABNORMAL LOW (ref 0.450–4.500)

## 2021-10-21 NOTE — Patient Instructions (Signed)
Lorece S Lesperance  I have previously worked with you through the Chronic Care Management Program at [facility]. Due to program changes I am removing myself from your care team because you've either met our goals, your conditions are stable and no longer require care management, or we haven't engaged within the past 6 months. If you are currently active with another CCM Team Member, you will remain active with them unless they reach out to you with additional information. If you feel that you need RN Care Management services in the future,  please talk with your primary care provider and request a new referral for Care Management or Care Coordination. This does not affect your status as a patient at [facility].   Thank you for allowing me to participate in your your healthcare journey.   , BSN, RN-BC Embedded Chronic Care Manager [facility] / [facility] Care Management Direct Dial: [phone]  

## 2021-10-21 NOTE — Chronic Care Management (AMB) (Signed)
  Chronic Care Management   Note  10/21/2021 Name: Kristy Garza MRN: [id number] DOB: June 27, 1950   Patient has either met RN Care Management goals, is stable from [facility] Management perspective, or has not recently engaged with the RN Care Manager. I am removing RN Care Manager from Care Team and closing [facility]. If patient is currently engaged with another CCM team member I will forward this encounter to inform them of my case closure. Their PCP can place a new referral if the patient needs Care Management or Care Coordination services in the future.  Chong Sicilian, BSN, RN-BC Embedded Chronic Care Manager [facility] / [facility] Management Direct Dial: [phone]

## 2021-10-22 ENCOUNTER — Encounter: Payer: Self-pay | Admitting: Family Medicine

## 2021-10-22 ENCOUNTER — Ambulatory Visit (INDEPENDENT_AMBULATORY_CARE_PROVIDER_SITE_OTHER): Payer: Medicare HMO | Admitting: Family Medicine

## 2021-10-22 VITALS — BP 108/70 | HR 86 | Temp 98.1°F | Ht 67.0 in | Wt 112.8 lb

## 2021-10-22 DIAGNOSIS — F418 Other specified anxiety disorders: Secondary | ICD-10-CM | POA: Diagnosis not present

## 2021-10-22 DIAGNOSIS — G479 Sleep disorder, unspecified: Secondary | ICD-10-CM | POA: Diagnosis not present

## 2021-10-22 DIAGNOSIS — R4589 Other symptoms and signs involving emotional state: Secondary | ICD-10-CM | POA: Diagnosis not present

## 2021-10-22 DIAGNOSIS — R69 Illness, unspecified: Secondary | ICD-10-CM | POA: Diagnosis not present

## 2021-10-22 MED ORDER — LORAZEPAM 0.5 MG PO TABS: 0.5000 mg | ORAL_TABLET | Freq: Two times a day (BID) | ORAL | 2 refills | Status: DC | PRN

## 2021-10-22 NOTE — Patient Instructions (Signed)
Dr Nehemiah Settle (sometime in August/ Sept)- psychiatrist Maye Hides (therapist)  Both in [place]. You should receive a call re: referrals/ appts

## 2021-10-22 NOTE — Progress Notes (Signed)
Subjective: EU:MPNTI/ mood PCP: [name], DO RWE:RXVQMG Kristy Garza is a 71 y.o. female presenting to clinic today for:  1. Depression/ anxiety/ sleep difficulty Mirtazapine 7.'5mg'$  added last visit.  She notes that she had this stopped shortly after starting because she felt too sedated the following day off the medication.  She has tried and failed now mirtazapine, amitriptyline, Cymbalta, Lexapro.  She uses Ativan sparingly but this has been the only thing that she is actually been able to tolerate so far.  She reports feelings of guilt, surrounding the decline of her mother'Kristy health and feeling like she was not present enough during her final years.  She feels guilty with regards to the level of care that her sister provided to the mother.  She is not under the care of a counselor nor psychiatrist but she is very interested in seeing one because she "does not want to have a nervous breakdown like her parents did".  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   ROS: Per HPI  Allergies  Allergen Reactions   Cefuroxime Axetil Other (See Comments)    Extreme gas   Gabapentin Other (See Comments)    Constipation Constipation   Montelukast Other (See Comments)    Numbness and tingling in hand. Numbness and tingling in hand. Numbness and tingling in hand. NUMBNESS OF HANDS AND FEET. Numbness in hands and feet NUMBNESS OF HANDS AND FEET.   Pregabalin Other (See Comments)    Drowsiness, dry mouth Drowsiness, dry mouth Makes her tired and thirsty   Augmentin [Amoxicillin-Pot Clavulanate] Diarrhea   Avelox [Moxifloxacin Hcl In Nacl] Other (See Comments)    Tremors    Biaxin [Clarithromycin]     Unsure of reaction   Cedax [Ceftibuten] Other (See Comments)    tremors   Ciprofloxacin Other (See Comments) and Nausea And Vomiting    Blurred  vision Blurred vision Pt can't remember reaction   Clindamycin/Lincomycin     tachycardia   Levofloxacin Other (See Comments)    Feels dehydrated   Lincomycin Other (See Comments)    tachycardia   Montelukast Sodium Other (See Comments)    Numbness and tingling in hand. Numbness and tingling in hand.   Sulfonamide Derivatives Other (See Comments)    Gi upset   Sulfa Antibiotics Nausea Only    Other reaction(Kristy): Other (See Comments) Gi upset  Other reaction(Kristy): Other (See Comments) Gi upset Other reaction(Kristy): Unknown   Past Medical History:  Diagnosis Date   Abdominal pain 01/16/2013   Acid reflux    Allergy    Arthritis    ARTHRITIS IN NECK BY DR. Alroy Dust [facility]   Asthma    Interstitial cystitis    Osteoporosis    PONV (postoperative nausea and vomiting)    Tinnitus    Trigeminal neuralgia    Atypical trigeminal neuralgia    Current Outpatient Medications:    acetaminophen (TYLENOL) 500 MG tablet, Take by mouth., Disp: , Rfl:    albuterol (PROVENTIL) (2.5 MG/3ML) 0.083% nebulizer solution, Take 3 mLs (2.5 mg total) by nebulization every 4 (four) hours as needed for wheezing or shortness of breath (coughing fits)., Disp: 75 mL, Rfl: 1   albuterol (VENTOLIN HFA) 108 (90 Base) MCG/ACT inhaler, USE 2 PUFFS EVERY 4 HOURS AS NEEDED FOR COUGH, WHEEZE, TIGHTNESS IN CHEST, OR SHORTNESS OF BREATH, Disp: 18 each,  Rfl: 1   ascorbic acid (VITAMIN C) 1000 MG tablet, Take by mouth., Disp: , Rfl:    aspirin-acetaminophen-caffeine (EXCEDRIN MIGRAINE) 824-235-36 MG tablet, Take by mouth., Disp: , Rfl:    azelastine (ASTELIN) 0.1 % nasal spray, Place into both nostrils 2 (two) times daily. Use in each nostril as directed, Disp: , Rfl:    b complex vitamins capsule, Take 1 capsule by mouth daily., Disp: , Rfl:    budesonide-formoterol (SYMBICORT) 80-4.5 MCG/ACT inhaler, Inhale 2 puffs into the lungs in the morning and at bedtime. with spacer and rinse mouth afterwards., Disp: 1 each, Rfl:  5   calcium citrate-vitamin D (CITRACAL+D) 315-200 MG-UNIT tablet, Take by mouth., Disp: , Rfl:    cetirizine (ZYRTEC) 10 MG tablet, Take 1 tablet (10 mg total) by mouth daily., Disp: 30 tablet, Rfl: 11   Cholecalciferol (VITAMIN D3) 125 MCG (5000 UT) CAPS, Take 5,000 Units by mouth daily., Disp: , Rfl:    cycloSPORINE (RESTASIS) 0.05 % ophthalmic emulsion, , Disp: , Rfl:    Dermatological Products, Misc. (SUVICORT) EMUL, , Disp: , Rfl:    DHEA 25 MG CAPS, Take by mouth., Disp: , Rfl:    fluticasone (FLONASE) 50 MCG/ACT nasal spray, Place 2 sprays into both nostrils daily., Disp: 16 g, Rfl: 6   hyoscyamine (LEVSIN SL) 0.125 MG SL tablet, Place under the tongue every 4 (four) hours as needed., Disp: , Rfl:    ipratropium (ATROVENT) 0.06 % nasal spray, Place 1-2 sprays into both nostrils 3 (three) times daily as needed (drainage)., Disp: 15 mL, Rfl: 5   lidocaine (XYLOCAINE) 2 % jelly, APPLY TOPICALLY TO AFFECTED AREA EVERY DAY AS NEEDED (use sparingly), Disp: 85 g, Rfl: 0   Lidocaine-Collagen-Aloe Vera (REGENECARE) 2 % GEL, Apply topically daily., Disp: 85 g, Rfl: 2   LORazepam (ATIVAN) 0.5 MG tablet, Take 1 tablet (0.5 mg total) by mouth 2 (two) times daily as needed for anxiety., Disp: 30 tablet, Rfl: 2   magnesium oxide (MAG-OX) 400 MG tablet, Take by mouth., Disp: , Rfl:    melatonin 5 MG TABS, Take 5 mg by mouth., Disp: , Rfl:    mometasone (NASONEX) 50 MCG/ACT nasal spray, Place 1 spray into the nose 2 (two) times daily as needed (nasal congestion)., Disp: 1 each, Rfl: 5   Polyethylene Glycol 3350 (MIRALAX PO), Take by mouth as needed., Disp: , Rfl:    Probiotic Product (PROBIOTIC PO), Take by mouth daily., Disp: , Rfl:    Simethicone (GAS-X PO), Take by mouth., Disp: , Rfl:    tizanidine (ZANAFLEX) 2 MG capsule, Take 1-2 capsules (2-4 mg total) by mouth 3 (three) times daily as needed for muscle spasms., Disp: 60 capsule, Rfl: 2   traMADol (ULTRAM) 50 MG tablet, Take 1 tablet (50 mg total)  by mouth every 12 (twelve) hours as needed., Disp: 30 tablet, Rfl: 1   UNABLE TO FIND, Take 1 capsule by mouth daily. Dv3- vitamin D 3 plus immune support, Disp: , Rfl:   Current Facility-Administered Medications:    zoledronic acid (RECLAST) injection 5 mg, 5 mg, Intravenous, Once, [name], DO Social History   Socioeconomic History   Marital status: Married    Spouse name: [name]   Number of children: 3   Years of education: 12   Highest education level: Some college, no degree  Occupational History   Occupation: CNA    Comment: Retired  Tobacco Use   Smoking status: Never   Smokeless tobacco: Never  Vaping  Use   Vaping Use: Never used  Substance and Sexual Activity   Alcohol use: No    Alcohol/week: 0.0 standard drinks of alcohol    Comment: 01-19-2016 per pt no   Drug use: No    Comment: 01-19-2016 per pt no    Sexual activity: Not Currently    Birth control/protection: Surgical  Other Topics Concern   Not on file  Social History Narrative   Lives with husband.    Social Determinants of Health   Financial Resource Strain: Low Risk  (09/13/2019)   Overall Financial Resource Strain (CARDIA)    Difficulty of Paying Living Expenses: Not hard at all  Food Insecurity: No Food Insecurity (09/13/2019)   Hunger Vital Sign    Worried About Running Out of Food in the Last Year: Never true    Ran Out of Food in the Last Year: Never true  Transportation Needs: Unmet Transportation Needs (03/08/2021)   PRAPARE - Transportation    Lack of Transportation (Medical): No    Lack of Transportation (Non-Medical): Yes  Physical Activity: Inactive (05/03/2021)   Exercise Vital Sign    Days of Exercise per Week: 0 days    Minutes of Exercise per Session: 0 min  Stress: Stress Concern Present (05/03/2021)   [place]    Feeling of Stress : Rather much  Social Connections: Socially Integrated (09/13/2019)   Social  Connection and Isolation Panel [NHANES]    Frequency of Communication with Friends and Family: More than three times a week    Frequency of Social Gatherings with Friends and Family: More than three times a week    Attends Religious Services: More than 4 times per year    Active Member of [employer] or Organizations: Yes    Attends Music therapist: More than 4 times per year    Marital Status: Married  Human resources officer Violence: Not At Risk (09/13/2019)   Humiliation, Afraid, Rape, and Kick questionnaire    Fear of Current or Ex-Partner: No    Emotionally Abused: No    Physically Abused: No    Sexually Abused: No   Family History  Problem Relation Age of Onset   Hyperlipidemia Mother    Arthritis Mother    Cancer Mother    Lymphoma Mother    Cancer Father    Allergies Father    Allergies Sister    Allergies Sister    Allergic rhinitis Neg Hx    Angioedema Neg Hx    Asthma Neg Hx    Eczema Neg Hx    Immunodeficiency Neg Hx    Urticaria Neg Hx     Objective: Office vital signs reviewed. BP 108/70   Pulse 86   Temp 98.1 F (36.7 C)   Ht '5\' 7"'$  (1.702 m)   Wt 112 lb 12.8 oz (51.2 kg)   SpO2 97%   BMI 17.67 kg/m   Physical Examination:  General: Awake, alert, underweight, elderly female, No acute distress HEENT: Sclera white. Cardio: regular rate and rhythm, S1S2 heard, no murmurs appreciated Pulm: clear to auscultation bilaterally, no wheezes, rhonchi or rales; normal work of breathing on room air Psych: Mood is stable but she is somewhat anxious appearing when she talks about her anxiety and the guilt that she feels with regards to her mother'Kristy health and decline     10/22/2021   10:57 AM 10/14/2021    3:23 PM 10/01/2021   10:00 AM  Depression screen  PHQ 2/9  Decreased Interest 0 0 0  Down, Depressed, Hopeless 0 0 0  PHQ - 2 Score 0 0 0  Altered sleeping '1 2 1  '$ Tired, decreased energy '1 2 1  '$ Change in appetite 0 1 0  Feeling bad or failure about yourself   0 0 0  Trouble concentrating 0 0 0  Moving slowly or fidgety/restless 0 0 0  Suicidal thoughts 0 0 0  PHQ-9 Score '2 5 2  '$ Difficult doing work/chores Not difficult at all Not difficult at all Somewhat difficult      10/22/2021   10:57 AM 10/14/2021    3:22 PM 10/01/2021   10:00 AM 09/02/2021    2:26 PM  GAD 7 : Generalized Anxiety Score  Nervous, Anxious, on Edge 0 0 0 0  Control/stop worrying 0 0 0 0  Worry too much - different things 0 0 0 0  Trouble relaxing 0 0 0 0  Restless 0 0 0 0  Easily annoyed or irritable 0 0 0 0  Afraid - awful might happen 0 0 0 0  Total GAD 7 Score 0 0 0 0  Anxiety Difficulty Not difficult at all Not difficult at all Not difficult at all Not difficult at all      Assessment/ Plan: 71 y.o. female   Depression with anxiety - Plan: Ambulatory referral to Psychology, Ambulatory referral to Psychiatry, LORazepam (ATIVAN) 0.5 MG tablet  Sleep difficulties - Plan: Ambulatory referral to Psychology, Ambulatory referral to Psychiatry, LORazepam (ATIVAN) 0.5 MG tablet  Guilty feelings - Plan: Ambulatory referral to Psychology, Ambulatory referral to Psychiatry  Given multiple medication failures and ongoing uncontrolled symptoms despite fairly normal PHQ and GAD-7 score, I think that she would benefit from psychiatric evaluation/medication management and therapy sessions.  Referrals have been placed accordingly.  I have renewed her Ativan for the next several months.  Continue to use sparingly.  Last refill was April 2023.  She understands the high risk nature of benzodiazepines but at this point she really has not tolerated many of the other things that we have tried.  National narcotic database reviewed and there were no red flags.  Up-to-date on UDS and CSC  No orders of the defined types were placed in this encounter.  No orders of the defined types were placed in this encounter.    [name], DO [place] [phone]

## 2021-10-25 ENCOUNTER — Ambulatory Visit: Payer: Medicare HMO | Admitting: Nurse Practitioner

## 2021-10-25 ENCOUNTER — Encounter: Payer: Self-pay | Admitting: Nurse Practitioner

## 2021-10-25 VITALS — BP 110/71 | HR 68 | Ht 67.0 in | Wt 114.0 lb

## 2021-10-25 DIAGNOSIS — R7989 Other specified abnormal findings of blood chemistry: Secondary | ICD-10-CM

## 2021-10-25 DIAGNOSIS — E0590 Thyrotoxicosis, unspecified without thyrotoxic crisis or storm: Secondary | ICD-10-CM

## 2021-10-25 NOTE — Progress Notes (Signed)
10/25/2021     Endocrinology Follow Up Note    Subjective:    Patient ID: Kristy Garza Kitchen, female    DOB: 1951-01-11, PCP [name], DO.   Past Medical History:  Diagnosis Date   Abdominal pain 01/16/2013   Acid reflux    Allergy    Arthritis    ARTHRITIS IN NECK BY DR. Alroy Dust [facility]   Asthma    Interstitial cystitis    Osteoporosis    PONV (postoperative nausea and vomiting)    Tinnitus    Trigeminal neuralgia    Atypical trigeminal neuralgia    Past Surgical History:  Procedure Laterality Date   ABDOMINAL HYSTERECTOMY  2000   CYSTOSCOPY WITH HYDRODISTENSION AND BIOPSY N/A 06/11/2012   Procedure: CYSTOSCOPY/BIOPSY/HYDRODISTENSION with Instillation of Pyridium and Marcaine and Kenalog;  Surgeon: Ailene Rud, MD;  Location: [facility];  Service: Urology;  Laterality: N/A;  1 hour requested for this case  BLADDER BIOPSY   ETHMOIDECTOMY  2012   SEPTOPLASTY  1980's   vocal cord surgery   1990's   polyp removal    Social History   Socioeconomic History   Marital status: Married    Spouse name: [name]   Number of children: 3   Years of education: 12   Highest education level: Some college, no degree  Occupational History   Occupation: CNA    Comment: Retired  Tobacco Use   Smoking status: Never   Smokeless tobacco: Never  Vaping Use   Vaping Use: Never used  Substance and Sexual Activity   Alcohol use: No    Alcohol/week: 0.0 standard drinks of alcohol    Comment: 01-19-2016 per pt no   Drug use: No    Comment: 01-19-2016 per pt no    Sexual activity: Not Currently    Birth control/protection: Surgical  Other Topics Concern   Not on file  Social History Narrative   Lives with husband.    Social Determinants of Health   Financial Resource Strain: Low Risk  (09/13/2019)   Overall Financial Resource Strain (CARDIA)    Difficulty of Paying Living Expenses: Not hard at all  Food Insecurity: No Food Insecurity  (09/13/2019)   Hunger Vital Sign    Worried About Running Out of Food in the Last Year: Never true    Ran Out of Food in the Last Year: Never true  Transportation Needs: Unmet Transportation Needs (03/08/2021)   PRAPARE - Transportation    Lack of Transportation (Medical): No    Lack of Transportation (Non-Medical): Yes  Physical Activity: Inactive (05/03/2021)   Exercise Vital Sign    Days of Exercise per Week: 0 days    Minutes of Exercise per Session: 0 min  Stress: Stress Concern Present (05/03/2021)   [place]oyd    Feeling of Stress : Rather much  Social Connections: Socially Integrated (09/13/2019)   Social Connection and Isolation Panel [NHANES]    Frequency of Communication with Friends and Family: More than three times a week    Frequency of Social Gatherings with Friends and Family: More than three times a week    Attends Religious Services: More than 4 times per year    Active Member of [employer] or Organizations: Yes    Attends Music therapist: More than 4 times per year    Marital Status: Married    Family History  Problem Relation Age of Onset  Hyperlipidemia Mother    Arthritis Mother    Cancer Mother    Lymphoma Mother    Cancer Father    Allergies Father    Allergies Sister    Allergies Sister    Allergic rhinitis Neg Hx    Angioedema Neg Hx    Asthma Neg Hx    Eczema Neg Hx    Immunodeficiency Neg Hx    Urticaria Neg Hx     Outpatient Encounter Medications as of 10/25/2021  Medication Sig   acetaminophen (TYLENOL) 500 MG tablet Take by mouth.   albuterol (VENTOLIN HFA) 108 (90 Base) MCG/ACT inhaler USE 2 PUFFS EVERY 4 HOURS AS NEEDED FOR COUGH, WHEEZE, TIGHTNESS IN CHEST, OR SHORTNESS OF BREATH   ascorbic acid (VITAMIN C) 1000 MG tablet Take by mouth.   aspirin-acetaminophen-caffeine (EXCEDRIN MIGRAINE) 250-250-65 MG tablet Take by mouth.   azelastine (ASTELIN) 0.1 % nasal spray  Place into both nostrils 2 (two) times daily. Use in each nostril as directed   b complex vitamins capsule Take 1 capsule by mouth daily.   budesonide-formoterol (SYMBICORT) 80-4.5 MCG/ACT inhaler Inhale 2 puffs into the lungs in the morning and at bedtime. with spacer and rinse mouth afterwards.   calcium citrate-vitamin D (CITRACAL+D) 315-200 MG-UNIT tablet Take by mouth.   cetirizine (ZYRTEC) 10 MG tablet Take 1 tablet (10 mg total) by mouth daily.   Cholecalciferol (VITAMIN D3) 125 MCG (5000 UT) CAPS Take 5,000 Units by mouth daily.   cycloSPORINE (RESTASIS) 0.05 % ophthalmic emulsion    Dermatological Products, Misc. (SUVICORT) EMUL    fluticasone (FLONASE) 50 MCG/ACT nasal spray Place 2 sprays into both nostrils daily.   hyoscyamine (LEVSIN SL) 0.125 MG SL tablet Place under the tongue every 4 (four) hours as needed.   ipratropium (ATROVENT) 0.06 % nasal spray Place 1-2 sprays into both nostrils 3 (three) times daily as needed (drainage).   lidocaine (XYLOCAINE) 2 % jelly APPLY TOPICALLY TO AFFECTED AREA EVERY DAY AS NEEDED (use sparingly)   Lidocaine-Collagen-Aloe Vera (REGENECARE) 2 % GEL Apply topically daily.   LORazepam (ATIVAN) 0.5 MG tablet Take 1 tablet (0.5 mg total) by mouth 2 (two) times daily as needed for anxiety.   magnesium oxide (MAG-OX) 400 MG tablet Take by mouth.   melatonin 5 MG TABS Take 5 mg by mouth.   mometasone (NASONEX) 50 MCG/ACT nasal spray Place 1 spray into the nose 2 (two) times daily as needed (nasal congestion).   Polyethylene Glycol 3350 (MIRALAX PO) Take by mouth as needed.   Probiotic Product (PROBIOTIC PO) Take by mouth daily.   Simethicone (GAS-X PO) Take by mouth.   tizanidine (ZANAFLEX) 2 MG capsule Take 1-2 capsules (2-4 mg total) by mouth 3 (three) times daily as needed for muscle spasms.   traMADol (ULTRAM) 50 MG tablet Take 1 tablet (50 mg total) by mouth every 12 (twelve) hours as needed.   UNABLE TO FIND Take 1 capsule by mouth daily. Dv3-  vitamin D 3 plus immune support   [DISCONTINUED] albuterol (PROVENTIL) (2.5 MG/3ML) 0.083% nebulizer solution Take 3 mLs (2.5 mg total) by nebulization every 4 (four) hours as needed for wheezing or shortness of breath (coughing fits).   [DISCONTINUED] DHEA 25 MG CAPS Take by mouth.   Facility-Administered Encounter Medications as of 10/25/2021  Medication   zoledronic acid (RECLAST) injection 5 mg    ALLERGIES: Allergies  Allergen Reactions   Cefuroxime Axetil Other (See Comments)    Extreme gas   Gabapentin Other (See Comments)  Constipation Constipation   Montelukast Other (See Comments)    Numbness and tingling in hand. Numbness and tingling in hand. Numbness and tingling in hand. NUMBNESS OF HANDS AND FEET. Numbness in hands and feet NUMBNESS OF HANDS AND FEET.   Pregabalin Other (See Comments)    Drowsiness, dry mouth Drowsiness, dry mouth Makes her tired and thirsty   Augmentin [Amoxicillin-Pot Clavulanate] Diarrhea   Avelox [Moxifloxacin Hcl In Nacl] Other (See Comments)    Tremors    Biaxin [Clarithromycin]     Unsure of reaction   Cedax [Ceftibuten] Other (See Comments)    tremors   Ciprofloxacin Other (See Comments) and Nausea And Vomiting    Blurred vision Blurred vision Pt can't remember reaction   Clindamycin/Lincomycin     tachycardia   Levofloxacin Other (See Comments)    Feels dehydrated   Lincomycin Other (See Comments)    tachycardia   Montelukast Sodium Other (See Comments)    Numbness and tingling in hand. Numbness and tingling in hand.   Sulfonamide Derivatives Other (See Comments)    Gi upset   Sulfa Antibiotics Nausea Only    Other reaction(s): Other (See Comments) Gi upset  Other reaction(s): Other (See Comments) Gi upset Other reaction(s): Unknown    VACCINATION STATUS: Immunization History  Administered Date(s) Administered   Fluad Quad(high Dose 65+) 02/12/2021   Influenza,inj,Quad PF,6+ Mos 12/28/2012, 12/25/2013,  12/26/2014   PFIZER(Purple Top)SARS-COV-2 Vaccination 01/01/2020, 02/05/2020   Pneumococcal Conjugate-13 02/07/2013   Pneumococcal Polysaccharide-23 09/07/2017   Tdap 05/21/2010   Zoster, Live 03/27/2012     HPI  Kristy Garza is 71 y.o. female who presents today with a medical history as above. she is being seen in follow up after being seen in consultation for hyperthyroidism requested by [name], DO.  she has been dealing with symptoms of anxiety for several months now but she reports she is under a great amount of stress as her mothers health isnt well and she will likely pass soon. 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   Upon reviewing her referral, she was noticed to have slightly suppressed TSH dating back as far as 106.  She has never had any imaging studies of her thyroid in the past.   she denies family history of thyroid dysfunction and denies family hx of thyroid cancer. she denies personal history of goiter. she is not on any anti-thyroid medications nor on any thyroid hormone supplements. Denies use of Biotin containing supplements.  she is willing to proceed with appropriate work up and therapy for thyrotoxicosis.   Review of systems  Constitutional: + Minimally fluctuating body weight, current Body mass index is 17.85 kg/m., no fatigue, no subjective hyperthermia, no subjective hypothermia Eyes: no blurry vision, no xerophthalmia ENT: no sore throat, no nodules palpated in throat, no dysphagia/odynophagia, + hoarseness- chronic Cardiovascular: no chest pain, no shortness of breath, no palpitations, no leg swelling Respiratory: no cough, no shortness of breath Gastrointestinal: no nausea/vomiting/diarrhea Musculoskeletal: + reports generalized weakness Skin: no rashes, no hyperemia Neurological: no tremors, no  numbness, no tingling, no dizziness Psychiatric: no depression, + anxiety and related insomnia   Objective:    BP 110/71   Pulse 68   Ht '5\' 7"'$  (1.702 m)   Wt 114 lb (51.7 kg)   BMI 17.85 kg/m   Wt Readings from Last 3 Encounters:  10/25/21 114 lb (51.7 kg)  10/22/21 112 lb 12.8 oz (51.2 kg)  10/14/21 112 lb (50.8 kg)     BP Readings from Last 3 Encounters:  10/25/21 110/71  10/22/21 108/70  10/14/21 121/79                        Physical Exam- Limited  Constitutional:  Body mass index is 17.85 kg/m.-slightly malnourished appearance, not in acute distress, normal state of mind, + raspy voice-continued Eyes:  EOMI, no exophthalmos Neck: Supple Cardiovascular: RRR, no murmurs, rubs, or gallops, no edema Respiratory: Adequate breathing efforts, no crackles, rales, rhonchi, or wheezing Musculoskeletal: no gross deformities, strength intact in all four extremities, no gross restriction of joint movements Skin:  no rashes, no hyperemia Neurological: slight tremor in right hand    CMP     Component Value Date/Time   NA 140 07/26/2021 1039   K 4.5 07/26/2021 1039   CL 102 07/26/2021 1039   CO2 24 07/26/2021 1039   GLUCOSE 92 07/26/2021 1039   GLUCOSE 95 03/25/2013 1052   BUN 6 (L) 07/26/2021 1039   CREATININE 0.74 07/26/2021 1039   CALCIUM 9.4 07/26/2021 1039   PROT 6.8 07/26/2021 1039   ALBUMIN 4.6 07/26/2021 1039   AST 23 07/26/2021 1039   ALT 15 07/26/2021 1039   ALKPHOS 53 07/26/2021 1039   BILITOT 0.4 07/26/2021 1039   GFRNONAA 84 11/18/2019 0914   GFRAA 96 11/18/2019 0914     CBC    Component Value Date/Time   WBC 4.6 07/26/2021 1039   WBC 4.9 06/16/2014 1338   WBC 5.9 10/05/2012 1228   RBC 4.13 07/26/2021 1039   RBC 3.82 (A) 06/16/2014 1338   RBC 4.07 10/05/2012 1228   RBC 4.07 10/05/2012 1228   HGB 12.4 07/26/2021 1039   HCT 38.1 07/26/2021 1039   PLT 211 07/26/2021 1039   MCV 92 07/26/2021 1039   MCH 30.0 07/26/2021 1039   MCH 30.3  06/16/2014 1338   MCH 29.7 10/05/2012 1228   MCHC 32.5 07/26/2021 1039   MCHC 32.4 06/16/2014 1338   MCHC 33.8 10/05/2012 1228   RDW 11.7 07/26/2021 1039   LYMPHSABS 0.8 03/15/2021 1647   MONOABS 1.0 06/23/2011 0110   EOSABS 0.1 03/15/2021 1647   BASOSABS 0.0 03/15/2021 1647     Diabetic Labs (most recent): Lab Results  Component Value Date   HGBA1C 5.1 06/15/2020    Lipid Panel     Component Value Date/Time   CHOL 181 07/26/2021 1039   TRIG 60 07/26/2021 1039   HDL 75 07/26/2021 1039   CHOLHDL 2.4 07/26/2021 1039   LDLCALC 94 07/26/2021 1039   LDLDIRECT 99 02/14/2014 1047   LABVLDL 12 07/26/2021 1039     Lab Results  Component Value Date   TSH 0.335 (L) 10/20/2021   TSH 0.530 04/12/2021   TSH 0.440 (L) 01/26/2021   TSH 0.326 (L) 06/15/2020   TSH 0.467 11/18/2019   TSH 0.483 08/28/2018   TSH 0.474 06/12/2017   TSH 0.497 01/08/2016   TSH 0.377 (L) 04/08/2015   TSH 0.329 (L) 11/26/2014   FREET4 1.42 10/20/2021   FREET4 1.15 04/12/2021   FREET4 1.30 01/26/2021   FREET4 1.25 06/15/2020   FREET4 1.42 01/08/2016   FREET4 1.42 07/30/2013      Latest Reference Range & Units 11/18/19 09:14 06/15/20 09:22 01/26/21 08:46 04/12/21 14:30 10/20/21 10:49  TSH 0.450 - 4.500 uIU/mL 0.467 0.326 (L) 0.440 (L) 0.530 0.335 (  L)  Triiodothyronine,Free,Serum 2.0 - 4.4 pg/mL    2.4 2.2  T4,Free(Direct) 0.82 - 1.77 ng/dL  1.25 1.30 1.15 1.42  Thyroperoxidase Ab SerPl-aCnc 0 - 34 IU/mL    11   Thyroglobulin Antibody 0.0 - 0.9 IU/mL    <1.0   (L): Data is abnormally low   Assessment & Plan:   1. Abnormal TSH 2. Subclinical hyperthyroidism  she is being seen at a kind request of [name], DO.  Her antibody testing rules out autoimmune thyroid dysfunction.  Her repeat thyroid function tests show slightly suppressed TSH, but consistent where she has been before, and normal Free T3 and Free T4 values.  She does not need any intervention at this time.  -There is no  need for thyroid imaging at this time.  Will plan to repeat thyroid function tests in 6 months for surveillance purposes.    -Patient is advised to maintain close follow up with [name], DO for primary care needs. She could benefit from seeing nutritionist for malabsorption syndrome given her small size and complicated GI history.  I did enter order for this today.     I spent 32 minutes in the care of the patient today including review of labs from Thyroid Function, CMP, and other relevant labs ; imaging/biopsy records (current and previous including abstractions from other facilities); face-to-face time discussing  her lab results and symptoms, medications doses, her options of short and long term treatment based on the latest standards of care / guidelines;   and documenting the encounter.  Kristy Garza Kitchen  participated in the discussions, expressed understanding, and voiced agreement with the above plans.  All questions were answered to her satisfaction. she is encouraged to contact clinic should she have any questions or concerns prior to her return visit.   Follow up plan: Return in about 6 months (around 04/27/2022) for Thyroid follow up, Previsit labs.   Thank you for involving me in the care of this pleasant patient, and I will continue to update you with her progress.    Rayetta Pigg, [facility] [address] [place], [place] [address] Phone: [phone] Fax: [phone]  10/25/2021, 11:04 AM

## 2021-10-27 ENCOUNTER — Ambulatory Visit (INDEPENDENT_AMBULATORY_CARE_PROVIDER_SITE_OTHER): Payer: Medicare HMO | Admitting: Family Medicine

## 2021-10-27 ENCOUNTER — Encounter: Payer: Self-pay | Admitting: Family Medicine

## 2021-10-27 VITALS — BP 112/69 | HR 79 | Temp 97.8°F | Ht 67.0 in | Wt 111.8 lb

## 2021-10-27 DIAGNOSIS — M26623 Arthralgia of bilateral temporomandibular joint: Secondary | ICD-10-CM

## 2021-10-27 DIAGNOSIS — R300 Dysuria: Secondary | ICD-10-CM

## 2021-10-27 LAB — URINALYSIS, COMPLETE
Bilirubin, UA: NEGATIVE
Glucose, UA: NEGATIVE
Ketones, UA: NEGATIVE
Nitrite, UA: NEGATIVE
Protein,UA: NEGATIVE
RBC, UA: NEGATIVE
Specific Gravity, UA: <1.005 — ABNORMAL LOW (ref 1.005–1.030)
Urobilinogen, Ur: 0.2 mg/dL (ref 0.2–1.0)
pH, UA: 7 (ref 5.0–7.5)

## 2021-10-27 LAB — MICROSCOPIC EXAMINATION
Epithelial Cells (non renal): NONE SEEN /hpf (ref 0–10)
RBC, Urine: NONE SEEN /hpf (ref 0–2)
Renal Epithel, UA: NONE SEEN /hpf
WBC, UA: NONE SEEN /hpf (ref 0–5)

## 2021-10-27 NOTE — Patient Instructions (Signed)
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

## 2021-10-27 NOTE — Progress Notes (Signed)
Subjective:  Patient ID: Kristy Garza, female    DOB: June 06, 1950, 71 y.o.   MRN: [id number]  Patient Care Team: [name], DO as PCP - General (Family Medicine) Deneise Lever, MD as Consulting Physician (Pulmonary Disease) Minus Breeding, MD as Consulting Physician (Cardiology) Shea Evans [name], LCSW as Social Worker (Licensed Clinical Social Worker)   Chief Complaint:  Dysuria   HPI: Kristy Garza is a 71 y.o. female presenting on 10/27/2021 for Dysuria   Pt presents today with complaints of dysuria for 2 days and worsening jaw clenching and TMJ pain. 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 States over the last few weeks the pain does not seem to go completely away with home medications. No other associated symptoms. States moderate and tight feeling in nature.   Dysuria  This is a new problem. The current episode started in the past 7 days. The problem occurs every urination. The problem has been waxing and waning. The quality of the pain is described as burning and aching. The pain is mild. There has been no fever. She is Not sexually active. There is No history of pyelonephritis. Pertinent negatives include no chills, discharge, flank pain, frequency, hematuria, hesitancy, nausea, possible pregnancy, sweats, urgency or vomiting. She has tried nothing for the symptoms.     Relevant past medical, surgical, family, and social history reviewed and updated as indicated.  Allergies and medications reviewed and updated. Data reviewed: Chart in Epic.   Past Medical History:  Diagnosis Date   Abdominal pain 01/16/2013   Acid reflux    Allergy    Arthritis    ARTHRITIS IN NECK BY DR. Alroy Dust [facility]   Asthma    Interstitial cystitis    Osteoporosis    PONV (postoperative nausea and vomiting)    Tinnitus    Trigeminal neuralgia    Atypical trigeminal neuralgia    Past Surgical History:  Procedure Laterality Date   ABDOMINAL  HYSTERECTOMY  2000   CYSTOSCOPY WITH HYDRODISTENSION AND BIOPSY N/A 06/11/2012   Procedure: CYSTOSCOPY/BIOPSY/HYDRODISTENSION with Instillation of Pyridium and Marcaine and Kenalog;  Surgeon: Ailene Rud, MD;  Location: [facility];  Service: Urology;  Laterality: N/A;  1 hour requested for this case  BLADDER BIOPSY   ETHMOIDECTOMY  2012   SEPTOPLASTY  1980's   vocal cord surgery   1990's   polyp removal    Social History   Socioeconomic History   Marital status: Married    Spouse name: [name]   Number of children: 3   Years of education: 12   Highest education level: Some college, no degree  Occupational History   Occupation: CNA    Comment: Retired  Tobacco Use   Smoking status: Never   Smokeless tobacco: Never  Vaping Use   Vaping Use: Never used  Substance and Sexual Activity   Alcohol use: No    Alcohol/week: 0.0 standard drinks of alcohol    Comment: 01-19-2016 per pt no   Drug use: No    Comment: 01-19-2016 per pt no    Sexual activity: Not Currently    Birth control/protection: Surgical  Other Topics Concern   Not on file  Social History Narrative   Lives with husband.    Social Determinants of Health   Financial Resource Strain: Low Risk  (09/13/2019)   Overall Financial Resource Strain (CARDIA)    Difficulty of Paying Living Expenses: Not hard at all  Food  Insecurity: No Food Insecurity (09/13/2019)   Hunger Vital Sign    Worried About Running Out of Food in the Last Year: Never true    [place] in the Last Year: Never true  Transportation Needs: Unmet Transportation Needs (03/08/2021)   PRAPARE - Transportation    Lack of Transportation (Medical): No    Lack of Transportation (Non-Medical): Yes  Physical Activity: Inactive (05/03/2021)   Exercise Vital Sign    Days of Exercise per Week: 0 days    Minutes of Exercise per Session: 0 min  Stress: Stress Concern Present (05/03/2021)   [place]    Feeling of Stress : Rather much  Social Connections: Socially Integrated (09/13/2019)   Social Connection and Isolation Panel [NHANES]    Frequency of Communication with Friends and Family: More than three times a week    Frequency of Social Gatherings with Friends and Family: More than three times a week    Attends Religious Services: More than 4 times per year    Active Member of [employer] or Organizations: Yes    Attends Music therapist: More than 4 times per year    Marital Status: Married  Human resources officer Violence: Not At Risk (09/13/2019)   Humiliation, Afraid, Rape, and Kick questionnaire    Fear of Current or Ex-Partner: No    Emotionally Abused: No    Physically Abused: No    Sexually Abused: No    Outpatient Encounter Medications as of 10/27/2021  Medication Sig   acetaminophen (TYLENOL) 500 MG tablet Take by mouth.   albuterol (VENTOLIN HFA) 108 (90 Base) MCG/ACT inhaler USE 2 PUFFS EVERY 4 HOURS AS NEEDED FOR COUGH, WHEEZE, TIGHTNESS IN CHEST, OR SHORTNESS OF BREATH   ascorbic acid (VITAMIN C) 1000 MG tablet Take by mouth.   aspirin-acetaminophen-caffeine (EXCEDRIN MIGRAINE) 250-250-65 MG tablet Take by mouth.   azelastine (ASTELIN) 0.1 % nasal spray Place into both nostrils 2 (two) times daily. Use in each nostril as directed   b complex vitamins capsule Take 1 capsule by mouth daily.   budesonide-formoterol (SYMBICORT) 80-4.5 MCG/ACT inhaler Inhale 2 puffs into the lungs in the morning and at bedtime. with spacer and rinse mouth afterwards.   calcium citrate-vitamin D (CITRACAL+D) 315-200 MG-UNIT tablet Take by mouth.   cetirizine (ZYRTEC) 10 MG tablet Take 1 tablet (10 mg total) by mouth daily.   Cholecalciferol (VITAMIN D3) 125 MCG (5000 UT) CAPS Take 5,000 Units by mouth daily.   cycloSPORINE (RESTASIS) 0.05 % ophthalmic emulsion    Dermatological Products, Misc. (SUVICORT) EMUL    fluticasone (FLONASE) 50 MCG/ACT nasal  spray Place 2 sprays into both nostrils daily.   hyoscyamine (LEVSIN SL) 0.125 MG SL tablet Place under the tongue every 4 (four) hours as needed.   ipratropium (ATROVENT) 0.06 % nasal spray Place 1-2 sprays into both nostrils 3 (three) times daily as needed (drainage).   lidocaine (XYLOCAINE) 2 % jelly APPLY TOPICALLY TO AFFECTED AREA EVERY DAY AS NEEDED (use sparingly)   Lidocaine-Collagen-Aloe Vera (REGENECARE) 2 % GEL Apply topically daily.   LORazepam (ATIVAN) 0.5 MG tablet Take 1 tablet (0.5 mg total) by mouth 2 (two) times daily as needed for anxiety.   magnesium oxide (MAG-OX) 400 MG tablet Take by mouth.   melatonin 5 MG TABS Take 5 mg by mouth.   mometasone (NASONEX) 50 MCG/ACT nasal spray Place 1 spray into the nose 2 (two) times daily as needed (nasal congestion).  Polyethylene Glycol 3350 (MIRALAX PO) Take by mouth as needed.   Probiotic Product (PROBIOTIC PO) Take by mouth daily.   Simethicone (GAS-X PO) Take by mouth.   tizanidine (ZANAFLEX) 2 MG capsule Take 1-2 capsules (2-4 mg total) by mouth 3 (three) times daily as needed for muscle spasms.   traMADol (ULTRAM) 50 MG tablet Take 1 tablet (50 mg total) by mouth every 12 (twelve) hours as needed.   UNABLE TO FIND Take 1 capsule by mouth daily. Dv3- vitamin D 3 plus immune support   Facility-Administered Encounter Medications as of 10/27/2021  Medication   zoledronic acid (RECLAST) injection 5 mg    Allergies  Allergen Reactions   Cefuroxime Axetil Other (See Comments)    Extreme gas   Gabapentin Other (See Comments)    Constipation Constipation   Montelukast Other (See Comments)    Numbness and tingling in hand. Numbness and tingling in hand. Numbness and tingling in hand. NUMBNESS OF HANDS AND FEET. Numbness in hands and feet NUMBNESS OF HANDS AND FEET.   Pregabalin Other (See Comments)    Drowsiness, dry mouth Drowsiness, dry mouth Makes her tired and thirsty   Augmentin [Amoxicillin-Pot Clavulanate] Diarrhea    Avelox [Moxifloxacin Hcl In Nacl] Other (See Comments)    Tremors    Biaxin [Clarithromycin]     Unsure of reaction   Cedax [Ceftibuten] Other (See Comments)    tremors   Ciprofloxacin Other (See Comments) and Nausea And Vomiting    Blurred vision Blurred vision Pt can't remember reaction   Clindamycin/Lincomycin     tachycardia   Levofloxacin Other (See Comments)    Feels dehydrated   Lincomycin Other (See Comments)    tachycardia   Montelukast Sodium Other (See Comments)    Numbness and tingling in hand. Numbness and tingling in hand.   Sulfonamide Derivatives Other (See Comments)    Gi upset   Sulfa Antibiotics Nausea Only    Other reaction(s): Other (See Comments) Gi upset  Other reaction(s): Other (See Comments) Gi upset Other reaction(s): Unknown    Review of Systems  Constitutional:  Negative for chills.  Gastrointestinal:  Negative for nausea and vomiting.  Genitourinary:  Positive for dysuria. Negative for flank pain, frequency, hematuria, hesitancy and urgency.        Objective:  BP 112/69   Pulse 79   Temp 97.8 F (36.6 C)   Ht '5\' 7"'$  (1.702 m)   Wt 111 lb 12.8 oz (50.7 kg)   SpO2 96%   BMI 17.51 kg/m    Wt Readings from Last 3 Encounters:  10/27/21 111 lb 12.8 oz (50.7 kg)  10/25/21 114 lb (51.7 kg)  10/22/21 112 lb 12.8 oz (51.2 kg)    Physical Exam  Results for orders placed or performed in visit on 09/02/21  Urinalysis, Routine w reflex microscopic  Result Value Ref Range   Specific Gravity, UA <1.005 (L) 1.005 - 1.030   pH, UA 6.0 5.0 - 7.5   Color, UA Yellow Yellow   Appearance Ur Clear Clear   Leukocytes,UA Negative Negative   Protein,UA Negative Negative/Trace   Glucose, UA Negative Negative   Ketones, UA Negative Negative   RBC, UA Negative Negative   Bilirubin, UA Negative Negative   Urobilinogen, Ur 0.2 0.2 - 1.0 mg/dL   Nitrite, UA Negative Negative   *Note: Due to a large number of results and/or encounters for the  requested time period, some results have not been displayed. A complete set of results can  be found in Results Review.       Pertinent labs & imaging results that were available during my care of the patient were reviewed by me and considered in my medical decision making.  Assessment & Plan:  Kristy Garza was seen today for urinary tract infection.  Diagnoses and all orders for this visit:  Dysuria Urinalysis with 2+ leukocytes in office, otherwise unremarkable. Culture pending, will treat if warranted. Encouraged to increase water intake.  -     Urinalysis, Complete -     Urine Culture  Bilateral temporomandibular joint pain Advised pt to speak with dentist about possible neurotoxin injections into masseters to help relieve pain and clenching. Toradol 30 mg IM given in office.     Continue all other maintenance medications.  Follow up plan: Return if symptoms worsen or fail to improve.   Continue healthy lifestyle choices, including diet (rich in fruits, vegetables, and lean proteins, and low in salt and simple carbohydrates) and exercise (at least 30 minutes of moderate physical activity daily).  Educational handout given for TMJ  The above assessment and management plan was discussed with the patient. The patient verbalized understanding of and has agreed to the management plan. Patient is aware to call the clinic if they develop any new symptoms or if symptoms persist or worsen. Patient is aware when to return to the clinic for a follow-up visit. Patient educated on when it is appropriate to go to the emergency department.   Monia Pouch, FNP-C [facility] [phone]

## 2021-10-28 ENCOUNTER — Telehealth: Payer: Self-pay | Admitting: Family Medicine

## 2021-10-28 DIAGNOSIS — M9902 Segmental and somatic dysfunction of thoracic region: Secondary | ICD-10-CM | POA: Diagnosis not present

## 2021-10-28 DIAGNOSIS — M5137 Other intervertebral disc degeneration, lumbosacral region: Secondary | ICD-10-CM | POA: Diagnosis not present

## 2021-10-28 DIAGNOSIS — M9901 Segmental and somatic dysfunction of cervical region: Secondary | ICD-10-CM | POA: Diagnosis not present

## 2021-10-28 DIAGNOSIS — M9903 Segmental and somatic dysfunction of lumbar region: Secondary | ICD-10-CM | POA: Diagnosis not present

## 2021-10-28 NOTE — Telephone Encounter (Signed)
Left pt vm for cb

## 2021-10-29 LAB — URINE CULTURE: Organism ID, Bacteria: NO GROWTH

## 2021-10-30 DIAGNOSIS — M542 Cervicalgia: Secondary | ICD-10-CM | POA: Diagnosis not present

## 2021-10-30 DIAGNOSIS — M26603 Bilateral temporomandibular joint disorder, unspecified: Secondary | ICD-10-CM | POA: Diagnosis not present

## 2021-11-01 NOTE — Telephone Encounter (Signed)
Tried to call patient again, still unable to reach.  Encounter closed. 

## 2021-11-02 DIAGNOSIS — R69 Illness, unspecified: Secondary | ICD-10-CM | POA: Diagnosis not present

## 2021-11-02 DIAGNOSIS — K146 Glossodynia: Secondary | ICD-10-CM | POA: Diagnosis not present

## 2021-11-02 DIAGNOSIS — M26629 Arthralgia of temporomandibular joint, unspecified side: Secondary | ICD-10-CM | POA: Diagnosis not present

## 2021-11-02 DIAGNOSIS — R1030 Lower abdominal pain, unspecified: Secondary | ICD-10-CM | POA: Diagnosis not present

## 2021-11-02 DIAGNOSIS — R350 Frequency of micturition: Secondary | ICD-10-CM | POA: Diagnosis not present

## 2021-11-02 DIAGNOSIS — N3010 Interstitial cystitis (chronic) without hematuria: Secondary | ICD-10-CM | POA: Diagnosis not present

## 2021-11-02 DIAGNOSIS — R82998 Other abnormal findings in urine: Secondary | ICD-10-CM | POA: Diagnosis not present

## 2021-11-02 DIAGNOSIS — N952 Postmenopausal atrophic vaginitis: Secondary | ICD-10-CM | POA: Diagnosis not present

## 2021-11-05 ENCOUNTER — Telehealth: Payer: Self-pay | Admitting: Family Medicine

## 2021-11-05 NOTE — Telephone Encounter (Signed)
Water, balanced diet and stretching is about all she can do for leg cramps.  Melatonin for sleep is a good option.  No more than '10mg'$  max.

## 2021-11-05 NOTE — Telephone Encounter (Signed)
Her pharmacy had '1mg'$  in stock of ativan

## 2021-11-05 NOTE — Telephone Encounter (Signed)
Number ending in [phone] will not go through Number ending in [place]- pt did not answer

## 2021-11-08 ENCOUNTER — Ambulatory Visit: Payer: Medicare HMO

## 2021-11-10 DIAGNOSIS — N3010 Interstitial cystitis (chronic) without hematuria: Secondary | ICD-10-CM | POA: Diagnosis not present

## 2021-11-10 DIAGNOSIS — M9902 Segmental and somatic dysfunction of thoracic region: Secondary | ICD-10-CM | POA: Diagnosis not present

## 2021-11-10 DIAGNOSIS — M9901 Segmental and somatic dysfunction of cervical region: Secondary | ICD-10-CM | POA: Diagnosis not present

## 2021-11-10 DIAGNOSIS — M9903 Segmental and somatic dysfunction of lumbar region: Secondary | ICD-10-CM | POA: Diagnosis not present

## 2021-11-10 DIAGNOSIS — M5137 Other intervertebral disc degeneration, lumbosacral region: Secondary | ICD-10-CM | POA: Diagnosis not present

## 2021-11-16 ENCOUNTER — Telehealth: Payer: Self-pay

## 2021-11-16 ENCOUNTER — Other Ambulatory Visit: Payer: Self-pay | Admitting: Family Medicine

## 2021-11-16 DIAGNOSIS — K581 Irritable bowel syndrome with constipation: Secondary | ICD-10-CM

## 2021-11-16 MED ORDER — TRULANCE 3 MG PO TABS: 1.0000 | ORAL_TABLET | Freq: Every day | ORAL | 12 refills | Status: DC

## 2021-11-16 NOTE — Telephone Encounter (Signed)
Access nurse call....  Patient states she is having issues with constipation.  Sunday she took Miralax twice and a Senna tablet and yesterday she felt bloated and her abdomen felt tight. Again yesterday she took Miralax twice and two doses of Colace.  She had a cloudy watery bowel movement last night.  Has also been eating prunes and drinking prune juice.  Any recommendations?

## 2021-11-16 NOTE — Telephone Encounter (Signed)
Pt made aware and understood.

## 2021-11-16 NOTE — Telephone Encounter (Signed)
Continue current regimen if her bowels are now moving.  If still having issues may need appt either here or with her GI as this has been a chronic issue for the patient.

## 2021-11-16 NOTE — Telephone Encounter (Signed)
Damaris Kings KeyRomeo Apple - PA Case ID: [id number] Need help? Call us at [phone] Outcome Appro[employer]today Your request has been approved Drug Trulance '3MG'$  tablets  Pharmacy aware

## 2021-11-16 NOTE — Telephone Encounter (Signed)
Informed pt to continue her current regimen, increase water, fruit and veggie intake.  Stomach pain, bloating and tight feeling yesterday. She did have BM during the night but attributes that to 2 tbs of MOM ( recommended by on call nurse).  Advised pt that she needs to schedule an appt with our office or see her GI physician if she is not improving. Pt states that GI don't really help her either.  Pt wants to know if Dr. [name] will prescribe Trulance for her? If so, send to [facility] in [place].

## 2021-11-16 NOTE — Telephone Encounter (Signed)
Hallelujah Aden KeyRomeo Apple - PA Case ID: [id number] Need help? Call us at [phone] Status Sent to [place] Drug Trulance '3MG'$  tablets

## 2021-11-16 NOTE — Telephone Encounter (Signed)
I have sent that over.  Though not sure her ins will actually cover it

## 2021-11-16 NOTE — Telephone Encounter (Signed)
Still unable to reach patient, encounter closed

## 2021-11-17 DIAGNOSIS — M9903 Segmental and somatic dysfunction of lumbar region: Secondary | ICD-10-CM | POA: Diagnosis not present

## 2021-11-17 DIAGNOSIS — M9901 Segmental and somatic dysfunction of cervical region: Secondary | ICD-10-CM | POA: Diagnosis not present

## 2021-11-17 DIAGNOSIS — M5137 Other intervertebral disc degeneration, lumbosacral region: Secondary | ICD-10-CM | POA: Diagnosis not present

## 2021-11-17 DIAGNOSIS — M9902 Segmental and somatic dysfunction of thoracic region: Secondary | ICD-10-CM | POA: Diagnosis not present

## 2021-11-22 ENCOUNTER — Encounter: Payer: Self-pay | Admitting: Family Medicine

## 2021-11-22 ENCOUNTER — Ambulatory Visit (INDEPENDENT_AMBULATORY_CARE_PROVIDER_SITE_OTHER): Payer: Medicare HMO | Admitting: Family Medicine

## 2021-11-22 VITALS — BP 113/70 | HR 80 | Temp 98.0°F | Ht 67.0 in | Wt 115.4 lb

## 2021-11-22 DIAGNOSIS — K581 Irritable bowel syndrome with constipation: Secondary | ICD-10-CM | POA: Diagnosis not present

## 2021-11-22 DIAGNOSIS — Z808 Family history of malignant neoplasm of other organs or systems: Secondary | ICD-10-CM | POA: Diagnosis not present

## 2021-11-22 NOTE — Patient Instructions (Addendum)
[place] at [facility]. [address], [place] - [place] [phone]  Low-FODMAP Eating Plan  FODMAP stands for fermentable oligosaccharides, disaccharides, monosaccharides, and polyols. These are sugars that are hard for some people to digest. A low-FODMAP eating plan may help some people who have irritable bowel syndrome (IBS) and certain other bowel (intestinal) diseases to manage their symptoms. This meal plan can be complicated to follow. Work with a diet and nutrition specialist (dietitian) to make a low-FODMAP eating plan that is right for you. A dietitian can help make sure that you get enough nutrition from this diet. What are tips for following this plan? Reading food labels Check labels for hidden FODMAPs such as: High-fructose syrup. Honey. Agave. Natural fruit flavors. Onion or garlic powder. Choose low-FODMAP foods that contain 3-4 grams of fiber per serving. Check food labels for serving sizes. Eat only one serving at a time to make sure FODMAP levels stay low. Shopping Shop with a list of foods that are recommended on this diet and make a meal plan. Meal planning Follow a low-FODMAP eating plan for up to 6 weeks, or as told by your health care provider or dietitian. To follow the eating plan: Eliminate high-FODMAP foods from your diet completely. Choose only low-FODMAP foods to eat. You will do this for 2-6 weeks. Gradually reintroduce high-FODMAP foods into your diet one at a time. Most people should wait a few days before introducing the next new high-FODMAP food into their meal plan. Your dietitian can recommend how quickly you may reintroduce foods. Keep a daily record of what and how much you eat and drink. Make note of any symptoms that you have after eating. Review your daily record with a dietitian regularly to identify which foods you can eat and which foods you should avoid. General tips Drink enough fluid each day to keep  your urine pale yellow. Avoid processed foods. These often have added sugar and may be high in FODMAPs. Avoid most dairy products, whole grains, and sweeteners. Work with a dietitian to make sure you get enough fiber in your diet. Avoid high FODMAP foods at meals to manage symptoms. Recommended foods Fruits Bananas, oranges, tangerines, lemons, limes, blueberries, raspberries, strawberries, grapes, cantaloupe, honeydew melon, kiwi, papaya, passion fruit, and pineapple. Limited amounts of dried cranberries, banana chips, and shredded coconut. Vegetables Eggplant, zucchini, cucumber, peppers, green beans, bean sprouts, lettuce, arugula, kale, Swiss chard, spinach, collard greens, bok choy, summer squash, potato, and tomato. Limited amounts of corn, carrot, and sweet potato. Green parts of scallions. Grains Gluten-free grains, such as rice, oats, buckwheat, quinoa, corn, polenta, and millet. Gluten-free pasta, bread, or cereal. Rice noodles. Corn tortillas. Meats and other proteins Unseasoned beef, pork, poultry, or fish. Eggs. Berniece Salines. Tofu (firm) and tempeh. Limited amounts of nuts and seeds, such as almonds, walnuts, [place] nuts, pecans, peanuts, nut butters, pumpkin seeds, chia seeds, and sunflower seeds. Dairy Lactose-free milk, yogurt, and kefir. Lactose-free cottage cheese and ice cream. Non-dairy milks, such as almond, coconut, hemp, and rice milk. Non-dairy yogurt. Limited amounts of goat cheese, brie, mozzarella, parmesan, swiss, and other hard cheeses. Fats and oils Butter-free spreads. Vegetable oils, such as olive, canola, and sunflower oil. Seasoning and other foods Artificial sweeteners with names that do not end in "ol," such as aspartame, saccharine, and stevia. Maple syrup, white table sugar, raw sugar, brown sugar, and molasses. Mayonnaise, soy sauce, and tamari. Fresh basil, coriander, parsley, rosemary, and thyme. Beverages Water and mineral water. Sugar-sweetened  soft drinks.  Small amounts of orange juice or cranberry juice. Black and green tea. Most dry wines. Coffee. The items listed above may not be a complete list of foods and beverages you can eat. Contact a dietitian for more information. Foods to avoid Fruits Fresh, dried, and juiced forms of apple, pear, watermelon, peach, plum, cherries, apricots, blackberries, boysenberries, figs, nectarines, and mango. Avocado. Vegetables Chicory root, artichoke, asparagus, cabbage, snow peas, Brussels sprouts, broccoli, sugar snap peas, mushrooms, celery, and cauliflower. Onions, garlic, leeks, and the white part of scallions. Grains Wheat, including kamut, durum, and semolina. Barley and bulgur. Couscous. Wheat-based cereals. Wheat noodles, bread, crackers, and pastries. Meats and other proteins Fried or fatty meat. Sausage. Cashews and pistachios. Soybeans, baked beans, black beans, chickpeas, kidney beans, fava beans, navy beans, lentils, black-eyed peas, and split peas. Dairy Milk, yogurt, ice cream, and soft cheese. Cream and sour cream. Milk-based sauces. Custard. Buttermilk. Soy milk. Seasoning and other foods Any sugar-free gum or candy. Foods that contain artificial sweeteners such as sorbitol, mannitol, isomalt, or xylitol. Foods that contain honey, high-fructose corn syrup, or agave. Bouillon, vegetable stock, beef stock, and chicken stock. Garlic and onion powder. Condiments made with onion, such as hummus, chutney, pickles, relish, salad dressing, and salsa. Tomato paste. Beverages Chicory-based drinks. Coffee substitutes. Chamomile tea. Fennel tea. Sweet or fortified wines such as port or sherry. Diet soft drinks made with isomalt, mannitol, maltitol, sorbitol, or xylitol. Apple, pear, and mango juice. Juices with high-fructose corn syrup. The items listed above may not be a complete list of foods and beverages you should avoid. Contact a dietitian for more information. Summary FODMAP stands for fermentable  oligosaccharides, disaccharides, monosaccharides, and polyols. These are sugars that are hard for some people to digest. A low-FODMAP eating plan is a short-term diet that helps to ease symptoms of certain bowel diseases. The eating plan usually lasts up to 6 weeks. After that, high-FODMAP foods are reintroduced gradually and one at a time. This can help you find out which foods may be causing symptoms. A low-FODMAP eating plan can be complicated. It is best to work with a dietitian who has experience with this type of plan. This information is not intended to replace advice given to you by your health care provider. Make sure you discuss any questions you have with your health care provider. Document Revised: 08/01/2019 Document Reviewed: 08/01/2019 Elsevier Patient Education  [place].

## 2021-11-22 NOTE — Progress Notes (Signed)
   Acute Office Visit  Subjective:     Patient ID: Kristy Garza, female    DOB: 22-Apr-1950, 71 y.o.   MRN: [id number]  Chief Complaint  Patient presents with   Bloated    HPI Patient is in today for bloating and constipation. She has a history of IBS. Two days ago she took some miralax, prunes, and dulcolax. She had a BM with this. She also had a BM today following miralax and feels better. Denies nausea, vomiting, fever, weight loss. She is established with GI.   She would like a referral to dermatology. Her father had melanoma and she would like to skin check.   ROS Negative unless specially indicated above in HPI.     Objective:    BP 113/70   Pulse 80   Temp 98 F (36.7 C) (Temporal)   Ht '5\' 7"'$  (1.702 m)   Wt 115 lb 6 oz (52.3 kg)   BMI 18.07 kg/m    Physical Exam Vitals and nursing note reviewed.  Constitutional:      General: She is not in acute distress.    Appearance: She is not ill-appearing, toxic-appearing or diaphoretic.  Cardiovascular:     Rate and Rhythm: Regular rhythm.  Pulmonary:     Effort: Pulmonary effort is normal. No respiratory distress.     Breath sounds: Normal breath sounds.  Abdominal:     General: Bowel sounds are normal. There is no distension.     Tenderness: There is no abdominal tenderness. There is no guarding or rebound.  Musculoskeletal:     Right lower leg: No edema.     Left lower leg: No edema.  Skin:    General: Skin is warm and dry.  Neurological:     General: No focal deficit present.     Mental Status: She is alert and oriented to person, place, and time.     No results found for any visits on 11/22/21.      Assessment & Plan:   Tahjanae was seen today for bloated.  Diagnoses and all orders for this visit:  Irritable bowel syndrome with constipation Discussed OTC treatments for constipation, diet, and hydration. She takes trulance daily for constipation. Established with GI.   Family history of malignant  melanoma of skin Referral placed.  -     Ambulatory referral to Dermatology   Follow up with GI and PCP as needed.   The patient indicates understanding of these issues and agrees with the plan.   Gwenlyn Perking, FNP

## 2021-11-24 DIAGNOSIS — M9902 Segmental and somatic dysfunction of thoracic region: Secondary | ICD-10-CM | POA: Diagnosis not present

## 2021-11-24 DIAGNOSIS — M9901 Segmental and somatic dysfunction of cervical region: Secondary | ICD-10-CM | POA: Diagnosis not present

## 2021-11-24 DIAGNOSIS — M9903 Segmental and somatic dysfunction of lumbar region: Secondary | ICD-10-CM | POA: Diagnosis not present

## 2021-11-24 DIAGNOSIS — M5137 Other intervertebral disc degeneration, lumbosacral region: Secondary | ICD-10-CM | POA: Diagnosis not present

## 2021-11-26 DIAGNOSIS — N3010 Interstitial cystitis (chronic) without hematuria: Secondary | ICD-10-CM | POA: Diagnosis not present

## 2021-12-02 DIAGNOSIS — N3010 Interstitial cystitis (chronic) without hematuria: Secondary | ICD-10-CM | POA: Diagnosis not present

## 2021-12-03 ENCOUNTER — Telehealth: Payer: Self-pay | Admitting: Family Medicine

## 2021-12-03 DIAGNOSIS — N3010 Interstitial cystitis (chronic) without hematuria: Secondary | ICD-10-CM | POA: Diagnosis not present

## 2021-12-03 NOTE — Telephone Encounter (Signed)
If taking 1 tablet daily, go to 1/2 tablet daily x3 weeks.  Then 1/2 tablet every other day x3 weeks then off.

## 2021-12-03 NOTE — Telephone Encounter (Signed)
Patient aware of recommendation.  

## 2021-12-06 ENCOUNTER — Ambulatory Visit: Payer: Medicare HMO | Admitting: Nurse Practitioner

## 2021-12-07 DIAGNOSIS — N3010 Interstitial cystitis (chronic) without hematuria: Secondary | ICD-10-CM | POA: Diagnosis not present

## 2021-12-09 DIAGNOSIS — M5137 Other intervertebral disc degeneration, lumbosacral region: Secondary | ICD-10-CM | POA: Diagnosis not present

## 2021-12-09 DIAGNOSIS — M9903 Segmental and somatic dysfunction of lumbar region: Secondary | ICD-10-CM | POA: Diagnosis not present

## 2021-12-09 DIAGNOSIS — M9902 Segmental and somatic dysfunction of thoracic region: Secondary | ICD-10-CM | POA: Diagnosis not present

## 2021-12-09 DIAGNOSIS — M9901 Segmental and somatic dysfunction of cervical region: Secondary | ICD-10-CM | POA: Diagnosis not present

## 2021-12-10 ENCOUNTER — Telehealth: Payer: Self-pay | Admitting: Family Medicine

## 2021-12-10 NOTE — Telephone Encounter (Signed)
This has been on ongoing issue for pts for a while now.  She's been trialed on linzess high dose in the past. Recommend follow up with GI if symptoms are refractory to all methods tried thus far

## 2021-12-10 NOTE — Telephone Encounter (Signed)
Pt aware of recommendations

## 2021-12-13 ENCOUNTER — Ambulatory Visit: Payer: Medicare HMO

## 2021-12-13 ENCOUNTER — Encounter: Payer: Self-pay | Admitting: Family Medicine

## 2021-12-13 ENCOUNTER — Ambulatory Visit (INDEPENDENT_AMBULATORY_CARE_PROVIDER_SITE_OTHER): Payer: Medicare HMO | Admitting: Family Medicine

## 2021-12-13 VITALS — BP 126/81 | HR 58 | Temp 97.6°F | Ht 67.0 in | Wt 109.8 lb

## 2021-12-13 DIAGNOSIS — R6889 Other general symptoms and signs: Secondary | ICD-10-CM | POA: Diagnosis not present

## 2021-12-13 DIAGNOSIS — R5383 Other fatigue: Secondary | ICD-10-CM

## 2021-12-13 DIAGNOSIS — W57XXXA Bitten or stung by nonvenomous insect and other nonvenomous arthropods, initial encounter: Secondary | ICD-10-CM | POA: Diagnosis not present

## 2021-12-13 DIAGNOSIS — G479 Sleep disorder, unspecified: Secondary | ICD-10-CM

## 2021-12-13 DIAGNOSIS — N3010 Interstitial cystitis (chronic) without hematuria: Secondary | ICD-10-CM | POA: Diagnosis not present

## 2021-12-13 NOTE — Progress Notes (Signed)
   Acute Office Visit  Subjective:     Patient ID: Kristy Garza, female    DOB: February 20, 1951, 71 y.o.   MRN: [id number]  Chief Complaint  Patient presents with  . Generalized Body Aches  . Headache  . Fatigue    Would like to be tested for lyme disease since she has pulled off multiple ticks   . trouble sleeping    Patient states it has been ongoing and she gets up evert 2 hours.     HPI Patient is in today for concern for lyme disease. She reports body aches, headaches, and fatigue for the last month. She has pulled off multiple ticks over the last few months. She also has not been sleeping well for the last few weeks. She has been trying to wean herself off of lorazepam and has made her sleep worse. She has been waking up every couple hours and it takes her awhile to fall back to sleep. She feels like she has small naps all night.   ROS      Objective:    BP 126/81   Pulse (!) 58   Temp 97.6 F (36.4 C) (Temporal)   Ht '5\' 7"'$  (1.702 m)   Wt 109 lb 12.8 oz (49.8 kg)   SpO2 99%   BMI 17.20 kg/m  {Vitals History (Optional):23777}  Physical Exam  No results found for any visits on 12/13/21.      Assessment & Plan:   Problem List Items Addressed This Visit   None   No orders of the defined types were placed in this encounter.   No follow-ups on file.  Gwenlyn Perking, FNP

## 2021-12-13 NOTE — Patient Instructions (Addendum)
Unisom (Doxylamine) over the counter for sleep as needed.

## 2021-12-14 ENCOUNTER — Encounter (HOSPITAL_COMMUNITY): Admission: RE | Admit: 2021-12-14 | Payer: Medicare HMO | Source: Ambulatory Visit

## 2021-12-14 LAB — ANEMIA PROFILE B
Basophils Absolute: 0 10*3/uL (ref 0.0–0.2)
Basos: 1 %
EOS (ABSOLUTE): 0.1 10*3/uL (ref 0.0–0.4)
Eos: 2 %
Ferritin: 90 ng/mL (ref 15–150)
Folate: >20 ng/mL (ref >3.0)
Hematocrit: 35.1 % (ref 34.0–46.6)
Hemoglobin: 11.6 g/dL (ref 11.1–15.9)
Immature Grans (Abs): 0 10*3/uL (ref 0.0–0.1)
Immature Granulocytes: 0 %
Iron Saturation: 32 % (ref 15–55)
Iron: 95 ug/dL (ref 27–139)
Lymphocytes Absolute: 0.7 10*3/uL (ref 0.7–3.1)
Lymphs: 12 %
MCH: 31 pg (ref 26.6–33.0)
MCHC: 33 g/dL (ref 31.5–35.7)
MCV: 94 fL (ref 79–97)
Monocytes Absolute: 0.5 10*3/uL (ref 0.1–0.9)
Monocytes: 9 %
Neutrophils Absolute: 4.2 10*3/uL (ref 1.4–7.0)
Neutrophils: 76 %
Platelets: 205 10*3/uL (ref 150–450)
RBC: 3.74 x10E6/uL — ABNORMAL LOW (ref 3.77–5.28)
RDW: 11.9 % (ref 11.7–15.4)
Retic Ct Pct: 1.3 % (ref 0.6–2.6)
Total Iron Binding Capacity: 297 ug/dL (ref 250–450)
UIBC: 202 ug/dL (ref 118–369)
Vitamin B-12: 539 pg/mL (ref 232–1245)
WBC: 5.4 10*3/uL (ref 3.4–10.8)

## 2021-12-14 LAB — BMP8+EGFR
BUN/Creatinine Ratio: 16 (ref 12–28)
BUN: 13 mg/dL (ref 8–27)
CO2: 22 mmol/L (ref 20–29)
Calcium: 9.8 mg/dL (ref 8.7–10.3)
Chloride: 98 mmol/L (ref 96–106)
Creatinine, Ser: 0.83 mg/dL (ref 0.57–1.00)
Glucose: 70 mg/dL (ref 70–99)
Potassium: 4.2 mmol/L (ref 3.5–5.2)
Sodium: 134 mmol/L (ref 134–144)
eGFR: 75 mL/min/{1.73_m2} (ref >59)

## 2021-12-14 LAB — LYME DISEASE SEROLOGY W/REFLEX: Lyme Total Antibody EIA: NEGATIVE

## 2021-12-17 ENCOUNTER — Ambulatory Visit: Payer: Medicare HMO

## 2021-12-21 DIAGNOSIS — N3010 Interstitial cystitis (chronic) without hematuria: Secondary | ICD-10-CM | POA: Diagnosis not present

## 2021-12-23 ENCOUNTER — Other Ambulatory Visit: Payer: Self-pay | Admitting: Allergy & Immunology

## 2021-12-23 DIAGNOSIS — M9902 Segmental and somatic dysfunction of thoracic region: Secondary | ICD-10-CM | POA: Diagnosis not present

## 2021-12-23 DIAGNOSIS — M9903 Segmental and somatic dysfunction of lumbar region: Secondary | ICD-10-CM | POA: Diagnosis not present

## 2021-12-23 DIAGNOSIS — M9901 Segmental and somatic dysfunction of cervical region: Secondary | ICD-10-CM | POA: Diagnosis not present

## 2021-12-23 DIAGNOSIS — M5137 Other intervertebral disc degeneration, lumbosacral region: Secondary | ICD-10-CM | POA: Diagnosis not present

## 2021-12-24 ENCOUNTER — Other Ambulatory Visit: Payer: Self-pay | Admitting: Allergy & Immunology

## 2021-12-27 ENCOUNTER — Encounter: Payer: Self-pay | Admitting: Family Medicine

## 2021-12-27 ENCOUNTER — Ambulatory Visit (INDEPENDENT_AMBULATORY_CARE_PROVIDER_SITE_OTHER): Payer: Medicare HMO | Admitting: Family Medicine

## 2021-12-27 VITALS — BP 104/66 | HR 84 | Temp 98.0°F | Ht 67.0 in | Wt 108.2 lb

## 2021-12-27 DIAGNOSIS — J4540 Moderate persistent asthma, uncomplicated: Secondary | ICD-10-CM | POA: Diagnosis not present

## 2021-12-27 DIAGNOSIS — J309 Allergic rhinitis, unspecified: Secondary | ICD-10-CM | POA: Diagnosis not present

## 2021-12-27 MED ORDER — ALBUTEROL SULFATE HFA 108 (90 BASE) MCG/ACT IN AERS: 2.0000 | INHALATION_SPRAY | Freq: Four times a day (QID) | RESPIRATORY_TRACT | 0 refills | Status: DC | PRN

## 2021-12-27 MED ORDER — METHYLPREDNISOLONE ACETATE 40 MG/ML IJ SUSP
40.0000 mg | Freq: Once | INTRAMUSCULAR | Status: AC
  Administered 2021-12-27: 40 mg via INTRAMUSCULAR

## 2021-12-27 NOTE — Progress Notes (Signed)
Acute Office Visit  Subjective:     Patient ID: Kristy Garza, female    DOB: Nov 10, 1950, 71 y.o.   MRN: [id number]  Chief Complaint  Patient presents with   Headache    HPI Patient is in today for headache and cough. She has a history of allergies in the fall. She has been taking allegra, astelin, and flonase daily. She reports an increase in dry cough and frontal headache for the last few days. She denies fever, chills, shortness of breath, wheezing, ear pain, or sore throat. She is interested in a steroid shot as this has been helpful in the past. She has not had a steroid shot in over 3 months.   She has a history of asthma. She uses Symbicort daily. She has not had to use her albuterol inhaler recently but would like a refill to have on hand as hers is currently expired.   ROS As per HPI.      Objective:    BP 104/66   Pulse 84   Temp 98 F (36.7 C) (Temporal)   Ht '5\' 7"'$  (1.702 m)   Wt 108 lb 4 oz (49.1 kg)   SpO2 98%   BMI 16.95 kg/m    Physical Exam Vitals and nursing note reviewed.  Constitutional:      General: She is not in acute distress.    Appearance: She is not ill-appearing, toxic-appearing or diaphoretic.  HENT:     Head: Normocephalic and atraumatic.     Nose:     Right Sinus: Maxillary sinus tenderness and frontal sinus tenderness present.     Left Sinus: Maxillary sinus tenderness and frontal sinus tenderness present.     Mouth/Throat:     Mouth: Mucous membranes are moist.     Pharynx: Oropharynx is clear.  Eyes:     General: No scleral icterus.    Extraocular Movements: Extraocular movements intact.  Cardiovascular:     Rate and Rhythm: Normal rate and regular rhythm.     Heart sounds: Normal heart sounds. No murmur heard. Pulmonary:     Effort: Pulmonary effort is normal. No respiratory distress.     Breath sounds: Normal breath sounds.  Abdominal:     General: Bowel sounds are normal. There is no distension.     Tenderness: There is  no abdominal tenderness. There is no guarding.  Musculoskeletal:     Cervical back: Neck supple.  Skin:    General: Skin is warm and dry.  Neurological:     Mental Status: She is alert and oriented to person, place, and time.  Psychiatric:        Mood and Affect: Mood normal.        Behavior: Behavior normal.     No results found for any visits on 12/27/21.      Assessment & Plan:   Kristy Garza was seen today for headache.  Diagnoses and all orders for this visit:  Allergic sinusitis Steroid IM injection today in office. Continue allergy regimen.  -     methylPREDNISolone acetate (DEPO-MEDROL) injection 40 mg  Moderate persistent asthma without complication Well controlled on current regimen. Continue symbicort. Albuterol prn. Keep follow up with asthma and allergy.  -     albuterol (VENTOLIN HFA) 108 (90 Base) MCG/ACT inhaler; Inhale 2 puffs into the lungs every 6 (six) hours as needed for wheezing or shortness of breath.  Return if symptoms worsen or fail to improve.  The patient indicates understanding of these  issues and agrees with the plan.  Gwenlyn Perking, FNP

## 2021-12-27 NOTE — Progress Notes (Deleted)
Follow Up Note  RE: Kristy Garza MRN: [id number] DOB: 02/12/1951 Date of Office Visit: 12/28/2021  Referring provider: [name], DO Primary care provider: [name], DO  Chief Complaint: No chief complaint on file.  History of Present Illness: I had the pleasure of seeing Kristy Garza for a follow up visit at the [facility] on 12/27/2021. She is a 71 y.o. female, who is being followed for nonallergic rhinitis, asthma, GERD and recurrent infections. Her previous allergy office visit was on 05/27/2021 with Dr. Maudie Mercury. Today is a regular follow up visit.  I reviewed the bloodwork. Blood count, immunoglobulin levels (antibodies made by the immune system), diptheria & tetanus titers (checks for how the immune system is functioning) were all normal which is great. Your s. Pneumoniae titers were protective which is great. No immunodeficiency issues based on these results.    Keep track of infections/antibiotics use.  Nonallergic rhinitis Past history - 2022 bloodwork negative to environmental allergy panel. ENT evaluation unremarkable (patient declined laryngoscopy) Interim history - still having symptoms and wants to get allergy skin testing done today.  Today's skin testing showed: Negative to indoor/outdoor allergies and common foods.  Discussed with patient that she can have these symptoms and not have environmental allergies.  Use Nasacort (triamcinolone) or Nasonex nasal spray 1 spray per nostril twice a day as needed for nasal congestion.  Use Atrovent (ipratropium) 0.63% 1-2 sprays per nostril three a day as needed for runny nose/drainage. Nasal saline spray (i.e., Simply Saline) or nasal saline lavage (i.e., NeilMed) is recommended as needed and prior to medicated nasal sprays.   Moderate persistent asthma without complication Past history - Breo caused thrush. 2022 spirometry showed: normal pattern with 7% improvement in FEV1 post  bronchodilator treatment. Clinically feeling much better. Covid-19 in Nov 2022. Interim history - doing better with Symbicort but can only tolerate 1 puff BID. No additional prednisone since the last visit. Some coughing in the mornings and uses albuterol twice per week with good benefit. No recent neb use. Today's spirometry was normal.  Daily controller medication(s): Symbicort 24mg 1 puff twice a day with spacer and rinse mouth afterwards. During upper respiratory infections/asthma flares:  Start budesonide 0.'5mg'$  nebulizer twice a day for 1-2 weeks until your breathing symptoms return to baseline.  Pretreat with albuterol 2 puffs or albuterol nebulizer.  If you need to use your albuterol nebulizer machine back to back within 15-30 minutes with no relief then please go to the ER/urgent care for further evaluation.  May use albuterol rescue inhaler 2 puffs or nebulizer every 4 to 6 hours as needed for shortness of breath, chest tightness, coughing, and wheezing. May use albuterol rescue inhaler 2 puffs 5 to 15 minutes prior to strenuous physical activities. Monitor frequency of use.  Get spirometry at next visit.   Gastroesophageal reflux disease Stopped PPI with no worsening symptoms.  Continue dietary and lifestyle modifications as listed below Monitor symptoms.    Recurrent infections Interim history - bloodwork (Immunoglobulin levels, pneumococcal titer, tetanus/diptheria titer) normal. Keep track of infections and antibiotics use.   Return in about 3 months (around 08/27/2021). Advised patient to bring a copy of her allergy bloodwork that she had done at an integrative medicine clinic.   Assessment and Plan: [name] a 71y.o. female with: No problem-specific Assessment & Plan notes found for this encounter.  No follow-ups on file.  No orders of the defined types were placed in this  encounter.  Lab Orders  No laboratory test(s) ordered today    Diagnostics: Spirometry:   Tracings reviewed. Her effort: {Blank single:19197::"Good reproducible efforts.","It was hard to get consistent efforts and there is a question as to whether this reflects a maximal maneuver.","Poor effort, data can not be interpreted."} FVC: ***L FEV1: ***L, ***% predicted FEV1/FVC ratio: ***% Interpretation: {Blank single:19197::"Spirometry consistent with mild obstructive disease","Spirometry consistent with moderate obstructive disease","Spirometry consistent with severe obstructive disease","Spirometry consistent with possible restrictive disease","Spirometry consistent with mixed obstructive and restrictive disease","Spirometry uninterpretable due to technique","Spirometry consistent with normal pattern","No overt abnormalities noted given today's efforts"}.  Please see scanned spirometry results for details.  Skin Testing: {Blank single:19197::"Select foods","Environmental allergy panel","Environmental allergy panel and select foods","Food allergy panel","None","Deferred due to recent antihistamines use"}. *** Results discussed with patient/family.   Medication List:  Current Outpatient Medications  Medication Sig Dispense Refill   acetaminophen (TYLENOL) 500 MG tablet Take by mouth.     albuterol (VENTOLIN HFA) 108 (90 Base) MCG/ACT inhaler USE 2 PUFFS EVERY 4 HOURS AS NEEDED FOR COUGH, WHEEZE, TIGHTNESS IN CHEST, OR SHORTNESS OF BREATH 18 each 1   ascorbic acid (VITAMIN C) 1000 MG tablet Take by mouth.     aspirin-acetaminophen-caffeine (EXCEDRIN MIGRAINE) 250-250-65 MG tablet Take by mouth.     azelastine (ASTELIN) 0.1 % nasal spray Place into both nostrils 2 (two) times daily. Use in each nostril as directed     b complex vitamins capsule Take 1 capsule by mouth daily.     budesonide-formoterol (SYMBICORT) 80-4.5 MCG/ACT inhaler Inhale 2 puffs into the lungs in the morning and at bedtime. with spacer and rinse mouth afterwards. 1 each 5   calcium citrate-vitamin D (CITRACAL+D)  315-200 MG-UNIT tablet Take by mouth.     cetirizine (ZYRTEC) 10 MG tablet Take 1 tablet (10 mg total) by mouth daily. (Patient not taking: Reported on 12/13/2021) 30 tablet 11   Cholecalciferol (VITAMIN D3) 125 MCG (5000 UT) CAPS Take 5,000 Units by mouth daily.     cycloSPORINE (RESTASIS) 0.05 % ophthalmic emulsion      Dermatological Products, Misc. (SUVICORT) EMUL      fluticasone (FLONASE) 50 MCG/ACT nasal spray Place 2 sprays into both nostrils daily. (Patient not taking: Reported on 12/13/2021) 16 g 6   hydrOXYzine (ATARAX) 25 MG tablet Take 25 mg by mouth at bedtime.     hyoscyamine (LEVSIN SL) 0.125 MG SL tablet Place under the tongue every 4 (four) hours as needed.     ipratropium (ATROVENT) 0.06 % nasal spray Place 1-2 sprays into both nostrils 3 (three) times daily as needed (drainage). 15 mL 5   lidocaine (XYLOCAINE) 2 % jelly APPLY TOPICALLY TO AFFECTED AREA EVERY DAY AS NEEDED (use sparingly) 85 g 0   Lidocaine-Collagen-Aloe Vera (REGENECARE) 2 % GEL Apply topically daily. 85 g 2   LORazepam (ATIVAN) 0.5 MG tablet Take 1 tablet (0.5 mg total) by mouth 2 (two) times daily as needed for anxiety. 30 tablet 2   magnesium oxide (MAG-OX) 400 MG tablet Take by mouth.     melatonin 5 MG TABS Take 5 mg by mouth.     mirtazapine (REMERON) 7.5 MG tablet Take 7.5 mg by mouth at bedtime.     mometasone (NASONEX) 50 MCG/ACT nasal spray Place 1 spray into the nose 2 (two) times daily as needed (nasal congestion). 1 each 5   Plecanatide (TRULANCE) 3 MG TABS Take 1 tablet by mouth daily. For constipation 30 tablet 12   Polyethylene Glycol 3350 (  MIRALAX PO) Take by mouth as needed.     Probiotic Product (PROBIOTIC PO) Take by mouth daily.     Simethicone (GAS-X PO) Take by mouth.     tizanidine (ZANAFLEX) 2 MG capsule Take 1-2 capsules (2-4 mg total) by mouth 3 (three) times daily as needed for muscle spasms. 60 capsule 2   traMADol (ULTRAM) 50 MG tablet Take 1 tablet (50 mg total) by mouth every 12  (twelve) hours as needed. 30 tablet 1   UNABLE TO FIND Take 1 capsule by mouth daily. Dv3- vitamin D 3 plus immune support     Current Facility-Administered Medications  Medication Dose Route Frequency Provider Last Rate Last Admin   zoledronic acid (RECLAST) injection 5 mg  5 mg Intravenous Once Ronnie Doss M, DO       Allergies: Allergies  Allergen Reactions   Cefuroxime Axetil Other (See Comments)    Extreme gas   Gabapentin Other (See Comments)    Constipation Constipation   Montelukast Other (See Comments)    Numbness and tingling in hand. Numbness and tingling in hand. Numbness and tingling in hand. NUMBNESS OF HANDS AND FEET. Numbness in hands and feet NUMBNESS OF HANDS AND FEET.   Pregabalin Other (See Comments)    Drowsiness, dry mouth Drowsiness, dry mouth Makes her tired and thirsty   Augmentin [Amoxicillin-Pot Clavulanate] Diarrhea   Avelox [Moxifloxacin Hcl In Nacl] Other (See Comments)    Tremors    Biaxin [Clarithromycin]     Unsure of reaction   Cedax [Ceftibuten] Other (See Comments)    tremors   Ciprofloxacin Other (See Comments) and Nausea And Vomiting    Blurred vision Blurred vision Pt can't remember reaction   Clindamycin/Lincomycin     tachycardia   Levofloxacin Other (See Comments)    Feels dehydrated   Lincomycin Other (See Comments)    tachycardia   Montelukast Sodium Other (See Comments)    Numbness and tingling in hand. Numbness and tingling in hand.   Sulfonamide Derivatives Other (See Comments)    Gi upset   Sulfa Antibiotics Nausea Only    Other reaction(s): Other (See Comments) Gi upset  Other reaction(s): Other (See Comments) Gi upset Other reaction(s): Unknown   I reviewed her past medical history, social history, family history, and environmental history and no significant changes have been reported from her previous visit.  Review of Systems  Constitutional:  Negative for appetite change, chills, fever and  unexpected weight change.  HENT:  Positive for postnasal drip and rhinorrhea. Negative for congestion.   Eyes:  Negative for itching.  Respiratory:  Positive for cough. Negative for chest tightness, shortness of breath and wheezing.   Gastrointestinal:  Negative for abdominal pain.  Skin:  Negative for rash.  Allergic/Immunologic: Negative for environmental allergies and food allergies.  Neurological:  Positive for headaches.    Objective: There were no vitals taken for this visit. There is no height or weight on file to calculate BMI. Physical Exam Vitals and nursing note reviewed.  Constitutional:      Appearance: Normal appearance. She is well-developed.  HENT:     Head: Normocephalic and atraumatic.     Right Ear: External ear normal.     Left Ear: External ear normal.     Nose: Nose normal.     Mouth/Throat:     Mouth: Mucous membranes are moist.     Pharynx: Oropharynx is clear.  Eyes:     Conjunctiva/sclera: Conjunctivae normal.  Cardiovascular:  Rate and Rhythm: Normal rate and regular rhythm.     Heart sounds: Normal heart sounds. No murmur heard. Pulmonary:     Effort: Pulmonary effort is normal.     Breath sounds: Normal breath sounds. No wheezing, rhonchi or rales.  Musculoskeletal:     Cervical back: Neck supple.  Skin:    General: Skin is warm.     Findings: No rash.  Neurological:     Mental Status: She is alert and oriented to person, place, and time.  Psychiatric:        Behavior: Behavior normal.    Previous notes and tests were reviewed. The plan was reviewed with the patient/family, and all questions/concerned were addressed.  It was my pleasure to see Kristy Garza today and participate in her care. Please feel free to contact me with any questions or concerns.  Sincerely,  Rexene Alberts, DO Allergy & Immunology  [facility] office: [place] office: [phone]

## 2021-12-27 NOTE — Patient Instructions (Signed)
Allergic Rhinitis, Adult  Allergic rhinitis is an allergic reaction that affects the mucous membrane inside the nose. The mucous membrane is the tissue that produces mucus. There are two types of allergic rhinitis: Seasonal. This type is also called hay fever and happens only during certain seasons. Perennial. This type can happen at any time of the year. Allergic rhinitis cannot be spread from person to person. This condition can be mild, moderate, or severe. It can develop at any age and may be outgrown. What are the causes? This condition is caused by allergens. These are things that can cause an allergic reaction. Allergens may differ for seasonal allergic rhinitis and perennial allergic rhinitis. Seasonal allergic rhinitis is triggered by pollen. Pollen can come from grasses, trees, and weeds. Perennial allergic rhinitis may be triggered by: Dust mites. Proteins in a pet's urine, saliva, or dander. Dander is dead skin cells from a pet. Smoke, mold, or car fumes. What increases the risk? You are more likely to develop this condition if you have a family history of allergies or other conditions related to allergies, including: Allergic conjunctivitis. This is inflammation of parts of the eyes and eyelids. Asthma. This condition affects the lungs and makes it hard to breathe. Atopic dermatitis or eczema. This is long term (chronic) inflammation of the skin. Food allergies. What are the signs or symptoms? Symptoms of this condition include: Sneezing or coughing. A stuffy nose (nasal congestion), itchy nose, or nasal discharge. Itchy eyes and tearing of the eyes. A feeling of mucus dripping down the back of your throat (postnasal drip). Trouble sleeping. Tiredness or fatigue. Headache. Sore throat. How is this diagnosed? This condition may be diagnosed with your symptoms, medical history, and physical exam. Your health care provider may check for related conditions, such  as: Asthma. Pink eye. This is eye inflammation caused by infection (conjunctivitis). Ear infection. Upper respiratory infection. This is an infection in the nose, throat, or upper airways. You may also have tests to find out which allergens trigger your symptoms. These may include skin tests or blood tests. How is this treated? There is no cure for this condition, but treatment can help control symptoms. Treatment may include: Taking medicines that block allergy symptoms, such as corticosteroids and antihistamines. Medicine may be given as a shot, nasal spray, or pill. Avoiding any allergens. Being exposed again and again to tiny amounts of allergens to help you build a defense against allergens (immunotherapy). This is done if other treatments have not helped. It may include: Allergy shots. These are injected medicines that have small amounts of allergen in them. Sublingual immunotherapy. This involves taking small doses of a medicine with allergen in it under your tongue. If these treatments do not work, your health care provider may prescribe newer, stronger medicines. Follow these instructions at home: Avoiding allergens Find out what you are allergic to and avoid those allergens. These are some things you can do to help avoid allergens: If you have perennial allergies: Replace carpet with wood, tile, or vinyl flooring. Carpet can trap dander and dust. Do not smoke. Do not allow smoking in your home. Change your heating and air conditioning filters at least once a month. If you have seasonal allergies, take these steps during allergy season: Keep windows closed as much as possible. Plan outdoor activities when pollen counts are lowest. Check pollen counts before you plan outdoor activities. When coming indoors, change clothing and shower before sitting on furniture or bedding. If you have a pet   in the house that produces allergens: Keep the pet out of the bedroom. Vacuum, sweep, and  dust regularly. General instructions Take over-the-counter and prescription medicines only as told by your health care provider. Drink enough fluid to keep your urine pale yellow. Keep all follow-up visits as told by your health care provider. This is important. Where to find more information American Academy of Allergy, Asthma & Immunology: www.aaaai.org Contact a health care provider if: You have a fever. You develop a cough that does not go away. You make whistling sounds when you breathe (wheeze). Your symptoms slow you down or stop you from doing your normal activities each day. Get help right away if: You have shortness of breath. This symptom may represent a serious problem that is an emergency. Do not wait to see if the symptom will go away. Get medical help right away. Call your local emergency services (911 in the [place] Do not drive yourself to the hospital. Summary Allergic rhinitis may be managed by taking medicines as directed and avoiding allergens. If you have seasonal allergies, keep windows closed as much as possible during allergy season. Contact your health care provider if you develop a fever or a cough that does not go away. This information is not intended to replace advice given to you by your health care provider. Make sure you discuss any questions you have with your health care provider. Document Revised: 05/03/2019 Document Reviewed: 03/12/2019 Elsevier Patient Education  [employer].

## 2021-12-28 ENCOUNTER — Ambulatory Visit: Payer: Medicare HMO | Admitting: Allergy

## 2021-12-30 ENCOUNTER — Telehealth: Payer: Self-pay | Admitting: Family Medicine

## 2021-12-30 NOTE — Telephone Encounter (Signed)
I recommend following up with her allergist. She should follow up with her PCP for insomnia as this has been a chronic issue for her.

## 2022-01-02 NOTE — Progress Notes (Unsigned)
Follow Up Note  RE: Kristy Garza MRN: [id number] DOB: 07-11-1950 Date of Office Visit: 01/03/2022  Referring provider: [name], DO Primary care provider: [name], DO  Chief Complaint: No chief complaint on file.  History of Present Illness: I had the pleasure of seeing [name] for a follow up visit at the [facility] on 01/02/2022. She is a 71 y.o. female, who is being followed for chronic nonallergic rhinitis, asthma, GERD, recurrent infections. Her previous allergy office visit was on 05/27/2021 with Dr. Maudie Mercury. Today is a regular follow up visit.  Nonallergic rhinitis Past history - 2022 bloodwork negative to environmental allergy panel. ENT evaluation unremarkable (patient declined laryngoscopy) Interim history - still having symptoms and wants to get allergy skin testing done today.  Today's skin testing showed: Negative to indoor/outdoor allergies and common foods.  Discussed with patient that she can have these symptoms and not have environmental allergies.  Use Nasacort (triamcinolone) or Nasonex nasal spray 1 spray per nostril twice a day as needed for nasal congestion.  Use Atrovent (ipratropium) 0.63% 1-2 sprays per nostril three a day as needed for runny nose/drainage. Nasal saline spray (i.e., Simply Saline) or nasal saline lavage (i.e., NeilMed) is recommended as needed and prior to medicated nasal sprays.   Moderate persistent asthma without complication Past history - Breo caused thrush. 2022 spirometry showed: normal pattern with 7% improvement in FEV1 post bronchodilator treatment. Clinically feeling much better. Covid-19 in Nov 2022. Interim history - doing better with Symbicort but can only tolerate 1 puff BID. No additional prednisone since the last visit. Some coughing in the mornings and uses albuterol twice per week with good benefit. No recent neb use. Today's spirometry was normal.  Daily controller medication(s):  Symbicort 33mg 1 puff twice a day with spacer and rinse mouth afterwards. During upper respiratory infections/asthma flares:  Start budesonide 0.'5mg'$  nebulizer twice a day for 1-2 weeks until your breathing symptoms return to baseline.  Pretreat with albuterol 2 puffs or albuterol nebulizer.  If you need to use your albuterol nebulizer machine back to back within 15-30 minutes with no relief then please go to the ER/urgent care for further evaluation.  May use albuterol rescue inhaler 2 puffs or nebulizer every 4 to 6 hours as needed for shortness of breath, chest tightness, coughing, and wheezing. May use albuterol rescue inhaler 2 puffs 5 to 15 minutes prior to strenuous physical activities. Monitor frequency of use.  Get spirometry at next visit.   Gastroesophageal reflux disease Stopped PPI with no worsening symptoms.  Continue dietary and lifestyle modifications as listed below Monitor symptoms.    Recurrent infections Interim history - bloodwork (Immunoglobulin levels, pneumococcal titer, tetanus/diptheria titer) normal. Keep track of infections and antibiotics use.   Return in about 3 months (around 08/27/2021). Advised patient to bring a copy of her allergy bloodwork that she had done at an integrative medicine clinic.   12/27/2021 PCP visit: "Patient is in today for headache and cough. She has a history of allergies in the fall. She has been taking allegra, astelin, and flonase daily. She reports an increase in dry cough and frontal headache for the last few days. She denies fever, chills, shortness of breath, wheezing, ear pain, or sore throat. She is interested in a steroid shot as this has been helpful in the past. She has not had a steroid shot in over 3 months.    She has a history of asthma. She  uses Symbicort daily. She has not had to use her albuterol inhaler recently but would like a refill to have on hand as hers is currently expired."  Assessment and Plan: Kristy Garza is a 71  y.o. female with: No problem-specific Assessment & Plan notes found for this encounter.  No follow-ups on file.  No orders of the defined types were placed in this encounter.  Lab Orders  No laboratory test(s) ordered today    Diagnostics: Spirometry:  Tracings reviewed. Her effort: {Blank single:19197::"Good reproducible efforts.","It was hard to get consistent efforts and there is a question as to whether this reflects a maximal maneuver.","Poor effort, data can not be interpreted."} FVC: ***L FEV1: ***L, ***% predicted FEV1/FVC ratio: ***% Interpretation: {Blank single:19197::"Spirometry consistent with mild obstructive disease","Spirometry consistent with moderate obstructive disease","Spirometry consistent with severe obstructive disease","Spirometry consistent with possible restrictive disease","Spirometry consistent with mixed obstructive and restrictive disease","Spirometry uninterpretable due to technique","Spirometry consistent with normal pattern","No overt abnormalities noted given today's efforts"}.  Please see scanned spirometry results for details.  Skin Testing: {Blank single:19197::"Select foods","Environmental allergy panel","Environmental allergy panel and select foods","Food allergy panel","None","Deferred due to recent antihistamines use"}. *** Results discussed with patient/family.   Medication List:  Current Outpatient Medications  Medication Sig Dispense Refill   acetaminophen (TYLENOL) 500 MG tablet Take by mouth.     albuterol (VENTOLIN HFA) 108 (90 Base) MCG/ACT inhaler Inhale 2 puffs into the lungs every 6 (six) hours as needed for wheezing or shortness of breath. 18 each 0   amitriptyline (ELAVIL) 10 MG tablet Take 10 mg by mouth at bedtime.     ascorbic acid (VITAMIN C) 1000 MG tablet Take by mouth.     aspirin-acetaminophen-caffeine (EXCEDRIN MIGRAINE) 250-250-65 MG tablet Take by mouth.     azelastine (ASTELIN) 0.1 % nasal spray Place into both  nostrils 2 (two) times daily. Use in each nostril as directed     b complex vitamins capsule Take 1 capsule by mouth daily.     budesonide-formoterol (SYMBICORT) 80-4.5 MCG/ACT inhaler Inhale 2 puffs into the lungs in the morning and at bedtime. with spacer and rinse mouth afterwards. 1 each 5   calcium citrate-vitamin D (CITRACAL+D) 315-200 MG-UNIT tablet Take by mouth.     cetirizine (ZYRTEC) 10 MG tablet Take 1 tablet (10 mg total) by mouth daily. 30 tablet 11   Cholecalciferol (VITAMIN D3) 125 MCG (5000 UT) CAPS Take 5,000 Units by mouth daily.     cycloSPORINE (RESTASIS) 0.05 % ophthalmic emulsion      Dermatological Products, Misc. (SUVICORT) EMUL      fluticasone (FLONASE) 50 MCG/ACT nasal spray Place 2 sprays into both nostrils daily. 16 g 6   hydrOXYzine (ATARAX) 25 MG tablet Take 25 mg by mouth at bedtime.     hyoscyamine (LEVSIN SL) 0.125 MG SL tablet Place under the tongue every 4 (four) hours as needed.     ipratropium (ATROVENT) 0.06 % nasal spray Place 1-2 sprays into both nostrils 3 (three) times daily as needed (drainage). 15 mL 5   lidocaine (XYLOCAINE) 2 % jelly APPLY TOPICALLY TO AFFECTED AREA EVERY DAY AS NEEDED (use sparingly) 85 g 0   Lidocaine-Collagen-Aloe Vera (REGENECARE) 2 % GEL Apply topically daily. 85 g 2   LORazepam (ATIVAN) 0.5 MG tablet Take 1 tablet (0.5 mg total) by mouth 2 (two) times daily as needed for anxiety. 30 tablet 2   magnesium oxide (MAG-OX) 400 MG tablet Take by mouth.     melatonin 5 MG TABS Take 5 mg  by mouth.     Meth-Hyo-M Bl-Na Phos-Ph Sal (URO-MP) 118 MG CAPS Take 1 capsule by mouth 4 (four) times daily as needed. (Patient not taking: Reported on 12/27/2021)     mirtazapine (REMERON) 7.5 MG tablet Take 7.5 mg by mouth at bedtime.     mometasone (NASONEX) 50 MCG/ACT nasal spray Place 1 spray into the nose 2 (two) times daily as needed (nasal congestion). 1 each 5   Plecanatide (TRULANCE) 3 MG TABS Take 1 tablet by mouth daily. For constipation  30 tablet 12   Polyethylene Glycol 3350 (MIRALAX PO) Take by mouth as needed.     Probiotic Product (PROBIOTIC PO) Take by mouth daily.     Simethicone (GAS-X PO) Take by mouth.     tizanidine (ZANAFLEX) 2 MG capsule Take 1-2 capsules (2-4 mg total) by mouth 3 (three) times daily as needed for muscle spasms. 60 capsule 2   traMADol (ULTRAM) 50 MG tablet Take 1 tablet (50 mg total) by mouth every 12 (twelve) hours as needed. 30 tablet 1   UNABLE TO FIND Take 1 capsule by mouth daily. Dv3- vitamin D 3 plus immune support     Current Facility-Administered Medications  Medication Dose Route Frequency Provider Last Rate Last Admin   zoledronic acid (RECLAST) injection 5 mg  5 mg Intravenous Once Ronnie Doss M, DO       Allergies: Allergies  Allergen Reactions   Cefuroxime Axetil Other (See Comments)    Extreme gas   Gabapentin Other (See Comments)    Constipation Constipation   Montelukast Other (See Comments)    Numbness and tingling in hand. Numbness and tingling in hand. Numbness and tingling in hand. NUMBNESS OF HANDS AND FEET. Numbness in hands and feet NUMBNESS OF HANDS AND FEET.   Pregabalin Other (See Comments)    Drowsiness, dry mouth Drowsiness, dry mouth Makes her tired and thirsty   Augmentin [Amoxicillin-Pot Clavulanate] Diarrhea   Avelox [Moxifloxacin Hcl In Nacl] Other (See Comments)    Tremors    Biaxin [Clarithromycin]     Unsure of reaction   Cedax [Ceftibuten] Other (See Comments)    tremors   Ciprofloxacin Other (See Comments) and Nausea And Vomiting    Blurred vision Blurred vision Pt can't remember reaction   Clindamycin/Lincomycin     tachycardia   Levofloxacin Other (See Comments)    Feels dehydrated   Lincomycin Other (See Comments)    tachycardia   Montelukast Sodium Other (See Comments)    Numbness and tingling in hand. Numbness and tingling in hand.   Sulfonamide Derivatives Other (See Comments)    Gi upset   Sulfa Antibiotics Nausea  Only    Other reaction(s): Other (See Comments) Gi upset  Other reaction(s): Other (See Comments) Gi upset Other reaction(s): Unknown   I reviewed her past medical history, social history, family history, and environmental history and no significant changes have been reported from her previous visit.  Review of Systems  Constitutional:  Negative for appetite change, chills, fever and unexpected weight change.  HENT:  Positive for postnasal drip and rhinorrhea. Negative for congestion.   Eyes:  Negative for itching.  Respiratory:  Positive for cough. Negative for chest tightness, shortness of breath and wheezing.   Gastrointestinal:  Negative for abdominal pain.  Skin:  Negative for rash.  Allergic/Immunologic: Negative for environmental allergies and food allergies.  Neurological:  Positive for headaches.    Objective: There were no vitals taken for this visit. There is no height  or weight on file to calculate BMI. Physical Exam Vitals and nursing note reviewed.  Constitutional:      Appearance: Normal appearance. She is well-developed.  HENT:     Head: Normocephalic and atraumatic.     Right Ear: External ear normal.     Left Ear: External ear normal.     Nose: Nose normal.     Mouth/Throat:     Mouth: Mucous membranes are moist.     Pharynx: Oropharynx is clear.  Eyes:     Conjunctiva/sclera: Conjunctivae normal.  Cardiovascular:     Rate and Rhythm: Normal rate and regular rhythm.     Heart sounds: Normal heart sounds. No murmur heard. Pulmonary:     Effort: Pulmonary effort is normal.     Breath sounds: Normal breath sounds. No wheezing, rhonchi or rales.  Musculoskeletal:     Cervical back: Neck supple.  Skin:    General: Skin is warm.     Findings: No rash.  Neurological:     Mental Status: She is alert and oriented to person, place, and time.  Psychiatric:        Behavior: Behavior normal.    Previous notes and tests were reviewed. The plan was reviewed  with the patient/family, and all questions/concerned were addressed.  It was my pleasure to see Kaela today and participate in her care. Please feel free to contact me with any questions or concerns.  Sincerely,  Rexene Alberts, DO Allergy & Immunology  [facility] office: [place] office: [phone]

## 2022-01-03 ENCOUNTER — Encounter: Payer: Self-pay | Admitting: Allergy

## 2022-01-03 ENCOUNTER — Other Ambulatory Visit: Payer: Self-pay

## 2022-01-03 ENCOUNTER — Ambulatory Visit: Payer: Medicare HMO | Admitting: Allergy

## 2022-01-03 VITALS — BP 116/80 | HR 97 | Temp 98.8°F | Resp 16 | Wt 110.2 lb

## 2022-01-03 DIAGNOSIS — J310 Chronic rhinitis: Secondary | ICD-10-CM

## 2022-01-03 DIAGNOSIS — J4540 Moderate persistent asthma, uncomplicated: Secondary | ICD-10-CM | POA: Diagnosis not present

## 2022-01-03 DIAGNOSIS — J019 Acute sinusitis, unspecified: Secondary | ICD-10-CM | POA: Diagnosis not present

## 2022-01-03 DIAGNOSIS — K219 Gastro-esophageal reflux disease without esophagitis: Secondary | ICD-10-CM

## 2022-01-03 DIAGNOSIS — B999 Unspecified infectious disease: Secondary | ICD-10-CM

## 2022-01-03 MED ORDER — RYALTRIS 665-25 MCG/ACT NA SUSP: 1.0000 | Freq: Two times a day (BID) | NASAL | 5 refills | Status: DC

## 2022-01-03 MED ORDER — DOXYCYCLINE MONOHYDRATE 100 MG PO TABS: 100.0000 mg | ORAL_TABLET | Freq: Two times a day (BID) | ORAL | 0 refills | Status: DC

## 2022-01-03 NOTE — Assessment & Plan Note (Signed)
Past history - Breo caused thrush. 2022 spirometry showed: normal pattern with 7% improvement in FEV1 post bronchodilator treatment. Clinically feeling much better. Covid-19 in Nov 2022. Interim history - switched to Arnuity as Symbicort causes some shaking. Recently using albuterol once a day.  . Daily controller medication(s): continue Arnuity 259mg 1 puff once a day and rinse mouth after each use.  o If your asthma is not doing well then let uKoreaknow.  . During upper respiratory infections/asthma flares:  o Start budesonide 0.'5mg'$  nebulizer twice a day for 1-2 weeks until your breathing symptoms return to baseline.  o Pretreat with albuterol 2 puffs or albuterol nebulizer.  o If you need to use your albuterol nebulizer machine back to back within 15-30 minutes with no relief then please go to the ER/urgent care for further evaluation.  . May use albuterol rescue inhaler 2 puffs or nebulizer every 4 to 6 hours as needed for shortness of breath, chest tightness, coughing, and wheezing. May use albuterol rescue inhaler 2 puffs 5 to 15 minutes prior to strenuous physical activities. Monitor frequency of use.   Get spirometry at next visit.

## 2022-01-03 NOTE — Assessment & Plan Note (Signed)
Interim history - bloodwork (Immunoglobulin levels, pneumococcal titer, tetanus/diptheria titer) normal. . Keep track of infections and antibiotics use.

## 2022-01-03 NOTE — Assessment & Plan Note (Signed)
Persistent headaches, PND, coughing, teeth pain x 1 week. No benefit with depo. History of TMJ. No recent antibiotics.  Possible sinus infection. . Take doxycycline '100mg'$  twice a day for 10 days. . If you still having teeth pain recommend dental evaluation next.

## 2022-01-03 NOTE — Assessment & Plan Note (Signed)
Past history - 2022 bloodwork negative to environmental allergy panel. ENT evaluation unremarkable (patient declined laryngoscopy). 2023 skin testing negative.  Interim history - still having sinus issues.  Discussed with patient that she can have these symptoms and not have environmental allergies.  Start Ryaltris (olopatadine + mometasone nasal spray combination) 1-2 sprays per nostril twice a day. Sample given. This replaces your other nasal sprays. If this works well for you, then have [facility] ship the medication to your home - prescription already sent in.   Nasal saline spray (i.e., Simply Saline) or nasal saline lavage (i.e., NeilMed) is recommended as needed and prior to medicated nasal sprays.

## 2022-01-03 NOTE — Patient Instructions (Addendum)
Possible sinus infection Take doxycycline '100mg'$  twice a day for 10 days If you still having teeth pain recommend that you see your dentist next.   Rhinitis Start Ryaltris (olopatadine + mometasone nasal spray combination) 1-2 sprays per nostril twice a day. Sample given. This replaces your other nasal sprays. If this works well for you, then have [facility] ship the medication to your home - prescription already sent in.  Nasal saline spray (i.e., Simply Saline) or nasal saline lavage (i.e., NeilMed) is recommended as needed and prior to medicated nasal sprays.  Asthma Daily controller medication(s): continue Arnuity 214mg 1 puff once a day and rinse mouth after each use.  If your asthma is not doing well then let uKoreaknow.  During upper respiratory infections/asthma flares:  Start budesonide 0.'5mg'$  nebulizer twice a day for 1-2 weeks until your breathing symptoms return to baseline.  Pretreat with albuterol 2 puffs or albuterol nebulizer.  If you need to use your albuterol nebulizer machine back to back within 15-30 minutes with no relief then please go to the ER/urgent care for further evaluation.  May use albuterol rescue inhaler 2 puffs or nebulizer every 4 to 6 hours as needed for shortness of breath, chest tightness, coughing, and wheezing. May use albuterol rescue inhaler 2 puffs 5 to 15 minutes prior to strenuous physical activities. Monitor frequency of use.  Asthma control goals:  Full participation in all desired activities (may need albuterol before activity) Albuterol use two times or less a week on average (not counting use with activity) Cough interfering with sleep two times or less a month Oral steroids no more than once a year No hospitalizations   Reflux Continue dietary and lifestyle modifications as below. Monitor symptoms.   Follow up in 2 months or sooner if needed.   Get flu shot in the fall when you are feeling better.   Please bring a copy of your bloodwork to  your visit - the one that was done at the integrative medicine.

## 2022-01-04 ENCOUNTER — Ambulatory Visit (INDEPENDENT_AMBULATORY_CARE_PROVIDER_SITE_OTHER): Payer: Medicare HMO | Admitting: Nurse Practitioner

## 2022-01-04 ENCOUNTER — Encounter: Payer: Self-pay | Admitting: Nurse Practitioner

## 2022-01-04 VITALS — BP 107/69 | HR 83 | Temp 97.6°F | Ht 67.0 in | Wt 108.6 lb

## 2022-01-04 DIAGNOSIS — R202 Paresthesia of skin: Secondary | ICD-10-CM

## 2022-01-04 NOTE — Patient Instructions (Signed)
Paresthesia Paresthesia is a burning or prickling feeling. This feeling can happen in any part of the body. It often happens in the hands, arms, legs, or feet. Usually, it is not painful. In most cases, the feeling goes away in a short time and is not a sign of a serious problem. If you have paresthesia that lasts a long time, you need to see your doctor. Follow these instructions at home: Nutrition Eat a healthy diet. This includes: Eating foods that are high in fiber. These include beans, whole grains, and fresh fruits and vegetables. Limiting foods that are high in fat and sugar. These include fried or sweet foods.  Alcohol use  Do not drink alcohol if: Your doctor tells you not to drink. You are pregnant, may be pregnant, or are planning to become pregnant. If you drink alcohol: Limit how much you have to: 0-1 drink a day for women. 0-2 drinks a day for men. Know how much alcohol is in your drink. In the [place] one drink equals one 12 oz bottle of beer (355 mL), one 5 oz glass of wine (148 mL), or one 1 oz glass of hard liquor (44 mL). General instructions Take over-the-counter and prescription medicines only as told by your doctor. Do not smoke or use any products that contain nicotine or tobacco. If you need help quitting, ask your doctor. If you have diabetes, work with your doctor to make sure your blood sugar stays in a healthy range. If your feet feel numb: Check for redness, warmth, and swelling every day. Wear padded socks and comfortable shoes. These help protect your feet. Keep all follow-up visits. Contact a doctor if: You have paresthesia that gets worse or does not go away. You lose feeling (have numbness) after an injury. Your burning or prickling feeling gets worse when you walk. You have pain or cramps. You feel dizzy or you faint. You have a rash. Get help right away if: You feel weak or have new weakness in an arm or leg. You have trouble walking or  moving. You have problems speaking, understanding, or seeing. You feel confused. You cannot control when you pee (urinate) or poop (have a bowel movement). These symptoms may be an emergency. Get help right away. Call 911. Do not wait to see if the symptoms will go away. Do not drive yourself to the hospital. Summary Paresthesia is a burning or prickling feeling. It often happens in the hands, arms, legs, or feet. In most cases, the feeling goes away in a short time and is not a sign of a serious problem. If you have paresthesia that lasts a long time, you need to be seen by your doctor. This information is not intended to replace advice given to you by your health care provider. Make sure you discuss any questions you have with your health care provider. Document Revised: 11/23/2020 Document Reviewed: 11/23/2020 Elsevier Patient Education  2023 Elsevier Inc.  

## 2022-01-04 NOTE — Progress Notes (Signed)
   Acute Office Visit  Subjective:     Patient ID: Kristy Garza, female    DOB: 1951/02/03, 71 y.o.   MRN: [id number]  Chief Complaint  Patient presents with   Numbness    In feet    HPI Patient is in today for  numbness in bilateral lower extremity for a few days. Patient reports adequate hydration and balanced diet. She denies fever, palpitations, blurry vision, syncope or any neurological changes.    Review of Systems  Constitutional: Negative.   HENT: Negative.    Eyes: Negative.   Respiratory: Negative.    Cardiovascular: Negative.   Musculoskeletal: Negative.   Skin: Negative.   Neurological:  Positive for tingling.  All other systems reviewed and are negative.       Objective:    BP 107/69   Pulse 83   Temp 97.6 F (36.4 C)   Ht _0  (1.702 m)   Wt 108 lb 9.6 oz (49.3 kg)   SpO2 96%   BMI 17.01 kg/m  BP Readings from Last 3 Encounters:  01/04/22 107/69  01/03/22 116/80  12/27/21 104/66      Physical Exam Vitals and nursing note reviewed.  Constitutional:      Appearance: Normal appearance.  HENT:     Head: Normocephalic.     Right Ear: External ear normal.     Left Ear: External ear normal.     Nose: Nose normal.     Mouth/Throat:     Mouth: Mucous membranes are moist.  Eyes:     Conjunctiva/sclera: Conjunctivae normal.  Cardiovascular:     Rate and Rhythm: Normal rate and regular rhythm.     Pulses: Normal pulses.     Heart sounds: Normal heart sounds.  Pulmonary:     Effort: Pulmonary effort is normal.     Breath sounds: Normal breath sounds.  Abdominal:     General: Bowel sounds are normal.  Skin:    General: Skin is dry.  Neurological:     General: No focal deficit present.     Mental Status: She is alert and oriented to person, place, and time. Mental status is at baseline.     Motor: Motor function is intact.     Coordination: Coordination is intact.     Gait: Gait is intact.     No results found for any visits on  01/04/22.      Assessment & Plan:  Patient reports paresthesia in the past few days. With numbness and tingling.  Completed labs: CBC, CMP, and B12, results pending.  Increase hydration and electrolytes.   Follow up with worsening unresolved symptoms.  Problem List Items Addressed This Visit   None Visit Diagnoses     Paresthesia of bilateral legs    -  Primary   Relevant Orders   Vitamin B12   CBC with Differential   CMP14+EGFR       No orders of the defined types were placed in this encounter.   Return if symptoms worsen or fail to improve.  Ivy Lynn, NP

## 2022-01-05 ENCOUNTER — Other Ambulatory Visit: Payer: Self-pay | Admitting: Nurse Practitioner

## 2022-01-05 DIAGNOSIS — D649 Anemia, unspecified: Secondary | ICD-10-CM

## 2022-01-05 LAB — CBC WITH DIFFERENTIAL/PLATELET
Basophils Absolute: 0 10*3/uL (ref 0.0–0.2)
Basos: 1 %
EOS (ABSOLUTE): 0.1 10*3/uL (ref 0.0–0.4)
Eos: 2 %
Hematocrit: 34.5 % (ref 34.0–46.6)
Hemoglobin: 11 g/dL — ABNORMAL LOW (ref 11.1–15.9)
Immature Grans (Abs): 0 10*3/uL (ref 0.0–0.1)
Immature Granulocytes: 0 %
Lymphocytes Absolute: 0.9 10*3/uL (ref 0.7–3.1)
Lymphs: 18 %
MCH: 30.2 pg (ref 26.6–33.0)
MCHC: 31.9 g/dL (ref 31.5–35.7)
MCV: 95 fL (ref 79–97)
Monocytes Absolute: 0.3 10*3/uL (ref 0.1–0.9)
Monocytes: 7 %
Neutrophils Absolute: 3.4 10*3/uL (ref 1.4–7.0)
Neutrophils: 72 %
Platelets: 228 10*3/uL (ref 150–450)
RBC: 3.64 x10E6/uL — ABNORMAL LOW (ref 3.77–5.28)
RDW: 11.4 % — ABNORMAL LOW (ref 11.7–15.4)
WBC: 4.6 10*3/uL (ref 3.4–10.8)

## 2022-01-05 LAB — CMP14+EGFR
ALT: 15 IU/L (ref 0–32)
AST: 23 IU/L (ref 0–40)
Albumin/Globulin Ratio: 2.2 (ref 1.2–2.2)
Albumin: 4.9 g/dL — ABNORMAL HIGH (ref 3.8–4.8)
Alkaline Phosphatase: 51 IU/L (ref 44–121)
BUN/Creatinine Ratio: 14 (ref 12–28)
BUN: 10 mg/dL (ref 8–27)
Bilirubin Total: 0.3 mg/dL (ref 0.0–1.2)
CO2: 22 mmol/L (ref 20–29)
Calcium: 9.6 mg/dL (ref 8.7–10.3)
Chloride: 102 mmol/L (ref 96–106)
Creatinine, Ser: 0.73 mg/dL (ref 0.57–1.00)
Globulin, Total: 2.2 g/dL (ref 1.5–4.5)
Glucose: 107 mg/dL — ABNORMAL HIGH (ref 70–99)
Potassium: 4.4 mmol/L (ref 3.5–5.2)
Sodium: 138 mmol/L (ref 134–144)
Total Protein: 7.1 g/dL (ref 6.0–8.5)
eGFR: 88 mL/min/{1.73_m2} (ref >59)

## 2022-01-05 LAB — VITAMIN B12: Vitamin B-12: 482 pg/mL (ref 232–1245)

## 2022-01-05 MED ORDER — FERROUS SULFATE 325 (65 FE) MG PO TBEC: 325.0000 mg | DELAYED_RELEASE_TABLET | Freq: Every day | ORAL | 1 refills | Status: DC

## 2022-01-06 ENCOUNTER — Telehealth: Payer: Self-pay

## 2022-01-06 MED ORDER — AMOXICILLIN-POT CLAVULANATE 875-125 MG PO TABS: 1.0000 | ORAL_TABLET | Freq: Two times a day (BID) | ORAL | 0 refills | Status: AC

## 2022-01-06 NOTE — Telephone Encounter (Signed)
I called the patient to inform her we sent in the Augmentin per her request to replace the doxycycline. She also stated she has been experiencing headaches and numbness from the doxycycline along with the nervousness and jittery feeling.   She plans to continue to drink plenty of water with the mucinex, and start using the ryaltris after doing a saline rinse.

## 2022-01-06 NOTE — Telephone Encounter (Signed)
We can change to Augmentin 875 mg twice daily for 7 more days.   Salvatore Marvel, MD [facility] [place] [facility] [place]

## 2022-01-06 NOTE — Telephone Encounter (Signed)
Patient called because she has been taking the doxycycline that was prescribed to her on Monday. She stated the doxycycline has been making her nervous/ jittery. This is day 3 of her taking 1 tablet.    She would like to know what she can take or if we can we could send in Augmentin as an alternative.    Dr.Kim is out please advise.   She has been using mucinex and drinking plenty of water. She has not used Ryaltris yet.

## 2022-01-11 MED ORDER — METHYLPREDNISOLONE 4 MG PO TBPK: ORAL_TABLET | ORAL | 0 refills | Status: DC

## 2022-01-11 NOTE — Telephone Encounter (Signed)
Spoke with patient. She is taking OTC decongestant pills that can make her shaky.  Take afrin instead BID.  Will send in medrol pak.  Finish antibiotics.

## 2022-01-11 NOTE — Addendum Note (Signed)
Addended by: Garnet Sierras on: 01/11/2022 12:34 PM   Modules accepted: Orders

## 2022-01-11 NOTE — Telephone Encounter (Signed)
Patient states she has taken 4 doses of the Augmentin and would like to know how many doses does it take before she will start  to feel better.   Please advise  Best contact number: [phone]

## 2022-01-11 NOTE — Telephone Encounter (Signed)
Please call patient.  It will take a few more days. Recommend that she finishes the whole course of Augmentin.  What symptoms is she having now?   We can add on a short course of prednisone as well if needed.

## 2022-01-13 ENCOUNTER — Encounter (HOSPITAL_COMMUNITY): Payer: Medicare HMO

## 2022-01-13 ENCOUNTER — Encounter (HOSPITAL_COMMUNITY)
Admission: RE | Admit: 2022-01-13 | Discharge: 2022-01-13 | Disposition: A | Discharge status: Home / Self Care | Payer: Medicare HMO | Source: Ambulatory Visit | Attending: Family Medicine | Admitting: Family Medicine

## 2022-01-13 NOTE — Telephone Encounter (Signed)
I spoke to pt and she has already called and spoken to her allergist and they changed her antibiotic and other meds and pt has a follow up with PCP in November.

## 2022-01-18 ENCOUNTER — Encounter (HOSPITAL_COMMUNITY): Payer: Medicare HMO

## 2022-01-18 ENCOUNTER — Other Ambulatory Visit: Payer: Self-pay | Admitting: Family Medicine

## 2022-01-18 DIAGNOSIS — J4540 Moderate persistent asthma, uncomplicated: Secondary | ICD-10-CM

## 2022-01-19 DIAGNOSIS — M5137 Other intervertebral disc degeneration, lumbosacral region: Secondary | ICD-10-CM | POA: Diagnosis not present

## 2022-01-19 DIAGNOSIS — M9903 Segmental and somatic dysfunction of lumbar region: Secondary | ICD-10-CM | POA: Diagnosis not present

## 2022-01-19 DIAGNOSIS — M9902 Segmental and somatic dysfunction of thoracic region: Secondary | ICD-10-CM | POA: Diagnosis not present

## 2022-01-19 DIAGNOSIS — M9901 Segmental and somatic dysfunction of cervical region: Secondary | ICD-10-CM | POA: Diagnosis not present

## 2022-01-28 ENCOUNTER — Ambulatory Visit (INDEPENDENT_AMBULATORY_CARE_PROVIDER_SITE_OTHER): Payer: Medicare HMO | Admitting: Family Medicine

## 2022-01-28 ENCOUNTER — Telehealth: Payer: Self-pay | Admitting: Allergy

## 2022-01-28 ENCOUNTER — Encounter: Payer: Self-pay | Admitting: Family Medicine

## 2022-01-28 DIAGNOSIS — J310 Chronic rhinitis: Secondary | ICD-10-CM | POA: Diagnosis not present

## 2022-01-28 DIAGNOSIS — J4540 Moderate persistent asthma, uncomplicated: Secondary | ICD-10-CM

## 2022-01-28 DIAGNOSIS — B999 Unspecified infectious disease: Secondary | ICD-10-CM | POA: Diagnosis not present

## 2022-01-28 NOTE — Progress Notes (Signed)
RE: Kristy Garza MRN: [id number] DOB: [birth year] Date of Telemedicine Visit: 01/28/2022  Referring provider: [name], DO Primary care provider: [name], DO  Chief Complaint: Nasal Congestion and Cough   Telemedicine Follow Up Visit via Telephone: I connected with Kristy Garza for a follow up on 01/28/22 by telephone and verified that I am speaking with the cor/rect person using two identifiers.   I discussed the limitations, risks, security and privacy concerns of performing an evaluation and management service by telephone and the availability of in person appointments. I also discussed with the patient that there may be a patient responsible charge related to this service. The patient expressed understanding and agreed to proceed.  Patient is at home  Provider is at the office.  Visit start time: 7 Visit end time: 6 Insurance consent/check in by: [facility] consent and medical assistant/nurse: Ashleigh  History of Present Illness: She is a 71 y.o. female, who is being followed for asthma, chronic rhinitis, reflux, and recurrent infection. Her previous allergy office visit was on 01/03/2022 with Dr. Maudie Mercury. In the interim, she has received a steroid injection and completed a course of antibiotic with relief of symptoms. At today's visit, she reports that she was visiting her daughter and her grandchildren had cold symptoms. She reports that on Wednesday she began to develop a dry cough, headache, some body aches, and post nasal drainage. She denies fever, sweats, chills, and other sick contacts. She reports that she did a home COVID test that was negative today. At today's visit, she reports her asthma has been moderately well controlled with dry cough for the last 3 days. She denies shortness of breath or wheeze with rest or activity. She continues Arnutiy 200-1 puff once a day which she began a few days ago. Prior to that she reports that she had been using  Symbicort which made her nervous and shaky. She has been using albuterol daily for the last 3 days with relief of symptoms. Chronic rhinitis is reported as not well controlled with symptoms including clear thin rhinorrhea, nasal congestion, occasional sneeze, and post nasal drainage for the last 3 days. She continues Allegra once a day and has recently started Ryaltris. She reports occasional use of NeilMed nasal rinse. Her current medications are listed in the chart.   Assessment and Plan: Kristy Garza is a 71 y.o. female with: Patient Instructions  Asthma Continue Arnuity 200 to 1 puff once a day to prevent cough and wheeze. For the next few days, use albuterol about 10 minutes before you take your Arnuity dose Continue albuterol 2 puffs once every 4 hours as needed for cough or wheeze You may use albuterol 2 puffs 5 to 15 minutes before activity to decrease cough or wheeze  Chronic rhinitis Continue Rylatris 2 sprays in each nostril twice a day as needed for nasal symptoms.  Consider saline nasal rinses as needed for nasal symptoms. Use this before any medicated nasal sprays for best result Try to stop Allegra for the next few days. Then you may take Allegra once a day as needed for a runny nose or itch Begin Mucinex [phone] mg twice a day and increase fluid intake to thin out mucus  Recurrent infections Keep track of infections, antibiotic and steroid use  Call the clinic if this treatment plan is not working well for you  Follow up in 2 months or sooner if needed.  No follow-ups on file.   Medication List:  Current Outpatient  Medications  Medication Sig Dispense Refill   acetaminophen (TYLENOL) 500 MG tablet Take by mouth.     albuterol (VENTOLIN HFA) 108 (90 Base) MCG/ACT inhaler TAKE 2 PUFFS BY MOUTH EVERY 6 HOURS AS NEEDED FOR WHEEZE OR SHORTNESS OF BREATH 18 each 0   ascorbic acid (VITAMIN C) 1000 MG tablet Take by mouth.     aspirin-acetaminophen-caffeine (EXCEDRIN MIGRAINE)  250-250-65 MG tablet Take by mouth.     b complex vitamins capsule Take 1 capsule by mouth daily.     calcium citrate-vitamin D (CITRACAL+D) 315-200 MG-UNIT tablet Take by mouth.     Cholecalciferol (VITAMIN D3) 125 MCG (5000 UT) CAPS Take 5,000 Units by mouth daily.     Dermatological Products, Misc. (SUVICORT) EMUL      ferrous sulfate (FE TABS) 325 (65 FE) MG EC tablet Take 1 tablet (325 mg total) by mouth daily with breakfast. 90 tablet 1   hydrOXYzine (ATARAX) 25 MG tablet Take 25 mg by mouth at bedtime.     hyoscyamine (LEVSIN SL) 0.125 MG SL tablet Place under the tongue every 4 (four) hours as needed.     lidocaine (XYLOCAINE) 2 % jelly APPLY TOPICALLY TO AFFECTED AREA EVERY DAY AS NEEDED (use sparingly) 85 g 0   Lidocaine-Collagen-Aloe Vera (REGENECARE) 2 % GEL Apply topically daily. 85 g 2   melatonin 5 MG TABS Take 5 mg by mouth.     Meth-Hyo-M Bl-Na Phos-Ph Sal (URO-MP) 118 MG CAPS Take 1 capsule by mouth 4 (four) times daily as needed.     mirtazapine (REMERON) 7.5 MG tablet Take 7.5 mg by mouth at bedtime.     Olopatadine-Mometasone (RYALTRIS) [id number] MCG/ACT SUSP Place 1-2 sprays into the nose in the morning and at bedtime. 29 g 5   Polyethylene Glycol 3350 (MIRALAX PO) Take by mouth as needed.     Probiotic Product (PROBIOTIC PO) Take by mouth daily.     Simethicone (GAS-X PO) Take by mouth.     tizanidine (ZANAFLEX) 2 MG capsule Take 1-2 capsules (2-4 mg total) by mouth 3 (three) times daily as needed for muscle spasms. 60 capsule 2   traMADol (ULTRAM) 50 MG tablet Take 1 tablet (50 mg total) by mouth every 12 (twelve) hours as needed. 30 tablet 1   UNABLE TO FIND Take 1 capsule by mouth daily. Dv3- vitamin D 3 plus immune support     Current Facility-Administered Medications  Medication Dose Route Frequency Provider Last Rate Last Admin   zoledronic acid (RECLAST) injection 5 mg  5 mg Intravenous Once Ronnie Doss M, DO       Allergies: Allergies  Allergen Reactions    Cefuroxime Axetil Other (See Comments)    Extreme gas   Gabapentin Other (See Comments)    Constipation Constipation   Montelukast Other (See Comments)    Numbness and tingling in hand. Numbness and tingling in hand. Numbness and tingling in hand. NUMBNESS OF HANDS AND FEET. Numbness in hands and feet NUMBNESS OF HANDS AND FEET.   Pregabalin Other (See Comments)    Drowsiness, dry mouth Drowsiness, dry mouth Makes her tired and thirsty   Augmentin [Amoxicillin-Pot Clavulanate] Diarrhea   Avelox [Moxifloxacin Hcl In Nacl] Other (See Comments)    Tremors    Biaxin [Clarithromycin]     Unsure of reaction   Cedax [Ceftibuten] Other (See Comments)    tremors   Ciprofloxacin Other (See Comments) and Nausea And Vomiting    Blurred vision Blurred vision Pt can't remember reaction  Clindamycin/Lincomycin     tachycardia   Levofloxacin Other (See Comments)    Feels dehydrated   Lincomycin Other (See Comments)    tachycardia   Montelukast Sodium Other (See Comments)    Numbness and tingling in hand. Numbness and tingling in hand.   Sulfonamide Derivatives Other (See Comments)    Gi upset   Sulfa Antibiotics Nausea Only    Other reaction(s): Other (See Comments) Gi upset  Other reaction(s): Other (See Comments) Gi upset Other reaction(s): Unknown   I reviewed her past medical history, social history, family history, and environmental history and no significant changes have been reported from previous visit on 01/03/2022.   Objective: Physical Exam Not obtained as encounter was done via telephone.   Previous notes and tests were reviewed.  I discussed the assessment and treatment plan with the patient. The patient was provided an opportunity to ask questions and all were answered. The patient agreed with the plan and demonstrated an understanding of the instructions.   The patient was advised to call back or seek an in-person evaluation if the symptoms worsen or if the  condition fails to improve as anticipated.  I provided 19 minutes of non-face-to-face time during this encounter.  It was my pleasure to participate in [place] care today. Please feel free to contact me with any questions or concerns.   Sincerely,  Gareth Morgan, FNP

## 2022-01-28 NOTE — Telephone Encounter (Signed)
Patient called and stated that her grandkids had a cold and she believes she caught it from them. Patient states that she has a cough, her ear hurts and she has been having headaches. Patient requests a call back to discuss what she can do for these symptoms. Patients call back number is [phone]

## 2022-01-28 NOTE — Telephone Encounter (Signed)
Please have this patient test for COVID. She has had a lot of steroid recently. Can you please offer a televisit? Thank you

## 2022-01-28 NOTE — Patient Instructions (Addendum)
Asthma Continue Arnuity 200 to 1 puff once a day to prevent cough and wheeze. For the next few days, use albuterol about 10 minutes before you take your Arnuity dose Continue albuterol 2 puffs once every 4 hours as needed for cough or wheeze You may use albuterol 2 puffs 5 to 15 minutes before activity to decrease cough or wheeze  Chronic rhinitis Continue Rylatris 2 sprays in each nostril twice a day as needed for nasal symptoms.  Consider saline nasal rinses as needed for nasal symptoms. Use this before any medicated nasal sprays for best result Try to stop Allegra for the next few days. Then you may take Allegra once a day as needed for a runny nose or itch Begin Mucinex [phone] mg twice a day and increase fluid intake to thin out mucus  Recurrent infections Keep track of infections, antibiotic and steroid use  Call the clinic if this treatment plan is not working well for you  Follow up in 2 months or sooner if needed.

## 2022-01-28 NOTE — Telephone Encounter (Signed)
Patient was negative for COVID. She has been scheduled to have a televisit with Webb Silversmith today.

## 2022-01-28 NOTE — Telephone Encounter (Signed)
Called and spoke with the patient and she stated that she has been having a lot of congestion and intermittent ear pain and body aches, she denies fever or chills. She states that her symptoms started Wednesday after she was exposed to her grandchildren last Friday. She states that she has been using Ryaltris nasal spray to help and she still has a prednisone pack that she was wondering if she could start taking. I did advise the patient to test herself for COVID to be on the safe side. Patient states that she has an at home test and will try to test herself. Do you have any further recommendation since Dr. Maudie Mercury is out of office?

## 2022-02-04 ENCOUNTER — Ambulatory Visit (INDEPENDENT_AMBULATORY_CARE_PROVIDER_SITE_OTHER): Payer: Medicare HMO | Admitting: Family Medicine

## 2022-02-04 ENCOUNTER — Encounter: Payer: Self-pay | Admitting: Family Medicine

## 2022-02-04 ENCOUNTER — Telehealth: Payer: Self-pay | Admitting: Family Medicine

## 2022-02-04 VITALS — BP 113/70 | HR 80 | Temp 97.6°F | Ht 67.0 in | Wt 112.4 lb

## 2022-02-04 DIAGNOSIS — D500 Iron deficiency anemia secondary to blood loss (chronic): Secondary | ICD-10-CM

## 2022-02-04 DIAGNOSIS — K5900 Constipation, unspecified: Secondary | ICD-10-CM | POA: Diagnosis not present

## 2022-02-04 DIAGNOSIS — J301 Allergic rhinitis due to pollen: Secondary | ICD-10-CM | POA: Diagnosis not present

## 2022-02-04 MED ORDER — SENNA-DOCUSATE SODIUM 8.6-50 MG PO TABS: 1.0000 | ORAL_TABLET | Freq: Every day | ORAL | 0 refills | Status: DC

## 2022-02-04 NOTE — Telephone Encounter (Signed)
Patient aware and verbalizes understanding. 

## 2022-02-04 NOTE — Telephone Encounter (Signed)
Was filled by   Garnet Sierras, DO    Is this okay to refill?

## 2022-02-04 NOTE — Progress Notes (Signed)
Subjective:  Patient ID: Kristy Garza, female    DOB: 07/02/1950, 71 y.o.   MRN: [id number]  Patient Care Team: [name], DO as PCP - General (Family Medicine) Deneise Lever, MD as Consulting Physician (Pulmonary Disease) Minus Breeding, MD as Consulting Physician (Cardiology) Katha Cabal, LCSW as Social Worker (Licensed Clinical Social Worker)   Chief Complaint:  low iron  (Patient states that she can not take her iron pill due to constipation and would like to know what she can take to help her stay regular. ) and Allergic Rhinitis  (Seen allergies and would like her eyes and ears re checked )   HPI: Kristy Garza is a 71 y.o. female presenting on 02/04/2022 for low iron  (Patient states that she can not take her iron pill due to constipation and would like to know what she can take to help her stay regular. ) and Allergic Rhinitis  (Seen allergies and would like her eyes and ears re checked )    1. Iron deficiency anemia due to chronic blood loss Pt states she stopped her iron due to increased constipation. She stopped this over a week ago.   2. Seasonal allergic rhinitis due to pollen Was seen by allergist and started on new medications 3 days ago. States she is still having symptoms.   3. Constipation in female Has not been taking her Miralax daily as she feels it is not beneficial. States she did have a good bowel movement this morning due to stopping her iron repletion therapy.      Relevant past medical, surgical, family, and social history reviewed and updated as indicated.  Allergies and medications reviewed and updated. Data reviewed: Chart in Epic.   Past Medical History:  Diagnosis Date   Abdominal pain 01/16/2013   Acid reflux    Allergy    Arthritis    ARTHRITIS IN NECK BY DR. Alroy Dust [facility]   Asthma    Interstitial cystitis    Osteoporosis    PONV (postoperative nausea and vomiting)    Tinnitus    Trigeminal neuralgia     Atypical trigeminal neuralgia    Past Surgical History:  Procedure Laterality Date   ABDOMINAL HYSTERECTOMY  2000   CYSTOSCOPY WITH HYDRODISTENSION AND BIOPSY N/A 06/11/2012   Procedure: CYSTOSCOPY/BIOPSY/HYDRODISTENSION with Instillation of Pyridium and Marcaine and Kenalog;  Surgeon: Ailene Rud, MD;  Location: [facility];  Service: Urology;  Laterality: N/A;  1 hour requested for this case  BLADDER BIOPSY   ETHMOIDECTOMY  2012   SEPTOPLASTY  1980's   vocal cord surgery   1990's   polyp removal    Social History   Socioeconomic History   Marital status: Married    Spouse name: [name]   Number of children: 3   Years of education: 12   Highest education level: Some college, no degree  Occupational History   Occupation: CNA    Comment: Retired  Tobacco Use   Smoking status: Never   Smokeless tobacco: Never  Vaping Use   Vaping Use: Never used  Substance and Sexual Activity   Alcohol use: No    Alcohol/week: 0.0 standard drinks of alcohol    Comment: 01-19-2016 per pt no   Drug use: No    Comment: 01-19-2016 per pt no    Sexual activity: Not Currently    Birth control/protection: Surgical  Other Topics Concern   Not on file  Social History Narrative  Lives with husband.    Social Determinants of Health   Financial Resource Strain: Low Risk  (09/13/2019)   Overall Financial Resource Strain (CARDIA)    Difficulty of Paying Living Expenses: Not hard at all  Food Insecurity: No Food Insecurity (09/13/2019)   Hunger Vital Sign    Worried About Running Out of Food in the Last Year: Never true    Ran Out of Food in the Last Year: Never true  Transportation Needs: Unmet Transportation Needs (03/08/2021)   PRAPARE - Transportation    Lack of Transportation (Medical): No    Lack of Transportation (Non-Medical): Yes  Physical Activity: Inactive (05/03/2021)   Exercise Vital Sign    Days of Exercise per Week: 0 days    Minutes of Exercise per  Session: 0 min  Stress: Stress Concern Present (05/03/2021)   [place]    Feeling of Stress : Rather much  Social Connections: Socially Integrated (09/13/2019)   Social Connection and Isolation Panel [NHANES]    Frequency of Communication with Friends and Family: More than three times a week    Frequency of Social Gatherings with Friends and Family: More than three times a week    Attends Religious Services: More than 4 times per year    Active Member of [employer] or Organizations: Yes    Attends Music therapist: More than 4 times per year    Marital Status: Married  Human resources officer Violence: Not At Risk (09/13/2019)   Humiliation, Afraid, Rape, and Kick questionnaire    Fear of Current or Ex-Partner: No    Emotionally Abused: No    Physically Abused: No    Sexually Abused: No    Outpatient Encounter Medications as of 02/04/2022  Medication Sig   acetaminophen (TYLENOL) 500 MG tablet Take by mouth.   albuterol (VENTOLIN HFA) 108 (90 Base) MCG/ACT inhaler TAKE 2 PUFFS BY MOUTH EVERY 6 HOURS AS NEEDED FOR WHEEZE OR SHORTNESS OF BREATH   ascorbic acid (VITAMIN C) 1000 MG tablet Take by mouth.   aspirin-acetaminophen-caffeine (EXCEDRIN MIGRAINE) 250-250-65 MG tablet Take by mouth.   b complex vitamins capsule Take 1 capsule by mouth daily.   calcium citrate-vitamin D (CITRACAL+D) 315-200 MG-UNIT tablet Take by mouth.   Cholecalciferol (VITAMIN D3) 125 MCG (5000 UT) CAPS Take 5,000 Units by mouth daily.   Dermatological Products, Misc. (SUVICORT) EMUL    ferrous sulfate (FE TABS) 325 (65 FE) MG EC tablet Take 1 tablet (325 mg total) by mouth daily with breakfast.   hydrOXYzine (ATARAX) 25 MG tablet Take 25 mg by mouth at bedtime.   hyoscyamine (LEVSIN SL) 0.125 MG SL tablet Place under the tongue every 4 (four) hours as needed.   lidocaine (XYLOCAINE) 2 % jelly APPLY TOPICALLY TO AFFECTED AREA EVERY DAY AS NEEDED  (use sparingly)   Lidocaine-Collagen-Aloe Vera (REGENECARE) 2 % GEL Apply topically daily.   melatonin 5 MG TABS Take 5 mg by mouth.   Meth-Hyo-M Bl-Na Phos-Ph Sal (URO-MP) 118 MG CAPS Take 1 capsule by mouth 4 (four) times daily as needed.   mirtazapine (REMERON) 7.5 MG tablet Take 7.5 mg by mouth at bedtime.   Olopatadine-Mometasone (RYALTRIS) [id number] MCG/ACT SUSP Place 1-2 sprays into the nose in the morning and at bedtime.   Polyethylene Glycol 3350 (MIRALAX PO) Take by mouth as needed.   Probiotic Product (PROBIOTIC PO) Take by mouth daily.   sennosides-docusate sodium (SENOKOT-S) 8.6-50 MG tablet Take 1 tablet by mouth  daily.   Simethicone (GAS-X PO) Take by mouth.   tizanidine (ZANAFLEX) 2 MG capsule Take 1-2 capsules (2-4 mg total) by mouth 3 (three) times daily as needed for muscle spasms.   traMADol (ULTRAM) 50 MG tablet Take 1 tablet (50 mg total) by mouth every 12 (twelve) hours as needed.   UNABLE TO FIND Take 1 capsule by mouth daily. Dv3- vitamin D 3 plus immune support   Facility-Administered Encounter Medications as of 02/04/2022  Medication   zoledronic acid (RECLAST) injection 5 mg    Allergies  Allergen Reactions   Cefuroxime Axetil Other (See Comments)    Extreme gas   Gabapentin Other (See Comments)    Constipation Constipation   Montelukast Other (See Comments)    Numbness and tingling in hand. Numbness and tingling in hand. Numbness and tingling in hand. NUMBNESS OF HANDS AND FEET. Numbness in hands and feet NUMBNESS OF HANDS AND FEET.   Pregabalin Other (See Comments)    Drowsiness, dry mouth Drowsiness, dry mouth Makes her tired and thirsty   Augmentin [Amoxicillin-Pot Clavulanate] Diarrhea   Avelox [Moxifloxacin Hcl In Nacl] Other (See Comments)    Tremors    Biaxin [Clarithromycin]     Unsure of reaction   Cedax [Ceftibuten] Other (See Comments)    tremors   Ciprofloxacin Other (See Comments) and Nausea And Vomiting    Blurred vision Blurred  vision Pt can't remember reaction   Clindamycin/Lincomycin     tachycardia   Levofloxacin Other (See Comments)    Feels dehydrated   Lincomycin Other (See Comments)    tachycardia   Montelukast Sodium Other (See Comments)    Numbness and tingling in hand. Numbness and tingling in hand.   Sulfonamide Derivatives Other (See Comments)    Gi upset   Sulfa Antibiotics Nausea Only    Other reaction(s): Other (See Comments) Gi upset  Other reaction(s): Other (See Comments) Gi upset Other reaction(s): Unknown    Review of Systems  Constitutional:  Negative for activity change, appetite change, chills, diaphoresis, fatigue, fever and unexpected weight change.  HENT:  Positive for congestion, postnasal drip and rhinorrhea. Negative for dental problem, drooling, ear discharge, ear pain, facial swelling, hearing loss, mouth sores, nosebleeds, sinus pressure, sinus pain, sneezing, sore throat, tinnitus, trouble swallowing and voice change.   Eyes:  Positive for discharge and itching.  Respiratory:  Negative for cough, chest tightness and shortness of breath.   Cardiovascular:  Negative for chest pain, palpitations and leg swelling.  Gastrointestinal:  Positive for constipation. Negative for abdominal pain, blood in stool, diarrhea, nausea and vomiting.  Endocrine: Negative.   Genitourinary:  Negative for decreased urine volume, difficulty urinating, dysuria, frequency and urgency.  Musculoskeletal:  Negative for arthralgias and myalgias.  Skin: Negative.   Allergic/Immunologic: Negative.   Neurological:  Negative for dizziness, weakness and headaches.  Hematological: Negative.   Psychiatric/Behavioral:  Negative for confusion, hallucinations, sleep disturbance and suicidal ideas.   All other systems reviewed and are negative.       Objective:  BP 113/70   Pulse 80   Temp 97.6 F (36.4 C) (Temporal)   Ht _0  (1.702 m)   Wt 112 lb 6.4 oz (51 kg)   SpO2 99%   BMI 17.60 kg/m     Wt Readings from Last 3 Encounters:  02/04/22 112 lb 6.4 oz (51 kg)  01/04/22 108 lb 9.6 oz (49.3 kg)  01/03/22 110 lb 3.2 oz (50 kg)    Physical Exam Vitals and  nursing note reviewed.  Constitutional:      General: She is not in acute distress.    Appearance: Normal appearance. She is well-developed, well-groomed and underweight. She is not ill-appearing, toxic-appearing or diaphoretic.  HENT:     Head: Normocephalic and atraumatic.     Jaw: There is normal jaw occlusion.     Right Ear: Hearing, tympanic membrane, ear canal and external ear normal.     Left Ear: Hearing, tympanic membrane, ear canal and external ear normal.     Nose: Nose normal.     Mouth/Throat:     Lips: Pink.     Mouth: Mucous membranes are moist.     Pharynx: Oropharynx is clear. Uvula midline.  Eyes:     General: Lids are normal.     Extraocular Movements: Extraocular movements intact.     Conjunctiva/sclera: Conjunctivae normal.     Pupils: Pupils are equal, round, and reactive to light.  Neck:     Thyroid: No thyroid mass, thyromegaly or thyroid tenderness.     Vascular: No carotid bruit or JVD.     Trachea: Trachea and phonation normal.  Cardiovascular:     Rate and Rhythm: Normal rate and regular rhythm.     Chest Wall: PMI is not displaced.     Pulses: Normal pulses.     Heart sounds: Normal heart sounds. No murmur heard.    No friction rub. No gallop.  Pulmonary:     Effort: Pulmonary effort is normal. No respiratory distress.     Breath sounds: Normal breath sounds. No wheezing.  Abdominal:     General: Bowel sounds are normal. There is no distension or abdominal bruit.     Palpations: Abdomen is soft. There is no hepatomegaly or splenomegaly.     Tenderness: There is no abdominal tenderness. There is no right CVA tenderness or left CVA tenderness.     Hernia: No hernia is present.  Musculoskeletal:        General: Normal range of motion.     Cervical back: Normal range of motion and  neck supple.     Right lower leg: No edema.     Left lower leg: No edema.  Lymphadenopathy:     Cervical: No cervical adenopathy.  Skin:    General: Skin is warm and dry.     Capillary Refill: Capillary refill takes less than 2 seconds.     Coloration: Skin is not cyanotic, jaundiced or pale.     Findings: No rash.  Neurological:     General: No focal deficit present.     Mental Status: She is alert and oriented to person, place, and time.     Sensory: Sensation is intact.     Motor: Motor function is intact.     Coordination: Coordination is intact.     Gait: Gait is intact.     Deep Tendon Reflexes: Reflexes are normal and symmetric.  Psychiatric:        Attention and Perception: Attention and perception normal.        Mood and Affect: Mood and affect normal.        Speech: Speech normal.        Behavior: Behavior normal. Behavior is cooperative.        Thought Content: Thought content normal.        Cognition and Memory: Cognition and memory normal.        Judgment: Judgment normal.     Results for orders placed  or performed in visit on 01/04/22  Vitamin B12  Result Value Ref Range   Vitamin B-12 482 232 - 1,245 pg/mL  CBC with Differential  Result Value Ref Range   WBC 4.6 3.4 - 10.8 x10E3/uL   RBC 3.64 (L) 3.77 - 5.28 x10E6/uL   Hemoglobin 11.0 (L) 11.1 - 15.9 g/dL   Hematocrit 34.5 34.0 - 46.6 %   MCV 95 79 - 97 fL   MCH 30.2 26.6 - 33.0 pg   MCHC 31.9 31.5 - 35.7 g/dL   RDW 11.4 (L) 11.7 - 15.4 %   Platelets 228 150 - 450 x10E3/uL   Neutrophils 72 Not Estab. %   Lymphs 18 Not Estab. %   Monocytes 7 Not Estab. %   Eos 2 Not Estab. %   Basos 1 Not Estab. %   Neutrophils Absolute 3.4 1.4 - 7.0 x10E3/uL   Lymphocytes Absolute 0.9 0.7 - 3.1 x10E3/uL   Monocytes Absolute 0.3 0.1 - 0.9 x10E3/uL   EOS (ABSOLUTE) 0.1 0.0 - 0.4 x10E3/uL   Basophils Absolute 0.0 0.0 - 0.2 x10E3/uL   Immature Granulocytes 0 Not Estab. %   Immature Grans (Abs) 0.0 0.0 - 0.1 x10E3/uL   CMP14+EGFR  Result Value Ref Range   Glucose 107 (H) 70 - 99 mg/dL   BUN 10 8 - 27 mg/dL   Creatinine, Ser 0.73 0.57 - 1.00 mg/dL   eGFR 88 >59 mL/min/1.73   BUN/Creatinine Ratio 14 12 - 28   Sodium 138 134 - 144 mmol/L   Potassium 4.4 3.5 - 5.2 mmol/L   Chloride 102 96 - 106 mmol/L   CO2 22 20 - 29 mmol/L   Calcium 9.6 8.7 - 10.3 mg/dL   Total Protein 7.1 6.0 - 8.5 g/dL   Albumin 4.9 (H) 3.8 - 4.8 g/dL   Globulin, Total 2.2 1.5 - 4.5 g/dL   Albumin/Globulin Ratio 2.2 1.2 - 2.2   Bilirubin Total 0.3 0.0 - 1.2 mg/dL   Alkaline Phosphatase 51 44 - 121 IU/L   AST 23 0 - 40 IU/L   ALT 15 0 - 32 IU/L   *Note: Due to a large number of results and/or encounters for the requested time period, some results have not been displayed. A complete set of results can be found in Results Review.       Pertinent labs & imaging results that were available during my care of the patient were reviewed by me and considered in my medical decision making.  Assessment & Plan:  Shalane was seen today for low iron  and allergic rhinitis .  Diagnoses and all orders for this visit:  Iron deficiency anemia due to chronic blood loss Stopped iron due to constipation. Will recheck anemia panel today. Will restart regimen if warranted.  -     Anemia Profile B  Seasonal allergic rhinitis due to pollen Was started on new medications by her allergist 3 days ago. Aware to give these medications time to work. If not beneficial, aware to follow up with allergist.   Constipation in female Advised to see her GI and take miralax daily. Can take below as needed if she does not have a bowel movement in 3 days after restarting her Miralax.  -     sennosides-docusate sodium (SENOKOT-S) 8.6-50 MG tablet; Take 1 tablet by mouth daily.     Continue all other maintenance medications.  Follow up plan: Return if symptoms worsen or fail to improve.   Continue healthy lifestyle choices, including  diet (rich in fruits,  vegetables, and lean proteins, and low in salt and simple carbohydrates) and exercise (at least 30 minutes of moderate physical activity daily).  Educational handout given for constipation  The above assessment and management plan was discussed with the patient. The patient verbalized understanding of and has agreed to the management plan. Patient is aware to call the clinic if they develop any new symptoms or if symptoms persist or worsen. Patient is aware when to return to the clinic for a follow-up visit. Patient educated on when it is appropriate to go to the emergency department.   Monia Pouch, FNP-C [facility] [phone]

## 2022-02-05 LAB — ANEMIA PROFILE B
Basophils Absolute: 0 10*3/uL (ref 0.0–0.2)
Basos: 1 %
EOS (ABSOLUTE): 0.2 10*3/uL (ref 0.0–0.4)
Eos: 6 %
Ferritin: 33 ng/mL (ref 15–150)
Folate: >20 ng/mL (ref >3.0)
Hematocrit: 31.8 % — ABNORMAL LOW (ref 34.0–46.6)
Hemoglobin: 10.4 g/dL — ABNORMAL LOW (ref 11.1–15.9)
Immature Grans (Abs): 0 10*3/uL (ref 0.0–0.1)
Immature Granulocytes: 1 %
Iron Saturation: 19 % (ref 15–55)
Iron: 61 ug/dL (ref 27–139)
Lymphocytes Absolute: 0.7 10*3/uL (ref 0.7–3.1)
Lymphs: 20 %
MCH: 32 pg (ref 26.6–33.0)
MCHC: 32.7 g/dL (ref 31.5–35.7)
MCV: 98 fL — ABNORMAL HIGH (ref 79–97)
Monocytes Absolute: 0.3 10*3/uL (ref 0.1–0.9)
Monocytes: 10 %
Neutrophils Absolute: 2.1 10*3/uL (ref 1.4–7.0)
Neutrophils: 62 %
Platelets: 221 10*3/uL (ref 150–450)
RBC: 3.25 x10E6/uL — ABNORMAL LOW (ref 3.77–5.28)
RDW: 11 % — ABNORMAL LOW (ref 11.7–15.4)
Retic Ct Pct: 1.9 % (ref 0.6–2.6)
Total Iron Binding Capacity: 318 ug/dL (ref 250–450)
UIBC: 257 ug/dL (ref 118–369)
Vitamin B-12: 451 pg/mL (ref 232–1245)
WBC: 3.3 10*3/uL — ABNORMAL LOW (ref 3.4–10.8)

## 2022-02-08 ENCOUNTER — Telehealth: Payer: Self-pay | Admitting: Allergy

## 2022-02-08 NOTE — Telephone Encounter (Signed)
Patient called requesting Ryaltris sample from [facility] [place] [facility].   Called and spoke to [place] - stated they did have ryaltris samples for pick up.   Advised patient a sample would be ready for her in [place]. Patient verbalized understanding.

## 2022-02-10 ENCOUNTER — Other Ambulatory Visit: Payer: Self-pay | Admitting: Family Medicine

## 2022-02-10 DIAGNOSIS — M5137 Other intervertebral disc degeneration, lumbosacral region: Secondary | ICD-10-CM | POA: Diagnosis not present

## 2022-02-10 DIAGNOSIS — M9902 Segmental and somatic dysfunction of thoracic region: Secondary | ICD-10-CM | POA: Diagnosis not present

## 2022-02-10 DIAGNOSIS — M9903 Segmental and somatic dysfunction of lumbar region: Secondary | ICD-10-CM | POA: Diagnosis not present

## 2022-02-10 DIAGNOSIS — J4540 Moderate persistent asthma, uncomplicated: Secondary | ICD-10-CM

## 2022-02-10 DIAGNOSIS — M9901 Segmental and somatic dysfunction of cervical region: Secondary | ICD-10-CM | POA: Diagnosis not present

## 2022-02-11 ENCOUNTER — Encounter: Payer: Self-pay | Admitting: Internal Medicine

## 2022-02-11 ENCOUNTER — Telehealth: Payer: Self-pay | Admitting: Family Medicine

## 2022-02-11 MED ORDER — OLOPATADINE HCL 0.1 % OP SOLN: 1.0000 [drp] | Freq: Two times a day (BID) | OPHTHALMIC | 5 refills | Status: DC | PRN

## 2022-02-11 NOTE — Progress Notes (Signed)
Patient called regarding persistent post nasal drainage, watery eyes, runny nose and headaches.  Covid test negative at home. This has been ongoing since she got sick in October. Could be due to a new viral illness or post infectious drainage or uncontrolled rhinitis. She is taking Ryaltris 1SEN BID and does report dry nose with it sometimes. Also on Mucinex PRN. Recommend doing nasal rises with a rinse bottle prior to Ryaltris. Can res tart Allegra as that was previously helping. Also okay to continue Mucinex PRN. Will send in Patanol eye drops to use as needed.

## 2022-02-11 NOTE — Telephone Encounter (Signed)
Patient returning call. Please call back

## 2022-02-11 NOTE — Telephone Encounter (Signed)
Patient Aware of labs and per pt request I have faxed lab work to gi dr

## 2022-02-11 NOTE — Telephone Encounter (Signed)
Lmtcb.

## 2022-02-13 ENCOUNTER — Other Ambulatory Visit: Payer: Self-pay | Admitting: Allergy & Immunology

## 2022-02-14 NOTE — Telephone Encounter (Signed)
Last AVS in 11/23. States for the patient to continue this medication.

## 2022-02-23 ENCOUNTER — Encounter: Payer: Self-pay | Admitting: Family Medicine

## 2022-02-23 ENCOUNTER — Ambulatory Visit (INDEPENDENT_AMBULATORY_CARE_PROVIDER_SITE_OTHER): Payer: Medicare HMO | Admitting: Family Medicine

## 2022-02-23 VITALS — BP 114/71 | HR 68 | Temp 97.6°F | Ht 67.0 in | Wt 115.0 lb

## 2022-02-23 DIAGNOSIS — D649 Anemia, unspecified: Secondary | ICD-10-CM

## 2022-02-23 DIAGNOSIS — M810 Age-related osteoporosis without current pathological fracture: Secondary | ICD-10-CM | POA: Diagnosis not present

## 2022-02-23 DIAGNOSIS — J310 Chronic rhinitis: Secondary | ICD-10-CM

## 2022-02-23 DIAGNOSIS — D72819 Decreased white blood cell count, unspecified: Secondary | ICD-10-CM

## 2022-02-23 NOTE — Progress Notes (Signed)
Subjective: CC: Allergic rhinitis PCP: [name], DO TML:Kristy Garza is a 71 y.o. female presenting to clinic today for:  1.  Allergic rhinitis Patient reports that she has been struggling with rhinorrhea, postnasal drip and constant need for clearing of her throat.  Her throat feels irritated.  This is been ongoing since October.  She contacted her allergist and has been following their instructions but really has not felt any reprieve.  She has been treated with multiple modalities of antihistamines and takes Benadryl at nighttime regularly for sleep.  She has only been using 1 spray of her nasal spray in each nostril because she finds it dries her out that she has not increased it to dry up the drainage that she is complaining about today.  No fevers.  No shortness of breath.  No wheezing.  2.  Anemia Noted to have ongoing downtrend in her hemoglobin a few weeks ago.  No apparent deficiencies but she had been slacking off on iron supplementation due to chronic constipation issues.  Has not followed up with her GI with regards to this. Apparently, he is retiring and she will need to establish with someone else in that office.   ROS: Per HPI  Allergies  Allergen Reactions   Cefuroxime Axetil Other (See Comments)    Extreme gas   Gabapentin Other (See Comments)    Constipation Constipation   Montelukast Other (See Comments)    Numbness and tingling in hand. Numbness and tingling in hand. Numbness and tingling in hand. NUMBNESS OF HANDS AND FEET. Numbness in hands and feet NUMBNESS OF HANDS AND FEET.   Pregabalin Other (See Comments)    Drowsiness, dry mouth Drowsiness, dry mouth Makes her tired and thirsty   Augmentin [Amoxicillin-Pot Clavulanate] Diarrhea   Avelox [Moxifloxacin Hcl In Nacl] Other (See Comments)    Tremors    Biaxin [Clarithromycin]     Unsure of reaction   Cedax [Ceftibuten] Other (See Comments)    tremors   Ciprofloxacin Other (See  Comments) and Nausea And Vomiting    Blurred vision Blurred vision Pt can't remember reaction   Clindamycin/Lincomycin     tachycardia   Levofloxacin Other (See Comments)    Feels dehydrated   Lincomycin Other (See Comments)    tachycardia   Montelukast Sodium Other (See Comments)    Numbness and tingling in hand. Numbness and tingling in hand.   Sulfonamide Derivatives Other (See Comments)    Gi upset   Sulfa Antibiotics Nausea Only    Other reaction(s): Other (See Comments) Gi upset  Other reaction(s): Other (See Comments) Gi upset Other reaction(s): Unknown   Past Medical History:  Diagnosis Date   Abdominal pain 01/16/2013   Acid reflux    Allergy    Arthritis    ARTHRITIS IN NECK BY DR. Alroy Dust [facility]   Asthma    Interstitial cystitis    Osteoporosis    PONV (postoperative nausea and vomiting)    Tinnitus    Trigeminal neuralgia    Atypical trigeminal neuralgia    Current Outpatient Medications:    acetaminophen (TYLENOL) 500 MG tablet, Take by mouth., Disp: , Rfl:    albuterol (VENTOLIN HFA) 108 (90 Base) MCG/ACT inhaler, TAKE 2 PUFFS BY MOUTH EVERY 6 HOURS AS NEEDED FOR WHEEZE OR SHORTNESS OF BREATH, Disp: 18 each, Rfl: 0   ascorbic acid (VITAMIN C) 1000 MG tablet, Take by mouth., Disp: , Rfl:    aspirin-acetaminophen-caffeine (EXCEDRIN MIGRAINE) 250-250-65 MG tablet, Take by  mouth., Disp: , Rfl:    b complex vitamins capsule, Take 1 capsule by mouth daily., Disp: , Rfl:    calcium citrate-vitamin D (CITRACAL+D) 315-200 MG-UNIT tablet, Take by mouth., Disp: , Rfl:    Cholecalciferol (VITAMIN D3) 125 MCG (5000 UT) CAPS, Take 5,000 Units by mouth daily., Disp: , Rfl:    Dermatological Products, Misc. (SUVICORT) EMUL, , Disp: , Rfl:    ferrous sulfate (FE TABS) 325 (65 FE) MG EC tablet, Take 1 tablet (325 mg total) by mouth daily with breakfast., Disp: 90 tablet, Rfl: 1   Fluticasone Furoate (ARNUITY ELLIPTA) 200 MCG/ACT AEPB, INHALE 1 PUFF BY MOUTH EVERY DAY,  Disp: 30 each, Rfl: 0   hydrOXYzine (ATARAX) 25 MG tablet, Take 25 mg by mouth at bedtime., Disp: , Rfl:    hyoscyamine (LEVSIN SL) 0.125 MG SL tablet, Place under the tongue every 4 (four) hours as needed., Disp: , Rfl:    lidocaine (XYLOCAINE) 2 % jelly, APPLY TOPICALLY TO AFFECTED AREA EVERY DAY AS NEEDED (use sparingly), Disp: 85 g, Rfl: 0   Lidocaine-Collagen-Aloe Vera (REGENECARE) 2 % GEL, Apply topically daily., Disp: 85 g, Rfl: 2   melatonin 5 MG TABS, Take 5 mg by mouth., Disp: , Rfl:    Meth-Hyo-M Bl-Na Phos-Ph Sal (URO-MP) 118 MG CAPS, Take 1 capsule by mouth 4 (four) times daily as needed., Disp: , Rfl:    mirtazapine (REMERON) 7.5 MG tablet, Take 7.5 mg by mouth at bedtime., Disp: , Rfl:    olopatadine (PATANOL) 0.1 % ophthalmic solution, Place 1 drop into both eyes 2 (two) times daily as needed for allergies., Disp: 5 mL, Rfl: 5   Olopatadine-Mometasone (RYALTRIS) 665-25 MCG/ACT SUSP, Place 1-2 sprays into the nose in the morning and at bedtime., Disp: 29 g, Rfl: 5   Polyethylene Glycol 3350 (MIRALAX PO), Take by mouth as needed., Disp: , Rfl:    Probiotic Product (PROBIOTIC PO), Take by mouth daily., Disp: , Rfl:    sennosides-docusate sodium (SENOKOT-S) 8.6-50 MG tablet, Take 1 tablet by mouth daily., Disp: 30 tablet, Rfl: 0   Simethicone (GAS-X PO), Take by mouth., Disp: , Rfl:    tizanidine (ZANAFLEX) 2 MG capsule, Take 1-2 capsules (2-4 mg total) by mouth 3 (three) times daily as needed for muscle spasms., Disp: 60 capsule, Rfl: 2   traMADol (ULTRAM) 50 MG tablet, Take 1 tablet (50 mg total) by mouth every 12 (twelve) hours as needed., Disp: 30 tablet, Rfl: 1   UNABLE TO FIND, Take 1 capsule by mouth daily. Dv3- vitamin D 3 plus immune support, Disp: , Rfl:   Current Facility-Administered Medications:    zoledronic acid (RECLAST) injection 5 mg, 5 mg, Intravenous, Once, Takyra Cantrall M, DO Social History   Socioeconomic History   Marital status: Married    Spouse name:  [name]   Number of children: 3   Years of education: 12   Highest education level: Some college, no degree  Occupational History   Occupation: CNA    Comment: Retired  Tobacco Use   Smoking status: Never   Smokeless tobacco: Never  Vaping Use   Vaping Use: Never used  Substance and Sexual Activity   Alcohol use: No    Alcohol/week: 0.0 standard drinks of alcohol    Comment: 01-19-2016 per pt no   Drug use: No    Comment: 01-19-2016 per pt no    Sexual activity: Not Currently    Birth control/protection: Surgical  Other Topics Concern   Not  on file  Social History Narrative   Lives with husband.    Social Determinants of Health   Financial Resource Strain: Low Risk  (09/13/2019)   Overall Financial Resource Strain (CARDIA)    Difficulty of Paying Living Expenses: Not hard at all  Food Insecurity: No Food Insecurity (09/13/2019)   Hunger Vital Sign    Worried About Running Out of Food in the Last Year: Never true    Ran Out of Food in the Last Year: Never true  Transportation Needs: Unmet Transportation Needs (03/08/2021)   PRAPARE - Transportation    Lack of Transportation (Medical): No    Lack of Transportation (Non-Medical): Yes  Physical Activity: Inactive (05/03/2021)   Exercise Vital Sign    Days of Exercise per Week: 0 days    Minutes of Exercise per Session: 0 min  Stress: Stress Concern Present (05/03/2021)   [place]    Feeling of Stress : Rather much  Social Connections: Socially Integrated (09/13/2019)   Social Connection and Isolation Panel [NHANES]    Frequency of Communication with Friends and Family: More than three times a week    Frequency of Social Gatherings with Friends and Family: More than three times a week    Attends Religious Services: More than 4 times per year    Active Member of [employer] or Organizations: Yes    Attends Music therapist: More than 4 times per year     Marital Status: Married  Human resources officer Violence: Not At Risk (09/13/2019)   Humiliation, Afraid, Rape, and Kick questionnaire    Fear of Current or Ex-Partner: No    Emotionally Abused: No    Physically Abused: No    Sexually Abused: No   Family History  Problem Relation Age of Onset   Hyperlipidemia Mother    Arthritis Mother    Cancer Mother    Lymphoma Mother    Cancer Father    Allergies Father    Allergies Sister    Allergies Sister    Allergic rhinitis Neg Hx    Angioedema Neg Hx    Asthma Neg Hx    Eczema Neg Hx    Immunodeficiency Neg Hx    Urticaria Neg Hx     Objective: Office vital signs reviewed. BP 114/71   Pulse 68   Temp 97.6 F (36.4 C)   Ht _0  (1.702 m)   Wt 115 lb (52.2 kg)   SpO2 99%   BMI 18.01 kg/m   Physical Examination:  General: Awake, alert, malnourished, No acute distress HEENT: sclera white, no conjunctival pallor, TMs intact bilaterally.  She has clear mucus in the left nare.  Clearing her throat several times throughout the exam.  Oral pillars with some slight erythema but no ulceration or exudates Cardio: regular rate and rhythm, S1S2 heard, no murmurs appreciated Pulm: clear to auscultation bilaterally, no wheezes, rhonchi or rales; normal work of breathing on room air    Assessment/ Plan: 71 y.o. female   Leukopenia, unspecified type - Plan: Pathologist smear review, Ambulatory referral to Hematology / Oncology, Lactate Dehydrogenase  Anemia, unspecified type - Plan: Pathologist smear review, Ambulatory referral to Hematology / Oncology, Lactate Dehydrogenase  Osteoporosis of multiple sites - Plan: VITAMIN D 25 Hydroxy (Vit-D Deficiency, Fractures), CMP14+EGFR  Nonallergic rhinitis  Going to order additional labs to further investigate this leukopenia and anemia that do not appear to be reflective of iron deficiency or other deficiency at  this time despite elevation in MCV.  Check LDH, pathologist smear review and repeat  CBC.  Referral to hematology oncology placed.  Given drop in hemoglobin and previous GI bleeding I will CC to her gastroenterology office at [place] (Fax)    Vitamin D and CMP collected as she is going to reschedule her Reclast ASAP  I have encouraged her to increase her nasal sprays to 2 sprays in each nostril twice daily as directed by her allergist keep follow-up with her specialist as directed.  No evidence of bacterial infection on exam  Orders Placed This Encounter  Procedures   Pathologist smear review   VITAMIN D 25 Hydroxy (Vit-D Deficiency, Fractures)   CMP14+EGFR   Lactate Dehydrogenase   Ambulatory referral to Hematology / Oncology    Referral Priority:   Routine    Referral Type:   Consultation    Referral Reason:   Specialty Services Required    Requested Specialty:   Oncology    Number of Visits Requested:   1   No orders of the defined types were placed in this encounter.    [name], DO [place] [phone]

## 2022-02-23 NOTE — Patient Instructions (Signed)
Referred to Dr Irene Limbo at [facility]

## 2022-02-24 LAB — CMP14+EGFR
ALT: 12 IU/L (ref 0–32)
AST: 22 IU/L (ref 0–40)
Albumin/Globulin Ratio: 2.1 (ref 1.2–2.2)
Albumin: 4.4 g/dL (ref 3.8–4.8)
Alkaline Phosphatase: 54 IU/L (ref 44–121)
BUN/Creatinine Ratio: 15 (ref 12–28)
BUN: 12 mg/dL (ref 8–27)
Bilirubin Total: 0.3 mg/dL (ref 0.0–1.2)
CO2: 23 mmol/L (ref 20–29)
Calcium: 9.5 mg/dL (ref 8.7–10.3)
Chloride: 103 mmol/L (ref 96–106)
Creatinine, Ser: 0.8 mg/dL (ref 0.57–1.00)
Globulin, Total: 2.1 g/dL (ref 1.5–4.5)
Glucose: 92 mg/dL (ref 70–99)
Potassium: 4.1 mmol/L (ref 3.5–5.2)
Sodium: 138 mmol/L (ref 134–144)
Total Protein: 6.5 g/dL (ref 6.0–8.5)
eGFR: 79 mL/min/{1.73_m2} (ref >59)

## 2022-02-24 LAB — PATHOLOGIST SMEAR REVIEW
Basophils Absolute: 0 10*3/uL (ref 0.0–0.2)
Basos: 1 %
EOS (ABSOLUTE): 0.2 10*3/uL (ref 0.0–0.4)
Eos: 5 %
Hematocrit: 34.6 % (ref 34.0–46.6)
Hemoglobin: 11.1 g/dL (ref 11.1–15.9)
Immature Grans (Abs): 0 10*3/uL (ref 0.0–0.1)
Immature Granulocytes: 0 %
Lymphocytes Absolute: 0.7 10*3/uL (ref 0.7–3.1)
Lymphs: 19 %
MCH: 30.7 pg (ref 26.6–33.0)
MCHC: 32.1 g/dL (ref 31.5–35.7)
MCV: 96 fL (ref 79–97)
Monocytes Absolute: 0.4 10*3/uL (ref 0.1–0.9)
Monocytes: 9 %
Neutrophils Absolute: 2.5 10*3/uL (ref 1.4–7.0)
Neutrophils: 66 %
Platelets: 185 10*3/uL (ref 150–450)
RBC: 3.61 x10E6/uL — ABNORMAL LOW (ref 3.77–5.28)
RDW: 10.9 % — ABNORMAL LOW (ref 11.7–15.4)
WBC: 3.8 10*3/uL (ref 3.4–10.8)

## 2022-02-24 LAB — LACTATE DEHYDROGENASE: LDH: 154 IU/L (ref 119–226)

## 2022-02-24 LAB — VITAMIN D 25 HYDROXY (VIT D DEFICIENCY, FRACTURES): Vit D, 25-Hydroxy: 59.6 ng/mL (ref 30.0–100.0)

## 2022-02-25 ENCOUNTER — Telehealth: Payer: Self-pay | Admitting: Hematology

## 2022-02-25 NOTE — Telephone Encounter (Signed)
Spoke with patient confirming new patient appointment 12/26

## 2022-02-28 NOTE — Progress Notes (Deleted)
Follow Up Note  RE: Kristy Garza MRN: [id number] DOB: 1950-09-10 Date of Office Visit: 03/01/2022  Referring provider: [name], DO Primary care provider: [name], DO  Chief Complaint: No chief complaint on file.  History of Present Illness: I had the pleasure of seeing Kristy Garza for a follow up visit at the [facility] [place] on 02/28/2022. She is a 71 y.o. female, who is being followed for asthma, chronic rhinitis and recurrent infections. Her previous allergy office visit was on 01/28/2022 with Gareth Morgan, FNP via telemedicine. Today is a regular follow up visit.  flutter valve? pulm referral?  Asthma Continue Arnuity 200 to 1 puff once a day to prevent cough and wheeze. For the next few days, use albuterol about 10 minutes before you take your Arnuity dose Continue albuterol 2 puffs once every 4 hours as needed for cough or wheeze You may use albuterol 2 puffs 5 to 15 minutes before activity to decrease cough or wheeze   Chronic rhinitis Continue Rylatris 2 sprays in each nostril twice a day as needed for nasal symptoms.  Consider saline nasal rinses as needed for nasal symptoms. Use this before any medicated nasal sprays for best result Try to stop Allegra for the next few days. Then you may take Allegra once a day as needed for a runny nose or itch Begin Mucinex [phone] mg twice a day and increase fluid intake to thin out mucus   Recurrent infections Keep track of infections, antibiotic and steroid use   Call the clinic if this treatment plan is not working well for you    Possible sinusitis Persistent headaches, PND, coughing, teeth pain x 1 week. No benefit with depo. History of TMJ. No recent antibiotics. Possible sinus infection. Take doxycycline '100mg'$  twice a day for 10 days. If you still having teeth pain recommend dental evaluation next.    Moderate persistent asthma without complication Past history - Breo caused thrush.  2022 spirometry showed: normal pattern with 7% improvement in FEV1 post bronchodilator treatment. Clinically feeling much better. Covid-19 in Nov 2022. Interim history - switched to Arnuity as Symbicort causes some shaking. Recently using albuterol once a day.  Daily controller medication(s): continue Arnuity 265mg 1 puff once a day and rinse mouth after each use.  If your asthma is not doing well then let [place].  During upper respiratory infections/asthma flares:  Start budesonide 0.'5mg'$  nebulizer twice a day for 1-2 weeks until your breathing symptoms return to baseline.  Pretreat with albuterol 2 puffs or albuterol nebulizer.  If you need to use your albuterol nebulizer machine back to back within 15-30 minutes with no relief then please go to the ER/urgent care for further evaluation.  May use albuterol rescue inhaler 2 puffs or nebulizer every 4 to 6 hours as needed for shortness of breath, chest tightness, coughing, and wheezing. May use albuterol rescue inhaler 2 puffs 5 to 15 minutes prior to strenuous physical activities. Monitor frequency of use.  Get spirometry at next visit.   Recurrent infections Interim history - bloodwork (Immunoglobulin levels, pneumococcal titer, tetanus/diptheria titer) normal. Keep track of infections and antibiotics use.   Nonallergic rhinitis Past history - 2022 bloodwork negative to environmental allergy panel. ENT evaluation unremarkable (patient declined laryngoscopy). 2023 skin testing negative.  Interim history - still having sinus issues. Discussed with patient that she can have these symptoms and not have environmental allergies.  Start Ryaltris (olopatadine + mometasone nasal spray combination)  1-2 sprays per nostril twice a day. Sample given. This replaces your other nasal sprays. If this works well for you, then have [facility] ship the medication to your home - prescription already sent in.  Nasal saline spray (i.e., Simply Saline) or nasal saline  lavage (i.e., NeilMed) is recommended as needed and prior to medicated nasal sprays.  Assessment and Plan: Kristy Garza is a 71 y.o. female with: No problem-specific Assessment & Plan notes found for this encounter.  No follow-ups on file.  No orders of the defined types were placed in this encounter.  Lab Orders  No laboratory test(s) ordered today    Diagnostics: Spirometry:  Tracings reviewed. Her effort: {Blank single:19197::"Good reproducible efforts.","It was hard to get consistent efforts and there is a question as to whether this reflects a maximal maneuver.","Poor effort, data can not be interpreted."} FVC: ***L FEV1: ***L, ***% predicted FEV1/FVC ratio: ***% Interpretation: {Blank single:19197::"Spirometry consistent with mild obstructive disease","Spirometry consistent with moderate obstructive disease","Spirometry consistent with severe obstructive disease","Spirometry consistent with possible restrictive disease","Spirometry consistent with mixed obstructive and restrictive disease","Spirometry uninterpretable due to technique","Spirometry consistent with normal pattern","No overt abnormalities noted given today's efforts"}.  Please see scanned spirometry results for details.  Skin Testing: {Blank single:19197::"Select foods","Environmental allergy panel","Environmental allergy panel and select foods","Food allergy panel","None","Deferred due to recent antihistamines use"}. *** Results discussed with patient/family.   Medication List:  Current Outpatient Medications  Medication Sig Dispense Refill   acetaminophen (TYLENOL) 500 MG tablet Take by mouth.     albuterol (VENTOLIN HFA) 108 (90 Base) MCG/ACT inhaler TAKE 2 PUFFS BY MOUTH EVERY 6 HOURS AS NEEDED FOR WHEEZE OR SHORTNESS OF BREATH 18 each 0   ascorbic acid (VITAMIN C) 1000 MG tablet Take by mouth.     aspirin-acetaminophen-caffeine (EXCEDRIN MIGRAINE) 250-250-65 MG tablet Take by mouth.     b complex vitamins capsule  Take 1 capsule by mouth daily.     calcium citrate-vitamin D (CITRACAL+D) 315-200 MG-UNIT tablet Take by mouth.     Cholecalciferol (VITAMIN D3) 125 MCG (5000 UT) CAPS Take 5,000 Units by mouth daily.     Dermatological Products, Misc. (SUVICORT) EMUL      ferrous sulfate (FE TABS) 325 (65 FE) MG EC tablet Take 1 tablet (325 mg total) by mouth daily with breakfast. 90 tablet 1   Fluticasone Furoate (ARNUITY ELLIPTA) 200 MCG/ACT AEPB INHALE 1 PUFF BY MOUTH EVERY DAY 30 each 0   hydrOXYzine (ATARAX) 25 MG tablet Take 25 mg by mouth at bedtime.     hyoscyamine (LEVSIN SL) 0.125 MG SL tablet Place under the tongue every 4 (four) hours as needed.     lidocaine (XYLOCAINE) 2 % jelly APPLY TOPICALLY TO AFFECTED AREA EVERY DAY AS NEEDED (use sparingly) 85 g 0   Lidocaine-Collagen-Aloe Vera (REGENECARE) 2 % GEL Apply topically daily. 85 g 2   melatonin 5 MG TABS Take 5 mg by mouth.     Meth-Hyo-M Bl-Na Phos-Ph Sal (URO-MP) 118 MG CAPS Take 1 capsule by mouth 4 (four) times daily as needed.     mirtazapine (REMERON) 7.5 MG tablet Take 7.5 mg by mouth at bedtime.     olopatadine (PATANOL) 0.1 % ophthalmic solution Place 1 drop into both eyes 2 (two) times daily as needed for allergies. 5 mL 5   Olopatadine-Mometasone (RYALTRIS) 665-25 MCG/ACT SUSP Place 1-2 sprays into the nose in the morning and at bedtime. 29 g 5   Polyethylene Glycol 3350 (MIRALAX PO) Take by mouth as needed.  Probiotic Product (PROBIOTIC PO) Take by mouth daily.     sennosides-docusate sodium (SENOKOT-S) 8.6-50 MG tablet Take 1 tablet by mouth daily. 30 tablet 0   Simethicone (GAS-X PO) Take by mouth.     tizanidine (ZANAFLEX) 2 MG capsule Take 1-2 capsules (2-4 mg total) by mouth 3 (three) times daily as needed for muscle spasms. 60 capsule 2   traMADol (ULTRAM) 50 MG tablet Take 1 tablet (50 mg total) by mouth every 12 (twelve) hours as needed. 30 tablet 1   UNABLE TO FIND Take 1 capsule by mouth daily. Dv3- vitamin D 3 plus  immune support     Current Facility-Administered Medications  Medication Dose Route Frequency Provider Last Rate Last Admin   zoledronic acid (RECLAST) injection 5 mg  5 mg Intravenous Once Ronnie Doss M, DO       Allergies: Allergies  Allergen Reactions   Cefuroxime Axetil Other (See Comments)    Extreme gas   Gabapentin Other (See Comments)    Constipation Constipation   Montelukast Other (See Comments)    Numbness and tingling in hand. Numbness and tingling in hand. Numbness and tingling in hand. NUMBNESS OF HANDS AND FEET. Numbness in hands and feet NUMBNESS OF HANDS AND FEET.   Pregabalin Other (See Comments)    Drowsiness, dry mouth Drowsiness, dry mouth Makes her tired and thirsty   Augmentin [Amoxicillin-Pot Clavulanate] Diarrhea   Avelox [Moxifloxacin Hcl In Nacl] Other (See Comments)    Tremors    Biaxin [Clarithromycin]     Unsure of reaction   Cedax [Ceftibuten] Other (See Comments)    tremors   Ciprofloxacin Other (See Comments) and Nausea And Vomiting    Blurred vision Blurred vision Pt can't remember reaction   Clindamycin/Lincomycin     tachycardia   Levofloxacin Other (See Comments)    Feels dehydrated   Lincomycin Other (See Comments)    tachycardia   Montelukast Sodium Other (See Comments)    Numbness and tingling in hand. Numbness and tingling in hand.   Sulfonamide Derivatives Other (See Comments)    Gi upset   Sulfa Antibiotics Nausea Only    Other reaction(s): Other (See Comments) Gi upset  Other reaction(s): Other (See Comments) Gi upset Other reaction(s): Unknown   I reviewed her past medical history, social history, family history, and environmental history and no significant changes have been reported from her previous visit.  Review of Systems  Constitutional:  Negative for appetite change, chills, fever and unexpected weight change.  HENT:  Positive for postnasal drip and rhinorrhea. Negative for congestion.   Eyes:   Negative for itching.  Respiratory:  Positive for cough. Negative for chest tightness, shortness of breath and wheezing.   Gastrointestinal:  Negative for abdominal pain.  Skin:  Negative for rash.  Allergic/Immunologic: Negative for environmental allergies and food allergies.  Neurological:  Positive for headaches.    Objective: There were no vitals taken for this visit. There is no height or weight on file to calculate BMI. Physical Exam Vitals and nursing note reviewed.  Constitutional:      Appearance: Normal appearance. She is well-developed.  HENT:     Head: Normocephalic and atraumatic.     Right Ear: External ear normal.     Left Ear: External ear normal.     Nose: Nose normal.     Mouth/Throat:     Mouth: Mucous membranes are moist.     Pharynx: Oropharynx is clear.  Eyes:     Conjunctiva/sclera:  Conjunctivae normal.  Cardiovascular:     Rate and Rhythm: Normal rate and regular rhythm.     Heart sounds: Normal heart sounds. No murmur heard. Pulmonary:     Effort: Pulmonary effort is normal.     Breath sounds: Normal breath sounds. No wheezing, rhonchi or rales.  Musculoskeletal:     Cervical back: Neck supple.  Skin:    General: Skin is warm.     Findings: No rash.  Neurological:     Mental Status: She is alert and oriented to person, place, and time.  Psychiatric:        Behavior: Behavior normal.    Previous notes and tests were reviewed. The plan was reviewed with the patient/family, and all questions/concerned were addressed.  It was my pleasure to see Brittaney today and participate in her care. Please feel free to contact me with any questions or concerns.  Sincerely,  Rexene Alberts, DO Allergy & Immunology  [facility] office: [place] office: [phone]

## 2022-03-01 ENCOUNTER — Ambulatory Visit: Payer: Medicare HMO | Admitting: Allergy

## 2022-03-07 DIAGNOSIS — M5137 Other intervertebral disc degeneration, lumbosacral region: Secondary | ICD-10-CM | POA: Diagnosis not present

## 2022-03-07 DIAGNOSIS — M9903 Segmental and somatic dysfunction of lumbar region: Secondary | ICD-10-CM | POA: Diagnosis not present

## 2022-03-07 DIAGNOSIS — M9901 Segmental and somatic dysfunction of cervical region: Secondary | ICD-10-CM | POA: Diagnosis not present

## 2022-03-07 DIAGNOSIS — M9902 Segmental and somatic dysfunction of thoracic region: Secondary | ICD-10-CM | POA: Diagnosis not present

## 2022-03-14 ENCOUNTER — Telehealth: Payer: Self-pay | Admitting: Family Medicine

## 2022-03-15 NOTE — Telephone Encounter (Signed)
Can you help with this?

## 2022-03-16 NOTE — Telephone Encounter (Signed)
I spoke to pt and she did verify it is for her reclast.

## 2022-03-16 NOTE — Telephone Encounter (Signed)
Are you referring to reclast?

## 2022-03-16 NOTE — Telephone Encounter (Signed)
That's what I was referring to.  If she is getting a true transfusion with Heme, then I'm sure they can accommodate that as well.  I will cc pools to double check what infusion pt is referring to.

## 2022-03-17 ENCOUNTER — Other Ambulatory Visit: Payer: Self-pay | Admitting: Family Medicine

## 2022-03-17 NOTE — Telephone Encounter (Signed)
I have contacted patient and the infusion center at [place]. I have left a message with both locations for them to call me back to see if we can get patient scheduled. It looks like order is in the system.

## 2022-03-18 NOTE — Telephone Encounter (Signed)
Orders faxed to [facility] and [facility]. Patient needs to have completed by 12/29. Patient aware that is she has not heard anything by 12/26 to please call and speak to [place].

## 2022-03-22 ENCOUNTER — Inpatient Hospital Stay: Payer: Medicare HMO | Attending: Hematology | Admitting: Hematology

## 2022-03-22 ENCOUNTER — Inpatient Hospital Stay: Payer: Medicare HMO

## 2022-03-22 VITALS — BP 110/70 | HR 80 | Temp 97.7°F | Resp 16 | Ht 67.0 in | Wt 115.4 lb

## 2022-03-22 DIAGNOSIS — D72819 Decreased white blood cell count, unspecified: Secondary | ICD-10-CM

## 2022-03-22 DIAGNOSIS — D649 Anemia, unspecified: Secondary | ICD-10-CM

## 2022-03-22 DIAGNOSIS — M810 Age-related osteoporosis without current pathological fracture: Secondary | ICD-10-CM | POA: Diagnosis not present

## 2022-03-22 LAB — CBC WITH DIFFERENTIAL (CANCER CENTER ONLY)
Abs Immature Granulocytes: 0.01 10*3/uL (ref 0.00–0.07)
Basophils Absolute: 0 10*3/uL (ref 0.0–0.1)
Basophils Relative: 1 %
Eosinophils Absolute: 0.2 10*3/uL (ref 0.0–0.5)
Eosinophils Relative: 5 %
HCT: 36 % (ref 36.0–46.0)
Hemoglobin: 11.9 g/dL — ABNORMAL LOW (ref 12.0–15.0)
Immature Granulocytes: 0 %
Lymphocytes Relative: 24 %
Lymphs Abs: 1 10*3/uL (ref 0.7–4.0)
MCH: 31.9 pg (ref 26.0–34.0)
MCHC: 33.1 g/dL (ref 30.0–36.0)
MCV: 96.5 fL (ref 80.0–100.0)
Monocytes Absolute: 0.4 10*3/uL (ref 0.1–1.0)
Monocytes Relative: 9 %
Neutro Abs: 2.5 10*3/uL (ref 1.7–7.7)
Neutrophils Relative %: 61 %
Platelet Count: 209 10*3/uL (ref 150–400)
RBC: 3.73 MIL/uL — ABNORMAL LOW (ref 3.87–5.11)
RDW: 11.5 % (ref 11.5–15.5)
WBC Count: 4.1 10*3/uL (ref 4.0–10.5)
nRBC: 0 % (ref 0.0–0.2)

## 2022-03-22 LAB — IRON AND IRON BINDING CAPACITY (CC-WL,HP ONLY)
Iron: 75 ug/dL (ref 28–170)
Saturation Ratios: 21 % (ref 10.4–31.8)
TIBC: 353 ug/dL (ref 250–450)
UIBC: 278 ug/dL

## 2022-03-22 LAB — LACTATE DEHYDROGENASE: LDH: 138 U/L (ref 98–192)

## 2022-03-22 NOTE — Progress Notes (Addendum)
[name]   HEMATOLOGY/ONCOLOGY CONSULTATION NOTE  Date of Service: 03/22/2022  Patient Care Team: [name], DO as PCP - General (Family Medicine) Deneise Lever, MD as Consulting Physician (Pulmonary Disease) Minus Breeding, MD as Consulting Physician (Cardiology) Shea Evans [name], LCSW as Social Worker (Licensed Clinical Social Worker)  CHIEF COMPLAINTS/PURPOSE OF CONSULTATION:  Leucopenia and Anemia  HISTORY OF PRESENTING ILLNESS:   Kristy Garza is a wonderful 71 y.o. female who has been referred to [name] by Dr .[name], [name], DO  for evaluation and management of Leukopenia and anemia  Patient has a history of osteoporosis, interstitial cystitis, acid reflux, osteoarthritis, trigeminal neuralgia and had routine labs with the primary care physician on 02/04/2022 which showed mild leukopenia with a WBC count of 3.3k with a neutrophil count of 2.1k and a normal differential.  No significant increase in immature granulocytes.  Also noted to have mild anemia with a hemoglobin of 10.4 with an MCV of 98.  Platelets normal at 221k. Vitamin B12 levels were noted to be normal at 451 iron saturation was 19% with a ferritin of 33 folate within normal limits.  Patient notes she was started on oral iron. She notes no issues with acute infections.  No recent use of antibiotics.  No new medications.  No use of excessive NSAIDs or other over-the-counter supplements or herbs. No unexpected weight loss, fevers chills night sweats or recent overt viral infections.  She reported labs with her primary care physician on 02/23/2022 and was noted to have improvement in hemoglobin to 11.1 and WBC count improved to 3.8k.  She was referred to [name] to evaluate for mild anemia and leukopenia.  Patient notes no other acute new focal symptoms. Denies alcohol use, smoking or other drug use.  MEDICAL HISTORY:  Past Medical History:  Diagnosis Date   Abdominal pain 01/16/2013   Acid reflux     Allergy    Arthritis    ARTHRITIS IN NECK BY DR. Alroy Dust [facility]   Asthma    Interstitial cystitis    Osteoporosis    PONV (postoperative nausea and vomiting)    Tinnitus    Trigeminal neuralgia    Atypical trigeminal neuralgia    SURGICAL HISTORY: Past Surgical History:  Procedure Laterality Date   ABDOMINAL HYSTERECTOMY  2000   CYSTOSCOPY WITH HYDRODISTENSION AND BIOPSY N/A 06/11/2012   Procedure: CYSTOSCOPY/BIOPSY/HYDRODISTENSION with Instillation of Pyridium and Marcaine and Kenalog;  Surgeon: Ailene Rud, MD;  Location: [facility];  Service: Urology;  Laterality: N/A;  1 hour requested for this case  BLADDER BIOPSY   ETHMOIDECTOMY  2012   SEPTOPLASTY  1980's   vocal cord surgery   1990's   polyp removal    SOCIAL HISTORY: Social History   Socioeconomic History   Marital status: Married    Spouse name: [name]   Number of children: 3   Years of education: 12   Highest education level: Some college, no degree  Occupational History   Occupation: CNA    Comment: Retired  Tobacco Use   Smoking status: Never   Smokeless tobacco: Never  Vaping Use   Vaping Use: Never used  Substance and Sexual Activity   Alcohol use: No    Alcohol/week: 0.0 standard drinks of alcohol    Comment: 01-19-2016 per pt no   Drug use: No    Comment: 01-19-2016 per pt no    Sexual activity: Not Currently    Birth control/protection: Surgical  Other Topics Concern  Not on file  Social History Narrative   Lives with husband.    Social Determinants of Health   Financial Resource Strain: Low Risk  (09/13/2019)   Overall Financial Resource Strain (CARDIA)    Difficulty of Paying Living Expenses: Not hard at all  Food Insecurity: No Food Insecurity (09/13/2019)   Hunger Vital Sign    Worried About Running Out of Food in the Last Year: Never true    Ran Out of Food in the Last Year: Never true  Transportation Needs: Unmet Transportation Needs (03/08/2021)    PRAPARE - Transportation    Lack of Transportation (Medical): No    Lack of Transportation (Non-Medical): Yes  Physical Activity: Inactive (05/03/2021)   Exercise Vital Sign    Days of Exercise per Week: 0 days    Minutes of Exercise per Session: 0 min  Stress: Stress Concern Present (05/03/2021)   [place]    Feeling of Stress : Rather much  Social Connections: Socially Integrated (09/13/2019)   Social Connection and Isolation Panel [NHANES]    Frequency of Communication with Friends and Family: More than three times a week    Frequency of Social Gatherings with Friends and Family: More than three times a week    Attends Religious Services: More than 4 times per year    Active Member of [employer] or Organizations: Yes    Attends Music therapist: More than 4 times per year    Marital Status: Married  Human resources officer Violence: Not At Risk (09/13/2019)   Humiliation, Afraid, Rape, and Kick questionnaire    Fear of Current or Ex-Partner: No    Emotionally Abused: No    Physically Abused: No    Sexually Abused: No    FAMILY HISTORY: Family History  Problem Relation Age of Onset   Hyperlipidemia Mother    Arthritis Mother    Cancer Mother    Lymphoma Mother    Cancer Father    Allergies Father    Allergies Sister    Allergies Sister    Allergic rhinitis Neg Hx    Angioedema Neg Hx    Asthma Neg Hx    Eczema Neg Hx    Immunodeficiency Neg Hx    Urticaria Neg Hx     ALLERGIES:  is allergic to cefuroxime axetil, gabapentin, montelukast, pregabalin, augmentin [amoxicillin-pot clavulanate], avelox [moxifloxacin hcl in nacl], biaxin [clarithromycin], cedax [ceftibuten], ciprofloxacin, clindamycin/lincomycin, levofloxacin, lincomycin, montelukast sodium, sulfonamide derivatives, and sulfa antibiotics.  MEDICATIONS:  Current Outpatient Medications  Medication Sig Dispense Refill   acetaminophen (TYLENOL) 500  MG tablet Take by mouth.     albuterol (VENTOLIN HFA) 108 (90 Base) MCG/ACT inhaler TAKE 2 PUFFS BY MOUTH EVERY 6 HOURS AS NEEDED FOR WHEEZE OR SHORTNESS OF BREATH 18 each 0   ascorbic acid (VITAMIN C) 1000 MG tablet Take by mouth.     aspirin-acetaminophen-caffeine (EXCEDRIN MIGRAINE) 250-250-65 MG tablet Take by mouth.     b complex vitamins capsule Take 1 capsule by mouth daily.     calcium citrate-vitamin D (CITRACAL+D) 315-200 MG-UNIT tablet Take by mouth.     Cholecalciferol (VITAMIN D3) 125 MCG (5000 UT) CAPS Take 5,000 Units by mouth daily.     Dermatological Products, Misc. (SUVICORT) EMUL      ferrous sulfate (FE TABS) 325 (65 FE) MG EC tablet Take 1 tablet (325 mg total) by mouth daily with breakfast. 90 tablet 1   Fluticasone Furoate (ARNUITY ELLIPTA) 200 MCG/ACT  AEPB INHALE 1 PUFF BY MOUTH EVERY DAY 30 each 0   hydrOXYzine (ATARAX) 25 MG tablet Take 25 mg by mouth at bedtime.     hyoscyamine (LEVSIN SL) 0.125 MG SL tablet Place under the tongue every 4 (four) hours as needed.     lidocaine (XYLOCAINE) 2 % jelly APPLY TOPICALLY TO AFFECTED AREA EVERY DAY AS NEEDED (use sparingly) 85 g 0   Lidocaine-Collagen-Aloe Vera (REGENECARE) 2 % GEL Apply topically daily. 85 g 2   melatonin 5 MG TABS Take 5 mg by mouth.     Meth-Hyo-M Bl-Na Phos-Ph Sal (URO-MP) 118 MG CAPS Take 1 capsule by mouth 4 (four) times daily as needed.     mirtazapine (REMERON) 7.5 MG tablet Take 7.5 mg by mouth at bedtime.     olopatadine (PATANOL) 0.1 % ophthalmic solution Place 1 drop into both eyes 2 (two) times daily as needed for allergies. 5 mL 5   Olopatadine-Mometasone (RYALTRIS) 665-25 MCG/ACT SUSP Place 1-2 sprays into the nose in the morning and at bedtime. 29 g 5   Polyethylene Glycol 3350 (MIRALAX PO) Take by mouth as needed.     Probiotic Product (PROBIOTIC PO) Take by mouth daily.     sennosides-docusate sodium (SENOKOT-S) 8.6-50 MG tablet Take 1 tablet by mouth daily. 30 tablet 0   Simethicone (GAS-X  PO) Take by mouth.     tizanidine (ZANAFLEX) 2 MG capsule Take 1-2 capsules (2-4 mg total) by mouth 3 (three) times daily as needed for muscle spasms. 60 capsule 2   traMADol (ULTRAM) 50 MG tablet Take 1 tablet (50 mg total) by mouth every 12 (twelve) hours as needed. 30 tablet 1   UNABLE TO FIND Take 1 capsule by mouth daily. Dv3- vitamin D 3 plus immune support     Current Facility-Administered Medications  Medication Dose Route Frequency Provider Last Rate Last Admin   zoledronic acid (RECLAST) injection 5 mg  5 mg Intravenous Once Ronnie Doss M, DO        REVIEW OF SYSTEMS:    10 Point review of Systems was done is negative except as noted above.  PHYSICAL EXAMINATION: ECOG PERFORMANCE STATUS: 1 - Symptomatic but completely ambulatory  . Vitals:   03/22/22 1439  BP: 110/70  Pulse: 80  Resp: 16  Temp: 97.7 F (36.5 C)  SpO2: 100%   Filed Weights   03/22/22 1439  Weight: 115 lb 6.4 oz (52.3 kg)   .Body mass index is 18.07 kg/m.  GENERAL:alert, in no acute distress and comfortable SKIN: no acute rashes, no significant lesions EYES: conjunctiva are pink and non-injected, sclera anicteric OROPHARYNX: MMM, no exudates, no oropharyngeal erythema or ulceration NECK: supple, no JVD LYMPH:  no palpable lymphadenopathy in the cervical, axillary or inguinal regions LUNGS: clear to auscultation b/l with normal respiratory effort HEART: regular rate & rhythm ABDOMEN:  normoactive bowel sounds , non tender, not distended. Extremity: no pedal edema PSYCH: alert & oriented x 3 with fluent speech NEURO: no focal motor/sensory deficits  LABORATORY DATA:  I have reviewed the data as listed  .    Latest Ref Rng & Units 03/22/2022    3:59 PM 02/23/2022   10:00 AM 02/04/2022    9:06 AM  CBC  WBC 4.0 - 10.5 K/uL 4.1  3.8  3.3   Hemoglobin 12.0 - 15.0 g/dL 11.9  11.1  10.4   Hematocrit 36.0 - 46.0 % 36.0  34.6  31.8   Platelets 150 - 400 K/uL 209  185  221     .     Latest Ref Rng & Units 02/23/2022    9:53 AM 01/04/2022    2:23 PM 12/13/2021   10:32 AM  CMP  Glucose 70 - 99 mg/dL 92  107  70   BUN 8 - 27 mg/dL '12  10  13   '$ Creatinine 0.57 - 1.00 mg/dL 0.80  0.73  0.83   Sodium 134 - 144 mmol/L 138  138  134   Potassium 3.5 - 5.2 mmol/L 4.1  4.4  4.2   Chloride 96 - 106 mmol/L 103  102  98   CO2 20 - 29 mmol/L '23  22  22   '$ Calcium 8.7 - 10.3 mg/dL 9.5  9.6  9.8   Total Protein 6.0 - 8.5 g/dL 6.5  7.1    Total Bilirubin 0.0 - 1.2 mg/dL 0.3  0.3    Alkaline Phos 44 - 121 IU/L 54  51    AST 0 - 40 IU/L 22  23    ALT 0 - 32 IU/L 12  15     . Lab Results  Component Value Date   LDH 138 03/22/2022   . Lab Results  Component Value Date   IRON 75 03/22/2022   TIBC 353 03/22/2022   IRONPCTSAT 21 03/22/2022   (Iron and TIBC)  Lab Results  Component Value Date   FERRITIN 27 03/22/2022     RADIOGRAPHIC STUDIES: I have personally reviewed the radiological images as listed and agreed with the findings in the report. No results found.  ASSESSMENT & PLAN:   71 year old female with no significant new symptoms with incidentally noted anemia and leukopenia on routine labs with PCP  #1 mild leukopenia with no neutropenia.  Patient's WBC count on 02/04/2022 was at 3.3k Labs done today show patient's WBC count has normalized at 4.1k and her differential is within normal limits. Peripheral blood smear shows no increased blasts or abnormal myeloid left shift, We discussed that the transient drop in WBC count could be spuriously due to labs being processed in a delayed fashion.  Could also suggest possible subclinical viral infection or vaccination effect if she had any vaccines. E93 and folic acid within normal limits LDH within normal limits and do not suggest overactive bone marrow Patient has no clinically palpable hepatosplenomegaly or lymphadenopathy.  #2 normocytic anemia improving with p.o. iron.  Hemoglobin has improved from 10.4 up to  11.9  Plan -All available lab results were discussed in detail with the patient -We discussed potential etiologies of her mild anemia and leukopenia and suggested that improvement in the counts is reassuring. -She has no new focal symptoms or clinical features such as lymphadenopathy or hepatosplenomegaly suggesting lymphoid or myeloid overt disorder. -Myeloma panel is currently pending -Assuming myeloma panel is within normal limits no additional hematologic workup will be recommended at this time. -She could continue p.o. iron and other vitamin replacement with her primary care physician. -Kindly reconsult [name] if there is a significant new cytopenia or any other significant blood count abnormalities. -Continue follow-up with PCP  The total time spent in the appointment was 45 minutes*.  All of the patient's questions were answered with apparent satisfaction. The patient knows to call the clinic with any problems, questions or concerns.   Sullivan Lone MD MS AAHIVMS [facility] Hematology/Oncology Physician [facility]  .*Total Encounter Time as defined by the Centers for Medicare and Medicaid Services includes, in addition to the  face-to-face time of a patient visit (documented in the note above) non-face-to-face time: obtaining and reviewing outside history, ordering and reviewing medications, tests or procedures, care coordination (communications with other health care professionals or caregivers) and documentation in the medical record.

## 2022-03-23 ENCOUNTER — Encounter (HOSPITAL_COMMUNITY)
Admission: RE | Admit: 2022-03-23 | Discharge: 2022-03-23 | Disposition: A | Discharge status: Home / Self Care | Payer: Medicare HMO | Source: Ambulatory Visit | Attending: Family Medicine | Admitting: Family Medicine

## 2022-03-23 VITALS — BP 120/68 | HR 61 | Temp 97.6°F | Resp 16

## 2022-03-23 DIAGNOSIS — M9902 Segmental and somatic dysfunction of thoracic region: Secondary | ICD-10-CM | POA: Diagnosis not present

## 2022-03-23 DIAGNOSIS — Z01812 Encounter for preprocedural laboratory examination: Secondary | ICD-10-CM | POA: Insufficient documentation

## 2022-03-23 DIAGNOSIS — M810 Age-related osteoporosis without current pathological fracture: Secondary | ICD-10-CM | POA: Insufficient documentation

## 2022-03-23 DIAGNOSIS — M5137 Other intervertebral disc degeneration, lumbosacral region: Secondary | ICD-10-CM | POA: Diagnosis not present

## 2022-03-23 DIAGNOSIS — M9903 Segmental and somatic dysfunction of lumbar region: Secondary | ICD-10-CM | POA: Diagnosis not present

## 2022-03-23 DIAGNOSIS — M9901 Segmental and somatic dysfunction of cervical region: Secondary | ICD-10-CM | POA: Diagnosis not present

## 2022-03-23 LAB — FERRITIN: Ferritin: 27 ng/mL (ref 11–307)

## 2022-03-23 MED ORDER — ZOLEDRONIC ACID 5 MG/100ML IV SOLN
5.0000 mg | Freq: Once | INTRAVENOUS | Status: AC
  Administered 2022-03-23: 5 mg via INTRAVENOUS
  Filled 2022-03-23: qty 100

## 2022-03-23 MED ORDER — SODIUM CHLORIDE 0.9 % IV SOLN: INTRAVENOUS | Status: DC

## 2022-03-23 MED ORDER — ACETAMINOPHEN 325 MG PO TABS
650.0000 mg | ORAL_TABLET | Freq: Once | ORAL | Status: AC
  Administered 2022-03-23: 650 mg via ORAL
  Filled 2022-03-23: qty 2

## 2022-03-23 MED ORDER — DIPHENHYDRAMINE HCL 25 MG PO CAPS
25.0000 mg | ORAL_CAPSULE | Freq: Once | ORAL | Status: AC
  Administered 2022-03-23: 25 mg via ORAL
  Filled 2022-03-23: qty 1

## 2022-03-23 NOTE — Progress Notes (Signed)
Diagnosis: Osteoporosis  Provider:  Ronnie Doss DO  Procedure: Infusion  IV Type: Peripheral, IV Location: L Antecubital  Reclast (Zolendronic Acid), Dose: 5 mg  Infusion Start Time: 1106  Infusion Stop Time: 2297  Post Infusion IV Care: Observation period completed and Peripheral IV Discontinued  Discharge: Condition: Good, Destination: Home . AVS provided to patient.   Performed by:  Hughie Closs, RN

## 2022-03-23 NOTE — Telephone Encounter (Signed)
Kristy Garza from [facility] infusion called and states patient is on the schedule today for infusion

## 2022-03-29 ENCOUNTER — Encounter: Payer: Self-pay | Admitting: Family Medicine

## 2022-03-29 LAB — MULTIPLE MYELOMA PANEL, SERUM
Albumin SerPl Elph-Mcnc: 4.2 g/dL (ref 2.9–4.4)
Albumin/Glob SerPl: 1.6 (ref 0.7–1.7)
Alpha 1: 0.2 g/dL (ref 0.0–0.4)
Alpha2 Glob SerPl Elph-Mcnc: 0.6 g/dL (ref 0.4–1.0)
B-Globulin SerPl Elph-Mcnc: 0.8 g/dL (ref 0.7–1.3)
Gamma Glob SerPl Elph-Mcnc: 1 g/dL (ref 0.4–1.8)
Globulin, Total: 2.8 g/dL (ref 2.2–3.9)
IgA: 116 mg/dL (ref 64–422)
IgG (Immunoglobin G), Serum: 985 mg/dL (ref 586–1602)
IgM (Immunoglobulin M), Srm: 89 mg/dL (ref 26–217)
Total Protein ELP: 7 g/dL (ref 6.0–8.5)

## 2022-04-04 DIAGNOSIS — R300 Dysuria: Secondary | ICD-10-CM | POA: Diagnosis not present

## 2022-04-06 DIAGNOSIS — N3010 Interstitial cystitis (chronic) without hematuria: Secondary | ICD-10-CM | POA: Diagnosis not present

## 2022-04-07 ENCOUNTER — Telehealth: Payer: Self-pay

## 2022-04-07 DIAGNOSIS — M9902 Segmental and somatic dysfunction of thoracic region: Secondary | ICD-10-CM | POA: Diagnosis not present

## 2022-04-07 DIAGNOSIS — M9901 Segmental and somatic dysfunction of cervical region: Secondary | ICD-10-CM | POA: Diagnosis not present

## 2022-04-07 DIAGNOSIS — M5137 Other intervertebral disc degeneration, lumbosacral region: Secondary | ICD-10-CM | POA: Diagnosis not present

## 2022-04-07 DIAGNOSIS — M9903 Segmental and somatic dysfunction of lumbar region: Secondary | ICD-10-CM | POA: Diagnosis not present

## 2022-04-07 NOTE — Telephone Encounter (Signed)
Kristy Garza Key: [id number] - PA Case ID: [id number] Need help? Call us at [phone] Outcome Approvedtoday Your request has been approved Drug Trulance '3MG'$  tablets Form [employer] Electronic PA Form [id number] NCPDP)  Pharmacy informed

## 2022-04-07 NOTE — Telephone Encounter (Signed)
CARTINA BROUSSEAU (Key: [id number]) PA Case ID #: [id number] Need Help? Call us at [phone] Status sent iconSent to Plan today Drug Trulance '3MG'$  tablets ePA cloud logo Form [employer] Electronic PA Form [phone] NCPDP)

## 2022-04-19 DIAGNOSIS — N3010 Interstitial cystitis (chronic) without hematuria: Secondary | ICD-10-CM | POA: Diagnosis not present

## 2022-04-20 DIAGNOSIS — K219 Gastro-esophageal reflux disease without esophagitis: Secondary | ICD-10-CM | POA: Diagnosis not present

## 2022-04-20 DIAGNOSIS — K5904 Chronic idiopathic constipation: Secondary | ICD-10-CM | POA: Diagnosis not present

## 2022-04-20 DIAGNOSIS — B700 Diphyllobothriasis: Secondary | ICD-10-CM | POA: Diagnosis not present

## 2022-04-20 DIAGNOSIS — A071 Giardiasis [lambliasis]: Secondary | ICD-10-CM | POA: Diagnosis not present

## 2022-04-22 ENCOUNTER — Ambulatory Visit: Payer: Medicare HMO | Admitting: Internal Medicine

## 2022-04-22 ENCOUNTER — Encounter: Payer: Self-pay | Admitting: Internal Medicine

## 2022-04-22 ENCOUNTER — Other Ambulatory Visit: Payer: Self-pay

## 2022-04-22 ENCOUNTER — Other Ambulatory Visit: Payer: Medicare HMO

## 2022-04-22 VITALS — BP 112/70 | HR 70 | Temp 98.3°F | Resp 18 | Wt 115.0 lb

## 2022-04-22 DIAGNOSIS — N3010 Interstitial cystitis (chronic) without hematuria: Secondary | ICD-10-CM | POA: Diagnosis not present

## 2022-04-22 DIAGNOSIS — R0982 Postnasal drip: Secondary | ICD-10-CM

## 2022-04-22 DIAGNOSIS — R053 Chronic cough: Secondary | ICD-10-CM | POA: Diagnosis not present

## 2022-04-22 DIAGNOSIS — R7989 Other specified abnormal findings of blood chemistry: Secondary | ICD-10-CM | POA: Diagnosis not present

## 2022-04-22 DIAGNOSIS — J310 Chronic rhinitis: Secondary | ICD-10-CM

## 2022-04-22 DIAGNOSIS — E0590 Thyrotoxicosis, unspecified without thyrotoxic crisis or storm: Secondary | ICD-10-CM | POA: Diagnosis not present

## 2022-04-22 DIAGNOSIS — J4540 Moderate persistent asthma, uncomplicated: Secondary | ICD-10-CM | POA: Diagnosis not present

## 2022-04-22 MED ORDER — ARNUITY ELLIPTA 200 MCG/ACT IN AEPB: 1.0000 | INHALATION_SPRAY | Freq: Every day | RESPIRATORY_TRACT | 5 refills | Status: DC

## 2022-04-22 MED ORDER — ALBUTEROL SULFATE HFA 108 (90 BASE) MCG/ACT IN AERS: INHALATION_SPRAY | RESPIRATORY_TRACT | 1 refills | Status: DC

## 2022-04-22 MED ORDER — RYALTRIS 665-25 MCG/ACT NA SUSP: 1.0000 | Freq: Two times a day (BID) | NASAL | 5 refills | Status: DC

## 2022-04-22 NOTE — Patient Instructions (Addendum)
Asthma - MDI technique discussed.   - Maintenance inhaler: Continue Arnuity 200 to 1 puff once a day to prevent cough and wheeze. Brush tongue and gargle afterwards.  - Rescue inhaler: Albuterol 2 puffs via spacer every 4-6 hours as needed for respiratory symptoms of cough, shortness of breath, or wheezing Asthma control goals:  Full participation in all desired activities (may need albuterol before activity) Albuterol use two times or less a week on average (not counting use with activity) Cough interfering with sleep two times or less a month Oral steroids no more than once a year No hospitalizations   Chronic rhinitis Chronic Cough with PND - Negative SPT 05/2021.  - Use nasal saline rinses before nose sprays such as with Neilmed Sinus Rinse.  Use distilled water.   - Continue Rylatris 2 sprays in each nostril twice a day as needed for nasal symptoms.  If you are unable to get it, then we can call in Flonase and Azelastine.  - Use Allegra '180mg'$  daily as needed.   - For itchy watery eyes, okay to use Olopatadine 1 eye drop daily as needed.  Do not use this for red eyes; use lubricating eye drops instead. - When you have a lot of mucous, you can use Mucinex '600mg'$  daily as needed.    Recurrent infections Keep track of infections, antibiotic and steroid use

## 2022-04-22 NOTE — Progress Notes (Signed)
FOLLOW UP Date of Service/Encounter:  04/22/22   Subjective:  Kristy Garza (DOB: [birth year]) is a 72 y.o. female who returns to the [facility] [place] on 04/22/2022 for follow up for moderate persistent asthma, chronic rhinitis and recurrent infections.   History obtained from: chart review and patient. Last visit was with Gareth Morgan 01/28/2022 and her asthma at the time was doing well but did have some viral respiratory symptom; informed to use Ryaltris after saline rinses and start Mucinex.  Continue Arnuity.  Since last visit, she reports going to an integrative doctor who told her she had parasites and was started on therapy for that.  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    In terms of her asthma, she thinks that is doing well.  She has not had much SOB/wheezing. Rare albuterol use.  She is taking Arnuity 1 puff daily and gargles after.  No oral prednisone since last visit. No ER visits.    In terms of her rhinitis, she is still having a lot of post nasal drainage, throat clearing that is causing her to cough. She was on Ryaltris but has run out the past couple of weeks; reports this was helping her.  She was supposed to call [facility] because they needed some information but she forgot to.  She is doing saline rinses but no oral anti histamine. Denies any infections requiring antibiotics since last visit. She does report getting itchy watery eyes sometimes and other times red eyes.  She was given olopatadine to use PRN.    Past Medical History: Past Medical History:  Diagnosis Date   Abdominal pain 01/16/2013   Acid reflux    Allergy    Arthritis    ARTHRITIS IN NECK BY DR. Alroy Dust [facility]   Asthma    Interstitial cystitis    Osteoporosis    PONV (postoperative nausea and vomiting)    Tinnitus    Trigeminal neuralgia    Atypical trigeminal neuralgia    Objective:  BP  112/70   Pulse 70   Temp 98.3 F (36.8 C) (Temporal)   Resp 18   Wt 115 lb (52.2 kg)   SpO2 99%   BMI 18.01 kg/m  Body mass index is 18.01 kg/m. Physical Exam: GEN: alert, well developed HEENT: clear conjunctiva, TM grey and translucent, nose with moderate inferior turbinate hypertrophy, pale nasal mucosa, clear rhinorrhea, + cobblestoning HEART: regular rate and rhythm, no murmur LUNGS: clear to auscultation bilaterally, no coughing, unlabored respiration SKIN: no rashes or lesions  Spirometry:  Tracings reviewed. Her effort: Good reproducible efforts. FVC: 3.33L FEV1: 2.37L, 99% predicted FEV1/FVC ratio: 71% Interpretation: Spirometry consistent with normal pattern.  Please see scanned spirometry results for details.   Assessment:   1. Moderate persistent asthma without complication   2. Chronic rhinitis   3. Chronic cough   4. Post-nasal drainage     Plan/Recommendations:  Moderate persistent asthma - Controlled with normal spirometry today.  - Maintenance inhaler: Continue Arnuity 200 to 1 puff once a day to prevent cough and wheeze. Brush tongue and gargle afterwards.  - Rescue inhaler: Albuterol 2 puffs via spacer every 4-6 hours as needed for respiratory symptoms of cough, shortness of breath, or wheezing Asthma control goals:  Full participation in all desired activities (may need albuterol before activity) Albuterol use two times or less a week on average (not counting  use with activity) Cough interfering with sleep two times or less a month Oral steroids no more than once a year No hospitalizations   Chronic rhinitis Chronic Cough with PND - Uncontrolled, informed to restart Ryaltris and given sample today. She is to call [facility] with insurance information so that they can ship the spray.   - Also discussed at length that I do not recommend mold toxin testing as it has not clinical utility.  Her SPT to mold was all negative including intradermals.  She  also does not have ABPA (normal eosinophil count and IgE in the past).  She was also given tablets to help with the mold toxicity in her body but I recommended against taking that as there is no clear scientific evidence for it.   - Negative SPT 05/2021.  - Use nasal saline rinses before nose sprays such as with Neilmed Sinus Rinse.  Use distilled water.   - Continue Rylatris 2 sprays in each nostril twice a day as needed for nasal symptoms.  If you are unable to get it, then we can call in Flonase and Azelastine.  - Use Allegra '180mg'$  daily as needed.   - For itchy watery eyes, okay to use Olopatadine 1 eye drop daily as needed.  Do not use this for red eyes; use lubricating eye drops instead. - When you have a lot of mucous, you can use Mucinex '600mg'$  daily as needed.    Recurrent infections Keep track of infections, antibiotic and steroid use    Return in about 3 months (around 07/22/2022).  Harlon Flor, MD [facility]

## 2022-04-23 LAB — TSH: TSH: 0.363 u[IU]/mL — ABNORMAL LOW (ref 0.450–4.500)

## 2022-04-23 LAB — T3, FREE: T3, Free: 2.7 pg/mL (ref 2.0–4.4)

## 2022-04-23 LAB — T4, FREE: Free T4: 1.23 ng/dL (ref 0.82–1.77)

## 2022-04-27 ENCOUNTER — Ambulatory Visit: Payer: Medicare HMO | Admitting: Nurse Practitioner

## 2022-04-27 ENCOUNTER — Encounter: Payer: Self-pay | Admitting: Nurse Practitioner

## 2022-04-27 VITALS — BP 108/75 | HR 83 | Ht 67.0 in | Wt 115.6 lb

## 2022-04-27 DIAGNOSIS — R7989 Other specified abnormal findings of blood chemistry: Secondary | ICD-10-CM | POA: Diagnosis not present

## 2022-04-27 DIAGNOSIS — E0590 Thyrotoxicosis, unspecified without thyrotoxic crisis or storm: Secondary | ICD-10-CM | POA: Diagnosis not present

## 2022-04-27 NOTE — Progress Notes (Signed)
04/27/2022     Endocrinology Follow Up Note    Subjective:    Patient ID: Kristy Garza Kitchen, female    DOB: 09-Jul-1950, PCP [name], DO.   Past Medical History:  Diagnosis Date   Abdominal pain 01/16/2013   Acid reflux    Allergy    Arthritis    ARTHRITIS IN NECK BY DR. Alroy Dust [facility]   Asthma    Interstitial cystitis    Osteoporosis    PONV (postoperative nausea and vomiting)    Tinnitus    Trigeminal neuralgia    Atypical trigeminal neuralgia    Past Surgical History:  Procedure Laterality Date   ABDOMINAL HYSTERECTOMY  2000   CYSTOSCOPY WITH HYDRODISTENSION AND BIOPSY N/A 06/11/2012   Procedure: CYSTOSCOPY/BIOPSY/HYDRODISTENSION with Instillation of Pyridium and Marcaine and Kenalog;  Surgeon: Ailene Rud, MD;  Location: [facility];  Service: Urology;  Laterality: N/A;  1 hour requested for this case  BLADDER BIOPSY   ETHMOIDECTOMY  2012   SEPTOPLASTY  1980's   vocal cord surgery   1990's   polyp removal    Social History   Socioeconomic History   Marital status: Married    Spouse name: [name]   Number of children: 3   Years of education: 12   Highest education level: Some college, no degree  Occupational History   Occupation: CNA    Comment: Retired  Tobacco Use   Smoking status: Never   Smokeless tobacco: Never  Vaping Use   Vaping Use: Never used  Substance and Sexual Activity   Alcohol use: No    Alcohol/week: 0.0 standard drinks of alcohol    Comment: 01-19-2016 per pt no   Drug use: No    Comment: 01-19-2016 per pt no    Sexual activity: Not Currently    Birth control/protection: Surgical  Other Topics Concern   Not on file  Social History Narrative   Lives with husband.    Social Determinants of Health   Financial Resource Strain: Low Risk  (09/13/2019)   Overall Financial Resource Strain (CARDIA)    Difficulty of Paying Living Expenses: Not hard at all  Food Insecurity: No Food Insecurity  (09/13/2019)   Hunger Vital Sign    Worried About Running Out of Food in the Last Year: Never true    Ran Out of Food in the Last Year: Never true  Transportation Needs: Unmet Transportation Needs (03/08/2021)   PRAPARE - Transportation    Lack of Transportation (Medical): No    Lack of Transportation (Non-Medical): Yes  Physical Activity: Inactive (05/03/2021)   Exercise Vital Sign    Days of Exercise per Week: 0 days    Minutes of Exercise per Session: 0 min  Stress: Stress Concern Present (05/03/2021)   [place]    Feeling of Stress : Rather much  Social Connections: Socially Integrated (09/13/2019)   Social Connection and Isolation Panel [NHANES]    Frequency of Communication with Friends and Family: More than three times a week    Frequency of Social Gatherings with Friends and Family: More than three times a week    Attends Religious Services: More than 4 times per year    Active Member of [employer] or Organizations: Yes    Attends Music therapist: More than 4 times per year    Marital Status: Married    Family History  Problem Relation Age of Onset  Hyperlipidemia Mother    Arthritis Mother    Cancer Mother    Lymphoma Mother    Cancer Father    Allergies Father    Allergies Sister    Allergies Sister    Allergic rhinitis Neg Hx    Angioedema Neg Hx    Asthma Neg Hx    Eczema Neg Hx    Immunodeficiency Neg Hx    Urticaria Neg Hx     Outpatient Encounter Medications as of 04/27/2022  Medication Sig   acetaminophen (TYLENOL) 500 MG tablet Take by mouth.   albuterol (VENTOLIN HFA) 108 (90 Base) MCG/ACT inhaler TAKE 2 PUFFS BY MOUTH EVERY 6 HOURS AS NEEDED FOR WHEEZE OR SHORTNESS OF BREATH   ascorbic acid (VITAMIN C) 1000 MG tablet Take by mouth.   aspirin-acetaminophen-caffeine (EXCEDRIN MIGRAINE) 250-250-65 MG tablet Take by mouth.   b complex vitamins capsule Take 1 capsule by mouth daily.    calcium citrate-vitamin D (CITRACAL+D) 315-200 MG-UNIT tablet Take by mouth.   Cholecalciferol (VITAMIN D3) 125 MCG (5000 UT) CAPS Take 5,000 Units by mouth daily.   famotidine (PEPCID) 20 MG tablet Take 20 mg by mouth 2 (two) times daily.   Fluticasone Furoate (ARNUITY ELLIPTA) 200 MCG/ACT AEPB Inhale 1 puff into the lungs daily.   hyoscyamine (LEVSIN SL) 0.125 MG SL tablet Place under the tongue every 4 (four) hours as needed.   lidocaine (XYLOCAINE) 2 % jelly APPLY TOPICALLY TO AFFECTED AREA EVERY DAY AS NEEDED (use sparingly)   melatonin 5 MG TABS Take 5 mg by mouth.   metroNIDAZOLE (FLAGYL) 500 MG tablet Take 500 mg by mouth 2 (two) times daily.   olopatadine (PATANOL) 0.1 % ophthalmic solution Place 1 drop into both eyes 2 (two) times daily as needed for allergies.   Olopatadine-Mometasone (RYALTRIS) [id number] MCG/ACT SUSP Place 1-2 sprays into the nose in the morning and at bedtime.   Polyethylene Glycol 3350 (MIRALAX PO) Take by mouth as needed.   praziquantel (BILTRICIDE) 600 MG tablet Take 600 mg by mouth 3 (three) times daily.   Probiotic Product (PROBIOTIC PO) Take by mouth daily.   sennosides-docusate sodium (SENOKOT-S) 8.6-50 MG tablet Take 1 tablet by mouth daily.   Simethicone (GAS-X PO) Take by mouth.   tizanidine (ZANAFLEX) 2 MG capsule Take 1-2 capsules (2-4 mg total) by mouth 3 (three) times daily as needed for muscle spasms.   UNABLE TO FIND Take 1 capsule by mouth daily. Dv3- vitamin D 3 plus immune support   ferrous sulfate (FE TABS) 325 (65 FE) MG EC tablet Take 1 tablet (325 mg total) by mouth daily with breakfast. (Patient not taking: Reported on 04/22/2022)   hydrOXYzine (ATARAX) 25 MG tablet Take 25 mg by mouth at bedtime. (Patient not taking: Reported on 03/22/2022)   Lidocaine-Collagen-Aloe Vera (REGENECARE) 2 % GEL Apply topically daily. (Patient not taking: Reported on 03/22/2022)   Meth-Hyo-M Bl-Na Phos-Ph Sal (URO-MP) 118 MG CAPS Take 1 capsule by mouth 4 (four)  times daily as needed. (Patient not taking: Reported on 04/27/2022)   mirtazapine (REMERON) 7.5 MG tablet Take 7.5 mg by mouth at bedtime. (Patient not taking: Reported on 03/22/2022)   traMADol (ULTRAM) 50 MG tablet Take 1 tablet (50 mg total) by mouth every 12 (twelve) hours as needed. (Patient not taking: Reported on 04/22/2022)   Facility-Administered Encounter Medications as of 04/27/2022  Medication   zoledronic acid (RECLAST) injection 5 mg    ALLERGIES: Allergies  Allergen Reactions   Cefuroxime Axetil Other (See Comments)  Extreme gas   Gabapentin Other (See Comments)    Constipation Constipation   Montelukast Other (See Comments)    Numbness and tingling in hand. Numbness and tingling in hand. Numbness and tingling in hand. NUMBNESS OF HANDS AND FEET. Numbness in hands and feet NUMBNESS OF HANDS AND FEET.   Pregabalin Other (See Comments)    Drowsiness, dry mouth Drowsiness, dry mouth Makes her tired and thirsty   Augmentin [Amoxicillin-Pot Clavulanate] Diarrhea   Avelox [Moxifloxacin Hcl In Nacl] Other (See Comments)    Tremors    Biaxin [Clarithromycin]     Unsure of reaction   Cedax [Ceftibuten] Other (See Comments)    tremors   Ciprofloxacin Other (See Comments) and Nausea And Vomiting    Blurred vision Blurred vision Pt can't remember reaction   Clindamycin/Lincomycin     tachycardia   Doxycycline Diarrhea    Nausea , stomach upset   Levofloxacin Other (See Comments)    Feels dehydrated   Lincomycin Other (See Comments)    tachycardia   Montelukast Sodium Other (See Comments)    Numbness and tingling in hand. Numbness and tingling in hand.   Sulfonamide Derivatives Other (See Comments)    Gi upset   Sulfa Antibiotics Nausea Only    Other reaction(s): Other (See Comments) Gi upset  Other reaction(s): Other (See Comments) Gi upset Other reaction(s): Unknown    VACCINATION STATUS: Immunization History  Administered Date(s) Administered    Fluad Quad(high Dose 65+) 02/12/2021   Influenza,inj,Quad PF,6+ Mos 12/28/2012, 12/25/2013, 12/26/2014   PFIZER(Purple Top)SARS-COV-2 Vaccination 01/01/2020, 02/05/2020   Pneumococcal Conjugate-13 02/07/2013   Pneumococcal Polysaccharide-23 09/07/2017   Tdap 05/21/2010   Zoster, Live 03/27/2012     HPI  ESSA WENK is 72 y.o. female who presents today with a medical history as above. she is being seen in follow up after being seen in consultation for hyperthyroidism requested by [name], DO.  she has been dealing with symptoms of anxiety for several months now but she reports she is under a great amount of stress as her mothers health isnt well and she will likely pass soon. 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   Upon reviewing her referral, she was noticed to have slightly suppressed TSH dating back as far as 21.  She has never had any imaging studies of her thyroid in the past.   she denies family history of thyroid dysfunction and denies family hx of thyroid cancer. she denies personal history of goiter. she is not on any anti-thyroid medications nor on any thyroid hormone supplements. Denies use of Biotin containing supplements.  she is willing to proceed with appropriate work up and therapy for thyrotoxicosis.   Review of systems  Constitutional: + Minimally fluctuating body weight, current Body mass index is 18.11 kg/m., no fatigue, no subjective hyperthermia, no subjective hypothermia Eyes: no blurry vision, no xerophthalmia ENT: no sore throat, no nodules palpated in throat, no dysphagia/odynophagia, + hoarseness- chronic Cardiovascular: no chest pain, no shortness of breath, no palpitations, no leg swelling Respiratory: no cough, no shortness of breath Gastrointestinal: no nausea/vomiting/diarrhea Musculoskeletal: +  reports generalized weakness Skin: no rashes, no hyperemia Neurological: no tremors, no numbness, no tingling, no dizziness Psychiatric: no depression, + anxiety and related insomnia   Objective:    BP 108/75 (BP Location: Left Arm, Patient Position: Sitting, Cuff Size:  Normal)   Pulse 83   Ht '5\' 7"'$  (1.702 m)   Wt 115 lb 9.6 oz (52.4 kg)   BMI 18.11 kg/m   Wt Readings from Last 3 Encounters:  04/27/22 115 lb 9.6 oz (52.4 kg)  04/22/22 115 lb (52.2 kg)  03/22/22 115 lb 6.4 oz (52.3 kg)     BP Readings from Last 3 Encounters:  04/27/22 108/75  04/22/22 112/70  03/23/22 120/68                        Physical Exam- Limited  Constitutional:  Body mass index is 18.11 kg/m.-slightly malnourished appearance (recently diagnosed with intestinal parasites), not in acute distress, normal state of mind, + raspy voice-continued Eyes:  EOMI, no exophthalmos Musculoskeletal: no gross deformities, strength intact in all four extremities, no gross restriction of joint movements Skin:  no rashes, no hyperemia Neurological: slight tremor in right hand    CMP     Component Value Date/Time   NA 138 02/23/2022 0953   K 4.1 02/23/2022 0953   CL 103 02/23/2022 0953   CO2 23 02/23/2022 0953   GLUCOSE 92 02/23/2022 0953   GLUCOSE 95 03/25/2013 1052   BUN 12 02/23/2022 0953   CREATININE 0.80 02/23/2022 0953   CALCIUM 9.5 02/23/2022 0953   PROT 6.5 02/23/2022 0953   ALBUMIN 4.4 02/23/2022 0953   AST 22 02/23/2022 0953   ALT 12 02/23/2022 0953   ALKPHOS 54 02/23/2022 0953   BILITOT 0.3 02/23/2022 0953   GFRNONAA 84 11/18/2019 0914   GFRAA 96 11/18/2019 0914     CBC    Component Value Date/Time   WBC 4.1 03/22/2022 1559   WBC 5.9 10/05/2012 1228   RBC 3.73 (L) 03/22/2022 1559   HGB 11.9 (L) 03/22/2022 1559   HGB 11.1 02/23/2022 1000   HCT 36.0 03/22/2022 1559   HCT 34.6 02/23/2022 1000   PLT 209 03/22/2022 1559   PLT 185 02/23/2022 1000   MCV 96.5 03/22/2022 1559   MCV 96  02/23/2022 1000   MCH 31.9 03/22/2022 1559   MCHC 33.1 03/22/2022 1559   RDW 11.5 03/22/2022 1559   RDW 10.9 (L) 02/23/2022 1000   LYMPHSABS 1.0 03/22/2022 1559   LYMPHSABS 0.7 02/23/2022 1000   MONOABS 0.4 03/22/2022 1559   EOSABS 0.2 03/22/2022 1559   EOSABS 0.2 02/23/2022 1000   BASOSABS 0.0 03/22/2022 1559   BASOSABS 0.0 02/23/2022 1000     Diabetic Labs (most recent): Lab Results  Component Value Date   HGBA1C 5.1 06/15/2020    Lipid Panel     Component Value Date/Time   CHOL 181 07/26/2021 1039   TRIG 60 07/26/2021 1039   HDL 75 07/26/2021 1039   CHOLHDL 2.4 07/26/2021 1039   LDLCALC 94 07/26/2021 1039   LDLDIRECT 99 02/14/2014 1047   LABVLDL 12 07/26/2021 1039     Lab Results  Component Value Date   TSH 0.363 (L) 04/22/2022   TSH 0.335 (L) 10/20/2021   TSH 0.530 04/12/2021   TSH 0.440 (L) 01/26/2021   TSH 0.326 (L) 06/15/2020   TSH 0.467 11/18/2019   TSH 0.483 08/28/2018   TSH 0.474 06/12/2017   TSH 0.497 01/08/2016   TSH 0.377 (L) 04/08/2015   FREET4 1.23 04/22/2022   FREET4 1.42 10/20/2021   FREET4 1.15 04/12/2021   FREET4 1.30 01/26/2021   FREET4 1.25 06/15/2020   FREET4 1.42 01/08/2016   FREET4 1.42 07/30/2013      Latest  Reference Range & Units 01/26/21 08:46 04/12/21 14:30 10/20/21 10:49 04/22/22 14:19  TSH 0.450 - 4.500 uIU/mL 0.440 (L) 0.530 0.335 (L) 0.363 (L)  Triiodothyronine,Free,Serum 2.0 - 4.4 pg/mL  2.4 2.2 2.7  T4,Free(Direct) 0.82 - 1.77 ng/dL 1.30 1.15 1.42 1.23  Thyroperoxidase Ab SerPl-aCnc 0 - 34 IU/mL  11    Thyroglobulin Antibody 0.0 - 0.9 IU/mL  <1.0    (L): Data is abnormally low   Assessment & Plan:   1. Abnormal TSH 2. Subclinical hyperthyroidism  she is being seen at a kind request of [name], DO.  Her antibody testing rules out autoimmune thyroid dysfunction.    Her repeat thyroid function tests show slightly suppressed TSH, but consistent where she has been before, and normal Free T3 and Free T4  values.  She does not need any intervention at this time.  -There is no need for thyroid imaging at this time.  Will plan to repeat thyroid function tests in 1 year for surveillance purposes.    -Patient is advised to maintain close follow up with [name], DO for primary care needs. She could benefit from seeing nutritionist for malabsorption syndrome given her small size and complicated GI history.  I did enter order for this today.     I spent  26  minutes in the care of the patient today including review of labs from Thyroid Function, CMP, and other relevant labs ; imaging/biopsy records (current and previous including abstractions from other facilities); face-to-face time discussing  her lab results and symptoms, medications doses, her options of short and long term treatment based on the latest standards of care / guidelines;   and documenting the encounter.  Kristy Garza Kitchen  participated in the discussions, expressed understanding, and voiced agreement with the above plans.  All questions were answered to her satisfaction. she is encouraged to contact clinic should she have any questions or concerns prior to her return visit.   Follow up plan: Return in about 1 year (around 04/28/2023) for Thyroid follow up, Previsit labs.   Thank you for involving me in the care of this pleasant patient, and I will continue to update you with her progress.    Rayetta Pigg, [facility] [address] [place], [place] [address] Phone: [phone] Fax: [phone]  04/27/2022, 2:29 PM

## 2022-04-28 DIAGNOSIS — M5137 Other intervertebral disc degeneration, lumbosacral region: Secondary | ICD-10-CM | POA: Diagnosis not present

## 2022-04-28 DIAGNOSIS — M9903 Segmental and somatic dysfunction of lumbar region: Secondary | ICD-10-CM | POA: Diagnosis not present

## 2022-04-28 DIAGNOSIS — M9901 Segmental and somatic dysfunction of cervical region: Secondary | ICD-10-CM | POA: Diagnosis not present

## 2022-04-28 DIAGNOSIS — M9902 Segmental and somatic dysfunction of thoracic region: Secondary | ICD-10-CM | POA: Diagnosis not present

## 2022-04-29 DIAGNOSIS — N3010 Interstitial cystitis (chronic) without hematuria: Secondary | ICD-10-CM | POA: Diagnosis not present

## 2022-05-06 ENCOUNTER — Encounter: Payer: Self-pay | Admitting: Family Medicine

## 2022-05-06 ENCOUNTER — Ambulatory Visit (INDEPENDENT_AMBULATORY_CARE_PROVIDER_SITE_OTHER): Payer: Medicare HMO | Admitting: Family Medicine

## 2022-05-06 VITALS — BP 112/69 | HR 77 | Temp 97.8°F | Ht 67.0 in | Wt 111.2 lb

## 2022-05-06 DIAGNOSIS — N3010 Interstitial cystitis (chronic) without hematuria: Secondary | ICD-10-CM

## 2022-05-06 DIAGNOSIS — M545 Low back pain, unspecified: Secondary | ICD-10-CM | POA: Diagnosis not present

## 2022-05-06 DIAGNOSIS — G8929 Other chronic pain: Secondary | ICD-10-CM | POA: Diagnosis not present

## 2022-05-06 DIAGNOSIS — M542 Cervicalgia: Secondary | ICD-10-CM

## 2022-05-06 DIAGNOSIS — R300 Dysuria: Secondary | ICD-10-CM | POA: Diagnosis not present

## 2022-05-06 LAB — URINALYSIS, ROUTINE W REFLEX MICROSCOPIC
Bilirubin, UA: NEGATIVE
Glucose, UA: NEGATIVE
Ketones, UA: NEGATIVE
Nitrite, UA: POSITIVE — AB
Protein,UA: NEGATIVE
RBC, UA: NEGATIVE
Specific Gravity, UA: 1.01 (ref 1.005–1.030)
Urobilinogen, Ur: 0.2 mg/dL (ref 0.2–1.0)
pH, UA: 7 (ref 5.0–7.5)

## 2022-05-06 LAB — MICROSCOPIC EXAMINATION
Epithelial Cells (non renal): NONE SEEN /hpf (ref 0–10)
RBC, Urine: NONE SEEN /hpf (ref 0–2)
Renal Epithel, UA: NONE SEEN /hpf
WBC, UA: NONE SEEN /hpf (ref 0–5)

## 2022-05-06 MED ORDER — KETOROLAC TROMETHAMINE 30 MG/ML IJ SOLN
30.0000 mg | Freq: Once | INTRAMUSCULAR | Status: AC
  Administered 2022-05-06: 30 mg via INTRAMUSCULAR

## 2022-05-06 NOTE — Progress Notes (Signed)
Acute Office Visit  Subjective:     Patient ID: Kristy Garza, female    DOB: Sep 23, 1950, 72 y.o.   MRN: [id number]  Chief Complaint  Patient presents with   Dysuria   Neck Pain   Back Pain    HPI Patient is in today for dysuria. This is chronic. She believes its been a little worse than usual for her normal but She has hx of interstitial cystitis. She has an appt for an installation later today. Denies flank pain, hematuria, vomiting, chills, fever, increase frequency or urgency. She has taken AZO.   She has a hx of arthritis in her neck and lower back. She has had toradol injections in the past with good relief. It has been over 6 months since her last injection. She is interested in this today to help with her pain. She has been taking tylenol as well as using heat, ice, and stretching. She has been evaluated by ortho before. Denies  saddles anesthesia, changes in bowel or bladder control, or fever.    ROS As per HPI.      Objective:    BP 112/69   Pulse 77   Temp 97.8 F (36.6 C) (Temporal)   Ht 5' 7"$  (1.702 m)   Wt 111 lb 4 oz (50.5 kg)   SpO2 98%   BMI 17.42 kg/m    Physical Exam Vitals and nursing note reviewed.  Constitutional:      General: She is not in acute distress.    Appearance: She is not ill-appearing, toxic-appearing or diaphoretic.  Abdominal:     General: Bowel sounds are normal. There is no distension.     Palpations: Abdomen is soft.     Tenderness: There is no abdominal tenderness. There is no right CVA tenderness, left CVA tenderness, guarding or rebound.  Musculoskeletal:     Cervical back: Neck supple. No rigidity. Muscular tenderness present. No spinous process tenderness.     Lumbar back: Tenderness present. No swelling, edema, deformity, signs of trauma or bony tenderness.  Skin:    General: Skin is warm and dry.  Neurological:     General: No focal deficit present.     Mental Status: She is alert and oriented to person, place, and  time.  Psychiatric:        Mood and Affect: Mood normal.        Behavior: Behavior normal.     Results for orders placed or performed in visit on 05/06/22  Microscopic Examination   Urine  Result Value Ref Range   WBC, UA None seen 0 - 5 /hpf   RBC, Urine None seen 0 - 2 /hpf   Epithelial Cells (non renal) None seen 0 - 10 /hpf   Renal Epithel, UA None seen None seen /hpf   Bacteria, UA Few (A) None seen/Few  Urinalysis, Routine w reflex microscopic  Result Value Ref Range   Specific Gravity, UA 1.010 1.005 - 1.030   pH, UA 7.0 5.0 - 7.5   Color, UA Yellow Yellow   Appearance Ur Clear Clear   Leukocytes,UA Trace (A) Negative   Protein,UA Negative Negative/Trace   Glucose, UA Negative Negative   Ketones, UA Negative Negative   RBC, UA Negative Negative   Bilirubin, UA Negative Negative   Urobilinogen, Ur 0.2 0.2 - 1.0 mg/dL   Nitrite, UA Positive (A) Negative   Microscopic Examination See below:         Assessment & Plan:   Kristy Garza  was seen today for dysuria, neck pain and back pain.  Diagnoses and all orders for this visit:  Chronic interstitial cystitis without hematuria UA with nitrates today, trace leukes. Micro is overall normal though. She has taken AZO. Given chronic nature of symptoms, will await culture.  -     Urinalysis, Routine w reflex microscopic -     Urine Culture -     Microscopic Examination  Chronic bilateral low back pain without sciatica Chronic neck pain No red flags. Toradol IM injection today in the office. Continue tylenol, heat, ice, stretching.  -     ketorolac (TORADOL) 30 MG/ML injection 30 mg   Return to office for new or worsening symptoms, or if symptoms persist.   The patient indicates understanding of these issues and agrees with the plan.   Gwenlyn Perking, FNP

## 2022-05-09 LAB — URINE CULTURE

## 2022-05-11 DIAGNOSIS — N3010 Interstitial cystitis (chronic) without hematuria: Secondary | ICD-10-CM | POA: Diagnosis not present

## 2022-05-16 ENCOUNTER — Ambulatory Visit: Payer: Medicare HMO | Admitting: Family Medicine

## 2022-05-18 DIAGNOSIS — M9901 Segmental and somatic dysfunction of cervical region: Secondary | ICD-10-CM | POA: Diagnosis not present

## 2022-05-18 DIAGNOSIS — M9902 Segmental and somatic dysfunction of thoracic region: Secondary | ICD-10-CM | POA: Diagnosis not present

## 2022-05-18 DIAGNOSIS — M5137 Other intervertebral disc degeneration, lumbosacral region: Secondary | ICD-10-CM | POA: Diagnosis not present

## 2022-05-18 DIAGNOSIS — M9903 Segmental and somatic dysfunction of lumbar region: Secondary | ICD-10-CM | POA: Diagnosis not present

## 2022-05-26 DIAGNOSIS — N3010 Interstitial cystitis (chronic) without hematuria: Secondary | ICD-10-CM | POA: Diagnosis not present

## 2022-05-27 ENCOUNTER — Telehealth: Payer: Self-pay | Admitting: Family Medicine

## 2022-05-27 ENCOUNTER — Other Ambulatory Visit: Payer: Medicare HMO

## 2022-05-27 DIAGNOSIS — B700 Diphyllobothriasis: Secondary | ICD-10-CM | POA: Diagnosis not present

## 2022-05-27 NOTE — Telephone Encounter (Signed)
Called patient to schedule Medicare Annual Wellness Visit (AWV). No voicemail available to leave a message.  Last date of AWV: 12/02/2020   Please schedule an appointment at any time with either Mickel Baas or Melvin, NHA's. .  If any questions, please contact me at [phone].  Thank you,  Colletta Maryland,  [place] [facility] Direct Dial ??[phone]

## 2022-06-02 DIAGNOSIS — M5137 Other intervertebral disc degeneration, lumbosacral region: Secondary | ICD-10-CM | POA: Diagnosis not present

## 2022-06-02 DIAGNOSIS — M9903 Segmental and somatic dysfunction of lumbar region: Secondary | ICD-10-CM | POA: Diagnosis not present

## 2022-06-02 DIAGNOSIS — M9901 Segmental and somatic dysfunction of cervical region: Secondary | ICD-10-CM | POA: Diagnosis not present

## 2022-06-02 DIAGNOSIS — M9902 Segmental and somatic dysfunction of thoracic region: Secondary | ICD-10-CM | POA: Diagnosis not present

## 2022-06-20 ENCOUNTER — Telehealth: Payer: Self-pay | Admitting: Family Medicine

## 2022-06-20 NOTE — Telephone Encounter (Signed)
Contacted [name] to schedule their annual wellness visit. Appointment made for 07/08/2022.  Thank you,  Colletta Maryland,  [place] [facility] Direct Dial ??[phone]

## 2022-06-27 ENCOUNTER — Encounter: Payer: Self-pay | Admitting: Family Medicine

## 2022-06-27 ENCOUNTER — Ambulatory Visit (INDEPENDENT_AMBULATORY_CARE_PROVIDER_SITE_OTHER): Payer: Medicare HMO | Admitting: Family Medicine

## 2022-06-27 VITALS — BP 113/81 | HR 91 | Temp 98.2°F | Ht 67.0 in | Wt 112.0 lb

## 2022-06-27 DIAGNOSIS — K909 Intestinal malabsorption, unspecified: Secondary | ICD-10-CM

## 2022-06-27 DIAGNOSIS — M545 Low back pain, unspecified: Secondary | ICD-10-CM

## 2022-06-27 DIAGNOSIS — M542 Cervicalgia: Secondary | ICD-10-CM | POA: Diagnosis not present

## 2022-06-27 DIAGNOSIS — Z0001 Encounter for general adult medical examination with abnormal findings: Secondary | ICD-10-CM

## 2022-06-27 DIAGNOSIS — G8929 Other chronic pain: Secondary | ICD-10-CM

## 2022-06-27 DIAGNOSIS — G894 Chronic pain syndrome: Secondary | ICD-10-CM

## 2022-06-27 DIAGNOSIS — G4763 Sleep related bruxism: Secondary | ICD-10-CM | POA: Diagnosis not present

## 2022-06-27 DIAGNOSIS — R300 Dysuria: Secondary | ICD-10-CM

## 2022-06-27 DIAGNOSIS — R6884 Jaw pain: Secondary | ICD-10-CM

## 2022-06-27 DIAGNOSIS — M810 Age-related osteoporosis without current pathological fracture: Secondary | ICD-10-CM | POA: Diagnosis not present

## 2022-06-27 DIAGNOSIS — Z79899 Other long term (current) drug therapy: Secondary | ICD-10-CM | POA: Diagnosis not present

## 2022-06-27 DIAGNOSIS — Z0000 Encounter for general adult medical examination without abnormal findings: Secondary | ICD-10-CM

## 2022-06-27 LAB — URINALYSIS, ROUTINE W REFLEX MICROSCOPIC
Bilirubin, UA: NEGATIVE
Glucose, UA: NEGATIVE
Ketones, UA: NEGATIVE
Leukocytes,UA: NEGATIVE
Nitrite, UA: NEGATIVE
Protein,UA: NEGATIVE
RBC, UA: NEGATIVE
Specific Gravity, UA: 1.01 (ref 1.005–1.030)
Urobilinogen, Ur: 0.2 mg/dL (ref 0.2–1.0)
pH, UA: 7 (ref 5.0–7.5)

## 2022-06-27 MED ORDER — TRAMADOL HCL 50 MG PO TABS: 50.0000 mg | ORAL_TABLET | Freq: Two times a day (BID) | ORAL | 1 refills | Status: DC | PRN

## 2022-06-27 MED ORDER — TIZANIDINE HCL 2 MG PO CAPS: 2.0000 mg | ORAL_CAPSULE | Freq: Three times a day (TID) | ORAL | 2 refills | Status: DC | PRN

## 2022-06-27 NOTE — Patient Instructions (Signed)
Preventive Care 65 Years and Older, Female Preventive care refers to lifestyle choices and visits with your health care provider that can promote health and wellness. Preventive care visits are also called wellness exams. What can I expect for my preventive care visit? Counseling Your health care provider may ask you questions about your: Medical history, including: Past medical problems. Family medical history. Pregnancy and menstrual history. History of falls. Current health, including: Memory and ability to understand (cognition). Emotional well-being. Home life and relationship well-being. Sexual activity and sexual health. Lifestyle, including: Alcohol, nicotine or tobacco, and drug use. Access to firearms. Diet, exercise, and sleep habits. Work and work environment. Sunscreen use. Safety issues such as seatbelt and bike helmet use. Physical exam Your health care provider will check your: Height and weight. These may be used to calculate your BMI (body mass index). BMI is a measurement that tells if you are at a healthy weight. Waist circumference. This measures the distance around your waistline. This measurement also tells if you are at a healthy weight and may help predict your risk of certain diseases, such as type 2 diabetes and high blood pressure. Heart rate and blood pressure. Body temperature. Skin for abnormal spots. What immunizations do I need?  Vaccines are usually given at various ages, according to a schedule. Your health care provider will recommend vaccines for you based on your age, medical history, and lifestyle or other factors, such as travel or where you work. What tests do I need? Screening Your health care provider may recommend screening tests for certain conditions. This may include: Lipid and cholesterol levels. Hepatitis C test. Hepatitis B test. HIV (human immunodeficiency virus) test. STI (sexually transmitted infection) testing, if you are at  risk. Lung cancer screening. Colorectal cancer screening. Diabetes screening. This is done by checking your blood sugar (glucose) after you have not eaten for a while (fasting). Mammogram. Talk with your health care provider about how often you should have regular mammograms. BRCA-related cancer screening. This may be done if you have a family history of breast, ovarian, tubal, or peritoneal cancers. Bone density scan. This is done to screen for osteoporosis. Talk with your health care provider about your test results, treatment options, and if necessary, the need for more tests. Follow these instructions at home: Eating and drinking  Eat a diet that includes fresh fruits and vegetables, whole grains, lean protein, and low-fat dairy products. Limit your intake of foods with high amounts of sugar, saturated fats, and salt. Take vitamin and mineral supplements as recommended by your health care provider. Do not drink alcohol if your health care provider tells you not to drink. If you drink alcohol: Limit how much you have to 0-1 drink a day. Know how much alcohol is in your drink. In the [place] one drink equals one 12 oz bottle of beer (355 mL), one 5 oz glass of wine (148 mL), or one 1 oz glass of hard liquor (44 mL). Lifestyle Brush your teeth every morning and night with fluoride toothpaste. Floss one time each day. Exercise for at least 30 minutes 5 or more days each week. Do not use any products that contain nicotine or tobacco. These products include cigarettes, chewing tobacco, and vaping devices, such as e-cigarettes. If you need help quitting, ask your health care provider. Do not use drugs. If you are sexually active, practice safe sex. Use a condom or other form of protection in order to prevent STIs. Take aspirin only as told by   your health care provider. Make sure that you understand how much to take and what form to take. Work with your health care provider to find out whether it  is safe and beneficial for you to take aspirin daily. Ask your health care provider if you need to take a cholesterol-lowering medicine (statin). Find healthy ways to manage stress, such as: Meditation, yoga, or listening to music. Journaling. Talking to a trusted person. Spending time with friends and family. Minimize exposure to UV radiation to reduce your risk of skin cancer. Safety Always wear your seat belt while driving or riding in a vehicle. Do not drive: If you have been drinking alcohol. Do not ride with someone who has been drinking. When you are tired or distracted. While texting. If you have been using any mind-altering substances or drugs. Wear a helmet and other protective equipment during sports activities. If you have firearms in your house, make sure you follow all gun safety procedures. What's next? Visit your health care provider once a year for an annual wellness visit. Ask your health care provider how often you should have your eyes and teeth checked. Stay up to date on all vaccines. This information is not intended to replace advice given to you by your health care provider. Make sure you discuss any questions you have with your health care provider. Document Revised: 09/09/2020 Document Reviewed: 09/09/2020 Elsevier Patient Education  2023 Elsevier Inc.   

## 2022-06-27 NOTE — Progress Notes (Signed)
Kristy Garza is a 72 y.o. female presents to office today for annual physical exam examination.    Concerns today include: 1.  Dysuria Patient reports that she had some intermittent dysuria over the last couple of days.  She has history of interstitial cystitis.  Denies any hematuria, change in urine output, fevers or flank pain.  She does have chronic low back pain and needs a refill on both her tramadol and her Zanaflex.  She has chronic constipation.  She was told by her integrative specialist not to use MiraLAX but she notes that prunes and prune juice have not really helped move her bowels.  She is currently being treated for various fungal and parasitic infections per integrative specialist at [facility].  Her gastroenterologist has evaluated her for parasitic infection in the past and that was negative.  He plans on retesting her stool.  Apparently the test results that were positive from [facility] came from a veterinary lab.  Additionally, she has not been compliant with the fluconazole daily as prescribed for the visit yeast/fungal infections they have seen in her blood.  She has appointment with her allergist soon.  Occupation: retired Diet: limited, Exercise: no structured reported Last eye exam: UTD Last dental exam: UTD Last colonoscopy: UTD Last mammogram: UTD Last pap smear: n/a Refills needed today: as above Immunizations needed: Immunization History  Administered Date(s) Administered   Fluad Quad(high Dose 65+) 02/12/2021   Influenza,inj,Quad PF,6+ Mos 12/28/2012, 12/25/2013, 12/26/2014   PFIZER(Purple Top)SARS-COV-2 Vaccination 01/01/2020, 02/05/2020   Pneumococcal Conjugate-13 02/07/2013   Pneumococcal Polysaccharide-23 09/07/2017   Tdap 05/21/2010   Zoster, Live 03/27/2012     Past Medical History:  Diagnosis Date   Abdominal pain 01/16/2013   Acid reflux    Allergy    Arthritis    ARTHRITIS IN NECK BY DR. Alroy Dust [facility]   Asthma    Interstitial cystitis     Osteoporosis    PONV (postoperative nausea and vomiting)    Tinnitus    Trigeminal neuralgia    Atypical trigeminal neuralgia   Social History   Socioeconomic History   Marital status: Married    Spouse name: [name]   Number of children: 3   Years of education: 12   Highest education level: Some college, no degree  Occupational History   Occupation: CNA    Comment: Retired  Tobacco Use   Smoking status: Never   Smokeless tobacco: Never  Vaping Use   Vaping Use: Never used  Substance and Sexual Activity   Alcohol use: No    Alcohol/week: 0.0 standard drinks of alcohol    Comment: 01-19-2016 per pt no   Drug use: No    Comment: 01-19-2016 per pt no    Sexual activity: Not Currently    Birth control/protection: Surgical  Other Topics Concern   Not on file  Social History Narrative   Lives with husband.    Social Determinants of Health   Financial Resource Strain: Low Risk  (09/13/2019)   Overall Financial Resource Strain (CARDIA)    Difficulty of Paying Living Expenses: Not hard at all  Food Insecurity: No Food Insecurity (09/13/2019)   Hunger Vital Sign    Worried About Running Out of Food in the Last Year: Never true    Ran Out of Food in the Last Year: Never true  Transportation Needs: Unmet Transportation Needs (03/08/2021)   PRAPARE - Hydrologist (Medical): No    Lack of Transportation (Non-Medical): Yes  Physical Activity: Inactive (05/03/2021)   Exercise Vital Sign    Days of Exercise per Week: 0 days    Minutes of Exercise per Session: 0 min  Stress: Stress Concern Present (05/03/2021)   [place]    Feeling of Stress : Rather much  Social Connections: Socially Integrated (09/13/2019)   Social Connection and Isolation Panel [NHANES]    Frequency of Communication with Friends and Family: More than three times a week    Frequency of Social Gatherings with Friends  and Family: More than three times a week    Attends Religious Services: More than 4 times per year    Active Member of [employer] or Organizations: Yes    Attends Archivist Meetings: More than 4 times per year    Marital Status: Married  Human resources officer Violence: Not At Risk (09/13/2019)   Humiliation, Afraid, Rape, and Kick questionnaire    Fear of Current or Ex-Partner: No    Emotionally Abused: No    Physically Abused: No    Sexually Abused: No   Past Surgical History:  Procedure Laterality Date   ABDOMINAL HYSTERECTOMY  2000   CYSTOSCOPY WITH HYDRODISTENSION AND BIOPSY N/A 06/11/2012   Procedure: CYSTOSCOPY/BIOPSY/HYDRODISTENSION with Instillation of Pyridium and Marcaine and Kenalog;  Surgeon: Ailene Rud, MD;  Location: [facility];  Service: Urology;  Laterality: N/A;  1 hour requested for this case  BLADDER BIOPSY   ETHMOIDECTOMY  2012   SEPTOPLASTY  1980's   vocal cord surgery   1990's   polyp removal   Family History  Problem Relation Age of Onset   Hyperlipidemia Mother    Arthritis Mother    Cancer Mother    Lymphoma Mother    Cancer Father    Allergies Father    Allergies Sister    Allergies Sister    Allergic rhinitis Neg Hx    Angioedema Neg Hx    Asthma Neg Hx    Eczema Neg Hx    Immunodeficiency Neg Hx    Urticaria Neg Hx     Current Outpatient Medications:    acetaminophen (TYLENOL) 500 MG tablet, Take by mouth., Disp: , Rfl:    albuterol (VENTOLIN HFA) 108 (90 Base) MCG/ACT inhaler, TAKE 2 PUFFS BY MOUTH EVERY 6 HOURS AS NEEDED FOR WHEEZE OR SHORTNESS OF BREATH, Disp: 8 each, Rfl: 1   ascorbic acid (VITAMIN C) 1000 MG tablet, Take by mouth., Disp: , Rfl:    aspirin-acetaminophen-caffeine (EXCEDRIN MIGRAINE) 250-250-65 MG tablet, Take by mouth., Disp: , Rfl:    b complex vitamins capsule, Take 1 capsule by mouth daily., Disp: , Rfl:    calcium citrate-vitamin D (CITRACAL+D) 315-200 MG-UNIT tablet, Take by mouth., Disp: ,  Rfl:    Cholecalciferol (VITAMIN D3) 125 MCG (5000 UT) CAPS, Take 5,000 Units by mouth daily., Disp: , Rfl:    famotidine (PEPCID) 20 MG tablet, Take 20 mg by mouth 2 (two) times daily., Disp: , Rfl:    ferrous sulfate (FE TABS) 325 (65 FE) MG EC tablet, Take 1 tablet (325 mg total) by mouth daily with breakfast., Disp: 90 tablet, Rfl: 1   fluconazole (DIFLUCAN) 100 MG tablet, Take 100 mg by mouth in the morning and at bedtime., Disp: , Rfl:    Fluticasone Furoate (ARNUITY ELLIPTA) 200 MCG/ACT AEPB, Inhale 1 puff into the lungs daily., Disp: 30 each, Rfl: 5   hydrOXYzine (ATARAX) 25 MG tablet, Take 25 mg by mouth  at bedtime., Disp: , Rfl:    hyoscyamine (LEVSIN SL) 0.125 MG SL tablet, Place under the tongue every 4 (four) hours as needed., Disp: , Rfl:    lidocaine (XYLOCAINE) 2 % jelly, APPLY TOPICALLY TO AFFECTED AREA EVERY DAY AS NEEDED (use sparingly), Disp: 85 g, Rfl: 0   Lidocaine-Collagen-Aloe Vera (REGENECARE) 2 % GEL, Apply topically daily., Disp: 85 g, Rfl: 2   melatonin 5 MG TABS, Take 5 mg by mouth., Disp: , Rfl:    Meth-Hyo-M Bl-Na Phos-Ph Sal (URO-MP) 118 MG CAPS, Take 1 capsule by mouth 4 (four) times daily as needed., Disp: , Rfl:    mirtazapine (REMERON) 7.5 MG tablet, Take 7.5 mg by mouth at bedtime., Disp: , Rfl:    olopatadine (PATANOL) 0.1 % ophthalmic solution, Place 1 drop into both eyes 2 (two) times daily as needed for allergies., Disp: 5 mL, Rfl: 5   Olopatadine-Mometasone (RYALTRIS) 665-25 MCG/ACT SUSP, Place 1-2 sprays into the nose in the morning and at bedtime., Disp: 29 g, Rfl: 5   Polyethylene Glycol 3350 (MIRALAX PO), Take by mouth as needed., Disp: , Rfl:    praziquantel (BILTRICIDE) 600 MG tablet, Take 600 mg by mouth 3 (three) times daily., Disp: , Rfl:    Probiotic Product (PROBIOTIC PO), Take by mouth daily., Disp: , Rfl:    sennosides-docusate sodium (SENOKOT-S) 8.6-50 MG tablet, Take 1 tablet by mouth daily., Disp: 30 tablet, Rfl: 0   Simethicone (GAS-X PO),  Take by mouth., Disp: , Rfl:    tizanidine (ZANAFLEX) 2 MG capsule, Take 1-2 capsules (2-4 mg total) by mouth 3 (three) times daily as needed for muscle spasms., Disp: 60 capsule, Rfl: 2   traMADol (ULTRAM) 50 MG tablet, Take 1 tablet (50 mg total) by mouth every 12 (twelve) hours as needed., Disp: 30 tablet, Rfl: 1   UNABLE TO FIND, Take 1 capsule by mouth daily. Dv3- vitamin D 3 plus immune support, Disp: , Rfl:   Current Facility-Administered Medications:    zoledronic acid (RECLAST) injection 5 mg, 5 mg, Intravenous, Once, Delicia Berens M, DO  Allergies  Allergen Reactions   Cefuroxime Axetil Other (See Comments)    Extreme gas   Gabapentin Other (See Comments)    Constipation Constipation   Montelukast Other (See Comments)    Numbness and tingling in hand. Numbness and tingling in hand. Numbness and tingling in hand. NUMBNESS OF HANDS AND FEET. Numbness in hands and feet NUMBNESS OF HANDS AND FEET.   Pregabalin Other (See Comments)    Drowsiness, dry mouth Drowsiness, dry mouth Makes her tired and thirsty   Augmentin [Amoxicillin-Pot Clavulanate] Diarrhea   Avelox [Moxifloxacin Hcl In Nacl] Other (See Comments)    Tremors    Biaxin [Clarithromycin]     Unsure of reaction   Cedax [Ceftibuten] Other (See Comments)    tremors   Ciprofloxacin Other (See Comments) and Nausea And Vomiting    Blurred vision Blurred vision Pt can't remember reaction   Clindamycin/Lincomycin     tachycardia   Doxycycline Diarrhea    Nausea , stomach upset   Levofloxacin Other (See Comments)    Feels dehydrated   Lincomycin Other (See Comments)    tachycardia   Montelukast Sodium Other (See Comments)    Numbness and tingling in hand. Numbness and tingling in hand.   Sulfonamide Derivatives Other (See Comments)    Gi upset   Sulfa Antibiotics Nausea Only    Other reaction(s): Other (See Comments) Gi upset  Other reaction(s): Other (  See Comments) Gi upset Other reaction(s):  Unknown     ROS: Review of Systems Pertinent items noted in HPI and remainder of comprehensive ROS otherwise negative.    Physical exam BP 113/81   Pulse 91   Temp 98.2 F (36.8 C)   Ht 5\' 7"  (1.702 m)   Wt 112 lb (50.8 kg)   SpO2 95%   BMI 17.54 kg/m  General appearance: alert, cooperative, appears stated age, and thin/ underweight Head: Normocephalic, without obvious abnormality, atraumatic Eyes: negative findings: lids and lashes normal, conjunctivae and sclerae normal, corneas clear, and pupils equal, round, reactive to light and accomodation Ears: normal TM's and external ear canals both ears Nose: Nares normal. Septum midline. Mucosa normal. No drainage or sinus tenderness. Throat: lips, mucosa, and tongue normal; teeth and gums normal Neck: no adenopathy, no carotid bruit, supple, symmetrical, trachea midline, and thyroid not enlarged, symmetric, no tenderness/mass/nodules Back: mild scoliosis. ROM normal. No CVA tenderness. Lungs: clear to auscultation bilaterally Heart: regular rate and rhythm, S1, S2 normal, no murmur, click, rub or gallop Abdomen: soft, non-tender; bowel sounds normal; no masses,  no organomegaly Extremities: extremities normal, atraumatic, no cyanosis or edema Pulses: 2+ and symmetric Skin: Skin color, texture, turgor normal. No rashes or lesions Lymph nodes: Cervical, supraclavicular, and axillary nodes normal. Neurologic: Grossly normal GU: No suprapubic tenderness palpation.  Assessment/ Plan: [name] here for annual physical exam.   Annual physical exam  Chronic bilateral low back pain without sciatica - Plan: CMP14+EGFR, CBC, ToxASSURE Select 13 (MW), Urine  Chronic pain syndrome - Plan: traMADol (ULTRAM) 50 MG tablet  Bruxism, sleep-related - Plan: tizanidine (ZANAFLEX) 2 MG capsule  Chronic jaw pain - Plan: traMADol (ULTRAM) 50 MG tablet  Chronic neck pain - Plan: CMP14+EGFR, CBC, ToxASSURE Select 13 (MW), Urine  Controlled  substance agreement signed - Plan: CMP14+EGFR, CBC, ToxASSURE Select 13 (MW), Urine  Dysuria - Plan: Urinalysis, Routine w reflex microscopic  Fatty stools  Osteoporosis of multiple sites - Plan: DG [facility] DEXA  Labs collected today.  Toxassure updated for chronic pain.  CSC are also updated.  National narcotic database reviewed and there were no red flags.  UA without evidence of infection.  Likely IC acting up  I discussed with her that she should discuss these fatty stools that she has been experiencing with her gastroenterologist.  I did explain to her at length I am not very well versed in the naturopathic/ integrative care labs nor recommendations.  I do worry about her taking naltrexone for chronic pain as she takes tramadol intermittently and this will most certainly counteract that medication.  She continues to use that medication sparingly and appropriately  Future order for DEXA for [facility].  I encouraged her to schedule an appointment for this.  Counseled on healthy lifestyle choices, including diet (rich in fruits, vegetables and lean meats and low in salt and simple carbohydrates) and exercise (at least 30 minutes of moderate physical activity daily).  Patient to follow up in 1 year for annual exam or sooner if needed.  Takeo Harts M. [name], DO

## 2022-06-28 LAB — CBC
Hematocrit: 36.5 % (ref 34.0–46.6)
Hemoglobin: 12.3 g/dL (ref 11.1–15.9)
MCH: 31.1 pg (ref 26.6–33.0)
MCHC: 33.7 g/dL (ref 31.5–35.7)
MCV: 92 fL (ref 79–97)
Platelets: 223 10*3/uL (ref 150–450)
RBC: 3.96 x10E6/uL (ref 3.77–5.28)
RDW: 11.5 % — ABNORMAL LOW (ref 11.7–15.4)
WBC: 3.2 10*3/uL — ABNORMAL LOW (ref 3.4–10.8)

## 2022-06-28 LAB — CMP14+EGFR
ALT: 17 IU/L (ref 0–32)
AST: 29 IU/L (ref 0–40)
Albumin/Globulin Ratio: 2.1 (ref 1.2–2.2)
Albumin: 4.9 g/dL — ABNORMAL HIGH (ref 3.8–4.8)
Alkaline Phosphatase: 50 IU/L (ref 44–121)
BUN/Creatinine Ratio: 9 — ABNORMAL LOW (ref 12–28)
BUN: 8 mg/dL (ref 8–27)
Bilirubin Total: 0.6 mg/dL (ref 0.0–1.2)
CO2: 22 mmol/L (ref 20–29)
Calcium: 9.1 mg/dL (ref 8.7–10.3)
Chloride: 101 mmol/L (ref 96–106)
Creatinine, Ser: 0.88 mg/dL (ref 0.57–1.00)
Globulin, Total: 2.3 g/dL (ref 1.5–4.5)
Glucose: 93 mg/dL (ref 70–99)
Potassium: 4 mmol/L (ref 3.5–5.2)
Sodium: 136 mmol/L (ref 134–144)
Total Protein: 7.2 g/dL (ref 6.0–8.5)
eGFR: 70 mL/min/{1.73_m2} (ref >59)

## 2022-06-29 DIAGNOSIS — M9902 Segmental and somatic dysfunction of thoracic region: Secondary | ICD-10-CM | POA: Diagnosis not present

## 2022-06-29 DIAGNOSIS — M9903 Segmental and somatic dysfunction of lumbar region: Secondary | ICD-10-CM | POA: Diagnosis not present

## 2022-06-29 DIAGNOSIS — M9901 Segmental and somatic dysfunction of cervical region: Secondary | ICD-10-CM | POA: Diagnosis not present

## 2022-06-29 DIAGNOSIS — M5137 Other intervertebral disc degeneration, lumbosacral region: Secondary | ICD-10-CM | POA: Diagnosis not present

## 2022-06-29 LAB — TOXASSURE SELECT 13 (MW), URINE

## 2022-07-13 DIAGNOSIS — M9901 Segmental and somatic dysfunction of cervical region: Secondary | ICD-10-CM | POA: Diagnosis not present

## 2022-07-13 DIAGNOSIS — M9902 Segmental and somatic dysfunction of thoracic region: Secondary | ICD-10-CM | POA: Diagnosis not present

## 2022-07-13 DIAGNOSIS — M9903 Segmental and somatic dysfunction of lumbar region: Secondary | ICD-10-CM | POA: Diagnosis not present

## 2022-07-13 DIAGNOSIS — M5137 Other intervertebral disc degeneration, lumbosacral region: Secondary | ICD-10-CM | POA: Diagnosis not present

## 2022-07-14 ENCOUNTER — Ambulatory Visit (INDEPENDENT_AMBULATORY_CARE_PROVIDER_SITE_OTHER): Payer: Medicare HMO

## 2022-07-14 VITALS — Ht 68.0 in | Wt 115.0 lb

## 2022-07-14 DIAGNOSIS — Z0000 Encounter for general adult medical examination without abnormal findings: Secondary | ICD-10-CM | POA: Diagnosis not present

## 2022-07-14 DIAGNOSIS — Z1231 Encounter for screening mammogram for malignant neoplasm of breast: Secondary | ICD-10-CM

## 2022-07-14 NOTE — Progress Notes (Signed)
Subjective:   Kristy Garza is a 72 y.o. female who presents for Medicare Annual (Subsequent) preventive examination. I connected with  WINNELL BENTO on 07/14/22 by a audio enabled telemedicine application and verified that I am speaking with the correct person using two identifiers.  Patient Location: Home  Provider Location: Home Office  I discussed the limitations of evaluation and management by telemedicine. The patient expressed understanding and agreed to proceed.  Review of Systems     Cardiac Risk Factors include: advanced age (>42men, >78 women)     Objective:    Today's Vitals   07/14/22 1443  Weight: 115 lb (52.2 kg)  Height: 5\' 8"  (1.727 m)   Body mass index is 17.49 kg/m.     07/14/2022    2:49 PM 03/22/2022    2:49 PM 08/26/2021    1:36 PM 12/02/2020    9:43 AM 09/13/2019   10:13 AM 09/10/2018   10:46 AM 09/07/2017    2:48 PM  Advanced Directives  Does Patient Have a Medical Advance Directive? No No No No No No No  Would patient like information on creating a medical advance directive? No - Patient declined No - Patient declined  No - Patient declined No - Patient declined No - Patient declined Yes (MAU/Ambulatory/Procedural Areas - Information given)    Current Medications (verified) Outpatient Encounter Medications as of 07/14/2022  Medication Sig   acetaminophen (TYLENOL) 500 MG tablet Take by mouth.   albuterol (VENTOLIN HFA) 108 (90 Base) MCG/ACT inhaler TAKE 2 PUFFS BY MOUTH EVERY 6 HOURS AS NEEDED FOR WHEEZE OR SHORTNESS OF BREATH   ascorbic acid (VITAMIN C) 1000 MG tablet Take by mouth.   aspirin-acetaminophen-caffeine (EXCEDRIN MIGRAINE) 250-250-65 MG tablet Take by mouth.   b complex vitamins capsule Take 1 capsule by mouth daily.   calcium citrate-vitamin D (CITRACAL+D) 315-200 MG-UNIT tablet Take by mouth.   Cholecalciferol (VITAMIN D3) 125 MCG (5000 UT) CAPS Take 5,000 Units by mouth daily.   famotidine (PEPCID) 20 MG tablet Take 20 mg by  mouth 2 (two) times daily.   ferrous sulfate (FE TABS) 325 (65 FE) MG EC tablet Take 1 tablet (325 mg total) by mouth daily with breakfast.   fluconazole (DIFLUCAN) 100 MG tablet Take 100 mg by mouth in the morning and at bedtime.   Fluticasone Furoate (ARNUITY ELLIPTA) 200 MCG/ACT AEPB Inhale 1 puff into the lungs daily.   hydrOXYzine (ATARAX) 25 MG tablet Take 25 mg by mouth at bedtime.   hyoscyamine (LEVSIN SL) 0.125 MG SL tablet Place under the tongue every 4 (four) hours as needed.   lidocaine (XYLOCAINE) 2 % jelly APPLY TOPICALLY TO AFFECTED AREA EVERY DAY AS NEEDED (use sparingly)   Lidocaine-Collagen-Aloe Vera (REGENECARE) 2 % GEL Apply topically daily.   melatonin 5 MG TABS Take 5 mg by mouth.   Meth-Hyo-M Bl-Na Phos-Ph Sal (URO-MP) 118 MG CAPS Take 1 capsule by mouth 4 (four) times daily as needed.   mirtazapine (REMERON) 7.5 MG tablet Take 7.5 mg by mouth at bedtime.   olopatadine (PATANOL) 0.1 % ophthalmic solution Place 1 drop into both eyes 2 (two) times daily as needed for allergies.   Olopatadine-Mometasone (RYALTRIS) X543819 MCG/ACT SUSP Place 1-2 sprays into the nose in the morning and at bedtime.   Polyethylene Glycol 3350 (MIRALAX PO) Take by mouth as needed.   praziquantel (BILTRICIDE) 600 MG tablet Take 600 mg by mouth 3 (three) times daily.   Probiotic Product (PROBIOTIC PO) Take by  mouth daily.   sennosides-docusate sodium (SENOKOT-S) 8.6-50 MG tablet Take 1 tablet by mouth daily.   Simethicone (GAS-X PO) Take by mouth.   tizanidine (ZANAFLEX) 2 MG capsule Take 1-2 capsules (2-4 mg total) by mouth 3 (three) times daily as needed for muscle spasms.   traMADol (ULTRAM) 50 MG tablet Take 1 tablet (50 mg total) by mouth every 12 (twelve) hours as needed.   UNABLE TO FIND Take 1 capsule by mouth daily. Dv3- vitamin D 3 plus immune support   Facility-Administered Encounter Medications as of 07/14/2022  Medication   zoledronic acid (RECLAST) injection 5 mg    Allergies  (verified) Cefuroxime axetil, Gabapentin, Montelukast, Pregabalin, Augmentin [amoxicillin-pot clavulanate], Avelox [moxifloxacin hcl in nacl], Biaxin [clarithromycin], Cedax [ceftibuten], Ciprofloxacin, Clindamycin/lincomycin, Doxycycline, Levofloxacin, Lincomycin, Montelukast sodium, Sulfonamide derivatives, and Sulfa antibiotics   History: Past Medical History:  Diagnosis Date   Abdominal pain 01/16/2013   Acid reflux    Allergy    Arthritis    ARTHRITIS IN NECK BY DR. Clovis Riley [facility]   Asthma    Interstitial cystitis    Osteoporosis    PONV (postoperative nausea and vomiting)    Tinnitus    Trigeminal neuralgia    Atypical trigeminal neuralgia   Past Surgical History:  Procedure Laterality Date   ABDOMINAL HYSTERECTOMY  2000   CYSTOSCOPY WITH HYDRODISTENSION AND BIOPSY N/A 06/11/2012   Procedure: CYSTOSCOPY/BIOPSY/HYDRODISTENSION with Instillation of Pyridium and Marcaine and Kenalog;  Surgeon: Kathi Ludwig, MD;  Location: [facility] [place];  Service: Urology;  Laterality: N/A;  1 hour requested for this case  BLADDER BIOPSY   ETHMOIDECTOMY  2012   SEPTOPLASTY  1980's   vocal cord surgery   1990's   polyp removal   Family History  Problem Relation Age of Onset   Hyperlipidemia Mother    Arthritis Mother    Cancer Mother    Lymphoma Mother    Cancer Father    Allergies Father    Allergies Sister    Allergies Sister    Allergic rhinitis Neg Hx    Angioedema Neg Hx    Asthma Neg Hx    Eczema Neg Hx    Immunodeficiency Neg Hx    Urticaria Neg Hx    Social History   Socioeconomic History   Marital status: Married    Spouse name: Lyda Jester   Number of children: 3   Years of education: 12   Highest education level: Some college, no degree  Occupational History   Occupation: CNA    Comment: Retired  Tobacco Use   Smoking status: Never   Smokeless tobacco: Never  Vaping Use   Vaping Use: Never used  Substance and Sexual Activity   Alcohol use: No     Alcohol/week: 0.0 standard drinks of alcohol    Comment: 01-19-2016 per pt no   Drug use: No    Comment: 01-19-2016 per pt no    Sexual activity: Not Currently    Birth control/protection: Surgical  Other Topics Concern   Not on file  Social History Narrative   Lives with husband.    Social Determinants of Health   Financial Resource Strain: Low Risk  (07/14/2022)   Overall Financial Resource Strain (CARDIA)    Difficulty of Paying Living Expenses: Not hard at all  Food Insecurity: No Food Insecurity (07/14/2022)   Hunger Vital Sign    Worried About Running Out of Food in the Last Year: Never true    Ran Out of Food in  the Last Year: Never true  Transportation Needs: No Transportation Needs (07/14/2022)   PRAPARE - Administrator, Civil Service (Medical): No    Lack of Transportation (Non-Medical): No  Physical Activity: Insufficiently Active (07/14/2022)   Exercise Vital Sign    Days of Exercise per Week: 3 days    Minutes of Exercise per Session: 30 min  Stress: No Stress Concern Present (07/14/2022)   [employer] of Occupational Health - Occupational Stress Questionnaire    Feeling of Stress : Not at all  Social Connections: Moderately Integrated (07/14/2022)   Social Connection and Isolation Panel [NHANES]    Frequency of Communication with Friends and Family: More than three times a week    Frequency of Social Gatherings with Friends and Family: More than three times a week    Attends Religious Services: 1 to 4 times per year    Active Member of [employer] or Organizations: No    Attends Engineer, structural: Never    Marital Status: Married    Tobacco Counseling Counseling given: Not Answered   Clinical Intake:  Pre-visit preparation completed: Yes  Pain : No/denies pain     Nutritional Risks: None Diabetes: No  How often do you need to have someone help you when you read instructions, pamphlets, or other written materials from your  doctor or pharmacy?: 1 - Never  Diabetic?no   Interpreter Needed?: No  Information entered by :: Renie Ora, LPN   Activities of Daily Living    07/14/2022    2:50 PM  In your present state of health, do you have any difficulty performing the following activities:  Hearing? 0  Vision? 0  Difficulty concentrating or making decisions? 0  Walking or climbing stairs? 0  Dressing or bathing? 0  Doing errands, shopping? 0  Preparing Food and eating ? N  Using the Toilet? N  In the past six months, have you accidently leaked urine? N  Do you have problems with loss of bowel control? N  Managing your Medications? N  Managing your Finances? N  Housekeeping or managing your Housekeeping? N    Patient Care Team: Raliegh Ip, DO as PCP - General (Family Medicine) Waymon Budge, MD as Consulting Physician (Pulmonary Disease) Rollene Rotunda, MD as Consulting Physician (Cardiology) Randa Spike Kelton Pillar, LCSW as Social Worker (Licensed Clinical Social Worker) Marney Setting, MD as Referring Physician (Gastroenterology)  Indicate any recent Medical Services you may have received from other than [facility] providers in the past year (date may be approximate).     Assessment:   This is a routine wellness examination for Kristy Garza.  Hearing/Vision screen Vision Screening - Comments:: Wears rx glasses - up to date with routine eye exams with  Dr.johnson   Dietary issues and exercise activities discussed: Current Exercise Habits: Home exercise routine, Type of exercise: walking, Time (Minutes): 30, Frequency (Times/Week): 3, Weekly Exercise (Minutes/Week): 90, Intensity: Mild, Exercise limited by: None identified   Goals Addressed             This Visit's Progress    DIET - INCREASE WATER INTAKE   On track    Try to drink 6-8 glasses of water daily.       Depression Screen    07/14/2022    2:48 PM 06/27/2022   10:31 AM 05/06/2022   10:08 AM 02/23/2022    9:32 AM 02/04/2022     9:01 AM 12/27/2021    9:47 AM 11/22/2021   10:58  AM  PHQ 2/9 Scores  PHQ - 2 Score 0 0 0 0 0 0 0  PHQ- 9 Score 0 0 0 0 Fall Risk    07/14/2022    2:44 PM 06/27/2022   10:31 AM 05/06/2022   10:08 AM 02/23/2022    9:32 AM 02/04/2022    9:01 AM  Fall Risk   Falls in the past year? 0 0 0 0 0  Number falls in past yr: 0 0     Injury with Fall? 0 0     Risk for fall due to : No Fall Risks No Fall Risks     Follow up Falls prevention discussed Education provided       FALL RISK PREVENTION PERTAINING TO THE HOME:  Any stairs in or around the home? Yes  If so, are there any without handrails? No  Home free of loose throw rugs in walkways, pet beds, electrical cords, etc? Yes  Adequate lighting in your home to reduce risk of falls? Yes   ASSISTIVE DEVICES UTILIZED TO PREVENT FALLS:  Life alert? No  Use of a cane, walker or w/c? No  Grab bars in the bathroom? No  Shower chair or bench in shower? No  Elevated toilet seat or a handicapped toilet? No       09/07/2017    2:50 PM 07/27/2015    4:33 PM 07/11/2014   11:59 AM  MMSE - Mini Mental State Exam  Orientation to time Orientation to Place Registration Attention/ Calculation Recall Language- name 2 objects Language- repeat Language- follow 3 step command Language- read & follow direction Write a sentence Copy design Total score 07/14/2022    2:50 PM 12/02/2020    9:44 AM 09/13/2019   10:14 AM 09/10/2018   10:48 AM  6CIT Screen  What Year? 0 points 0 points 0 points 0 points  What month? 0 points 0 points 0 points 0 points  What time? 0 points 0 points 0 points 0 points  Count back from 20 0 points 0 points 0 points 0 points  Months in reverse 0 points 0 points 0 points 0 points  Repeat phrase 0 points 0 points 0 points 0 points  Total Score 0 points 0 points 0 points 0 points    Immunizations Immunization History   Administered Date(s) Administered   Fluad Quad(high Dose 65+) 02/12/2021   Influenza,inj,Quad PF,6+ Mos 12/28/2012, 12/25/2013, 12/26/2014   PFIZER(Purple Top)SARS-COV-2 Vaccination 01/01/2020, 02/05/2020   Pneumococcal Conjugate-13 02/07/2013   Pneumococcal Polysaccharide-23 09/07/2017   Tdap 05/21/2010   Zoster, Live 03/27/2012    TDAP status: Due, Education has been provided regarding the importance of this vaccine. Advised may receive this vaccine at local pharmacy or Health Dept. Aware to provide a copy of the vaccination record if obtained from local pharmacy or Health Dept. Verbalized acceptance and understanding.  Flu Vaccine status: Up to date  Pneumococcal vaccine status: Up to date  Covid-19 vaccine status: Completed vaccines  Qualifies for Shingles Vaccine? Yes   Zostavax completed No   Shingrix Completed?: No.    Education has been provided regarding the importance of this vaccine. Patient has  been advised to call insurance company to determine out of pocket expense if they have not yet received this vaccine. Advised may also receive vaccine at local pharmacy or Health Dept. Verbalized acceptance and understanding.  Screening Tests Health Maintenance  Topic Date Due   COVID-19 Vaccine (3 - Pfizer risk series) 03/04/2020   Zoster Vaccines- Shingrix (1 of 2) 09/26/2022 (Originally 11/18/1969)   MAMMOGRAM  02/03/2023 (Originally 02/01/2022)   INFLUENZA VACCINE  10/27/2022   DEXA SCAN  11/11/2022   Medicare Annual Wellness (AWV)  07/14/2023   COLONOSCOPY (Pts 45-35yrs Insurance coverage will need to be confirmed)  06/28/2025   Pneumonia Vaccine 36+ Years old  Completed   Hepatitis C Screening  Completed   HPV VACCINES  Aged Out   DTaP/Tdap/Td  Discontinued    Health Maintenance  Health Maintenance Due  Topic Date Due   COVID-19 Vaccine (3 - Pfizer risk series) 03/04/2020    Colorectal cancer screening: Type of screening: Colonoscopy. Completed 06/29/2018. Repeat  every 7 years  Mammogram status: Ordered 07/14/2022. Pt provided with contact info and advised to call to schedule appt.   Bone Density status: Ordered 06/27/2022. Pt provided with contact info and advised to call to schedule appt.  Lung Cancer Screening: (Low Dose CT Chest recommended if Age 80-80 years, 30 pack-year currently smoking OR have quit w/in 15years.) does not qualify.   Lung Cancer Screening Referral: n/a  Additional Screening:  Hepatitis C Screening: does not qualify;   Vision Screening: Recommended annual ophthalmology exams for early detection of glaucoma and other disorders of the eye. Is the patient up to date with their annual eye exam?  Yes  Who is the provider or what is the name of the office in which the patient attends annual eye exams? Dr.Johnson  If pt is not established with a provider, would they like to be referred to a provider to establish care? No .   Dental Screening: Recommended annual dental exams for proper oral hygiene  Community Resource Referral / Chronic Care Management: CRR required this visit?  No   CCM required this visit?  No      Plan:     I have personally reviewed and noted the following in the patient's chart:   Medical and social history Use of alcohol, tobacco or illicit drugs  Current medications and supplements including opioid prescriptions. Patient is not currently taking opioid prescriptions. Functional ability and status Nutritional status Physical activity Advanced directives List of other physicians Hospitalizations, surgeries, and ER visits in previous 12 months Vitals Screenings to include cognitive, depression, and falls Referrals and appointments  In addition, I have reviewed and discussed with patient certain preventive protocols, quality metrics, and best practice recommendations. A written personalized care plan for preventive services as well as general preventive health recommendations were provided to  patient.     Lorrene Reid, LPN   [id number]/61/0960   Nurse Notes: Due TDAP Vaccine

## 2022-07-14 NOTE — Patient Instructions (Signed)
Kristy Garza , Thank you for taking time to come for your Medicare Wellness Visit. I appreciate your ongoing commitment to your health goals. Please review the following plan we discussed and let me know if I can assist you in the future.   These are the goals we discussed:  Goals       DIET - INCREASE WATER INTAKE      Try to drink 6-8 glasses of water daily.      Exercise 150 min/wk Moderate Activity      Patient Stated (pt-stated)      " I would like to improve my health and gain more energy"      Patient Stated      12/02/2020 AWV Goal: Fall Prevention  Over the next year, patient will decrease their risk for falls by: Using assistive devices, such as a cane or walker, as needed Identifying fall risks within their home and correcting them by: Removing throw rugs Adding handrails to stairs or ramps Removing clutter and keeping a clear pathway throughout the home Increasing light, especially at night Adding shower handles/bars Raising toilet seat Identifying potential personal risk factors for falls: Medication side effects Incontinence/urgency Vestibular dysfunction Hearing loss Musculoskeletal disorders Neurological disorders Orthostatic hypotension          This is a list of the screening recommended for you and due dates:  Health Maintenance  Topic Date Due   COVID-19 Vaccine (3 - Pfizer risk series) 03/04/2020   Zoster (Shingles) Vaccine (1 of 2) 09/26/2022*   Mammogram  02/03/2023*   Flu Shot  10/27/2022   DEXA scan (bone density measurement)  11/11/2022   Medicare Annual Wellness Visit  07/14/2023   Colon Cancer Screening  06/28/2025   Pneumonia Vaccine  Completed   Hepatitis C Screening: USPSTF Recommendation to screen - Ages 18-79 yo.  Completed   HPV Vaccine  Aged Out   DTaP/Tdap/Td vaccine  Discontinued  *Topic was postponed. The date shown is not the original due date.    Advanced directives: Advance directive discussed with you today. I have provided  a copy for you to complete at home and have notarized. Once this is complete please bring a copy in to our office so we can scan it into your chart.   Conditions/risks identified: Aim for 30 minutes of exercise or brisk walking, 6-8 glasses of water, and 5 servings of fruits and vegetables each day.   Next appointment: Follow up in one year for your annual wellness visit    Preventive Care 65 Years and Older, Female Preventive care refers to lifestyle choices and visits with your health care provider that can promote health and wellness. What does preventive care include? A yearly physical exam. This is also called an annual well check. Dental exams once or twice a year. Routine eye exams. Ask your health care provider how often you should have your eyes checked. Personal lifestyle choices, including: Daily care of your teeth and gums. Regular physical activity. Eating a healthy diet. Avoiding tobacco and drug use. Limiting alcohol use. Practicing safe sex. Taking low-dose aspirin every day. Taking vitamin and mineral supplements as recommended by your health care provider. What happens during an annual well check? The services and screenings done by your health care provider during your annual well check will depend on your age, overall health, lifestyle risk factors, and family history of disease. Counseling  Your health care provider may ask you questions about your: Alcohol use. Tobacco use. Drug use.  Emotional well-being. Home and relationship well-being. Sexual activity. Eating habits. History of falls. Memory and ability to understand (cognition). Work and work Astronomer. Reproductive health. Screening  You may have the following tests or measurements: Height, weight, and BMI. Blood pressure. Lipid and cholesterol levels. These may be checked every 5 years, or more frequently if you are over 75 years old. Skin check. Lung cancer screening. You may have this  screening every year starting at age 14 if you have a 30-pack-year history of smoking and currently smoke or have quit within the past 15 years. Fecal occult blood test (FOBT) of the stool. You may have this test every year starting at age 103. Flexible sigmoidoscopy or colonoscopy. You may have a sigmoidoscopy every 5 years or a colonoscopy every 10 years starting at age 75. Hepatitis C blood test. Hepatitis B blood test. Sexually transmitted disease (STD) testing. Diabetes screening. This is done by checking your blood sugar (glucose) after you have not eaten for a while (fasting). You may have this done every 1-3 years. Bone density scan. This is done to screen for osteoporosis. You may have this done starting at age 77. Mammogram. This may be done every 1-2 years. Talk to your health care provider about how often you should have regular mammograms. Talk with your health care provider about your test results, treatment options, and if necessary, the need for more tests. Vaccines  Your health care provider may recommend certain vaccines, such as: Influenza vaccine. This is recommended every year. Tetanus, diphtheria, and acellular pertussis (Tdap, Td) vaccine. You may need a Td booster every 10 years. Zoster vaccine. You may need this after age 68. Pneumococcal 13-valent conjugate (PCV13) vaccine. One dose is recommended after age 80. Pneumococcal polysaccharide (PPSV23) vaccine. One dose is recommended after age 5. Talk to your health care provider about which screenings and vaccines you need and how often you need them. This information is not intended to replace advice given to you by your health care provider. Make sure you discuss any questions you have with your health care provider. Document Released: 04/10/2015 Document Revised: 12/02/2015 Document Reviewed: 01/13/2015 Elsevier Interactive Patient Education  2017 [employer].  Fall Prevention in the Home Falls can cause injuries.  They can happen to people of all ages. There are many things you can do to make your home safe and to help prevent falls. What can I do on the outside of my home? Regularly fix the edges of walkways and driveways and fix any cracks. Remove anything that might make you trip as you walk through a door, such as a raised step or threshold. Trim any bushes or trees on the path to your home. Use bright outdoor lighting. Clear any walking paths of anything that might make someone trip, such as rocks or tools. Regularly check to see if handrails are loose or broken. Make sure that both sides of any steps have handrails. Any raised decks and porches should have guardrails on the edges. Have any leaves, snow, or ice cleared regularly. Use sand or salt on walking paths during winter. Clean up any spills in your garage right away. This includes oil or grease spills. What can I do in the bathroom? Use night lights. Install grab bars by the toilet and in the tub and shower. Do not use towel bars as grab bars. Use non-skid mats or decals in the tub or shower. If you need to sit down in the shower, use a plastic, non-slip stool.  Keep the floor dry. Clean up any water that spills on the floor as soon as it happens. Remove soap buildup in the tub or shower regularly. Attach bath mats securely with double-sided non-slip rug tape. Do not have throw rugs and other things on the floor that can make you trip. What can I do in the bedroom? Use night lights. Make sure that you have a light by your bed that is easy to reach. Do not use any sheets or blankets that are too big for your bed. They should not hang down onto the floor. Have a firm chair that has side arms. You can use this for support while you get dressed. Do not have throw rugs and other things on the floor that can make you trip. What can I do in the kitchen? Clean up any spills right away. Avoid walking on wet floors. Keep items that you use a lot  in easy-to-reach places. If you need to reach something above you, use a strong step stool that has a grab bar. Keep electrical cords out of the way. Do not use floor polish or wax that makes floors slippery. If you must use wax, use non-skid floor wax. Do not have throw rugs and other things on the floor that can make you trip. What can I do with my stairs? Do not leave any items on the stairs. Make sure that there are handrails on both sides of the stairs and use them. Fix handrails that are broken or loose. Make sure that handrails are as long as the stairways. Check any carpeting to make sure that it is firmly attached to the stairs. Fix any carpet that is loose or worn. Avoid having throw rugs at the top or bottom of the stairs. If you do have throw rugs, attach them to the floor with carpet tape. Make sure that you have a light switch at the top of the stairs and the bottom of the stairs. If you do not have them, ask someone to add them for you. What else can I do to help prevent falls? Wear shoes that: Do not have high heels. Have rubber bottoms. Are comfortable and fit you well. Are closed at the toe. Do not wear sandals. If you use a stepladder: Make sure that it is fully opened. Do not climb a closed stepladder. Make sure that both sides of the stepladder are locked into place. Ask someone to hold it for you, if possible. Clearly mark and make sure that you can see: Any grab bars or handrails. First and last steps. Where the edge of each step is. Use tools that help you move around (mobility aids) if they are needed. These include: Canes. Walkers. Scooters. Crutches. Turn on the lights when you go into a dark area. Replace any light bulbs as soon as they burn out. Set up your furniture so you have a clear path. Avoid moving your furniture around. If any of your floors are uneven, fix them. If there are any pets around you, be aware of where they are. Review your medicines  with your doctor. Some medicines can make you feel dizzy. This can increase your chance of falling. Ask your doctor what other things that you can do to help prevent falls. This information is not intended to replace advice given to you by your health care provider. Make sure you discuss any questions you have with your health care provider. Document Released: 01/08/2009 Document Revised: 08/20/2015 Document Reviewed: 04/18/2014  Chartered certified accountant Patient Education  [employer].

## 2022-07-21 ENCOUNTER — Ambulatory Visit: Payer: Medicare HMO | Admitting: Allergy

## 2022-07-27 DIAGNOSIS — M9902 Segmental and somatic dysfunction of thoracic region: Secondary | ICD-10-CM | POA: Diagnosis not present

## 2022-07-27 DIAGNOSIS — M9903 Segmental and somatic dysfunction of lumbar region: Secondary | ICD-10-CM | POA: Diagnosis not present

## 2022-07-27 DIAGNOSIS — M5137 Other intervertebral disc degeneration, lumbosacral region: Secondary | ICD-10-CM | POA: Diagnosis not present

## 2022-07-27 DIAGNOSIS — M9901 Segmental and somatic dysfunction of cervical region: Secondary | ICD-10-CM | POA: Diagnosis not present

## 2022-08-08 ENCOUNTER — Encounter: Payer: Self-pay | Admitting: Family Medicine

## 2022-08-08 ENCOUNTER — Ambulatory Visit (INDEPENDENT_AMBULATORY_CARE_PROVIDER_SITE_OTHER): Payer: Medicare HMO | Admitting: Family Medicine

## 2022-08-08 ENCOUNTER — Other Ambulatory Visit: Payer: Medicare HMO

## 2022-08-08 VITALS — BP 103/61 | HR 78 | Temp 98.5°F | Ht 68.0 in | Wt 116.4 lb

## 2022-08-08 DIAGNOSIS — J329 Chronic sinusitis, unspecified: Secondary | ICD-10-CM | POA: Diagnosis not present

## 2022-08-08 DIAGNOSIS — R3989 Other symptoms and signs involving the genitourinary system: Secondary | ICD-10-CM | POA: Diagnosis not present

## 2022-08-08 LAB — URINALYSIS, ROUTINE W REFLEX MICROSCOPIC
Bilirubin, UA: NEGATIVE
Glucose, UA: NEGATIVE
Ketones, UA: NEGATIVE
Leukocytes,UA: NEGATIVE
Nitrite, UA: NEGATIVE
Protein,UA: NEGATIVE
RBC, UA: NEGATIVE
Specific Gravity, UA: 1.015 (ref 1.005–1.030)
Urobilinogen, Ur: 0.2 mg/dL (ref 0.2–1.0)
pH, UA: 8.5 — ABNORMAL HIGH (ref 5.0–7.5)

## 2022-08-08 MED ORDER — METHYLPREDNISOLONE ACETATE 40 MG/ML IJ SUSP
40.0000 mg | Freq: Once | INTRAMUSCULAR | Status: AC
  Administered 2022-08-08 (×2): 40 mg via INTRAMUSCULAR

## 2022-08-08 NOTE — Progress Notes (Signed)
Subjective: CC: Bladder pain, congestion PCP: Raliegh Ip, DO ZOX:WRUEAV ERCIE PLANK is a 72 y.o. female presenting to clinic today for:  1.  Bladder pain Patient reports that she has been having several days of bladder pain.  No hematuria reported.  No fevers or chills reported.  She started taking AZO 2 days ago when she started having burning.  She thinks it is probably time to see her specialist again.  She currently started an over-the-counter bladder supplement and wants to know if this is okay to take  2.  She reports coughing and congestion last week.  She notes that it is probably allergies but she wanted to get checked out.  No reports of fevers, purulence from nares.  She is taking some type of over-the-counter naturopathic antihistamine as well and wants to know if this is safe to continue.   ROS: Per HPI  Allergies  Allergen Reactions   Cefuroxime Axetil Other (See Comments)    Extreme gas   Gabapentin Other (See Comments)    Constipation Constipation   Montelukast Other (See Comments)    Numbness and tingling in hand. Numbness and tingling in hand. Numbness and tingling in hand. NUMBNESS OF HANDS AND FEET. Numbness in hands and feet NUMBNESS OF HANDS AND FEET.   Pregabalin Other (See Comments)    Drowsiness, dry mouth Drowsiness, dry mouth Makes her tired and thirsty   Augmentin [Amoxicillin-Pot Clavulanate] Diarrhea   Avelox [Moxifloxacin Hcl In Nacl] Other (See Comments)    Tremors    Biaxin [Clarithromycin]     Unsure of reaction   Cedax [Ceftibuten] Other (See Comments)    tremors   Ciprofloxacin Other (See Comments) and Nausea And Vomiting    Blurred vision Blurred vision Pt can't remember reaction   Clindamycin/Lincomycin     tachycardia   Doxycycline Diarrhea    Nausea , stomach upset   Levofloxacin Other (See Comments)    Feels dehydrated   Lincomycin Other (See Comments)    tachycardia   Montelukast Sodium Other (See Comments)     Numbness and tingling in hand. Numbness and tingling in hand.   Sulfonamide Derivatives Other (See Comments)    Gi upset   Sulfa Antibiotics Nausea Only    Other reaction(s): Other (See Comments) Gi upset  Other reaction(s): Other (See Comments) Gi upset Other reaction(s): Unknown   Past Medical History:  Diagnosis Date   Abdominal pain 01/16/2013   Acid reflux    Allergy    Arthritis    ARTHRITIS IN NECK BY DR. Clovis Riley [facility]   Asthma    Interstitial cystitis    Osteoporosis    PONV (postoperative nausea and vomiting)    Tinnitus    Trigeminal neuralgia    Atypical trigeminal neuralgia    Current Outpatient Medications:    acetaminophen (TYLENOL) 500 MG tablet, Take by mouth., Disp: , Rfl:    albuterol (VENTOLIN HFA) 108 (90 Base) MCG/ACT inhaler, TAKE 2 PUFFS BY MOUTH EVERY 6 HOURS AS NEEDED FOR WHEEZE OR SHORTNESS OF BREATH, Disp: 8 each, Rfl: 1   ascorbic acid (VITAMIN C) 1000 MG tablet, Take by mouth., Disp: , Rfl:    aspirin-acetaminophen-caffeine (EXCEDRIN MIGRAINE) 250-250-65 MG tablet, Take by mouth., Disp: , Rfl:    b complex vitamins capsule, Take 1 capsule by mouth daily., Disp: , Rfl:    calcium citrate-vitamin D (CITRACAL+D) 315-200 MG-UNIT tablet, Take by mouth., Disp: , Rfl:    Cholecalciferol (VITAMIN D3) 125 MCG (5000 UT) CAPS,  Take 5,000 Units by mouth daily., Disp: , Rfl:    famotidine (PEPCID) 20 MG tablet, Take 20 mg by mouth 2 (two) times daily., Disp: , Rfl:    ferrous sulfate (FE TABS) 325 (65 FE) MG EC tablet, Take 1 tablet (325 mg total) by mouth daily with breakfast., Disp: 90 tablet, Rfl: 1   fluconazole (DIFLUCAN) 100 MG tablet, Take 100 mg by mouth in the morning and at bedtime., Disp: , Rfl:    Fluticasone Furoate (ARNUITY ELLIPTA) 200 MCG/ACT AEPB, Inhale 1 puff into the lungs daily., Disp: 30 each, Rfl: 5   hydrOXYzine (ATARAX) 25 MG tablet, Take 25 mg by mouth at bedtime., Disp: , Rfl:    hyoscyamine (LEVSIN SL) 0.125 MG SL tablet, Place  under the tongue every 4 (four) hours as needed., Disp: , Rfl:    lidocaine (XYLOCAINE) 2 % jelly, APPLY TOPICALLY TO AFFECTED AREA EVERY DAY AS NEEDED (use sparingly), Disp: 85 g, Rfl: 0   Lidocaine-Collagen-Aloe Vera (REGENECARE) 2 % GEL, Apply topically daily., Disp: 85 g, Rfl: 2   melatonin 5 MG TABS, Take 5 mg by mouth., Disp: , Rfl:    Meth-Hyo-M Bl-Na Phos-Ph Sal (URO-MP) 118 MG CAPS, Take 1 capsule by mouth 4 (four) times daily as needed., Disp: , Rfl:    mirtazapine (REMERON) 7.5 MG tablet, Take 7.5 mg by mouth at bedtime., Disp: , Rfl:    olopatadine (PATANOL) 0.1 % ophthalmic solution, Place 1 drop into both eyes 2 (two) times daily as needed for allergies., Disp: 5 mL, Rfl: 5   Olopatadine-Mometasone (RYALTRIS) 665-25 MCG/ACT SUSP, Place 1-2 sprays into the nose in the morning and at bedtime., Disp: 29 g, Rfl: 5   Polyethylene Glycol 3350 (MIRALAX PO), Take by mouth as needed., Disp: , Rfl:    praziquantel (BILTRICIDE) 600 MG tablet, Take 600 mg by mouth 3 (three) times daily., Disp: , Rfl:    Probiotic Product (PROBIOTIC PO), Take by mouth daily., Disp: , Rfl:    sennosides-docusate sodium (SENOKOT-S) 8.6-50 MG tablet, Take 1 tablet by mouth daily., Disp: 30 tablet, Rfl: 0   Simethicone (GAS-X PO), Take by mouth., Disp: , Rfl:    tizanidine (ZANAFLEX) 2 MG capsule, Take 1-2 capsules (2-4 mg total) by mouth 3 (three) times daily as needed for muscle spasms., Disp: 60 capsule, Rfl: 2   traMADol (ULTRAM) 50 MG tablet, Take 1 tablet (50 mg total) by mouth every 12 (twelve) hours as needed., Disp: 30 tablet, Rfl: 1   UNABLE TO FIND, Take 1 capsule by mouth daily. Dv3- vitamin D 3 plus immune support, Disp: , Rfl:   Current Facility-Administered Medications:    zoledronic acid (RECLAST) injection 5 mg, 5 mg, Intravenous, Once, Steven Basso M, DO Social History   Socioeconomic History   Marital status: Married    Spouse name: Lyda Jester   Number of children: 3   Years of education: 12    Highest education level: Some college, no degree  Occupational History   Occupation: CNA    Comment: Retired  Tobacco Use   Smoking status: Never   Smokeless tobacco: Never  Vaping Use   Vaping Use: Never used  Substance and Sexual Activity   Alcohol use: No    Alcohol/week: 0.0 standard drinks of alcohol    Comment: 01-19-2016 per pt no   Drug use: No    Comment: 01-19-2016 per pt no    Sexual activity: Not Currently    Birth control/protection: Surgical  Other Topics Concern  Not on file  Social History Narrative   Lives with husband.    Social Determinants of Health   Financial Resource Strain: Low Risk  (07/14/2022)   Overall Financial Resource Strain (CARDIA)    Difficulty of Paying Living Expenses: Not hard at all  Food Insecurity: No Food Insecurity (07/14/2022)   Hunger Vital Sign    Worried About Running Out of Food in the Last Year: Never true    Ran Out of Food in the Last Year: Never true  Transportation Needs: No Transportation Needs (07/14/2022)   PRAPARE - Administrator, Civil Service (Medical): No    Lack of Transportation (Non-Medical): No  Physical Activity: Insufficiently Active (07/14/2022)   Exercise Vital Sign    Days of Exercise per Week: 3 days    Minutes of Exercise per Session: 30 min  Stress: No Stress Concern Present (07/14/2022)   [employer] of Occupational Health - Occupational Stress Questionnaire    Feeling of Stress : Not at all  Social Connections: Moderately Integrated (07/14/2022)   Social Connection and Isolation Panel [NHANES]    Frequency of Communication with Friends and Family: More than three times a week    Frequency of Social Gatherings with Friends and Family: More than three times a week    Attends Religious Services: 1 to 4 times per year    Active Member of [employer] or Organizations: No    Attends Banker Meetings: Never    Marital Status: Married  Catering manager Violence: Not At Risk  (07/14/2022)   Humiliation, Afraid, Rape, and Kick questionnaire    Fear of Current or Ex-Partner: No    Emotionally Abused: No    Physically Abused: No    Sexually Abused: No   Family History  Problem Relation Age of Onset   Hyperlipidemia Mother    Arthritis Mother    Cancer Mother    Lymphoma Mother    Cancer Father    Allergies Father    Allergies Sister    Allergies Sister    Allergic rhinitis Neg Hx    Angioedema Neg Hx    Asthma Neg Hx    Eczema Neg Hx    Immunodeficiency Neg Hx    Urticaria Neg Hx     Objective: Office vital signs reviewed. BP 103/61   Pulse 78   Temp 98.5 F (36.9 C)   Ht 5\' 8"  (1.727 m)   Wt 116 lb 6.4 oz (52.8 kg)   SpO2 99%   BMI 17.70 kg/m   Physical Examination:  General: Awake, alert, thin female, No acute distress HEENT: Normal    Neck: No masses palpated. No lymphadenopathy    Ears: Tympanic membranes intact, normal light reflex, no erythema, no bulging    Eyes: PERRLA, extraocular membranes intact, sclera white    Nose: nasal turbinates moist, clear nasal discharge    Throat: moist mucus membranes, no erythema, no tonsillar exudate.  Airway is patent Cardio: regular rate and rhythm, S1S2 heard, no murmurs appreciated Pulm: clear to auscultation bilaterally, no wheezes, rhonchi or rales; normal work of breathing on room air  Assessment/ Plan: 72 y.o. female   Bladder pain - Plan: Urinalysis, Routine w reflex microscopic  Rhinosinusitis - Plan: methylPREDNISolone acetate (DEPO-MEDROL) injection 40 mg  Urinalysis without evidence of infection.  Likely IC.  Depo-Medrol administered to aid with sinus issues that are not relieved by current medications.  No evidence of secondary bacterial infection at this time.  Orders Placed  This Encounter  Procedures   Urinalysis, Routine w reflex microscopic   No orders of the defined types were placed in this encounter.    Raliegh Ip, DO [facility] [phone]

## 2022-08-13 DIAGNOSIS — R309 Painful micturition, unspecified: Secondary | ICD-10-CM | POA: Diagnosis not present

## 2022-08-13 DIAGNOSIS — J0100 Acute maxillary sinusitis, unspecified: Secondary | ICD-10-CM | POA: Diagnosis not present

## 2022-08-19 ENCOUNTER — Ambulatory Visit (INDEPENDENT_AMBULATORY_CARE_PROVIDER_SITE_OTHER): Payer: Medicare HMO | Admitting: Family Medicine

## 2022-08-19 ENCOUNTER — Encounter: Payer: Self-pay | Admitting: Family Medicine

## 2022-08-19 DIAGNOSIS — R300 Dysuria: Secondary | ICD-10-CM | POA: Diagnosis not present

## 2022-08-19 LAB — URINALYSIS, ROUTINE W REFLEX MICROSCOPIC
Bilirubin, UA: NEGATIVE
Ketones, UA: NEGATIVE
Leukocytes,UA: NEGATIVE
Nitrite, UA: POSITIVE — AB
Protein,UA: NEGATIVE
RBC, UA: NEGATIVE
Specific Gravity, UA: 1.015 (ref 1.005–1.030)
Urobilinogen, Ur: 0.2 mg/dL (ref 0.2–1.0)
pH, UA: 7.5 (ref 5.0–7.5)

## 2022-08-19 LAB — MICROSCOPIC EXAMINATION: Bacteria, UA: NONE SEEN

## 2022-08-19 LAB — WET PREP FOR TRICH, YEAST, CLUE
Trichomonas Exam: NEGATIVE
Yeast Exam: NEGATIVE

## 2022-08-19 MED ORDER — NITROFURANTOIN MONOHYD MACRO 100 MG PO CAPS
100.0000 mg | ORAL_CAPSULE | Freq: Two times a day (BID) | ORAL | 0 refills | Status: AC
Start: 2022-08-19 — End: 2022-08-24

## 2022-08-19 NOTE — Patient Instructions (Signed)
Olly pro and prebiotic  Monistat

## 2022-08-19 NOTE — Progress Notes (Signed)
Acute Office Visit  Subjective:  Patient ID: Kristy Garza, female    DOB: 1950/06/16, 72 y.o.   MRN: [id number]  Chief Complaint  Patient presents with   Vaginitis    Patient recently put on Amoxicillin for sinusitis    HPI Patient is in today for vaginal itching with burning. She is currently on antibiotics (amox) for sinusitis, symptoms which started last Monday. She was given a steroid shot at that time. She started abx on Saturday. She states that she also has cystitis and endorses leakage. She is taking azo for burning, started Saturday, which is helping some. She is also using regenercare. States that she went to an integrative doctor in January and was prescribed Flucanozole for mold toxicity. She was prescribed it to take 2x per day. She took one flucanazole last.   ROS As per HPI   Objective:  BP 114/77   Pulse 82   Temp 98.5 F (36.9 C)   Ht 5\' 8"  (1.727 m)   Wt 116 lb (52.6 kg)   SpO2 99%   BMI 17.64 kg/m    Physical Exam Constitutional:      General: She is awake. She is not in acute distress.    Appearance: Normal appearance. She is well-developed and well-groomed. She is not ill-appearing, toxic-appearing or diaphoretic.  Cardiovascular:     Rate and Rhythm: Normal rate.     Pulses: Normal pulses.          Radial pulses are 2+ on the right side and 2+ on the left side.       Posterior tibial pulses are 2+ on the right side and 2+ on the left side.     Heart sounds: Normal heart sounds. No murmur heard.    No gallop.  Pulmonary:     Effort: Pulmonary effort is normal. No respiratory distress.     Breath sounds: Normal breath sounds. No stridor. No wheezing, rhonchi or rales.  Abdominal:     General: Abdomen is flat. Bowel sounds are normal. There is no distension. There are no signs of injury.     Tenderness: There is no abdominal tenderness. There is right CVA tenderness and left CVA tenderness.     Hernia: No hernia is present.  Musculoskeletal:      Cervical back: Full passive range of motion without pain and neck supple.     Right lower leg: No edema.     Left lower leg: No edema.  Skin:    General: Skin is warm.     Capillary Refill: Capillary refill takes less than 2 seconds.  Neurological:     General: No focal deficit present.     Mental Status: She is alert, oriented to person, place, and time and easily aroused. Mental status is at baseline.     GCS: GCS eye subscore is 4. GCS verbal subscore is 5. GCS motor subscore is 6.     Motor: No weakness.  Psychiatric:        Attention and Perception: Attention and perception normal.        Mood and Affect: Mood and affect normal.        Speech: Speech normal.        Behavior: Behavior normal. Behavior is cooperative.        Thought Content: Thought content normal. Thought content does not include homicidal or suicidal ideation. Thought content does not include homicidal or suicidal plan.        Cognition and  Memory: Cognition and memory normal.        Judgment: Judgment normal.     Assessment & Plan:  1. Dysuria Positive for E. Coli from cx at Urgent Care. Will start macrobid as below, which was susceptible on culture from urgent care. Limited in antibiotic choice due to patient allergies. Discussed with patient increasing hydration and selecting a pre and probiotic. Discussed with patient that symptoms may take some time to resolve.  - Urinalysis, Routine w reflex microscopic - Urine Culture - WET PREP FOR TRICH, YEAST, CLUE - nitrofurantoin, macrocrystal-monohydrate, (MACROBID) 100 MG capsule; Take 1 capsule (100 mg total) by mouth 2 (two) times daily for 5 days.  Dispense: 10 capsule; Refill: 0   The above assessment and management plan was discussed with the patient. The patient verbalized understanding of and has agreed to the management plan using shared-decision making. Patient is aware to call the clinic if they develop any new symptoms or if symptoms fail to improve or  worsen. Patient is aware when to return to the clinic for a follow-up visit. Patient educated on when it is appropriate to go to the emergency department.   Return if symptoms worsen or fail to improve.  Neale Burly, DNP-FNP [facility] [address] [place], [place] [address] [phone]

## 2022-08-23 ENCOUNTER — Other Ambulatory Visit: Payer: Medicare HMO

## 2022-08-23 LAB — URINE CULTURE

## 2022-08-24 ENCOUNTER — Other Ambulatory Visit: Payer: Medicare HMO

## 2022-08-24 ENCOUNTER — Telehealth: Payer: Self-pay | Admitting: Allergy and Immunology

## 2022-08-24 NOTE — Telephone Encounter (Signed)
Kristy Garza called in and states she has sinusitis and an UTI.  She states she has been taking 4mg  of prednisone and it is helping with pressure but she is still congested.  She has been using nasal rinses and recently finished a 7 day course of Amox 875mg .  Kristy Garza states this is not helping.  Kristy Garza is wanting to know what else can she do for pain and cough? She states she has been using her albuterol and arnuity and still having headaches, pressure in her cheeks, and her teeth hurt.  Kristy Garza denies fever and states she is having leg cramps and can't sleep.  Kristy Garza would like to know what she can do.  Please advise.

## 2022-08-24 NOTE — Telephone Encounter (Signed)
Given persistent symptoms despite antibiotics and steroids I recommend she come in to be evaluate.  It looks like we didn't prescribe the antibiotics or prednisone for current symptoms and we need to assess her before prescribing anything else.

## 2022-08-24 NOTE — Telephone Encounter (Signed)
I called the patient and she has an appointment tomorrow with her PCP and plans to see if they are able to help her with her symptoms while she is there for blood work and bladder evaluation. Patient may call the [facility] office to make an appointment for tomorrow if they are not able to help with her concerns.

## 2022-08-25 ENCOUNTER — Encounter: Payer: Self-pay | Admitting: Family

## 2022-08-25 ENCOUNTER — Ambulatory Visit (INDEPENDENT_AMBULATORY_CARE_PROVIDER_SITE_OTHER): Payer: Medicare HMO | Admitting: Family

## 2022-08-25 VITALS — BP 121/77 | HR 82 | Temp 97.9°F | Ht 68.0 in | Wt 116.0 lb

## 2022-08-25 DIAGNOSIS — R300 Dysuria: Secondary | ICD-10-CM | POA: Diagnosis not present

## 2022-08-25 DIAGNOSIS — Z87440 Personal history of urinary (tract) infections: Secondary | ICD-10-CM

## 2022-08-25 DIAGNOSIS — J320 Chronic maxillary sinusitis: Secondary | ICD-10-CM

## 2022-08-25 DIAGNOSIS — R252 Cramp and spasm: Secondary | ICD-10-CM

## 2022-08-25 LAB — CBC WITH DIFFERENTIAL/PLATELET
Basophils Absolute: 0 10*3/uL (ref 0.0–0.2)
Basos: 0 %
EOS (ABSOLUTE): 0 10*3/uL (ref 0.0–0.4)
Eos: 0 %
Hematocrit: 34.3 % (ref 34.0–46.6)
Hemoglobin: 10.9 g/dL — ABNORMAL LOW (ref 11.1–15.9)
Immature Grans (Abs): 0 10*3/uL (ref 0.0–0.1)
Immature Granulocytes: 0 %
Lymphocytes Absolute: 1.1 10*3/uL (ref 0.7–3.1)
Lymphs: 19 %
MCH: 30.5 pg (ref 26.6–33.0)
MCHC: 31.8 g/dL (ref 31.5–35.7)
MCV: 96 fL (ref 79–97)
Monocytes Absolute: 0.6 10*3/uL (ref 0.1–0.9)
Monocytes: 11 %
Neutrophils Absolute: 4 10*3/uL (ref 1.4–7.0)
Neutrophils: 70 %
Platelets: 221 10*3/uL (ref 150–450)
RBC: 3.57 x10E6/uL — ABNORMAL LOW (ref 3.77–5.28)
RDW: 11.4 % — ABNORMAL LOW (ref 11.7–15.4)
WBC: 5.8 10*3/uL (ref 3.4–10.8)

## 2022-08-25 LAB — URINALYSIS, COMPLETE
Bilirubin, UA: NEGATIVE
Glucose, UA: NEGATIVE
Ketones, UA: NEGATIVE
Leukocytes,UA: NEGATIVE
Nitrite, UA: NEGATIVE
Protein,UA: NEGATIVE
RBC, UA: NEGATIVE
Specific Gravity, UA: 1.015 (ref 1.005–1.030)
Urobilinogen, Ur: 0.2 mg/dL (ref 0.2–1.0)
pH, UA: 7.5 (ref 5.0–7.5)

## 2022-08-25 LAB — CMP14+EGFR
ALT: 16 IU/L (ref 0–32)
AST: 20 IU/L (ref 0–40)
Albumin/Globulin Ratio: 2.1 (ref 1.2–2.2)
Albumin: 4.6 g/dL (ref 3.8–4.8)
Alkaline Phosphatase: 43 IU/L — ABNORMAL LOW (ref 44–121)
BUN/Creatinine Ratio: 14 (ref 12–28)
BUN: 10 mg/dL (ref 8–27)
Bilirubin Total: 0.5 mg/dL (ref 0.0–1.2)
CO2: 23 mmol/L (ref 20–29)
Calcium: 9.6 mg/dL (ref 8.7–10.3)
Chloride: 100 mmol/L (ref 96–106)
Creatinine, Ser: 0.72 mg/dL (ref 0.57–1.00)
Globulin, Total: 2.2 g/dL (ref 1.5–4.5)
Glucose: 85 mg/dL (ref 70–99)
Potassium: 4.3 mmol/L (ref 3.5–5.2)
Sodium: 136 mmol/L (ref 134–144)
Total Protein: 6.8 g/dL (ref 6.0–8.5)
eGFR: 89 mL/min/{1.73_m2} (ref >59)

## 2022-08-25 LAB — MICROSCOPIC EXAMINATION
Bacteria, UA: NONE SEEN
RBC, Urine: NONE SEEN /hpf (ref 0–2)
WBC, UA: NONE SEEN /hpf (ref 0–5)

## 2022-08-25 NOTE — Telephone Encounter (Signed)
I called the patient and I will call again after lunch. Phone continued to ring and I was not able to leave a message.

## 2022-08-25 NOTE — Patient Instructions (Signed)
Leg Cramps Leg cramps occur when one or more muscles tighten and a person has no control over it (involuntary muscle contraction). Muscle cramps are most common in the calf muscles of the leg. They can occur during exercise or at rest. Leg cramps are painful, and they may last for a few seconds to a few minutes. Cramps may return several times before they finally stop. Usually, leg cramps are not caused by a serious medical problem. In many cases, the cause is not known. Some common causes include: Excessive physical effort (overexertion), such as during intense exercise. Doing the same motion over and over. Staying in a certain position for a long period of time. Improper preparation, form, or technique while doing a sport or an activity. Dehydration. Injury. Side effects of certain medicines. Abnormally low levels of minerals in your blood (electrolytes), especially potassium and calcium. This could result from: Pregnancy. Taking diuretic medicines. Follow these instructions at home: Eating and drinking Drink enough fluid to keep your urine pale yellow. Staying hydrated may help prevent cramps. Eat a healthy diet that includes plenty of nutrients to help your muscles function. A healthy diet includes fruits and vegetables, lean protein, whole grains, and low-fat or nonfat dairy products. Managing pain, stiffness, and swelling     Try massaging, stretching, and relaxing the affected muscle. Do this for several minutes at a time. If directed, put ice on areas that are sore or painful after a cramp. To do this: Put ice in a plastic bag. Place a towel between your skin and the bag. Leave the ice on for 20 minutes, 2-3 times a day. Remove the ice if your skin turns bright red. This is very important. If you cannot feel pain, heat, or cold, you have a greater risk of damage to the area. If directed, apply heat to muscles that are tense or tight. Do this before you exercise, or as often as told  by your health care provider. Use the heat source that your health care provider recommends, such as a moist heat pack or a heating pad. To do this: Place a towel between your skin and the heat source. Leave the heat on for 20-30 minutes. Remove the heat if your skin turns bright red. This is especially important if you are unable to feel pain, heat, or cold. You may have a greater risk of getting burned. Try taking hot showers or baths to help relax tight muscles. General instructions If you are having frequent leg cramps, avoid intense exercise for several days. Take over-the-counter and prescription medicines only as told by your health care provider. Keep all follow-up visits. This is important. Contact a health care provider if: Your leg cramps get more severe or more frequent, or they do not improve over time. Your foot becomes cold, numb, or blue. Summary Muscle cramps can develop in any muscle, but the most common place is in the calf muscles of the leg. Leg cramps are painful, and they may last for a few seconds to a few minutes. Usually, leg cramps are not caused by a serious medical problem. Often, the cause is not known. Stay hydrated, and take over-the-counter and prescription medicines only as told by your health care provider. This information is not intended to replace advice given to you by your health care provider. Make sure you discuss any questions you have with your health care provider. Document Revised: 07/31/2019 Document Reviewed: 07/31/2019 Elsevier Patient Education  2024 Elsevier Inc.  

## 2022-08-25 NOTE — Progress Notes (Signed)
   Subjective:    Patient ID: Kristy Garza, female    DOB: 08/02/1950, 72 y.o.   MRN: [id number]  Chief Complaint  Patient presents with   leg cramps   Urinary Tract Infection    Urinary Tract Infection    PT presents to the office today with complaints of leg cramps that started several days ago. Worried her magnesium or potassium is too low.   She has chronic sinusitis and is followed by allergen specialists. She is currently taking oral steroids that is helping with her pressure.   She was diagnosed with a UTI on 08/19/22 and given Nitrofurantion. She completed this yesterday. Wants her urine rechecked.    Review of Systems  All other systems reviewed and are negative.      Objective:   Physical Exam Vitals reviewed.  Constitutional:      General: She is not in acute distress.    Appearance: She is well-developed.  HENT:     Head: Normocephalic and atraumatic.  Eyes:     Pupils: Pupils are equal, round, and reactive to light.  Neck:     Thyroid: No thyromegaly.  Cardiovascular:     Rate and Rhythm: Normal rate and regular rhythm.     Heart sounds: Normal heart sounds. No murmur heard. Pulmonary:     Effort: Pulmonary effort is normal. No respiratory distress.     Breath sounds: Normal breath sounds. No wheezing.     Comments: Constant post nasal cough Abdominal:     General: Bowel sounds are normal. There is no distension.     Palpations: Abdomen is soft.     Tenderness: There is no abdominal tenderness.  Musculoskeletal:        General: No tenderness. Normal range of motion.     Cervical back: Normal range of motion and neck supple.  Skin:    General: Skin is warm and dry.  Neurological:     Mental Status: She is alert and oriented to person, place, and time.     Cranial Nerves: No cranial nerve deficit.     Deep Tendon Reflexes: Reflexes are normal and symmetric.  Psychiatric:        Behavior: Behavior normal.        Thought Content: Thought content  normal.        Judgment: Judgment normal.      BP 121/77   Pulse 82   Temp 97.9 F (36.6 C) (Temporal)   Ht 5\' 8"  (1.727 m)   Wt 116 lb (52.6 kg)   SpO2 99%   BMI 17.64 kg/m       Assessment & Plan:   Kristy Garza comes in today with chief complaint of leg cramps and Urinary Tract Infection   Diagnosis and orders addressed:  1. Hx: UTI (urinary tract infection) Resolved - Urinalysis, Complete - CMP14+EGFR - CBC with Differential/Platelet  2. Chronic maxillary sinusitis Continue steroids  Keep appointment with Allergen Specialists  - CMP14+EGFR - CBC with Differential/Platelet  3. Leg cramps Stay hydrated Labs pending  Muscle relaxer as needed - CMP14+EGFR - CBC with Differential/Platelet    Jannifer Rodney, FNP

## 2022-08-25 NOTE — Telephone Encounter (Signed)
Can you please check on this patient after lunch today? Thank you

## 2022-08-25 NOTE — Telephone Encounter (Signed)
Called and spoke with patient, she has been scheduled to see Dr. Marlynn Perking tomorrow in office.

## 2022-08-26 ENCOUNTER — Encounter: Payer: Self-pay | Admitting: Internal Medicine

## 2022-08-26 ENCOUNTER — Other Ambulatory Visit: Payer: Self-pay

## 2022-08-26 ENCOUNTER — Ambulatory Visit: Payer: Medicare HMO | Admitting: Internal Medicine

## 2022-08-26 VITALS — BP 102/68 | HR 74 | Temp 98.2°F | Ht 68.0 in | Wt 117.0 lb

## 2022-08-26 DIAGNOSIS — R053 Chronic cough: Secondary | ICD-10-CM

## 2022-08-26 DIAGNOSIS — B999 Unspecified infectious disease: Secondary | ICD-10-CM

## 2022-08-26 DIAGNOSIS — J0181 Other acute recurrent sinusitis: Secondary | ICD-10-CM

## 2022-08-26 DIAGNOSIS — J310 Chronic rhinitis: Secondary | ICD-10-CM

## 2022-08-26 DIAGNOSIS — J4540 Moderate persistent asthma, uncomplicated: Secondary | ICD-10-CM | POA: Diagnosis not present

## 2022-08-26 LAB — URINE CULTURE

## 2022-08-26 MED ORDER — AMOXICILLIN-POT CLAVULANATE 875-125 MG PO TABS: 1.0000 | ORAL_TABLET | Freq: Two times a day (BID) | ORAL | 0 refills | Status: AC

## 2022-08-26 NOTE — Progress Notes (Addendum)
Follow Up Note  RE: Kristy PEEKS MRN: [id number] DOB: [birth year] Date of Office Visit: 08/26/2022  Referring provider: Raliegh Ip, DO Primary care provider: Raliegh Ip, DO  Chief Complaint: Asthma, Cough, Follow-up, Nasal Polyps, Nasal Congestion, and Headache  History of Present Illness: I had the pleasure of seeing Kristy Garza for a follow up visit at the [facility] on 08/26/2022. She is a 72 y.o. female, who is being followed for asthma, chronic rhinitis, chronic cough. Her previous allergy office visit was on 04/25/22 with  Dr. Allena Katz . Today is a  acute visit for worsening sinus symptoms .  History obtained from patient, chart review .  Patient contacted the clinic on 08/24/2022 stating: Kristy Garza called in and states she has sinusitis and an UTI.  She states she has been taking 4mg  of prednisone and it is helping with pressure but she is still congested.  She has been using nasal rinses and recently finished a 7 day course of Amox 875mg .  Kristy Garza states this is not helping.  Kristy Garza is wanting to know what else can she do for pain and cough? She states she has been using her albuterol and arnuity and still having headaches, pressure in her cheeks, and her teeth hurt.  Kristy Garza denies fever and states she is having leg cramps and can't sleep.  Kristy Garza would like to know what she can do.  Please advise.      Instructed to follow-up in clinic as there is no record of a prednisone or antibiotic prescription.  Today she reports increased sinus tenderness, nasal congestion, cough since early May.  Initially saw primary care who gave her a Kenalog injection.  She saw urgent care on May 18 was prescribed amoxicillin for 5 days.  She reports no improvement with either therapies.  She had an old prednisone pack prescription at home which she started on her own and finished yesterday.  She does report improvement with cough with prednisone however she continues to  have sinus pressure, tenderness, nasal congestion.  She is continue to Arnuity 200 mcg 1 puff daily.  Did have to use albuterol for few days when she first had her symptoms but not needing now.  She has not seen ENT in a long time.  Using Flonase and Allegra daily without good effect.  She follows with an integrative health physician is concerned about mold toxicity and has been prescribed oral cromolyn, fluconazole and natural Histo block.  She has not started any of these medications.  Assessment and Plan: Kristy Garza is a 72 y.o. female with: Other acute recurrent sinusitis - Plan: Ambulatory referral to ENT  Moderate persistent asthma without complication - Plan: Spirometry with Graph  Nonallergic rhinitis  Chronic cough  Recurrent infections   Plan: Patient Instructions  Asthma - MDI technique discussed.   - Maintenance inhaler: Continue Arnuity 200 to 1 puff once a day to prevent cough and wheeze. Brush tongue and gargle afterwards.  - Rescue inhaler: Albuterol 2 puffs via spacer every 4-6 hours as needed for respiratory symptoms of cough, shortness of breath, or wheezing Asthma control goals:  Full participation in all desired activities (may need albuterol before activity) Albuterol use two times or less a week on average (not counting use with activity) Cough interfering with sleep two times or less a month Oral steroids no more than once a year No hospitalizations   Chronic rhinitis Chronic Cough with PND, mild sinus infection Start Augmentin 875/175  twice a day for 10 days We will refer you to ENT for nonallergic rhinitis and chronic sinus infections - Negative SPT 05/2021.  - Use nasal saline rinses before nose sprays such as with Neilmed Sinus Rinse.  Use distilled water.   -Continue Flonase and Astelin 1 spray per nostril twice a day - Use Allegra 180mg  daily as needed.   - For itchy watery eyes, okay to use Olopatadine 1 eye drop daily as needed.  Do not use this  for red eyes; use lubricating eye drops instead. - When you have a lot of mucous, you can use Mucinex 600mg  daily as needed.    Recurrent infections Keep track of infections, antibiotic and steroid use  Follow up: in 3 months   Thank you so much for letting me partake in your care today.  Don't hesitate to reach out if you have any additional concerns!  Ferol Luz, MD  [facility] [place] [place]    Meds ordered this encounter  Medications   amoxicillin-clavulanate (AUGMENTIN) 875-125 MG tablet    Sig: Take 1 tablet by mouth 2 (two) times daily for 10 days.    Dispense:  20 tablet    Refill:  0    Lab Orders  No laboratory test(s) ordered today   Diagnostics: Spirometry:  Tracings reviewed. Her effort: Good reproducible efforts. FVC: 3.39 L FEV1: 0.39 L, 100% predicted FEV1/FVC ratio: 71% Interpretation: Spirometry consistent with normal pattern.  Please see scanned spirometry results for details.   Medication List:  Current Outpatient Medications  Medication Sig Dispense Refill   acetaminophen (TYLENOL) 500 MG tablet Take by mouth.     albuterol (VENTOLIN HFA) 108 (90 Base) MCG/ACT inhaler TAKE 2 PUFFS BY MOUTH EVERY 6 HOURS AS NEEDED FOR WHEEZE OR SHORTNESS OF BREATH 8 each 1   amoxicillin-clavulanate (AUGMENTIN) 875-125 MG tablet Take 1 tablet by mouth 2 (two) times daily for 10 days. 20 tablet 0   ascorbic acid (VITAMIN C) 1000 MG tablet Take by mouth.     b complex vitamins capsule Take 1 capsule by mouth daily.     calcium citrate-vitamin D (CITRACAL+D) 315-200 MG-UNIT tablet Take by mouth.     Cholecalciferol (VITAMIN D3) 125 MCG (5000 UT) CAPS Take 5,000 Units by mouth daily.     Fluticasone Furoate (ARNUITY ELLIPTA) 200 MCG/ACT AEPB Inhale 1 puff into the lungs daily. 30 each 5   hydrOXYzine (ATARAX) 25 MG tablet Take 25 mg by mouth at bedtime.     lidocaine (XYLOCAINE) 2 % jelly APPLY TOPICALLY TO AFFECTED AREA EVERY DAY AS NEEDED (use  sparingly) 85 g 0   Lidocaine-Collagen-Aloe Vera (REGENECARE) 2 % GEL Apply topically daily. 85 g 2   melatonin 5 MG TABS Take 5 mg by mouth.     Polyethylene Glycol 3350 (MIRALAX PO) Take by mouth as needed.     Probiotic Product (PROBIOTIC PO) Take by mouth daily.     sennosides-docusate sodium (SENOKOT-S) 8.6-50 MG tablet Take 1 tablet by mouth daily. 30 tablet 0   Simethicone (GAS-X PO) Take by mouth.     tizanidine (ZANAFLEX) 2 MG capsule Take 1-2 capsules (2-4 mg total) by mouth 3 (three) times daily as needed for muscle spasms. 60 capsule 2   traMADol (ULTRAM) 50 MG tablet Take 1 tablet (50 mg total) by mouth every 12 (twelve) hours as needed. 30 tablet 1   UNABLE TO FIND Take 1 capsule by mouth daily. Dv3- vitamin D 3 plus immune  support     aspirin-acetaminophen-caffeine (EXCEDRIN MIGRAINE) 250-250-65 MG tablet Take by mouth. (Patient not taking: Reported on 08/26/2022)     famotidine (PEPCID) 20 MG tablet Take 20 mg by mouth 2 (two) times daily. (Patient not taking: Reported on 08/26/2022)     ferrous sulfate (FE TABS) 325 (65 FE) MG EC tablet Take 1 tablet (325 mg total) by mouth daily with breakfast. (Patient not taking: Reported on 08/26/2022) 90 tablet 1   hyoscyamine (LEVSIN SL) 0.125 MG SL tablet Place under the tongue every 4 (four) hours as needed. (Patient not taking: Reported on 08/26/2022)     Meth-Hyo-M Bl-Na Phos-Ph Sal (URO-MP) 118 MG CAPS Take 1 capsule by mouth 4 (four) times daily as needed. (Patient not taking: Reported on 08/26/2022)     mirtazapine (REMERON) 7.5 MG tablet Take 7.5 mg by mouth at bedtime. (Patient not taking: Reported on 08/26/2022)     NALTREXONE HCL PO Take by mouth. (Patient not taking: Reported on 08/26/2022)     olopatadine (PATANOL) 0.1 % ophthalmic solution Place 1 drop into both eyes 2 (two) times daily as needed for allergies. (Patient not taking: Reported on 08/26/2022) 5 mL 5   Olopatadine-Mometasone (RYALTRIS) 665-25 MCG/ACT SUSP Place 1-2 sprays  into the nose in the morning and at bedtime. (Patient not taking: Reported on 08/26/2022) 29 g 5   praziquantel (BILTRICIDE) 600 MG tablet Take 600 mg by mouth 3 (three) times daily. (Patient not taking: Reported on 08/26/2022)     Current Facility-Administered Medications  Medication Dose Route Frequency Provider Last Rate Last Admin   zoledronic acid (RECLAST) injection 5 mg  5 mg Intravenous Once Delynn Flavin M, DO       Allergies: Allergies  Allergen Reactions   Cefuroxime Axetil Other (See Comments)    Extreme gas   Gabapentin Other (See Comments)    Constipation Constipation   Montelukast Other (See Comments)    Numbness and tingling in hand. Numbness and tingling in hand. Numbness and tingling in hand. NUMBNESS OF HANDS AND FEET. Numbness in hands and feet NUMBNESS OF HANDS AND FEET.   Pregabalin Other (See Comments)    Drowsiness, dry mouth Drowsiness, dry mouth Makes her tired and thirsty   Augmentin [Amoxicillin-Pot Clavulanate] Diarrhea   Avelox [Moxifloxacin Hcl In Nacl] Other (See Comments)    Tremors    Biaxin [Clarithromycin]     Unsure of reaction   Cedax [Ceftibuten] Other (See Comments)    tremors   Ciprofloxacin Other (See Comments) and Nausea And Vomiting    Blurred vision Blurred vision Pt can't remember reaction   Clindamycin/Lincomycin     tachycardia   Doxycycline Diarrhea    Nausea , stomach upset   Levofloxacin Other (See Comments)    Feels dehydrated   Lincomycin Other (See Comments)    tachycardia   Montelukast Sodium Other (See Comments)    Numbness and tingling in hand. Numbness and tingling in hand.   Sulfonamide Derivatives Other (See Comments)    Gi upset   Sulfa Antibiotics Nausea Only    Other reaction(s): Other (See Comments) Gi upset  Other reaction(s): Other (See Comments) Gi upset Other reaction(s): Unknown   I reviewed her past medical history, social history, family history, and environmental history and no  significant changes have been reported from her previous visit.  ROS: All others negative except as noted per HPI.   Objective: BP 102/68   Pulse 74   Temp 98.2 F (36.8 C) (Temporal)  Ht 5\' 8"  (1.727 m)   Wt 117 lb (53.1 kg)   SpO2 99%   BMI 17.79 kg/m  Body mass index is 17.79 kg/m. General Appearance:  Alert, cooperative, no distress, appears stated age  Head:  Normocephalic, without obvious abnormality, atraumatic  Eyes:  Conjunctiva clear, EOM's intact  Nose: Nares normal,  erythematous nasal mucosa with scant clear to green rhinorrhea, sinus tenderness, no visible anterior polyps, and septum midline  Throat: Lips, tongue normal; teeth and gums normal, + cobblestoning  Neck: Supple, symmetrical  Lungs:   clear to auscultation bilaterally, Respirations unlabored, no coughing  Heart:  regular rate and rhythm and no murmur, Appears well perfused  Extremities: No edema  Skin: Skin color, texture, turgor normal, no rashes or lesions on visualized portions of skin  Neurologic: No gross deficits   Previous notes and tests were reviewed. The plan was reviewed with the patient/family, and all questions/concerned were addressed.  It was my pleasure to see Kristy Garza today and participate in her care. Please feel free to contact me with any questions or concerns.  Sincerely,  Ferol Luz, MD  Allergy & Immunology  [facility] Office: [phone]

## 2022-08-26 NOTE — Patient Instructions (Signed)
Asthma - MDI technique discussed.   - Maintenance inhaler: Continue Arnuity 200 to 1 puff once a day to prevent cough and wheeze. Brush tongue and gargle afterwards.  - Rescue inhaler: Albuterol 2 puffs via spacer every 4-6 hours as needed for respiratory symptoms of cough, shortness of breath, or wheezing Asthma control goals:  Full participation in all desired activities (may need albuterol before activity) Albuterol use two times or less a week on average (not counting use with activity) Cough interfering with sleep two times or less a month Oral steroids no more than once a year No hospitalizations   Chronic rhinitis Chronic Cough with PND, mild sinus infection Start Augmentin 875/175 twice a day for 10 days We will refer you to ENT for nonallergic rhinitis and chronic sinus infections - Negative SPT 05/2021.  - Use nasal saline rinses before nose sprays such as with Neilmed Sinus Rinse.  Use distilled water.   -Continue Flonase and Astelin 1 spray per nostril twice a day - Use Allegra 180mg  daily as needed.   - For itchy watery eyes, okay to use Olopatadine 1 eye drop daily as needed.  Do not use this for red eyes; use lubricating eye drops instead. - When you have a lot of mucous, you can use Mucinex 600mg  daily as needed.    Recurrent infections Keep track of infections, antibiotic and steroid use  Follow up: in 3 months   Thank you so much for letting me partake in your care today.  Don't hesitate to reach out if you have any additional concerns!  Ferol Luz, MD  [facility] [place] [place]

## 2022-08-26 NOTE — Addendum Note (Signed)
Addended by: Ralph Leyden on: 08/26/2022 01:17 PM   Modules accepted: Level of Service

## 2022-09-01 ENCOUNTER — Telehealth: Payer: Self-pay

## 2022-09-01 ENCOUNTER — Telehealth: Payer: Self-pay | Admitting: Allergy and Immunology

## 2022-09-01 NOTE — Telephone Encounter (Signed)
Patient called stating she's been on antibiotics since Friday 08/26/22 and she's still not feeling better. She still has a lot of drainage, headaches, teeth hurting, and she's concerned about these issues and doesn't want it any worse. She saw Dr. Marlynn Perking 08/26/22 and she prescribed the antibiotics, she's taking all of her medications and nothing is relieving the symptoms.   [phone]

## 2022-09-01 NOTE — Telephone Encounter (Signed)
See previous telephone encounter.

## 2022-09-01 NOTE — Telephone Encounter (Signed)
Patient called and said that she still sick and the antbo did not help and she is going to stop it now. said that she need to talk with you. [facility]  [phone]

## 2022-09-01 NOTE — Telephone Encounter (Signed)
-----   Message from Ferol Luz, MD sent at 08/26/2022 12:51 PM EDT ----- Please help facilitate referral to ENT for recurrent sinusitis

## 2022-09-01 NOTE — Telephone Encounter (Signed)
Continue current antibiotic until finished. This is her second antibiotic in a row for these symptoms.She needs to see ENT for possible sinus culture/imaging. Her immune labs in December 2022 were normal. Also her skin test to environmental allergies was negative on 05/28/22 It looks like at her last office visit with Dr. Marlynn Garza on 08/26/22 Dr. Marlynn Garza wanted a referral with ENT. I spoke with Kristy Garza, who helps with referrals and she has put in a referral for Dr. Suszanne Garza (ENT).She has already faxed over our information. She can contact Dr. Suszanne Garza' office and shcedule an appointment. (Contact information below) Su Kristy Doheny, MD [address] [place], [place] [address] [phone]

## 2022-09-01 NOTE — Telephone Encounter (Signed)
Referral has been faxed to Dr Avel Sensor office. Patient has been informed via Chrissie.

## 2022-09-01 NOTE — Telephone Encounter (Signed)
Spoke back to [facility] and informed her of the referral placed and gave her the information to Dr. Suszanne Conners and told her to give them a call to schedule an appointment with them. She was grateful for the response and time we took to handle this for her.  [place] [phone]

## 2022-09-06 ENCOUNTER — Ambulatory Visit: Payer: Medicare HMO | Admitting: Allergy and Immunology

## 2022-09-07 ENCOUNTER — Ambulatory Visit: Payer: Medicare HMO | Admitting: Family Medicine

## 2022-09-08 DIAGNOSIS — M5137 Other intervertebral disc degeneration, lumbosacral region: Secondary | ICD-10-CM | POA: Diagnosis not present

## 2022-09-08 DIAGNOSIS — M9903 Segmental and somatic dysfunction of lumbar region: Secondary | ICD-10-CM | POA: Diagnosis not present

## 2022-09-08 DIAGNOSIS — M9901 Segmental and somatic dysfunction of cervical region: Secondary | ICD-10-CM | POA: Diagnosis not present

## 2022-09-08 DIAGNOSIS — M9902 Segmental and somatic dysfunction of thoracic region: Secondary | ICD-10-CM | POA: Diagnosis not present

## 2022-09-09 ENCOUNTER — Ambulatory Visit (INDEPENDENT_AMBULATORY_CARE_PROVIDER_SITE_OTHER): Payer: Medicare HMO | Admitting: Family Medicine

## 2022-09-09 ENCOUNTER — Encounter: Payer: Self-pay | Admitting: Family Medicine

## 2022-09-09 ENCOUNTER — Telehealth: Payer: Self-pay | Admitting: Family Medicine

## 2022-09-09 VITALS — BP 127/67 | HR 73 | Temp 97.5°F | Ht 68.0 in | Wt 114.6 lb

## 2022-09-09 DIAGNOSIS — N3000 Acute cystitis without hematuria: Secondary | ICD-10-CM

## 2022-09-09 DIAGNOSIS — R300 Dysuria: Secondary | ICD-10-CM | POA: Diagnosis not present

## 2022-09-09 LAB — URINALYSIS, ROUTINE W REFLEX MICROSCOPIC
Bilirubin, UA: NEGATIVE
Glucose, UA: NEGATIVE
Ketones, UA: NEGATIVE
Leukocytes,UA: NEGATIVE
Nitrite, UA: POSITIVE — AB
Protein,UA: NEGATIVE
RBC, UA: NEGATIVE
Specific Gravity, UA: 1.01 (ref 1.005–1.030)
Urobilinogen, Ur: 0.2 mg/dL (ref 0.2–1.0)
pH, UA: 7 (ref 5.0–7.5)

## 2022-09-09 LAB — MICROSCOPIC EXAMINATION
Epithelial Cells (non renal): NONE SEEN /hpf (ref 0–10)
RBC, Urine: NONE SEEN /hpf (ref 0–2)
Renal Epithel, UA: NONE SEEN /hpf
WBC, UA: NONE SEEN /hpf (ref 0–5)

## 2022-09-09 MED ORDER — FOSFOMYCIN TROMETHAMINE 3 G PO PACK
3.0000 g | PACK | Freq: Once | ORAL | 0 refills | Status: AC
Start: 2022-09-09 — End: 2022-09-09

## 2022-09-09 NOTE — Telephone Encounter (Signed)
Pt had a visit with Kari Baars today and wants to know if she can prescribe her something for UTI pain? Pt uses [facility] in [place].

## 2022-09-09 NOTE — Telephone Encounter (Signed)
She can get AZO over the counter

## 2022-09-09 NOTE — Progress Notes (Signed)
Subjective:  Patient ID: Kristy Garza, female    DOB: 1950-05-07, 72 y.o.   MRN: [id number]  Patient Care Team: Raliegh Ip, DO as PCP - General (Family Medicine) Waymon Budge, MD as Consulting Physician (Pulmonary Disease) Rollene Rotunda, MD as Consulting Physician (Cardiology) Randa Spike Kelton Pillar, LCSW as Social Worker (Licensed Clinical Social Worker) Marney Setting, MD as Referring Physician (Gastroenterology)   Chief Complaint:  Dysuria (X 2-3 days )   HPI: Kristy Garza is a 72 y.o. female presenting on 09/09/2022 for Dysuria (X 2-3 days )   Pt presents today for dysuria. States she took a laxative and had an accident. Has had dysuria since the accident.   Dysuria  This is a new problem. The current episode started yesterday. The problem occurs every urination. The quality of the pain is described as aching and burning. The pain is moderate. There has been no fever. She is Not sexually active. There is No history of pyelonephritis. Pertinent negatives include no chills, discharge, flank pain, frequency, hematuria, hesitancy, nausea, possible pregnancy, sweats, urgency or vomiting. She has tried nothing for the symptoms. The treatment provided no relief.    Relevant past medical, surgical, family, and social history reviewed and updated as indicated.  Allergies and medications reviewed and updated. Data reviewed: Chart in Epic.   Past Medical History:  Diagnosis Date   Abdominal pain 01/16/2013   Acid reflux    Allergy    Arthritis    ARTHRITIS IN NECK BY DR. Clovis Riley [facility]   Asthma    Interstitial cystitis    Osteoporosis    PONV (postoperative nausea and vomiting)    Tinnitus    Trigeminal neuralgia    Atypical trigeminal neuralgia    Past Surgical History:  Procedure Laterality Date   ABDOMINAL HYSTERECTOMY  2000   CYSTOSCOPY WITH HYDRODISTENSION AND BIOPSY N/A 06/11/2012   Procedure: CYSTOSCOPY/BIOPSY/HYDRODISTENSION with Instillation of  Pyridium and Marcaine and Kenalog;  Surgeon: Kathi Ludwig, MD;  Location: [facility];  Service: Urology;  Laterality: N/A;  1 hour requested for this case  BLADDER BIOPSY   ETHMOIDECTOMY  2012   SEPTOPLASTY  1980's   vocal cord surgery   1990's   polyp removal    Social History   Socioeconomic History   Marital status: Married    Spouse name: Lyda Jester   Number of children: 3   Years of education: 12   Highest education level: Some college, no degree  Occupational History   Occupation: CNA    Comment: Retired  Tobacco Use   Smoking status: Never   Smokeless tobacco: Never  Vaping Use   Vaping Use: Never used  Substance and Sexual Activity   Alcohol use: No    Alcohol/week: 0.0 standard drinks of alcohol    Comment: 01-19-2016 per pt no   Drug use: No    Comment: 01-19-2016 per pt no    Sexual activity: Not Currently    Birth control/protection: Surgical  Other Topics Concern   Not on file  Social History Narrative   Lives with husband.    Social Determinants of Health   Financial Resource Strain: Low Risk  (07/14/2022)   Overall Financial Resource Strain (CARDIA)    Difficulty of Paying Living Expenses: Not hard at all  Food Insecurity: No Food Insecurity (07/14/2022)   Hunger Vital Sign    Worried About Running Out of Food in the Last Year: Never true    Ran  Out of Food in the Last Year: Never true  Transportation Needs: No Transportation Needs (07/14/2022)   PRAPARE - Administrator, Civil Service (Medical): No    Lack of Transportation (Non-Medical): No  Physical Activity: Insufficiently Active (07/14/2022)   Exercise Vital Sign    Days of Exercise per Week: 3 days    Minutes of Exercise per Session: 30 min  Stress: No Stress Concern Present (07/14/2022)   [employer] of Occupational Health - Occupational Stress Questionnaire    Feeling of Stress : Not at all  Social Connections: Moderately Integrated (07/14/2022)   Social  Connection and Isolation Panel [NHANES]    Frequency of Communication with Friends and Family: More than three times a week    Frequency of Social Gatherings with Friends and Family: More than three times a week    Attends Religious Services: 1 to 4 times per year    Active Member of [employer] or Organizations: No    Attends Banker Meetings: Never    Marital Status: Married  Catering manager Violence: Not At Risk (07/14/2022)   Humiliation, Afraid, Rape, and Kick questionnaire    Fear of Current or Ex-Partner: No    Emotionally Abused: No    Physically Abused: No    Sexually Abused: No    Outpatient Encounter Medications as of 09/09/2022  Medication Sig   acetaminophen (TYLENOL) 500 MG tablet Take by mouth.   albuterol (VENTOLIN HFA) 108 (90 Base) MCG/ACT inhaler TAKE 2 PUFFS BY MOUTH EVERY 6 HOURS AS NEEDED FOR WHEEZE OR SHORTNESS OF BREATH   ascorbic acid (VITAMIN C) 1000 MG tablet Take by mouth.   aspirin-acetaminophen-caffeine (EXCEDRIN MIGRAINE) 250-250-65 MG tablet Take by mouth.   b complex vitamins capsule Take 1 capsule by mouth daily.   calcium citrate-vitamin D (CITRACAL+D) 315-200 MG-UNIT tablet Take by mouth.   Cholecalciferol (VITAMIN D3) 125 MCG (5000 UT) CAPS Take 5,000 Units by mouth daily.   famotidine (PEPCID) 20 MG tablet Take 20 mg by mouth 2 (two) times daily.   ferrous sulfate (FE TABS) 325 (65 FE) MG EC tablet Take 1 tablet (325 mg total) by mouth daily with breakfast.   Fluticasone Furoate (ARNUITY ELLIPTA) 200 MCG/ACT AEPB Inhale 1 puff into the lungs daily.   fosfomycin (MONUROL) 3 g PACK Take 3 g by mouth once for 1 dose.   hydrOXYzine (ATARAX) 25 MG tablet Take 25 mg by mouth at bedtime.   hyoscyamine (LEVSIN SL) 0.125 MG SL tablet Place under the tongue every 4 (four) hours as needed.   lidocaine (XYLOCAINE) 2 % jelly APPLY TOPICALLY TO AFFECTED AREA EVERY DAY AS NEEDED (use sparingly)   Lidocaine-Collagen-Aloe Vera (REGENECARE) 2 % GEL Apply  topically daily.   melatonin 5 MG TABS Take 5 mg by mouth.   Meth-Hyo-M Bl-Na Phos-Ph Sal (URO-MP) 118 MG CAPS Take 1 capsule by mouth 4 (four) times daily as needed.   mirtazapine (REMERON) 7.5 MG tablet Take 7.5 mg by mouth at bedtime.   NALTREXONE HCL PO Take by mouth.   olopatadine (PATANOL) 0.1 % ophthalmic solution Place 1 drop into both eyes 2 (two) times daily as needed for allergies.   Olopatadine-Mometasone (RYALTRIS) X543819 MCG/ACT SUSP Place 1-2 sprays into the nose in the morning and at bedtime.   Polyethylene Glycol 3350 (MIRALAX PO) Take by mouth as needed.   praziquantel (BILTRICIDE) 600 MG tablet Take 600 mg by mouth 3 (three) times daily.   Probiotic Product (PROBIOTIC PO) Take by  mouth daily.   sennosides-docusate sodium (SENOKOT-S) 8.6-50 MG tablet Take 1 tablet by mouth daily.   Simethicone (GAS-X PO) Take by mouth.   tizanidine (ZANAFLEX) 2 MG capsule Take 1-2 capsules (2-4 mg total) by mouth 3 (three) times daily as needed for muscle spasms.   traMADol (ULTRAM) 50 MG tablet Take 1 tablet (50 mg total) by mouth every 12 (twelve) hours as needed.   UNABLE TO FIND Take 1 capsule by mouth daily. Dv3- vitamin D 3 plus immune support   Facility-Administered Encounter Medications as of 09/09/2022  Medication   zoledronic acid (RECLAST) injection 5 mg    Allergies  Allergen Reactions   Cefuroxime Axetil Other (See Comments)    Extreme gas   Gabapentin Other (See Comments)    Constipation Constipation   Montelukast Other (See Comments)    Numbness and tingling in hand. Numbness and tingling in hand. Numbness and tingling in hand. NUMBNESS OF HANDS AND FEET. Numbness in hands and feet NUMBNESS OF HANDS AND FEET.   Pregabalin Other (See Comments)    Drowsiness, dry mouth Drowsiness, dry mouth Makes her tired and thirsty   Augmentin [Amoxicillin-Pot Clavulanate] Diarrhea   Avelox [Moxifloxacin Hcl In Nacl] Other (See Comments)    Tremors    Biaxin [Clarithromycin]      Unsure of reaction   Cedax [Ceftibuten] Other (See Comments)    tremors   Ciprofloxacin Other (See Comments) and Nausea And Vomiting    Blurred vision Blurred vision Pt can't remember reaction   Clindamycin/Lincomycin     tachycardia   Doxycycline Diarrhea    Nausea , stomach upset   Levofloxacin Other (See Comments)    Feels dehydrated   Lincomycin Other (See Comments)    tachycardia   Montelukast Sodium Other (See Comments)    Numbness and tingling in hand. Numbness and tingling in hand.   Sulfonamide Derivatives Other (See Comments)    Gi upset   Sulfa Antibiotics Nausea Only    Other reaction(s): Other (See Comments) Gi upset  Other reaction(s): Other (See Comments) Gi upset Other reaction(s): Unknown    Review of Systems  Constitutional:  Negative for activity change, appetite change, chills, diaphoresis, fatigue, fever and unexpected weight change.  Eyes:  Negative for photophobia and visual disturbance.  Respiratory:  Negative for cough and shortness of breath.   Cardiovascular:  Negative for chest pain, palpitations and leg swelling.  Gastrointestinal:  Negative for abdominal distention, abdominal pain, anal bleeding, blood in stool, constipation, diarrhea, nausea, rectal pain and vomiting.  Genitourinary:  Positive for dysuria. Negative for decreased urine volume, difficulty urinating, dyspareunia, flank pain, frequency, hematuria, hesitancy, pelvic pain, urgency, vaginal bleeding, vaginal discharge and vaginal pain.  Musculoskeletal:  Negative for arthralgias and back pain.  Neurological:  Negative for dizziness, tremors, seizures, syncope, facial asymmetry, speech difficulty, weakness, light-headedness, numbness and headaches.  All other systems reviewed and are negative.       Objective:  BP 127/67   Pulse 73   Temp (!) 97.5 F (36.4 C) (Temporal)   Ht 5\' 8"  (1.727 m)   Wt 114 lb 9.6 oz (52 kg)   SpO2 97%   BMI 17.42 kg/m    Wt Readings from Last  3 Encounters:  09/09/22 114 lb 9.6 oz (52 kg)  08/26/22 117 lb (53.1 kg)  08/25/22 116 lb (52.6 kg)    Physical Exam Vitals and nursing note reviewed.  Constitutional:      General: She is not in acute distress.  Appearance: Normal appearance. She is not ill-appearing, toxic-appearing or diaphoretic.  HENT:     Mouth/Throat:     Mouth: Mucous membranes are moist.  Eyes:     Pupils: Pupils are equal, round, and reactive to light.  Cardiovascular:     Rate and Rhythm: Normal rate and regular rhythm.     Heart sounds: Normal heart sounds.  Pulmonary:     Effort: Pulmonary effort is normal.     Breath sounds: Normal breath sounds.  Abdominal:     General: Bowel sounds are normal.     Tenderness: There is no abdominal tenderness. There is no right CVA tenderness or left CVA tenderness.  Skin:    General: Skin is warm and dry.     Capillary Refill: Capillary refill takes less than 2 seconds.  Neurological:     General: No focal deficit present.     Mental Status: She is alert and oriented to person, place, and time.  Psychiatric:        Mood and Affect: Mood normal.        Behavior: Behavior normal.        Thought Content: Thought content normal.        Judgment: Judgment normal.     Results for orders placed or performed in visit on 08/25/22  Microscopic Examination   Urine  Result Value Ref Range   WBC, UA None seen 0 - 5 /hpf   RBC, Urine None seen 0 - 2 /hpf   Epithelial Cells (non renal) 0-10 0 - 10 /hpf   Bacteria, UA None seen None seen/Few  Urine Culture   UR  Result Value Ref Range   Urine Culture, Routine Final report    Organism ID, Bacteria Comment   Urinalysis, Complete  Result Value Ref Range   Specific Gravity, UA 1.015 1.005 - 1.030   pH, UA 7.5 5.0 - 7.5   Color, UA Yellow Yellow   Appearance Ur Clear Clear   Leukocytes,UA Negative Negative   Protein,UA Negative Negative/Trace   Glucose, UA Negative Negative   Ketones, UA Negative Negative    RBC, UA Negative Negative   Bilirubin, UA Negative Negative   Urobilinogen, Ur 0.2 0.2 - 1.0 mg/dL   Nitrite, UA Negative Negative   Microscopic Examination See below:   CMP14+EGFR  Result Value Ref Range   Glucose 85 70 - 99 mg/dL   BUN 10 8 - 27 mg/dL   Creatinine, Ser 1.61 0.57 - 1.00 mg/dL   eGFR 89 >09 UE/AVW/0.98   BUN/Creatinine Ratio 14 12 - 28   Sodium 136 134 - 144 mmol/L   Potassium 4.3 3.5 - 5.2 mmol/L   Chloride 100 96 - 106 mmol/L   CO2 23 20 - 29 mmol/L   Calcium 9.6 8.7 - 10.3 mg/dL   Total Protein 6.8 6.0 - 8.5 g/dL   Albumin 4.6 3.8 - 4.8 g/dL   Globulin, Total 2.2 1.5 - 4.5 g/dL   Albumin/Globulin Ratio 2.1 1.2 - 2.2   Bilirubin Total 0.5 0.0 - 1.2 mg/dL   Alkaline Phosphatase 43 (L) 44 - 121 IU/L   AST 20 0 - 40 IU/L   ALT 16 0 - 32 IU/L  CBC with Differential/Platelet  Result Value Ref Range   WBC 5.8 3.4 - 10.8 x10E3/uL   RBC 3.57 (L) 3.77 - 5.28 x10E6/uL   Hemoglobin 10.9 (L) 11.1 - 15.9 g/dL   Hematocrit 11.9 14.7 - 46.6 %   MCV 96 79 -  97 fL   MCH 30.5 26.6 - 33.0 pg   MCHC 31.8 31.5 - 35.7 g/dL   RDW 16.1 (L) 09.6 - 04.5 %   Platelets 221 150 - 450 x10E3/uL   Neutrophils 70 Not Estab. %   Lymphs 19 Not Estab. %   Monocytes 11 Not Estab. %   Eos 0 Not Estab. %   Basos 0 Not Estab. %   Neutrophils Absolute 4.0 1.4 - 7.0 x10E3/uL   Lymphocytes Absolute 1.1 0.7 - 3.1 x10E3/uL   Monocytes Absolute 0.6 0.1 - 0.9 x10E3/uL   EOS (ABSOLUTE) 0.0 0.0 - 0.4 x10E3/uL   Basophils Absolute 0.0 0.0 - 0.2 x10E3/uL   Immature Granulocytes 0 Not Estab. %   Immature Grans (Abs) 0.0 0.0 - 0.1 x10E3/uL   *Note: Due to a large number of results and/or encounters for the requested time period, some results have not been displayed. A complete set of results can be found in Results Review.       Pertinent labs & imaging results that were available during my care of the patient were reviewed by me and considered in my medical decision making.  Assessment & Plan:   Cassiah was seen today for dysuria.  Diagnoses and all orders for this visit:  Dysuria -     Urinalysis, Routine w reflex microscopic -     Urine Culture  Acute cystitis without hematuria Urinalysis positive for nitrites and bacteria in office. Previous culture reviewed. No red flags concerning for acute pyelonephritis. Will treat with below. Pt aware to report new, worsening, or persistent symptoms.  -     fosfomycin (MONUROL) 3 g PACK; Take 3 g by mouth once for 1 dose.     Continue all other maintenance medications.  Follow up plan: Return if symptoms worsen or fail to improve.   Continue healthy lifestyle choices, including diet (rich in fruits, vegetables, and lean proteins, and low in salt and simple carbohydrates) and exercise (at least 30 minutes of moderate physical activity daily).   The above assessment and management plan was discussed with the patient. The patient verbalized understanding of and has agreed to the management plan. Patient is aware to call the clinic if they develop any new symptoms or if symptoms persist or worsen. Patient is aware when to return to the clinic for a follow-up visit. Patient educated on when it is appropriate to go to the emergency department.   Kari Baars, FNP-C [facility] [phone]

## 2022-09-09 NOTE — Telephone Encounter (Signed)
Patient aware and verbalizes understanding. 

## 2022-09-11 LAB — URINE CULTURE

## 2022-09-17 DIAGNOSIS — N390 Urinary tract infection, site not specified: Secondary | ICD-10-CM | POA: Diagnosis not present

## 2022-09-21 DIAGNOSIS — K581 Irritable bowel syndrome with constipation: Secondary | ICD-10-CM | POA: Diagnosis not present

## 2022-09-22 DIAGNOSIS — N3010 Interstitial cystitis (chronic) without hematuria: Secondary | ICD-10-CM | POA: Diagnosis not present

## 2022-09-22 DIAGNOSIS — N3020 Other chronic cystitis without hematuria: Secondary | ICD-10-CM | POA: Diagnosis not present

## 2022-09-22 DIAGNOSIS — R300 Dysuria: Secondary | ICD-10-CM | POA: Diagnosis not present

## 2022-09-30 ENCOUNTER — Other Ambulatory Visit: Payer: Self-pay | Admitting: Family Medicine

## 2022-09-30 DIAGNOSIS — J4540 Moderate persistent asthma, uncomplicated: Secondary | ICD-10-CM

## 2022-10-06 DIAGNOSIS — R8279 Other abnormal findings on microbiological examination of urine: Secondary | ICD-10-CM | POA: Diagnosis not present

## 2022-10-06 DIAGNOSIS — N3020 Other chronic cystitis without hematuria: Secondary | ICD-10-CM | POA: Diagnosis not present

## 2022-10-11 DIAGNOSIS — M9902 Segmental and somatic dysfunction of thoracic region: Secondary | ICD-10-CM | POA: Diagnosis not present

## 2022-10-11 DIAGNOSIS — M9903 Segmental and somatic dysfunction of lumbar region: Secondary | ICD-10-CM | POA: Diagnosis not present

## 2022-10-11 DIAGNOSIS — M5137 Other intervertebral disc degeneration, lumbosacral region: Secondary | ICD-10-CM | POA: Diagnosis not present

## 2022-10-11 DIAGNOSIS — M9901 Segmental and somatic dysfunction of cervical region: Secondary | ICD-10-CM | POA: Diagnosis not present

## 2022-10-12 ENCOUNTER — Ambulatory Visit (INDEPENDENT_AMBULATORY_CARE_PROVIDER_SITE_OTHER): Payer: Medicare HMO | Admitting: Family Medicine

## 2022-10-12 ENCOUNTER — Other Ambulatory Visit: Payer: Self-pay

## 2022-10-12 ENCOUNTER — Encounter: Payer: Self-pay | Admitting: Family Medicine

## 2022-10-12 VITALS — BP 108/64 | HR 80 | Temp 97.9°F | Ht 68.0 in | Wt 115.8 lb

## 2022-10-12 DIAGNOSIS — N3090 Cystitis, unspecified without hematuria: Secondary | ICD-10-CM

## 2022-10-12 DIAGNOSIS — R300 Dysuria: Secondary | ICD-10-CM | POA: Diagnosis not present

## 2022-10-12 LAB — URINALYSIS, COMPLETE
Bilirubin, UA: NEGATIVE
Glucose, UA: NEGATIVE
Ketones, UA: NEGATIVE
Leukocytes,UA: NEGATIVE
Nitrite, UA: POSITIVE — AB
Protein,UA: NEGATIVE
RBC, UA: NEGATIVE
Specific Gravity, UA: 1.01 (ref 1.005–1.030)
Urobilinogen, Ur: 0.2 mg/dL (ref 0.2–1.0)
pH, UA: 6.5 (ref 5.0–7.5)

## 2022-10-12 LAB — MICROSCOPIC EXAMINATION
Bacteria, UA: NONE SEEN
Epithelial Cells (non renal): NONE SEEN /hpf (ref 0–10)
RBC, Urine: NONE SEEN /hpf (ref 0–2)
Renal Epithel, UA: NONE SEEN /hpf
WBC, UA: NONE SEEN /hpf (ref 0–5)

## 2022-10-12 MED ORDER — BUTALBITAL-APAP-CAFFEINE 50-325-40 MG PO TABS
1.0000 | ORAL_TABLET | Freq: Four times a day (QID) | ORAL | 0 refills | Status: DC | PRN
Start: 2022-10-12 — End: 2023-08-29

## 2022-10-12 MED ORDER — FOSFOMYCIN TROMETHAMINE 3 G PO PACK
3.0000 g | PACK | Freq: Once | ORAL | 0 refills | Status: AC
Start: 2022-10-12 — End: 2022-10-12

## 2022-10-12 MED ORDER — PHENAZOPYRIDINE HCL 200 MG PO TABS
200.0000 mg | ORAL_TABLET | Freq: Four times a day (QID) | ORAL | 0 refills | Status: DC | PRN
Start: 2022-10-12 — End: 2023-08-29

## 2022-10-12 NOTE — Progress Notes (Signed)
Subjective:  Patient ID: Kristy Garza, female    DOB: 03/07/1951  Age: 72 y.o. MRN: [id number]  CC: Dysuria and Headache   HPI SABA GOMM presents for dysuria for a week. Has interstitial cystitis. Nitirite was positive at last week's check by urology.  Headache in forehead Skin at brow feels tight.      10/12/2022   11:38 AM 08/25/2022    8:17 AM 08/19/2022    7:58 AM  Depression screen PHQ 2/9  Decreased Interest 0 0 0  Down, Depressed, Hopeless 0 0 0  PHQ - 2 Score 0 0 0  Altered sleeping   0  Tired, decreased energy   0  Change in appetite   0  Feeling bad or failure about yourself    0  Trouble concentrating   0  Moving slowly or fidgety/restless   0  Suicidal thoughts   0  PHQ-9 Score   0  Difficult doing work/chores   Not difficult at all    History Hanh has a past medical history of Abdominal pain (01/16/2013), Acid reflux, Allergy, Arthritis, Asthma, Interstitial cystitis, Osteoporosis, PONV (postoperative nausea and vomiting), Tinnitus, and Trigeminal neuralgia.   She has a past surgical history that includes Abdominal hysterectomy (2000); vocal cord surgery  (7829'F); Ethmoidectomy (2012); Septoplasty (1980's); and Cystoscopy with hydrodistension and biopsy (N/A, 06/11/2012).   Her family history includes Allergies in her father, sister, and sister; Arthritis in her mother; Cancer in her father and mother; Hyperlipidemia in her mother; Lymphoma in her mother.She reports that she has never smoked. She has never used smokeless tobacco. She reports that she does not drink alcohol and does not use drugs.    ROS Review of Systems  Constitutional:  Negative for chills, diaphoresis and fever.  HENT:  Negative for congestion.   Eyes:  Negative for visual disturbance.  Respiratory:  Negative for cough and shortness of breath.   Cardiovascular:  Negative for chest pain and palpitations.  Gastrointestinal:  Negative for constipation, diarrhea and nausea.   Genitourinary:  Positive for dysuria, frequency and urgency. Negative for decreased urine volume, flank pain, hematuria, menstrual problem and pelvic pain.  Musculoskeletal:  Negative for arthralgias and joint swelling.  Skin:  Negative for rash.  Neurological:  Negative for dizziness and numbness.    Objective:  BP 108/64   Pulse 80   Temp 97.9 F (36.6 C)   Ht 5\' 8"  (1.727 m)   Wt 115 lb 12.8 oz (52.5 kg)   SpO2 97%   BMI 17.61 kg/m   BP Readings from Last 3 Encounters:  10/12/22 108/64  09/09/22 127/67  08/26/22 102/68    Wt Readings from Last 3 Encounters:  10/12/22 115 lb 12.8 oz (52.5 kg)  09/09/22 114 lb 9.6 oz (52 kg)  08/26/22 117 lb (53.1 kg)     Physical Exam Constitutional:      Appearance: She is well-developed.  HENT:     Head: Normocephalic and atraumatic.  Cardiovascular:     Rate and Rhythm: Normal rate and regular rhythm.     Heart sounds: No murmur heard. Pulmonary:     Effort: Pulmonary effort is normal.     Breath sounds: Normal breath sounds.  Abdominal:     General: Bowel sounds are normal.     Palpations: Abdomen is soft. There is no mass.     Tenderness: There is no abdominal tenderness. There is no guarding or rebound.  Musculoskeletal:  General: No tenderness.  Skin:    General: Skin is warm and dry.  Neurological:     Mental Status: She is alert and oriented to person, place, and time.  Psychiatric:        Behavior: Behavior normal.       Assessment & Plan:   Kayren was seen today for dysuria and headache.  Diagnoses and all orders for this visit:  Cystitis -     Urinalysis, Complete -     Urine Culture -     phenazopyridine (PYRIDIUM) 200 MG tablet; Take 1 tablet (200 mg total) by mouth 4 (four) times daily as needed for pain (urinary frequency &/or pain). -     fosfomycin (MONUROL) 3 g PACK; Take 3 g by mouth once for 1 dose. -     butalbital-acetaminophen-caffeine (FIORICET) 50-325-40 MG tablet; Take 1 tablet  by mouth every 6 (six) hours as needed for headache. -     Microscopic Examination  Dysuria -     Urinalysis, Complete -     Urine Culture       I am having Bo Merino. Mountz start on phenazopyridine, fosfomycin, and butalbital-acetaminophen-caffeine. I am also having her maintain her b complex vitamins, acetaminophen, Simethicone (GAS-X PO), Probiotic Product (PROBIOTIC PO), UNABLE TO FIND, Polyethylene Glycol 3350 (MIRALAX PO), lidocaine, calcium citrate-vitamin D, aspirin-acetaminophen-caffeine, melatonin, ascorbic acid, hyoscyamine, Vitamin D3, Regenecare, hydrOXYzine, mirtazapine, Uro-MP, ferrous sulfate, sennosides-docusate sodium, olopatadine, famotidine, praziquantel, albuterol, Arnuity Ellipta, Ryaltris, traMADol, tizanidine, and NALTREXONE HCL PO. We will continue to administer zoledronic acid.  Allergies as of 10/12/2022       Reactions   Cefuroxime Axetil Other (See Comments)   Extreme gas   Gabapentin Other (See Comments)   Constipation Constipation   Montelukast Other (See Comments)   Numbness and tingling in hand. Numbness and tingling in hand. Numbness and tingling in hand. NUMBNESS OF HANDS AND FEET. Numbness in hands and feet NUMBNESS OF HANDS AND FEET.   Pregabalin Other (See Comments)   Drowsiness, dry mouth Drowsiness, dry mouth Makes her tired and thirsty   Augmentin [amoxicillin-pot Clavulanate] Diarrhea   Avelox [moxifloxacin Hcl In Nacl] Other (See Comments)   Tremors   Biaxin [clarithromycin]    Unsure of reaction   Cedax [ceftibuten] Other (See Comments)   tremors   Ciprofloxacin Other (See Comments), Nausea And Vomiting   Blurred vision Blurred vision Pt can't remember reaction   Clindamycin/lincomycin    tachycardia   Doxycycline Diarrhea   Nausea , stomach upset   Levofloxacin Other (See Comments)   Feels dehydrated   Lincomycin Other (See Comments)   tachycardia   Montelukast Sodium Other (See Comments)   Numbness and tingling in  hand. Numbness and tingling in hand.   Sulfonamide Derivatives Other (See Comments)   Gi upset   Sulfa Antibiotics Nausea Only   Other reaction(s): Other (See Comments) Gi upset Other reaction(s): Other (See Comments) Gi upset Other reaction(s): Unknown        Medication List        Accurate as of October 12, 2022  9:43 PM. If you have any questions, ask your nurse or doctor.          acetaminophen 500 MG tablet Commonly known as: TYLENOL Take by mouth.   albuterol 108 (90 Base) MCG/ACT inhaler Commonly known as: VENTOLIN HFA TAKE 2 PUFFS BY MOUTH EVERY 6 HOURS AS NEEDED FOR WHEEZE OR SHORTNESS OF BREATH   Arnuity Ellipta 200 MCG/ACT Aepb Generic drug: Fluticasone  Furoate Inhale 1 puff into the lungs daily.   ascorbic acid 1000 MG tablet Commonly known as: VITAMIN C Take by mouth.   aspirin-acetaminophen-caffeine 250-250-65 MG tablet Commonly known as: EXCEDRIN MIGRAINE Take by mouth.   b complex vitamins capsule Take 1 capsule by mouth daily.   butalbital-acetaminophen-caffeine 50-325-40 MG tablet Commonly known as: FIORICET Take 1 tablet by mouth every 6 (six) hours as needed for headache. Started by: Stephania Macfarlane   calcium citrate-vitamin D 315-200 MG-UNIT tablet Commonly known as: CITRACAL+D Take by mouth.   famotidine 20 MG tablet Commonly known as: PEPCID Take 20 mg by mouth 2 (two) times daily.   ferrous sulfate 325 (65 FE) MG EC tablet Commonly known as: Fe Tabs Take 1 tablet (325 mg total) by mouth daily with breakfast.   fosfomycin 3 g Pack Commonly known as: Monurol Take 3 g by mouth once for 1 dose. Started by: Johnmatthew Solorio   GAS-X PO Take by mouth.   hydrOXYzine 25 MG tablet Commonly known as: ATARAX Take 25 mg by mouth at bedtime.   hyoscyamine 0.125 MG SL tablet Commonly known as: LEVSIN SL Place under the tongue every 4 (four) hours as needed.   lidocaine 2 % jelly Commonly known as: XYLOCAINE APPLY TOPICALLY TO AFFECTED  AREA EVERY DAY AS NEEDED (use sparingly)   melatonin 5 MG Tabs Take 5 mg by mouth.   MIRALAX PO Take by mouth as needed.   mirtazapine 7.5 MG tablet Commonly known as: REMERON Take 7.5 mg by mouth at bedtime.   NALTREXONE HCL PO Take by mouth.   olopatadine 0.1 % ophthalmic solution Commonly known as: PATANOL Place 1 drop into both eyes 2 (two) times daily as needed for allergies.   phenazopyridine 200 MG tablet Commonly known as: Pyridium Take 1 tablet (200 mg total) by mouth 4 (four) times daily as needed for pain (urinary frequency &/or pain). Started by: Denson Niccoli   praziquantel 600 MG tablet Commonly known as: BILTRICIDE Take 600 mg by mouth 3 (three) times daily.   PROBIOTIC PO Take by mouth daily.   Regenecare 2 % Gel Generic drug: Lidocaine-Collagen-Aloe Vera Apply topically daily.   Ryaltris 161-09 MCG/ACT Susp Generic drug: Olopatadine-Mometasone Place 1-2 sprays into the nose in the morning and at bedtime.   sennosides-docusate sodium 8.6-50 MG tablet Commonly known as: SENOKOT-S Take 1 tablet by mouth daily.   tizanidine 2 MG capsule Commonly known as: ZANAFLEX Take 1-2 capsules (2-4 mg total) by mouth 3 (three) times daily as needed for muscle spasms.   traMADol 50 MG tablet Commonly known as: ULTRAM Take 1 tablet (50 mg total) by mouth every 12 (twelve) hours as needed.   UNABLE TO FIND Take 1 capsule by mouth daily. Dv3- vitamin D 3 plus immune support   Uro-MP 118 MG Caps Take 1 capsule by mouth 4 (four) times daily as needed.   Vitamin D3 125 MCG (5000 UT) Caps Take 5,000 Units by mouth daily.         Follow-up: Return if symptoms worsen or fail to improve.  Mechele Claude, M.D.

## 2022-10-14 LAB — URINE CULTURE

## 2022-10-18 ENCOUNTER — Telehealth: Payer: Self-pay | Admitting: Family Medicine

## 2022-10-18 NOTE — Telephone Encounter (Signed)
PATIENT AWARE

## 2022-10-18 NOTE — Telephone Encounter (Signed)
Pt wants Dr Darlyn Read or nurse to call her to review urine culture results with her. Says no one has talked with her about it. Was supposed to send off to check bacteria in urine.

## 2022-10-19 ENCOUNTER — Ambulatory Visit (INDEPENDENT_AMBULATORY_CARE_PROVIDER_SITE_OTHER): Payer: Medicare HMO | Admitting: Family Medicine

## 2022-10-19 ENCOUNTER — Encounter: Payer: Self-pay | Admitting: Family Medicine

## 2022-10-19 VITALS — BP 129/82 | HR 78 | Temp 98.9°F | Ht 68.0 in | Wt 118.0 lb

## 2022-10-19 DIAGNOSIS — N3010 Interstitial cystitis (chronic) without hematuria: Secondary | ICD-10-CM | POA: Diagnosis not present

## 2022-10-19 DIAGNOSIS — R899 Unspecified abnormal finding in specimens from other organs, systems and tissues: Secondary | ICD-10-CM | POA: Diagnosis not present

## 2022-10-19 DIAGNOSIS — R590 Localized enlarged lymph nodes: Secondary | ICD-10-CM | POA: Diagnosis not present

## 2022-10-19 DIAGNOSIS — R599 Enlarged lymph nodes, unspecified: Secondary | ICD-10-CM | POA: Diagnosis not present

## 2022-10-19 NOTE — Progress Notes (Signed)
Acute Office Visit  Subjective:  Patient ID: Kristy Garza, female    DOB: October 21, 1950, 72 y.o.   MRN: [id number]  Chief Complaint  Patient presents with   swollen neck    Left side   HPI Patient is in today for swollen area over neck/clavicle region. States that she noticed it about one hour ago. Denies pain, but endorses "tightness" at the site. Reports that she received a Toradol injection in her hip earlier today at urology appt.  When she woke up this morning at 3:30 am she had a headache and took excedrin. She is concerned about this site because her mother had NHL. She endorses symptoms of fatigue for 2 months and lower leg pain for 2 months. She is using lidocaine patches on her legs, which is helping some.   ROS As per HPI  Objective:  BP 129/82   Pulse 78   Temp 98.9 F (37.2 C)   Ht 5\' 8"  (1.727 m)   Wt 118 lb (53.5 kg)   SpO2 94%   BMI 17.94 kg/m   Physical Exam Constitutional:      General: She is awake. She is not in acute distress.    Appearance: Normal appearance. She is well-developed, well-groomed and underweight. She is not ill-appearing, toxic-appearing or diaphoretic.  Cardiovascular:     Rate and Rhythm: Normal rate.     Pulses: Normal pulses.          Radial pulses are 2+ on the right side and 2+ on the left side.       Posterior tibial pulses are 2+ on the right side and 2+ on the left side.     Heart sounds: Normal heart sounds. No murmur heard.    No gallop.  Pulmonary:     Effort: Pulmonary effort is normal. No respiratory distress.     Breath sounds: Normal breath sounds. No stridor. No wheezing, rhonchi or rales.  Musculoskeletal:     Cervical back: Full passive range of motion without pain and neck supple.     Right lower leg: No edema.     Left lower leg: No edema.  Lymphadenopathy:     Upper Body:     Left upper body: Supraclavicular adenopathy present.     Comments: ~3cm soft, raised supraclavicular lymph node nontender to palpation   Skin:    General: Skin is warm.     Capillary Refill: Capillary refill takes less than 2 seconds.  Neurological:     General: No focal deficit present.     Mental Status: She is alert, oriented to person, place, and time and easily aroused. Mental status is at baseline.     GCS: GCS eye subscore is 4. GCS verbal subscore is 5. GCS motor subscore is 6.     Motor: No weakness.  Psychiatric:        Attention and Perception: Attention and perception normal.        Mood and Affect: Mood and affect normal.        Speech: Speech normal.        Behavior: Behavior normal. Behavior is cooperative.        Thought Content: Thought content normal. Thought content does not include homicidal or suicidal ideation. Thought content does not include homicidal or suicidal plan.        Cognition and Memory: Cognition and memory normal.        Judgment: Judgment normal.  10/19/2022    3:59 PM 10/12/2022   11:38 AM 08/25/2022    8:17 AM  Depression screen PHQ 2/9  Decreased Interest 0 0 0  Down, Depressed, Hopeless 0 0 0  PHQ - 2 Score 0 0 0  Altered sleeping 0    Tired, decreased energy 0    Change in appetite 0    Feeling bad or failure about yourself  0    Trouble concentrating 0    Moving slowly or fidgety/restless 0    Suicidal thoughts 0    PHQ-9 Score 0    Difficult doing work/chores Not difficult at all        10/19/2022    4:00 PM 08/19/2022    7:58 AM 06/27/2022   10:31 AM 05/06/2022   10:09 AM  GAD 7 : Generalized Anxiety Score  Nervous, Anxious, on Edge 0 0 0 0  Control/stop worrying 0 0 0 0  Worry too much - different things 0 0 0 0  Trouble relaxing 0 0 0 0  Restless 0 0 0 0  Easily annoyed or irritable 0 0 0 0  Afraid - awful might happen 0 0 0 0  Total GAD 7 Score 0 0 0 0  Anxiety Difficulty Not difficult at all Not difficult at all Not difficult at all Not difficult at all    Assessment & Plan:  1. Supraclavicular adenopathy Labs and imaging as below. Will  communicate results to patient once available. Will await results to determine next steps.  - CMP14+EGFR - US Soft Tissue Head/Neck (NON-THYROID); Future - Pathologist smear review - Lactate Dehydrogenase  The above assessment and management plan was discussed with the patient. The patient verbalized understanding of and has agreed to the management plan using shared-decision making. Patient is aware to call the clinic if they develop any new symptoms or if symptoms fail to improve or worsen. Patient is aware when to return to the clinic for a follow-up visit. Patient educated on when it is appropriate to go to the emergency department.   Return if symptoms worsen or fail to improve.  Neale Burly, DNP-FNP [facility] [address] [place], [place] [address] [phone]

## 2022-10-20 LAB — PATHOLOGIST SMEAR REVIEW
Basos: 1 %
EOS (ABSOLUTE): 0.1 10*3/uL (ref 0.0–0.4)
Hematocrit: 32.8 % — ABNORMAL LOW (ref 34.0–46.6)
Immature Grans (Abs): 0 10*3/uL (ref 0.0–0.1)
Immature Granulocytes: 0 %
MCHC: 32.9 g/dL (ref 31.5–35.7)
MCV: 95 fL (ref 79–97)
Monocytes Absolute: 0.4 10*3/uL (ref 0.1–0.9)
Monocytes: 10 %
Neutrophils: 68 %
Platelets: 198 10*3/uL (ref 150–450)
RBC: 3.45 x10E6/uL — ABNORMAL LOW (ref 3.77–5.28)
RDW: 11.5 % — ABNORMAL LOW (ref 11.7–15.4)
WBC: 4.6 10*3/uL (ref 3.4–10.8)

## 2022-10-20 LAB — CMP14+EGFR
ALT: 14 IU/L (ref 0–32)
AST: 25 IU/L (ref 0–40)
Albumin: 4.6 g/dL (ref 3.8–4.8)
BUN: 14 mg/dL (ref 8–27)
Bilirubin Total: 0.3 mg/dL (ref 0.0–1.2)
CO2: 22 mmol/L (ref 20–29)
Calcium: 9.5 mg/dL (ref 8.7–10.3)
Creatinine, Ser: 0.83 mg/dL (ref 0.57–1.00)
Globulin, Total: 1.8 g/dL (ref 1.5–4.5)
Glucose: 109 mg/dL — ABNORMAL HIGH (ref 70–99)
Sodium: 130 mmol/L — ABNORMAL LOW (ref 134–144)
Total Protein: 6.4 g/dL (ref 6.0–8.5)
eGFR: 75 mL/min/{1.73_m2} (ref >59)

## 2022-10-20 LAB — LACTATE DEHYDROGENASE: LDH: 164 IU/L (ref 119–226)

## 2022-10-26 DIAGNOSIS — M9903 Segmental and somatic dysfunction of lumbar region: Secondary | ICD-10-CM | POA: Diagnosis not present

## 2022-10-26 DIAGNOSIS — M9902 Segmental and somatic dysfunction of thoracic region: Secondary | ICD-10-CM | POA: Diagnosis not present

## 2022-10-26 DIAGNOSIS — M9901 Segmental and somatic dysfunction of cervical region: Secondary | ICD-10-CM | POA: Diagnosis not present

## 2022-10-26 DIAGNOSIS — M5137 Other intervertebral disc degeneration, lumbosacral region: Secondary | ICD-10-CM | POA: Diagnosis not present

## 2022-10-26 NOTE — Progress Notes (Signed)
Pathologist smear indicates normal-sized cell anemia and LDH is normal, which is reassuring. Will await results of [name]. Anemia is stable. Sodium was slightly low, would like patient to repeat in one week. Please have her schedule lab appt. Any symptoms of Confusion, irritability, restlessness, fatigue, headache, nausea, vomiting, loss of appetite, muscle weakness, spasms, or cramps recommend patient follow up more urgently.

## 2022-10-26 NOTE — Telephone Encounter (Signed)
Refer to lab result  

## 2022-10-26 NOTE — Telephone Encounter (Signed)
Pt called requesting to speak with nurse to discuss results.

## 2022-10-26 NOTE — Addendum Note (Signed)
Addended by: Neale Burly on: 10/26/2022 09:49 AM   Modules accepted: Orders

## 2022-10-28 ENCOUNTER — Ambulatory Visit (HOSPITAL_COMMUNITY)
Admission: RE | Admit: 2022-10-28 | Discharge: 2022-10-28 | Disposition: A | Discharge status: Home / Self Care | Payer: Medicare HMO | Source: Ambulatory Visit | Attending: Family Medicine | Admitting: Family Medicine

## 2022-10-28 DIAGNOSIS — N3010 Interstitial cystitis (chronic) without hematuria: Secondary | ICD-10-CM | POA: Diagnosis not present

## 2022-10-28 DIAGNOSIS — R590 Localized enlarged lymph nodes: Secondary | ICD-10-CM | POA: Diagnosis not present

## 2022-10-28 DIAGNOSIS — R599 Enlarged lymph nodes, unspecified: Secondary | ICD-10-CM | POA: Diagnosis not present

## 2022-10-28 DIAGNOSIS — Z681 Body mass index (BMI) 19 or less, adult: Secondary | ICD-10-CM | POA: Diagnosis not present

## 2022-10-28 DIAGNOSIS — N390 Urinary tract infection, site not specified: Secondary | ICD-10-CM | POA: Diagnosis not present

## 2022-10-31 ENCOUNTER — Other Ambulatory Visit: Payer: Medicare HMO

## 2022-10-31 DIAGNOSIS — R899 Unspecified abnormal finding in specimens from other organs, systems and tissues: Secondary | ICD-10-CM | POA: Diagnosis not present

## 2022-10-31 LAB — CMP14+EGFR
ALT: 17 IU/L (ref 0–32)
AST: 23 IU/L (ref 0–40)
Albumin: 4.4 g/dL (ref 3.8–4.8)
Alkaline Phosphatase: 42 IU/L — ABNORMAL LOW (ref 44–121)
BUN/Creatinine Ratio: 15 (ref 12–28)
BUN: 12 mg/dL (ref 8–27)
Bilirubin Total: 0.3 mg/dL (ref 0.0–1.2)
CO2: 24 mmol/L (ref 20–29)
Calcium: 9.5 mg/dL (ref 8.7–10.3)
Chloride: 101 mmol/L (ref 96–106)
Creatinine, Ser: 0.8 mg/dL (ref 0.57–1.00)
Globulin, Total: 2 g/dL (ref 1.5–4.5)
Glucose: 85 mg/dL (ref 70–99)
Potassium: 4.8 mmol/L (ref 3.5–5.2)
Sodium: 138 mmol/L (ref 134–144)
Total Protein: 6.4 g/dL (ref 6.0–8.5)
eGFR: 79 mL/min/{1.73_m2} (ref >59)

## 2022-11-01 NOTE — Progress Notes (Signed)
Alk phos slightly decreased, but stable. Sodium normalized.

## 2022-11-07 ENCOUNTER — Telehealth: Payer: Self-pay | Admitting: Family Medicine

## 2022-11-07 NOTE — Telephone Encounter (Signed)
Patient calling to check on result from [name] that was done on 10/28/22, please call back and advise.

## 2022-11-07 NOTE — Telephone Encounter (Signed)
Please review and advise.

## 2022-11-08 ENCOUNTER — Telehealth: Payer: Self-pay | Admitting: Family Medicine

## 2022-11-08 NOTE — Telephone Encounter (Signed)
Attempted to call pt 2x phone would answer and hangup right after

## 2022-11-08 NOTE — Progress Notes (Signed)
See other note. Normal [name], recommend patient follow up if symptoms continue.

## 2022-11-08 NOTE — Telephone Encounter (Signed)
Seems pt does not have good service or phone isnt working - pt seems to be answering but can not hear anything once it is picked up

## 2022-11-10 DIAGNOSIS — M9903 Segmental and somatic dysfunction of lumbar region: Secondary | ICD-10-CM | POA: Diagnosis not present

## 2022-11-10 DIAGNOSIS — M5137 Other intervertebral disc degeneration, lumbosacral region: Secondary | ICD-10-CM | POA: Diagnosis not present

## 2022-11-10 DIAGNOSIS — M9902 Segmental and somatic dysfunction of thoracic region: Secondary | ICD-10-CM | POA: Diagnosis not present

## 2022-11-10 DIAGNOSIS — M9901 Segmental and somatic dysfunction of cervical region: Secondary | ICD-10-CM | POA: Diagnosis not present

## 2022-11-21 NOTE — Telephone Encounter (Signed)
NA °No call back  °This encounter will be closed  °

## 2022-11-24 DIAGNOSIS — M9901 Segmental and somatic dysfunction of cervical region: Secondary | ICD-10-CM | POA: Diagnosis not present

## 2022-11-24 DIAGNOSIS — M5137 Other intervertebral disc degeneration, lumbosacral region: Secondary | ICD-10-CM | POA: Diagnosis not present

## 2022-11-24 DIAGNOSIS — M9902 Segmental and somatic dysfunction of thoracic region: Secondary | ICD-10-CM | POA: Diagnosis not present

## 2022-11-24 DIAGNOSIS — M9903 Segmental and somatic dysfunction of lumbar region: Secondary | ICD-10-CM | POA: Diagnosis not present

## 2022-12-07 DIAGNOSIS — M9901 Segmental and somatic dysfunction of cervical region: Secondary | ICD-10-CM | POA: Diagnosis not present

## 2022-12-07 DIAGNOSIS — M9903 Segmental and somatic dysfunction of lumbar region: Secondary | ICD-10-CM | POA: Diagnosis not present

## 2022-12-07 DIAGNOSIS — M5137 Other intervertebral disc degeneration, lumbosacral region: Secondary | ICD-10-CM | POA: Diagnosis not present

## 2022-12-07 DIAGNOSIS — M9902 Segmental and somatic dysfunction of thoracic region: Secondary | ICD-10-CM | POA: Diagnosis not present

## 2022-12-21 DIAGNOSIS — M5137 Other intervertebral disc degeneration, lumbosacral region: Secondary | ICD-10-CM | POA: Diagnosis not present

## 2022-12-21 DIAGNOSIS — M9902 Segmental and somatic dysfunction of thoracic region: Secondary | ICD-10-CM | POA: Diagnosis not present

## 2022-12-21 DIAGNOSIS — M9903 Segmental and somatic dysfunction of lumbar region: Secondary | ICD-10-CM | POA: Diagnosis not present

## 2022-12-21 DIAGNOSIS — M9901 Segmental and somatic dysfunction of cervical region: Secondary | ICD-10-CM | POA: Diagnosis not present

## 2022-12-27 ENCOUNTER — Encounter: Payer: Self-pay | Admitting: Family Medicine

## 2022-12-27 ENCOUNTER — Ambulatory Visit (INDEPENDENT_AMBULATORY_CARE_PROVIDER_SITE_OTHER): Payer: Medicare HMO | Admitting: Family Medicine

## 2022-12-27 ENCOUNTER — Ambulatory Visit (INDEPENDENT_AMBULATORY_CARE_PROVIDER_SITE_OTHER): Payer: Medicare HMO

## 2022-12-27 VITALS — BP 109/65 | HR 80 | Temp 98.9°F | Ht 68.0 in | Wt 114.2 lb

## 2022-12-27 DIAGNOSIS — M545 Low back pain, unspecified: Secondary | ICD-10-CM | POA: Diagnosis not present

## 2022-12-27 DIAGNOSIS — Z681 Body mass index (BMI) 19 or less, adult: Secondary | ICD-10-CM

## 2022-12-27 DIAGNOSIS — E65 Localized adiposity: Secondary | ICD-10-CM | POA: Diagnosis not present

## 2022-12-27 DIAGNOSIS — R636 Underweight: Secondary | ICD-10-CM

## 2022-12-27 DIAGNOSIS — R5382 Chronic fatigue, unspecified: Secondary | ICD-10-CM | POA: Diagnosis not present

## 2022-12-27 DIAGNOSIS — K581 Irritable bowel syndrome with constipation: Secondary | ICD-10-CM

## 2022-12-27 DIAGNOSIS — M79605 Pain in left leg: Secondary | ICD-10-CM | POA: Diagnosis not present

## 2022-12-27 DIAGNOSIS — G479 Sleep disorder, unspecified: Secondary | ICD-10-CM | POA: Diagnosis not present

## 2022-12-27 DIAGNOSIS — M810 Age-related osteoporosis without current pathological fracture: Secondary | ICD-10-CM | POA: Diagnosis not present

## 2022-12-27 DIAGNOSIS — Z23 Encounter for immunization: Secondary | ICD-10-CM

## 2022-12-27 NOTE — Patient Instructions (Signed)

## 2022-12-27 NOTE — Progress Notes (Signed)
Subjective: CC: Fatigue  PCP: Kristy Ip, DO NWG:NFAOZH [name] is a 72 y.o. female presenting to clinic today for:  1.  Chronic fatigue Patient reports that she has been having some fatigue, more often than not when she is exerting herself.  She admits that she is not especially physically active and noticed some increased fatigue after exertion uphill.  She reports no swelling.  She admits that sometimes she does not sleep well but uses melatonin and this seems to help.  She reports chronic constipation that is relieved by prunes.  She has discontinued MiraLAX at the recommendation of her gastroenterologist.  1 night she used a small amount of MiraLAX and senna and this resulted in a bowel movement during her sleep.  She has since discontinued that regimen.  She reports no abdominal pain, nausea, vomiting or blood in stool.  2.  Low back pain Patient reports a low back pain around the SI joint on the left.  This sometimes does radiate down the left thigh.  She reports no instability but notes that things like bending to garden seem to aggravate this area.  This area was improved after some type of injection with integrative specialists.  She has not yet contacted them back to get this repeated   ROS: Per HPI  Allergies  Allergen Reactions   Cefuroxime Axetil Other (See Comments)    Extreme gas   Gabapentin Other (See Comments)    Constipation Constipation   Montelukast Other (See Comments)    Numbness and tingling in hand. Numbness and tingling in hand. Numbness and tingling in hand. NUMBNESS OF HANDS AND FEET. Numbness in hands and feet NUMBNESS OF HANDS AND FEET.   Pregabalin Other (See Comments)    Drowsiness, dry mouth Drowsiness, dry mouth Makes her tired and thirsty   Augmentin [Amoxicillin-Pot Clavulanate] Diarrhea   Avelox [Moxifloxacin Hcl In Nacl] Other (See Comments)    Tremors    Biaxin [Clarithromycin]     Unsure of reaction   Cedax [Ceftibuten]  Other (See Comments)    tremors   Ciprofloxacin Other (See Comments) and Nausea And Vomiting    Blurred vision Blurred vision Pt can't remember reaction   Clindamycin/Lincomycin     tachycardia   Doxycycline Diarrhea    Nausea , stomach upset   Levofloxacin Other (See Comments)    Feels dehydrated   Lincomycin Other (See Comments)    tachycardia   Montelukast Sodium Other (See Comments)    Numbness and tingling in hand. Numbness and tingling in hand.   Sulfonamide Derivatives Other (See Comments)    Gi upset   Sulfa Antibiotics Nausea Only    Other reaction(s): Other (See Comments) Gi upset  Other reaction(s): Other (See Comments) Gi upset Other reaction(s): Unknown   Past Medical History:  Diagnosis Date   Abdominal pain 01/16/2013   Acid reflux    Allergy    Arthritis    ARTHRITIS IN NECK BY DR. Clovis Garza Kristy Garza   Asthma    Interstitial cystitis    Osteoporosis    PONV (postoperative nausea and vomiting)    Tinnitus    Trigeminal neuralgia    Atypical trigeminal neuralgia    Current Outpatient Medications:    acetaminophen (TYLENOL) 500 MG tablet, Take by mouth., Disp: , Rfl:    albuterol (VENTOLIN HFA) 108 (90 Base) MCG/ACT inhaler, TAKE 2 PUFFS BY MOUTH EVERY 6 HOURS AS NEEDED FOR WHEEZE OR SHORTNESS OF BREATH, Disp: 8 each, Rfl: 1  ascorbic acid (VITAMIN C) 1000 MG tablet, Take by mouth., Disp: , Rfl:    aspirin-acetaminophen-caffeine (EXCEDRIN MIGRAINE) 250-250-65 MG tablet, Take by mouth., Disp: , Rfl:    b complex vitamins capsule, Take 1 capsule by mouth daily., Disp: , Rfl:    butalbital-acetaminophen-caffeine (FIORICET) 50-325-40 MG tablet, Take 1 tablet by mouth every 6 (six) hours as needed for headache., Disp: 14 tablet, Rfl: 0   calcium citrate-vitamin D (CITRACAL+D) 315-200 MG-UNIT tablet, Take by mouth., Disp: , Rfl:    Cholecalciferol (VITAMIN D3) 125 MCG (5000 UT) CAPS, Take 5,000 Units by mouth daily., Disp: , Rfl:    famotidine (PEPCID) 20 MG  tablet, Take 20 mg by mouth 2 (two) times daily., Disp: , Rfl:    ferrous sulfate (FE TABS) 325 (65 FE) MG EC tablet, Take 1 tablet (325 mg total) by mouth daily with breakfast., Disp: 90 tablet, Rfl: 1   Fluticasone Furoate (ARNUITY ELLIPTA) 200 MCG/ACT AEPB, Inhale 1 puff into the lungs daily., Disp: 30 each, Rfl: 5   hydrOXYzine (ATARAX) 25 MG tablet, Take 25 mg by mouth at bedtime., Disp: , Rfl:    hyoscyamine (LEVSIN SL) 0.125 MG SL tablet, Place under the tongue every 4 (four) hours as needed., Disp: , Rfl:    lidocaine (XYLOCAINE) 2 % jelly, APPLY TOPICALLY TO AFFECTED AREA EVERY DAY AS NEEDED (use sparingly), Disp: 85 g, Rfl: 0   Lidocaine-Collagen-Aloe Vera (REGENECARE) 2 % GEL, Apply topically daily., Disp: 85 g, Rfl: 2   melatonin 5 MG TABS, Take 5 mg by mouth., Disp: , Rfl:    Meth-Hyo-M Bl-Na Phos-Ph Sal (URO-MP) 118 MG CAPS, Take 1 capsule by mouth 4 (four) times daily as needed., Disp: , Rfl:    mirtazapine (REMERON) 7.5 MG tablet, Take 7.5 mg by mouth at bedtime., Disp: , Rfl:    NALTREXONE HCL PO, Take by mouth., Disp: , Rfl:    olopatadine (PATANOL) 0.1 % ophthalmic solution, Place 1 drop into both eyes 2 (two) times daily as needed for allergies., Disp: 5 mL, Rfl: 5   Olopatadine-Mometasone (RYALTRIS) 665-25 MCG/ACT SUSP, Place 1-2 sprays into the nose in the morning and at bedtime., Disp: 29 g, Rfl: 5   phenazopyridine (PYRIDIUM) 200 MG tablet, Take 1 tablet (200 mg total) by mouth 4 (four) times daily as needed for pain (urinary frequency &/or pain)., Disp: 12 tablet, Rfl: 0   Polyethylene Glycol 3350 (MIRALAX PO), Take by mouth as needed., Disp: , Rfl:    praziquantel (BILTRICIDE) 600 MG tablet, Take 600 mg by mouth 3 (three) times daily., Disp: , Rfl:    Probiotic Product (PROBIOTIC PO), Take by mouth daily., Disp: , Rfl:    sennosides-docusate sodium (SENOKOT-S) 8.6-50 MG tablet, Take 1 tablet by mouth daily., Disp: 30 tablet, Rfl: 0   Simethicone (GAS-X PO), Take by mouth.,  Disp: , Rfl:    tizanidine (ZANAFLEX) 2 MG capsule, Take 1-2 capsules (2-4 mg total) by mouth 3 (three) times daily as needed for muscle spasms., Disp: 60 capsule, Rfl: 2   traMADol (ULTRAM) 50 MG tablet, Take 1 tablet (50 mg total) by mouth every 12 (twelve) hours as needed., Disp: 30 tablet, Rfl: 1   UNABLE TO FIND, Take 1 capsule by mouth daily. Dv3- vitamin D 3 plus immune support, Disp: , Rfl:   Current Facility-Administered Medications:    zoledronic acid (RECLAST) injection 5 mg, 5 mg, Intravenous, Once, Kristy Ip, DO Social History   Socioeconomic History   Marital status: Married  Spouse name: Lyda Jester   Number of children: 3   Years of education: 12   Highest education level: Some college, no degree  Occupational History   Occupation: CNA    Comment: Retired  Tobacco Use   Smoking status: Never   Smokeless tobacco: Never  Vaping Use   Vaping status: Never Used  Substance and Sexual Activity   Alcohol use: No    Alcohol/week: 0.0 standard drinks of alcohol    Comment: 01-19-2016 per pt no   Drug use: No    Comment: 01-19-2016 per pt no    Sexual activity: Not Currently    Birth control/protection: Surgical  Other Topics Concern   Not on file  Social History Narrative   Lives with husband.    Social Determinants of Health   Financial Resource Strain: Low Risk  (07/14/2022)   Overall Financial Resource Strain (CARDIA)    Difficulty of Paying Living Expenses: Not hard at all  Food Insecurity: No Food Insecurity (07/14/2022)   Hunger Vital Sign    Worried About Running Out of Food in the Last Year: Never true    Ran Out of Food in the Last Year: Never true  Transportation Needs: No Transportation Needs (07/14/2022)   PRAPARE - Administrator, Civil Service (Medical): No    Lack of Transportation (Non-Medical): No  Physical Activity: Insufficiently Active (07/14/2022)   Exercise Vital Sign    Days of Exercise per Week: 3 days    Minutes of  Exercise per Session: 30 min  Stress: No Stress Concern Present (07/14/2022)   [employer] of Occupational Health - Occupational Stress Questionnaire    Feeling of Stress : Not at all  Social Connections: Moderately Integrated (07/14/2022)   Social Connection and Isolation Panel [NHANES]    Frequency of Communication with Friends and Family: More than three times a week    Frequency of Social Gatherings with Friends and Family: More than three times a week    Attends Religious Services: 1 to 4 times per year    Active Member of [employer] or Organizations: No    Attends Banker Meetings: Never    Marital Status: Married  Catering manager Violence: Not At Risk (07/14/2022)   Humiliation, Afraid, Rape, and Kick questionnaire    Fear of Current or Ex-Partner: No    Emotionally Abused: No    Physically Abused: No    Sexually Abused: No   Family History  Problem Relation Age of Onset   Hyperlipidemia Mother    Arthritis Mother    Cancer Mother    Lymphoma Mother    Cancer Father    Allergies Father    Allergies Sister    Allergies Sister    Allergic rhinitis Neg Hx    Angioedema Neg Hx    Asthma Neg Hx    Eczema Neg Hx    Immunodeficiency Neg Hx    Urticaria Neg Hx     Objective: Office vital signs reviewed. BP 109/65   Pulse 80   Temp 98.9 F (37.2 C)   Ht 5\' 8"  (1.727 m)   Wt 114 lb 3.2 oz (51.8 kg)   SpO2 96%   BMI 17.36 kg/m   Physical Examination:  General: Awake, alert, thin female, underweight, No acute distress HEENT: sclera white, MMM.  No conjunctival pallor.  No exophthalmos or goiter.  She has a prominent fat pad along the upper trapezius.  This is nontender Cardio: regular rate and rhythm, S1S2  heard, no murmurs appreciated Pulm: clear to auscultation bilaterally, no wheezes, rhonchi or rales; normal work of breathing on room air GI: flat nondistended MSK: Ambulating independently.  Gait appears normal.    Assessment/ Plan: 72 y.o. female    Chronic fatigue - Plan: CMP14+EGFR, TSH, T4, Free, CBC, Iron, TIBC and Ferritin Panel, Vitamin B12, Magnesium  Underweight (BMI < 18.5) - Plan: CMP14+EGFR, TSH, T4, Free, CBC, Iron, TIBC and Ferritin Panel, Vitamin B12, Magnesium  Low back pain radiating to left lower extremity  Sleep difficulties  Irritable bowel syndrome with constipation  Hypertrophy of fat pad  Suspect that this is due to her restrictive eating in the setting of multiple recommendations by her integrative specialist, allergist etc.  She is underweight.  I will check vitamin levels, CBC, thyroid levels etc.  I encouraged her to eat a balanced diet.  I agree that I am not quite sure how much the integrative specialist are helping at this point as her overall health seems to have declined over the last couple of years.  I agree with her gastroenterologist that it may be in her best interest to discontinue seeing them.  I am glad to place referral to physical therapy should she desire for low back pain.  For now she is planning on seeing integrative again for possible injection as this did seem to be helpful in the past  Sleep difficulties are relieved by use of melatonin.  Okay to continue this regimen.  I will discontinue mirtazapine as she has not been utilizing this  We discussed ways to manage her chronic constipation without overstimulating her bowel and resulting in fecal incontinence.  Perhaps she can utilize things like prunes maybe every other day.  Would certainly stay away from things like laxative for daily regulation of BM  I reviewed her ultrasound results and the area of concern may be either inflammatory or prominent fat pad.  I lean towards the latter.  Could consider CT if she finds this to be enlarging or demonstrating any other concerning features.  CBC as above   Kristy Ip, DO [facility] [phone]

## 2022-12-28 DIAGNOSIS — Z23 Encounter for immunization: Secondary | ICD-10-CM | POA: Diagnosis not present

## 2022-12-28 LAB — CMP14+EGFR
ALT: 12 [IU]/L (ref 0–32)
AST: 20 [IU]/L (ref 0–40)
Albumin: 4.5 g/dL (ref 3.8–4.8)
Alkaline Phosphatase: 55 [IU]/L (ref 44–121)
BUN/Creatinine Ratio: 20 (ref 12–28)
BUN: 17 mg/dL (ref 8–27)
Bilirubin Total: 0.2 mg/dL (ref 0.0–1.2)
CO2: 24 mmol/L (ref 20–29)
Calcium: 9.7 mg/dL (ref 8.7–10.3)
Chloride: 99 mmol/L (ref 96–106)
Creatinine, Ser: 0.87 mg/dL (ref 0.57–1.00)
Globulin, Total: 2.3 g/dL (ref 1.5–4.5)
Glucose: 78 mg/dL (ref 70–99)
Potassium: 4.3 mmol/L (ref 3.5–5.2)
Sodium: 138 mmol/L (ref 134–144)
Total Protein: 6.8 g/dL (ref 6.0–8.5)
eGFR: 71 mL/min/{1.73_m2} (ref >59)

## 2022-12-28 LAB — CBC
Hematocrit: 32.2 % — ABNORMAL LOW (ref 34.0–46.6)
Hemoglobin: 10.6 g/dL — ABNORMAL LOW (ref 11.1–15.9)
MCH: 31.4 pg (ref 26.6–33.0)
MCHC: 32.9 g/dL (ref 31.5–35.7)
MCV: 95 fL (ref 79–97)
Platelets: 221 10*3/uL (ref 150–450)
RBC: 3.38 x10E6/uL — ABNORMAL LOW (ref 3.77–5.28)
RDW: 11.2 % — ABNORMAL LOW (ref 11.7–15.4)
WBC: 5.7 10*3/uL (ref 3.4–10.8)

## 2022-12-28 LAB — VITAMIN B12: Vitamin B-12: 1147 pg/mL (ref 232–1245)

## 2022-12-28 LAB — IRON,TIBC AND FERRITIN PANEL
Ferritin: 79 ng/mL (ref 15–150)
Iron Saturation: 15 % (ref 15–55)
Iron: 41 ug/dL (ref 27–139)
Total Iron Binding Capacity: 275 ug/dL (ref 250–450)
UIBC: 234 ug/dL (ref 118–369)

## 2022-12-28 LAB — T4, FREE: Free T4: 1.15 ng/dL (ref 0.82–1.77)

## 2022-12-28 LAB — MAGNESIUM: Magnesium: 2.2 mg/dL (ref 1.6–2.3)

## 2022-12-28 LAB — TSH: TSH: 0.464 u[IU]/mL (ref 0.450–4.500)

## 2022-12-29 DIAGNOSIS — M810 Age-related osteoporosis without current pathological fracture: Secondary | ICD-10-CM | POA: Diagnosis not present

## 2022-12-29 DIAGNOSIS — Z780 Asymptomatic menopausal state: Secondary | ICD-10-CM | POA: Diagnosis not present

## 2023-01-05 DIAGNOSIS — M9902 Segmental and somatic dysfunction of thoracic region: Secondary | ICD-10-CM | POA: Diagnosis not present

## 2023-01-05 DIAGNOSIS — M62830 Muscle spasm of back: Secondary | ICD-10-CM | POA: Diagnosis not present

## 2023-01-05 DIAGNOSIS — M9901 Segmental and somatic dysfunction of cervical region: Secondary | ICD-10-CM | POA: Diagnosis not present

## 2023-01-05 DIAGNOSIS — M9903 Segmental and somatic dysfunction of lumbar region: Secondary | ICD-10-CM | POA: Diagnosis not present

## 2023-01-25 DIAGNOSIS — M62830 Muscle spasm of back: Secondary | ICD-10-CM | POA: Diagnosis not present

## 2023-01-25 DIAGNOSIS — M9902 Segmental and somatic dysfunction of thoracic region: Secondary | ICD-10-CM | POA: Diagnosis not present

## 2023-01-25 DIAGNOSIS — M9901 Segmental and somatic dysfunction of cervical region: Secondary | ICD-10-CM | POA: Diagnosis not present

## 2023-01-25 DIAGNOSIS — M9903 Segmental and somatic dysfunction of lumbar region: Secondary | ICD-10-CM | POA: Diagnosis not present

## 2023-02-08 DIAGNOSIS — M9902 Segmental and somatic dysfunction of thoracic region: Secondary | ICD-10-CM | POA: Diagnosis not present

## 2023-02-08 DIAGNOSIS — M62830 Muscle spasm of back: Secondary | ICD-10-CM | POA: Diagnosis not present

## 2023-02-08 DIAGNOSIS — M9903 Segmental and somatic dysfunction of lumbar region: Secondary | ICD-10-CM | POA: Diagnosis not present

## 2023-02-08 DIAGNOSIS — M9901 Segmental and somatic dysfunction of cervical region: Secondary | ICD-10-CM | POA: Diagnosis not present

## 2023-02-22 DIAGNOSIS — M9901 Segmental and somatic dysfunction of cervical region: Secondary | ICD-10-CM | POA: Diagnosis not present

## 2023-02-22 DIAGNOSIS — M62830 Muscle spasm of back: Secondary | ICD-10-CM | POA: Diagnosis not present

## 2023-02-22 DIAGNOSIS — M9902 Segmental and somatic dysfunction of thoracic region: Secondary | ICD-10-CM | POA: Diagnosis not present

## 2023-02-22 DIAGNOSIS — M9903 Segmental and somatic dysfunction of lumbar region: Secondary | ICD-10-CM | POA: Diagnosis not present

## 2023-03-08 DIAGNOSIS — M62830 Muscle spasm of back: Secondary | ICD-10-CM | POA: Diagnosis not present

## 2023-03-08 DIAGNOSIS — M9901 Segmental and somatic dysfunction of cervical region: Secondary | ICD-10-CM | POA: Diagnosis not present

## 2023-03-08 DIAGNOSIS — M9903 Segmental and somatic dysfunction of lumbar region: Secondary | ICD-10-CM | POA: Diagnosis not present

## 2023-03-08 DIAGNOSIS — M9902 Segmental and somatic dysfunction of thoracic region: Secondary | ICD-10-CM | POA: Diagnosis not present

## 2023-03-15 ENCOUNTER — Other Ambulatory Visit: Payer: Self-pay | Admitting: Family Medicine

## 2023-03-15 DIAGNOSIS — M810 Age-related osteoporosis without current pathological fracture: Secondary | ICD-10-CM

## 2023-03-15 NOTE — Progress Notes (Signed)
Spoke with pt about making lab appt, while on phone pt said she fell about a week ago and has been having shoulder pain since. Advised pt would be best to be seen in person. Appt made for tomorrow with DOD. Pt verbalized understanding

## 2023-03-15 NOTE — Progress Notes (Signed)
Please call pt to set up lab appt ASAP so she can get her Reclast for her osteoporosis.  Orders Placed This Encounter  Procedures   CMP14+EGFR    Standing Status:   Future    Expiration Date:   03/14/2024   VITAMIN D 25 Hydroxy (Vit-D Deficiency, Fractures)    Standing Status:   Future    Expiration Date:   03/14/2024

## 2023-03-16 ENCOUNTER — Ambulatory Visit (INDEPENDENT_AMBULATORY_CARE_PROVIDER_SITE_OTHER): Payer: Medicare HMO | Admitting: Family Medicine

## 2023-03-16 ENCOUNTER — Ambulatory Visit: Payer: Medicare HMO

## 2023-03-16 ENCOUNTER — Other Ambulatory Visit: Payer: Self-pay

## 2023-03-16 ENCOUNTER — Telehealth: Payer: Self-pay

## 2023-03-16 ENCOUNTER — Encounter: Payer: Self-pay | Admitting: Family Medicine

## 2023-03-16 VITALS — BP 111/74 | HR 75 | Temp 98.7°F | Ht 68.0 in | Wt 120.0 lb

## 2023-03-16 DIAGNOSIS — W19XXXA Unspecified fall, initial encounter: Secondary | ICD-10-CM

## 2023-03-16 DIAGNOSIS — M810 Age-related osteoporosis without current pathological fracture: Secondary | ICD-10-CM | POA: Diagnosis not present

## 2023-03-16 NOTE — Telephone Encounter (Signed)
Auth Submission: NO AUTH NEEDED Site of care: Site of care: [facility] Payer: [employer] medicare Medication & CPT/J Code(s) submitted: Reclast (Zolendronic acid) [id number] Route of submission (phone, fax, portal): portal Phone # Fax # Auth type: Buy/Bill PB Units/visits requested: 1 visit Reference number:  Approval from: 03/16/23 to 03/28/23

## 2023-03-16 NOTE — Progress Notes (Signed)
Subjective:  Patient ID: Kristy Garza, female    DOB: 1951/01/02, 72 y.o.   MRN: [id number]  Patient Care Team: Raliegh Ip, DO as PCP - General (Family Medicine) Waymon Budge, MD as Consulting Physician (Pulmonary Disease) Rollene Rotunda, MD as Consulting Physician (Cardiology) Randa Spike Kelton Pillar, LCSW as Social Worker (Licensed Clinical Social Worker) Marney Setting, MD as Referring Physician (Gastroenterology)   Chief Complaint:  Shoulder Injury   HPI: Kristy Garza is a 72 y.o. female presenting on 03/16/2023 for Shoulder Injury   Shoulder Injury    1. Fall, initial encounter Event occurred on 03/04/23. States that she was visiting daughter. Walking, looking at Christmas lights, fell and landed on her left shoulder. States that she also hit her left knee and face. She has been using biofreeze, salonpas, heat, ice. States that today is the best day that she has had. Still has minimal pain.   Relevant past medical, surgical, family, and social history reviewed and updated as indicated.  Allergies and medications reviewed and updated. Data reviewed: Chart in Epic.   Past Medical History:  Diagnosis Date   Abdominal pain 01/16/2013   Acid reflux    Allergy    Arthritis    ARTHRITIS IN NECK BY DR. Clovis Riley [facility]   Asthma    Interstitial cystitis    Osteoporosis    PONV (postoperative nausea and vomiting)    Tinnitus    Trigeminal neuralgia    Atypical trigeminal neuralgia    Past Surgical History:  Procedure Laterality Date   ABDOMINAL HYSTERECTOMY  2000   CYSTOSCOPY WITH HYDRODISTENSION AND BIOPSY N/A 06/11/2012   Procedure: CYSTOSCOPY/BIOPSY/HYDRODISTENSION with Instillation of Pyridium and Marcaine and Kenalog;  Surgeon: Kathi Ludwig, MD;  Location: [facility];  Service: Urology;  Laterality: N/A;  1 hour requested for this case  BLADDER BIOPSY   ETHMOIDECTOMY  2012   SEPTOPLASTY  1980's   vocal cord surgery   1990's    polyp removal    Social History   Socioeconomic History   Marital status: Married    Spouse name: Lyda Jester   Number of children: 3   Years of education: 12   Highest education level: Some college, no degree  Occupational History   Occupation: CNA    Comment: Retired  Tobacco Use   Smoking status: Never   Smokeless tobacco: Never  Vaping Use   Vaping status: Never Used  Substance and Sexual Activity   Alcohol use: No    Alcohol/week: 0.0 standard drinks of alcohol    Comment: 01-19-2016 per pt no   Drug use: No    Comment: 01-19-2016 per pt no    Sexual activity: Not Currently    Birth control/protection: Surgical  Other Topics Concern   Not on file  Social History Narrative   Lives with husband.    Social Drivers of Corporate investment banker Strain: Low Risk  (07/14/2022)   Overall Financial Resource Strain (CARDIA)    Difficulty of Paying Living Expenses: Not hard at all  Food Insecurity: No Food Insecurity (07/14/2022)   Hunger Vital Sign    Worried About Running Out of Food in the Last Year: Never true    Ran Out of Food in the Last Year: Never true  Transportation Needs: No Transportation Needs (07/14/2022)   PRAPARE - Administrator, Civil Service (Medical): No    Lack of Transportation (Non-Medical): No  Physical Activity: Insufficiently Active (07/14/2022)  Exercise Vital Sign    Days of Exercise per Week: 3 days    Minutes of Exercise per Session: 30 min  Stress: No Stress Concern Present (07/14/2022)   [employer] of Occupational Health - Occupational Stress Questionnaire    Feeling of Stress : Not at all  Social Connections: Moderately Integrated (07/14/2022)   Social Connection and Isolation Panel [NHANES]    Frequency of Communication with Friends and Family: More than three times a week    Frequency of Social Gatherings with Friends and Family: More than three times a week    Attends Religious Services: 1 to 4 times per year    Active  Member of [employer] or Organizations: No    Attends Banker Meetings: Never    Marital Status: Married  Catering manager Violence: Not At Risk (07/14/2022)   Humiliation, Afraid, Rape, and Kick questionnaire    Fear of Current or Ex-Partner: No    Emotionally Abused: No    Physically Abused: No    Sexually Abused: No    Outpatient Encounter Medications as of 03/16/2023  Medication Sig   acetaminophen (TYLENOL) 500 MG tablet Take by mouth.   albuterol (VENTOLIN HFA) 108 (90 Base) MCG/ACT inhaler TAKE 2 PUFFS BY MOUTH EVERY 6 HOURS AS NEEDED FOR WHEEZE OR SHORTNESS OF BREATH   ascorbic acid (VITAMIN C) 1000 MG tablet Take by mouth.   aspirin-acetaminophen-caffeine (EXCEDRIN MIGRAINE) 250-250-65 MG tablet Take by mouth.   b complex vitamins capsule Take 1 capsule by mouth daily.   butalbital-acetaminophen-caffeine (FIORICET) 50-325-40 MG tablet Take 1 tablet by mouth every 6 (six) hours as needed for headache.   calcium citrate-vitamin D (CITRACAL+D) 315-200 MG-UNIT tablet Take by mouth.   Cholecalciferol (VITAMIN D3) 125 MCG (5000 UT) CAPS Take 5,000 Units by mouth daily.   famotidine (PEPCID) 20 MG tablet Take 20 mg by mouth 2 (two) times daily.   ferrous sulfate (FE TABS) 325 (65 FE) MG EC tablet Take 1 tablet (325 mg total) by mouth daily with breakfast.   Fluticasone Furoate (ARNUITY ELLIPTA) 200 MCG/ACT AEPB Inhale 1 puff into the lungs daily.   hydrOXYzine (ATARAX) 25 MG tablet Take 25 mg by mouth at bedtime.   hyoscyamine (LEVSIN SL) 0.125 MG SL tablet Place under the tongue every 4 (four) hours as needed.   lidocaine (XYLOCAINE) 2 % jelly APPLY TOPICALLY TO AFFECTED AREA EVERY DAY AS NEEDED (use sparingly)   Lidocaine-Collagen-Aloe Vera (REGENECARE) 2 % GEL Apply topically daily.   melatonin 5 MG TABS Take 5 mg by mouth.   Meth-Hyo-M Bl-Na Phos-Ph Sal (URO-MP) 118 MG CAPS Take 1 capsule by mouth 4 (four) times daily as needed.   NALTREXONE HCL PO Take by mouth.    olopatadine (PATANOL) 0.1 % ophthalmic solution Place 1 drop into both eyes 2 (two) times daily as needed for allergies.   Olopatadine-Mometasone (RYALTRIS) [id number] MCG/ACT SUSP Place 1-2 sprays into the nose in the morning and at bedtime.   phenazopyridine (PYRIDIUM) 200 MG tablet Take 1 tablet (200 mg total) by mouth 4 (four) times daily as needed for pain (urinary frequency &/or pain).   Polyethylene Glycol 3350 (MIRALAX PO) Take by mouth as needed.   praziquantel (BILTRICIDE) 600 MG tablet Take 600 mg by mouth 3 (three) times daily.   Probiotic Product (PROBIOTIC PO) Take by mouth daily.   sennosides-docusate sodium (SENOKOT-S) 8.6-50 MG tablet Take 1 tablet by mouth daily.   Simethicone (GAS-X PO) Take by mouth.   tizanidine (ZANAFLEX)  2 MG capsule Take 1-2 capsules (2-4 mg total) by mouth 3 (three) times daily as needed for muscle spasms.   traMADol (ULTRAM) 50 MG tablet Take 1 tablet (50 mg total) by mouth every 12 (twelve) hours as needed.   UNABLE TO FIND Take 1 capsule by mouth daily. Dv3- vitamin D 3 plus immune support   Facility-Administered Encounter Medications as of 03/16/2023  Medication   zoledronic acid (RECLAST) injection 5 mg    Allergies  Allergen Reactions   Cefuroxime Axetil Other (See Comments)    Extreme gas   Gabapentin Other (See Comments)    Constipation Constipation   Montelukast Other (See Comments)    Numbness and tingling in hand. Numbness and tingling in hand. Numbness and tingling in hand. NUMBNESS OF HANDS AND FEET. Numbness in hands and feet NUMBNESS OF HANDS AND FEET.   Pregabalin Other (See Comments)    Drowsiness, dry mouth Drowsiness, dry mouth Makes her tired and thirsty   Augmentin [Amoxicillin-Pot Clavulanate] Diarrhea   Avelox [Moxifloxacin Hcl In Nacl] Other (See Comments)    Tremors    Biaxin [Clarithromycin]     Unsure of reaction   Cedax [Ceftibuten] Other (See Comments)    tremors   Ciprofloxacin Other (See Comments) and  Nausea And Vomiting    Blurred vision Blurred vision Pt can't remember reaction   Clindamycin/Lincomycin     tachycardia   Doxycycline Diarrhea    Nausea , stomach upset   Levofloxacin Other (See Comments)    Feels dehydrated   Lincomycin Other (See Comments)    tachycardia   Montelukast Sodium Other (See Comments)    Numbness and tingling in hand. Numbness and tingling in hand.   Sulfonamide Derivatives Other (See Comments)    Gi upset   Sulfa Antibiotics Nausea Only    Other reaction(s): Other (See Comments) Gi upset  Other reaction(s): Other (See Comments) Gi upset Other reaction(s): Unknown    Review of Systems As per HPI  Objective:  BP 111/74   Pulse 75   Temp 98.7 F (37.1 C)   Ht 5\' 8"  (1.727 m)   Wt 120 lb (54.4 kg)   SpO2 95%   BMI 18.25 kg/m    Wt Readings from Last 3 Encounters:  03/16/23 120 lb (54.4 kg)  12/27/22 114 lb 3.2 oz (51.8 kg)  10/19/22 118 lb (53.5 kg)   Physical Exam Constitutional:      General: She is awake. She is not in acute distress.    Appearance: Normal appearance. She is well-developed and well-groomed. She is not ill-appearing, toxic-appearing or diaphoretic.  Cardiovascular:     Rate and Rhythm: Normal rate and regular rhythm.     Pulses: Normal pulses.          Radial pulses are 2+ on the right side and 2+ on the left side.       Posterior tibial pulses are 2+ on the right side and 2+ on the left side.     Heart sounds: Normal heart sounds. No murmur heard.    No gallop.  Pulmonary:     Effort: Pulmonary effort is normal. No respiratory distress.     Breath sounds: Normal breath sounds. No stridor. No wheezing, rhonchi or rales.  Musculoskeletal:     Right shoulder: Normal.     Left shoulder: Tenderness and crepitus present. No swelling, deformity, effusion, laceration or bony tenderness. Normal range of motion. Normal strength. Normal pulse.     Cervical back: Full passive  range of motion without pain and neck supple.      Right lower leg: No edema.     Left lower leg: No edema.     Comments: Mild creptius and tenderness   Skin:    General: Skin is warm.     Capillary Refill: Capillary refill takes less than 2 seconds.  Neurological:     General: No focal deficit present.     Mental Status: She is alert, oriented to person, place, and time and easily aroused. Mental status is at baseline.     GCS: GCS eye subscore is 4. GCS verbal subscore is 5. GCS motor subscore is 6.     Motor: No weakness.  Psychiatric:        Attention and Perception: Attention and perception normal.        Mood and Affect: Mood and affect normal.        Speech: Speech normal.        Behavior: Behavior normal. Behavior is cooperative.        Thought Content: Thought content normal. Thought content does not include homicidal or suicidal ideation. Thought content does not include homicidal or suicidal plan.        Cognition and Memory: Cognition and memory normal.        Judgment: Judgment normal.     Results for orders placed or performed in visit on 12/27/22  CMP14+EGFR   Collection Time: 12/27/22  2:52 PM  Result Value Ref Range   Glucose 78 70 - 99 mg/dL   BUN 17 8 - 27 mg/dL   Creatinine, Ser 4.09 0.57 - 1.00 mg/dL   eGFR 71 >81 XB/JYN/8.29   BUN/Creatinine Ratio 20 12 - 28   Sodium 138 134 - 144 mmol/L   Potassium 4.3 3.5 - 5.2 mmol/L   Chloride 99 96 - 106 mmol/L   CO2 24 20 - 29 mmol/L   Calcium 9.7 8.7 - 10.3 mg/dL   Total Protein 6.8 6.0 - 8.5 g/dL   Albumin 4.5 3.8 - 4.8 g/dL   Globulin, Total 2.3 1.5 - 4.5 g/dL   Bilirubin Total 0.2 0.0 - 1.2 mg/dL   Alkaline Phosphatase 55 44 - 121 IU/L   AST 20 0 - 40 IU/L   ALT 12 0 - 32 IU/L  TSH   Collection Time: 12/27/22  2:52 PM  Result Value Ref Range   TSH 0.464 0.450 - 4.500 uIU/mL  T4, Free   Collection Time: 12/27/22  2:52 PM  Result Value Ref Range   Free T4 1.15 0.82 - 1.77 ng/dL  CBC   Collection Time: 12/27/22  2:52 PM  Result Value Ref Range    WBC 5.7 3.4 - 10.8 x10E3/uL   RBC 3.38 (L) 3.77 - 5.28 x10E6/uL   Hemoglobin 10.6 (L) 11.1 - 15.9 g/dL   Hematocrit 56.2 (L) 13.0 - 46.6 %   MCV 95 79 - 97 fL   MCH 31.4 26.6 - 33.0 pg   MCHC 32.9 31.5 - 35.7 g/dL   RDW 86.5 (L) 78.4 - 69.6 %   Platelets 221 150 - 450 x10E3/uL  Iron, TIBC and Ferritin Panel   Collection Time: 12/27/22  2:52 PM  Result Value Ref Range   Total Iron Binding Capacity 275 250 - 450 ug/dL   UIBC 295 284 - 132 ug/dL   Iron 41 27 - 440 ug/dL   Iron Saturation 15 15 - 55 %   Ferritin 79 15 - 150 ng/mL  Vitamin  B12   Collection Time: 12/27/22  2:52 PM  Result Value Ref Range   Vitamin B-12 1,147 232 - 1,245 pg/mL  Magnesium   Collection Time: 12/27/22  2:52 PM  Result Value Ref Range   Magnesium 2.2 1.6 - 2.3 mg/dL   *Note: Due to a large number of results and/or encounters for the requested time period, some results have not been displayed. A complete set of results can be found in Results Review.       03/16/2023    2:59 PM 12/27/2022    2:18 PM 10/19/2022    3:59 PM 10/12/2022   11:38 AM 08/25/2022    8:17 AM  Depression screen PHQ 2/9  Decreased Interest 0 0 0 0 0  Down, Depressed, Hopeless 0 0 0 0 0  PHQ - 2 Score 0 0 0 0 0  Altered sleeping 0 0 0    Tired, decreased energy 0 0 0    Change in appetite 0 0 0    Feeling bad or failure about yourself  0 0 0    Trouble concentrating 0 0 0    Moving slowly or fidgety/restless 0 0 0    Suicidal thoughts 0 0 0    PHQ-9 Score 0 0 0    Difficult doing work/chores  Not difficult at all Not difficult at all         03/16/2023    2:59 PM 12/27/2022    2:19 PM 10/19/2022    4:00 PM 08/19/2022    7:58 AM  GAD 7 : Generalized Anxiety Score  Nervous, Anxious, on Edge 0 0 0 0  Control/stop worrying 0 0 0 0  Worry too much - different things 0 0 0 0  Trouble relaxing 0 0 0 0  Restless 0 0 0 0  Easily annoyed or irritable 0 0 0 0  Afraid - awful might happen 0 0 0 0  Total GAD 7 Score 0 0 0 0   Anxiety Difficulty Not difficult at all Not difficult at all Not difficult at all Not difficult at all   Pertinent labs & imaging results that were available during my care of the patient were reviewed by me and considered in my medical decision making.  Assessment & Plan:  Catara was seen today for shoulder injury.  Diagnoses and all orders for this visit:  Fall, initial encounter Imaging as below. Will communicate results to patient once available. Will await results to determine next steps.  Encouraged patient to continue OTC pain management. Continue to exercise and stretch arm. Declined naproxen at this time.  -     DG Shoulder Left  Osteoporosis of multiple sites Previously ordered. Reviewed notes from [place], DO. 03/15/23 -     VITAMIN D 25 Hydroxy (Vit-D Deficiency, Fractures) -     CMP14+EGFR  Continue all other maintenance medications.  Follow up plan: Return if symptoms worsen or fail to improve.   Continue healthy lifestyle choices, including diet (rich in fruits, vegetables, and lean proteins, and low in salt and simple carbohydrates) and exercise (at least 30 minutes of moderate physical activity daily).  Written and verbal instructions provided   The above assessment and management plan was discussed with the patient. The patient verbalized understanding of and has agreed to the management plan. Patient is aware to call the clinic if they develop any new symptoms or if symptoms persist or worsen. Patient is aware when to return to the clinic for a follow-up  visit. Patient educated on when it is appropriate to go to the emergency department.   Neale Burly, DNP-FNP [facility] [address] [place], [place] [address] [phone]

## 2023-03-16 NOTE — Patient Instructions (Signed)
Voltaren gel 

## 2023-03-17 ENCOUNTER — Telehealth: Payer: Self-pay | Admitting: Family Medicine

## 2023-03-17 LAB — CMP14+EGFR
ALT: 14 [IU]/L (ref 0–32)
AST: 23 [IU]/L (ref 0–40)
Albumin: 4.5 g/dL (ref 3.8–4.8)
Alkaline Phosphatase: 63 [IU]/L (ref 44–121)
BUN/Creatinine Ratio: 17 (ref 12–28)
BUN: 15 mg/dL (ref 8–27)
Bilirubin Total: 0.2 mg/dL (ref 0.0–1.2)
CO2: 25 mmol/L (ref 20–29)
Calcium: 10 mg/dL (ref 8.7–10.3)
Chloride: 97 mmol/L (ref 96–106)
Creatinine, Ser: 0.89 mg/dL (ref 0.57–1.00)
Globulin, Total: 2.3 g/dL (ref 1.5–4.5)
Glucose: 88 mg/dL (ref 70–99)
Potassium: 4.6 mmol/L (ref 3.5–5.2)
Sodium: 137 mmol/L (ref 134–144)
Total Protein: 6.8 g/dL (ref 6.0–8.5)
eGFR: 69 mL/min/{1.73_m2} (ref >59)

## 2023-03-17 LAB — VITAMIN D 25 HYDROXY (VIT D DEFICIENCY, FRACTURES): Vit D, 25-Hydroxy: 71 ng/mL (ref 30.0–100.0)

## 2023-03-17 NOTE — Telephone Encounter (Unsigned)
Copied from CRM [id number]. Topic: General - Call Back - No Documentation >> Mar 17, 2023  1:52 PM Gildardo Pounds wrote: Reason for CRM: Patient returning a call from the nurse. Called [facility] but nurse was in a room with a patient. [facility] said they will have the nurse call the patient again. Patient callback number is [phone].

## 2023-03-24 ENCOUNTER — Inpatient Hospital Stay (HOSPITAL_COMMUNITY): Admission: RE | Admit: 2023-03-24 | Payer: Medicare HMO | Source: Ambulatory Visit

## 2023-03-24 ENCOUNTER — Encounter: Payer: Medicare HMO | Attending: Family Medicine | Admitting: Internal Medicine

## 2023-03-24 VITALS — BP 114/71 | HR 65 | Temp 98.0°F | Resp 16

## 2023-03-24 DIAGNOSIS — M810 Age-related osteoporosis without current pathological fracture: Secondary | ICD-10-CM

## 2023-03-24 MED ORDER — ZOLEDRONIC ACID 5 MG/100ML IV SOLN
5.0000 mg | Freq: Once | INTRAVENOUS | Status: DC
Start: 2023-03-24 — End: 2023-03-24

## 2023-03-24 MED ORDER — ACETAMINOPHEN 325 MG PO TABS
650.0000 mg | ORAL_TABLET | Freq: Once | ORAL | Status: AC
  Administered 2023-03-24: 650 mg via ORAL
  Filled 2023-03-24: qty 2

## 2023-03-24 MED ORDER — DIPHENHYDRAMINE HCL 25 MG PO CAPS
25.0000 mg | ORAL_CAPSULE | Freq: Once | ORAL | Status: AC
  Administered 2023-03-24: 25 mg via ORAL
  Filled 2023-03-24: qty 1

## 2023-03-24 NOTE — Progress Notes (Signed)
Diagnosis: Osteoporosis  Provider:  Delynn Flavin DO  Procedure: IV Infusion  IV Type: Peripheral, IV Location: R Antecubital  Reclast (Zolendronic Acid), Dose: 5 mg  Infusion Start Time: 1144  Infusion Stop Time: 1243  Post Infusion IV Care: Observation period completed  Discharge: Condition: Good, Destination: Home . AVS Provided  Performed by:  Cleotilde Neer, LPN

## 2023-04-06 ENCOUNTER — Other Ambulatory Visit: Payer: Self-pay | Admitting: Family Medicine

## 2023-04-06 ENCOUNTER — Ambulatory Visit (INDEPENDENT_AMBULATORY_CARE_PROVIDER_SITE_OTHER): Payer: Medicare HMO | Admitting: Family Medicine

## 2023-04-06 ENCOUNTER — Ambulatory Visit: Payer: Medicare HMO | Admitting: Allergy

## 2023-04-06 ENCOUNTER — Other Ambulatory Visit: Payer: Self-pay

## 2023-04-06 ENCOUNTER — Encounter: Payer: Self-pay | Admitting: Family Medicine

## 2023-04-06 VITALS — BP 102/60 | HR 81 | Temp 98.2°F | Resp 12 | Wt 118.5 lb

## 2023-04-06 DIAGNOSIS — H10403 Unspecified chronic conjunctivitis, bilateral: Secondary | ICD-10-CM | POA: Diagnosis not present

## 2023-04-06 DIAGNOSIS — J310 Chronic rhinitis: Secondary | ICD-10-CM

## 2023-04-06 DIAGNOSIS — J4540 Moderate persistent asthma, uncomplicated: Secondary | ICD-10-CM

## 2023-04-06 DIAGNOSIS — B999 Unspecified infectious disease: Secondary | ICD-10-CM

## 2023-04-06 DIAGNOSIS — K219 Gastro-esophageal reflux disease without esophagitis: Secondary | ICD-10-CM | POA: Diagnosis not present

## 2023-04-06 MED ORDER — PREDNISONE 10 MG PO TABS
10.0000 mg | ORAL_TABLET | Freq: Every day | ORAL | 0 refills | Status: DC
Start: 2023-04-06 — End: 2023-08-29

## 2023-04-06 NOTE — Progress Notes (Deleted)
 Follow Up Note  RE: Kristy Garza MRN: [id number] DOB: 09/14/1950 Date of Office Visit: 04/06/2023  Referring provider: Jolinda Norene HERO, DO Primary care provider: Jolinda Norene HERO, DO  Chief Complaint: No chief complaint on file.  History of Present Illness: I had the pleasure of seeing Kristy Garza for a follow up visit at the [facility] on 04/06/2023. She is a 73 y.o. female, who is being followed for asthma, recurrent infections, asthma, chronic rhinitis. Her previous allergy  office visit was on 08/26/2022 with Dr. Lorin. Today is a new complaint visit of not feeling well .  Discussed the use of AI scribe software for clinical note transcription with the patient, who gave verbal consent to proceed.  History of Present Illness            ***  Assessment and Plan: Kristy Garza is a 73 y.o. female with: Possible sinusitis Persistent headaches, PND, coughing, teeth pain x 1 week. No benefit with depo. History of TMJ. No recent antibiotics. Possible sinus infection. Take doxycycline  100mg  twice a day for 10 days. If you still having teeth pain recommend dental evaluation next.    Moderate persistent asthma without complication Past history - Breo caused thrush. 2022 spirometry showed: normal pattern with 7% improvement in FEV1 post bronchodilator treatment. Clinically feeling much better. Covid-19 in Nov 2022. Interim history - switched to Arnuity as Symbicort  causes some shaking. Recently using albuterol  once a day.  Daily controller medication(s): continue Arnuity 200mcg 1 puff once a day and rinse mouth after each use.  If your asthma is not doing well then let us  know.  During upper respiratory infections/asthma flares:  Start budesonide  0.5mg  nebulizer twice a day for 1-2 weeks until your breathing symptoms return to baseline.  Pretreat with albuterol  2 puffs or albuterol  nebulizer.  If you need to use your albuterol  nebulizer machine back to back  within 15-30 minutes with no relief then please go to the ER/urgent care for further evaluation.  May use albuterol  rescue inhaler 2 puffs or nebulizer every 4 to 6 hours as needed for shortness of breath, chest tightness, coughing, and wheezing. May use albuterol  rescue inhaler 2 puffs 5 to 15 minutes prior to strenuous physical activities. Monitor frequency of use.  Get spirometry at next visit.   Recurrent infections Interim history - bloodwork (Immunoglobulin levels, pneumococcal titer, tetanus/diptheria titer) normal. Keep track of infections and antibiotics use.   Nonallergic rhinitis Past history - 2022 bloodwork negative to environmental allergy  panel. ENT evaluation unremarkable (patient declined laryngoscopy). 2023 skin testing negative.  Interim history - still having sinus issues. Discussed with patient that she can have these symptoms and not have environmental allergies.  Start Ryaltris  (olopatadine  + mometasone  nasal spray combination) 1-2 sprays per nostril twice a day. Sample given. This replaces your other nasal sprays. If this works well for you, then have [facility] ship the medication to your home - prescription already sent in.  Nasal saline spray (i.e., Simply Saline) or nasal saline lavage (i.e., NeilMed) is recommended as needed and prior to medicated nasal sprays. Assessment and Plan              No follow-ups on file.  No orders of the defined types were placed in this encounter.  Lab Orders  No laboratory test(s) ordered today    Diagnostics: Spirometry:  Tracings reviewed. Her effort: {Blank single:19197::Good reproducible efforts.,It was hard to get consistent efforts and there is a question as to whether  this reflects a maximal maneuver.,Poor effort, data can not be interpreted.} FVC: ***L FEV1: ***L, ***% predicted FEV1/FVC ratio: ***% Interpretation: {Blank single:19197::Spirometry consistent with mild obstructive disease,Spirometry  consistent with moderate obstructive disease,Spirometry consistent with severe obstructive disease,Spirometry consistent with possible restrictive disease,Spirometry consistent with mixed obstructive and restrictive disease,Spirometry uninterpretable due to technique,Spirometry consistent with normal pattern,No overt abnormalities noted given today's efforts}.  Please see scanned spirometry results for details.  Skin Testing: {Blank single:19197::Select foods,Environmental allergy  panel,Environmental allergy  panel and select foods,Food allergy  panel,None,Deferred due to recent antihistamines use}. *** Results discussed with patient/family.   Medication List:  Current Outpatient Medications  Medication Sig Dispense Refill  . acetaminophen  (TYLENOL ) 500 MG tablet Take by mouth.    . albuterol  (VENTOLIN  HFA) 108 (90 Base) MCG/ACT inhaler TAKE 2 PUFFS BY MOUTH EVERY 6 HOURS AS NEEDED FOR WHEEZE OR SHORTNESS OF BREATH 8 each 1  . ascorbic acid (VITAMIN C) 1000 MG tablet Take by mouth.    SABRA aspirin-acetaminophen -caffeine  (EXCEDRIN MIGRAINE) 250-250-65 MG tablet Take by mouth.    SABRA b complex vitamins capsule Take 1 capsule by mouth daily.    . butalbital -acetaminophen -caffeine  (FIORICET) 50-325-40 MG tablet Take 1 tablet by mouth every 6 (six) hours as needed for headache. 14 tablet 0  . calcium  citrate-vitamin D  (CITRACAL+D) 315-200 MG-UNIT tablet Take by mouth.    . Cholecalciferol (VITAMIN D3) 125 MCG (5000 UT) CAPS Take 5,000 Units by mouth daily.    . famotidine  (PEPCID ) 20 MG tablet Take 20 mg by mouth 2 (two) times daily.    . ferrous sulfate  (FE TABS) 325 (65 FE) MG EC tablet Take 1 tablet (325 mg total) by mouth daily with breakfast. 90 tablet 1  . Fluticasone  Furoate (ARNUITY ELLIPTA ) 200 MCG/ACT AEPB Inhale 1 puff into the lungs daily. 30 each 5  . hydrOXYzine  (ATARAX ) 25 MG tablet Take 25 mg by mouth at bedtime.    . hyoscyamine (LEVSIN SL) 0.125 MG SL tablet  Place under the tongue every 4 (four) hours as needed.    . lidocaine  (XYLOCAINE ) 2 % jelly APPLY TOPICALLY TO AFFECTED AREA EVERY DAY AS NEEDED (use sparingly) 85 g 0  . Lidocaine -Collagen-Aloe Vera (REGENECARE) 2 % GEL Apply topically daily. 85 g 2  . melatonin 5 MG TABS Take 5 mg by mouth.    . Meth-Hyo-M Bl-Na Phos-Ph Sal (URO-MP) 118 MG CAPS Take 1 capsule by mouth 4 (four) times daily as needed.    SABRA NALTREXONE HCL PO Take by mouth.    . olopatadine  (PATANOL) 0.1 % ophthalmic solution Place 1 drop into both eyes 2 (two) times daily as needed for allergies. 5 mL 5  . Olopatadine -Mometasone  (RYALTRIS ) 665-25 MCG/ACT SUSP Place 1-2 sprays into the nose in the morning and at bedtime. 29 g 5  . phenazopyridine  (PYRIDIUM ) 200 MG tablet Take 1 tablet (200 mg total) by mouth 4 (four) times daily as needed for pain (urinary frequency &/or pain). 12 tablet 0  . Polyethylene Glycol 3350  (MIRALAX  PO) Take by mouth as needed.    . praziquantel (BILTRICIDE) 600 MG tablet Take 600 mg by mouth 3 (three) times daily.    . Probiotic Product (PROBIOTIC PO) Take by mouth daily.    . sennosides-docusate sodium  (SENOKOT-S) 8.6-50 MG tablet Take 1 tablet by mouth daily. 30 tablet 0  . Simethicone (GAS-X PO) Take by mouth.    . tizanidine  (ZANAFLEX ) 2 MG capsule Take 1-2 capsules (2-4 mg total) by mouth 3 (three) times daily as needed for  muscle spasms. 60 capsule 2  . traMADol  (ULTRAM ) 50 MG tablet Take 1 tablet (50 mg total) by mouth every 12 (twelve) hours as needed. 30 tablet 1  . UNABLE TO FIND Take 1 capsule by mouth daily. Dv3- vitamin D  3 plus immune support     No current facility-administered medications for this visit.   Allergies: Allergies  Allergen Reactions  . Cefuroxime  Axetil Other (See Comments)    Extreme gas  . Gabapentin  Other (See Comments)    Constipation Constipation  . Montelukast  Other (See Comments)    Numbness and tingling in hand. Numbness and tingling in hand. Numbness and  tingling in hand. NUMBNESS OF HANDS AND FEET. Numbness in hands and feet NUMBNESS OF HANDS AND FEET.  . Pregabalin Other (See Comments)    Drowsiness, dry mouth Drowsiness, dry mouth Makes her tired and thirsty  . Augmentin  [Amoxicillin -Pot Clavulanate] Diarrhea  . Avelox [Moxifloxacin Hcl In Nacl] Other (See Comments)    Tremors   . Biaxin  [Clarithromycin ]     Unsure of reaction  . Cedax [Ceftibuten] Other (See Comments)    tremors  . Ciprofloxacin  Other (See Comments) and Nausea And Vomiting    Blurred vision Blurred vision Pt can't remember reaction  . Clindamycin/Lincomycin     tachycardia  . Doxycycline  Diarrhea    Nausea , stomach upset  . Levofloxacin Other (See Comments)    Feels dehydrated  . Lincomycin Other (See Comments)    tachycardia  . Montelukast  Sodium Other (See Comments)    Numbness and tingling in hand. Numbness and tingling in hand.  . Sulfonamide Derivatives Other (See Comments)    Gi upset  . Sulfa Antibiotics Nausea Only    Other reaction(s): Other (See Comments) Gi upset  Other reaction(s): Other (See Comments) Gi upset Other reaction(s): Unknown   I reviewed her past medical history, social history, family history, and environmental history and no significant changes have been reported from her previous visit.  Review of Systems  Constitutional:  Negative for appetite change, chills, fever and unexpected weight change.  HENT:  Positive for postnasal drip and rhinorrhea. Negative for congestion.   Eyes:  Negative for itching.  Respiratory:  Positive for cough. Negative for chest tightness, shortness of breath and wheezing.   Gastrointestinal:  Negative for abdominal pain.  Skin:  Negative for rash.  Allergic/Immunologic: Negative for environmental allergies and food allergies.  Neurological:  Positive for headaches.   Objective: There were no vitals taken for this visit. There is no height or weight on file to calculate BMI. Physical  Exam Vitals and nursing note reviewed.  Constitutional:      Appearance: Normal appearance. She is well-developed.  HENT:     Head: Normocephalic and atraumatic.     Right Ear: Tympanic membrane and external ear normal.     Left Ear: Tympanic membrane and external ear normal.     Nose: Nose normal.     Mouth/Throat:     Mouth: Mucous membranes are moist.     Pharynx: Oropharynx is clear.  Eyes:     Conjunctiva/sclera: Conjunctivae normal.  Cardiovascular:     Rate and Rhythm: Normal rate and regular rhythm.     Heart sounds: Normal heart sounds. No murmur heard.    No friction rub. No gallop.  Pulmonary:     Effort: Pulmonary effort is normal.     Breath sounds: Normal breath sounds. No wheezing, rhonchi or rales.  Musculoskeletal:     Cervical back: Neck  supple.  Skin:    General: Skin is warm.     Findings: No rash.  Neurological:     Mental Status: She is alert and oriented to person, place, and time.  Psychiatric:        Behavior: Behavior normal.  Previous notes and tests were reviewed. The plan was reviewed with the patient/family, and all questions/concerned were addressed.  It was my pleasure to see Kristy Garza today and participate in her care. Please feel free to contact me with any questions or concerns.  Sincerely,  Orlan Cramp, DO Allergy  & Immunology  [facility]  [place] office: [phone] [facility] office: [phone]

## 2023-04-06 NOTE — Patient Instructions (Addendum)
 Asthma Continue Arnuity 200-1 puff once a day to prevent cough or wheeze.  Pretreat with albuterol  for the next few days, then use albuterol  as needed Continue albuterol  2 puffs once every 4 hours if needed for cough or wheeze  Chronic rhinitis Begin Flonase  2 sprays in each nostril once a day for nasal congestion Continue azelastine  2 sprays in each nostril twice a day if needed for runny nose Begin saline nasal rinses as needed for nasal symptoms. Use this before any medicated nasal sprays for best result For thick postnasal drainage, begin Mucinex  600 to 1200 mg twice a day Hold your antihistamine for the next week and then begin Claritin  only if needed for runny nose or itch.  Claritin  may cause drowsiness If the treatment plan as listed above is not effective, begin prednisone  taper. Prednisone  10 mg tablets. Take 2 tablets once a day for 4 days, then take 1 tablet on the 5th day, then stop.    Allergic conjunctivitis Continue olopatadine  1 drop in each eye once a day as needed for red or itchy eyes  Reflux Continue dietary and lifestyle modifications as listed below  Recurrent infection Keep track of infections, antibiotic use, and steroid use  Call the clinic if this treatment plan is not working well for you.  Follow up in 2 months or sooner if needed.

## 2023-04-06 NOTE — Progress Notes (Signed)
 [address] [place] [address] Dept: [phone]  FOLLOW UP NOTE  Patient ID: Kristy Garza, female    DOB: August 28, 1950  Age: 73 y.o. MRN: [id number] Date of Office Visit: 04/06/2023  Assessment  Chief Complaint: Nasal Congestion, Cough, and Other (All symptoms started a week ago. Throat is scratchy and sore, congestion in head and chest, feels stopped up.)  HPI Kristy Garza is a 73 year old female who presents to the clinic for evaluation of.  She was last seen in this clinic on 08/26/2022 by [facility] for evaluation of asthma, chronic rhinitis, recurrent infection, and acute bacterial sinusitis for which she received Augmentin  for 10 days.    At today's visit, she reports that, about 1 week ago, she developed a scratchy throat and nasal congestion. She denies fever, sweats, chills, or sick contacts. She reports her current symptoms include clear rhinorrhea, nasal congestion, sneezing, and post nasal drainage with frequent throat clearing.  She began using Astepro  a few days ago and is not currently taking an antihistamine or using nasal saline rinses.  She Her last environmental allergy  skin testing was 05/2021 and was negative to the environmental panel.  Asthma is reported as moderately well-controlled with dry cough as the main symptom.  She denies shortness of breath or wheeze with activity or rest.  She continues albuterol  infrequently with relief of symptoms.  She is not currently using Arnuity 200 for asthma maintenance.  Allergic conjunctivitis is reported as moderately well-controlled with occasional red and watery eyes for which she uses olopatadine  with relief of symptoms.  Reflux is reported as well-controlled with no symptoms including heartburn or vomiting.  She continues famotidine  twice a day with relief of symptoms.  She reports that she has not had an infection requiring an antibiotic since her last visit to this clinic.  However, she does report that she  continues to see her integrative medication providers who are worried about mold toxicity and has prescribed Diflucan .  She took this for a limited time and reported that she was having some relief from her symptoms, however, she reports this medication has expired at this time.  Her current medications are listed in the chart.  Drug Allergies:  Allergies  Allergen Reactions   Cefuroxime  Axetil Other (See Comments)    Extreme gas   Gabapentin  Other (See Comments)    Constipation Constipation   Montelukast  Other (See Comments)    Numbness and tingling in hand. Numbness and tingling in hand. Numbness and tingling in hand. NUMBNESS OF HANDS AND FEET. Numbness in hands and feet NUMBNESS OF HANDS AND FEET.   Pregabalin Other (See Comments)    Drowsiness, dry mouth Drowsiness, dry mouth Makes her tired and thirsty   Augmentin  [Amoxicillin -Pot Clavulanate] Diarrhea   Avelox [Moxifloxacin Hcl In Nacl] Other (See Comments)    Tremors    Biaxin  [Clarithromycin ]     Unsure of reaction   Cedax [Ceftibuten] Other (See Comments)    tremors   Ciprofloxacin  Other (See Comments) and Nausea And Vomiting    Blurred vision Blurred vision Pt can't remember reaction   Clindamycin/Lincomycin     tachycardia   Doxycycline  Diarrhea    Nausea , stomach upset   Levofloxacin Other (See Comments)    Feels dehydrated   Lincomycin Other (See Comments)    tachycardia   Montelukast  Sodium Other (See Comments)    Numbness and tingling in hand. Numbness and tingling in hand.   Sulfonamide Derivatives Other (See Comments)  Gi upset   Sulfa Antibiotics Nausea Only    Other reaction(s): Other (See Comments) Gi upset  Other reaction(s): Other (See Comments) Gi upset Other reaction(s): Unknown    Physical Exam: BP 102/60   Pulse 81   Temp 98.2 F (36.8 C) (Temporal)   Resp 12   Wt 118 lb 8 oz (53.8 kg)   SpO2 97%   BMI 18.02 kg/m    Physical Exam Vitals reviewed.  Constitutional:       Appearance: Normal appearance.  HENT:     Head: Normocephalic and atraumatic.     Right Ear: Tympanic membrane normal.     Left Ear: Tympanic membrane normal.     Nose:     Comments: Bilateral ears slightly erythematous with thin clear nasal drainage noted.  Pharynx normal.  Ears normal.  Eyes normal.    Mouth/Throat:     Pharynx: Oropharynx is clear.  Eyes:     Conjunctiva/sclera: Conjunctivae normal.  Cardiovascular:     Rate and Rhythm: Normal rate and regular rhythm.     Heart sounds: Normal heart sounds. No murmur heard. Pulmonary:     Effort: Pulmonary effort is normal.     Breath sounds: Normal breath sounds.     Comments: Lungs clear to auscultation Musculoskeletal:        General: Normal range of motion.     Cervical back: Normal range of motion and neck supple.  Skin:    General: Skin is warm and dry.  Neurological:     Mental Status: She is alert and oriented to person, place, and time.  Psychiatric:        Mood and Affect: Mood normal.        Behavior: Behavior normal.        Thought Content: Thought content normal.        Judgment: Judgment normal.     Assessment and Plan: 1. Moderate persistent asthma without complication   2. Nonallergic rhinitis   3. Chronic conjunctivitis of both eyes, unspecified chronic conjunctivitis type   4. Gastroesophageal reflux disease, unspecified whether esophagitis present   5. Recurrent infections     Meds ordered this encounter  Medications   predniSONE  (DELTASONE ) 10 MG tablet    Sig: Take 1 tablet (10 mg total) by mouth daily with breakfast.    Dispense:  9 tablet    Refill:  0    Patient Instructions  Asthma Continue Arnuity 200-1 puff once a day to prevent cough or wheeze.  Pretreat with albuterol  for the next few days, then use albuterol  as needed Continue albuterol  2 puffs once every 4 hours if needed for cough or wheeze  Chronic rhinitis Begin Flonase  2 sprays in each nostril once a day for nasal  congestion Continue azelastine  2 sprays in each nostril twice a day if needed for runny nose Begin saline nasal rinses as needed for nasal symptoms. Use this before any medicated nasal sprays for best result For thick postnasal drainage, begin Mucinex  600 to 1200 mg twice a day Hold your antihistamine for the next week and then begin Claritin  only if needed for runny nose or itch.  Claritin  may cause drowsiness If the treatment plan as listed above is not effective, begin prednisone  taper. Prednisone  10 mg tablets. Take 2 tablets once a day for 4 days, then take 1 tablet on the 5th day, then stop.    Allergic conjunctivitis Continue olopatadine  1 drop in each eye once a day as needed for  red or itchy eyes  Reflux Continue dietary and lifestyle modifications as listed below  Recurrent infection Keep track of infections, antibiotic use, and steroid use  Call the clinic if this treatment plan is not working well for you.  Follow up in 2 months or sooner if needed.  Return in about 2 months (around 06/04/2023), or if symptoms worsen or fail to improve.    Thank you for the opportunity to care for this patient.  Please do not hesitate to contact me with questions.  Arlean Mutter, FNP [facility] 

## 2023-04-07 ENCOUNTER — Ambulatory Visit: Payer: Medicare HMO | Admitting: Family Medicine

## 2023-04-08 ENCOUNTER — Encounter: Payer: Self-pay | Admitting: Internal Medicine

## 2023-04-08 MED ORDER — AMOXICILLIN-POT CLAVULANATE 875-125 MG PO TABS: 1.0000 | ORAL_TABLET | Freq: Two times a day (BID) | ORAL | 0 refills | Status: AC

## 2023-04-08 NOTE — Progress Notes (Signed)
 Patient called on call line reporting worsening symptoms of drainage, congestion and now pain in her upper teeth. She was seen in clinic 2 days ago with symptoms present for one week and instructed to start prednisone  if no improvement in symptoms in one week.   Symptoms have progressively worsened and she has not started prednisone .   She has multiple antibiotic allergies, but reports tolerance to augmentin  despite it being on her allergy  list.   Plan: Start prednisone  20 mg by mouth daily x 4 days then 10 mg on day 5  Start Augmentin  875 mg twice daily by mouth for 10 days.  Take antibiotic with probiotics or live cultured yogurt.  Follow-up if no improvement in 48-72 hours.

## 2023-05-01 DIAGNOSIS — M62830 Muscle spasm of back: Secondary | ICD-10-CM | POA: Diagnosis not present

## 2023-05-01 DIAGNOSIS — M9902 Segmental and somatic dysfunction of thoracic region: Secondary | ICD-10-CM | POA: Diagnosis not present

## 2023-05-01 DIAGNOSIS — M9901 Segmental and somatic dysfunction of cervical region: Secondary | ICD-10-CM | POA: Diagnosis not present

## 2023-05-01 DIAGNOSIS — M9903 Segmental and somatic dysfunction of lumbar region: Secondary | ICD-10-CM | POA: Diagnosis not present

## 2023-05-05 ENCOUNTER — Other Ambulatory Visit: Payer: Self-pay | Admitting: Internal Medicine

## 2023-05-05 ENCOUNTER — Telehealth: Payer: Self-pay

## 2023-05-05 ENCOUNTER — Other Ambulatory Visit: Payer: Self-pay | Admitting: Family Medicine

## 2023-05-05 ENCOUNTER — Ambulatory Visit: Payer: Self-pay | Admitting: Family Medicine

## 2023-05-05 ENCOUNTER — Other Ambulatory Visit (HOSPITAL_COMMUNITY): Payer: Self-pay

## 2023-05-05 DIAGNOSIS — J4540 Moderate persistent asthma, uncomplicated: Secondary | ICD-10-CM

## 2023-05-05 NOTE — Telephone Encounter (Signed)
 Pharmacy Patient Advocate Encounter   Received notification from CoverMyMeds that prior authorization for Trulance  3MG  tablets is required/requested.   Insurance verification completed.   The patient is insured through [facility] .   Per test claim: PA required; PA submitted to above mentioned insurance via CoverMyMeds Key/confirmation #/EOC [id number] Status is pending   Pharmacy Patient Advocate Encounter  Received notification from [facility] that Prior Authorization for Trulance  has been APPROVED from 05/05/2023 to 03/27/2024   PA #/Case ID/Reference #: [id number]

## 2023-05-05 NOTE — Telephone Encounter (Signed)
 Copied from CRM [id number]. Topic: Clinical - Red Word Triage >> May 05, 2023  9:17 AM Susanna ORN wrote: Red Word that prompted transfer to Nurse Triage: Patient states she thinks she has a UTI. States she is burning & itching.  Chief Complaint: burning with urination Symptoms: burning, itching, pain Frequency: constant Pertinent Negatives: Patient denies fever, flank pain, frequency Disposition: [] ED /[] Urgent Care (no appt availability in office) / [] Appointment(In office/virtual)/ []  [facility]/ [] Home Care/ [] Refused Recommended Disposition /[] [facility]/ [x]  Follow-up with PCP Additional Notes: Patient would like to drop urine sample off at office.  Message sent to PCP office.  Care advice given and instructed to go to uc or er if becomes worse.  No apt available for today.  Reason for Disposition  Age > 50 years  Answer Assessment - Initial Assessment Questions 1. SEVERITY: How bad is the pain?  (e.g., Scale 1-10; mild, moderate, or severe)   - MILD (1-3): complains slightly about urination hurting   - MODERATE (4-7): interferes with normal activities     - SEVERE (8-10): excruciating, unwilling or unable to urinate because of the pain      7/10 2. FREQUENCY: How many times have you had painful urination today?      denies 3. PATTERN: Is pain present every time you urinate or just sometimes?      Every time, all the time 4. ONSET: When did the painful urination start?      Since Sunday  5. FEVER: Do you have a fever? If Yes, ask: What is your temperature, how was it measured, and when did it start?     denies 6. PAST UTI: Have you had a urine infection before? If Yes, ask: When was the last time? and What happened that time?      yes 7. CAUSE: What do you think is causing the painful urination?  (e.g., UTI, scratch, Herpes sore)     uti 8. OTHER SYMPTOMS: Do you have any other symptoms? (e.g., blood in urine, flank pain, genital sores,  urgency, vaginal discharge)     urgency 9. PREGNANCY: Is there any chance you are pregnant? When was your last menstrual period?     na  Protocols used: Urination Pain - Female-A-AH

## 2023-05-08 ENCOUNTER — Other Ambulatory Visit (HOSPITAL_COMMUNITY): Payer: Self-pay

## 2023-05-08 ENCOUNTER — Encounter: Payer: Self-pay | Admitting: Family Medicine

## 2023-05-08 ENCOUNTER — Ambulatory Visit (INDEPENDENT_AMBULATORY_CARE_PROVIDER_SITE_OTHER): Payer: Medicare HMO | Admitting: Family Medicine

## 2023-05-08 VITALS — BP 104/66 | HR 79 | Temp 98.3°F | Ht 68.0 in | Wt 117.4 lb

## 2023-05-08 DIAGNOSIS — R399 Unspecified symptoms and signs involving the genitourinary system: Secondary | ICD-10-CM | POA: Diagnosis not present

## 2023-05-08 DIAGNOSIS — M9903 Segmental and somatic dysfunction of lumbar region: Secondary | ICD-10-CM | POA: Diagnosis not present

## 2023-05-08 DIAGNOSIS — N952 Postmenopausal atrophic vaginitis: Secondary | ICD-10-CM

## 2023-05-08 DIAGNOSIS — M62830 Muscle spasm of back: Secondary | ICD-10-CM | POA: Diagnosis not present

## 2023-05-08 DIAGNOSIS — N898 Other specified noninflammatory disorders of vagina: Secondary | ICD-10-CM

## 2023-05-08 DIAGNOSIS — M9902 Segmental and somatic dysfunction of thoracic region: Secondary | ICD-10-CM | POA: Diagnosis not present

## 2023-05-08 DIAGNOSIS — M9901 Segmental and somatic dysfunction of cervical region: Secondary | ICD-10-CM | POA: Diagnosis not present

## 2023-05-08 LAB — WET PREP FOR TRICH, YEAST, CLUE
Clue Cell Exam: NEGATIVE
Trichomonas Exam: NEGATIVE
Yeast Exam: NEGATIVE

## 2023-05-08 LAB — URINALYSIS, ROUTINE W REFLEX MICROSCOPIC
Bilirubin, UA: NEGATIVE
Glucose, UA: NEGATIVE
Ketones, UA: NEGATIVE
Leukocytes,UA: NEGATIVE
Nitrite, UA: NEGATIVE
Protein,UA: NEGATIVE
RBC, UA: NEGATIVE
Specific Gravity, UA: 1.015 (ref 1.005–1.030)
Urobilinogen, Ur: 0.2 mg/dL (ref 0.2–1.0)
pH, UA: 7 (ref 5.0–7.5)

## 2023-05-08 MED ORDER — ESTRADIOL 0.1 MG/GM VA CREA: TOPICAL_CREAM | VAGINAL | 12 refills | Status: AC

## 2023-05-08 MED ORDER — CLOBETASOL PROPIONATE 0.05 % EX CREA: TOPICAL_CREAM | CUTANEOUS | 0 refills | Status: DC

## 2023-05-08 NOTE — Progress Notes (Signed)
 Subjective: CC: Vaginal irritation PCP: Eliodoro Guerin, DO Kristy Garza is a 73 y.o. female presenting to clinic today for:  1.  Vaginal irritation Patient reports about a week history of burning, itching of the vagina.  She has history of interstitial cystitis but was not sure if perhaps this could be related to that or bladder infection.  She denies any vaginal bleeding.  She has history of total hysterectomy.  She is been utilizing an over-the-counter lubricant, some topical lidocaine  in efforts to improve symptomology.  She used to be on vaginal Estrace  cream but this was prescribed by her urologist and she has not seen her urologist in a while.   ROS: Per HPI  Allergies  Allergen Reactions   Cefuroxime  Axetil Other (See Comments)    Extreme gas   Gabapentin  Other (See Comments)    Constipation Constipation   Montelukast  Other (See Comments)    Numbness and tingling in hand. Numbness and tingling in hand. Numbness and tingling in hand. NUMBNESS OF HANDS AND FEET. Numbness in hands and feet NUMBNESS OF HANDS AND FEET.   Pregabalin Other (See Comments)    Drowsiness, dry mouth Drowsiness, dry mouth Makes her tired and thirsty   Augmentin  [Amoxicillin -Pot Clavulanate] Diarrhea   Avelox [Moxifloxacin Hcl In Nacl] Other (See Comments)    Tremors    Biaxin  [Clarithromycin ]     Unsure of reaction   Cedax [Ceftibuten] Other (See Comments)    tremors   Ciprofloxacin  Other (See Comments) and Nausea And Vomiting    Blurred vision Blurred vision Pt can't remember reaction   Clindamycin/Lincomycin     tachycardia   Doxycycline  Diarrhea    Nausea , stomach upset   Levofloxacin Other (See Comments)    Feels dehydrated   Lincomycin Other (See Comments)    tachycardia   Montelukast  Sodium Other (See Comments)    Numbness and tingling in hand. Numbness and tingling in hand.   Sulfonamide Derivatives Other (See Comments)    Gi upset   Sulfa Antibiotics Nausea  Only    Other reaction(s): Other (See Comments) Gi upset  Other reaction(s): Other (See Comments) Gi upset Other reaction(s): Unknown   Past Medical History:  Diagnosis Date   Abdominal pain 01/16/2013   Acid reflux    Allergy     Arthritis    ARTHRITIS IN NECK BY DR. Brenita Callow [facility]   Asthma    Interstitial cystitis    Osteoporosis    PONV (postoperative nausea and vomiting)    Tinnitus    Trigeminal neuralgia    Atypical trigeminal neuralgia    Current Outpatient Medications:    acetaminophen  (TYLENOL ) 500 MG tablet, Take by mouth., Disp: , Rfl:    albuterol  (VENTOLIN  HFA) 108 (90 Base) MCG/ACT inhaler, TAKE 2 PUFFS BY MOUTH EVERY 6 HOURS AS NEEDED FOR WHEEZE OR SHORTNESS OF BREATH, Disp: 18 each, Rfl: 0   ARNUITY ELLIPTA  200 MCG/ACT AEPB, TAKE 1 PUFF BY MOUTH EVERY DAY, Disp: 30 each, Rfl: 5   ascorbic acid (VITAMIN C) 1000 MG tablet, Take by mouth., Disp: , Rfl:    aspirin-acetaminophen -caffeine  (EXCEDRIN MIGRAINE) 250-250-65 MG tablet, Take by mouth., Disp: , Rfl:    b complex vitamins capsule, Take 1 capsule by mouth daily., Disp: , Rfl:    butalbital -acetaminophen -caffeine  (FIORICET) 50-325-40 MG tablet, Take 1 tablet by mouth every 6 (six) hours as needed for headache., Disp: 14 tablet, Rfl: 0   calcium  citrate-vitamin D  (CITRACAL+D) 315-200 MG-UNIT tablet, Take by mouth., Disp: ,  Rfl:    Cholecalciferol (VITAMIN D3) 125 MCG (5000 UT) CAPS, Take 5,000 Units by mouth daily., Disp: , Rfl:    famotidine  (PEPCID ) 20 MG tablet, Take 20 mg by mouth 2 (two) times daily., Disp: , Rfl:    ferrous sulfate  (FE TABS) 325 (65 FE) MG EC tablet, Take 1 tablet (325 mg total) by mouth daily with breakfast., Disp: 90 tablet, Rfl: 1   hydrOXYzine  (ATARAX ) 25 MG tablet, Take 25 mg by mouth at bedtime., Disp: , Rfl:    hyoscyamine (LEVSIN SL) 0.125 MG SL tablet, Place under the tongue every 4 (four) hours as needed., Disp: , Rfl:    lidocaine  (XYLOCAINE ) 2 % jelly, APPLY TOPICALLY TO AFFECTED  AREA EVERY DAY AS NEEDED (use sparingly), Disp: 85 g, Rfl: 0   Lidocaine -Collagen-Aloe Vera (REGENECARE) 2 % GEL, Apply topically daily., Disp: 85 g, Rfl: 2   melatonin 5 MG TABS, Take 5 mg by mouth., Disp: , Rfl:    Meth-Hyo-M Bl-Na Phos-Ph Sal (URO-MP) 118 MG CAPS, Take 1 capsule by mouth 4 (four) times daily as needed., Disp: , Rfl:    NALTREXONE HCL PO, Take by mouth., Disp: , Rfl:    olopatadine  (PATANOL) 0.1 % ophthalmic solution, Place 1 drop into both eyes 2 (two) times daily as needed for allergies., Disp: 5 mL, Rfl: 5   Olopatadine -Mometasone  (RYALTRIS ) 665-25 MCG/ACT SUSP, Place 1-2 sprays into the nose in the morning and at bedtime., Disp: 29 g, Rfl: 5   phenazopyridine  (PYRIDIUM ) 200 MG tablet, Take 1 tablet (200 mg total) by mouth 4 (four) times daily as needed for pain (urinary frequency &/or pain)., Disp: 12 tablet, Rfl: 0   Polyethylene Glycol 3350  (MIRALAX  PO), Take by mouth as needed., Disp: , Rfl:    praziquantel (BILTRICIDE) 600 MG tablet, Take 600 mg by mouth 3 (three) times daily., Disp: , Rfl:    predniSONE  (DELTASONE ) 10 MG tablet, Take 1 tablet (10 mg total) by mouth daily with breakfast., Disp: 9 tablet, Rfl: 0   Probiotic Product (PROBIOTIC PO), Take by mouth daily., Disp: , Rfl:    sennosides-docusate sodium  (SENOKOT-S) 8.6-50 MG tablet, Take 1 tablet by mouth daily., Disp: 30 tablet, Rfl: 0   Simethicone (GAS-X PO), Take by mouth., Disp: , Rfl:    tizanidine  (ZANAFLEX ) 2 MG capsule, Take 1-2 capsules (2-4 mg total) by mouth 3 (three) times daily as needed for muscle spasms., Disp: 60 capsule, Rfl: 2   traMADol  (ULTRAM ) 50 MG tablet, Take 1 tablet (50 mg total) by mouth every 12 (twelve) hours as needed., Disp: 30 tablet, Rfl: 1   UNABLE TO FIND, Take 1 capsule by mouth daily. Dv3- vitamin D  3 plus immune support, Disp: , Rfl:  Social History   Socioeconomic History   Marital status: Married    Spouse name: Dovie Gell   Number of children: 3   Years of education: 12    Highest education level: Some college, no degree  Occupational History   Occupation: CNA    Comment: Retired  Tobacco Use   Smoking status: Never   Smokeless tobacco: Never  Vaping Use   Vaping status: Never Used  Substance and Sexual Activity   Alcohol use: No    Alcohol/week: 0.0 standard drinks of alcohol    Comment: 01-19-2016 per pt no   Drug use: No    Comment: 01-19-2016 per pt no    Sexual activity: Not Currently    Birth control/protection: Surgical  Other Topics Concern   Not  on file  Social History Narrative   Lives with husband.    Social Drivers of Corporate investment banker Strain: Low Risk  (07/14/2022)   Overall Financial Resource Strain (CARDIA)    Difficulty of Paying Living Expenses: Not hard at all  Food Insecurity: No Food Insecurity (07/14/2022)   Hunger Vital Sign    Worried About Running Out of Food in the Last Year: Never true    Ran Out of Food in the Last Year: Never true  Transportation Needs: No Transportation Needs (07/14/2022)   PRAPARE - Administrator, Civil Service (Medical): No    Lack of Transportation (Non-Medical): No  Physical Activity: Insufficiently Active (07/14/2022)   Exercise Vital Sign    Days of Exercise per Week: 3 days    Minutes of Exercise per Session: 30 min  Stress: No Stress Concern Present (07/14/2022)   [employer] of Occupational Health - Occupational Stress Questionnaire    Feeling of Stress : Not at all  Social Connections: Moderately Integrated (07/14/2022)   Social Connection and Isolation Panel [NHANES]    Frequency of Communication with Friends and Family: More than three times a week    Frequency of Social Gatherings with Friends and Family: More than three times a week    Attends Religious Services: 1 to 4 times per year    Active Member of [employer] or Organizations: No    Attends Banker Meetings: Never    Marital Status: Married  Catering manager Violence: Not At Risk (07/14/2022)    Humiliation, Afraid, Rape, and Kick questionnaire    Fear of Current or Ex-Partner: No    Emotionally Abused: No    Physically Abused: No    Sexually Abused: No   Family History  Problem Relation Age of Onset   Hyperlipidemia Mother    Arthritis Mother    Cancer Mother    Lymphoma Mother    Cancer Father    Allergies Father    Allergies Sister    Allergies Sister    Allergic rhinitis Neg Hx    Angioedema Neg Hx    Asthma Neg Hx    Eczema Neg Hx    Immunodeficiency Neg Hx    Urticaria Neg Hx     Objective: Office vital signs reviewed. BP 104/66   Pulse 79   Temp 98.3 F (36.8 C)   Ht 5\' 8"  (1.727 m)   Wt 117 lb 6.4 oz (53.3 kg)   SpO2 95%   BMI 17.85 kg/m   Physical Examination:  General: Awake, alert, thin female, No acute distress GU: external vaginal tissue atrophic, no gross vaginal discharge or bleeding appreciated.  She has some hyperemia and thickening of the labia majora bilaterally.  No appreciable skin breakdown or bleeding.  I did not appreciate any lichenification at the introitus    Assessment/ Plan: 73 y.o. female   Vaginal irritation - Plan: Urinalysis, Routine w reflex microscopic, estradiol  (ESTRACE  VAGINAL) 0.1 MG/GM vaginal cream, clobetasol  cream (TEMOVATE ) 0.05 %, WET PREP FOR TRICH, YEAST, CLUE, CANCELED: Urine Culture  Vaginal atrophy  Urinalysis unremarkable.  Cancel urine culture.  I suspect this is secondary to vaginal atrophy and lack of estrogen.  I have renewed her estradiol  to apply up to 3 times weekly as needed.  I also given her a backup clobetasol  cream to use in affected areas if needed for persistent itching but discussed utilizing this sparingly.  She will contact me if she ends up needing  uses.  If both therapies are ineffective, plan for referral to gynecology   Eliodoro Guerin, DO [facility] [phone]

## 2023-05-10 ENCOUNTER — Ambulatory Visit: Payer: Self-pay | Admitting: Family Medicine

## 2023-05-10 NOTE — Telephone Encounter (Signed)
  Chief Complaint: Rectal bleeding. Symptoms: Black stools, blood when wiping, rectal pain, straining, dizziness, greasy stools  Frequency: With BMs Pertinent Negatives: Patient denies abdominal pain, n/v, fever, diarrhea Disposition: [x] ED /[] Urgent Care (no appt availability in office) / [] Appointment(In office/virtual)/ []  [place] [facility]/ [] Home Care/ [x] Refused Recommended Disposition /[] [place] [facility]/ []  Follow-up with PCP Additional Notes: Patient called with questions about how to take medications estradiol cream and temovate and complaints of rectal bleeding. Patient states that she has a history of hemorrhoids and is seeing bleeding with wiping. Patient states that her stools are black and sometimes greasy, some rectal discomfort is present, and has been having to take miralax and drink prune juice sometimes to go. Patient states that these symptoms have been present for about a week, but denies any n/v, abdominal pain, fever, or taking of iron pills. This RN provided education on patient's medication administration instructions and advised patient that she should go to the ER per protocol due to symptoms presenting for possible GI bleeding. Patient refused ER and was advised by this RN note will be routed to office for PCP review. Patient verbalized understanding. CAL unable to be notified due to closing.  Copied from CRM [id number]. Topic: Clinical - Red Word Triage >> May 10, 2023  4:24 PM Elle L wrote: Red Word that prompted transfer to Nurse Triage: The patient was calling to see how to use the clobetasol cream and estradiol (ESTRACE VAGINAL) 0.1 MG that was prescribed to her at her last appointment. However, the patient also informed me that she has been bleeding when having bowel movements and it is causing pain. Reason for Disposition  Black or tarry bowel movements  (Exception: Chronic-unchanged black-grey BMs AND is taking iron pills or Pepto-Bismol.)  Protocols used:  Rectal Bleeding-A-AH

## 2023-05-11 ENCOUNTER — Ambulatory Visit: Payer: Medicare HMO | Admitting: Family Medicine

## 2023-05-11 DIAGNOSIS — Z5321 Procedure and treatment not carried out due to patient leaving prior to being seen by health care provider: Secondary | ICD-10-CM | POA: Diagnosis not present

## 2023-05-11 DIAGNOSIS — K921 Melena: Secondary | ICD-10-CM | POA: Diagnosis not present

## 2023-05-11 DIAGNOSIS — R195 Other fecal abnormalities: Secondary | ICD-10-CM | POA: Diagnosis not present

## 2023-05-11 NOTE — Telephone Encounter (Signed)
Very important that she go to E.D.!!!

## 2023-05-11 NOTE — Telephone Encounter (Signed)
Lmtcb

## 2023-05-12 DIAGNOSIS — D649 Anemia, unspecified: Secondary | ICD-10-CM | POA: Diagnosis not present

## 2023-05-12 DIAGNOSIS — K581 Irritable bowel syndrome with constipation: Secondary | ICD-10-CM | POA: Diagnosis not present

## 2023-05-12 DIAGNOSIS — R5383 Other fatigue: Secondary | ICD-10-CM | POA: Diagnosis not present

## 2023-05-12 DIAGNOSIS — K921 Melena: Secondary | ICD-10-CM | POA: Diagnosis not present

## 2023-05-12 NOTE — Telephone Encounter (Signed)
Agree ER if rectal bleeding.

## 2023-05-12 NOTE — Telephone Encounter (Signed)
Pt informed. Feeling better now. Pt instructed to rach out to GI and go to ER is symptoms come back. Pt reports she went to the Er and waiting 5 hrs without being seen. LS

## 2023-05-15 DIAGNOSIS — M9903 Segmental and somatic dysfunction of lumbar region: Secondary | ICD-10-CM | POA: Diagnosis not present

## 2023-05-15 DIAGNOSIS — M62830 Muscle spasm of back: Secondary | ICD-10-CM | POA: Diagnosis not present

## 2023-05-15 DIAGNOSIS — M9902 Segmental and somatic dysfunction of thoracic region: Secondary | ICD-10-CM | POA: Diagnosis not present

## 2023-05-15 DIAGNOSIS — M9901 Segmental and somatic dysfunction of cervical region: Secondary | ICD-10-CM | POA: Diagnosis not present

## 2023-05-19 DIAGNOSIS — N3020 Other chronic cystitis without hematuria: Secondary | ICD-10-CM | POA: Diagnosis not present

## 2023-05-19 DIAGNOSIS — N3010 Interstitial cystitis (chronic) without hematuria: Secondary | ICD-10-CM | POA: Diagnosis not present

## 2023-05-25 DIAGNOSIS — M9901 Segmental and somatic dysfunction of cervical region: Secondary | ICD-10-CM | POA: Diagnosis not present

## 2023-05-25 DIAGNOSIS — M62830 Muscle spasm of back: Secondary | ICD-10-CM | POA: Diagnosis not present

## 2023-05-25 DIAGNOSIS — M9903 Segmental and somatic dysfunction of lumbar region: Secondary | ICD-10-CM | POA: Diagnosis not present

## 2023-05-25 DIAGNOSIS — M9902 Segmental and somatic dysfunction of thoracic region: Secondary | ICD-10-CM | POA: Diagnosis not present

## 2023-05-27 ENCOUNTER — Other Ambulatory Visit: Payer: Self-pay | Admitting: Family Medicine

## 2023-05-27 DIAGNOSIS — J4540 Moderate persistent asthma, uncomplicated: Secondary | ICD-10-CM

## 2023-06-07 DIAGNOSIS — M62830 Muscle spasm of back: Secondary | ICD-10-CM | POA: Diagnosis not present

## 2023-06-07 DIAGNOSIS — M9901 Segmental and somatic dysfunction of cervical region: Secondary | ICD-10-CM | POA: Diagnosis not present

## 2023-06-07 DIAGNOSIS — M9903 Segmental and somatic dysfunction of lumbar region: Secondary | ICD-10-CM | POA: Diagnosis not present

## 2023-06-07 DIAGNOSIS — M9902 Segmental and somatic dysfunction of thoracic region: Secondary | ICD-10-CM | POA: Diagnosis not present

## 2023-06-13 ENCOUNTER — Ambulatory Visit: Payer: Medicare HMO | Admitting: Allergy and Immunology

## 2023-06-15 DIAGNOSIS — M9903 Segmental and somatic dysfunction of lumbar region: Secondary | ICD-10-CM | POA: Diagnosis not present

## 2023-06-15 DIAGNOSIS — M62830 Muscle spasm of back: Secondary | ICD-10-CM | POA: Diagnosis not present

## 2023-06-15 DIAGNOSIS — M9901 Segmental and somatic dysfunction of cervical region: Secondary | ICD-10-CM | POA: Diagnosis not present

## 2023-06-15 DIAGNOSIS — M9902 Segmental and somatic dysfunction of thoracic region: Secondary | ICD-10-CM | POA: Diagnosis not present

## 2023-06-28 DIAGNOSIS — M9901 Segmental and somatic dysfunction of cervical region: Secondary | ICD-10-CM | POA: Diagnosis not present

## 2023-06-28 DIAGNOSIS — M9903 Segmental and somatic dysfunction of lumbar region: Secondary | ICD-10-CM | POA: Diagnosis not present

## 2023-06-28 DIAGNOSIS — M62830 Muscle spasm of back: Secondary | ICD-10-CM | POA: Diagnosis not present

## 2023-06-28 DIAGNOSIS — M9902 Segmental and somatic dysfunction of thoracic region: Secondary | ICD-10-CM | POA: Diagnosis not present

## 2023-06-29 ENCOUNTER — Ambulatory Visit: Admitting: Allergy

## 2023-07-19 NOTE — Progress Notes (Signed)
 This encounter was created in error - please disregard. Reschedule awv due pt not feeling well-alia t/cma

## 2023-07-20 DIAGNOSIS — K581 Irritable bowel syndrome with constipation: Secondary | ICD-10-CM | POA: Diagnosis not present

## 2023-07-20 DIAGNOSIS — R194 Change in bowel habit: Secondary | ICD-10-CM | POA: Diagnosis not present

## 2023-07-21 DIAGNOSIS — N3000 Acute cystitis without hematuria: Secondary | ICD-10-CM | POA: Diagnosis not present

## 2023-07-24 DIAGNOSIS — R194 Change in bowel habit: Secondary | ICD-10-CM | POA: Diagnosis not present

## 2023-07-25 ENCOUNTER — Telehealth: Payer: Self-pay

## 2023-07-25 NOTE — Telephone Encounter (Signed)
 Pt is due for mobile mammogram screening

## 2023-07-27 ENCOUNTER — Ambulatory Visit: Payer: Self-pay

## 2023-07-27 ENCOUNTER — Ambulatory Visit: Admitting: Family Medicine

## 2023-07-27 VITALS — BP 119/73 | HR 75 | Temp 97.5°F | Ht 68.0 in | Wt 120.0 lb

## 2023-07-27 DIAGNOSIS — J301 Allergic rhinitis due to pollen: Secondary | ICD-10-CM | POA: Diagnosis not present

## 2023-07-27 DIAGNOSIS — R300 Dysuria: Secondary | ICD-10-CM

## 2023-07-27 DIAGNOSIS — N3010 Interstitial cystitis (chronic) without hematuria: Secondary | ICD-10-CM | POA: Diagnosis not present

## 2023-07-27 LAB — URINALYSIS, ROUTINE W REFLEX MICROSCOPIC
Bilirubin, UA: NEGATIVE
Glucose, UA: NEGATIVE
Ketones, UA: NEGATIVE
Leukocytes,UA: NEGATIVE
Nitrite, UA: NEGATIVE
Protein,UA: NEGATIVE
RBC, UA: NEGATIVE
Specific Gravity, UA: <1.005 — ABNORMAL LOW (ref 1.005–1.030)
Urobilinogen, Ur: 0.2 mg/dL (ref 0.2–1.0)
pH, UA: 6 (ref 5.0–7.5)

## 2023-07-27 MED ORDER — MOMETASONE FUROATE 50 MCG/ACT NA SUSP: 2.0000 | Freq: Every day | NASAL | 12 refills | Status: AC

## 2023-07-27 MED ORDER — NITROFURANTOIN MONOHYD MACRO 100 MG PO CAPS: 100.0000 mg | ORAL_CAPSULE | Freq: Two times a day (BID) | ORAL | 0 refills | Status: DC

## 2023-07-27 MED ORDER — AZELASTINE HCL 0.1 % NA SOLN: 2.0000 | Freq: Two times a day (BID) | NASAL | 12 refills | Status: AC

## 2023-07-27 NOTE — Telephone Encounter (Signed)
 Chief Complaint: UTI Symptoms: burning with urination, itching, increased frequency, back pain Frequency: since Sunday Pertinent Negatives: Patient denies fever Disposition: [] ED /[] Urgent Care (no appt availability in office) / [x] Appointment(In office/virtual)/ []  [place] [facility]/ [] Home Care/ [] Refused Recommended Disposition /[] [place] [facility]/ []  Follow-up with PCP Additional Notes: Patient called in stating she believes she has a UTI, as she had had symptoms of burning with urination, constant vaginal discomfort, vaginal itching, and increased urinary frequency. Patient states she took a home urine test strep this morning for UTI this morning and it tested positive. Patient appt made for further evaluation.    Reason for Disposition  Side (flank) or lower back pain present  Answer Assessment - Initial Assessment Questions 1. SYMPTOM: "What's the main symptom you're concerned about?" (e.g., frequency, incontinence)     Burning while urinating, itching, urinating more often, pain while not urinating, back pain 2. ONSET: "When did the  symptoms  start?"     Sunday 3. PAIN: "Is there any pain?" If Yes, ask: "How bad is it?" (Scale: 1-10; mild, moderate, severe)     7 4. CAUSE: "What do you think is causing the symptoms?"     UTI 5. OTHER SYMPTOMS: "Do you have any other symptoms?" (e.g., blood in urine, fever, flank pain, pain with urination)     Burning while urinating, itching, urinating more often, pain while not urinating, back pain  Protocols used: Urinary Symptoms-A-AH

## 2023-07-27 NOTE — Progress Notes (Unsigned)
 Subjective:  Patient ID: Kristy Garza, female    DOB: 1950/06/22  Age: 73 y.o. MRN: [id number]  CC: Urinary Tract Infection (Already has cystitis but did at home test and it showed pos for UTI. Hard to say when symptoms started. No longer, No fever. )   HPI Kristy Garza presents for frequent UTIs.  She has frequency and some dysuria.  Due to history of interstitial cystitis she is not quite sure exactly when the symptoms started.  Patient has allergic rhinitis symptoms including sneezing frequently sniffling, clear rhinorrhea, watery and itchy eyes. There has been no fever no chills no sweats. No earaches. There is some scratchy throat but no sore throat or difficulty swallowing. There is some nasal congestion.      07/27/2023   11:14 AM 05/08/2023    8:21 AM 03/24/2023   11:14 AM  Depression screen PHQ 2/9  Decreased Interest 0 0 0  Down, Depressed, Hopeless 0 0 0  PHQ - 2 Score 0 0 0  Altered sleeping 1 0   Tired, decreased energy 1 0   Change in appetite 0 0   Feeling bad or failure about yourself  0 0   Trouble concentrating 0 0   Moving slowly or fidgety/restless 0 0   Suicidal thoughts 0 0   PHQ-9 Score 2 0   Difficult doing work/chores Not difficult at all Not difficult at all     History Kristy Garza has a past medical history of Abdominal pain (01/16/2013), Acid reflux, Allergy , Arthritis, Asthma, Interstitial cystitis, Osteoporosis, PONV (postoperative nausea and vomiting), Tinnitus, and Trigeminal neuralgia.   She has a past surgical history that includes Abdominal hysterectomy (2000); vocal cord surgery  (2130'Q); Ethmoidectomy (2012); Septoplasty (1980's); and Cystoscopy with hydrodistension and biopsy (N/A, 06/11/2012).   Her family history includes Allergies in her father, sister, and sister; Arthritis in her mother; Cancer in her father and mother; Hyperlipidemia in her mother; Lymphoma in her mother.She reports that she has never smoked. She has never used  smokeless tobacco. She reports that she does not drink alcohol and does not use drugs.    ROS Review of Systems  Constitutional: Negative.   HENT:  Positive for congestion and rhinorrhea.   Eyes:  Negative for visual disturbance.  Respiratory:  Negative for shortness of breath.   Cardiovascular:  Negative for chest pain.  Gastrointestinal:  Negative for abdominal pain.  Genitourinary:  Positive for dysuria and frequency.  Musculoskeletal:  Negative for arthralgias.    Objective:  BP 119/73   Pulse 75   Temp (!) 97.5 F (36.4 C)   Ht 5\' 8"  (1.727 m)   Wt 120 lb (54.4 kg)   SpO2 98%   BMI 18.25 kg/m   BP Readings from Last 3 Encounters:  07/27/23 119/73  05/08/23 104/66  04/06/23 102/60    Wt Readings from Last 3 Encounters:  07/27/23 120 lb (54.4 kg)  05/08/23 117 lb 6.4 oz (53.3 kg)  04/06/23 118 lb 8 oz (53.8 kg)     Physical Exam Constitutional:      Appearance: She is well-developed.  HENT:     Head: Normocephalic and atraumatic.  Cardiovascular:     Rate and Rhythm: Normal rate and regular rhythm.     Heart sounds: No murmur heard. Pulmonary:     Effort: Pulmonary effort is normal.     Breath sounds: Normal breath sounds.  Abdominal:     General: Bowel sounds are normal.  Palpations: Abdomen is soft. There is no mass.     Tenderness: There is no abdominal tenderness. There is no guarding or rebound.  Musculoskeletal:        General: No tenderness.  Skin:    General: Skin is warm and dry.  Neurological:     Mental Status: She is alert and oriented to person, place, and time.  Psychiatric:        Behavior: Behavior normal.      Assessment & Plan:  Dysuria -     Urinalysis, Routine w reflex microscopic -     Urine Culture  Chronic interstitial cystitis  Seasonal allergic rhinitis due to pollen  Other orders -     Mometasone  Furoate; Place 2 sprays into the nose daily.  Dispense: 17 g; Refill: 12 -     Azelastine  HCl; Place 2 sprays  into both nostrils 2 (two) times daily. Use in each nostril as directed  Dispense: 30 mL; Refill: 12 -     Nitrofurantoin  Monohyd Macro; Take 1 capsule (100 mg total) by mouth 2 (two) times daily.  Dispense: 14 capsule; Refill: 0     Follow-up: No follow-ups on file.  Kristy Garza, M.D.

## 2023-07-28 ENCOUNTER — Ambulatory Visit: Payer: Self-pay

## 2023-07-28 LAB — URINE CULTURE

## 2023-07-28 MED ORDER — ONDANSETRON HCL 4 MG PO TABS: 4.0000 mg | ORAL_TABLET | Freq: Three times a day (TID) | ORAL | 1 refills | Status: DC | PRN

## 2023-07-28 MED ORDER — ONDANSETRON HCL 4 MG PO TABS: 4.0000 mg | ORAL_TABLET | Freq: Three times a day (TID) | ORAL | 1 refills | Status: AC | PRN

## 2023-07-28 NOTE — Addendum Note (Signed)
 Addended by: Geni Skorupski D on: 07/28/2023 02:55 PM   Modules accepted: Orders

## 2023-07-28 NOTE — Telephone Encounter (Signed)
 Script printed twice, called script to pharmacy

## 2023-07-28 NOTE — Telephone Encounter (Signed)
 Zofran  was set to Phone In, resent

## 2023-07-28 NOTE — Telephone Encounter (Signed)
 Chief Complaint: worsening symptoms Symptoms: nausea, restless legs and pain in legs at night (states she was unable to sleep), headache, sinus congestion Frequency: worsening since seen for OV yesterday Pertinent Negatives: Patient denies fever Disposition: [] ED /[] Urgent Care (no appt availability in office) / [] Appointment(In office/virtual)/ []  [place] [facility]/ [] Home Care/ [] Refused Recommended Disposition /[] [place] [facility]/ [x]  Follow-up with PCP Additional Notes: Patient states she has been taking the medications prescribed yesterday by Dr Veleta Gerold and additionally OTC pain medication and AZO. She states the pain is still severe. Advised patient message will be sent to provider and for her to call back for any new or worsening symptoms.   Copied from CRM [id number]. Topic: Clinical - Medication Question >> Jul 28, 2023  9:17 AM Elle L wrote: Reason for CRM: The patient is nauseaus from the antibiotic that she was prescribed nitrofurantoin , macrocrystal-monohydrate, (MACROBID ) 100 MG capsule and is requesting if a replacement medication can be called in. The patient's call back number is [phone]. Reason for Disposition  [1] SEVERE pain (e.g., excruciating, pain scale 8-10) AND [2] not improved after pain medications  Answer Assessment - Initial Assessment Questions 1. MAIN CONCERN OR SYMPTOM:  "What is your main concern right now?" "What question do you have?" "What's the main symptom you're worried about?" (e.g., breathing difficulty, cough, fever. pain)     Patient states she has a burning sensation all the time, not just when she urinates. Leg pain and cramps throughout last night. She states she has been drinking a lot of water  and feels dehydrated. She states she has congestion and a headache. Nausea after taking the second dose of antibiotic (she states she took it with food).  2. ONSET: "When did the  symptoms  start?"     Monday.  3. BETTER-SAME-WORSE: "Are you  getting better, staying the same, or getting worse compared to how you felt at your last visit to the doctor (most recent medical visit)?"     She states a little worse.  4. VISIT DATE: "When were you seen?" (Date)     Yesterday.  5. VISIT DOCTOR: "What is the name of the doctor taking care of you now?"     Dr Veleta Gerold.  6. VISIT DIAGNOSIS:  "What was the main symptom or problem that you were seen for?" "Were you given a diagnosis?"      UTI and cystitis.  7. VISIT MEDICINES: "Did the doctor order any new medicines for you to use?" If Yes, ask: "Have you filled the prescription and started taking the medicine?"      Yes and she states she has started the medications that were sent in and took OTC AZO.  8. NEXT APPOINTMENT: "Have you scheduled a follow-up appointment with your doctor?"     08/11/23.  9. PAIN: "Is there any pain?" If Yes, ask: "How bad is it?"  (Scale 0-10; or mild, moderate, severe)    - NONE (0): no pain    - MILD (1-3): doesn't interfere with normal activities     - MODERATE (4-7): interferes with normal activities or awakens from sleep     - SEVERE (8-10): excruciating pain, unable to do any normal activities    "Urethra" burning 8-9/10.  10. FEVER: "Do you have a fever?" If Yes, ask: "What is it, how was it measured  and when did it start?"       Denies.  11. OTHER SYMPTOMS: "Do you have any other symptoms?"  Headache.  Protocols used: Recent Medical Visit for Illness Follow-up Call-A-AH

## 2023-07-28 NOTE — Telephone Encounter (Signed)
 Send her ondansetron  4 mg 3 times daily as needed to help with the nausea and have her finish the antibiotic and take the nausea pill about 30 minutes before she takes each pill, #20, no refill.  Also have her take the antibiotic with food

## 2023-07-28 NOTE — Telephone Encounter (Signed)
 Pt made aware. Zofran  sent to [facility].

## 2023-07-28 NOTE — Addendum Note (Signed)
 Addended by: Shade Kaley D on: 07/28/2023 03:03 PM   Modules accepted: Orders

## 2023-07-30 ENCOUNTER — Encounter: Payer: Self-pay | Admitting: Family Medicine

## 2023-07-31 ENCOUNTER — Telehealth: Payer: Self-pay

## 2023-07-31 NOTE — Telephone Encounter (Signed)
 Spoke with pt. Made aware of urine culture results. Pt states that she does not have any uti symptoms and that she d/c the ATB due to nausea. Informed pt that Dr. Veleta Gerold has not reviewed the culture and that she would get another call with his recommendations. Pt understood and has no further concerns.

## 2023-07-31 NOTE — Telephone Encounter (Signed)
 Copied from CRM [id number]. Topic: Clinical - Lab/Test Results >> Jul 31, 2023 10:38 AM Karole Pacer C wrote: Reason for CRM: Patient would like a call back to discuss her lab results. Patient's call back # is [phone].

## 2023-07-31 NOTE — Telephone Encounter (Signed)
 Number not in service.

## 2023-08-02 ENCOUNTER — Ambulatory Visit (INDEPENDENT_AMBULATORY_CARE_PROVIDER_SITE_OTHER): Admitting: Nurse Practitioner

## 2023-08-02 ENCOUNTER — Encounter: Payer: Self-pay | Admitting: Nurse Practitioner

## 2023-08-02 VITALS — BP 137/82 | HR 84 | Temp 99.4°F | Ht 68.0 in | Wt 119.6 lb

## 2023-08-02 DIAGNOSIS — M9901 Segmental and somatic dysfunction of cervical region: Secondary | ICD-10-CM | POA: Diagnosis not present

## 2023-08-02 DIAGNOSIS — G4700 Insomnia, unspecified: Secondary | ICD-10-CM | POA: Diagnosis not present

## 2023-08-02 DIAGNOSIS — N3010 Interstitial cystitis (chronic) without hematuria: Secondary | ICD-10-CM | POA: Diagnosis not present

## 2023-08-02 DIAGNOSIS — M62830 Muscle spasm of back: Secondary | ICD-10-CM | POA: Diagnosis not present

## 2023-08-02 DIAGNOSIS — M9902 Segmental and somatic dysfunction of thoracic region: Secondary | ICD-10-CM | POA: Diagnosis not present

## 2023-08-02 DIAGNOSIS — M9903 Segmental and somatic dysfunction of lumbar region: Secondary | ICD-10-CM | POA: Diagnosis not present

## 2023-08-02 NOTE — Progress Notes (Addendum)
 Acute Office Visit  Subjective:     Patient ID: Kristy Garza, female    DOB: [birth year], 74 y.o.   MRN: [id number]  Chief Complaint  Patient presents with   bladder pain    Bladder pain, urethra burning for 2 weeks   Headache    2 weeks   Leg Pain    HPI Kristy Garza is a 73 year old female presents Aug 02, 2023 for an acute visit concern for bladder pain and insomnia Insomnia The patient is a 73 year old female presenting for an acute visit with concerns of insomnia and bladder pain. She reports difficulty sleeping, stating she was awake 2-3 times during the night. She has been taking Melatonin 5 mg and Best Rest formula but continues to experience sleep disturbances. She attributes her insomnia partly to bladder pain, which she states has been ongoing. She was evaluated by Dr. Veleta Gerold on 07/27/2023 and diagnosed with chronic interstitial cystitis, for which she was prescribed Macrobid . However, she only took the medication for 3 days and discontinued it, stating, "I don't like taking medications." She also reports that the urine culture was negative for bacterial growth. The patient saw an acupuncturist recently, who advised that she may be taking too many medications; she was advised to follow up with her primary care provider regarding this concern. She also reports that she typically watches TV until bedtime, which may be contributing to her difficulty falling asleep. She has a follow-up appointment scheduled with her urologist tomorrow. 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   Back ache 01/16/2013   Chronic constipation 06/22/2015   Chronic interstitial cystitis 01/16/2013   Face pain 01/22/2014   Gastroesophageal reflux disease 03/11/2013   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   FOM (frequency of micturition) 01/16/2013   HLD (hyperlipidemia) 01/07/2016   Anxiety state 01/19/2016   Adjustment disorder with anxious mood 01/19/2016   Adult BMI <19 kg/sq m 04/13/2018   Malnutrition of moderate degree (HCC) 05/23/2018   Age-related osteoporosis without current pathological fracture 01/04/2019   Moderate persistent asthma without complication 06/16/2020   History of adenomatous polyp of colon 07/02/2018   Incontinence of feces 04/12/2018   Iron deficiency anemia, unspecified 04/17/2020   Raynaud phenomenon 06/16/2020   Tear film insufficiency 06/16/2020   Unspecified behavioral syndromes associated with physiological disturbances and physical factors 06/16/2020   Inflammatory arthritis 06/16/2020   Recurrent infections 03/15/2021   Senile osteoporosis 10/12/2021   Possible sinusitis 01/03/2022   Chronic conjunctivitis of both eyes 04/06/2023   Insomnia 08/02/2023   Resolved Ambulatory Problems    Diagnosis Date Noted   Allergic rhinitis, cause unspecified 08/16/2007   Sinusitis nasal 07/07/2012   Pain in joint, ankle and foot 08/14/2012   Toe contusion 08/14/2012   Hemoptysis 03/23/2013   Bronchitis, chronic obstructive w acute bronchitis (HCC) 03/23/2013   Thrush 04/21/2013   Diarrhea 05/13/2013   Weight loss 12/18/2014   Neck pain 12/26/2014   Abdominal pain 01/16/2013   Difficulty speaking 04/11/2013   Pharyngitis 01/09/2018  Upper respiratory tract infection 01/05/2021   Past Medical History:  Diagnosis Date   Acid reflux    Allergy     Arthritis    Asthma    Interstitial cystitis    Osteoporosis    PONV (postoperative nausea and vomiting)    Tinnitus       Review of Systems  Constitutional:  Negative for chills and fever.  HENT:  Negative for congestion and sore throat.   Respiratory:  Negative for cough and shortness of breath.   Cardiovascular:  Negative for chest pain and leg swelling.  Gastrointestinal:  Negative for constipation, melena, nausea and vomiting.  Genitourinary:  Positive for frequency and urgency.  Skin:  Negative for itching and rash.  Neurological:  Negative for dizziness and headaches.  Psychiatric/Behavioral:  The patient has insomnia.    Negative unless indicated in HPI    Objective:    BP 137/82   Pulse 84   Temp 99.4 F (37.4 C) (Temporal)   Ht 5\' 8"  (1.727 m)   Wt 119 lb 9.6 oz (54.3 kg)   SpO2 95%   BMI 18.19 kg/m  BP Readings from Last 3 Encounters:  08/02/23 137/82  07/27/23 119/73  05/08/23 104/66   Wt Readings from Last 3 Encounters:  08/02/23 119 lb 9.6 oz (54.3 kg)  07/27/23 120 lb (54.4 kg)  05/08/23 117 lb 6.4 oz (53.3 kg)      Physical Exam Vitals and nursing note reviewed.  Constitutional:      General: She is not in acute distress. HENT:     Head: Normocephalic and atraumatic.     Nose: Nose normal.  Eyes:     General: No scleral icterus.    Extraocular Movements: Extraocular movements intact.     Conjunctiva/sclera: Conjunctivae normal.     Pupils: Pupils are equal, round, and reactive to light.  Cardiovascular:     Heart sounds: Normal heart sounds.  Pulmonary:     Effort: Pulmonary effort is normal.     Breath sounds: Normal breath sounds.  Abdominal:     General: Bowel sounds are normal.     Palpations: Abdomen is soft.     Tenderness: There is no abdominal tenderness. There is no right CVA tenderness or left CVA tenderness.  Musculoskeletal:        General: Normal range of motion.     Right lower leg: No edema.     Left lower leg: No edema.  Skin:    General: Skin is warm and dry.     Findings: No rash.  Neurological:     Mental Status: She is alert and  oriented to person, place, and time.  Psychiatric:        Mood and Affect: Mood normal.        Behavior: Behavior normal.        Thought Content: Thought content normal.        Judgment: Judgment normal.     No results found for any visits on 08/02/23.      Assessment & Plan:  Insomnia, unspecified type  Chronic interstitial cystitis  Kristy Garza is a 73 yrs old caucasian female seen for insomnia  Insomnia: Sleep hygiene, no TV before bed, white noise, Continue melatonin 5 mg  and Best Rest  Bladder pain: Follow up with urology as already scheduled - Discuss medication concerns with PCP at her follow-up next week Return for as already scheduled with PC next week.  Kristy Garza [place], [facility] [address] [place], [place] [address] [phone]  Note: This document was prepared by [employer] voice dictation technology and any errors that results from this process are unintentional.

## 2023-08-04 ENCOUNTER — Encounter (HOSPITAL_COMMUNITY): Payer: Self-pay

## 2023-08-07 DIAGNOSIS — N3010 Interstitial cystitis (chronic) without hematuria: Secondary | ICD-10-CM | POA: Diagnosis not present

## 2023-08-09 DIAGNOSIS — N3000 Acute cystitis without hematuria: Secondary | ICD-10-CM | POA: Diagnosis not present

## 2023-08-11 ENCOUNTER — Ambulatory Visit: Admitting: Family Medicine

## 2023-08-11 ENCOUNTER — Ambulatory Visit: Payer: Self-pay | Admitting: Family

## 2023-08-11 ENCOUNTER — Encounter: Payer: Self-pay | Admitting: Family

## 2023-08-11 ENCOUNTER — Ambulatory Visit: Admitting: Family

## 2023-08-11 VITALS — BP 123/75 | HR 77 | Temp 97.9°F | Ht 68.0 in | Wt 117.4 lb

## 2023-08-11 DIAGNOSIS — R682 Dry mouth, unspecified: Secondary | ICD-10-CM

## 2023-08-11 DIAGNOSIS — R300 Dysuria: Secondary | ICD-10-CM

## 2023-08-11 DIAGNOSIS — Z889 Allergy status to unspecified drugs, medicaments and biological substances status: Secondary | ICD-10-CM

## 2023-08-11 DIAGNOSIS — B3731 Acute candidiasis of vulva and vagina: Secondary | ICD-10-CM | POA: Diagnosis not present

## 2023-08-11 DIAGNOSIS — N3010 Interstitial cystitis (chronic) without hematuria: Secondary | ICD-10-CM | POA: Diagnosis not present

## 2023-08-11 LAB — WET PREP FOR TRICH, YEAST, CLUE
Clue Cell Exam: NEGATIVE
Trichomonas Exam: NEGATIVE
Yeast Exam: NEGATIVE

## 2023-08-11 MED ORDER — NYSTATIN 100000 UNIT/GM EX CREA: 1.0000 | TOPICAL_CREAM | Freq: Two times a day (BID) | CUTANEOUS | 2 refills | Status: DC

## 2023-08-11 MED ORDER — FLUCONAZOLE 150 MG PO TABS: 150.0000 mg | ORAL_TABLET | ORAL | 0 refills | Status: DC | PRN

## 2023-08-11 NOTE — Progress Notes (Signed)
 Subjective:    Patient ID: Kristy Garza, female    DOB: 1951-03-22, 73 y.o.   MRN: [id number]  Chief Complaint  Patient presents with   leg cramps    Bilateral that has been ongoing    Pelvic Pain    X 2 weeks    Pt presents to the office today with multiple complaints.   Reports dry mouth  for several months. Report taking several antibiotics over the last two months. She has hx of chronic interstitial cystitis. She is followed by Urologists.   She feels like she is dehydrated and trying to force fluids.  Pelvic Pain The patient's primary symptoms include pelvic pain. Associated symptoms include dysuria. Pertinent negatives include no hematuria or urgency.  Dysuria  This is a new problem. The current episode started 1 to 4 weeks ago. The problem occurs intermittently. The problem has been waxing and waning. The quality of the pain is described as burning. The pain is at a severity of 9/10. The pain is mild. There has been no fever. Pertinent negatives include no hematuria or urgency.      Review of Systems  Genitourinary:  Positive for dysuria and pelvic pain. Negative for hematuria and urgency.  All other systems reviewed and are negative.   Social History   Socioeconomic History   Marital status: Married    Spouse name: Dovie Gell   Number of children: 3   Years of education: 12   Highest education level: Some college, no degree  Occupational History   Occupation: CNA    Comment: Retired  Tobacco Use   Smoking status: Never   Smokeless tobacco: Never  Vaping Use   Vaping status: Never Used  Substance and Sexual Activity   Alcohol use: No    Alcohol/week: 0.0 standard drinks of alcohol    Comment: 01-19-2016 per pt no   Drug use: No    Comment: 01-19-2016 per pt no    Sexual activity: Not Currently    Birth control/protection: Surgical  Other Topics Concern   Not on file  Social History Narrative   Lives with husband.    Social Drivers of Manufacturing engineer Strain: Low Risk  (07/14/2022)   Overall Financial Resource Strain (CARDIA)    Difficulty of Paying Living Expenses: Not hard at all  Food Insecurity: No Food Insecurity (07/14/2022)   Hunger Vital Sign    Worried About Running Out of Food in the Last Year: Never true    Ran Out of Food in the Last Year: Never true  Transportation Needs: No Transportation Needs (07/14/2022)   PRAPARE - Administrator, Civil Service (Medical): No    Lack of Transportation (Non-Medical): No  Physical Activity: Insufficiently Active (07/14/2022)   Exercise Vital Sign    Days of Exercise per Week: 3 days    Minutes of Exercise per Session: 30 min  Stress: No Stress Concern Present (07/14/2022)   [employer] of Occupational Health - Occupational Stress Questionnaire    Feeling of Stress : Not at all  Social Connections: Moderately Integrated (07/14/2022)   Social Connection and Isolation Panel [NHANES]    Frequency of Communication with Friends and Family: More than three times a week    Frequency of Social Gatherings with Friends and Family: More than three times a week    Attends Religious Services: 1 to 4 times per year    Active Member of Clubs or Organizations: No    Attends  Banker Meetings: Never    Marital Status: Married   Family History  Problem Relation Age of Onset   Hyperlipidemia Mother    Arthritis Mother    Cancer Mother    Lymphoma Mother    Cancer Father    Allergies Father    Allergies Sister    Allergies Sister    Allergic rhinitis Neg Hx    Angioedema Neg Hx    Asthma Neg Hx    Eczema Neg Hx    Immunodeficiency Neg Hx    Urticaria Neg Hx         Objective:   Physical Exam Vitals reviewed.  Constitutional:      General: She is not in acute distress.    Appearance: She is well-developed.  HENT:     Head: Normocephalic and atraumatic.  Eyes:     Pupils: Pupils are equal, round, and reactive to light.  Neck:      Thyroid : No thyromegaly.  Cardiovascular:     Rate and Rhythm: Normal rate and regular rhythm.     Heart sounds: Normal heart sounds. No murmur heard. Pulmonary:     Effort: Pulmonary effort is normal. No respiratory distress.     Breath sounds: Normal breath sounds. No wheezing.  Abdominal:     General: Bowel sounds are normal. There is no distension.     Palpations: Abdomen is soft.     Tenderness: There is no abdominal tenderness.  Genitourinary:      Comments: Erythemas  Musculoskeletal:        General: No tenderness. Normal range of motion.     Cervical back: Normal range of motion and neck supple.  Skin:    General: Skin is warm and dry.  Neurological:     Mental Status: She is alert and oriented to person, place, and time.     Cranial Nerves: No cranial nerve deficit.     Deep Tendon Reflexes: Reflexes are normal and symmetric.  Psychiatric:        Behavior: Behavior normal.        Thought Content: Thought content normal.        Judgment: Judgment normal.       BP 123/75   Pulse 77   Temp 97.9 F (36.6 C)   Ht 5\' 8"  (1.727 m)   Wt 117 lb 6.4 oz (53.3 kg)   SpO2 96%   BMI 17.85 kg/m      Assessment & Plan:  Kristy Garza comes in today with chief complaint of leg cramps (Bilateral that has been ongoing ) and Pelvic Pain (X 2 weeks )   Diagnosis and orders addressed:  1. Dysuria (Primary) - WET PREP FOR TRICH, YEAST, CLUE - CMP14+EGFR  2. Chronic interstitial cystitis - CMP14+EGFR  3. Dry mouth - CMP14+EGFR  4. Multiple allergies - CMP14+EGFR  5. Vagina, candidiasis Keep clean and dry Avoid scratching  Stop using clobetasol  cream  Can use Desitin cream - fluconazole  (DIFLUCAN ) 150 MG tablet; Take 1 tablet (150 mg total) by mouth every three (3) days as needed.  Dispense: 3 tablet; Refill: 0 - nystatin  cream (MYCOSTATIN ); Apply 1 Application topically 2 (two) times daily.  Dispense: 90 g; Refill: 2   Labs pending Continue current  medications  Force fluids Keep follow up with PCP    Tommas Fragmin, FNP

## 2023-08-11 NOTE — Patient Instructions (Signed)

## 2023-08-12 LAB — CMP14+EGFR
ALT: 20 IU/L (ref 0–32)
AST: 27 IU/L (ref 0–40)
Albumin: 4.6 g/dL (ref 3.8–4.8)
Alkaline Phosphatase: 45 IU/L (ref 44–121)
BUN/Creatinine Ratio: 14 (ref 12–28)
BUN: 10 mg/dL (ref 8–27)
Bilirubin Total: 0.4 mg/dL (ref 0.0–1.2)
CO2: 21 mmol/L (ref 20–29)
Calcium: 9.5 mg/dL (ref 8.7–10.3)
Chloride: 95 mmol/L — ABNORMAL LOW (ref 96–106)
Creatinine, Ser: 0.73 mg/dL (ref 0.57–1.00)
Globulin, Total: 2.1 g/dL (ref 1.5–4.5)
Glucose: 87 mg/dL (ref 70–99)
Potassium: 4.2 mmol/L (ref 3.5–5.2)
Sodium: 133 mmol/L — ABNORMAL LOW (ref 134–144)
Total Protein: 6.7 g/dL (ref 6.0–8.5)
eGFR: 87 mL/min/{1.73_m2} (ref >59)

## 2023-08-14 ENCOUNTER — Telehealth: Payer: Self-pay

## 2023-08-14 NOTE — Telephone Encounter (Signed)
 fyi

## 2023-08-17 DIAGNOSIS — M9903 Segmental and somatic dysfunction of lumbar region: Secondary | ICD-10-CM | POA: Diagnosis not present

## 2023-08-17 DIAGNOSIS — M9902 Segmental and somatic dysfunction of thoracic region: Secondary | ICD-10-CM | POA: Diagnosis not present

## 2023-08-17 DIAGNOSIS — M9901 Segmental and somatic dysfunction of cervical region: Secondary | ICD-10-CM | POA: Diagnosis not present

## 2023-08-17 DIAGNOSIS — M62830 Muscle spasm of back: Secondary | ICD-10-CM | POA: Diagnosis not present

## 2023-08-27 DIAGNOSIS — R140 Abdominal distension (gaseous): Secondary | ICD-10-CM | POA: Diagnosis not present

## 2023-08-27 DIAGNOSIS — K5900 Constipation, unspecified: Secondary | ICD-10-CM | POA: Diagnosis not present

## 2023-08-27 DIAGNOSIS — R519 Headache, unspecified: Secondary | ICD-10-CM | POA: Diagnosis not present

## 2023-08-27 DIAGNOSIS — R10819 Abdominal tenderness, unspecified site: Secondary | ICD-10-CM | POA: Diagnosis not present

## 2023-08-27 DIAGNOSIS — R110 Nausea: Secondary | ICD-10-CM | POA: Diagnosis not present

## 2023-08-27 DIAGNOSIS — K219 Gastro-esophageal reflux disease without esophagitis: Secondary | ICD-10-CM | POA: Diagnosis not present

## 2023-08-29 ENCOUNTER — Encounter: Payer: Self-pay | Admitting: Family Medicine

## 2023-08-29 ENCOUNTER — Ambulatory Visit: Admitting: Family Medicine

## 2023-08-29 VITALS — BP 105/68 | HR 67 | Temp 97.9°F | Ht 68.0 in | Wt 113.6 lb

## 2023-08-29 DIAGNOSIS — Z79899 Other long term (current) drug therapy: Secondary | ICD-10-CM | POA: Diagnosis not present

## 2023-08-29 DIAGNOSIS — R6884 Jaw pain: Secondary | ICD-10-CM

## 2023-08-29 DIAGNOSIS — Z13220 Encounter for screening for lipoid disorders: Secondary | ICD-10-CM

## 2023-08-29 DIAGNOSIS — G8929 Other chronic pain: Secondary | ICD-10-CM

## 2023-08-29 DIAGNOSIS — G894 Chronic pain syndrome: Secondary | ICD-10-CM

## 2023-08-29 DIAGNOSIS — R252 Cramp and spasm: Secondary | ICD-10-CM | POA: Diagnosis not present

## 2023-08-29 DIAGNOSIS — K5903 Drug induced constipation: Secondary | ICD-10-CM | POA: Diagnosis not present

## 2023-08-29 DIAGNOSIS — K219 Gastro-esophageal reflux disease without esophagitis: Secondary | ICD-10-CM

## 2023-08-29 DIAGNOSIS — K5909 Other constipation: Secondary | ICD-10-CM

## 2023-08-29 DIAGNOSIS — G4763 Sleep related bruxism: Secondary | ICD-10-CM | POA: Diagnosis not present

## 2023-08-29 MED ORDER — FAMOTIDINE 20 MG PO TABS: 20.0000 mg | ORAL_TABLET | Freq: Two times a day (BID) | ORAL | 1 refills | Status: AC | PRN

## 2023-08-29 MED ORDER — NALOXEGOL OXALATE 25 MG PO TABS: 25.0000 mg | ORAL_TABLET | Freq: Every day | ORAL | 3 refills | Status: DC

## 2023-08-29 MED ORDER — TRAMADOL HCL 50 MG PO TABS: 50.0000 mg | ORAL_TABLET | Freq: Two times a day (BID) | ORAL | 1 refills | Status: AC | PRN

## 2023-08-29 MED ORDER — TIZANIDINE HCL 2 MG PO CAPS: 2.0000 mg | ORAL_CAPSULE | Freq: Three times a day (TID) | ORAL | 2 refills | Status: AC | PRN

## 2023-08-29 NOTE — Patient Instructions (Signed)
 Thyroid  just checked in October and was normal.

## 2023-08-29 NOTE — Progress Notes (Signed)
 Subjective: CC: Multiple concerns PCP: Eliodoro Guerin, DO Kristy Garza is a 73 y.o. female presenting to clinic today for:  1.  Constipation This is been a chronic issue for her but it compounded by use of tramadol .  She notes that symptoms have been refractory to OTC management including MiraLAX , Benefiber, Senokot and Linzess .  Willing to try something else.  Has seen gastroenterology for this and is currently under their care  2.  Leg cramps She reports that she gets cramps in the legs at nighttime.  She is very restricted in her eating due to multiple specialists in the naturopathic realm telling her that she could not eat certain things.  She has been trying to hydrate more and that has helped slightly but sometimes she notes that the cramping is so bad her toes will curl.  This is relieved by pickle juice and/or mustard sometimes.  3.  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  Needs refill on her Zanaflex  and tramadol .  Denies any excessive daytime sedation or falls with this but does report constipation as above.   ROS: Per HPI  Allergies  Allergen Reactions   Cefuroxime  Axetil Other (See Comments)    Extreme gas   Gabapentin  Other (See Comments)    Constipation Constipation   Montelukast  Other (See Comments)    Numbness and tingling in hand. Numbness and tingling in hand. Numbness and tingling in hand. NUMBNESS OF HANDS AND FEET. Numbness in hands and feet NUMBNESS OF HANDS AND FEET.   Pregabalin Other (See Comments)    Drowsiness, dry mouth  Makes her tired and thirsty   Augmentin  [Amoxicillin -Pot Clavulanate] Diarrhea   Avelox [Moxifloxacin Hcl In Nacl] Other (See Comments)    Tremors    Biaxin  [Clarithromycin ]     Unsure of reaction   Cedax [Ceftibuten] Other (See Comments)    tremors   Ciprofloxacin  Other (See Comments) and Nausea And Vomiting    Blurred vision Blurred vision Pt can't remember reaction    Clindamycin/Lincomycin     tachycardia   Doxycycline  Diarrhea    Nausea , stomach upset   Levofloxacin Other (See Comments)    Feels dehydrated   Lincomycin Other (See Comments)    tachycardia   Montelukast  Sodium Other (See Comments)    Numbness and tingling in hand. Numbness and tingling in hand.   Sulfonamide Derivatives Other (See Comments)    Gi upset   Sulfa Antibiotics Nausea Only and Other (See Comments)    Other reaction(s): Other (See Comments)  Gi upset  Other reaction(s): Unknown   Past Medical History:  Diagnosis Date   Abdominal pain 01/16/2013   Acid reflux    Allergy     Arthritis    ARTHRITIS IN NECK BY DR. Brenita Callow ISS[facility]   Asthma    Interstitial cystitis    Osteoporosis    PONV (postoperative nausea and vomiting)    Tinnitus    Trigeminal neuralgia    Atypical trigeminal neuralgia    Current Outpatient Medications:    acetaminophen  (TYLENOL ) 500 MG tablet, Take by mouth., Disp: , Rfl:    albuterol  (VENTOLIN  HFA) 108 (90 Base) MCG/ACT inhaler, TAKE 2 PUFFS BY MOUTH EVERY 6 HOURS AS NEEDED FOR WHEEZE OR SHORTNESS OF BREATH, Disp: 18 each, Rfl: 0   ARNUITY ELLIPTA  200 MCG/ACT AEPB, TAKE 1 PUFF BY MOUTH EVERY DAY, Disp: 30 each, Rfl: 5   ascorbic acid (VITAMIN C) 1000 MG tablet, Take by mouth., Disp: ,  Rfl:    aspirin-acetaminophen -caffeine  (EXCEDRIN MIGRAINE) 250-250-65 MG tablet, Take by mouth., Disp: , Rfl:    azelastine  (ASTELIN ) 0.1 % nasal spray, Place 2 sprays into both nostrils 2 (two) times daily. Use in each nostril as directed, Disp: 30 mL, Rfl: 12   b complex vitamins capsule, Take 1 capsule by mouth daily., Disp: , Rfl:    butalbital -acetaminophen -caffeine  (FIORICET) 50-325-40 MG tablet, Take 1 tablet by mouth every 6 (six) hours as needed for headache., Disp: 14 tablet, Rfl: 0   calcium  citrate-vitamin D  (CITRACAL+D) 315-200 MG-UNIT tablet, Take by mouth., Disp: , Rfl:    Cholecalciferol (VITAMIN D3) 125 MCG (5000 UT) CAPS, Take 5,000 Units by  mouth daily., Disp: , Rfl:    clobetasol  cream (TEMOVATE ) 0.05 %, Apply vaginally 3 days per week IF the estrace  is not helpful., Disp: 30 g, Rfl: 0   estradiol  (ESTRACE  VAGINAL) 0.1 MG/GM vaginal cream, Apply 0.5 grams vaginally 3 days per week., Disp: 42.5 g, Rfl: 12   famotidine  (PEPCID ) 20 MG tablet, Take 20 mg by mouth 2 (two) times daily., Disp: , Rfl:    ferrous sulfate  (FE TABS) 325 (65 FE) MG EC tablet, Take 1 tablet (325 mg total) by mouth daily with breakfast., Disp: 90 tablet, Rfl: 1   fluconazole  (DIFLUCAN ) 150 MG tablet, Take 1 tablet (150 mg total) by mouth every three (3) days as needed., Disp: 3 tablet, Rfl: 0   hydrOXYzine  (ATARAX ) 25 MG tablet, Take 25 mg by mouth at bedtime., Disp: , Rfl:    hyoscyamine (LEVSIN SL) 0.125 MG SL tablet, Place under the tongue every 4 (four) hours as needed., Disp: , Rfl:    lidocaine  (XYLOCAINE ) 2 % jelly, APPLY TOPICALLY TO AFFECTED AREA EVERY DAY AS NEEDED (use sparingly), Disp: 85 g, Rfl: 0   Lidocaine -Collagen-Aloe Vera (REGENECARE) 2 % GEL, Apply topically daily., Disp: 85 g, Rfl: 2   melatonin 5 MG TABS, Take 5 mg by mouth., Disp: , Rfl:    Meth-Hyo-M Bl-Na Phos-Ph Sal (URO-MP) 118 MG CAPS, Take 1 capsule by mouth 4 (four) times daily as needed., Disp: , Rfl:    mometasone  (NASONEX ) 50 MCG/ACT nasal spray, Place 2 sprays into the nose daily., Disp: 17 g, Rfl: 12   NALTREXONE HCL PO, Take by mouth., Disp: , Rfl:    nitrofurantoin , macrocrystal-monohydrate, (MACROBID ) 100 MG capsule, Take 1 capsule (100 mg total) by mouth 2 (two) times daily., Disp: 14 capsule, Rfl: 0   nystatin  cream (MYCOSTATIN ), Apply 1 Application topically 2 (two) times daily., Disp: 90 g, Rfl: 2   olopatadine  (PATANOL) 0.1 % ophthalmic solution, Place 1 drop into both eyes 2 (two) times daily as needed for allergies., Disp: 5 mL, Rfl: 5   Olopatadine -Mometasone  (RYALTRIS ) 665-25 MCG/ACT SUSP, Place 1-2 sprays into the nose in the morning and at bedtime., Disp: 29 g, Rfl:  5   ondansetron  (ZOFRAN ) 4 MG tablet, Take 1 tablet (4 mg total) by mouth 3 (three) times daily as needed for nausea or vomiting., Disp: 20 tablet, Rfl: 1   phenazopyridine  (PYRIDIUM ) 200 MG tablet, Take 1 tablet (200 mg total) by mouth 4 (four) times daily as needed for pain (urinary frequency &/or pain)., Disp: 12 tablet, Rfl: 0   Polyethylene Glycol 3350  (MIRALAX  PO), Take by mouth as needed., Disp: , Rfl:    praziquantel (BILTRICIDE) 600 MG tablet, Take 600 mg by mouth 3 (three) times daily., Disp: , Rfl:    predniSONE  (DELTASONE ) 10 MG tablet, Take 1 tablet (10  mg total) by mouth daily with breakfast., Disp: 9 tablet, Rfl: 0   Probiotic Product (PROBIOTIC PO), Take by mouth daily., Disp: , Rfl:    sennosides-docusate sodium  (SENOKOT-S) 8.6-50 MG tablet, Take 1 tablet by mouth daily., Disp: 30 tablet, Rfl: 0   Simethicone (GAS-X PO), Take by mouth., Disp: , Rfl:    tizanidine  (ZANAFLEX ) 2 MG capsule, Take 1-2 capsules (2-4 mg total) by mouth 3 (three) times daily as needed for muscle spasms., Disp: 60 capsule, Rfl: 2   traMADol  (ULTRAM ) 50 MG tablet, Take 1 tablet (50 mg total) by mouth every 12 (twelve) hours as needed., Disp: 30 tablet, Rfl: 1   UNABLE TO FIND, Take 1 capsule by mouth daily. Dv3- vitamin D  3 plus immune support, Disp: , Rfl:  Social History   Socioeconomic History   Marital status: Married    Spouse name: Dovie Gell   Number of children: 3   Years of education: 12   Highest education level: Some college, no degree  Occupational History   Occupation: CNA    Comment: Retired  Tobacco Use   Smoking status: Never   Smokeless tobacco: Never  Vaping Use   Vaping status: Never Used  Substance and Sexual Activity   Alcohol use: No    Alcohol/week: 0.0 standard drinks of alcohol    Comment: 01-19-2016 per pt no   Drug use: No    Comment: 01-19-2016 per pt no    Sexual activity: Not Currently    Birth control/protection: Surgical  Other Topics Concern   Not on file   Social History Narrative   Lives with husband.    Social Drivers of Corporate investment banker Strain: Low Risk  (07/14/2022)   Overall Financial Resource Strain (CARDIA)    Difficulty of Paying Living Expenses: Not hard at all  Food Insecurity: No Food Insecurity (07/14/2022)   Hunger Vital Sign    Worried About Running Out of Food in the Last Year: Never true    Ran Out of Food in the Last Year: Never true  Transportation Needs: No Transportation Needs (07/14/2022)   PRAPARE - Administrator, Civil Service (Medical): No    Lack of Transportation (Non-Medical): No  Physical Activity: Insufficiently Active (07/14/2022)   Exercise Vital Sign    Days of Exercise per Week: 3 days    Minutes of Exercise per Session: 30 min  Stress: No Stress Concern Present (07/14/2022)   [employer] of Occupational Health - Occupational Stress Questionnaire    Feeling of Stress : Not at all  Social Connections: Moderately Integrated (07/14/2022)   Social Connection and Isolation Panel [NHANES]    Frequency of Communication with Friends and Family: More than three times a week    Frequency of Social Gatherings with Friends and Family: More than three times a week    Attends Religious Services: 1 to 4 times per year    Active Member of [employer] or Organizations: No    Attends Banker Meetings: Never    Marital Status: Married  Catering manager Violence: Not At Risk (05/11/2023)   Received from [facility]   HITS    Over the last 12 months how often did your partner physically hurt you?: Never    Over the last 12 months how often did your partner insult you or talk down to you?: Never    Over the last 12 months how often did your partner threaten you with physical harm?: Never  Over the last 12 months how often did your partner scream or curse at you?: Never   Family History  Problem Relation Age of Onset   Hyperlipidemia Mother    Arthritis Mother    Cancer Mother     Lymphoma Mother    Cancer Father    Allergies Father    Allergies Sister    Allergies Sister    Allergic rhinitis Neg Hx    Angioedema Neg Hx    Asthma Neg Hx    Eczema Neg Hx    Immunodeficiency Neg Hx    Urticaria Neg Hx     Objective: Office vital signs reviewed. BP 105/68   Pulse 67   Temp 97.9 F (36.6 C)   Ht 5\' 8"  (1.727 m)   Wt 113 lb 9.6 oz (51.5 kg)   SpO2 99%   BMI 17.27 kg/m   Physical Examination:  General: Awake, alert, malnourished, No acute distress HEENT: Sclera white.  Moist mucous membranes Cardio: regular rate and rhythm, S1S2 heard, no murmurs appreciated Pulm: clear to auscultation bilaterally, no wheezes, rhonchi or rales; normal work of breathing on room air GI: Stomach is flat, nontender nondistended MSK: Ambulating independently with normal gait and station.     08/29/2023    8:54 AM 07/27/2023   11:14 AM 05/08/2023    8:21 AM  Depression screen PHQ 2/9  Decreased Interest 0 0 0  Down, Depressed, Hopeless 0 0 0  PHQ - 2 Score 0 0 0  Altered sleeping 0 1 0  Tired, decreased energy 0 1 0  Change in appetite 0 0 0  Feeling bad or failure about yourself  0 0 0  Trouble concentrating 0 0 0  Moving slowly or fidgety/restless 0 0 0  Suicidal thoughts 0 0 0  PHQ-9 Score 0 2 0  Difficult doing work/chores Not difficult at all Not difficult at all Not difficult at all      08/29/2023    8:54 AM 07/27/2023   11:14 AM 05/08/2023    8:22 AM 03/16/2023    2:59 PM  GAD 7 : Generalized Anxiety Score  Nervous, Anxious, on Edge 0 0 0 0  Control/stop worrying 0 0 0 0  Worry too much - different things 0 0 0 0  Trouble relaxing 0 0 0 0  Restless 0 0 0 0  Easily annoyed or irritable 0 0 0 0  Afraid - awful might happen 0 0 0 0  Total GAD 7 Score 0 0 0 0  Anxiety Difficulty Not difficult at all Not difficult at all Not difficult at all Not difficult at all      Assessment/ Plan: 73 y.o. female   Chronic constipation - Plan: naloxegol oxalate  (MOVANTIK) 25 MG TABS tablet  Opioid-induced constipation - Plan: naloxegol oxalate (MOVANTIK) 25 MG TABS tablet  Bruxism, sleep-related - Plan: tizanidine  (ZANAFLEX ) 2 MG capsule  Chronic pain syndrome - Plan: traMADol  (ULTRAM ) 50 MG tablet  Chronic jaw pain - Plan: traMADol  (ULTRAM ) 50 MG tablet  Leg cramping - Plan: Iron, TIBC and Ferritin Panel, CMP14+EGFR, CBC, Magnesium  Controlled substance agreement signed - Plan: ToxASSURE Select 13 (MW), Urine  Gastroesophageal reflux disease without esophagitis - Plan: famotidine  (PEPCID ) 20 MG tablet  Screening cholesterol level - Plan: Lipid Panel  I will try and get her on Movantik.  She has failed Linzess , Benefiber, MiraLAX  and OTC stool softeners etc.  Trulance  is not covered by her insurance  I have renewed her  tramadol , Zanaflex .  We updated her UDS and CSA today as per office policy and the national narcotic database was reviewed with no red flags  For her leg cramping, we will look for metabolic etiology but I suspect this is related to restrictive dieting, inadequate hydration.  We could consider something like a restless leg medication and she was amenable to this if all labs come back normal  We did not discuss GERD at detail but needed refill on famotidine  so this was sent  She also requested screening lipid level and this was collected today   Eliodoro Guerin, DO [facility] [phone]

## 2023-08-30 ENCOUNTER — Ambulatory Visit: Payer: Self-pay | Admitting: Family Medicine

## 2023-08-30 DIAGNOSIS — E7800 Pure hypercholesterolemia, unspecified: Secondary | ICD-10-CM

## 2023-08-30 DIAGNOSIS — G2581 Restless legs syndrome: Secondary | ICD-10-CM

## 2023-08-30 LAB — CBC
Hematocrit: 31.4 % — ABNORMAL LOW (ref 34.0–46.6)
Hemoglobin: 10.5 g/dL — ABNORMAL LOW (ref 11.1–15.9)
MCH: 31.4 pg (ref 26.6–33.0)
MCHC: 33.4 g/dL (ref 31.5–35.7)
MCV: 94 fL (ref 79–97)
Platelets: 203 10*3/uL (ref 150–450)
RBC: 3.34 x10E6/uL — ABNORMAL LOW (ref 3.77–5.28)
RDW: 12.3 % (ref 11.7–15.4)
WBC: 3.5 10*3/uL (ref 3.4–10.8)

## 2023-08-30 LAB — LIPID PANEL
Chol/HDL Ratio: 2.8 ratio (ref 0.0–4.4)
Cholesterol, Total: 184 mg/dL (ref 100–199)
HDL: 66 mg/dL (ref >39)
LDL Chol Calc (NIH): 109 mg/dL — ABNORMAL HIGH (ref 0–99)
Triglycerides: 47 mg/dL (ref 0–149)
VLDL Cholesterol Cal: 9 mg/dL (ref 5–40)

## 2023-08-30 LAB — CMP14+EGFR
ALT: 25 IU/L (ref 0–32)
AST: 43 IU/L — ABNORMAL HIGH (ref 0–40)
Albumin: 4.5 g/dL (ref 3.8–4.8)
Alkaline Phosphatase: 42 IU/L — ABNORMAL LOW (ref 44–121)
BUN/Creatinine Ratio: 12 (ref 12–28)
BUN: 10 mg/dL (ref 8–27)
Bilirubin Total: 0.5 mg/dL (ref 0.0–1.2)
CO2: 22 mmol/L (ref 20–29)
Calcium: 9.5 mg/dL (ref 8.7–10.3)
Chloride: 99 mmol/L (ref 96–106)
Creatinine, Ser: 0.86 mg/dL (ref 0.57–1.00)
Globulin, Total: 2 g/dL (ref 1.5–4.5)
Glucose: 80 mg/dL (ref 70–99)
Potassium: 4.3 mmol/L (ref 3.5–5.2)
Sodium: 135 mmol/L (ref 134–144)
Total Protein: 6.5 g/dL (ref 6.0–8.5)
eGFR: 72 mL/min/{1.73_m2} (ref >59)

## 2023-08-30 LAB — IRON,TIBC AND FERRITIN PANEL
Ferritin: 137 ng/mL (ref 15–150)
Iron Saturation: 17 % (ref 15–55)
Iron: 46 ug/dL (ref 27–139)
Total Iron Binding Capacity: 268 ug/dL (ref 250–450)
UIBC: 222 ug/dL (ref 118–369)

## 2023-08-30 LAB — MAGNESIUM: Magnesium: 2.2 mg/dL (ref 1.6–2.3)

## 2023-08-30 MED ORDER — ROSUVASTATIN CALCIUM 5 MG PO TABS
5.0000 mg | ORAL_TABLET | Freq: Every day | ORAL | 3 refills | Status: DC
Start: 2023-08-30 — End: 2023-11-08

## 2023-08-30 MED ORDER — ROPINIROLE HCL 0.25 MG PO TABS: 0.2500 mg | ORAL_TABLET | Freq: Every day | ORAL | 1 refills | Status: DC

## 2023-08-31 DIAGNOSIS — D649 Anemia, unspecified: Secondary | ICD-10-CM | POA: Diagnosis not present

## 2023-08-31 DIAGNOSIS — K581 Irritable bowel syndrome with constipation: Secondary | ICD-10-CM | POA: Diagnosis not present

## 2023-08-31 LAB — TOXASSURE SELECT 13 (MW), URINE

## 2023-09-06 DIAGNOSIS — M9901 Segmental and somatic dysfunction of cervical region: Secondary | ICD-10-CM | POA: Diagnosis not present

## 2023-09-06 DIAGNOSIS — M62830 Muscle spasm of back: Secondary | ICD-10-CM | POA: Diagnosis not present

## 2023-09-06 DIAGNOSIS — M9902 Segmental and somatic dysfunction of thoracic region: Secondary | ICD-10-CM | POA: Diagnosis not present

## 2023-09-06 DIAGNOSIS — M9903 Segmental and somatic dysfunction of lumbar region: Secondary | ICD-10-CM | POA: Diagnosis not present

## 2023-09-08 DIAGNOSIS — K5904 Chronic idiopathic constipation: Secondary | ICD-10-CM | POA: Diagnosis not present

## 2023-09-08 DIAGNOSIS — K581 Irritable bowel syndrome with constipation: Secondary | ICD-10-CM | POA: Diagnosis not present

## 2023-09-08 DIAGNOSIS — K219 Gastro-esophageal reflux disease without esophagitis: Secondary | ICD-10-CM | POA: Diagnosis not present

## 2023-09-08 DIAGNOSIS — R5383 Other fatigue: Secondary | ICD-10-CM | POA: Diagnosis not present

## 2023-09-08 DIAGNOSIS — D649 Anemia, unspecified: Secondary | ICD-10-CM | POA: Diagnosis not present

## 2023-09-08 DIAGNOSIS — A071 Giardiasis [lambliasis]: Secondary | ICD-10-CM | POA: Diagnosis not present

## 2023-09-08 DIAGNOSIS — B700 Diphyllobothriasis: Secondary | ICD-10-CM | POA: Diagnosis not present

## 2023-09-08 DIAGNOSIS — R194 Change in bowel habit: Secondary | ICD-10-CM | POA: Diagnosis not present

## 2023-09-08 DIAGNOSIS — K5900 Constipation, unspecified: Secondary | ICD-10-CM | POA: Diagnosis not present

## 2023-09-08 DIAGNOSIS — K921 Melena: Secondary | ICD-10-CM | POA: Diagnosis not present

## 2023-09-14 DIAGNOSIS — M9902 Segmental and somatic dysfunction of thoracic region: Secondary | ICD-10-CM | POA: Diagnosis not present

## 2023-09-14 DIAGNOSIS — M62830 Muscle spasm of back: Secondary | ICD-10-CM | POA: Diagnosis not present

## 2023-09-14 DIAGNOSIS — M9903 Segmental and somatic dysfunction of lumbar region: Secondary | ICD-10-CM | POA: Diagnosis not present

## 2023-09-14 DIAGNOSIS — M9901 Segmental and somatic dysfunction of cervical region: Secondary | ICD-10-CM | POA: Diagnosis not present

## 2023-09-21 DIAGNOSIS — M9902 Segmental and somatic dysfunction of thoracic region: Secondary | ICD-10-CM | POA: Diagnosis not present

## 2023-09-21 DIAGNOSIS — M9901 Segmental and somatic dysfunction of cervical region: Secondary | ICD-10-CM | POA: Diagnosis not present

## 2023-09-21 DIAGNOSIS — M9903 Segmental and somatic dysfunction of lumbar region: Secondary | ICD-10-CM | POA: Diagnosis not present

## 2023-09-21 DIAGNOSIS — M62830 Muscle spasm of back: Secondary | ICD-10-CM | POA: Diagnosis not present

## 2023-10-02 ENCOUNTER — Ambulatory Visit: Admitting: Nurse Practitioner

## 2023-10-02 DIAGNOSIS — N3020 Other chronic cystitis without hematuria: Secondary | ICD-10-CM | POA: Diagnosis not present

## 2023-10-03 DIAGNOSIS — M9902 Segmental and somatic dysfunction of thoracic region: Secondary | ICD-10-CM | POA: Diagnosis not present

## 2023-10-03 DIAGNOSIS — M9901 Segmental and somatic dysfunction of cervical region: Secondary | ICD-10-CM | POA: Diagnosis not present

## 2023-10-03 DIAGNOSIS — M9903 Segmental and somatic dysfunction of lumbar region: Secondary | ICD-10-CM | POA: Diagnosis not present

## 2023-10-03 DIAGNOSIS — M62830 Muscle spasm of back: Secondary | ICD-10-CM | POA: Diagnosis not present

## 2023-10-06 ENCOUNTER — Ambulatory Visit: Payer: Self-pay

## 2023-10-06 NOTE — Telephone Encounter (Signed)
 noted

## 2023-10-06 NOTE — Telephone Encounter (Signed)
 FYI Only or Action Required?: Action required by provider: patient initially called with symptoms but states quickly into the call that she would be calling her urologist and hangs up the phone.  Patient was last seen in primary care on 08/29/2023 by Jolinda Norene HERO, DO.  Called Nurse Triage reporting Advice Only.  Symptoms began unknown.  Interventions attempted: Nothing.  Symptoms are: stable.  Triage Disposition: Information or Advice Only Call-unable to start triage-patient stated she would speak with urology office and hung up.   Patient/caregiver understands and will follow disposition?: Yes  Copied from CRM [id number]. Topic: Clinical - Red Word Triage >> Oct 06, 2023  2:41 PM Tobias L wrote: Red Word that prompted transfer to Nurse Triage: possible UTI, having a lot of pain and inflammation Reason for Disposition  Health information question, no triage required and triager able to answer question  Answer Assessment - Initial Assessment Questions 1. REASON FOR CALL: What is the main reason for your call? or How can I best help you?     Patient called due to pain and inflammation with urination. Attempted to ask questions but patient states she will call her urologist back and hangs up the phone 2. SYMPTOMS : Do you have any symptoms?      Pain and inflammation with urination 3. OTHER QUESTIONS: Do you have any other questions?     Patient hung up the phone  Protocols used: Information Only Call - No Triage-A-AH

## 2023-10-09 DIAGNOSIS — N390 Urinary tract infection, site not specified: Secondary | ICD-10-CM | POA: Diagnosis not present

## 2023-10-18 ENCOUNTER — Ambulatory Visit: Payer: Self-pay | Admitting: Family Medicine

## 2023-10-18 ENCOUNTER — Encounter: Payer: Self-pay | Admitting: Family Medicine

## 2023-10-18 ENCOUNTER — Ambulatory Visit (INDEPENDENT_AMBULATORY_CARE_PROVIDER_SITE_OTHER): Admitting: Family Medicine

## 2023-10-18 VITALS — BP 107/65 | HR 87 | Temp 98.7°F | Ht 68.0 in | Wt 115.0 lb

## 2023-10-18 DIAGNOSIS — Z87440 Personal history of urinary (tract) infections: Secondary | ICD-10-CM

## 2023-10-18 DIAGNOSIS — R21 Rash and other nonspecific skin eruption: Secondary | ICD-10-CM

## 2023-10-18 DIAGNOSIS — N760 Acute vaginitis: Secondary | ICD-10-CM | POA: Diagnosis not present

## 2023-10-18 LAB — URINALYSIS, ROUTINE W REFLEX MICROSCOPIC
Bilirubin, UA: NEGATIVE
Glucose, UA: NEGATIVE
Ketones, UA: NEGATIVE
Leukocytes,UA: NEGATIVE
Nitrite, UA: NEGATIVE
Protein,UA: NEGATIVE
RBC, UA: NEGATIVE
Specific Gravity, UA: 1.01 (ref 1.005–1.030)
Urobilinogen, Ur: 0.2 mg/dL (ref 0.2–1.0)
pH, UA: 7 (ref 5.0–7.5)

## 2023-10-18 MED ORDER — FLUCONAZOLE 150 MG PO TABS: 150.0000 mg | ORAL_TABLET | Freq: Once | ORAL | 0 refills | Status: AC

## 2023-10-18 MED ORDER — TRIAMCINOLONE ACETONIDE 0.1 % EX CREA: TOPICAL_CREAM | CUTANEOUS | 0 refills | Status: DC

## 2023-10-18 NOTE — Progress Notes (Signed)
 Subjective: CC: Rash PCP: Jolinda Norene HERO, DO YEP:Kristy Garza is a 73 y.o. female presenting to clinic today for:  1.  Rash She reports that she has had a rash on her ankles and on the inner thighs since she got back from the beach.  Her husband has a similar rash.  She reports it is diffusely itchy which is refractory to OTC cortisone cream.   2.  Vaginal irritation Patient reports recent treatment for urinary tract infection with fosfomycin.  She would like to get repeat UA today.  She has been utilizing topical vaginocele for the itching and irritation.  However, symptoms have not resolved   ROS: Per HPI  Allergies  Allergen Reactions   Cefuroxime  Axetil Other (See Comments)    Extreme gas   Gabapentin  Other (See Comments)    Constipation Constipation   Montelukast  Other (See Comments)    Numbness and tingling in hand. Numbness and tingling in hand. Numbness and tingling in hand. NUMBNESS OF HANDS AND FEET. Numbness in hands and feet NUMBNESS OF HANDS AND FEET.   Pregabalin Other (See Comments)    Drowsiness, dry mouth  Makes her tired and thirsty   Augmentin  [Amoxicillin -Pot Clavulanate] Diarrhea   Avelox [Moxifloxacin Hcl In Nacl] Other (See Comments)    Tremors    Biaxin  [Clarithromycin ]     Unsure of reaction   Cedax [Ceftibuten] Other (See Comments)    tremors   Ciprofloxacin  Other (See Comments) and Nausea And Vomiting    Blurred vision Blurred vision Pt can't remember reaction   Clindamycin/Lincomycin     tachycardia   Doxycycline  Diarrhea    Nausea , stomach upset   Levofloxacin Other (See Comments)    Feels dehydrated   Lincomycin Other (See Comments)    tachycardia   Montelukast  Sodium Other (See Comments)    Numbness and tingling in hand. Numbness and tingling in hand.   Sulfonamide Derivatives Other (See Comments)    Gi upset   Sulfa Antibiotics Nausea Only and Other (See Comments)    Other reaction(s): Other (See Comments)  Gi  upset  Other reaction(s): Unknown   Past Medical History:  Diagnosis Date   Abdominal pain 01/16/2013   Acid reflux    Allergy     Arthritis    ARTHRITIS IN NECK BY DR. MERILEE ISSAC   Asthma    Interstitial cystitis    Osteoporosis    PONV (postoperative nausea and vomiting)    Tinnitus    Trigeminal neuralgia    Atypical trigeminal neuralgia    Current Outpatient Medications:    acetaminophen  (TYLENOL ) 500 MG tablet, Take by mouth., Disp: , Rfl:    albuterol  (VENTOLIN  HFA) 108 (90 Base) MCG/ACT inhaler, TAKE 2 PUFFS BY MOUTH EVERY 6 HOURS AS NEEDED FOR WHEEZE OR SHORTNESS OF BREATH, Disp: 18 each, Rfl: 0   ARNUITY ELLIPTA  200 MCG/ACT AEPB, TAKE 1 PUFF BY MOUTH EVERY DAY, Disp: 30 each, Rfl: 5   ascorbic acid (VITAMIN C) 1000 MG tablet, Take by mouth., Disp: , Rfl:    aspirin-acetaminophen -caffeine  (EXCEDRIN MIGRAINE) 250-250-65 MG tablet, Take by mouth., Disp: , Rfl:    azelastine  (ASTELIN ) 0.1 % nasal spray, Place 2 sprays into both nostrils 2 (two) times daily. Use in each nostril as directed, Disp: 30 mL, Rfl: 12   b complex vitamins capsule, Take 1 capsule by mouth daily., Disp: , Rfl:    Cholecalciferol (VITAMIN D3) 125 MCG (5000 UT) CAPS, Take 5,000 Units by mouth daily., Disp: ,  Rfl:    estradiol  (ESTRACE  VAGINAL) 0.1 MG/GM vaginal cream, Apply 0.5 grams vaginally 3 days per week., Disp: 42.5 g, Rfl: 12   famotidine  (PEPCID ) 20 MG tablet, Take 1 tablet (20 mg total) by mouth 2 (two) times daily as needed for heartburn or indigestion., Disp: 180 tablet, Rfl: 1   hyoscyamine (LEVSIN SL) 0.125 MG SL tablet, Place under the tongue every 4 (four) hours as needed., Disp: , Rfl:    Lidocaine -Collagen-Aloe Vera (REGENECARE) 2 % GEL, Apply topically daily., Disp: 85 g, Rfl: 2   melatonin 5 MG TABS, Take 5 mg by mouth., Disp: , Rfl:    mometasone  (NASONEX ) 50 MCG/ACT nasal spray, Place 2 sprays into the nose daily., Disp: 17 g, Rfl: 12   naloxegol  oxalate (MOVANTIK ) 25 MG TABS  tablet, Take 1 tablet (25 mg total) by mouth daily., Disp: 90 tablet, Rfl: 3   NALTREXONE HCL PO, Take by mouth., Disp: , Rfl:    olopatadine  (PATANOL) 0.1 % ophthalmic solution, Place 1 drop into both eyes 2 (two) times daily as needed for allergies., Disp: 5 mL, Rfl: 5   ondansetron  (ZOFRAN ) 4 MG tablet, Take 1 tablet (4 mg total) by mouth 3 (three) times daily as needed for nausea or vomiting., Disp: 20 tablet, Rfl: 1   Polyethylene Glycol 3350  (MIRALAX  PO), Take by mouth as needed., Disp: , Rfl:    Probiotic Product (PROBIOTIC PO), Take by mouth daily., Disp: , Rfl:    rOPINIRole  (REQUIP ) 0.25 MG tablet, Take 1 tablet (0.25 mg total) by mouth at bedtime. May increase to 2 tablets at bedtime after 2 weeks., Disp: 180 tablet, Rfl: 1   rosuvastatin  (CRESTOR ) 5 MG tablet, Take 1 tablet (5 mg total) by mouth daily. For cholesterol, Disp: 90 tablet, Rfl: 3   Simethicone (GAS-X PO), Take by mouth., Disp: , Rfl:    tizanidine  (ZANAFLEX ) 2 MG capsule, Take 1-2 capsules (2-4 mg total) by mouth 3 (three) times daily as needed for muscle spasms., Disp: 60 capsule, Rfl: 2   traMADol  (ULTRAM ) 50 MG tablet, Take 1 tablet (50 mg total) by mouth every 12 (twelve) hours as needed., Disp: 30 tablet, Rfl: 1   UNABLE TO FIND, Take 1 capsule by mouth daily. Dv3- vitamin D  3 plus immune support, Disp: , Rfl:  Social History   Socioeconomic History   Marital status: Married    Spouse name: [name]   Number of children: 3   Years of education: 12   Highest education level: Some college, no degree  Occupational History   Occupation: CNA    Comment: Retired  Tobacco Use   Smoking status: Never   Smokeless tobacco: Never  Vaping Use   Vaping status: Never Used  Substance and Sexual Activity   Alcohol use: No    Alcohol/week: 0.0 standard drinks of alcohol    Comment: 01-19-2016 per pt no   Drug use: No    Comment: 01-19-2016 per pt no    Sexual activity: Not Currently    Birth control/protection: Surgical   Other Topics Concern   Not on file  Social History Narrative   Lives with husband.    Social Drivers of Corporate investment banker Strain: Low Risk  (07/14/2022)   Overall Financial Resource Strain (CARDIA)    Difficulty of Paying Living Expenses: Not hard at all  Food Insecurity: No Food Insecurity (07/14/2022)   Hunger Vital Sign    Worried About Running Out of Food in the Last Year: Never  true    Ran Out of Food in the Last Year: Never true  Transportation Needs: No Transportation Needs (07/14/2022)   PRAPARE - Administrator, Civil Service (Medical): No    Lack of Transportation (Non-Medical): No  Physical Activity: Insufficiently Active (07/14/2022)   Exercise Vital Sign    Days of Exercise per Week: 3 days    Minutes of Exercise per Session: 30 min  Stress: No Stress Concern Present (07/14/2022)   [employer] of Occupational Health - Occupational Stress Questionnaire    Feeling of Stress : Not at all  Social Connections: Moderately Integrated (07/14/2022)   Social Connection and Isolation Panel    Frequency of Communication with Friends and Family: More than three times a week    Frequency of Social Gatherings with Friends and Family: More than three times a week    Attends Religious Services: 1 to 4 times per year    Active Member of [employer] or Organizations: No    Attends Banker Meetings: Never    Marital Status: Married  Catering manager Violence: Not At Risk (05/11/2023)   Received from [facility]   HITS    Over the last 12 months how often did your partner physically hurt you?: Never    Over the last 12 months how often did your partner insult you or talk down to you?: Never    Over the last 12 months how often did your partner threaten you with physical harm?: Never    Over the last 12 months how often did your partner scream or curse at you?: Never   Family History  Problem Relation Age of Onset   Hyperlipidemia Mother    Arthritis  Mother    Cancer Mother    Lymphoma Mother    Cancer Father    Allergies Father    Allergies Sister    Allergies Sister    Allergic rhinitis Neg Hx    Angioedema Neg Hx    Asthma Neg Hx    Eczema Neg Hx    Immunodeficiency Neg Hx    Urticaria Neg Hx     Objective: Office vital signs reviewed. BP 107/65   Pulse 87   Temp 98.7 F (37.1 C)   Ht 5' 8 (1.727 m)   Wt 115 lb (52.2 kg)   SpO2 95%   BMI 17.49 kg/m   Physical Examination:  General: Awake, alert, underweight female, No acute distress HEENT: Sclera white.  Moist mucous membranes Skin: Maculopapular rash noted along the ankles and inner thighs bilaterally.  No pustule or vesicular formation.  Some areas of excoriation  Assessment/ Plan: 73 y.o. female   Rash - Plan: triamcinolone  cream (KENALOG ) 0.1 %  Acute vaginitis - Plan: fluconazole  (DIFLUCAN ) 150 MG tablet  Recent urinary tract infection - Plan: Urinalysis, Routine w reflex microscopic, Urine Culture  Triamcinolone  for rash.  Diflucan  sent for vaginitis is presumably secondary to recent antibiotic use  Urinalysis without evidence of infection or other complication   Kristy Viegas CHRISTELLA Fielding, DO [facility] [phone]

## 2023-10-19 DIAGNOSIS — M62830 Muscle spasm of back: Secondary | ICD-10-CM | POA: Diagnosis not present

## 2023-10-19 DIAGNOSIS — M9903 Segmental and somatic dysfunction of lumbar region: Secondary | ICD-10-CM | POA: Diagnosis not present

## 2023-10-19 DIAGNOSIS — M9901 Segmental and somatic dysfunction of cervical region: Secondary | ICD-10-CM | POA: Diagnosis not present

## 2023-10-19 DIAGNOSIS — M9902 Segmental and somatic dysfunction of thoracic region: Secondary | ICD-10-CM | POA: Diagnosis not present

## 2023-10-20 LAB — URINE CULTURE

## 2023-11-01 ENCOUNTER — Encounter

## 2023-11-02 DIAGNOSIS — M9901 Segmental and somatic dysfunction of cervical region: Secondary | ICD-10-CM | POA: Diagnosis not present

## 2023-11-02 DIAGNOSIS — M62830 Muscle spasm of back: Secondary | ICD-10-CM | POA: Diagnosis not present

## 2023-11-02 DIAGNOSIS — M9902 Segmental and somatic dysfunction of thoracic region: Secondary | ICD-10-CM | POA: Diagnosis not present

## 2023-11-02 DIAGNOSIS — M9903 Segmental and somatic dysfunction of lumbar region: Secondary | ICD-10-CM | POA: Diagnosis not present

## 2023-11-06 ENCOUNTER — Encounter

## 2023-11-07 ENCOUNTER — Telehealth: Payer: Self-pay

## 2023-11-07 NOTE — Telephone Encounter (Signed)
Spoke with pt and appt made for tomorrow.

## 2023-11-07 NOTE — Telephone Encounter (Signed)
 Copied from CRM #[id number]. Topic: Clinical - Pink Word Triage >> Nov 07, 2023  3:41 PM Tobias L wrote: Reason for Triage: Patient calling stating she has itching and burning in vaginal area. Patient declined to speak to NT and states she will reach out to Urgent care.  FYI

## 2023-11-08 ENCOUNTER — Ambulatory Visit: Admitting: Family Medicine

## 2023-11-08 ENCOUNTER — Encounter: Payer: Self-pay | Admitting: Family Medicine

## 2023-11-08 VITALS — BP 112/80 | HR 76 | Temp 97.9°F | Ht 68.0 in | Wt 118.2 lb

## 2023-11-08 VITALS — BP 112/80 | HR 76 | Temp 97.9°F | Resp 19 | Ht 68.0 in | Wt 118.2 lb

## 2023-11-08 DIAGNOSIS — Z889 Allergy status to unspecified drugs, medicaments and biological substances status: Secondary | ICD-10-CM

## 2023-11-08 DIAGNOSIS — E7800 Pure hypercholesterolemia, unspecified: Secondary | ICD-10-CM | POA: Diagnosis not present

## 2023-11-08 DIAGNOSIS — N760 Acute vaginitis: Secondary | ICD-10-CM

## 2023-11-08 DIAGNOSIS — J4540 Moderate persistent asthma, uncomplicated: Secondary | ICD-10-CM | POA: Diagnosis not present

## 2023-11-08 LAB — URINALYSIS, ROUTINE W REFLEX MICROSCOPIC
Bilirubin, UA: NEGATIVE
Glucose, UA: NEGATIVE
Ketones, UA: NEGATIVE
Leukocytes,UA: NEGATIVE
Nitrite, UA: NEGATIVE
Protein,UA: NEGATIVE
Specific Gravity, UA: <1.005 — ABNORMAL LOW (ref 1.005–1.030)
Urobilinogen, Ur: 0.2 mg/dL (ref 0.2–1.0)
pH, UA: 7 (ref 5.0–7.5)

## 2023-11-08 LAB — WET PREP FOR TRICH, YEAST, CLUE
Clue Cell Exam: NEGATIVE
Trichomonas Exam: NEGATIVE
Yeast Exam: NEGATIVE

## 2023-11-08 LAB — MICROSCOPIC EXAMINATION
Bacteria, UA: NONE SEEN
Renal Epithel, UA: NONE SEEN /HPF
Yeast, UA: NONE SEEN

## 2023-11-08 MED ORDER — CLOBETASOL PROPIONATE 0.05 % EX CREA: 1.0000 | TOPICAL_CREAM | Freq: Every day | CUTANEOUS | 2 refills | Status: DC

## 2023-11-08 MED ORDER — EZETIMIBE 10 MG PO TABS: 10.0000 mg | ORAL_TABLET | Freq: Every day | ORAL | 3 refills | Status: DC

## 2023-11-08 NOTE — Progress Notes (Signed)
 Subjective: RR:Kristy Garza, vaginal irritation PCP: Jolinda Norene HERO, DO YEP:Fjmuyj S [name] is a 73 y.o. female presenting to clinic today for:  1. Dysuria/ vaginal irritation She reports >1 month history of vaginitis/ burning. Has known IC.  No hematuria.  Has used 6 days of fluconazole  orally with no relief. Switched to nystatin  topically but didn't help.  Has been using vagicaine which did help temporarily.  She reports no vaginal discharge or bleeding.  She did pick up some miconazole to use vaginally but read the box and was worried about using it without the supervision of this physician  2.  Asthma and allergy  Patient has known asthma and allergy  and she is to be under the care of an asthma and allergy  specialist but notes that she has not been seeing them for quite some time.  She is interested in reestablishing with 1 and her that there was a good one in [place].  She was told at some point that she might have COPD but when she had pulmonary function test done with her asthma and allergist specialist back then they did not feel that was the case.  3.  Hyperlipidemia She is not able to take statins due to intolerance and wants to know if she can go on estimating instead.  No reports of chest pain.   ROS: Per HPI  Allergies  Allergen Reactions   Cefuroxime  Axetil Other (See Comments)    Extreme gas   Gabapentin  Other (See Comments)    Constipation Constipation   Montelukast  Other (See Comments)    Numbness and tingling in hand. Numbness and tingling in hand. Numbness and tingling in hand. NUMBNESS OF HANDS AND FEET. Numbness in hands and feet NUMBNESS OF HANDS AND FEET.   Pregabalin Other (See Comments)    Drowsiness, dry mouth  Makes her tired and thirsty   Augmentin  [Amoxicillin -Pot Clavulanate] Diarrhea   Avelox [Moxifloxacin Hcl In Nacl] Other (See Comments)    Tremors    Biaxin  [Clarithromycin ]     Unsure of reaction   Cedax [Ceftibuten] Other (See  Comments)    tremors   Ciprofloxacin  Other (See Comments) and Nausea And Vomiting    Blurred vision Blurred vision Pt can't remember reaction   Clindamycin/Lincomycin     tachycardia   Doxycycline  Diarrhea    Nausea , stomach upset   Levofloxacin Other (See Comments)    Feels dehydrated   Lincomycin Other (See Comments)    tachycardia   Montelukast  Sodium Other (See Comments)    Numbness and tingling in hand. Numbness and tingling in hand.   Sulfonamide Derivatives Other (See Comments)    Gi upset   Sulfa Antibiotics Nausea Only and Other (See Comments)    Other reaction(s): Other (See Comments)  Gi upset  Other reaction(s): Unknown   Past Medical History:  Diagnosis Date   Abdominal pain 01/16/2013   Acid reflux    Allergy     Arthritis    ARTHRITIS IN NECK BY DR. MERILEE ISSAC   Asthma    Interstitial cystitis    Osteoporosis    PONV (postoperative nausea and vomiting)    Tinnitus    Trigeminal neuralgia    Atypical trigeminal neuralgia    Current Outpatient Medications:    acetaminophen  (TYLENOL ) 500 MG tablet, Take by mouth., Disp: , Rfl:    albuterol  (VENTOLIN  HFA) 108 (90 Base) MCG/ACT inhaler, TAKE 2 PUFFS BY MOUTH EVERY 6 HOURS AS NEEDED FOR WHEEZE OR SHORTNESS OF BREATH, Disp: 18 each, Rfl:  0   ARNUITY ELLIPTA  200 MCG/ACT AEPB, TAKE 1 PUFF BY MOUTH EVERY DAY, Disp: 30 each, Rfl: 5   ascorbic acid (VITAMIN C) 1000 MG tablet, Take by mouth., Disp: , Rfl:    aspirin-acetaminophen -caffeine  (EXCEDRIN MIGRAINE) 250-250-65 MG tablet, Take by mouth., Disp: , Rfl:    azelastine  (ASTELIN ) 0.1 % nasal spray, Place 2 sprays into both nostrils 2 (two) times daily. Use in each nostril as directed, Disp: 30 mL, Rfl: 12   b complex vitamins capsule, Take 1 capsule by mouth daily., Disp: , Rfl:    Cholecalciferol (VITAMIN D3) 125 MCG (5000 UT) CAPS, Take 5,000 Units by mouth daily., Disp: , Rfl:    estradiol  (ESTRACE  VAGINAL) 0.1 MG/GM vaginal cream, Apply 0.5 grams  vaginally 3 days per week., Disp: 42.5 g, Rfl: 12   famotidine  (PEPCID ) 20 MG tablet, Take 1 tablet (20 mg total) by mouth 2 (two) times daily as needed for heartburn or indigestion., Disp: 180 tablet, Rfl: 1   hyoscyamine (LEVSIN SL) 0.125 MG SL tablet, Place under the tongue every 4 (four) hours as needed., Disp: , Rfl:    Lidocaine -Collagen-Aloe Vera (REGENECARE) 2 % GEL, Apply topically daily., Disp: 85 g, Rfl: 2   melatonin 5 MG TABS, Take 5 mg by mouth., Disp: , Rfl:    mometasone  (NASONEX ) 50 MCG/ACT nasal spray, Place 2 sprays into the nose daily., Disp: 17 g, Rfl: 12   naloxegol  oxalate (MOVANTIK ) 25 MG TABS tablet, Take 1 tablet (25 mg total) by mouth daily., Disp: 90 tablet, Rfl: 3   NALTREXONE HCL PO, Take by mouth., Disp: , Rfl:    olopatadine  (PATANOL) 0.1 % ophthalmic solution, Place 1 drop into both eyes 2 (two) times daily as needed for allergies., Disp: 5 mL, Rfl: 5   ondansetron  (ZOFRAN ) 4 MG tablet, Take 1 tablet (4 mg total) by mouth 3 (three) times daily as needed for nausea or vomiting., Disp: 20 tablet, Rfl: 1   Polyethylene Glycol 3350  (MIRALAX  PO), Take by mouth as needed., Disp: , Rfl:    Probiotic Product (PROBIOTIC PO), Take by mouth daily., Disp: , Rfl:    rOPINIRole  (REQUIP ) 0.25 MG tablet, Take 1 tablet (0.25 mg total) by mouth at bedtime. May increase to 2 tablets at bedtime after 2 weeks., Disp: 180 tablet, Rfl: 1   rosuvastatin  (CRESTOR ) 5 MG tablet, Take 1 tablet (5 mg total) by mouth daily. For cholesterol, Disp: 90 tablet, Rfl: 3   Simethicone (GAS-X PO), Take by mouth., Disp: , Rfl:    tizanidine  (ZANAFLEX ) 2 MG capsule, Take 1-2 capsules (2-4 mg total) by mouth 3 (three) times daily as needed for muscle spasms., Disp: 60 capsule, Rfl: 2   traMADol  (ULTRAM ) 50 MG tablet, Take 1 tablet (50 mg total) by mouth every 12 (twelve) hours as needed., Disp: 30 tablet, Rfl: 1   triamcinolone  cream (KENALOG ) 0.1 %, Apply to affected areas twice daily as needed for rash  for up to 10 days., Disp: 30 g, Rfl: 0   UNABLE TO FIND, Take 1 capsule by mouth daily. Dv3- vitamin D  3 plus immune support, Disp: , Rfl:  Social History   Socioeconomic History   Marital status: Married    Spouse name: [name]   Number of children: 3   Years of education: 12   Highest education level: Some college, no degree  Occupational History   Occupation: CNA    Comment: Retired  Tobacco Use   Smoking status: Never   Smokeless tobacco: Never  Vaping Use   Vaping status: Never Used  Substance and Sexual Activity   Alcohol use: No    Alcohol/week: 0.0 standard drinks of alcohol    Comment: 01-19-2016 per pt no   Drug use: No    Comment: 01-19-2016 per pt no    Sexual activity: Not Currently    Birth control/protection: Surgical  Other Topics Concern   Not on file  Social History Narrative   Lives with husband.    Social Drivers of Corporate investment banker Strain: Low Risk  (07/14/2022)   Overall Financial Resource Strain (CARDIA)    Difficulty of Paying Living Expenses: Not hard at all  Food Insecurity: No Food Insecurity (07/14/2022)   Hunger Vital Sign    Worried About Running Out of Food in the Last Year: Never true    Ran Out of Food in the Last Year: Never true  Transportation Needs: No Transportation Needs (07/14/2022)   PRAPARE - Administrator, Civil Service (Medical): No    Lack of Transportation (Non-Medical): No  Physical Activity: Insufficiently Active (07/14/2022)   Exercise Vital Sign    Days of Exercise per Week: 3 days    Minutes of Exercise per Session: 30 min  Stress: No Stress Concern Present (07/14/2022)   [employer] of Occupational Health - Occupational Stress Questionnaire    Feeling of Stress : Not at all  Social Connections: Moderately Integrated (07/14/2022)   Social Connection and Isolation Panel    Frequency of Communication with Friends and Family: More than three times a week    Frequency of Social Gatherings with  Friends and Family: More than three times a week    Attends Religious Services: 1 to 4 times per year    Active Member of [employer] or Organizations: No    Attends Banker Meetings: Never    Marital Status: Married  Catering manager Violence: Not At Risk (05/11/2023)   Received from [facility]   HITS    Over the last 12 months how often did your partner physically hurt you?: Never    Over the last 12 months how often did your partner insult you or talk down to you?: Never    Over the last 12 months how often did your partner threaten you with physical harm?: Never    Over the last 12 months how often did your partner scream or curse at you?: Never   Family History  Problem Relation Age of Onset   Hyperlipidemia Mother    Arthritis Mother    Cancer Mother    Lymphoma Mother    Cancer Father    Allergies Father    Allergies Sister    Allergies Sister    Allergic rhinitis Neg Hx    Angioedema Neg Hx    Asthma Neg Hx    Eczema Neg Hx    Immunodeficiency Neg Hx    Urticaria Neg Hx     Objective: Office vital signs reviewed. BP 112/80   Pulse 76   Temp 97.9 F (36.6 C)   Ht 5' 8 (1.727 m)   Wt 118 lb 4 oz (53.6 kg)   SpO2 100%   BMI 17.98 kg/m   Physical Examination:  General: Awake, alert, thin elderly female, No acute distress HEENT: Sclera white.  Moist membranes.  Clears throat frequently Cardio: regular rate and rhythm, S1S2 heard, no murmurs appreciated Pulm: clear to auscultation bilaterally, no wheezes, rhonchi or rales; normal work of breathing on  room air  Results for orders placed or performed in visit on 11/08/23 (from the past 24 hours)  WET PREP FOR TRICH, YEAST, CLUE     Status: None   Collection Time: 11/08/23  3:15 PM   Specimen: Vaginal Swab   Vaginal Swab  Result Value Ref Range   Trichomonas Exam Negative Negative   Yeast Exam Negative Negative   Clue Cell Exam Negative Negative   Narrative   Performed at:  [address] - [facility] [address], [place], [place]  [id number] Lab Director: Stefano Sprang [facility], Phone:  [phone]  Urinalysis, Routine w reflex microscopic     Status: Abnormal   Collection Time: 11/08/23  3:17 PM  Result Value Ref Range   Specific Gravity, UA <1.005 (L) 1.005 - 1.030   pH, UA 7.0 5.0 - 7.5   Color, UA Yellow Yellow   Appearance Ur Clear Clear   Leukocytes,UA Negative Negative   Protein,UA Negative Negative/Trace   Glucose, UA Negative Negative   Ketones, UA Negative Negative   RBC, UA Trace (A) Negative   Bilirubin, UA Negative Negative   Urobilinogen, Ur 0.2 0.2 - 1.0 mg/dL   Nitrite, UA Negative Negative   Microscopic Examination See below:    Narrative   Performed at:  [address] - [facility] [address] [place], [place]  [id number] Lab Director: Stefano Sprang [facility], Phone:  [phone]  Microscopic Examination     Status: None   Collection Time: 11/08/23  3:17 PM   Urine  Result Value Ref Range   WBC, UA 0-5 0 - 5 /hpf   RBC, Urine 0-2 0 - 2 /hpf   Epithelial Cells (non renal) 0-10 0 - 10 /hpf   Renal Epithel, UA None seen None seen /hpf   Bacteria, UA None seen None seen/Few   Yeast, UA None seen None seen   Narrative   Performed at:  [address] - [facility] [address] [place], [place]  [id number] Lab Director: Stefano Sprang [facility], Phone:  [phone]   *Note: Due to a large number of results and/or encounters for the requested time period, some results have not been displayed. A complete set of results can be found in Results Review.     Assessment/ Plan: 73 y.o. female   Acute vaginitis - Plan: WET PREP FOR TRICH, YEAST, CLUE, Urinalysis, Routine w reflex microscopic, clobetasol  cream (TEMOVATE ) 0.05 %, Ambulatory referral to Obstetrics / Gynecology  Moderate persistent asthma without complication - Plan: Ambulatory referral to Allergy   Multiple allergies - Plan: Ambulatory referral to Allergy   Pure hypercholesterolemia - Plan: ezetimibe  (ZETIA ) 10 MG  tablet  She had negative urinalysis and negative wet prep.  Given treatment with oral and topical antifungals I do not feel that this is a yeast vaginitis but I do question if maybe she has a lichen sclerosus.  I am going to place her on topical betazole temporarily but I do want her to see OB/GYN for further evaluation and potential biopsy of skin to determine etiology of symptoms.  Referral to allergy  and asthma also placed per her request.  Zetia  to replace rosuvastatin  given statin intolerance   Melyna Huron CHRISTELLA Fielding, DO [facility] [phone]

## 2023-11-08 NOTE — Patient Instructions (Signed)
 I think you have this condition but sending you to ob/gyn to double check  Itchy, Painful, White Skin Patches (Lichen Sclerosus): What to Know Lichen sclerosus is a skin problem. It can happen on any part of your body. It happens most often in the areas around your genitals or the opening of your butt (anus). Treatment can help to control symptoms. It can also help prevent scarring that may lead to other problems. This skin problem isn't an infection or a fungus. It can't be passed from person to person. What are the causes? The cause of this condition isn't known. It may be related to: The body's defense system, also called the immune system, reacting too strongly. A lack of certain hormones. What increases the risk? Being a female who has stopped having periods. This is called menopause. Being a female who wasn't circumcised. Being a child who hasn't reached puberty yet. What are the signs or symptoms?  Thin, wrinkled, white areas on the skin. Thickened white areas on the skin. Red and swollen patches on the skin. Tears or cracks in the skin. Bruising. Blood blisters. Very bad itching. Pain, itching, or burning when peeing. Trouble pooping, especially in children. How is this treated? This condition may be treated with: Topical steroids. These are creams or ointments that are put on the skin. Medicines that are taken by mouth. Topical immunotherapy. These are creams and ointments that you put on the skin to give your body's defense system a boost. Surgery. This may need to be done if there are problems like scarring. Follow these instructions at home: Medicines Take or apply your medicines only as told. Use creams or ointments as told by your doctor. Skin care Do not scratch the affected areas. Keep the affected areas clean and dry. Clean the affected area gently with water  only. Avoid using rough towels or toilet paper. Avoid products that irritate the skin. These include  scented soaps, lotions, and bubble bath. Use thick creams or ointments to lessen itching as told. General instructions Your condition may cause trouble pooping. To help prevent or treat this, you may need to: Take medicines to help you poop. Eat foods high in fiber, like beans, whole grains, and fresh fruits and vegetables. Drink more fluids as told. Keep all follow-up visits. This helps make sure the treatment plan is working. Contact a doctor if: You have more redness, swelling, or pain. You have fluid, blood, or pus coming from the area. You have new patches on your skin. You have a fever. You have pain during sex. This information is not intended to replace advice given to you by your health care provider. Make sure you discuss any questions you have with your health care provider. Document Revised: 10/18/2022 Document Reviewed: 10/18/2022 Elsevier Patient Education  2024 [employer].

## 2023-11-10 NOTE — Telephone Encounter (Signed)
 Sadly, I'm not sure what else to do for her.  I really want her to see OB.GYN ASAP.

## 2023-11-10 NOTE — Telephone Encounter (Unsigned)
 Copied from CRM [id number]. Topic: Clinical - Medical Advice >> Nov 10, 2023 10:53 AM Montie POUR wrote: Reason for CRM:  She came in for an office visit on 11/08/23 and clobetasol  cream (TEMOVATE ) 0.05 % is not working for her burning and itching. She wants to see if Dr. Jolinda can give her another medication. Please call her with questions at [phone]

## 2023-11-13 DIAGNOSIS — B079 Viral wart, unspecified: Secondary | ICD-10-CM | POA: Diagnosis not present

## 2023-11-13 DIAGNOSIS — L821 Other seborrheic keratosis: Secondary | ICD-10-CM | POA: Diagnosis not present

## 2023-11-13 DIAGNOSIS — L300 Nummular dermatitis: Secondary | ICD-10-CM | POA: Diagnosis not present

## 2023-11-13 DIAGNOSIS — L720 Epidermal cyst: Secondary | ICD-10-CM | POA: Diagnosis not present

## 2023-11-13 NOTE — Telephone Encounter (Signed)
 Pt aware and has appt with GYN tomorrow.

## 2023-11-14 ENCOUNTER — Ambulatory Visit: Admitting: Adult Health

## 2023-11-14 ENCOUNTER — Encounter: Payer: Self-pay | Admitting: Adult Health

## 2023-11-14 VITALS — BP 109/71 | HR 88 | Ht 68.0 in | Wt 116.0 lb

## 2023-11-14 DIAGNOSIS — N898 Other specified noninflammatory disorders of vagina: Secondary | ICD-10-CM | POA: Diagnosis not present

## 2023-11-14 DIAGNOSIS — N952 Postmenopausal atrophic vaginitis: Secondary | ICD-10-CM | POA: Insufficient documentation

## 2023-11-14 DIAGNOSIS — L900 Lichen sclerosus et atrophicus: Secondary | ICD-10-CM | POA: Diagnosis not present

## 2023-11-14 NOTE — Progress Notes (Signed)
 Subjective:     Patient ID: Kristy Garza, female   DOB: 1951/03/11, 73 y.o.   MRN: [id number]  HPI Kristy Garza is a 73 year old white female,married, sp hysterectomy in complaining of itching and burning in vaginal area for over a month now, has seen PCP and started on vaginal estrogen and temovate , 11/08/23. She had tried Vagicaine and diflucan , the Vagicaine helped the most. She has history of IC. PCP is Dr Jolinda   Review of Systems +itching and burning in vaginal area for over a month now, has seen PCP and started on vaginal estrogen and temovate , 11/08/23. She had tried Vagicaine and diflucan , the Vagicaine helped the most.  Not having sex Reviewed past medical,surgical, social and family history. Reviewed medications and allergies.     Objective:   Physical Exam BP 109/71 (BP Location: Right Arm, Patient Position: Sitting, Cuff Size: Normal)   Pulse 88   Ht 5' 8 (1.727 m)   Wt 116 lb (52.6 kg)   BMI 17.64 kg/m     Skin warm and dry.Pelvic: external genitalia is normal in appearance, the vulva is slightly red, vagina:pale and atrophic, has thin white skin at introitus and some redness on left,urethra has small caruncle, cervix and uterus are absent, adnexa: no masses or tenderness noted. Bladder is non tender and no masses felt. AA is 0    11/14/2023   12:15 PM 10/18/2023   10:25 AM 08/29/2023    8:54 AM  Depression screen PHQ 2/9  Decreased Interest 1 0 0  Down, Depressed, Hopeless 0 0 0  PHQ - 2 Score 1 0 0  Altered sleeping 1 0 0  Tired, decreased energy 1 0 0  Change in appetite 0 0 0  Feeling bad or failure about yourself  0 0 0  Trouble concentrating 0 0 0  Moving slowly or fidgety/restless 0 0 0  Suicidal thoughts 0 0 0  PHQ-9 Score 3 0 0  Difficult doing work/chores  Not difficult at all Not difficult at all       11/14/2023   12:16 PM 10/18/2023   10:24 AM 08/29/2023    8:54 AM 07/27/2023   11:14 AM  GAD 7 : Generalized Anxiety Score  Nervous, Anxious, on Edge 1  0 0 0  Control/stop worrying 0 0 0 0  Worry too much - different things 0 0 0 0  Trouble relaxing 1 0 0 0  Restless 0 0 0 0  Easily annoyed or irritable 0 0 0 0  Afraid - awful might happen 0 0 0 0  Total GAD 7 Score 2 0 0 0  Anxiety Difficulty  Not difficult at all Not difficult at all Not difficult at all    Upstream - 11/14/23 1213       Pregnancy Intention Screening   Does the patient want to become pregnant in the next year? N/A    Does the patient's partner want to become pregnant in the next year? N/A    Would the patient like to discuss contraceptive options today? N/A      Contraception Wrap Up   Current Method Female Sterilization   hyst   End Method Female Sterilization   hyst   Contraception Counseling Provided No           Examination chaperoned by Clarita Salt LPN  Assessment:     1. Itching in the vaginal area (Primary) Has had itching and burning for over a month now  2. Lichen  sclerosus et atrophicus Use temovate  from PCP, nightly for 2 weeks then 2 x weekly Do not wear a pad  Do not rub, pat Will recheck about 12/13/23 to see if better, if not will get punch biopsy  She is aware it is chronic Showed pictures in  [facility] and gave handout   3. Vaginal atrophy Use vaginal estrogen every other night     Plan:     Follow up about 12/13/23 for recheck

## 2023-11-16 DIAGNOSIS — M9903 Segmental and somatic dysfunction of lumbar region: Secondary | ICD-10-CM | POA: Diagnosis not present

## 2023-11-16 DIAGNOSIS — M9902 Segmental and somatic dysfunction of thoracic region: Secondary | ICD-10-CM | POA: Diagnosis not present

## 2023-11-16 DIAGNOSIS — M62830 Muscle spasm of back: Secondary | ICD-10-CM | POA: Diagnosis not present

## 2023-11-16 DIAGNOSIS — M9901 Segmental and somatic dysfunction of cervical region: Secondary | ICD-10-CM | POA: Diagnosis not present

## 2023-11-17 ENCOUNTER — Other Ambulatory Visit: Payer: Self-pay | Admitting: Family Medicine

## 2023-11-17 DIAGNOSIS — Z1231 Encounter for screening mammogram for malignant neoplasm of breast: Secondary | ICD-10-CM

## 2023-11-22 ENCOUNTER — Ambulatory Visit
Admission: RE | Admit: 2023-11-22 | Discharge: 2023-11-22 | Disposition: A | Discharge status: Home / Self Care | Source: Ambulatory Visit | Attending: Family Medicine | Admitting: Family Medicine

## 2023-11-22 DIAGNOSIS — Z1231 Encounter for screening mammogram for malignant neoplasm of breast: Secondary | ICD-10-CM

## 2023-11-23 ENCOUNTER — Encounter: Payer: Self-pay | Admitting: Adult Health

## 2023-11-23 ENCOUNTER — Ambulatory Visit: Admitting: Adult Health

## 2023-11-23 VITALS — BP 114/74 | HR 74 | Ht 68.0 in | Wt 116.0 lb

## 2023-11-23 DIAGNOSIS — L900 Lichen sclerosus et atrophicus: Secondary | ICD-10-CM

## 2023-11-23 DIAGNOSIS — N898 Other specified noninflammatory disorders of vagina: Secondary | ICD-10-CM

## 2023-11-23 DIAGNOSIS — N952 Postmenopausal atrophic vaginitis: Secondary | ICD-10-CM

## 2023-11-23 NOTE — Progress Notes (Addendum)
  Subjective:     Patient ID: Kristy Garza, female   DOB: 12/10/1950, 73 y.o.   MRN: [id number]  HPI Kristy Garza is a 73 year old white female, married, PM in complaining of itching and burning still, wants punch biopsy.  PCP is Dr Jolinda   Review of Systems Still itching and burning    Reviewed past medical,surgical, social and family history. Reviewed medications and allergies.  Objective:   Physical Exam BP 114/74 (BP Location: Left Arm, Patient Position: Sitting, Cuff Size: Normal)   Pulse 74   Ht 5' 8 (1.727 m)   Wt 116 lb (52.6 kg)   BMI 17.64 kg/m      Skin warm and dry.Pelvic: external genitalia is normal in appearance, the vulva is slightly red, vagina:pale and atrophic, has thin white skin at introitus and some redness on left.  Upstream - 11/23/23 1405       Pregnancy Intention Screening   Does the patient want to become pregnant in the next year? N/A    Does the patient's partner want to become pregnant in the next year? N/A    Would the patient like to discuss contraceptive options today? N/A      Contraception Wrap Up   Current Method Female Sterilization   hyst   End Method Female Sterilization   hyst   Contraception Counseling Provided No         Examination chaperoned by Clarita Salt LPN  Assessment:     1. Itching in the vaginal area (Primary) Still has itching  Can use vagisil, in thin layer  2. Lichen sclerosus et atrophicus Use temovate  2-3 x weekly thin layer Reinforced this is chronic, can take a while to feel better Only 3%chance of SCC, but will get appt with Dr Ozan for punch biopsy  3. Vaginal atrophy Continue estrace  vaginal cream about 3 x weekly    Plan:     Return 12/08/23 to see Dr Ozan for ?punch biopsy

## 2023-11-30 DIAGNOSIS — M9901 Segmental and somatic dysfunction of cervical region: Secondary | ICD-10-CM | POA: Diagnosis not present

## 2023-11-30 DIAGNOSIS — M62830 Muscle spasm of back: Secondary | ICD-10-CM | POA: Diagnosis not present

## 2023-11-30 DIAGNOSIS — M9903 Segmental and somatic dysfunction of lumbar region: Secondary | ICD-10-CM | POA: Diagnosis not present

## 2023-11-30 DIAGNOSIS — M9902 Segmental and somatic dysfunction of thoracic region: Secondary | ICD-10-CM | POA: Diagnosis not present

## 2023-12-08 ENCOUNTER — Ambulatory Visit: Admitting: Obstetrics & Gynecology

## 2023-12-08 ENCOUNTER — Encounter: Payer: Self-pay | Admitting: Obstetrics & Gynecology

## 2023-12-08 ENCOUNTER — Other Ambulatory Visit (HOSPITAL_COMMUNITY)
Admission: RE | Admit: 2023-12-08 | Discharge: 2023-12-08 | Disposition: A | Discharge status: Home / Self Care | Source: Ambulatory Visit | Attending: Obstetrics & Gynecology | Admitting: Obstetrics & Gynecology

## 2023-12-08 VITALS — BP 118/73 | HR 68 | Ht 68.0 in | Wt 114.4 lb

## 2023-12-08 DIAGNOSIS — N952 Postmenopausal atrophic vaginitis: Secondary | ICD-10-CM | POA: Diagnosis not present

## 2023-12-08 DIAGNOSIS — L900 Lichen sclerosus et atrophicus: Secondary | ICD-10-CM | POA: Diagnosis not present

## 2023-12-08 DIAGNOSIS — N9089 Other specified noninflammatory disorders of vulva and perineum: Secondary | ICD-10-CM

## 2023-12-08 NOTE — Progress Notes (Signed)
 GYN VISIT Patient name: Kristy Garza MRN [id number]  Date of birth: 1950/12/06 Chief Complaint:   Punch Biopsy  History of Present Illness:   Kristy Garza is a 73 y.o. G2P0 PM female being seen today for follow up regarding:     Lichen sclerosus: Using clobetasol  twice a week- still having some mild itching. Rates her symptoms 4/10.  Previously noted considerable redness, irritation and itching- severe.  Denies vaginal discharge or odor.  Denies pelvic or abdominal pain.  Reports no other acute gyn concerns.  No LMP recorded. Patient has had a hysterectomy.    Review of Systems:   Pertinent items are noted in HPI Denies fever/chills, dizziness, headaches, visual disturbances, fatigue, shortness of breath, chest pain, abdominal pain, vomiting. Pertinent History Reviewed:   Past Surgical History:  Procedure Laterality Date   ABDOMINAL HYSTERECTOMY  2000   CYSTOSCOPY WITH HYDRODISTENSION AND BIOPSY N/A 06/11/2012   Procedure: CYSTOSCOPY/BIOPSY/HYDRODISTENSION with Instillation of Pyridium  and Marcaine  and Kenalog ;  Surgeon: Arlena LILLETTE Gal, MD;  Location: [facility];  Service: Urology;  Laterality: N/A;  1 hour requested for this case  BLADDER BIOPSY   ETHMOIDECTOMY  2012   SEPTOPLASTY  1980's   vocal cord surgery   1990's   polyp removal    Past Medical History:  Diagnosis Date   Abdominal pain 01/16/2013   Acid reflux    Allergy     Arthritis    ARTHRITIS IN NECK BY DR. MERILEE ISSAC   Asthma    Fibroids    Interstitial cystitis    Osteoporosis    PONV (postoperative nausea and vomiting)    Tinnitus    Trigeminal neuralgia    Atypical trigeminal neuralgia   Reviewed problem list, medications and allergies. Physical Assessment:   Vitals:   12/08/23 0838  BP: 118/73  Pulse: 68  Weight: 114 lb 6.4 oz (51.9 kg)  Height: 5' 8 (1.727 m)  Body mass index is 17.39 kg/m.       Physical Examination:   General appearance: alert, well  appearing, and in no distress  Psych: mood appropriate, normal affect  Skin: warm & dry   Cardiovascular: normal heart rate noted  Respiratory: normal respiratory effort, no distress  Abdomen: soft, non-tender   Pelvic: VULVA: thin normal appearing vulva with no masses, tenderness or lesions.  No discrete abnormalities noted.  Some hypopigmentation around perineum seen narrowed introitus appreciated vaginal mucosa appears pink and dry with loss of rugae.  Extremities: no edema   Chaperone: Aleck Blase    VULVAR BIOPSY NOTE The indications for vulvar biopsy (rule out neoplasia, establish lichen sclerosus diagnosis) were reviewed.   Risks of the biopsy including pain, bleeding, infection, inadequate specimen, scarring and need for additional procedures  were discussed. The patient stated understanding and agreed to undergo procedure today. Consent was signed,  time out performed.  The patient's vulva was prepped with Betadine. 1% lidocaine  was injected into the left perineal area. A 3-mm punch biopsy was done, biopsy tissue was picked up with sterile forceps and sterile scissors were used to excise the lesion.  Small bleeding was noted and hemostasis was achieved using silver nitrate sticks.  The patient tolerated the procedure well.  Assessment & Plan:  1) Vulvar irritation, vaginal atrophy, lichen sclerosus, vulvar atrophy Post-procedure instructions  (pelvic rest for one week) were given to the patient. The patient is to call with heavy bleeding, fever greater than 100.4, foul smelling vaginal discharge or other concerns. The  patient will be return to clinic in two weeks for discussion of results. -further management pending results -suspect atrophic vaginitis may be part of her issue.  Patient notes that she does have estradiol  but it seems like she is not using.  Encourage patient to get back to twice weekly usage -ok to continue with Clobetasol  -f/u prn or 35yr check in   No orders of  the defined types were placed in this encounter.   Return in about 1 year (around 12/07/2024) for LS/atrophy follow up.   Doye Montilla, DO Attending Obstetrician & Gynecologist, [facility] for [employer], [facility]

## 2023-12-11 ENCOUNTER — Ambulatory Visit: Payer: Self-pay | Admitting: Obstetrics & Gynecology

## 2023-12-12 LAB — SURGICAL PATHOLOGY

## 2023-12-13 ENCOUNTER — Ambulatory Visit: Admitting: Adult Health

## 2023-12-14 DIAGNOSIS — M9903 Segmental and somatic dysfunction of lumbar region: Secondary | ICD-10-CM | POA: Diagnosis not present

## 2023-12-14 DIAGNOSIS — M9901 Segmental and somatic dysfunction of cervical region: Secondary | ICD-10-CM | POA: Diagnosis not present

## 2023-12-14 DIAGNOSIS — M9902 Segmental and somatic dysfunction of thoracic region: Secondary | ICD-10-CM | POA: Diagnosis not present

## 2023-12-14 DIAGNOSIS — M62830 Muscle spasm of back: Secondary | ICD-10-CM | POA: Diagnosis not present

## 2023-12-22 ENCOUNTER — Telehealth: Payer: Self-pay | Admitting: Obstetrics & Gynecology

## 2023-12-22 NOTE — Telephone Encounter (Signed)
 Patient calling stating that she is still having a lot of itching and wants to know if she can use the cream everyday until it is gone asking for a call back

## 2023-12-28 NOTE — Telephone Encounter (Signed)
-   Called patient to review, seems as though itching has somewhat improved - Would advise estrogen only twice a week at most 3 times.  If she felt like she needed something in between for dryness recommended over-the-counter Replens or Lavina - Reviewed biopsy report, negative for lichen sclerosis.  She does not need to be using clobetasol  regularly

## 2024-01-02 ENCOUNTER — Encounter: Payer: Self-pay | Admitting: Allergy and Immunology

## 2024-01-02 ENCOUNTER — Ambulatory Visit: Admitting: Allergy and Immunology

## 2024-01-02 VITALS — BP 100/50 | HR 74 | Temp 98.0°F | Ht 67.0 in | Wt 118.0 lb

## 2024-01-02 DIAGNOSIS — K219 Gastro-esophageal reflux disease without esophagitis: Secondary | ICD-10-CM

## 2024-01-02 DIAGNOSIS — J310 Chronic rhinitis: Secondary | ICD-10-CM | POA: Diagnosis not present

## 2024-01-02 DIAGNOSIS — J4530 Mild persistent asthma, uncomplicated: Secondary | ICD-10-CM | POA: Diagnosis not present

## 2024-01-02 DIAGNOSIS — H04123 Dry eye syndrome of bilateral lacrimal glands: Secondary | ICD-10-CM

## 2024-01-02 NOTE — Progress Notes (Unsigned)
 [place] - [place] - [place] - [place] - [place]   Follow-up Note  Referring Provider: Jolinda Norene HERO, DO Primary Provider: Jolinda Norene HERO, DO Date of Office Visit: 01/02/2024  Subjective:   Kristy Garza (DOB: Apr 05, 1950) is a 73 y.o. female who [facility] in re-evaluation of the following:  HPI: Nishita presents to this clinic in evaluation of asthma, rhinitis, LPR, dry eye.  I have not seen her in this clinic since 06 December 2017.  Her last visit to this clinic was 07 April 2023 at which point time she received low-dose systemic steroids for respiratory tract symptoms.  She states that for the past month she has been having problems with her respiratory tract.  She has coughing and scratchy throat and drainage in her throat and she has had some headache and she gets some itchy eyes whenever she goes outdoors.  She had a pretty good summer and her very active spring with similar type of symptoms.  This occurs while using Arnuity just twice a month and using Nasonex  and azelastine  nasal spray about 1 time per week.  She does use 2 Benadryls at night to help her sleep.  This causes her eyes to get extremely dry.  She does use Pataday  and a wetting solution eyedrop on a pretty common basis.  Her reflux appears to be under very good control.  She rarely uses famotidine  at this point.  Allergies as of 01/02/2024       Reactions   Cefuroxime  Axetil Other (See Comments)   Extreme gas   Gabapentin  Other (See Comments)   Constipation Constipation   Montelukast  Other (See Comments)   Numbness and tingling in hand. Numbness and tingling in hand. Numbness and tingling in hand. NUMBNESS OF HANDS AND FEET. Numbness in hands and feet NUMBNESS OF HANDS AND FEET.   Pregabalin Other (See Comments)   Drowsiness, dry mouth Makes her tired and thirsty   Augmentin  [amoxicillin -pot Clavulanate] Diarrhea   Avelox [moxifloxacin  Hcl In Nacl] Other (See Comments)   Tremors   Biaxin  [clarithromycin ]    Unsure of reaction   Cedax [ceftibuten] Other (See Comments)   tremors   Ciprofloxacin  Other (See Comments), Nausea And Vomiting   Blurred vision Blurred vision Pt can't remember reaction   Clindamycin/lincomycin    tachycardia   Doxycycline  Diarrhea   Nausea , stomach upset   Levofloxacin Other (See Comments)   Feels dehydrated   Lincomycin Other (See Comments)   tachycardia   Montelukast  Sodium Other (See Comments)   Numbness and tingling in hand. Numbness and tingling in hand.   Sulfonamide Derivatives Other (See Comments)   Gi upset   Sulfa Antibiotics Nausea Only, Other (See Comments)   Other reaction(s): Other (See Comments) Gi upset Other reaction(s): Unknown        Medication List    acetaminophen  500 MG tablet Commonly known as: TYLENOL  Take by mouth.   albuterol  108 (90 Base) MCG/ACT inhaler Commonly known as: VENTOLIN  HFA TAKE 2 PUFFS BY MOUTH EVERY 6 HOURS AS NEEDED FOR WHEEZE OR SHORTNESS OF BREATH   Arnuity Ellipta  200 MCG/ACT Aepb Generic drug: Fluticasone  Furoate TAKE 1 PUFF BY MOUTH EVERY DAY What changed: See the new instructions.   azelastine  0.1 % nasal spray Commonly known as: ASTELIN  Place 2 sprays into both nostrils 2 (two) times daily. Use in each nostril as directed   b complex vitamins capsule Take 1 capsule by mouth daily.  BENADRYL  PO Take by mouth.   clobetasol  cream 0.05 % Commonly known as: TEMOVATE  Apply 1 Application topically at bedtime. X6 weeks. Then apply to vulva only twice weekly thereafter.   COLACE PO Take by mouth.   estradiol  0.1 MG/GM vaginal cream Commonly known as: ESTRACE  VAGINAL Apply 0.5 grams vaginally 3 days per week.   EXCEDRIN EXTRA STRENGTH PO Take by mouth.   ezetimibe  10 MG tablet Commonly known as: Zetia  Take 1 tablet (10 mg total) by mouth daily.   famotidine  20 MG tablet Commonly known as: PEPCID  Take 1 tablet  (20 mg total) by mouth 2 (two) times daily as needed for heartburn or indigestion.   GAS-X PO Take by mouth.   MAGNESIUM PO Take by mouth.   melatonin 5 MG Tabs Take 5 mg by mouth.   MILK OF MAGNESIA PO Take by mouth.   mometasone  50 MCG/ACT nasal spray Commonly known as: Nasonex  Place 2 sprays into the nose daily.   MUCINEX  PO Take by mouth.   ondansetron  4 MG tablet Commonly known as: Zofran  Take 1 tablet (4 mg total) by mouth 3 (three) times daily as needed for nausea or vomiting.   PROBIOTIC PO Take by mouth daily.   Regenecare 2 % Gel Generic drug: Lidocaine -Collagen-Aloe Vera Apply topically daily.   ROPINIROLE  HCL PO Take by mouth.   tizanidine  2 MG capsule Commonly known as: ZANAFLEX  Take 1-2 capsules (2-4 mg total) by mouth 3 (three) times daily as needed for muscle spasms.   traMADol  50 MG tablet Commonly known as: ULTRAM  Take 1 tablet (50 mg total) by mouth every 12 (twelve) hours as needed.   triamcinolone  cream 0.1 % Commonly known as: KENALOG  Apply to affected areas twice daily as needed for rash for up to 10 days.   Vitamin D3 125 MCG (5000 UT) Caps Take 5,000 Units by mouth daily.    Past Medical History:  Diagnosis Date   Abdominal pain 01/16/2013   Acid reflux    Allergy     Arthritis    ARTHRITIS IN NECK BY DR. MERILEE ISSAC   Asthma    Fibroids    Interstitial cystitis    Osteoporosis    PONV (postoperative nausea and vomiting)    Tinnitus    Trigeminal neuralgia    Atypical trigeminal neuralgia    Past Surgical History:  Procedure Laterality Date   ABDOMINAL HYSTERECTOMY  2000   CYSTOSCOPY WITH HYDRODISTENSION AND BIOPSY N/A 06/11/2012   Procedure: CYSTOSCOPY/BIOPSY/HYDRODISTENSION with Instillation of Pyridium  and Marcaine  and Kenalog ;  Surgeon: Arlena LILLETTE Gal, MD;  Location: [facility];  Service: Urology;  Laterality: N/A;  1 hour requested for this case  BLADDER BIOPSY   ETHMOIDECTOMY  2012    SEPTOPLASTY  1980's   vocal cord surgery   1990's   polyp removal    Review of systems negative except as noted in HPI / PMHx or noted below:  Review of Systems  Constitutional: Negative.   HENT: Negative.    Eyes: Negative.   Respiratory: Negative.    Cardiovascular: Negative.   Gastrointestinal: Negative.   Genitourinary: Negative.   Musculoskeletal: Negative.   Skin: Negative.   Neurological: Negative.   Endo/Heme/Allergies: Negative.   Psychiatric/Behavioral: Negative.       Objective:   Vitals:   01/02/24 1614  BP: (!) 100/50  Pulse: 74  Temp: 98 F (36.7 C)  SpO2: 98%   Height: 5' 7 (170.2 cm)  Weight: 118 lb (53.5 kg)  Physical Exam Constitutional:      Appearance: She is not diaphoretic.  HENT:     Head: Normocephalic.     Right Ear: Tympanic membrane, ear canal and external ear normal.     Left Ear: Tympanic membrane, ear canal and external ear normal.     Nose: Nose normal. No mucosal edema or rhinorrhea.     Mouth/Throat:     Pharynx: Uvula midline. No oropharyngeal exudate.  Eyes:     Conjunctiva/sclera: Conjunctivae normal.  Neck:     Thyroid : No thyromegaly.     Trachea: Trachea normal. No tracheal tenderness or tracheal deviation.  Cardiovascular:     Rate and Rhythm: Normal rate and regular rhythm.     Heart sounds: Normal heart sounds, S1 normal and S2 normal. No murmur heard. Pulmonary:     Effort: No respiratory distress.     Breath sounds: Normal breath sounds. No stridor. No wheezing or rales.  Lymphadenopathy:     Head:     Right side of head: No tonsillar adenopathy.     Left side of head: No tonsillar adenopathy.     Cervical: No cervical adenopathy.  Skin:    Findings: No erythema or rash.     Nails: There is no clubbing.  Neurological:     Mental Status: She is alert.     Diagnostics: Spirometry was performed and demonstrated an FEV1 of 2.28 at 97 % of predicted.   Assessment and Plan:   1. Asthma, well controlled,  mild persistent   2. Nonallergic rhinitis   3. Gastroesophageal reflux disease, unspecified whether esophagitis present   4. Dry eye syndrome of both eyes    1. Do not use oral antihistamine because of dry eye syndrome  2. Treat and prevent inflammation of airway:   A. Arnuity 200 - 1 inhalation 3-7 times per week  B. Nasonex  + Azelastine  - 1 spray each nostril 1-2 times per day  C. Prednisone  10 mg - 1 tablet 1 time per day for 10 days only  3. If needed:   A. Albuterol  - 2 inhalations every 4-6 hours  B. Nasal saline  C. Wetting drops for eyes  D. Famotidine  40 mg - 1 tablet 1 time per day  4. Influenza = Tamiflu. Covid = Paxlovid  5. Return to clinic in 6 months or earlier if problem  I have encouraged Jeroline to use her anti-inflammatory agents for her airway a little more frequently than she presently is using these medications.  She would receive a different affect from these medications if she used a little bit higher dose.  I have given her systemic steroids today at relatively low dose to help with the inflammation that she has at this point in time.  She has a selection of agents that can be utilized if needed as noted above including the introduction of famotidine  if her reflux becomes active.  I encouraged her not to use any oral antihistamines because of her dry eye syndrome.  Camellia Denis, MD Allergy  / Immunology [facility]

## 2024-01-02 NOTE — Patient Instructions (Addendum)
  1. Do not use oral antihistamine because of dry eye syndrome  2. Treat and prevent inflammation of airway:   A. Arnuity 200 - 1 inhalation 3-7 times per week  B. Nasonex  + Azelastine  - 1 spray each nostril 1-2 times per day  C. Prednisone  10 mg - 1 tablet 1 time per day for 10 days only  3. If needed:   A. Albuterol  - 2 inhalations every 4-6 hours  B. Nasal saline  C. Wetting drops for eyes  D. Famotidine  40 mg - 1 tablet 1 time per day  4. Influenza = Tamiflu. Covid = Paxlovid  5. Return to clinic in 6 months or earlier if problem

## 2024-01-03 ENCOUNTER — Encounter: Payer: Self-pay | Admitting: Allergy and Immunology

## 2024-01-03 ENCOUNTER — Telehealth: Payer: Self-pay | Admitting: Allergy and Immunology

## 2024-01-03 MED ORDER — PREDNISONE 10 MG PO TABS: 10.0000 mg | ORAL_TABLET | Freq: Every day | ORAL | 0 refills | Status: AC

## 2024-01-03 NOTE — Telephone Encounter (Signed)
 Prescription sent to patient's pharmacy and patient informed

## 2024-01-03 NOTE — Telephone Encounter (Signed)
 Kristy Garza called and stated that per her visit with Dr. Maurilio yesterday she was to have been prescribed Prednisone , and that it was not sent in. She usues the [facility] in [facility]

## 2024-01-03 NOTE — Addendum Note (Signed)
 Addended by: Felicity Penix on: 01/03/2024 05:46 PM   Modules accepted: Orders

## 2024-01-03 NOTE — Telephone Encounter (Signed)
 Keniesha called back and states her Prednisone  still wasn't called in.  She would like it sent to [facility].

## 2024-01-04 DIAGNOSIS — M62830 Muscle spasm of back: Secondary | ICD-10-CM | POA: Diagnosis not present

## 2024-01-04 DIAGNOSIS — M9903 Segmental and somatic dysfunction of lumbar region: Secondary | ICD-10-CM | POA: Diagnosis not present

## 2024-01-04 DIAGNOSIS — M9902 Segmental and somatic dysfunction of thoracic region: Secondary | ICD-10-CM | POA: Diagnosis not present

## 2024-01-04 DIAGNOSIS — M9901 Segmental and somatic dysfunction of cervical region: Secondary | ICD-10-CM | POA: Diagnosis not present

## 2024-01-05 NOTE — Addendum Note (Signed)
 Addended by: AZALEA, [place] on: 01/05/2024 03:02 PM   Modules accepted: Orders

## 2024-01-11 DIAGNOSIS — M9901 Segmental and somatic dysfunction of cervical region: Secondary | ICD-10-CM | POA: Diagnosis not present

## 2024-01-11 DIAGNOSIS — M9903 Segmental and somatic dysfunction of lumbar region: Secondary | ICD-10-CM | POA: Diagnosis not present

## 2024-01-11 DIAGNOSIS — M62830 Muscle spasm of back: Secondary | ICD-10-CM | POA: Diagnosis not present

## 2024-01-11 DIAGNOSIS — M9902 Segmental and somatic dysfunction of thoracic region: Secondary | ICD-10-CM | POA: Diagnosis not present

## 2024-01-12 ENCOUNTER — Ambulatory Visit (INDEPENDENT_AMBULATORY_CARE_PROVIDER_SITE_OTHER): Admitting: Family Medicine

## 2024-01-12 ENCOUNTER — Encounter: Payer: Self-pay | Admitting: Family Medicine

## 2024-01-12 VITALS — BP 118/72 | HR 81 | Temp 97.6°F | Ht 67.0 in | Wt 117.2 lb

## 2024-01-12 DIAGNOSIS — R6889 Other general symptoms and signs: Secondary | ICD-10-CM | POA: Diagnosis not present

## 2024-01-12 LAB — VERITOR FLU A/B WAIVED
Influenza A: NEGATIVE
Influenza B: NEGATIVE

## 2024-01-12 MED ORDER — BENZONATATE 100 MG PO CAPS: 100.0000 mg | ORAL_CAPSULE | Freq: Three times a day (TID) | ORAL | 0 refills | Status: DC | PRN

## 2024-01-12 NOTE — Progress Notes (Signed)
 Subjective: CC: flu like symptoms PCP: Jolinda Norene HERO, DO YEP:Fjmuyj S Vastine is a 73 y.o. female presenting to clinic today for:  Ongoing for about 2 weeks. Started on prednisone  by her allergist.  Had an urgent care visit and they thought she had a sinus infection so she was put on amoxicillin  875 mg twice daily.  She reports no fevers but has been feeling flushed.  She reports sinus headache, stuffiness.  She has been doing nasal rinses but they are not really helping.  Utilize some Vicks on her chest and feet and that did seem to help slightly.  No hemoptysis.   ROS: Per HPI  Allergies  Allergen Reactions   Cefuroxime  Axetil Other (See Comments)    Extreme gas   Gabapentin  Other (See Comments)    Constipation Constipation   Montelukast  Other (See Comments)    Numbness and tingling in hand. Numbness and tingling in hand. Numbness and tingling in hand. NUMBNESS OF HANDS AND FEET. Numbness in hands and feet NUMBNESS OF HANDS AND FEET.   Pregabalin Other (See Comments)    Drowsiness, dry mouth  Makes her tired and thirsty   Augmentin  [Amoxicillin -Pot Clavulanate] Diarrhea   Avelox [Moxifloxacin Hcl In Nacl] Other (See Comments)    Tremors    Biaxin  [Clarithromycin ]     Unsure of reaction   Cedax [Ceftibuten] Other (See Comments)    tremors   Ciprofloxacin  Other (See Comments) and Nausea And Vomiting    Blurred vision Blurred vision Pt can't remember reaction   Clindamycin/Lincomycin     tachycardia   Doxycycline  Diarrhea    Nausea , stomach upset   Levofloxacin Other (See Comments)    Feels dehydrated   Lincomycin Other (See Comments)    tachycardia   Montelukast  Sodium Other (See Comments)    Numbness and tingling in hand. Numbness and tingling in hand.   Sulfonamide Derivatives Other (See Comments)    Gi upset   Sulfa Antibiotics Nausea Only and Other (See Comments)    Other reaction(s): Other (See Comments)  Gi upset  Other reaction(s): Unknown    Past Medical History:  Diagnosis Date   Abdominal pain 01/16/2013   Acid reflux    Allergy     Arthritis    ARTHRITIS IN NECK BY DR. MERILEE ISSAC   Asthma    Fibroids    Interstitial cystitis    Osteoporosis    PONV (postoperative nausea and vomiting)    Tinnitus    Trigeminal neuralgia    Atypical trigeminal neuralgia    Current Outpatient Medications:    acetaminophen  (TYLENOL ) 500 MG tablet, Take by mouth., Disp: , Rfl:    albuterol  (VENTOLIN  HFA) 108 (90 Base) MCG/ACT inhaler, TAKE 2 PUFFS BY MOUTH EVERY 6 HOURS AS NEEDED FOR WHEEZE OR SHORTNESS OF BREATH, Disp: 18 each, Rfl: 0   amoxicillin  (AMOXIL ) 875 MG tablet, Take 875 mg by mouth 2 (two) times daily., Disp: , Rfl:    ARNUITY ELLIPTA  200 MCG/ACT AEPB, TAKE 1 PUFF BY MOUTH EVERY DAY (Patient taking differently: As needed), Disp: 30 each, Rfl: 5   Aspirin-Acetaminophen -Caffeine  (EXCEDRIN EXTRA STRENGTH PO), Take by mouth., Disp: , Rfl:    azelastine  (ASTELIN ) 0.1 % nasal spray, Place 2 sprays into both nostrils 2 (two) times daily. Use in each nostril as directed, Disp: 30 mL, Rfl: 12   b complex vitamins capsule, Take 1 capsule by mouth daily., Disp: , Rfl:    Cholecalciferol (VITAMIN D3) 125 MCG (5000 UT) CAPS, Take  5,000 Units by mouth daily., Disp: , Rfl:    clobetasol  cream (TEMOVATE ) 0.05 %, Apply 1 Application topically at bedtime. X6 weeks. Then apply to vulva only twice weekly thereafter., Disp: 60 g, Rfl: 2   diphenhydrAMINE  HCl (BENADRYL  PO), Take by mouth., Disp: , Rfl:    Docusate Sodium  (COLACE PO), Take by mouth., Disp: , Rfl:    estradiol  (ESTRACE  VAGINAL) 0.1 MG/GM vaginal cream, Apply 0.5 grams vaginally 3 days per week., Disp: 42.5 g, Rfl: 12   ezetimibe  (ZETIA ) 10 MG tablet, Take 1 tablet (10 mg total) by mouth daily., Disp: 90 tablet, Rfl: 3   famotidine  (PEPCID ) 20 MG tablet, Take 1 tablet (20 mg total) by mouth 2 (two) times daily as needed for heartburn or indigestion., Disp: 180 tablet, Rfl: 1    guaiFENesin  (MUCINEX  PO), Take by mouth., Disp: , Rfl:    Lidocaine -Collagen-Aloe Vera (REGENECARE) 2 % GEL, Apply topically daily., Disp: 85 g, Rfl: 2   Magnesium Hydroxide (MILK OF MAGNESIA PO), Take by mouth., Disp: , Rfl:    MAGNESIUM PO, Take by mouth., Disp: , Rfl:    melatonin 5 MG TABS, Take 5 mg by mouth., Disp: , Rfl:    mometasone  (NASONEX ) 50 MCG/ACT nasal spray, Place 2 sprays into the nose daily., Disp: 17 g, Rfl: 12   ondansetron  (ZOFRAN ) 4 MG tablet, Take 1 tablet (4 mg total) by mouth 3 (three) times daily as needed for nausea or vomiting., Disp: 20 tablet, Rfl: 1   predniSONE  (DELTASONE ) 10 MG tablet, Take 1 tablet (10 mg total) by mouth daily for 10 doses., Disp: 10 tablet, Rfl: 0   Probiotic Product (PROBIOTIC PO), Take by mouth daily., Disp: , Rfl:    ROPINIROLE  HCL PO, Take by mouth., Disp: , Rfl:    Simethicone (GAS-X PO), Take by mouth., Disp: , Rfl:    tizanidine  (ZANAFLEX ) 2 MG capsule, Take 1-2 capsules (2-4 mg total) by mouth 3 (three) times daily as needed for muscle spasms., Disp: 60 capsule, Rfl: 2   traMADol  (ULTRAM ) 50 MG tablet, Take 1 tablet (50 mg total) by mouth every 12 (twelve) hours as needed., Disp: 30 tablet, Rfl: 1   triamcinolone  cream (KENALOG ) 0.1 %, Apply to affected areas twice daily as needed for rash for up to 10 days., Disp: 30 g, Rfl: 0 Social History   Socioeconomic History   Marital status: Married    Spouse name: [name]   Number of children: 3   Years of education: 12   Highest education level: Some college, no degree  Occupational History   Occupation: CNA    Comment: Retired  Tobacco Use   Smoking status: Never   Smokeless tobacco: Never  Vaping Use   Vaping status: Never Used  Substance and Sexual Activity   Alcohol use: No    Alcohol/week: 0.0 standard drinks of alcohol    Comment: 01-19-2016 per pt no   Drug use: No    Comment: 01-19-2016 per pt no    Sexual activity: Not Currently    Birth control/protection: Surgical     Comment: hyst  Other Topics Concern   Not on file  Social History Narrative   Lives with husband.    Social Drivers of Health   Financial Resource Strain: Low Risk  (11/14/2023)   Overall Financial Resource Strain (CARDIA)    Difficulty of Paying Living Expenses: Not hard at all  Food Insecurity: No Food Insecurity (11/14/2023)   Hunger Vital Sign    Worried  About Running Out of Food in the Last Year: Never true    Ran Out of Food in the Last Year: Never true  Transportation Needs: No Transportation Needs (11/14/2023)   PRAPARE - Administrator, Civil Service (Medical): No    Lack of Transportation (Non-Medical): No  Physical Activity: Inactive (11/14/2023)   Exercise Vital Sign    Days of Exercise per Week: 0 days    Minutes of Exercise per Session: 0 min  Stress: No Stress Concern Present (11/14/2023)   [employer] of Occupational Health - Occupational Stress Questionnaire    Feeling of Stress: Only a little  Social Connections: Moderately Integrated (11/14/2023)   Social Connection and Isolation Panel    Frequency of Communication with Friends and Family: Three times a week    Frequency of Social Gatherings with Friends and Family: Once a week    Attends Religious Services: 1 to 4 times per year    Active Member of [employer] or Organizations: No    Attends Banker Meetings: Never    Marital Status: Married  Catering manager Violence: Not At Risk (11/14/2023)   Humiliation, Afraid, Rape, and Kick questionnaire    Fear of Current or Ex-Partner: No    Emotionally Abused: No    Physically Abused: No    Sexually Abused: No   Family History  Problem Relation Age of Onset   Hyperlipidemia Mother    Arthritis Mother    Cancer Mother    Lymphoma Mother    Cancer Father    Allergies Father    Allergies Sister    Allergies Sister    Allergic rhinitis Neg Hx    Angioedema Neg Hx    Asthma Neg Hx    Eczema Neg Hx    Immunodeficiency Neg Hx     Urticaria Neg Hx     Objective: Office vital signs reviewed. BP 118/72   Pulse 81   Temp 97.6 F (36.4 C) (Temporal)   Ht 5' 7 (1.702 m)   Wt 117 lb 3.2 oz (53.2 kg)   SpO2 98%   BMI 18.36 kg/m   Physical Examination:  General: Awake, alert, underweight female that is nontoxic in appearance, No acute distress HEENT: Normal    Neck: No masses palpated.  Minimal anterior cervical lymph node enlargement.  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  Airway is patent Cardio: regular rate and rhythm, S1S2 heard, no murmurs appreciated Pulm: clear to auscultation bilaterally, no wheezes, rhonchi or rales; normal work of breathing on room air Assessment/ Plan: 73 y.o. female   Flu-like symptoms - Plan: Veritor Flu A/B Waived, Novel Coronavirus, NAA ([facility]), benzonatate  (TESSALON  PERLES) 100 MG capsule   Rapid flu and COVID ordered.  Tessalon  Perles sent for cough.  Okay continue oral antibiotic but no evidence of bacterial infection on exam.  Okay to use Afrin for up to 3 days.   Norene CHRISTELLA Fielding, DO [facility] [phone]

## 2024-01-13 LAB — NOVEL CORONAVIRUS, NAA: SARS-CoV-2, NAA: NOT DETECTED

## 2024-01-17 ENCOUNTER — Ambulatory Visit: Payer: Self-pay | Admitting: Family Medicine

## 2024-01-23 ENCOUNTER — Ambulatory Visit (INDEPENDENT_AMBULATORY_CARE_PROVIDER_SITE_OTHER)

## 2024-01-23 VITALS — BP 118/72 | HR 81 | Ht 67.0 in | Wt 117.0 lb

## 2024-01-23 DIAGNOSIS — Z0000 Encounter for general adult medical examination without abnormal findings: Secondary | ICD-10-CM

## 2024-01-23 NOTE — Progress Notes (Signed)
 Subjective:   Kristy Garza is a 73 y.o. who presents for a Medicare Wellness preventive visit.  As a reminder, Annual Wellness Visits don't include a physical exam, and some assessments may be limited, especially if this visit is performed virtually. We may recommend an in-person follow-up visit with your provider if needed.  Visit Complete: Virtual I connected with  [place] Kristy Garza on 01/23/24 by a video and audio enabled telemedicine application and verified that I am speaking with the correct person using two identifiers.  Patient Location: Home  Provider Location: Home Office  I discussed the limitations of evaluation and management by telemedicine. The patient expressed understanding and agreed to proceed.  Vital Signs: Because this visit was a virtual/telehealth visit, some criteria may be missing or patient reported. Any vitals not documented were not able to be obtained and vitals that have been documented are patient reported.   Persons Participating in Visit: Patient.  AWV Questionnaire: No: Patient Medicare AWV questionnaire was not completed prior to this visit.  Cardiac Risk Factors include: advanced age (>30men, >37 women);dyslipidemia     Objective:    Today's Vitals   01/23/24 0902  BP: 118/72  Pulse: 81  Weight: 117 lb (53.1 kg)  Height: 5' 7 (1.702 m)   Body mass index is 18.32 kg/m.     01/23/2024    8:36 AM 03/24/2023   11:12 AM 07/14/2022    2:49 PM 03/22/2022    2:49 PM 08/26/2021    1:36 PM 12/02/2020    9:43 AM 09/13/2019   10:13 AM  Advanced Directives  Does Patient Have a Medical Advance Directive? No No No No No No No  Would patient like information on creating a medical advance directive?  No - Patient declined No - Patient declined No - Patient declined  No - Patient declined No - Patient declined    Current Medications (verified) Outpatient Encounter Medications as of 01/23/2024  Medication Sig   acetaminophen  (TYLENOL ) 500 MG tablet  Take by mouth.   albuterol  (VENTOLIN  HFA) 108 (90 Base) MCG/ACT inhaler TAKE 2 PUFFS BY MOUTH EVERY 6 HOURS AS NEEDED FOR WHEEZE OR SHORTNESS OF BREATH   amoxicillin  (AMOXIL ) 875 MG tablet Take 875 mg by mouth 2 (two) times daily.   ARNUITY ELLIPTA  200 MCG/ACT AEPB TAKE 1 PUFF BY MOUTH EVERY DAY (Patient taking differently: As needed)   Aspirin-Acetaminophen -Caffeine  (EXCEDRIN EXTRA STRENGTH PO) Take by mouth.   azelastine  (ASTELIN ) 0.1 % nasal spray Place 2 sprays into both nostrils 2 (two) times daily. Use in each nostril as directed   b complex vitamins capsule Take 1 capsule by mouth daily.   benzonatate  (TESSALON  PERLES) 100 MG capsule Take 1 capsule (100 mg total) by mouth 3 (three) times daily as needed.   Cholecalciferol (VITAMIN D3) 125 MCG (5000 UT) CAPS Take 5,000 Units by mouth daily.   clobetasol  cream (TEMOVATE ) 0.05 % Apply 1 Application topically at bedtime. X6 weeks. Then apply to vulva only twice weekly thereafter.   diphenhydrAMINE  HCl (BENADRYL  PO) Take by mouth.   Docusate Sodium  (COLACE PO) Take by mouth.   estradiol  (ESTRACE  VAGINAL) 0.1 MG/GM vaginal cream Apply 0.5 grams vaginally 3 days per week.   ezetimibe  (ZETIA ) 10 MG tablet Take 1 tablet (10 mg total) by mouth daily.   famotidine  (PEPCID ) 20 MG tablet Take 1 tablet (20 mg total) by mouth 2 (two) times daily as needed for heartburn or indigestion.   guaiFENesin  (MUCINEX  PO) Take by mouth.  Lidocaine -Collagen-Aloe Vera (REGENECARE) 2 % GEL Apply topically daily.   Magnesium Hydroxide (MILK OF MAGNESIA PO) Take by mouth.   MAGNESIUM PO Take by mouth.   melatonin 5 MG TABS Take 5 mg by mouth.   mometasone  (NASONEX ) 50 MCG/ACT nasal spray Place 2 sprays into the nose daily.   ondansetron  (ZOFRAN ) 4 MG tablet Take 1 tablet (4 mg total) by mouth 3 (three) times daily as needed for nausea or vomiting.   Probiotic Product (PROBIOTIC PO) Take by mouth daily.   ROPINIROLE  HCL PO Take by mouth.   Simethicone (GAS-X PO)  Take by mouth.   tizanidine  (ZANAFLEX ) 2 MG capsule Take 1-2 capsules (2-4 mg total) by mouth 3 (three) times daily as needed for muscle spasms.   traMADol  (ULTRAM ) 50 MG tablet Take 1 tablet (50 mg total) by mouth every 12 (twelve) hours as needed.   triamcinolone  cream (KENALOG ) 0.1 % Apply to affected areas twice daily as needed for rash for up to 10 days.   No facility-administered encounter medications on file as of 01/23/2024.    Allergies (verified) Cefuroxime  axetil, Gabapentin , Montelukast , Pregabalin, Augmentin  [amoxicillin -pot clavulanate], Avelox [moxifloxacin hcl in nacl], Biaxin  [clarithromycin ], Cedax [ceftibuten], Ciprofloxacin , Clindamycin/lincomycin, Doxycycline , Levofloxacin, Lincomycin, Montelukast  sodium, Sulfonamide derivatives, and Sulfa antibiotics   History: Past Medical History:  Diagnosis Date   Abdominal pain 01/16/2013   Acid reflux    Allergy     Arthritis    ARTHRITIS IN NECK BY DR. MERILEE ISSAC   Asthma    Fibroids    Interstitial cystitis    Osteoporosis    PONV (postoperative nausea and vomiting)    Tinnitus    Trigeminal neuralgia    Atypical trigeminal neuralgia   Past Surgical History:  Procedure Laterality Date   ABDOMINAL HYSTERECTOMY  2000   CYSTOSCOPY WITH HYDRODISTENSION AND BIOPSY N/A 06/11/2012   Procedure: CYSTOSCOPY/BIOPSY/HYDRODISTENSION with Instillation of Pyridium  and Marcaine  and Kenalog ;  Surgeon: Arlena LILLETTE Gal, MD;  Location: [facility] [place];  Service: Urology;  Laterality: N/A;  1 hour requested for this case  BLADDER BIOPSY   ETHMOIDECTOMY  2012   SEPTOPLASTY  1980's   vocal cord surgery   1990's   polyp removal   Family History  Problem Relation Age of Onset   Hyperlipidemia Mother    Arthritis Mother    Cancer Mother    Lymphoma Mother    Cancer Father    Allergies Father    Allergies Sister    Allergies Sister    Allergic rhinitis Neg Hx    Angioedema Neg Hx    Asthma Neg Hx    Eczema Neg  Hx    Immunodeficiency Neg Hx    Urticaria Neg Hx    Social History   Socioeconomic History   Marital status: Married    Spouse name: [name]   Number of children: 3   Years of education: 12   Highest education level: Some college, no degree  Occupational History   Occupation: CNA    Comment: Retired  Tobacco Use   Smoking status: Never   Smokeless tobacco: Never  Vaping Use   Vaping status: Never Used  Substance and Sexual Activity   Alcohol use: No    Alcohol/week: 0.0 standard drinks of alcohol    Comment: 01-19-2016 per pt no   Drug use: No    Comment: 01-19-2016 per pt no    Sexual activity: Not Currently    Birth control/protection: Surgical    Comment: hyst  Other  Topics Concern   Not on file  Social History Narrative   Lives with husband.    Social Drivers of Corporate Investment Banker Strain: Low Risk  (01/23/2024)   Overall Financial Resource Strain (CARDIA)    Difficulty of Paying Living Expenses: Not hard at all  Food Insecurity: No Food Insecurity (01/23/2024)   Hunger Vital Sign    Worried About Running Out of Food in the Last Year: Never true    Ran Out of Food in the Last Year: Never true  Transportation Needs: No Transportation Needs (01/23/2024)   PRAPARE - Administrator, Civil Service (Medical): No    Lack of Transportation (Non-Medical): No  Physical Activity: Inactive (01/23/2024)   Exercise Vital Sign    Days of Exercise per Week: 0 days    Minutes of Exercise per Session: 0 min  Stress: No Stress Concern Present (01/23/2024)   [employer] of Occupational Health - Occupational Stress Questionnaire    Feeling of Stress: Only a little  Social Connections: Moderately Isolated (01/23/2024)   Social Connection and Isolation Panel    Frequency of Communication with Friends and Family: Twice a week    Frequency of Social Gatherings with Friends and Family: Twice a week    Attends Religious Services: Never    Doctor, General Practice or Organizations: No    Attends Engineer, Structural: Never    Marital Status: Married    Tobacco Counseling Counseling given: Yes    Clinical Intake:  Pre-visit preparation completed: Yes  Pain : No/denies pain     BMI - recorded: 18.32 Nutritional Status: BMI <19  Underweight Nutritional Risks: None Diabetes: No  Lab Results  Component Value Date   HGBA1C 5.1 06/15/2020     How often do you need to have someone help you when you read instructions, pamphlets, or other written materials from your doctor or pharmacy?: 1 - Never  Interpreter Needed?: No  Information entered by :: alia t/cma   Activities of Daily Living     01/23/2024    8:35 AM  In your present state of health, do you have any difficulty performing the following activities:  Hearing? 0  Vision? 0  Difficulty concentrating or making decisions? 0  Walking or climbing stairs? 0  Dressing or bathing? 0  Doing errands, shopping? 0  Preparing Food and eating ? N  Using the Toilet? N  In the past six months, have you accidently leaked urine? Y  Do you have problems with loss of bowel control? N  Managing your Medications? N  Managing your Finances? N  Housekeeping or managing your Housekeeping? N    Patient Care Team: Jolinda Norene HERO, DO as PCP - General (Family Medicine) Neysa Reggy BIRCH, MD as Consulting Physician (Pulmonary Disease) Lavona Agent, MD as Consulting Physician (Cardiology) Frances Ozell RAMAN, LCSW as Social Worker (Licensed Clinical Social Worker) Zulema Gear, MD as Referring Physician (Gastroenterology)  I have updated your Care Teams any recent Medical Services you may have received from other providers in the past year.     Assessment:   This is a routine wellness examination for Calinda.  Hearing/Vision screen Hearing Screening - Comments:: Pt denies hearing dif Vision Screening - Comments:: Pt wear glasses/pt goes to [facility] in [place], [place]/last  ov 2024   Goals Addressed   None    Depression Screen     01/23/2024    8:44 AM 01/12/2024   12:23 PM 11/14/2023  12:15 PM 10/18/2023   10:25 AM 08/29/2023    8:54 AM 07/27/2023   11:14 AM 05/08/2023    8:21 AM  PHQ 2/9 Scores  PHQ - 2 Score 0 0 1 0 0 0 0  PHQ- 9 Score 1 2 3  0 0 2 0    Fall Risk     01/23/2024    8:34 AM 01/12/2024   12:17 PM 10/18/2023   10:34 AM 08/29/2023    8:54 AM 05/08/2023    8:31 AM  Fall Risk   Falls in the past year? 1 1 0 0 0  Number falls in past yr: 1  0 0 0  Injury with Fall? 0  0 0 0  Risk for fall due to : Impaired balance/gait;Impaired mobility  No Fall Risks No Fall Risks No Fall Risks  Follow up Falls evaluation completed;Education provided  Falls evaluation completed Falls evaluation completed Education provided    MEDICARE RISK AT HOME:  Medicare Risk at Home Any stairs in or around the home?: Yes If so, are there any without handrails?: Yes Home free of loose throw rugs in walkways, pet beds, electrical cords, etc?: Yes Adequate lighting in your home to reduce risk of falls?: Yes Life alert?: No Use of a cane, walker or w/c?: No Grab bars in the bathroom?: No Shower chair or bench in shower?: No Elevated toilet seat or a handicapped toilet?: No  TIMED UP AND GO:  Was the test performed?  no  Cognitive Function: 6CIT completed    09/07/2017    2:50 PM 07/27/2015    4:33 PM 07/11/2014   11:59 AM  MMSE - Mini Mental State Exam  Orientation to time 5 5  5    Orientation to Place 5 5  5    Registration 3 3  3    Attention/ Calculation 5 4  5    Recall 3 3  2    Language- name 2 objects 2 2  2    Language- repeat 1 1 1   Language- follow 3 step command 3 3  3    Language- read & follow direction 1 1  1    Write a sentence 1 1  1    Copy design 1 1  1    Total score 30 29  29       Data saved with a previous flowsheet row definition        01/23/2024    8:44 AM 07/14/2022    2:50 PM 12/02/2020    9:44 AM 09/13/2019   10:14 AM 09/10/2018    10:48 AM  6CIT Screen  What Year? 0 points 0 points 0 points 0 points 0 points  What month? 0 points 0 points 0 points 0 points 0 points  What time? 0 points 0 points 0 points 0 points 0 points  Count back from 20 0 points 0 points 0 points 0 points 0 points  Months in reverse 0 points 0 points 0 points 0 points 0 points  Repeat phrase 0 points 0 points 0 points 0 points 0 points  Total Score 0 points 0 points 0 points 0 points 0 points    Immunizations Immunization History  Administered Date(s) Administered   Fluad Quad(high Dose 65+) 02/12/2021   Fluad Trivalent(High Dose 65+) 12/28/2022   Influenza,inj,Quad PF,6+ Mos 12/28/2012, 12/25/2013, 12/26/2014   PFIZER(Purple Top)SARS-COV-2 Vaccination 01/01/2020, 02/05/2020   Pneumococcal Conjugate-13 02/07/2013   Pneumococcal Polysaccharide-23 09/07/2017   Tdap 05/21/2010   Zoster, Live 03/27/2012    Screening Tests Health  Maintenance  Topic Date Due   Zoster Vaccines- Shingrix (1 of 2) 11/18/1969   COVID-19 Vaccine (3 - Pfizer risk series) 03/04/2020   DTaP/Tdap/Td (2 - Td or Tdap) 05/07/2024 (Originally 05/21/2020)   Influenza Vaccine  06/25/2024 (Originally 10/27/2023)   Mammogram  11/21/2024   DEXA SCAN  12/28/2024   Medicare Annual Wellness (AWV)  01/22/2025   Colonoscopy  06/28/2025   Pneumococcal Vaccine: 50+ Years  Completed   Hepatitis C Screening  Completed   Meningococcal B Vaccine  Aged Out    Health Maintenance Items Addressed: See Nurse Notes at the end of this note  Additional Screening:  Vision Screening: Recommended annual ophthalmology exams for early detection of glaucoma and other disorders of the eye. Is the patient up to date with their annual eye exam?  Yes  Who is the provider or what is the name of the office in which the patient attends annual eye exams? [facility] in [facility]  Dental Screening: Recommended annual dental exams for proper oral hygiene  Community Resource Referral / Chronic Care  Management: CRR required this visit?  No   CCM required this visit?  No   Plan:    I have personally reviewed and noted the following in the patient's chart:   Medical and social history Use of alcohol, tobacco or illicit drugs  Current medications and supplements including opioid prescriptions. Patient is not currently taking opioid prescriptions. Functional ability and status Nutritional status Physical activity Advanced directives List of other physicians Hospitalizations, surgeries, and ER visits in previous 12 months Vitals Screenings to include cognitive, depression, and falls Referrals and appointments  In addition, I have reviewed and discussed with patient certain preventive protocols, quality metrics, and best practice recommendations. A written personalized care plan for preventive services as well as general preventive health recommendations were provided to patient.   Ozie Ned, CMA   01/23/2024   After Visit Summary: (MyChart) Due to this being a telephonic visit, the after visit summary with patients personalized plan was offered to patient via MyChart   Notes: Nothing significant to report at this time.

## 2024-01-23 NOTE — Patient Instructions (Signed)
 Kristy Garza,  Thank you for taking the time for your Medicare Wellness Visit. I appreciate your continued commitment to your health goals. Please review the care plan we discussed, and feel free to reach out if I can assist you further.  Medicare recommends these wellness visits once per year to help you and your care team stay ahead of potential health issues. These visits are designed to focus on prevention, allowing your provider to concentrate on managing your acute and chronic conditions during your regular appointments.  Please note that Annual Wellness Visits do not include a physical exam. Some assessments may be limited, especially if the visit was conducted virtually. If needed, we may recommend a separate in-person follow-up with your provider.  Ongoing Care Seeing your primary care provider every 3 to 6 months helps us  monitor your health and provide consistent, personalized care.   Referrals If a referral was made during today's visit and you haven't received any updates within two weeks, please contact the referred provider directly to check on the status.  Recommended Screenings:  Health Maintenance  Topic Date Due   Zoster (Shingles) Vaccine (1 of 2) 11/18/1969   COVID-19 Vaccine (3 - Pfizer risk series) 03/04/2020   Medicare Annual Wellness Visit  07/14/2023   DTaP/Tdap/Td vaccine (2 - Td or Tdap) 05/07/2024*   Flu Shot  06/25/2024*   Breast Cancer Screening  11/21/2024   DEXA scan (bone density measurement)  12/28/2024   Colon Cancer Screening  06/28/2025   Pneumococcal Vaccine for age over 60  Completed   Hepatitis C Screening  Completed   Meningitis B Vaccine  Aged Out  *Topic was postponed. The date shown is not the original due date.       01/23/2024    8:36 AM  Advanced Directives  Does Patient Have a Medical Advance Directive? No   Advance Care Planning is important because it: Ensures you receive medical care that aligns with your values, goals, and  preferences. Provides guidance to your family and loved ones, reducing the emotional burden of decision-making during critical moments.  Vision: Annual vision screenings are recommended for early detection of glaucoma, cataracts, and diabetic retinopathy. These exams can also reveal signs of chronic conditions such as diabetes and high blood pressure.  Dental: Annual dental screenings help detect early signs of oral cancer, gum disease, and other conditions linked to overall health, including heart disease and diabetes.  Please see the attached documents for additional preventive care recommendations.

## 2024-02-05 ENCOUNTER — Telehealth: Payer: Self-pay | Admitting: Family Medicine

## 2024-02-05 DIAGNOSIS — M810 Age-related osteoporosis without current pathological fracture: Secondary | ICD-10-CM

## 2024-02-05 NOTE — Telephone Encounter (Signed)
 Patient has reclast  order in for osteoporosis. Please have her come in for BMP and Vit d level prior to getting infusion.  Orders are placed.  Needs lab appt ASAP.  Orders Placed This Encounter  Procedures   Vitamin D , 25-hydroxy   Basic Metabolic Panel

## 2024-02-05 NOTE — Telephone Encounter (Signed)
 Appointment scheduled for lab work, patient would like to get blood work done for physical to.

## 2024-02-06 ENCOUNTER — Telehealth: Payer: Self-pay

## 2024-02-06 ENCOUNTER — Other Ambulatory Visit (HOSPITAL_COMMUNITY): Payer: Self-pay | Admitting: Family Medicine

## 2024-02-06 DIAGNOSIS — M810 Age-related osteoporosis without current pathological fracture: Secondary | ICD-10-CM | POA: Insufficient documentation

## 2024-02-06 NOTE — Telephone Encounter (Signed)
 Auth Submission: NO AUTH NEEDED Site of care: Site of care: [facility] Payer: [employer] medicare Medication & CPT/J Code(s) submitted: Reclast  (Zolendronic acid) [id number] Diagnosis Code:  Route of submission (phone, fax, portal): portal Phone # Fax # Auth type: Buy/Bill PB Units/visits requested: 5mg  x 1 dose Reference number:  Approval from: 02/06/24 to 03/27/24

## 2024-02-06 NOTE — Telephone Encounter (Signed)
 She had full panels (except vit d) done in June so not sure exactly what she is looking to have done?

## 2024-02-06 NOTE — Telephone Encounter (Signed)
Pt notified.    LS

## 2024-02-14 ENCOUNTER — Other Ambulatory Visit

## 2024-02-16 ENCOUNTER — Other Ambulatory Visit

## 2024-02-16 DIAGNOSIS — M810 Age-related osteoporosis without current pathological fracture: Secondary | ICD-10-CM

## 2024-02-17 LAB — BASIC METABOLIC PANEL WITH GFR
BUN/Creatinine Ratio: 12 (ref 12–28)
BUN: 10 mg/dL (ref 8–27)
CO2: 25 mmol/L (ref 20–29)
Calcium: 9.9 mg/dL (ref 8.7–10.3)
Chloride: 102 mmol/L (ref 96–106)
Creatinine, Ser: 0.85 mg/dL (ref 0.57–1.00)
Glucose: 85 mg/dL (ref 70–99)
Potassium: 4.6 mmol/L (ref 3.5–5.2)
Sodium: 138 mmol/L (ref 134–144)
eGFR: 72 mL/min/1.73 (ref >59)

## 2024-02-17 LAB — VITAMIN D 25 HYDROXY (VIT D DEFICIENCY, FRACTURES): Vit D, 25-Hydroxy: 81.5 ng/mL (ref 30.0–100.0)

## 2024-02-19 ENCOUNTER — Ambulatory Visit: Payer: Self-pay | Admitting: Family Medicine

## 2024-02-21 ENCOUNTER — Encounter: Payer: Self-pay | Admitting: Family Medicine

## 2024-02-21 ENCOUNTER — Ambulatory Visit (INDEPENDENT_AMBULATORY_CARE_PROVIDER_SITE_OTHER): Payer: Self-pay | Admitting: Family Medicine

## 2024-02-21 VITALS — BP 109/69 | HR 90 | Temp 98.3°F | Ht 68.0 in | Wt 118.0 lb

## 2024-02-21 DIAGNOSIS — Z681 Body mass index (BMI) 19 or less, adult: Secondary | ICD-10-CM | POA: Diagnosis not present

## 2024-02-21 DIAGNOSIS — E440 Moderate protein-calorie malnutrition: Secondary | ICD-10-CM | POA: Diagnosis not present

## 2024-02-21 DIAGNOSIS — Z0001 Encounter for general adult medical examination with abnormal findings: Secondary | ICD-10-CM

## 2024-02-21 DIAGNOSIS — E7800 Pure hypercholesterolemia, unspecified: Secondary | ICD-10-CM

## 2024-02-21 DIAGNOSIS — Z8249 Family history of ischemic heart disease and other diseases of the circulatory system: Secondary | ICD-10-CM

## 2024-02-21 DIAGNOSIS — M810 Age-related osteoporosis without current pathological fracture: Secondary | ICD-10-CM | POA: Diagnosis not present

## 2024-02-21 DIAGNOSIS — Z0000 Encounter for general adult medical examination without abnormal findings: Secondary | ICD-10-CM

## 2024-02-21 MED ORDER — ROSUVASTATIN CALCIUM 5 MG PO TABS
5.0000 mg | ORAL_TABLET | Freq: Every day | ORAL | 3 refills | Status: AC
Start: 2024-02-21 — End: ?

## 2024-02-21 NOTE — Patient Instructions (Signed)
 Resume rosuvastatin  If cannot tolerate it Red Rice Yeast for cholesterol

## 2024-02-21 NOTE — Progress Notes (Signed)
 Kristy Garza is a 73 y.o. female presents to office today for annual physical exam examination.    Patient reports that she has been having some abdominal pain.  She has meant to reach out to her gastroenterologist but has not yet done so.  She notes that she is worried about having recurrent parasites because the integrative specialist told her that she had some and she was subsequently treated.  She does not report any gross blood in stool.  She is interested in knowing what she should eat because her cholesterol is high but then goes on to state that she has a very strong family history of heart disease in her sister and her mother, who is now deceased after complications of COVID.  When she took the Zetia , she felt like she had some intermittent sharp right sided chest pain so she discontinued it.  This had replaced the rosuvastatin  which she reported intolerance to just a couple months prior.  However, after she realized that her husband takes rosuvastatin  10 mg she is willing to try it again.  She admits that she does not exercise regularly and does not necessarily eat like she should.  Occupation: retired, Marital status: married, Substance use: none Health Maintenance Due  Topic Date Due   COVID-19 Vaccine (3 - Pfizer risk series) 03/04/2020    Immunization History  Administered Date(s) Administered   Fluad Quad(high Dose 65+) 02/12/2021   Fluad Trivalent(High Dose 65+) 12/28/2022   Influenza,inj,Quad PF,6+ Mos 12/28/2012, 12/25/2013, 12/26/2014   PFIZER(Purple Top)SARS-COV-2 Vaccination 01/01/2020, 02/05/2020   Pneumococcal Conjugate-13 02/07/2013   Pneumococcal Polysaccharide-23 09/07/2017   Tdap 05/21/2010   Zoster, Live 03/27/2012   Past Medical History:  Diagnosis Date   Abdominal pain 01/16/2013   Acid reflux    Allergy     Arthritis    ARTHRITIS IN NECK BY DR. MERILEE ISSAC   Asthma    Fibroids    Interstitial cystitis    Osteoporosis    PONV (postoperative  nausea and vomiting)    Tinnitus    Trigeminal neuralgia    Atypical trigeminal neuralgia   Social History   Socioeconomic History   Marital status: Married    Spouse name: [name]   Number of children: 3   Years of education: 12   Highest education level: Some college, no degree  Occupational History   Occupation: CNA    Comment: Retired  Tobacco Use   Smoking status: Never   Smokeless tobacco: Never  Vaping Use   Vaping status: Never Used  Substance and Sexual Activity   Alcohol use: No    Alcohol/week: 0.0 standard drinks of alcohol    Comment: 01-19-2016 per pt no   Drug use: No    Comment: 01-19-2016 per pt no    Sexual activity: Not Currently    Birth control/protection: Surgical    Comment: hyst  Other Topics Concern   Not on file  Social History Narrative   Lives with husband.    Has 2 daughters and a son.   Both parents have passed away   Sees an integrative specialist occasionally   Social Drivers of Health   Financial Resource Strain: Low Risk  (01/23/2024)   Overall Financial Resource Strain (CARDIA)    Difficulty of Paying Living Expenses: Not hard at all  Food Insecurity: No Food Insecurity (01/23/2024)   Hunger Vital Sign    Worried About Running Out of Food in the Last Year: Never true    Ran Out  of Food in the Last Year: Never true  Transportation Needs: No Transportation Needs (01/23/2024)   PRAPARE - Administrator, Civil Service (Medical): No    Lack of Transportation (Non-Medical): No  Physical Activity: Inactive (01/23/2024)   Exercise Vital Sign    Days of Exercise per Week: 0 days    Minutes of Exercise per Session: 0 min  Stress: No Stress Concern Present (01/23/2024)   [employer] of Occupational Health - Occupational Stress Questionnaire    Feeling of Stress: Only a little  Social Connections: Moderately Isolated (01/23/2024)   Social Connection and Isolation Panel    Frequency of Communication with Friends and  Family: Twice a week    Frequency of Social Gatherings with Friends and Family: Twice a week    Attends Religious Services: Never    Database Administrator or Organizations: No    Attends Banker Meetings: Never    Marital Status: Married  Catering Manager Violence: Not At Risk (01/23/2024)   Humiliation, Afraid, Rape, and Kick questionnaire    Fear of Current or Ex-Partner: No    Emotionally Abused: No    Physically Abused: No    Sexually Abused: No   Past Surgical History:  Procedure Laterality Date   ABDOMINAL HYSTERECTOMY  2000   CYSTOSCOPY WITH HYDRODISTENSION AND BIOPSY N/A 06/11/2012   Procedure: CYSTOSCOPY/BIOPSY/HYDRODISTENSION with Instillation of Pyridium  and Marcaine  and Kenalog ;  Surgeon: Arlena LILLETTE Gal, MD;  Location: [facility];  Service: Urology;  Laterality: N/A;  1 hour requested for this case  BLADDER BIOPSY   ETHMOIDECTOMY  2012   SEPTOPLASTY  1980's   vocal cord surgery   1990's   polyp removal   Family History  Problem Relation Age of Onset   Hyperlipidemia Mother    Arthritis Mother    Cancer Mother    Lymphoma Mother    Cancer Father    Allergies Father    Allergies Sister    Allergies Sister    Allergic rhinitis Neg Hx    Angioedema Neg Hx    Asthma Neg Hx    Eczema Neg Hx    Immunodeficiency Neg Hx    Urticaria Neg Hx     Current Outpatient Medications:    acetaminophen  (TYLENOL ) 500 MG tablet, Take by mouth., Disp: , Rfl:    albuterol  (VENTOLIN  HFA) 108 (90 Base) MCG/ACT inhaler, TAKE 2 PUFFS BY MOUTH EVERY 6 HOURS AS NEEDED FOR WHEEZE OR SHORTNESS OF BREATH, Disp: 18 each, Rfl: 0   ARNUITY ELLIPTA  200 MCG/ACT AEPB, TAKE 1 PUFF BY MOUTH EVERY DAY (Patient taking differently: As needed), Disp: 30 each, Rfl: 5   Aspirin-Acetaminophen -Caffeine  (EXCEDRIN EXTRA STRENGTH PO), Take by mouth., Disp: , Rfl:    azelastine  (ASTELIN ) 0.1 % nasal spray, Place 2 sprays into both nostrils 2 (two) times daily. Use in each  nostril as directed, Disp: 30 mL, Rfl: 12   b complex vitamins capsule, Take 1 capsule by mouth daily., Disp: , Rfl:    Cholecalciferol (VITAMIN D3) 125 MCG (5000 UT) CAPS, Take 5,000 Units by mouth daily., Disp: , Rfl:    clobetasol  cream (TEMOVATE ) 0.05 %, Apply 1 Application topically at bedtime. X6 weeks. Then apply to vulva only twice weekly thereafter., Disp: 60 g, Rfl: 2   diphenhydrAMINE  HCl (BENADRYL  PO), Take by mouth., Disp: , Rfl:    Docusate Sodium  (COLACE PO), Take by mouth., Disp: , Rfl:    estradiol  (ESTRACE  VAGINAL) 0.1 MG/GM vaginal  cream, Apply 0.5 grams vaginally 3 days per week., Disp: 42.5 g, Rfl: 12   famotidine  (PEPCID ) 20 MG tablet, Take 1 tablet (20 mg total) by mouth 2 (two) times daily as needed for heartburn or indigestion., Disp: 180 tablet, Rfl: 1   guaiFENesin  (MUCINEX  PO), Take by mouth., Disp: , Rfl:    Lidocaine -Collagen-Aloe Vera (REGENECARE) 2 % GEL, Apply topically daily., Disp: 85 g, Rfl: 2   Magnesium Hydroxide (MILK OF MAGNESIA PO), Take by mouth., Disp: , Rfl:    MAGNESIUM PO, Take by mouth., Disp: , Rfl:    melatonin 5 MG TABS, Take 5 mg by mouth., Disp: , Rfl:    mometasone  (NASONEX ) 50 MCG/ACT nasal spray, Place 2 sprays into the nose daily., Disp: 17 g, Rfl: 12   ondansetron  (ZOFRAN ) 4 MG tablet, Take 1 tablet (4 mg total) by mouth 3 (three) times daily as needed for nausea or vomiting., Disp: 20 tablet, Rfl: 1   Probiotic Product (PROBIOTIC PO), Take by mouth daily., Disp: , Rfl:    rosuvastatin  (CRESTOR ) 5 MG tablet, Take 1 tablet (5 mg total) by mouth daily. Wants to replace zetia , Disp: 90 tablet, Rfl: 3   Simethicone (GAS-X PO), Take by mouth., Disp: , Rfl:    tizanidine  (ZANAFLEX ) 2 MG capsule, Take 1-2 capsules (2-4 mg total) by mouth 3 (three) times daily as needed for muscle spasms., Disp: 60 capsule, Rfl: 2   traMADol  (ULTRAM ) 50 MG tablet, Take 1 tablet (50 mg total) by mouth every 12 (twelve) hours as needed., Disp: 30 tablet, Rfl: 1    amoxicillin  (AMOXIL ) 875 MG tablet, Take 875 mg by mouth 2 (two) times daily. (Patient not taking: Reported on 02/21/2024), Disp: , Rfl:    benzonatate  (TESSALON  PERLES) 100 MG capsule, Take 1 capsule (100 mg total) by mouth 3 (three) times daily as needed. (Patient not taking: Reported on 02/21/2024), Disp: 20 capsule, Rfl: 0   ezetimibe  (ZETIA ) 10 MG tablet, Take 1 tablet (10 mg total) by mouth daily. (Patient not taking: Reported on 02/21/2024), Disp: 90 tablet, Rfl: 3   ROPINIROLE  HCL PO, Take by mouth. (Patient not taking: Reported on 02/21/2024), Disp: , Rfl:    triamcinolone  cream (KENALOG ) 0.1 %, Apply to affected areas twice daily as needed for rash for up to 10 days. (Patient not taking: Reported on 02/21/2024), Disp: 30 g, Rfl: 0  Allergies  Allergen Reactions   Cefuroxime  Axetil Other (See Comments)    Extreme gas   Gabapentin  Other (See Comments)    Constipation Constipation   Montelukast  Other (See Comments)    Numbness and tingling in hand. Numbness and tingling in hand. Numbness and tingling in hand. NUMBNESS OF HANDS AND FEET. Numbness in hands and feet NUMBNESS OF HANDS AND FEET.   Pregabalin Other (See Comments)    Drowsiness, dry mouth  Makes her tired and thirsty   Augmentin  [Amoxicillin -Pot Clavulanate] Diarrhea   Avelox [Moxifloxacin Hcl In Nacl] Other (See Comments)    Tremors    Biaxin  [Clarithromycin ]     Unsure of reaction   Cedax [Ceftibuten] Other (See Comments)    tremors   Ciprofloxacin  Other (See Comments) and Nausea And Vomiting    Blurred vision Blurred vision Pt can't remember reaction   Clindamycin/Lincomycin     tachycardia   Doxycycline  Diarrhea    Nausea , stomach upset   Levofloxacin Other (See Comments)    Feels dehydrated   Lincomycin Other (See Comments)    tachycardia   Montelukast  Sodium  Other (See Comments)    Numbness and tingling in hand. Numbness and tingling in hand.   Sulfonamide Derivatives Other (See Comments)    Gi  upset   Sulfa Antibiotics Nausea Only and Other (See Comments)    Other reaction(s): Other (See Comments)  Gi upset  Other reaction(s): Unknown     ROS: Review of Systems Pertinent items noted in HPI and remainder of comprehensive ROS otherwise negative.    Physical exam BP 109/69   Pulse 90   Temp 98.3 F (36.8 C)   Ht 5' 8 (1.727 m)   Wt 118 lb (53.5 kg)   SpO2 97%   BMI 17.94 kg/m  General appearance: alert, cooperative, appears older than stated age, no distress, and underweight Head: Normocephalic, without obvious abnormality, atraumatic Eyes: negative findings: lids and lashes normal, conjunctivae and sclerae normal, corneas clear, and pupils equal, round, reactive to light and accomodation Ears: normal TM's and external ear canals both ears Nose: Nares normal. Septum midline. Mucosa normal. No drainage or sinus tenderness. Throat: lips, mucosa, and tongue normal; teeth and gums normal Neck: no adenopathy, no carotid bruit, supple, symmetrical, trachea midline, and thyroid  not enlarged, symmetric, no tenderness/mass/nodules Back: Increased thoracic curvature but able to ambulate independently and lay flat during exam Lungs: clear to auscultation bilaterally Heart: regular rate and rhythm, S1, S2 normal, no murmur, click, rub or gallop Abdomen: soft, non-tender; bowel sounds normal; no masses,  no organomegaly Extremities: extremities normal, atraumatic, no cyanosis or edema Pulses: 2+ and symmetric Skin: Skin color, texture, turgor normal. No rashes or lesions Lymph nodes: Supraclavicular and anterior cervical lymph nodes without enlargement Neurologic: Alert and oriented X 3, normal strength and tone. Normal symmetric reflexes. Normal coordination and gait      02/21/2024    2:16 PM 01/23/2024    8:44 AM 01/12/2024   12:23 PM  Depression screen PHQ 2/9  Decreased Interest 0 0 0  Down, Depressed, Hopeless 0 0 0  PHQ - 2 Score 0 0 0  Altered sleeping 1 1 1    Tired, decreased energy 1 0 1  Change in appetite 0 0 0  Feeling bad or failure about yourself  0 0 0  Trouble concentrating 1 0 0  Moving slowly or fidgety/restless 0 0 0  Suicidal thoughts 0 0 0  PHQ-9 Score 3 1  2    Difficult doing work/chores Somewhat difficult Not difficult at all Somewhat difficult     Data saved with a previous flowsheet row definition      02/21/2024    2:17 PM 01/12/2024   12:24 PM 11/14/2023   12:16 PM 10/18/2023   10:24 AM  GAD 7 : Generalized Anxiety Score  Nervous, Anxious, on Edge 0 0 1 0  Control/stop worrying 0 0 0 0  Worry too much - different things 1 0 0 0  Trouble relaxing 0 0 1 0  Restless 1 0 0 0  Easily annoyed or irritable 1 0 0 0  Afraid - awful might happen 0 0 0 0  Total GAD 7 Score 3 0 2 0  Anxiety Difficulty Somewhat difficult Not difficult at all  Not difficult at all    Recent Results (from the past 2160 hours)  Surgical pathology     Status: None   Collection Time: 12/08/23  9:11 AM  Result Value Ref Range   SURGICAL PATHOLOGY      SURGICAL PATHOLOGY * THIS IS AN ADDENDUM REPORT * CASE: [id number] PATIENT: Mashayla Thornell  Surgical Pathology Report *Addendum *  Reason for Addendum #1:  Additional special stains  Clinical History: vulvar irritation (cf)     FINAL MICROSCOPIC DIAGNOSIS:  A. VULVA, BIOPSY: - Skin with mild spongiosis and pigmented histiocytes in superficial dermis, see comment. - Negative for dysplasia and malignancy. - A PASF stain will be reported in an addendum.  Comment: The histologic findings are non-specific and may represent post-inflammatory response.  Features of lichen sclerosus are not identified.  GROSS DESCRIPTION:  Specimen is received in formalin and consists of a 0.5 cm piece of tan-white soft tissue.  The specimen is entirely submitted in 1 cassette.  (KL 12/08/2023)  Final Diagnosis performed by Rexene Daily, MD.   Electronically signed 12/11/2023 Technical component  performed at [employer]. [facility], [address] [place], [place] [address].  Professional component performed at [facility]. [address] [place], [place] [address]  Immunohistochemistry Technical component (if applicable) was performed at [facility] [employer]. [address], [place], [place] [address].  IMMUNOHISTOCHEMISTRY DISCLAIMER (if applicable): Some of these immunohistochemical stains may have been developed and the performance characteristics determine by [facility]. Some may not have been cleared or approved by the U.S. Food and Drug Administration. The FDA has determined that such clearance or approval is not necessary. This test is used for clinical purposes. It should not be regarded as investigational or for research. This laboratory is certified under the Clinical Laboratory Improvement Amendments of 1988 (CLIA-88) as qualified to perform high complexity clinical laboratory testing.  The controls stained appropriately.  ADDENDUM: A P ASF stain was performed and yeast/fungus was not identified. Stain controls worked appropriately.        Addendum #1 performed by Rexene Daily, MD.   Electronically signed 12/12/2023 Technical component performed at [facility]. [facility], [address] [place], [place] [address].  Professional component performed at [facility]. [address] [place], [place] [address]  Immunohistochemistry Technical component (if applicable) was performed at [facility]. [address], [place], [place] [address].  IMMUNOHISTOCHEMISTRY DISCLAIMER (if applicable): Some of these immunohistochemical stains may have been developed and the performance characteristics determine by [facility]. Some may not have been cleared or approved by the U.S. Food and Drug Administration. The FDA has determined  that such clearance or approval is not necessary. This test is used for clinical purposes. It should not be r egarded as investigational or for research. This laboratory is certified under the Clinical Laboratory Improvement Amendments of 1988 (CLIA-88) as qualified to perform high complexity clinical laboratory testing.  The controls stained appropriately.   Veritor Flu A/B Waived     Status: None   Collection Time: 01/12/24 12:09 PM  Result Value Ref Range   Influenza A Negative Negative   Influenza B Negative Negative    Comment: If the test is negative for the presence of influenza A or influenza B antigen, infection due to influenza cannot be ruled-out because the antigen present in the sample may be below the detection limit of the test. It is recommended that these results be confirmed by viral culture or an FDA-cleared influenza A and B molecular assay.   Novel Coronavirus, NAA ([facility])     Status: None   Collection Time: 01/12/24  4:57 PM   Specimen: Nasopharyngeal(NP) swabs in vial transport medium  Result Value Ref Range   SARS-CoV-2, NAA Not Detected Not Detected  Comment: This nucleic acid amplification test was developed and its performance characteristics determined by [facility]. Nucleic acid amplification tests include RT-PCR and TMA. This test has not been FDA cleared or approved. This test has been authorized by FDA under an Emergency Use Authorization (EUA). This test is only authorized for the duration of time the declaration that circumstances exist justifying the authorization of the emergency use of in vitro diagnostic tests for detection of SARS-CoV-2 virus and/or diagnosis of COVID-19 infection under section 564(b)(1) of the Act, 21 U.S.C. 639aaa-6(a) (1), unless the authorization is terminated or revoked sooner. When diagnostic testing is negative, the possibility of a false negative result should be considered in the context of a  patient's recent exposures and the presence of clinical signs and symptoms consistent with COVID-19. An individual without symptoms of COVID-19 and who is not shedding SARS-CoV-2 virus wo uld expect to have a negative (not detected) result in this assay.   Basic Metabolic Panel     Status: None   Collection Time: 02/16/24  9:58 AM  Result Value Ref Range   Glucose 85 70 - 99 mg/dL   BUN 10 8 - 27 mg/dL   Creatinine, Ser 9.14 0.57 - 1.00 mg/dL   eGFR 72 >40 fO/fpw/8.26   BUN/Creatinine Ratio 12 12 - 28   Sodium 138 134 - 144 mmol/L   Potassium 4.6 3.5 - 5.2 mmol/L   Chloride 102 96 - 106 mmol/L   CO2 25 20 - 29 mmol/L   Calcium  9.9 8.7 - 10.3 mg/dL  Vitamin D , 25-hydroxy     Status: None   Collection Time: 02/16/24  9:58 AM  Result Value Ref Range   Vit D, 25-Hydroxy 81.5 30.0 - 100.0 ng/mL    Comment: Vitamin D  deficiency has been defined by the Institute of Medicine and an Endocrine Society practice guideline as a level of serum 25-OH vitamin D  less than 20 ng/mL (1,2). The Endocrine Society went on to further define vitamin D  insufficiency as a level between 21 and 29 ng/mL (2). 1. IOM (Institute of Medicine). 2010. Dietary reference    intakes for calcium  and D. [place]: The    [employer]. 2. Holick MF, Binkley [place], Bischoff-Ferrari HA, et al.    Evaluation, treatment, and prevention of vitamin D     deficiency: an Endocrine Society clinical practice    guideline. JCEM. 2011 Jul; 96(7):1911-30.      Assessment/ Plan: [name] here for annual physical exam.   Annual physical exam  Osteoporosis of multiple sites  Pure hypercholesterolemia - Plan: rosuvastatin  (CRESTOR ) 5 MG tablet, Lipid panel, Hepatic function panel  Family history of heart disease - Plan: rosuvastatin  (CRESTOR ) 5 MG tablet  Malnutrition of moderate degree  Adult BMI <19 kg/sq m   She wants to hold off on shingles vaccination given holiday tomorrow and I think this is  reasonable.   On Reclast  for osteoporosis.  Vitamin D  within normal range and calcium  level normal on recent lab draw  Has been noncompliant with Zetia .  Discussed that the likelihood of this causing chest pain is extremely unlikely and if anything gets there to prevent her from having a cardiac event.  Reinforced need for compliance with cholesterol medication, particular given strong family history of cardiovascular disease.  I counseled her on balanced diet but I certainly would not be restrictive as she is underweight we discussed that this is a high risk comorbidity for heart disease  She wants to resume  rosuvastatin  so this was ordered.  Future order for lipid panel and liver function panel placed.  Regarding her abdominal pain, agree with follow-up with gastroenterology.  I discussed with her that whilst her integrative specialist may be testing her for parasites, these are not showing up on our lab test and I will not be empirically treating her for anything I do not have objective evidence of.  This would be not be in line with practicing evidence-based medicine.  Counseled on healthy lifestyle choices, including diet (rich in fruits, vegetables and lean meats and low in salt and simple carbohydrates) and exercise (at least 30 minutes of moderate physical activity daily).  Patient to follow up 30m for lipid labs.  Rivaldo Hineman M. Jolinda, DO

## 2024-02-26 DIAGNOSIS — L03031 Cellulitis of right toe: Secondary | ICD-10-CM | POA: Diagnosis not present

## 2024-02-26 DIAGNOSIS — B351 Tinea unguium: Secondary | ICD-10-CM | POA: Diagnosis not present

## 2024-02-26 DIAGNOSIS — M79674 Pain in right toe(s): Secondary | ICD-10-CM | POA: Diagnosis not present

## 2024-03-11 ENCOUNTER — Ambulatory Visit: Payer: Self-pay

## 2024-03-11 NOTE — Telephone Encounter (Signed)
 FYI Only or Action Required?: FYI only for provider: Pt states she will call her urologist for care. Pt did not wish to continue triage. She thinks this is her cystitis flaring up.  Patient was last seen in primary care on 02/21/2024 by Jolinda Norene HERO, DO.  Called Nurse Triage reporting Dysuria.  Symptoms began a week ago.  Interventions attempted: Other: unknown.  Symptoms are: Unknown.  Triage Disposition: See Physician Within 24 Hours  Patient/caregiver understands and will follow disposition?: Yes                        Summary: possible UTI burning and urgency and slow stream when urinate   Reason for Triage: possible UTI burning and urgency and slow stream when urinate       Reason for Disposition  Age > 50 years  Answer Assessment - Initial Assessment Questions 1. SEVERITY: How bad is the pain?  (e.g., Scale 1-10; mild, moderate, or severe)     No -  2. FREQUENCY: How many times have you had painful urination today?      no 3. PATTERN: Is pain present every time you urinate or just sometimes?      *No Answer* 4. ONSET: When did the painful urination start?      1 week 5. FEVER: Do you have a fever? If Yes, ask: What is your temperature, how was it measured, and when did it start?     *No Answer* 6. PAST UTI: Have you had a urine infection before? If Yes, ask: When was the last time? and What happened that time?      7. CAUSE: What do you think is causing the painful urination?  (e.g., UTI, scratch, Herpes sore)     Pt thinks this is her cystitis acting up. 8. OTHER SYMPTOMS: Do you have any other symptoms? (e.g., blood in urine, flank pain, genital sores, urgency, vaginal discharge)     *No Answer* 9. PREGNANCY: Is there any chance you are pregnant? When was your last menstrual period?     *No Answer*  Protocols used: Urination Pain - Female-A-AH

## 2024-03-12 DIAGNOSIS — B351 Tinea unguium: Secondary | ICD-10-CM | POA: Diagnosis not present

## 2024-03-12 DIAGNOSIS — M79675 Pain in left toe(s): Secondary | ICD-10-CM | POA: Diagnosis not present

## 2024-03-12 DIAGNOSIS — M79674 Pain in right toe(s): Secondary | ICD-10-CM | POA: Diagnosis not present

## 2024-03-12 NOTE — Telephone Encounter (Signed)
Noted  -LS

## 2024-03-14 ENCOUNTER — Ambulatory Visit: Payer: Self-pay

## 2024-03-14 DIAGNOSIS — N3001 Acute cystitis with hematuria: Secondary | ICD-10-CM | POA: Diagnosis not present

## 2024-03-14 NOTE — Telephone Encounter (Signed)
 FYI Only or Action Required?: FYI only for provider: No appts today, advised UC.  Patient was last seen in primary care on 02/21/2024 by Jolinda Norene HERO, DO.  Called Nurse Triage reporting Dysuria.  Symptoms began a week ago.  Interventions attempted: OTC medications: Tylenol  and Azo.  Symptoms are: gradually worsening.  Triage Disposition: See HCP Within 4 Hours (Or PCP Triage)  Patient/caregiver understands and will follow disposition?: Yes   Onset of 8/10 burning with urination and at rest x1 week. Improved some after taking ibuprofen  and azo. Denies blood in urine or fever or frequency. Mild new onset pain in lower back. No appts today in pt region, advised UC today, pt planning to go.     Copied from CRM #[id number]. Topic: Clinical - Red Word Triage >> Mar 14, 2024  2:34 PM Roselie BROCKS wrote: Red Word that prompted transfer to Nurse Triage: Patient thinks she has a UTI, has really bad burning and pain with urination, very painful in the back. Reason for Disposition  Side (flank) or lower back pain present  Answer Assessment - Initial Assessment Questions 1. SEVERITY: How bad is the pain?  (e.g., Scale 1-10; mild, moderate, or severe)     Constant severe 8/10 burning pain  2. FREQUENCY: How many times have you had painful urination today?      Denies frequent urination  3. PATTERN: Is pain present every time you urinate or just sometimes?      Constant  4. ONSET: When did the painful urination start?      1 week ago  5. FEVER: Do you have a fever? If Yes, ask: What is your temperature, how was it measured, and when did it start?     Denies  6. PAST UTI: Have you had a urine infection before? If Yes, ask: When was the last time? and What happened that time?      Yes, took abxs  7. CAUSE: What do you think is causing the painful urination?  (e.g., UTI, scratch, Herpes sore)     UTI  8. OTHER SYMPTOMS: Do you have any other symptoms? (e.g.,  blood in urine, flank pain, genital sores, urgency, vaginal discharge)     Pain in low back, nervousness  Protocols used: Urination Pain - Female-A-AH

## 2024-03-14 NOTE — Telephone Encounter (Signed)
Noted  -LS

## 2024-03-19 ENCOUNTER — Encounter: Payer: Self-pay | Admitting: Family Medicine

## 2024-03-19 ENCOUNTER — Ambulatory Visit: Admitting: Family Medicine

## 2024-03-19 VITALS — BP 129/83 | HR 83 | Temp 98.2°F | Ht 69.0 in | Wt 120.2 lb

## 2024-03-19 DIAGNOSIS — N898 Other specified noninflammatory disorders of vagina: Secondary | ICD-10-CM | POA: Diagnosis not present

## 2024-03-19 DIAGNOSIS — Z87440 Personal history of urinary (tract) infections: Secondary | ICD-10-CM | POA: Diagnosis not present

## 2024-03-19 LAB — URINALYSIS, ROUTINE W REFLEX MICROSCOPIC
Bilirubin, UA: NEGATIVE
Glucose, UA: NEGATIVE
Ketones, UA: NEGATIVE
Leukocytes,UA: NEGATIVE
Nitrite, UA: POSITIVE — AB
Protein,UA: NEGATIVE
Urobilinogen, Ur: 0.2 mg/dL (ref 0.2–1.0)
pH, UA: 6.5 (ref 5.0–7.5)

## 2024-03-19 LAB — MICROSCOPIC EXAMINATION
Bacteria, UA: NONE SEEN
RBC, Urine: NONE SEEN /HPF (ref 0–2)
Renal Epithel, UA: NONE SEEN /HPF
Yeast, UA: NONE SEEN

## 2024-03-19 LAB — WET PREP FOR TRICH, YEAST, CLUE
Clue Cell Exam: NEGATIVE
Trichomonas Exam: NEGATIVE
Yeast Exam: NEGATIVE

## 2024-03-19 NOTE — Progress Notes (Signed)
 "  Subjective: CC:UTI PCP: Jolinda Kristy HERO, DO YEP:Kristy Garza is a 73 y.o. female presenting to clinic today for:  Patient here for interval follow-up on urinary tract infection.  She was seen at urgent care on 03/13/2024 and found to have low levels of leukocytes and nitrite positive.  Urine was apparently sent for culture but these results are not visible in EMR.  She was treated with a dose of fosfomycin .  Today she reports that she did not have resolution in symptoms after use of the Monurol .  She reports ongoing dysuria even when she is not urinating, vaginal dryness and irritation.  Does not report any discharge.  No flank pain or fevers reported.  She did take a Macrobid  that she had leftover and felt a little bit better on that.  Has not yet contacted her urologist.  She is compliant with Estrace  cream vaginally.   ROS: Per HPI  Allergies[1] Past Medical History:  Diagnosis Date   Abdominal pain 01/16/2013   Acid reflux    Allergy     Arthritis    ARTHRITIS IN NECK BY DR. MERILEE ISSAC   Asthma    Fibroids    Interstitial cystitis    Osteoporosis    PONV (postoperative nausea and vomiting)    Tinnitus    Trigeminal neuralgia    Atypical trigeminal neuralgia   Current Medications[2] Social History   Socioeconomic History   Marital status: Married    Spouse name: [name]   Number of children: 3   Years of education: 12   Highest education level: Some college, no degree  Occupational History   Occupation: CNA    Comment: Retired  Tobacco Use   Smoking status: Never   Smokeless tobacco: Never  Vaping Use   Vaping status: Never Used  Substance and Sexual Activity   Alcohol use: No    Alcohol/week: 0.0 standard drinks of alcohol    Comment: 01-19-2016 per pt no   Drug use: No    Comment: 01-19-2016 per pt no    Sexual activity: Not Currently    Birth control/protection: Surgical    Comment: hyst  Other Topics Concern   Not on file  Social History  Narrative   Lives with husband.    Has 2 daughters and a son.   Both parents have passed away   Sees an integrative specialist occasionally   Social Drivers of Health   Tobacco Use: Low Risk (03/14/2024)   Received from [facility]   Patient History    Smoking Tobacco Use: Never    Smokeless Tobacco Use: Never    Passive Exposure: Never  Financial Resource Strain: Low Risk (01/23/2024)   Overall Financial Resource Strain (CARDIA)    Difficulty of Paying Living Expenses: Not hard at all  Food Insecurity: No Food Insecurity (03/14/2024)   Received from [facility]   Epic    Within the past 12 months, you worried that your food would run out before you got the money to buy more.: Never true    Within the past 12 months, the food you bought just didn't last and you didn't have money to get more.: Never true  Transportation Needs: No Transportation Needs (03/14/2024)   Received from [facility] - Transportation    Lack of Transportation (Medical): No    Lack of Transportation (Non-Medical): No  Physical Activity: Inactive (01/23/2024)   Exercise Vital Sign    Days of Exercise per Week:  0 days    Minutes of Exercise per Session: 0 min  Stress: No Stress Concern Present (01/23/2024)   [employer] - Occupational Stress Questionnaire    Feeling of Stress: Only a little  Social Connections: Moderately Isolated (01/23/2024)   Social Connection and Isolation Panel    Frequency of Communication with Friends and Family: Twice a week    Frequency of Social Gatherings with Friends and Family: Twice a week    Attends Religious Services: Never    Database Administrator or Organizations: No    Attends Banker Meetings: Never    Marital Status: Married  Catering Manager Violence: Not At Risk (01/23/2024)   Epic    Fear of Current or Ex-Partner: No    Emotionally Abused: No    Physically Abused: No    Sexually Abused: No   Depression (PHQ2-9): Low Risk (02/21/2024)   Depression (PHQ2-9)    PHQ-2 Score: 3  Alcohol Screen: Low Risk (01/23/2024)   Alcohol Screen    Last Alcohol Screening Score (AUDIT): 0  Housing: Low Risk (01/23/2024)   Epic    Unable to Pay for Housing in the Last Year: No    Number of Times Moved in the Last Year: 0    Homeless in the Last Year: No  Utilities: Low Risk (03/14/2024)   Received from [facility]   Utilities    Within the past 12 months, have you been unable to get utilities(heat, electricity) when it was really needed?: No  Health Literacy: Adequate Health Literacy (01/23/2024)   B1300 Health Literacy    Frequency of need for help with medical instructions: Never   Family History  Problem Relation Age of Onset   Hyperlipidemia Mother    Arthritis Mother    Cancer Mother    Lymphoma Mother    Cancer Father    Allergies Father    Allergies Sister    Allergies Sister    Allergic rhinitis Neg Hx    Angioedema Neg Hx    Asthma Neg Hx    Eczema Neg Hx    Immunodeficiency Neg Hx    Urticaria Neg Hx     Objective: Office vital signs reviewed. BP 129/83   Pulse 83   Temp 98.2 F (36.8 C)   Ht 5' 9 (1.753 m)   Wt 120 lb 4 oz (54.5 kg)   SpO2 95%   BMI 17.76 kg/m   Physical Examination:  General: Awake, alert, thin female, No acute distress GU: Suprapubic tenderness palpation present.  No CVA tenderness palpation  Assessment/ Plan: 73 y.o. female   Recent urinary tract infection - Plan: Urinalysis, Routine w reflex microscopic, Urine Culture, WET PREP FOR TRICH, YEAST, CLUE  Vaginal irritation - Plan: WET PREP FOR TRICH, YEAST, CLUE   Advised to follow-up with her urologist given recurrent issues.  I wonder if perhaps she would benefit from a daily prophylactic antibiotic.  Will recheck urine and add culture as this was not visualized in the EMR and she is having persistent symptoms.  Also check wet prep for any evidence of yeast.   Kristy CHRISTELLA Fielding, DO [facility] [phone]     [1]  Allergies Allergen Reactions   Cefuroxime  Axetil Other (See Comments)    Extreme gas   Gabapentin  Other (See Comments)    Constipation Constipation   Montelukast  Other (See Comments)    Numbness and tingling in hand. Numbness and tingling in  hand. Numbness and tingling in hand. NUMBNESS OF HANDS AND FEET. Numbness in hands and feet NUMBNESS OF HANDS AND FEET.   Pregabalin Other (See Comments)    Drowsiness, dry mouth  Makes her tired and thirsty   Augmentin  [Amoxicillin -Pot Clavulanate] Diarrhea   Avelox [Moxifloxacin Hcl In Nacl] Other (See Comments)    Tremors    Biaxin  [Clarithromycin ]     Unsure of reaction   Cedax [Ceftibuten] Other (See Comments)    tremors   Ciprofloxacin  Other (See Comments) and Nausea And Vomiting    Blurred vision Blurred vision Pt can't remember reaction   Clindamycin/Lincomycin     tachycardia   Doxycycline  Diarrhea    Nausea , stomach upset   Levofloxacin Other (See Comments)    Feels dehydrated   Lincomycin Other (See Comments)    tachycardia   Montelukast  Sodium Other (See Comments)    Numbness and tingling in hand. Numbness and tingling in hand.   Sulfonamide Derivatives Other (See Comments)    Gi upset   Sulfa Antibiotics Nausea Only and Other (See Comments)    Other reaction(s): Other (See Comments)  Gi upset  Other reaction(s): Unknown  [2]  Current Outpatient Medications:    acetaminophen  (TYLENOL ) 500 MG tablet, Take by mouth., Disp: , Rfl:    albuterol  (VENTOLIN  HFA) 108 (90 Base) MCG/ACT inhaler, TAKE 2 PUFFS BY MOUTH EVERY 6 HOURS AS NEEDED FOR WHEEZE OR SHORTNESS OF BREATH, Disp: 18 each, Rfl: 0   ARNUITY ELLIPTA  200 MCG/ACT AEPB, TAKE 1 PUFF BY MOUTH EVERY DAY (Patient taking differently: As needed), Disp: 30 each, Rfl: 5   Aspirin-Acetaminophen -Caffeine  (EXCEDRIN EXTRA STRENGTH PO), Take by mouth., Disp: , Rfl:    azelastine  (ASTELIN ) 0.1  % nasal spray, Place 2 sprays into both nostrils 2 (two) times daily. Use in each nostril as directed, Disp: 30 mL, Rfl: 12   b complex vitamins capsule, Take 1 capsule by mouth daily., Disp: , Rfl:    Cholecalciferol (VITAMIN D3) 125 MCG (5000 UT) CAPS, Take 5,000 Units by mouth daily., Disp: , Rfl:    diphenhydrAMINE  HCl (BENADRYL  PO), Take by mouth., Disp: , Rfl:    Docusate Sodium  (COLACE PO), Take by mouth., Disp: , Rfl:    estradiol  (ESTRACE  VAGINAL) 0.1 MG/GM vaginal cream, Apply 0.5 grams vaginally 3 days per week., Disp: 42.5 g, Rfl: 12   famotidine  (PEPCID ) 20 MG tablet, Take 1 tablet (20 mg total) by mouth 2 (two) times daily as needed for heartburn or indigestion., Disp: 180 tablet, Rfl: 1   guaiFENesin  (MUCINEX  PO), Take by mouth., Disp: , Rfl:    Lidocaine -Collagen-Aloe Vera (REGENECARE) 2 % GEL, Apply topically daily., Disp: 85 g, Rfl: 2   Magnesium Hydroxide (MILK OF MAGNESIA PO), Take by mouth., Disp: , Rfl:    MAGNESIUM PO, Take by mouth., Disp: , Rfl:    melatonin 5 MG TABS, Take 5 mg by mouth., Disp: , Rfl:    mometasone  (NASONEX ) 50 MCG/ACT nasal spray, Place 2 sprays into the nose daily., Disp: 17 g, Rfl: 12   ondansetron  (ZOFRAN ) 4 MG tablet, Take 1 tablet (4 mg total) by mouth 3 (three) times daily as needed for nausea or vomiting., Disp: 20 tablet, Rfl: 1   Probiotic Product (PROBIOTIC PO), Take by mouth daily., Disp: , Rfl:    rosuvastatin  (CRESTOR ) 5 MG tablet, Take 1 tablet (5 mg total) by mouth daily. Wants to replace zetia , Disp: 90 tablet, Rfl: 3   Simethicone (GAS-X PO), Take by mouth.,  Disp: , Rfl:    tizanidine  (ZANAFLEX ) 2 MG capsule, Take 1-2 capsules (2-4 mg total) by mouth 3 (three) times daily as needed for muscle spasms., Disp: 60 capsule, Rfl: 2   traMADol  (ULTRAM ) 50 MG tablet, Take 1 tablet (50 mg total) by mouth every 12 (twelve) hours as needed., Disp: 30 tablet, Rfl: 1  "

## 2024-03-21 LAB — URINE CULTURE: Organism ID, Bacteria: NO GROWTH

## 2024-03-25 ENCOUNTER — Ambulatory Visit: Payer: Medicare HMO

## 2024-03-25 ENCOUNTER — Telehealth: Payer: Self-pay | Admitting: Family Medicine

## 2024-03-25 NOTE — Telephone Encounter (Unsigned)
 Copied from CRM [id number]. Topic: Clinical - Lab/Test Results >> Mar 25, 2024  1:48 PM Suzette B wrote: Reason for CRM: pt is calling to go over lab results please call to advise at [phone]

## 2024-03-25 NOTE — Telephone Encounter (Signed)
 Discussed with pt. LS

## 2024-03-25 NOTE — Telephone Encounter (Signed)
All were negative

## 2024-03-28 ENCOUNTER — Telehealth: Payer: Self-pay

## 2024-03-28 NOTE — Telephone Encounter (Signed)
 Auth Submission: NO AUTH NEEDED Site of care: Site of care: [facility] Payer: [employer] medicare   Medication & CPT/J Code(s) submitted: Reclast  (Zolendronic acid) [id number] Diagnosis Code:  Route of submission (phone, fax, portal): portal Phone # Fax # Auth type: Buy/Bill PB Units/visits requested: 5mg  x 1 dose Reference number:  Approval from: 03/28/24 to 03/27/25

## 2024-03-29 ENCOUNTER — Other Ambulatory Visit (HOSPITAL_COMMUNITY): Payer: Self-pay

## 2024-04-01 ENCOUNTER — Encounter: Payer: Self-pay | Admitting: Family Medicine

## 2024-04-01 ENCOUNTER — Encounter: Attending: Family Medicine | Admitting: Internal Medicine

## 2024-04-01 ENCOUNTER — Ambulatory Visit: Admitting: Family Medicine

## 2024-04-01 VITALS — BP 122/82 | HR 82 | Temp 97.7°F | Ht 69.0 in | Wt 120.6 lb

## 2024-04-01 VITALS — BP 102/71 | HR 71 | Temp 97.9°F | Resp 16

## 2024-04-01 DIAGNOSIS — N3010 Interstitial cystitis (chronic) without hematuria: Secondary | ICD-10-CM

## 2024-04-01 DIAGNOSIS — R300 Dysuria: Secondary | ICD-10-CM

## 2024-04-01 DIAGNOSIS — M810 Age-related osteoporosis without current pathological fracture: Secondary | ICD-10-CM

## 2024-04-01 LAB — MICROSCOPIC EXAMINATION
Bacteria, UA: NONE SEEN
RBC, Urine: NONE SEEN /HPF (ref 0–2)
Renal Epithel, UA: NONE SEEN /HPF
Yeast, UA: NONE SEEN

## 2024-04-01 LAB — URINALYSIS, ROUTINE W REFLEX MICROSCOPIC
Bilirubin, UA: NEGATIVE
Glucose, UA: NEGATIVE
Ketones, UA: NEGATIVE
Nitrite, UA: NEGATIVE
Protein,UA: NEGATIVE
Specific Gravity, UA: 1.015 (ref 1.005–1.030)
Urobilinogen, Ur: 0.2 mg/dL (ref 0.2–1.0)
pH, UA: 7 (ref 5.0–7.5)

## 2024-04-01 MED ORDER — DIPHENHYDRAMINE HCL 25 MG PO CAPS: 25.0000 mg | ORAL_CAPSULE | Freq: Once | ORAL | Status: DC

## 2024-04-01 MED ORDER — ZOLEDRONIC ACID 5 MG/100ML IV SOLN
5.0000 mg | Freq: Once | INTRAVENOUS | Status: AC
  Administered 2024-04-01: 5 mg via INTRAVENOUS

## 2024-04-01 MED ORDER — ACETAMINOPHEN 325 MG PO TABS: 650.0000 mg | ORAL_TABLET | Freq: Once | ORAL | Status: DC

## 2024-04-01 NOTE — Progress Notes (Signed)
 Diagnosis:  Osteoporosis  Provider:  Jolinda Potter DO  Procedure: IV Infusion  IV Type: Peripheral, IV Location: L Antecubital   Reclast  (Zolendronic Acid), Dose: 5 mg  Infusion Start Time: 1410  Infusion Stop Time: 1442  Post Infusion IV Care: Observation period completed  Discharge: Condition: Good, Destination: Home . AVS Declined  Performed by:  Cadyn Rodger R, LPN

## 2024-04-01 NOTE — Progress Notes (Signed)
" ° °  Acute Office Visit  Subjective:     Patient ID: Kristy Garza, female    DOB: [birth year], 74 y.o.   MRN: [id number]  Chief Complaint  Patient presents with   Dysuria    HPI  History of Present Illness   Kristy Garza is a 74 year old female with chronic bladder issues who presents with a persistent burning sensation after completing antibiotics for a UTI.  Dysuria and periurethral burning - Persistent burning sensation around the urethra since completing antibiotics for UTI - Sensation is constant but has slightly improved since finishing antibiotics two days ago - No associated fever, nausea, vomiting, chills, flank pain, vaginal itching, unusual vaginal discharge - No changes in urinary frequency or urgency - No hematuria  Urinary tract infection treatment and microbiology - Completed a course of amoxicillin  for UTI two days ago - Previously took leftover macrobid  before leaving a urine culture on December 23rd - Urine culture from December 23rd was negative - Urgent care urine culture prior to December 23rd was positive for two types of bacteria, including E. coli - Allergic to many antibiotics, but has tolerated amoxicillin  in the past  Chronic bladder symptoms and urology follow-up - History of chronic IC - Has not seen urologist recently despite attempts to make an appointment  Symptom management - Using Vivacaine and Azo for symptom relief - Trying to maintain adequate hydration       ROS As per HPI.     Objective:    BP 122/82   Pulse 82   Temp 97.7 F (36.5 C) (Temporal)   Ht 5' 9 (1.753 m)   Wt 120 lb 9.6 oz (54.7 kg)   SpO2 97%   BMI 17.81 kg/m    Physical Exam Vitals and nursing note reviewed.  Constitutional:      General: She is not in acute distress.    Appearance: She is not ill-appearing, toxic-appearing or diaphoretic.  Pulmonary:     Effort: Pulmonary effort is normal. No respiratory distress.  Skin:    General: Skin is warm  and dry.  Neurological:     Mental Status: She is alert and oriented to person, place, and time. Mental status is at baseline.  Psychiatric:        Mood and Affect: Mood normal.        Behavior: Behavior normal.     No results found for any visits on 04/01/24.      Assessment & Plan:   Kristy Garza was seen today for dysuria.  Diagnoses and all orders for this visit:  Dysuria -     Urinalysis, Routine w reflex microscopic -     Urine Culture  Chronic interstitial cystitis   Assessment and Plan    Chronic interstitial cystitis Dysuria Persistent symptoms despite recent antibiotic treatment. Previous urine culture negative, but prior culture positive for E. coli and another bacteria. UA is not compelling for UTI. Awaiting current urine culture results. - Ordered urine culture. - Advised follow-up with urologist for chronic bladder issues. - Encouraged increased water  intake.       [name] CHRISTELLA Search, FNP   "

## 2024-04-02 ENCOUNTER — Other Ambulatory Visit: Payer: Self-pay | Admitting: Family Medicine

## 2024-04-02 DIAGNOSIS — N760 Acute vaginitis: Secondary | ICD-10-CM

## 2024-04-03 ENCOUNTER — Ambulatory Visit: Payer: Self-pay | Admitting: Family Medicine

## 2024-04-03 LAB — URINE CULTURE

## 2024-04-17 ENCOUNTER — Encounter: Payer: Self-pay | Admitting: Family Medicine

## 2024-04-17 ENCOUNTER — Ambulatory Visit: Admitting: Family Medicine

## 2024-04-17 VITALS — BP 137/79 | HR 81 | Temp 98.1°F | Ht 68.0 in | Wt 124.4 lb

## 2024-04-17 DIAGNOSIS — J0101 Acute recurrent maxillary sinusitis: Secondary | ICD-10-CM | POA: Diagnosis not present

## 2024-04-17 DIAGNOSIS — N760 Acute vaginitis: Secondary | ICD-10-CM

## 2024-04-17 LAB — VERITOR SARS-COV-2 AND FLU A+B
BD Veritor SARS-CoV-2 Ag: NEGATIVE
Influenza A: NEGATIVE
Influenza B: NEGATIVE

## 2024-04-17 LAB — RSV AG, IMMUNOCHR, WAIVED: RSV Ag, Immunochr, Waived: NEGATIVE

## 2024-04-17 MED ORDER — AMOXICILLIN-POT CLAVULANATE 875-125 MG PO TABS: 1.0000 | ORAL_TABLET | Freq: Two times a day (BID) | ORAL | 0 refills | Status: AC

## 2024-04-17 MED ORDER — FLUCONAZOLE 100 MG PO TABS: ORAL_TABLET | ORAL | 0 refills | Status: AC

## 2024-04-17 NOTE — Progress Notes (Signed)
 "  Subjective: CC: URI PCP: Jolinda Norene HERO, DO YEP:Kristy Garza is a 74 y.o. female presenting to clinic today for:  Patient reports that she has been having ongoing symptoms since January 10.  Started with sore throat and then progressed to dental pain, mild cough and myalgia.  She reports no measured fevers.  She saw urology recently and they diagnosed her with a candidal vulvovaginitis and placed her on topical betazole cream and miconazole.  She still has some urinary symptoms but was negative for UTI there and they did not feel like she was having exacerbation of her IC.  She would like to go ahead and see OB/GYN.  Currently established with [facility] but would like to get a second opinion as she has been previously evaluated for lichen sclerosis but was told that she did not have it.   ROS: Per HPI  Allergies[1] Past Medical History:  Diagnosis Date   Abdominal pain 01/16/2013   Acid reflux    Allergy     Arthritis    ARTHRITIS IN NECK BY DR. MERILEE ISSAC   Asthma    Fibroids    Interstitial cystitis    Osteoporosis    PONV (postoperative nausea and vomiting)    Tinnitus    Trigeminal neuralgia    Atypical trigeminal neuralgia   Current Medications[2] Social History   Socioeconomic History   Marital status: Married    Spouse name: [name]   Number of children: 3   Years of education: 12   Highest education level: Some college, no degree  Occupational History   Occupation: CNA    Comment: Retired  Tobacco Use   Smoking status: Never   Smokeless tobacco: Never  Vaping Use   Vaping status: Never Used  Substance and Sexual Activity   Alcohol use: No    Alcohol/week: 0.0 standard drinks of alcohol    Comment: 01-19-2016 per pt no   Drug use: No    Comment: 01-19-2016 per pt no    Sexual activity: Not Currently    Birth control/protection: Surgical    Comment: hyst  Other Topics Concern   Not on file  Social History Narrative   Lives with  husband.    Has 2 daughters and a son.   Both parents have passed away   Sees an integrative specialist occasionally   Social Drivers of Health   Tobacco Use: Low Risk (04/06/2024)   [facility]   Patient History    Smoking Tobacco Use: Never    Smokeless Tobacco Use: Never    Passive Exposure: Never  Financial Resource Strain: Low Risk (01/23/2024)   Overall Financial Resource Strain (CARDIA)    Difficulty of Paying Living Expenses: Not hard at all  Food Insecurity: No Food Insecurity (03/14/2024)   Received from [facility]   Epic    Within the past 12 months, you worried that your food would run out before you got the money to buy more.: Never true    Within the past 12 months, the food you bought just didn't last and you didn't have money to get more.: Never true  Transportation Needs: No Transportation Needs (03/14/2024)   Received from [facility] - Transportation    Lack of Transportation (Medical): No    Lack of Transportation (Non-Medical): No  Physical Activity: Inactive (01/23/2024)   Exercise Vital Sign    Days of Exercise per Week: 0 days    Minutes of  Exercise per Session: 0 min  Stress: No Stress Concern Present (01/23/2024)   [employer]ational [employer] - Occupational Stress Questionnaire    Feeling of Stress: Only a little  Social Connections: Moderately Isolated (01/23/2024)   Social Connection and Isolation Panel    Frequency of Communication with Friends and Family: Twice a week    Frequency of Social Gatherings with Friends and Family: Twice a week    Attends Religious Services: Never    Database Administrator or Organizations: No    Attends Banker Meetings: Never    Marital Status: Married  Catering Manager Violence: Not At Risk (01/23/2024)   Epic    Fear of Current or Ex-Partner: No    Emotionally Abused: No    Physically Abused: No    Sexually Abused: No  Depression (PHQ2-9): Low Risk  (04/01/2024)   Depression (PHQ2-9)    PHQ-2 Score: 0  Alcohol Screen: Low Risk (01/23/2024)   Alcohol Screen    Last Alcohol Screening Score (AUDIT): 0  Housing: Low Risk (01/23/2024)   Epic    Unable to Pay for Housing in the Last Year: No    Number of Times Moved in the Last Year: 0    Homeless in the Last Year: No  Utilities: Low Risk (03/14/2024)   Received from [facility]   Utilities    Within the past 12 months, have you been unable to get utilities(heat, electricity) when it was really needed?: No  Health Literacy: Adequate Health Literacy (01/23/2024)   B1300 Health Literacy    Frequency of need for help with medical instructions: Never   Family History  Problem Relation Age of Onset   Hyperlipidemia Mother    Arthritis Mother    Cancer Mother    Lymphoma Mother    Cancer Father    Allergies Father    Allergies Sister    Allergies Sister    Allergic rhinitis Neg Hx    Angioedema Neg Hx    Asthma Neg Hx    Eczema Neg Hx    Immunodeficiency Neg Hx    Urticaria Neg Hx     Objective: Office vital signs reviewed. BP 137/79   Pulse 81   Temp 98.1 F (36.7 C)   Ht 5' 8 (1.727 m)   Wt 124 lb 6 oz (56.4 kg)   SpO2 96%   BMI 18.91 kg/m   Physical Examination:  General: Awake, alert, thin elderly female, No acute distress HEENT: Normal. +TTP to maxillary sinuses    Neck: No masses palpated. No lymphadenopathy    Ears: Tympanic membranes intact, normal light reflex, no erythema, no bulging    Eyes: PERRLA, extraocular membranes intact, sclera white    Nose: nasal turbinates moist, clear nasal discharge    Throat: moist mucus membranes, no erythema, no tonsillar exudate.  Airway is patent Cardio: regular rate and rhythm, S1S2 heard, no murmurs appreciated Pulm: clear to auscultation bilaterally, no wheezes, rhonchi or rales; normal work of breathing on room air    Assessment/ Plan: 74 y.o. female   Acute recurrent maxillary sinusitis - Plan: Veritor  SARS-CoV-2 and Flu A+B, RSV Ag, Immunochr, Waived, amoxicillin -clavulanate (AUGMENTIN ) 875-125 MG tablet  Vulvovaginitis - Plan: fluconazole  (DIFLUCAN ) 100 MG tablet, Ambulatory referral to Obstetrics / Gynecology   Viral testing all negative.  Will treat for bacterial sinusitis given duration and progressive of symptoms.  Augmentin  sent as she indicated she is not allergic to this medication.  Diflucan  sent  for any vulvovaginitis that may be associated with antibiotic use.  We discussed that if symptoms are ongoing and/or refractory to treatment she should see ENT again to rule out that she does not have some type of candidal process within her sinuses.   Norene CHRISTELLA Fielding, DO [facility] [phone]     [1]  Allergies Allergen Reactions   Cefuroxime  Axetil Other (See Comments)    Extreme gas   Gabapentin  Other (See Comments)    Constipation Constipation   Montelukast  Other (See Comments)    Numbness and tingling in hand. Numbness and tingling in hand. Numbness and tingling in hand. NUMBNESS OF HANDS AND FEET. Numbness in hands and feet NUMBNESS OF HANDS AND FEET.   Pregabalin Other (See Comments)    Drowsiness, dry mouth  Makes her tired and thirsty   Augmentin  [Amoxicillin -Pot Clavulanate] Diarrhea   Avelox [Moxifloxacin Hcl In Nacl] Other (See Comments)    Tremors    Biaxin  [Clarithromycin ]     Unsure of reaction   Cedax [Ceftibuten] Other (See Comments)    tremors   Ciprofloxacin  Other (See Comments) and Nausea And Vomiting    Blurred vision Blurred vision Pt can't remember reaction   Clindamycin/Lincomycin     tachycardia   Doxycycline  Diarrhea    Nausea , stomach upset   Levofloxacin Other (See Comments)    Feels dehydrated   Lincomycin Other (See Comments)    tachycardia   Montelukast  Sodium Other (See Comments)    Numbness and tingling in hand. Numbness and tingling in hand.   Sulfonamide Derivatives Other (See Comments)     Gi upset   Sulfa Antibiotics Nausea Only and Other (See Comments)    Other reaction(s): Other (See Comments)  Gi upset  Other reaction(s): Unknown  [2]  Current Outpatient Medications:    acetaminophen  (TYLENOL ) 500 MG tablet, Take by mouth., Disp: , Rfl:    albuterol  (VENTOLIN  HFA) 108 (90 Base) MCG/ACT inhaler, TAKE 2 PUFFS BY MOUTH EVERY 6 HOURS AS NEEDED FOR WHEEZE OR SHORTNESS OF BREATH, Disp: 18 each, Rfl: 0   ARNUITY ELLIPTA  200 MCG/ACT AEPB, TAKE 1 PUFF BY MOUTH EVERY DAY, Disp: 30 each, Rfl: 5   Aspirin-Acetaminophen -Caffeine  (EXCEDRIN EXTRA STRENGTH PO), Take by mouth., Disp: , Rfl:    azelastine  (ASTELIN ) 0.1 % nasal spray, Place 2 sprays into both nostrils 2 (two) times daily. Use in each nostril as directed, Disp: 30 mL, Rfl: 12   b complex vitamins capsule, Take 1 capsule by mouth daily., Disp: , Rfl:    Cholecalciferol (VITAMIN D3) 125 MCG (5000 UT) CAPS, Take 5,000 Units by mouth daily., Disp: , Rfl:    diphenhydrAMINE  HCl (BENADRYL  PO), Take by mouth., Disp: , Rfl:    Docusate Sodium  (COLACE PO), Take by mouth., Disp: , Rfl:    estradiol  (ESTRACE  VAGINAL) 0.1 MG/GM vaginal cream, Apply 0.5 grams vaginally 3 days per week., Disp: 42.5 g, Rfl: 12   famotidine  (PEPCID ) 20 MG tablet, Take 1 tablet (20 mg total) by mouth 2 (two) times daily as needed for heartburn or indigestion., Disp: 180 tablet, Rfl: 1   guaiFENesin  (MUCINEX  PO), Take by mouth., Disp: , Rfl:    Lidocaine -Collagen-Aloe Vera (REGENECARE) 2 % GEL, Apply topically daily., Disp: 85 g, Rfl: 2   Magnesium Hydroxide (MILK OF MAGNESIA PO), Take by mouth., Disp: , Rfl:    MAGNESIUM PO, Take by mouth., Disp: , Rfl:    melatonin 5 MG TABS, Take 5 mg by mouth., Disp: ,  Rfl:    mometasone  (NASONEX ) 50 MCG/ACT nasal spray, Place 2 sprays into the nose daily., Disp: 17 g, Rfl: 12   ondansetron  (ZOFRAN ) 4 MG tablet, Take 1 tablet (4 mg total) by mouth 3 (three) times daily as needed for nausea or vomiting., Disp: 20 tablet,  Rfl: 1   Probiotic Product (PROBIOTIC PO), Take by mouth daily., Disp: , Rfl:    rosuvastatin  (CRESTOR ) 5 MG tablet, Take 1 tablet (5 mg total) by mouth daily. Wants to replace zetia , Disp: 90 tablet, Rfl: 3   Simethicone (GAS-X PO), Take by mouth., Disp: , Rfl:    tizanidine  (ZANAFLEX ) 2 MG capsule, Take 1-2 capsules (2-4 mg total) by mouth 3 (three) times daily as needed for muscle spasms., Disp: 60 capsule, Rfl: 2   traMADol  (ULTRAM ) 50 MG tablet, Take 1 tablet (50 mg total) by mouth every 12 (twelve) hours as needed., Disp: 30 tablet, Rfl: 1  "

## 2024-04-25 ENCOUNTER — Ambulatory Visit: Payer: Self-pay

## 2024-04-25 NOTE — Telephone Encounter (Signed)
 Pt advised she ntbs and scheduled her with DOD tomorrow at 11:05.

## 2024-04-25 NOTE — Telephone Encounter (Signed)
" ° ° ° ° ° ° ° °  FYI Only or Action Required?: FYI only for provider:  .  Patient was last seen in primary care on 04/17/2024 by Jolinda Norene HERO, DO.  Called Nurse Triage reporting Sore Throat.  Symptoms began a week ago.  Interventions attempted: Prescription medications: Amoxil .  Symptoms are: unchanged.Seen 1 21 26  in office, finishing antibiotic.Still has sore throat, headache. Now has vaginal irritation, burning. Declines OV. Would like medication called in. Please advise pt.   Triage Disposition: See Physician Within 24 Hours  Patient/caregiver understands and will follow disposition?: No, wishes to speak with PCPReason for Triage: Pt called in stating that she is experiencing burning and irritation in her vaginal area. Pt also is having issues with her allergies. Pt also mentioned that she has been having headaches and a sore throat. This has been ongoing for a week. Denied any other symptoms. Warm transferred to nurse triage.  Reason for Disposition  SEVERE throat pain (e.g., excruciating)  Answer Assessment - Initial Assessment Questions 1. ONSET: When did the throat start hurting? (Hours or days ago)      Last week 2. SEVERITY: How bad is the sore throat? (Scale 1-10; mild, moderate or severe)     6-7 3. STREP EXPOSURE: Has there been any exposure to strep within the past week? If Yes, ask: What type of contact occurred?      no 4.  VIRAL SYMPTOMS: Are there any symptoms of a cold, such as a runny nose, cough, hoarse voice or red eyes?      Headache, stuffy nose 5. FEVER: Do you have a fever? If Yes, ask: What is your temperature, how was it measured, and when did it start?     no 6. PUS ON THE TONSILS: Is there pus on the tonsils in the back of your throat?     no 7. OTHER SYMPTOMS: Do you have any other symptoms? (e.g., difficulty breathing, headache, rash)     Vaginal irritation  8. PREGNANCY: Is there any chance you are pregnant? When was your  last menstrual period?     no  Protocols used: Sore Throat-A-AH  "

## 2024-04-26 ENCOUNTER — Encounter: Payer: Self-pay | Admitting: Family Medicine

## 2024-04-26 ENCOUNTER — Ambulatory Visit: Admitting: Family Medicine

## 2024-04-26 ENCOUNTER — Ambulatory Visit: Admitting: Adult Health

## 2024-04-26 ENCOUNTER — Ambulatory Visit

## 2024-04-26 VITALS — BP 139/87 | HR 84 | Temp 97.8°F | Ht 68.0 in | Wt 123.0 lb

## 2024-04-26 DIAGNOSIS — J0101 Acute recurrent maxillary sinusitis: Secondary | ICD-10-CM

## 2024-04-26 DIAGNOSIS — N760 Acute vaginitis: Secondary | ICD-10-CM | POA: Diagnosis not present

## 2024-04-26 NOTE — Patient Instructions (Addendum)
 Saline Nasal Spray Tylenol  Sore Throat [facility]    [facility] [address] [place] [address] [phone]

## 2024-05-23 ENCOUNTER — Other Ambulatory Visit

## 2024-05-23 ENCOUNTER — Ambulatory Visit

## 2024-07-02 ENCOUNTER — Ambulatory Visit: Admitting: Allergy and Immunology

## 2025-04-01 ENCOUNTER — Ambulatory Visit
